# Patient Record
Sex: Female | Born: 1955 | Race: Black or African American | Hispanic: No | Smoking: Former smoker
Health system: Southern US, Community
[De-identification: ages and names within clinical notes are randomized; demographics above are authoritative.]

## PROBLEM LIST (undated history)

## (undated) DIAGNOSIS — Z89611 Acquired absence of right leg above knee: Secondary | ICD-10-CM

## (undated) DIAGNOSIS — E079 Disorder of thyroid, unspecified: Secondary | ICD-10-CM

## (undated) DIAGNOSIS — L97514 Non-pressure chronic ulcer of other part of right foot with necrosis of bone: Secondary | ICD-10-CM

## (undated) DIAGNOSIS — I509 Heart failure, unspecified: Secondary | ICD-10-CM

## (undated) DIAGNOSIS — N186 End stage renal disease: Secondary | ICD-10-CM

## (undated) DIAGNOSIS — K219 Gastro-esophageal reflux disease without esophagitis: Secondary | ICD-10-CM

## (undated) DIAGNOSIS — I639 Cerebral infarction, unspecified: Secondary | ICD-10-CM

## (undated) DIAGNOSIS — N390 Urinary tract infection, site not specified: Secondary | ICD-10-CM

## (undated) DIAGNOSIS — G4700 Insomnia, unspecified: Secondary | ICD-10-CM

## (undated) DIAGNOSIS — I2089 Other forms of angina pectoris: Secondary | ICD-10-CM

## (undated) DIAGNOSIS — D696 Thrombocytopenia, unspecified: Secondary | ICD-10-CM

## (undated) DIAGNOSIS — H109 Unspecified conjunctivitis: Secondary | ICD-10-CM

## (undated) DIAGNOSIS — I2510 Atherosclerotic heart disease of native coronary artery without angina pectoris: Secondary | ICD-10-CM

## (undated) DIAGNOSIS — N3289 Other specified disorders of bladder: Secondary | ICD-10-CM

## (undated) DIAGNOSIS — E119 Type 2 diabetes mellitus without complications: Secondary | ICD-10-CM

## (undated) DIAGNOSIS — T8859XA Other complications of anesthesia, initial encounter: Secondary | ICD-10-CM

## (undated) DIAGNOSIS — M720 Palmar fascial fibromatosis [Dupuytren]: Secondary | ICD-10-CM

## (undated) DIAGNOSIS — Z95828 Presence of other vascular implants and grafts: Secondary | ICD-10-CM

## (undated) DIAGNOSIS — I208 Other forms of angina pectoris: Secondary | ICD-10-CM

## (undated) DIAGNOSIS — E039 Hypothyroidism, unspecified: Secondary | ICD-10-CM

## (undated) DIAGNOSIS — T4145XA Adverse effect of unspecified anesthetic, initial encounter: Secondary | ICD-10-CM

## (undated) DIAGNOSIS — Z9889 Other specified postprocedural states: Secondary | ICD-10-CM

## (undated) DIAGNOSIS — I82409 Acute embolism and thrombosis of unspecified deep veins of unspecified lower extremity: Secondary | ICD-10-CM

## (undated) DIAGNOSIS — D649 Anemia, unspecified: Secondary | ICD-10-CM

## (undated) DIAGNOSIS — E1129 Type 2 diabetes mellitus with other diabetic kidney complication: Secondary | ICD-10-CM

## (undated) DIAGNOSIS — I731 Thromboangiitis obliterans [Buerger's disease]: Secondary | ICD-10-CM

## (undated) DIAGNOSIS — E1152 Type 2 diabetes mellitus with diabetic peripheral angiopathy with gangrene: Secondary | ICD-10-CM

## (undated) DIAGNOSIS — D494 Neoplasm of unspecified behavior of bladder: Secondary | ICD-10-CM

## (undated) DIAGNOSIS — I10 Essential (primary) hypertension: Secondary | ICD-10-CM

## (undated) DIAGNOSIS — E1165 Type 2 diabetes mellitus with hyperglycemia: Secondary | ICD-10-CM

## (undated) DIAGNOSIS — E785 Hyperlipidemia, unspecified: Secondary | ICD-10-CM

## (undated) DIAGNOSIS — Z992 Dependence on renal dialysis: Secondary | ICD-10-CM

## (undated) DIAGNOSIS — I96 Gangrene, not elsewhere classified: Secondary | ICD-10-CM

## (undated) DIAGNOSIS — R112 Nausea with vomiting, unspecified: Secondary | ICD-10-CM

## (undated) HISTORY — DX: Atherosclerotic heart disease of native coronary artery without angina pectoris: I25.10

## (undated) HISTORY — PX: OTHER SURGICAL HISTORY: SHX169

## (undated) HISTORY — PX: CAROTID ENDARTERECTOMY: SUR193

## (undated) HISTORY — DX: Heart failure, unspecified: I50.9

## (undated) HISTORY — PX: CHOLECYSTECTOMY: SHX55

## (undated) HISTORY — DX: Hyperlipidemia, unspecified: E78.5

## (undated) HISTORY — DX: Disorder of thyroid, unspecified: E07.9

## (undated) HISTORY — DX: Acute embolism and thrombosis of unspecified deep veins of unspecified lower extremity: I82.409

## (undated) HISTORY — DX: Thrombocytopenia, unspecified: D69.6

## (undated) HISTORY — PX: FOOT AMPUTATION: SHX951

## (undated) HISTORY — DX: Essential (primary) hypertension: I10

## (undated) HISTORY — DX: Type 2 diabetes mellitus without complications: E11.9

## (undated) HISTORY — DX: Anemia, unspecified: D64.9

## (undated) HISTORY — DX: Urinary tract infection, site not specified: N39.0

---

## 1998-08-24 ENCOUNTER — Emergency Department (HOSPITAL_COMMUNITY): Admission: EM | Admit: 1998-08-24 | Discharge: 1998-08-24 | Payer: Self-pay | Admitting: Emergency Medicine

## 1998-08-25 ENCOUNTER — Emergency Department (HOSPITAL_COMMUNITY): Admission: EM | Admit: 1998-08-25 | Discharge: 1998-08-25 | Payer: Self-pay | Admitting: Emergency Medicine

## 1999-07-22 ENCOUNTER — Inpatient Hospital Stay (HOSPITAL_COMMUNITY): Admission: EM | Admit: 1999-07-22 | Discharge: 1999-07-23 | Payer: Self-pay | Admitting: Emergency Medicine

## 1999-07-22 ENCOUNTER — Encounter: Payer: Self-pay | Admitting: Emergency Medicine

## 1999-07-22 ENCOUNTER — Inpatient Hospital Stay (HOSPITAL_COMMUNITY): Admission: EM | Admit: 1999-07-22 | Discharge: 1999-07-22 | Payer: Self-pay | Admitting: Obstetrics

## 2010-04-12 HISTORY — PX: CORONARY ARTERY BYPASS GRAFT: SHX141

## 2013-11-07 HISTORY — PX: AXILLARY ARTERY - BRACHIAL ARTERY BYPASS GRAFT: SHX893

## 2013-11-13 ENCOUNTER — Ambulatory Visit: Payer: Self-pay | Admitting: Internal Medicine

## 2013-11-30 ENCOUNTER — Other Ambulatory Visit: Payer: Self-pay | Admitting: *Deleted

## 2013-11-30 DIAGNOSIS — Z01810 Encounter for preprocedural cardiovascular examination: Secondary | ICD-10-CM

## 2013-11-30 DIAGNOSIS — N184 Chronic kidney disease, stage 4 (severe): Secondary | ICD-10-CM

## 2013-12-06 ENCOUNTER — Encounter: Payer: Self-pay | Admitting: Vascular Surgery

## 2013-12-12 ENCOUNTER — Encounter: Payer: Self-pay | Admitting: Vascular Surgery

## 2013-12-13 ENCOUNTER — Encounter (HOSPITAL_COMMUNITY): Payer: Self-pay

## 2013-12-13 ENCOUNTER — Ambulatory Visit: Payer: Self-pay | Admitting: Vascular Surgery

## 2013-12-13 ENCOUNTER — Other Ambulatory Visit (HOSPITAL_COMMUNITY): Payer: Self-pay

## 2013-12-14 ENCOUNTER — Ambulatory Visit (HOSPITAL_COMMUNITY)
Admission: RE | Admit: 2013-12-14 | Discharge: 2013-12-14 | Disposition: A | Discharge status: Home / Self Care | Payer: Medicaid Other | Source: Ambulatory Visit | Attending: Surgery | Admitting: Surgery

## 2013-12-14 ENCOUNTER — Encounter (HOSPITAL_COMMUNITY): Payer: Self-pay

## 2013-12-14 ENCOUNTER — Encounter: Payer: Self-pay | Admitting: Surgery

## 2013-12-14 ENCOUNTER — Ambulatory Visit (INDEPENDENT_AMBULATORY_CARE_PROVIDER_SITE_OTHER): Payer: Self-pay | Admitting: Surgery

## 2013-12-14 ENCOUNTER — Other Ambulatory Visit (HOSPITAL_COMMUNITY): Payer: Self-pay

## 2013-12-14 ENCOUNTER — Ambulatory Visit (INDEPENDENT_AMBULATORY_CARE_PROVIDER_SITE_OTHER)
Admission: RE | Admit: 2013-12-14 | Discharge: 2013-12-14 | Disposition: A | Discharge status: Home / Self Care | Payer: Medicaid Other | Source: Ambulatory Visit | Attending: Vascular Surgery | Admitting: Vascular Surgery

## 2013-12-14 ENCOUNTER — Ambulatory Visit: Payer: Self-pay | Admitting: Vascular Surgery

## 2013-12-14 VITALS — BP 182/82 | HR 74 | Ht 66.0 in | Wt 124.7 lb

## 2013-12-14 DIAGNOSIS — Z992 Dependence on renal dialysis: Secondary | ICD-10-CM

## 2013-12-14 DIAGNOSIS — I999 Unspecified disorder of circulatory system: Secondary | ICD-10-CM | POA: Insufficient documentation

## 2013-12-14 DIAGNOSIS — N184 Chronic kidney disease, stage 4 (severe): Secondary | ICD-10-CM | POA: Diagnosis not present

## 2013-12-14 DIAGNOSIS — N186 End stage renal disease: Secondary | ICD-10-CM

## 2013-12-14 DIAGNOSIS — Z01810 Encounter for preprocedural cardiovascular examination: Secondary | ICD-10-CM | POA: Diagnosis present

## 2013-12-14 DIAGNOSIS — I7789 Other specified disorders of arteries and arterioles: Secondary | ICD-10-CM | POA: Insufficient documentation

## 2013-12-14 HISTORY — DX: Dependence on renal dialysis: Z99.2

## 2013-12-14 HISTORY — DX: End stage renal disease: N18.6

## 2013-12-14 NOTE — Progress Notes (Signed)
Patient name: Brandi Arellano MRN: [id number] DOB: November 05, 1955 Sex: female   Referred by: Dr. Romie Levee  Reason for referral:  Chief Complaint  Patient presents with  . New Evaluation    eval Lt arm s/p axillary BP - new perm access     HISTORY OF PRESENT ILLNESS: 58 yo with ESRD, on HD about 2 months ago.  She is right handed.  She had a catheter placed in [place].  She now has dialysis on Tuesday Thursday Saturday.  While at [facility], she had a right axillary to brachial artery bypass graft with propatent external ring graft.  She is found to have very small arteries.  Her renal failure is secondary to diabetes.  She also suffers from hypertension.  Past Medical History  Diagnosis Date  . CAD (coronary artery disease)   . Hypertension   . Diabetes mellitus without complication     Type II  . CKD (chronic kidney disease) stage 4, GFR 15-29 ml/min   . Hyperlipidemia   . CHF (congestive heart failure)     Acute on chronic diastolic  . UTI (lower urinary tract infection)   . Thyroid disease     Abnormal Thyroid function Test  . Anemia   . DVT (deep venous thrombosis)   . Thrombocytopenia     Past Surgical History  Procedure Laterality Date  . Coronary artery bypass graft    . Cholecystectomy    . Carotid endarterectomy      Bilateral  . Foot amputation Bilateral     History   Social History  . Marital Status: Divorced    Spouse Name: N/A    Number of Children: N/A  . Years of Education: N/A   Occupational History  . Not on file.   Social History Main Topics  . Smoking status: Former Smoker -- 15 years    Types: Cigarettes  . Smokeless tobacco: Never Used  . Alcohol Use: No  . Drug Use: No  . Sexual Activity: Not on file   Other Topics Concern  . Not on file   Social History Narrative  . No narrative on file    Family History  Problem Relation Age of Onset  . Hypertension Father   . Heart disease Father     Coronary Artery Disease    Allergies  as of 12/14/2013 - Review Complete 12/14/2013  Allergen Reaction Noted  . Atenolol  12/06/2013  . Diphenhydramine Nausea And Vomiting 12/06/2013  . Iodinated diagnostic agents  12/06/2013  . Metoprolol  12/06/2013  . Morphine and related  12/06/2013  . Thiopental  12/06/2013  . Tramadol hcl Nausea And Vomiting 12/06/2013    Current Outpatient Prescriptions on File Prior to Visit  Medication Sig Dispense Refill  . acetaminophen (TYLENOL) 650 MG suppository Place 650 mg rectally as needed.      Marland Kitchen aspirin 81 MG tablet Take 81 mg by mouth daily.      Marland Kitchen b complex vitamins tablet Take 1 tablet by mouth daily.      . calcium carbonate 200 MG capsule Take 1,000 mg by mouth every 4 (four) hours.      . Electrolyte-A in Dextrose (DEXTROSE 50%/ELECTROLYTES IV) Inject into the vein.      Marland Kitchen glucagon 1 MG injection Inject 1 mg into the vein once as needed.      . heparin 5000 UNIT/ML injection Inject into the skin every 12 (twelve) hours.      . hydrALAZINE (APRESOLINE) 10 MG  tablet Take 10 mg by mouth every 6 (six) hours.      . INSULIN DETEMIR [place] Inject 3 Units into the skin daily.      . iron sucrose in sodium chloride 0.9 % 100 mL Inject into the vein. Dialysis      . ISOSORBIDE MONONITRATE PO Take 30 mg by mouth daily.      Marland Kitchen levothyroxine (SYNTHROID, LEVOTHROID) 25 MCG tablet Take 25 mcg by mouth daily before breakfast.      . MINERAL OIL-HYDROPHIL PETROLAT EX Apply topically 2 (two) times daily.      . ondansetron (ZOFRAN) 4 MG/5ML solution Take by mouth every 8 (eight) hours as needed for nausea or vomiting.      Marland Kitchen PROMETHAZINE HCL IJ Inject 12.5 mg as directed every 8 (eight) hours.      Marland Kitchen rOPINIRole (REQUIP) 1 MG tablet Take 1 mg by mouth at bedtime.       No current facility-administered medications on file prior to visit.     REVIEW OF SYSTEMS: Cardiovascular: No chest pain, chest pressure, palpitations, orthopnea, or dyspnea on exertion. No claudication or rest pain,  No history of  DVT or phlebitis. Pulmonary: No productive cough, asthma or wheezing. Neurologic: No weakness, paresthesias, aphasia, or amaurosis. No dizziness. Hematologic: No bleeding problems or clotting disorders. Musculoskeletal: Left arm pain. Gastrointestinal: No blood in stool or hematemesis Genitourinary: No dysuria or hematuria. Psychiatric:: No history of major depression. Integumentary: No rashes or ulcers. Constitutional: No fever or chills.  PHYSICAL EXAMINATION: General: The patient appears their stated age.  Vital signs are BP 182/82  Pulse 74  Ht 5\' 6"  (1.676 m)  Wt 124 lb 11.2 oz (56.564 kg)  BMI 20.14 kg/m2  SpO2 100% HEENT:  No gross abnormalities Pulmonary: Respirations are non-labored Abdomen: Soft and non-tender  Musculoskeletal: Cannot flex left middle finger Neurologic: No focal weakness or paresthesias are detected, Skin: There are no ulcer or rashes noted. Psychiatric: The patient has normal affect. Cardiovascular: There is a regular rate and rhythm without significant murmur appreciated.  Palpable radial pulses bilaterally  Diagnostic Studies: She does not have visible also vein bilaterally or right basilic vein.  There is a basilic vein on the left with diameter measurements from 0.3-0.4.  She has triphasic waveforms within the left radial and ulnar artery at the wrist.  With a positive Allen test.  Waveforms are monophasic on the right.   Assessment:  End-stage renal disease Plan: Before proceeding with any form of dialysis access, the patient needs better imaging.  I'm going to start with bilateral upper extremity venogram to evaluate her central venous system.  In addition she is going to get bilateral upper extremity angiography.  She has some form of a bypass graft with Gore-Tex in her left arm.  I elected to place any upper extremity graft.  I will more than likely perform lower extremity angiography if I feel that the patient needs a thigh graft.  This is been  scheduled for Wednesday, September 16     V. Leia Alf, M.D. [facility] Office: [phone] Pager:  [phone]

## 2013-12-19 ENCOUNTER — Other Ambulatory Visit: Payer: Self-pay

## 2013-12-26 ENCOUNTER — Observation Stay (HOSPITAL_COMMUNITY)
Admission: RE | Admit: 2013-12-26 | Discharge: 2013-12-27 | Disposition: A | Discharge status: Home / Self Care | Payer: Medicaid Other | Source: Ambulatory Visit | Attending: Surgery | Admitting: Surgery

## 2013-12-26 ENCOUNTER — Encounter (HOSPITAL_COMMUNITY)
Admission: RE | Disposition: A | Discharge status: Home / Self Care | Payer: Self-pay | Source: Ambulatory Visit | Attending: Surgery

## 2013-12-26 ENCOUNTER — Encounter (HOSPITAL_COMMUNITY): Payer: Self-pay | Admitting: *Deleted

## 2013-12-26 DIAGNOSIS — I10 Essential (primary) hypertension: Secondary | ICD-10-CM | POA: Diagnosis present

## 2013-12-26 DIAGNOSIS — I70209 Unspecified atherosclerosis of native arteries of extremities, unspecified extremity: Secondary | ICD-10-CM | POA: Insufficient documentation

## 2013-12-26 DIAGNOSIS — E039 Hypothyroidism, unspecified: Secondary | ICD-10-CM | POA: Diagnosis not present

## 2013-12-26 DIAGNOSIS — N186 End stage renal disease: Secondary | ICD-10-CM | POA: Diagnosis present

## 2013-12-26 DIAGNOSIS — I2510 Atherosclerotic heart disease of native coronary artery without angina pectoris: Secondary | ICD-10-CM | POA: Diagnosis present

## 2013-12-26 DIAGNOSIS — E1129 Type 2 diabetes mellitus with other diabetic kidney complication: Secondary | ICD-10-CM | POA: Diagnosis not present

## 2013-12-26 DIAGNOSIS — Z794 Long term (current) use of insulin: Secondary | ICD-10-CM | POA: Diagnosis not present

## 2013-12-26 DIAGNOSIS — E785 Hyperlipidemia, unspecified: Secondary | ICD-10-CM | POA: Insufficient documentation

## 2013-12-26 DIAGNOSIS — Z87891 Personal history of nicotine dependence: Secondary | ICD-10-CM | POA: Insufficient documentation

## 2013-12-26 DIAGNOSIS — Z86718 Personal history of other venous thrombosis and embolism: Secondary | ICD-10-CM | POA: Insufficient documentation

## 2013-12-26 DIAGNOSIS — I120 Hypertensive chronic kidney disease with stage 5 chronic kidney disease or end stage renal disease: Secondary | ICD-10-CM | POA: Insufficient documentation

## 2013-12-26 DIAGNOSIS — Z7982 Long term (current) use of aspirin: Secondary | ICD-10-CM | POA: Diagnosis not present

## 2013-12-26 DIAGNOSIS — I871 Compression of vein: Secondary | ICD-10-CM | POA: Diagnosis not present

## 2013-12-26 DIAGNOSIS — E1165 Type 2 diabetes mellitus with hyperglycemia: Secondary | ICD-10-CM | POA: Diagnosis not present

## 2013-12-26 DIAGNOSIS — I5032 Chronic diastolic (congestive) heart failure: Secondary | ICD-10-CM | POA: Insufficient documentation

## 2013-12-26 DIAGNOSIS — N189 Chronic kidney disease, unspecified: Secondary | ICD-10-CM | POA: Diagnosis present

## 2013-12-26 DIAGNOSIS — IMO0002 Reserved for concepts with insufficient information to code with codable children: Secondary | ICD-10-CM | POA: Diagnosis present

## 2013-12-26 DIAGNOSIS — I509 Heart failure, unspecified: Secondary | ICD-10-CM | POA: Diagnosis not present

## 2013-12-26 DIAGNOSIS — Z992 Dependence on renal dialysis: Secondary | ICD-10-CM

## 2013-12-26 DIAGNOSIS — N185 Chronic kidney disease, stage 5: Secondary | ICD-10-CM

## 2013-12-26 HISTORY — PX: ABDOMINAL ANGIOGRAM: SHX5499

## 2013-12-26 HISTORY — PX: LOWER EXTREMITY ANGIOGRAM: SHX5508

## 2013-12-26 HISTORY — DX: Type 2 diabetes mellitus with other diabetic kidney complication: E11.29

## 2013-12-26 HISTORY — PX: ARCH AORTOGRAM: SHX5501

## 2013-12-26 HISTORY — PX: UPPER EXTREMITY ANGIOGRAM: SHX6310

## 2013-12-26 HISTORY — DX: Reserved for concepts with insufficient information to code with codable children: IMO0002

## 2013-12-26 LAB — COMPREHENSIVE METABOLIC PANEL
ALBUMIN: 3.3 g/dL — AB (ref 3.5–5.2)
ALK PHOS: 180 U/L — AB (ref 39–117)
ALT: 207 U/L — AB (ref 0–35)
ANION GAP: 19 — AB (ref 5–15)
AST: 318 U/L — ABNORMAL HIGH (ref 0–37)
BILIRUBIN TOTAL: 0.3 mg/dL (ref 0.3–1.2)
BUN: 56 mg/dL — ABNORMAL HIGH (ref 6–23)
CHLORIDE: 89 meq/L — AB (ref 96–112)
CO2: 22 mEq/L (ref 19–32)
Calcium: 8.3 mg/dL — ABNORMAL LOW (ref 8.4–10.5)
Creatinine, Ser: 5.96 mg/dL — ABNORMAL HIGH (ref 0.50–1.10)
GFR calc non Af Amer: 7 mL/min — ABNORMAL LOW (ref >90)
GFR, EST AFRICAN AMERICAN: 8 mL/min — AB (ref >90)
GLUCOSE: 224 mg/dL — AB (ref 70–99)
POTASSIUM: 4.3 meq/L (ref 3.7–5.3)
Sodium: 130 mEq/L — ABNORMAL LOW (ref 137–147)
Total Protein: 7.3 g/dL (ref 6.0–8.3)

## 2013-12-26 LAB — GLUCOSE, RANDOM: GLUCOSE: 538 mg/dL — AB (ref 70–99)

## 2013-12-26 LAB — POCT I-STAT, CHEM 8
BUN: 46 mg/dL — AB (ref 6–23)
CALCIUM ION: 1.02 mmol/L — AB (ref 1.12–1.23)
CHLORIDE: 98 meq/L (ref 96–112)
Creatinine, Ser: 5.7 mg/dL — ABNORMAL HIGH (ref 0.50–1.10)
Glucose, Bld: 272 mg/dL — ABNORMAL HIGH (ref 70–99)
HCT: 47 % — ABNORMAL HIGH (ref 36.0–46.0)
Hemoglobin: 16 g/dL — ABNORMAL HIGH (ref 12.0–15.0)
Potassium: 4.1 mEq/L (ref 3.7–5.3)
Sodium: 136 mEq/L — ABNORMAL LOW (ref 137–147)
TCO2: 25 mmol/L (ref 0–100)

## 2013-12-26 LAB — PHOSPHORUS: Phosphorus: 7.5 mg/dL — ABNORMAL HIGH (ref 2.3–4.6)

## 2013-12-26 LAB — GLUCOSE, CAPILLARY
GLUCOSE-CAPILLARY: 523 mg/dL — AB (ref 70–99)
GLUCOSE-CAPILLARY: 327 mg/dL — AB (ref 70–99)
Glucose-Capillary: 265 mg/dL — ABNORMAL HIGH (ref 70–99)
Glucose-Capillary: 250 mg/dL — ABNORMAL HIGH (ref 70–99)
Glucose-Capillary: 497 mg/dL — ABNORMAL HIGH (ref 70–99)
Glucose-Capillary: 370 mg/dL — ABNORMAL HIGH (ref 70–99)
Glucose-Capillary: 200 mg/dL — ABNORMAL HIGH (ref 70–99)

## 2013-12-26 LAB — TSH: TSH: 2.07 u[IU]/mL (ref 0.350–4.500)

## 2013-12-26 LAB — MAGNESIUM: Magnesium: 2.1 mg/dL (ref 1.5–2.5)

## 2013-12-26 SURGERY — ANGIOGRAM, LOWER EXTREMITY
Anesthesia: LOCAL | Laterality: Right

## 2013-12-26 MED ORDER — LEVOTHYROXINE SODIUM 25 MCG PO TABS
25.0000 ug | ORAL_TABLET | Freq: Every day | ORAL | Status: DC
  Filled 2013-12-26: qty 1

## 2013-12-26 MED ORDER — ROPINIROLE HCL 1 MG PO TABS
1.0000 mg | ORAL_TABLET | Freq: Every day | ORAL | Status: DC
  Filled 2013-12-26: qty 1

## 2013-12-26 MED ORDER — SODIUM CHLORIDE 0.9 % IJ SOLN: 3.0000 mL | INTRAMUSCULAR | Status: DC | PRN

## 2013-12-26 MED ORDER — INSULIN ASPART 100 UNIT/ML ~~LOC~~ SOLN
SUBCUTANEOUS | Status: AC
  Filled 2013-12-26: qty 1

## 2013-12-26 MED ORDER — PANTOPRAZOLE SODIUM 40 MG PO TBEC: 40.0000 mg | DELAYED_RELEASE_TABLET | Freq: Every day | ORAL | Status: DC

## 2013-12-26 MED ORDER — METHYLPREDNISOLONE SODIUM SUCC 125 MG IJ SOLR
125.0000 mg | INTRAMUSCULAR | Status: AC
Start: 2013-12-26 — End: 2013-12-26
  Administered 2013-12-26: 125 mg via INTRAVENOUS

## 2013-12-26 MED ORDER — SODIUM CHLORIDE 0.9 % IJ SOLN
3.0000 mL | Freq: Two times a day (BID) | INTRAMUSCULAR | Status: DC
  Administered 2013-12-26 – 2013-12-27 (×2): 3 mL via INTRAVENOUS

## 2013-12-26 MED ORDER — ASPIRIN EC 81 MG PO TBEC
81.0000 mg | DELAYED_RELEASE_TABLET | Freq: Every day | ORAL | Status: DC
  Filled 2013-12-26 (×2): qty 1

## 2013-12-26 MED ORDER — HYDRALAZINE HCL 10 MG PO TABS
10.0000 mg | ORAL_TABLET | Freq: Once | ORAL | Status: AC
  Administered 2013-12-26: 10 mg via ORAL
  Filled 2013-12-26: qty 1

## 2013-12-26 MED ORDER — FENTANYL CITRATE 0.05 MG/ML IJ SOLN
INTRAMUSCULAR | Status: AC
  Filled 2013-12-26: qty 2

## 2013-12-26 MED ORDER — METHYLPREDNISOLONE SODIUM SUCC 125 MG IJ SOLR
INTRAMUSCULAR | Status: AC
  Filled 2013-12-26: qty 2

## 2013-12-26 MED ORDER — LEVOTHYROXINE SODIUM 88 MCG PO TABS
88.0000 ug | ORAL_TABLET | Freq: Every day | ORAL | Status: DC
  Administered 2013-12-27: 88 ug via ORAL
  Filled 2013-12-26 (×2): qty 1

## 2013-12-26 MED ORDER — ONDANSETRON HCL 4 MG/2ML IJ SOLN: 4.0000 mg | Freq: Four times a day (QID) | INTRAMUSCULAR | Status: DC | PRN

## 2013-12-26 MED ORDER — HYDROMORPHONE HCL 1 MG/ML IJ SOLN: 0.5000 mg | INTRAMUSCULAR | Status: DC | PRN

## 2013-12-26 MED ORDER — MIDAZOLAM HCL 2 MG/2ML IJ SOLN
INTRAMUSCULAR | Status: AC
  Filled 2013-12-26: qty 2

## 2013-12-26 MED ORDER — PHENOL 1.4 % MT LIQD: 1.0000 | OROMUCOSAL | Status: DC | PRN

## 2013-12-26 MED ORDER — SODIUM CHLORIDE 0.9 % IV SOLN: 250.0000 mL | INTRAVENOUS | Status: DC | PRN

## 2013-12-26 MED ORDER — HYDRALAZINE HCL 10 MG PO TABS
10.0000 mg | ORAL_TABLET | Freq: Four times a day (QID) | ORAL | Status: DC
  Filled 2013-12-26 (×3): qty 1

## 2013-12-26 MED ORDER — ACETAMINOPHEN 325 MG RE SUPP
325.0000 mg | RECTAL | Status: DC | PRN
  Filled 2013-12-26: qty 2

## 2013-12-26 MED ORDER — INSULIN ASPART 100 UNIT/ML ~~LOC~~ SOLN
0.0000 [IU] | SUBCUTANEOUS | Status: DC
  Administered 2013-12-26: 3 [IU] via SUBCUTANEOUS
  Administered 2013-12-27: 8 [IU] via SUBCUTANEOUS

## 2013-12-26 MED ORDER — FAMOTIDINE IN NACL 20-0.9 MG/50ML-% IV SOLN
INTRAVENOUS | Status: AC
  Administered 2013-12-26: 20 mg via INTRAVENOUS
  Filled 2013-12-26: qty 50

## 2013-12-26 MED ORDER — HYDRALAZINE HCL 20 MG/ML IJ SOLN: 10.0000 mg | INTRAMUSCULAR | Status: DC | PRN

## 2013-12-26 MED ORDER — INSULIN ASPART 100 UNIT/ML ~~LOC~~ SOLN
15.0000 [IU] | Freq: Once | SUBCUTANEOUS | Status: AC
  Administered 2013-12-26: 15 [IU] via SUBCUTANEOUS

## 2013-12-26 MED ORDER — HYDRALAZINE HCL 10 MG PO TABS
10.0000 mg | ORAL_TABLET | Freq: Three times a day (TID) | ORAL | Status: DC
  Administered 2013-12-27 (×2): 10 mg via ORAL
  Filled 2013-12-26 (×4): qty 1

## 2013-12-26 MED ORDER — HEPARIN (PORCINE) IN NACL 2-0.9 UNIT/ML-% IJ SOLN
INTRAMUSCULAR | Status: AC
  Filled 2013-12-26: qty 500

## 2013-12-26 MED ORDER — ACETAMINOPHEN 325 MG PO TABS: 325.0000 mg | ORAL_TABLET | ORAL | Status: DC | PRN

## 2013-12-26 MED ORDER — POTASSIUM CHLORIDE CRYS ER 20 MEQ PO TBCR: 20.0000 meq | EXTENDED_RELEASE_TABLET | Freq: Once | ORAL | Status: DC

## 2013-12-26 MED ORDER — ENOXAPARIN SODIUM 40 MG/0.4ML ~~LOC~~ SOLN: 40.0000 mg | SUBCUTANEOUS | Status: DC

## 2013-12-26 MED ORDER — FAMOTIDINE IN NACL 20-0.9 MG/50ML-% IV SOLN
20.0000 mg | INTRAVENOUS | Status: AC
  Administered 2013-12-26: 20 mg via INTRAVENOUS

## 2013-12-26 MED ORDER — LIDOCAINE HCL (PF) 1 % IJ SOLN
INTRAMUSCULAR | Status: AC
  Filled 2013-12-26: qty 30

## 2013-12-26 MED ORDER — ISOSORBIDE DINITRATE 30 MG PO TABS
30.0000 mg | ORAL_TABLET | Freq: Once | ORAL | Status: AC
  Administered 2013-12-26: 30 mg via ORAL
  Filled 2013-12-26: qty 1

## 2013-12-26 MED ORDER — ALUM & MAG HYDROXIDE-SIMETH 200-200-20 MG/5ML PO SUSP: 15.0000 mL | ORAL | Status: DC | PRN

## 2013-12-26 MED ORDER — INSULIN DETEMIR 100 UNIT/ML ~~LOC~~ SOLN
3.0000 [IU] | Freq: Every day | SUBCUTANEOUS | Status: DC
  Administered 2013-12-26: 3 [IU] via SUBCUTANEOUS
  Filled 2013-12-26 (×2): qty 0.03

## 2013-12-26 NOTE — Interval H&P Note (Signed)
History and Physical Interval Note:  12/26/2013 10:46 AM  Brandi Arellano  has presented today for surgery, with the diagnosis of END STAGE RENAL  The various methods of treatment have been discussed with the patient and family. After consideration of risks, benefits and other options for treatment, the patient has consented to  Procedure(s): BILATERAL UPPER EXTREMITY ANGIOGRAM (N/A) LOWER EXTREMITY ANGIOGRAM (N/A) as a surgical intervention .  The patient's history has been reviewed, patient examined, no change in status, stable for surgery.  I have reviewed the patient's chart and labs.  Questions were answered to the patient's satisfaction.     Garo Heidelberg IV, V. [facility]

## 2013-12-26 NOTE — H&P (View-Only) (Signed)
Patient name: Brandi Arellano MRN: [id number] DOB: 1955/09/01 Sex: female   Referred by: Dr. Romie Levee  Reason for referral:  Chief Complaint  Patient presents with  . New Evaluation    eval Lt arm s/p axillary BP - new perm access     HISTORY OF PRESENT ILLNESS: 58 yo with ESRD, on HD about 2 months ago.  She is right handed.  She had a catheter placed in [place].  She now has dialysis on Tuesday Thursday Saturday.  While at [facility], she had a right axillary to brachial artery bypass graft with propatent external ring graft.  She is found to have very small arteries.  Her renal failure is secondary to diabetes.  She also suffers from hypertension.  Past Medical History  Diagnosis Date  . CAD (coronary artery disease)   . Hypertension   . Diabetes mellitus without complication     Type II  . CKD (chronic kidney disease) stage 4, GFR 15-29 ml/min   . Hyperlipidemia   . CHF (congestive heart failure)     Acute on chronic diastolic  . UTI (lower urinary tract infection)   . Thyroid disease     Abnormal Thyroid function Test  . Anemia   . DVT (deep venous thrombosis)   . Thrombocytopenia     Past Surgical History  Procedure Laterality Date  . Coronary artery bypass graft    . Cholecystectomy    . Carotid endarterectomy      Bilateral  . Foot amputation Bilateral     History   Social History  . Marital Status: Divorced    Spouse Name: N/A    Number of Children: N/A  . Years of Education: N/A   Occupational History  . Not on file.   Social History Main Topics  . Smoking status: Former Smoker -- 15 years    Types: Cigarettes  . Smokeless tobacco: Never Used  . Alcohol Use: No  . Drug Use: No  . Sexual Activity: Not on file   Other Topics Concern  . Not on file   Social History Narrative  . No narrative on file    Family History  Problem Relation Age of Onset  . Hypertension Father   . Heart disease Father     Coronary Artery Disease    Allergies  as of 12/14/2013 - Review Complete 12/14/2013  Allergen Reaction Noted  . Atenolol  12/06/2013  . Diphenhydramine Nausea And Vomiting 12/06/2013  . Iodinated diagnostic agents  12/06/2013  . Metoprolol  12/06/2013  . Morphine and related  12/06/2013  . Thiopental  12/06/2013  . Tramadol hcl Nausea And Vomiting 12/06/2013    Current Outpatient Prescriptions on File Prior to Visit  Medication Sig Dispense Refill  . acetaminophen (TYLENOL) 650 MG suppository Place 650 mg rectally as needed.      Marland Kitchen aspirin 81 MG tablet Take 81 mg by mouth daily.      Marland Kitchen b complex vitamins tablet Take 1 tablet by mouth daily.      . calcium carbonate 200 MG capsule Take 1,000 mg by mouth every 4 (four) hours.      . Electrolyte-A in Dextrose (DEXTROSE 50%/ELECTROLYTES IV) Inject into the vein.      Marland Kitchen glucagon 1 MG injection Inject 1 mg into the vein once as needed.      . heparin 5000 UNIT/ML injection Inject into the skin every 12 (twelve) hours.      . hydrALAZINE (APRESOLINE) 10 MG  tablet Take 10 mg by mouth every 6 (six) hours.      . INSULIN DETEMIR [place] Inject 3 Units into the skin daily.      . iron sucrose in sodium chloride 0.9 % 100 mL Inject into the vein. Dialysis      . ISOSORBIDE MONONITRATE PO Take 30 mg by mouth daily.      Marland Kitchen levothyroxine (SYNTHROID, LEVOTHROID) 25 MCG tablet Take 25 mcg by mouth daily before breakfast.      . MINERAL OIL-HYDROPHIL PETROLAT EX Apply topically 2 (two) times daily.      . ondansetron (ZOFRAN) 4 MG/5ML solution Take by mouth every 8 (eight) hours as needed for nausea or vomiting.      Marland Kitchen PROMETHAZINE HCL IJ Inject 12.5 mg as directed every 8 (eight) hours.      Marland Kitchen rOPINIRole (REQUIP) 1 MG tablet Take 1 mg by mouth at bedtime.       No current facility-administered medications on file prior to visit.     REVIEW OF SYSTEMS: Cardiovascular: No chest pain, chest pressure, palpitations, orthopnea, or dyspnea on exertion. No claudication or rest pain,  No history of  DVT or phlebitis. Pulmonary: No productive cough, asthma or wheezing. Neurologic: No weakness, paresthesias, aphasia, or amaurosis. No dizziness. Hematologic: No bleeding problems or clotting disorders. Musculoskeletal: Left arm pain. Gastrointestinal: No blood in stool or hematemesis Genitourinary: No dysuria or hematuria. Psychiatric:: No history of major depression. Integumentary: No rashes or ulcers. Constitutional: No fever or chills.  PHYSICAL EXAMINATION: General: The patient appears their stated age.  Vital signs are BP 182/82  Pulse 74  Ht 5\' 6"  (1.676 m)  Wt 124 lb 11.2 oz (56.564 kg)  BMI 20.14 kg/m2  SpO2 100% HEENT:  No gross abnormalities Pulmonary: Respirations are non-labored Abdomen: Soft and non-tender  Musculoskeletal: Cannot flex left middle finger Neurologic: No focal weakness or paresthesias are detected, Skin: There are no ulcer or rashes noted. Psychiatric: The patient has normal affect. Cardiovascular: There is a regular rate and rhythm without significant murmur appreciated.  Palpable radial pulses bilaterally  Diagnostic Studies: She does not have visible also vein bilaterally or right basilic vein.  There is a basilic vein on the left with diameter measurements from 0.3-0.4.  She has triphasic waveforms within the left radial and ulnar artery at the wrist.  With a positive Allen test.  Waveforms are monophasic on the right.   Assessment:  End-stage renal disease Plan: Before proceeding with any form of dialysis access, the patient needs better imaging.  I'm going to start with bilateral upper extremity venogram to evaluate her central venous system.  In addition she is going to get bilateral upper extremity angiography.  She has some form of a bypass graft with Gore-Tex in her left arm.  I elected to place any upper extremity graft.  I will more than likely perform lower extremity angiography if I feel that the patient needs a thigh graft.  This is been  scheduled for Wednesday, September 16     V. Leia Alf, M.D. [facility] [place] Office: [phone] Pager:  [phone]

## 2013-12-26 NOTE — Op Note (Signed)
Patient name: Brandi Arellano MRN: [id number] DOB: [birth year] Sex: female  12/26/2013 Pre-operative Diagnosis: End-stage renal disease Post-operative diagnosis:  Same Surgeon:  Eldridge Abrahams Procedure Performed:  1.  bilateral upper extremity venogram  2.  ultrasound-guided access, right femoral artery  3.  aortic arch angiogram  4.  third order catheterization  5.  right upper extremity runoff  6.  abdominal aortogram  7.  bilateral lower extremity runoff   Indications:  The patient has severe vascular disease.  She comes in today for access planning  Procedure:  The patient was identified in the holding area and taken to room 8.  The patient was then placed supine on the table and prepped and draped in the usual sterile fashion.  A time out was called.  Contrast injections through bilateral peripheral placed IVs were performed for the venogram.  Ultrasound was used to evaluate the right common femoral artery.  It was patent .  A digital ultrasound image was acquired.  A micropuncture needle was used to access the right common femoral artery under ultrasound guidance.  An 018 wire was advanced without resistance and a micropuncture sheath was placed.  The 018 wire was removed and a benson wire was placed.  The micropuncture sheath was exchanged for a 5 french sheath.  A pigtail catheter was advanced into the a sitting aorta and an aortic arch and gram was performed.  Next using a Berenstein 2 catheter, the right axillary artery was selected and a right arm angiogram was performed.  Next, the omniflush catheter was taken to the level of L-1.  An abdominal angiogram was obtained.  The catheter was then pulled down to the aortic bifurcation and bilateral runoff was obtained  Findings:   Abdominal Aortogram:  The infrarenal abdominal aorta is heavily calcified but patent without significant stenosis.  There is no renal artery stenosis.  Bilateral iliac arteries are widely patent but  small in caliber.  Right Lower Extremity:  The common femoral artery is patent.  There is occlusion of the superficial femoral and profunda femoral artery at the origin.  Diffuse collateralization was present throughout the rest of the leg without opacification of a name vessel.  This gives the appearance of [name].  Left Lower Extremity:  Left common femoral artery is patent throughout it's course.  The profunda femoral artery is small in caliber and diffusely diseased but patent.  The superficial  femoral artery is patent down to the adductor canal.  Diffuse collateralization was present throughout the lower extremity.  There is no opacification of the native tibial vessel   Aortic arch: The aortic arch is patent throughout it's course.  There is a bovine arch.  The innominate artery is patent.  The proximal right subclavian artery is patent without disease.  The right carotid artery status post endarterectomy.  There is occlusion of the external carotid artery.  The internal carotid artery and patch repair.  He widely patent.  The left common carotid artery is patent.  There are signs of surgical intervention to the left carotid artery.  There is a stent at the origin of the left subclavian artery.  There appears to be occlusion of the mid left axillary artery and a bypass graft originating just proximal to the occlusion which is widely patent.  Right upper extremity: The right axillary artery is patent.  The distal axillary and proximal brachial artery showed diffuse stenoses.  The brachial artery and the arm appears to be  patent without significant stenosis however it is narrowed.  The ulnar artery does not opacify.  He radial artery is small in caliber.   Central venogram: Venous structures are patent in the right arm however because of contrast extravasation there is no visualization of contrast beyond the existing catheter in the right neck.  There does appear to be central venous doses  within the left subclavian vein.  Intervention:   none  Impression:  #1  1 bovine aortic arch.  Left subclavian artery stent appears to be patent as is the origin of the bypass graft from the left axillary artery.  There is early stenosis in the right axillary/proximal brachial artery.  The ulnar artery is occluded and the right radial artery is very small in caliber.  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  #4  Significant diffuse stenosis throughout the left profunda and superficial femoral artery.  No named tibial vessels opacified.  #5  The right central venous system was not well evaluated due to contrast extravasation from a peripheral IV on the right.  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 Annamarie Major, M.D. [facility] [place] Office: [phone] Pager:  [phone]

## 2013-12-26 NOTE — Progress Notes (Signed)
Neuro assessment WNL. Speech clear and appropriate. A & O. PERRL. Facial smile symmetrical. Fingers on lt hand disfigured d/t arthritis; grips w/both hand. Has had toes amputated from both feet, moves with equal strength.

## 2013-12-26 NOTE — Progress Notes (Signed)
MAUREEN,PA NOTIFIED OF B/P AND ORDERS NOTED

## 2013-12-26 NOTE — Consult Note (Signed)
[facility] Medical Consultation  Ayari Liwanag YIR:[id number] DOB: 1955-09-19 DOA: 12/26/2013 PCP: No primary provider on file.   Requesting physician: Trula Slade Date of consultation: 12/26/13 Reason for consultation: uncontrolled diabetes, uncontrolled hypertension  Impression/Recommendations Principal Problem:   DM type 2, uncontrolled, with renal complications: uncontrolled due to having run out of medications.  BMET pending. Resume levemir. Will likely need uptitration. SSI novolog.  Needs CM consult to find PCP, likely [facility], medications. Check hgb A1C. Active Problems:   End stage renal disease   Essential hypertension, malignant: uncontrolled due to running out of medications. better resuming Imdur and hydralazine. Monitor and adjust.   Unspecified hypothyroidism: no need to check TSH, as will undoubtedly be high off syntroid. Resume.   CAD (coronary artery disease) stable.  [facility] to follow. Thank you for this consultation.  Chief Complaint: high blood sugar, high blood pressure  HPI:  58 yo female with ESRD, DM, HTN had aortic arch angiogram, abdominal aortogram with BLE runoff.  After procedure, had severely elevated blood pressures (180s/100s), as well as CBG increasing to >500. BMET pending. Patient has been admitted to [facility] for observation/stabilization of malignant hypertension and uncontrolled diabetes. Hospitalists asked to consult for assistance with these problems. Patient has been out of all of her medications for several weeks now.  It is unclear to me what she is supposed to be on. She recently moved from [place] to [place] to live with her brother, and is trying to establish a new primary care provider.  After 10 mg IV hydralazine, blood pressure 104/59. After 15 units novolog, CBG 327.   Review of Systems:  Systems reviewed. As above, otherwise negative.  Past Medical History  Diagnosis Date  . CAD (coronary artery disease)   . Hypertension   .  Diabetes mellitus without complication     Type II  . CKD (chronic kidney disease) stage 4, GFR 15-29 ml/min   . Hyperlipidemia   . CHF (congestive heart failure)     Acute on chronic diastolic  . UTI (lower urinary tract infection)   . Thyroid disease     Abnormal Thyroid function Test  . Anemia   . DVT (deep venous thrombosis)   . Thrombocytopenia    Past Surgical History  Procedure Laterality Date  . Coronary artery bypass graft    . Cholecystectomy    . Carotid endarterectomy      Bilateral  . Foot amputation Bilateral    Social History:  reports that she has quit smoking. Her smoking use included Cigarettes. She smoked 0.00 packs per day for 15 years. She has never used smokeless tobacco. She reports that she does not drink alcohol or use illicit drugs.  Allergies  Allergen Reactions  . Atenolol   . Diphenhydramine Nausea And Vomiting    Does not stay in stomach  . Iodinated Diagnostic Agents   . Metoprolol   . Morphine And Related   . Thiopental   . Tramadol Hcl Nausea And Vomiting    Does not stay in stomach   Family History  Problem Relation Age of Onset  . Hypertension Father   . Heart disease Father     Coronary Artery Disease    Prior to Admission medications   Medication Sig Start Date End Date Taking? Authorizing Provider  aspirin EC 81 MG tablet Take 81 mg by mouth daily.   Yes Historical Provider, MD  glucagon 1 MG injection Inject 1 mg into the vein once as needed (for emergency).  Yes Historical Provider, MD  ondansetron (ZOFRAN) 4 MG/5ML solution Take by mouth every 8 (eight) hours as needed for nausea or vomiting.   Yes Historical Provider, MD  rOPINIRole (REQUIP) 1 MG tablet Take 1 mg by mouth at bedtime.   Yes Historical Provider, MD  acetaminophen (TYLENOL) 650 MG suppository Place 650 mg rectally as needed.    Historical Provider, MD  b complex vitamins tablet Take 1 tablet by mouth daily.    Historical Provider, MD  calcium carbonate 200 MG  capsule Take 1,000 mg by mouth every 4 (four) hours.    Historical Provider, MD  Electrolyte-A in Dextrose (DEXTROSE 50%/ELECTROLYTES IV) Inject into the vein.    Historical Provider, MD  heparin 5000 UNIT/ML injection Inject into the skin every 12 (twelve) hours.    Historical Provider, MD  hydrALAZINE (APRESOLINE) 10 MG tablet Take 10 mg by mouth every 6 (six) hours.    Historical Provider, MD  insulin detemir (LEVEMIR) 100 UNIT/ML injection Inject 3 Units into the skin at bedtime.    Historical Provider, MD  iron sucrose in sodium chloride 0.9 % 100 mL Inject into the vein. Dialysis    Historical Provider, MD  ISOSORBIDE MONONITRATE PO Take 30 mg by mouth daily.    Historical Provider, MD  levothyroxine (SYNTHROID, LEVOTHROID) 25 MCG tablet Take 25 mcg by mouth daily before breakfast.    Historical Provider, MD  MINERAL OIL-HYDROPHIL PETROLAT EX Apply topically 2 (two) times daily.    Historical Provider, MD  PROMETHAZINE HCL IJ Inject 12.5 mg as directed every 8 (eight) hours.    Historical Provider, MD   Physical Exam: Blood pressure 100/64, pulse 70, temperature 98.2 F (36.8 C), temperature source Oral, resp. rate 19, height 5\' 6"  (1.676 m), weight 56.7 kg (125 lb), SpO2 96.00%. Filed Vitals:   12/26/13 1633  BP: 100/64  Pulse: 70  Temp:   Resp:    BP 104/59  Pulse 66  Temp(Src) 98.2 F (36.8 C) (Oral)  Resp 19  Ht 5\' 6"  (1.676 m)  Wt 56.7 kg (125 lb)  BMI 20.19 kg/m2  SpO2 99%  General Appearance:    Alert, guarded, occasionally confrontational  Head:    Normocephalic, without obvious abnormality, atraumatic  Eyes:    PERRL, conjunctiva/corneas clear, EOM's intact, brow with excoriations     Nose:   Nares normal, septum midline, mucosa normal, no drainage    or sinus tenderness  Throat:   Poor dentition, MMM. OP without erythema or exudate  Neck:   Supple, symmetrical, trachea midline, no adenopathy;    thyroid:  no enlargement/tenderness/nodules; no carotid   bruit or  JVD  Back:     Symmetric, no curvature, ROM normal, no CVA tenderness  Lungs:     Clear to auscultation bilaterally, respirations unlabored  Chest Wall:    No tenderness or deformity   Heart:    Regular rate and rhythm, S1 and S2 normal, no murmur, rub   or gallop  Abdomen:     Soft, non-tender, bowel sounds active all four quadrants,    no masses, no organomegaly  Genitalia:    deferred  Rectal:    deferred  Extremities:   Bilateral amputations feet, no cyanosis or edema  Pulses:   2+ and symmetric all extremities  Skin:   Skin color, texture, turgor normal, no rashes or lesions  Lymph nodes:   Cervical, supraclavicular, and axillary nodes normal  Neurologic:   CNII-XII intact, normal strength, sensation and reflexes  throughout    Psychiatric: occasionally agitated, confrontational, easily calmed, redirected  Labs on Admission:  Basic Metabolic Panel:  Recent Labs Lab 12/26/13 0928 12/26/13 1600  NA 136*  --   K 4.1  --   CL 98  --   GLUCOSE 272* 538*  BUN 46*  --   CREATININE 5.70*  --    Liver Function Tests: No results found for this basename: AST, ALT, ALKPHOS, BILITOT, PROT, ALBUMIN,  in the last 168 hours No results found for this basename: LIPASE, AMYLASE,  in the last 168 hours No results found for this basename: AMMONIA,  in the last 168 hours CBC:  Recent Labs Lab 12/26/13 0928  HGB 16.0*  HCT 47.0*   Cardiac Enzymes: No results found for this basename: CKTOTAL, CKMB, CKMBINDEX, TROPONINI,  in the last 168 hours BNP: No components found with this basename: POCBNP,  CBG:  Recent Labs Lab 12/26/13 0902 12/26/13 1237 12/26/13 1559 12/26/13 1633  GLUCAP 265* 250* 497* 523*    Radiological Exams on Admission: No results found.  EKG: Normal sinus rhythm Right atrial enlargement Left ventricular hypertrophy with repolarization abnormality  Delfina Redwood, MD [facility] Pager [phone]  If 7PM-7AM, please contact  night-coverage [email or web address] Password [email or web address] 12/26/2013, 5:34 PM

## 2013-12-26 NOTE — Progress Notes (Signed)
Site area: rt groin Site Prior to Removal:  Level 0 Pressure Applied For: 20 minutes Manual:   yes Patient Status During Pull:  stable Post Pull Site:  Level 0 Post Pull Instructions Given:  yes Post Pull Pulses Present: yes Dressing Applied:  yes Bedrest begins @ 1300 Comments: no complications

## 2013-12-26 NOTE — Progress Notes (Signed)
MAUREEN,PA NOTIFIED OF CBG 370 AND NO NEW ORDERS NOTED AT PRESENT; REPORT CALLED TO NURSE FOR 2-W-33 AND TRANSFERRED VIA BED

## 2013-12-26 NOTE — Progress Notes (Signed)
Pt received into room 2w33, pt in bed with MD at bedside, tele placed on pt, brother at bedside, vitals taken, will continue to monitor closely Rickard Rhymes

## 2013-12-27 DIAGNOSIS — E1129 Type 2 diabetes mellitus with other diabetic kidney complication: Secondary | ICD-10-CM | POA: Diagnosis not present

## 2013-12-27 DIAGNOSIS — E1165 Type 2 diabetes mellitus with hyperglycemia: Secondary | ICD-10-CM | POA: Diagnosis not present

## 2013-12-27 LAB — GLUCOSE, CAPILLARY
GLUCOSE-CAPILLARY: 257 mg/dL — AB (ref 70–99)
Glucose-Capillary: 107 mg/dL — ABNORMAL HIGH (ref 70–99)
Glucose-Capillary: 97 mg/dL (ref 70–99)

## 2013-12-27 LAB — HEMOGLOBIN A1C
Hgb A1c MFr Bld: 9.1 % — ABNORMAL HIGH (ref <5.7)
Mean Plasma Glucose: 214 mg/dL — ABNORMAL HIGH (ref <117)

## 2013-12-27 MED ORDER — GLUCOSE BLOOD VI STRP: ORAL_STRIP | Status: DC

## 2013-12-27 MED ORDER — INSULIN ASPART 100 UNIT/ML ~~LOC~~ SOLN: 0.0000 [IU] | Freq: Three times a day (TID) | SUBCUTANEOUS | Status: DC

## 2013-12-27 MED ORDER — ALTERNATE SITE LANCING DEVICE MISC: Status: DC

## 2013-12-27 MED ORDER — HYDRALAZINE HCL 50 MG PO TABS
50.0000 mg | ORAL_TABLET | Freq: Three times a day (TID) | ORAL | Status: DC
  Administered 2013-12-27: 50 mg via ORAL
  Filled 2013-12-27 (×3): qty 1

## 2013-12-27 MED ORDER — INSULIN DETEMIR 100 UNIT/ML ~~LOC~~ SOLN: 3.0000 [IU] | Freq: Every day | SUBCUTANEOUS | Status: DC

## 2013-12-27 MED ORDER — HYDRALAZINE HCL 50 MG PO TABS: 50.0000 mg | ORAL_TABLET | Freq: Three times a day (TID) | ORAL | Status: DC

## 2013-12-27 MED ORDER — SYRINGE (DISPOSABLE) 1 ML MISC: 1.0000 mL | Freq: Three times a day (TID) | Status: DC

## 2013-12-27 NOTE — Progress Notes (Signed)
    Subjective  - Doing well this am.   Objective 179/69 74 97.7 F (36.5 C) (Oral) 18 96%  Intake/Output Summary (Last 24 hours) at 12/27/13 1232 Last data filed at 12/27/13 0119  Gross per 24 hour  Intake    320 ml  Output      1 ml  Net    319 ml    Right groin soft without hematoma Feet well perfused  Assessment/Planning: POD #1 Surgeon: Eldridge Abrahams  Procedure Performed:  1. bilateral upper extremity venogram  2. ultrasound-guided access, right femoral artery  3. aortic arch angiogram  4. third order catheterization  5. right upper extremity runoff  6. abdominal aortogram  7. bilateral lower extremity runoff  She reports needing home medication refills and she does not have a primary care doctor currently in [place]. I called Dr. Romie Levee Who referred her to [place].  His office and the dialysis center social worker will aide her in finding a primary care physician.  I wrote for her Insulin, insulin supplies and Hydralazine BP med's at discharge for 1 month.  Dr. Trula Slade set up her f/u appointment.        Laurence Slate [facility] 12/27/2013 12:32 PM --  Laboratory Lab Results:  Recent Labs  12/26/13 0928  HGB 16.0*  HCT 47.0*   BMET  Recent Labs  12/26/13 0928 12/26/13 1600 12/26/13 1920  NA 136*  --  130*  K 4.1  --  4.3  CL 98  --  89*  CO2  --   --  22  GLUCOSE 272* 538* 224*  BUN 46*  --  56*  CREATININE 5.70*  --  5.96*  CALCIUM  --   --  8.3*    COAG No results found for this basename: INR, PROTIME   No results found for this basename: PTT

## 2013-12-27 NOTE — Discharge Summary (Signed)
I agree with the above  [name] 

## 2013-12-27 NOTE — Discharge Summary (Signed)
I agree with the above  Brandi Arellano 

## 2013-12-27 NOTE — Discharge Summary (Signed)
[facility] Discharge Summary   Patient ID:  Brandi Arellano MRN: [id number] DOB/AGE: 08/16/1955 58 y.o.  Admit date: 12/26/2013 Discharge date: 12/27/2013 Date of Surgery: 12/26/2013 Surgeon: Surgeon(s): Serafina Swoveland, MD  Admission Diagnosis: END STAGE RENAL  Discharge Diagnoses:  END STAGE RENAL  Secondary Diagnoses: Past Medical History  Diagnosis Date  . CAD (coronary artery disease)   . Hypertension   . Diabetes mellitus without complication     Type II  . CKD (chronic kidney disease) stage 4, GFR 15-29 ml/Arellano   . Hyperlipidemia   . CHF (congestive heart failure)     Acute on chronic diastolic  . UTI (lower urinary tract infection)   . Thyroid disease     Abnormal Thyroid function Test  . Anemia   . DVT (deep venous thrombosis)   . Thrombocytopenia     Procedure(s): LOWER EXTREMITY ANGIOGRAM ABDOMINAL ANGIOGRAM UPPER EXTREMITY ANGIOGRAM ARCH AORTOGRAM VENOGRAM EXTREMITY BILATERAL  Discharged Condition: good  HPI: 58 yo with ESRD, on HD about 2 months ago.  We were consulted to place permanent acces for dialysis.  She dialysis T-TH-Sat.  She does not have visible also vein bilaterally or right basilic vein. There is a basilic vein on the left with diameter measurements from 0.3-0.4. She has triphasic waveforms within the left radial and ulnar artery at the wrist. With a positive Allen test. Waveforms are monophasic on the right.  Before proceeding with any form of dialysis access, the patient needs better imaging. I'm going to start with bilateral upper extremity venogram to evaluate her central venous system. In addition she is going to get bilateral upper extremity angiography.     Hospital Course:  Brandi Arellano is a 58 y.o. female is S/P  Procedure(s): LOWER EXTREMITY ANGIOGRAM ABDOMINAL ANGIOGRAM UPPER EXTREMITY ANGIOGRAM ARCH AORTOGRAM VENOGRAM EXTREMITY BILATERAL  Consults:  Treatment Team:  Delfina Redwood,  MD  Significant Diagnostic Studies: CBC Lab Results  Component Value Date   HGB 16.0* 12/26/2013   HCT 47.0* 12/26/2013    BMET    Component Value Date/Time   NA 130* 12/26/2013 1920   K 4.3 12/26/2013 1920   CL 89* 12/26/2013 1920   CO2 22 12/26/2013 1920   GLUCOSE 224* 12/26/2013 1920   BUN 56* 12/26/2013 1920   CREATININE 5.96* 12/26/2013 1920   CALCIUM 8.3* 12/26/2013 1920   GFRNONAA 7* 12/26/2013 1920   GFRAA 8* 12/26/2013 1920   COAG No results found for this basename: INR, PROTIME     Disposition:  Discharge to :Home Discharge Instructions   Call MD for:  redness, tenderness, or signs of infection (pain, swelling, bleeding, redness, odor or green/yellow discharge around incision site)    Complete by:  As directed      Call MD for:  severe or increased pain, loss or decreased feeling  in affected limb(s)    Complete by:  As directed      Call MD for:  temperature >100.5    Complete by:  As directed      Increase activity slowly    Complete by:  As directed   Walk with assistance use walker or cane as needed     Resume previous diet    Complete by:  As directed             Medication List         acetaminophen 650 MG suppository  Commonly known as:  TYLENOL  Place 650 mg rectally as needed.  Alternate Site Lancing Device Misc  BS checks.     aspirin EC 81 MG tablet  Take 81 mg by mouth daily.     b complex vitamins tablet  Take 1 tablet by mouth daily.     calcium carbonate 200 MG capsule  Take 1,000 mg by mouth every 4 (four) hours.     DEXTROSE 50%/ELECTROLYTES IV  Inject into the vein.     glucagon 1 MG injection  Inject 1 mg into the vein once as needed (for emergency).     glucose blood test strip  Use as instructed     heparin 5000 UNIT/ML injection  Inject into the skin every 12 (twelve) hours.     hydrALAZINE 50 MG tablet  Commonly known as:  APRESOLINE  Take 1 tablet (50 mg total) by mouth 3 (three) times daily.     insulin aspart  100 UNIT/ML injection  Commonly known as:  novoLOG  Inject 0-15 Units into the skin 3 (three) times daily with meals.     insulin detemir 100 UNIT/ML injection  Commonly known as:  LEVEMIR  Inject 0.03 mLs (3 Units total) into the skin at bedtime.     iron sucrose in sodium chloride 0.9 % 100 mL  Inject into the vein. Dialysis     ISOSORBIDE MONONITRATE PO  Take 30 mg by mouth daily.     levothyroxine 25 MCG tablet  Commonly known as:  SYNTHROID, LEVOTHROID  Take 25 mcg by mouth daily before breakfast.     MINERAL OIL-HYDROPHIL PETROLAT EX  Apply topically 2 (two) times daily.     ondansetron 4 MG/5ML solution  Commonly known as:  ZOFRAN  Take by mouth every 8 (eight) hours as needed for nausea or vomiting.     PROMETHAZINE HCL IJ  Inject 12.5 mg as directed every 8 (eight) hours.     rOPINIRole 1 MG tablet  Commonly known as:  REQUIP  Take 1 mg by mouth at bedtime.     Syringe (Disposable) 1 ML Misc  1 mL by Does not apply route 4 (four) times daily - after meals and at bedtime.       Verbal and written Discharge instructions given to the patient. Wound care per Discharge AVS   Signed: Laurence Slate [facility] 12/27/2013, 12:38 PM

## 2013-12-27 NOTE — Progress Notes (Signed)
IV and tele d/c, family made aware of discharge Rickard Rhymes, RN

## 2013-12-27 NOTE — Discharge Summary (Signed)
[facility] Discharge Summary   Patient ID:  Brandi Arellano MRN: [id number] DOB/AGE: 1955-12-09 58 y.o.  Admit date: 12/26/2013 Discharge date: 12/27/2013 Date of Surgery: 12/26/2013 Surgeon: Surgeon(s): Serafina Quillin, MD  Admission Diagnosis: END STAGE RENAL  Discharge Diagnoses:  END STAGE RENAL  Secondary Diagnoses: Past Medical History  Diagnosis Date  . CAD (coronary artery disease)   . Hypertension   . Diabetes mellitus without complication     Type II  . CKD (chronic kidney disease) stage 4, GFR 15-29 ml/min   . Hyperlipidemia   . CHF (congestive heart failure)     Acute on chronic diastolic  . UTI (lower urinary tract infection)   . Thyroid disease     Abnormal Thyroid function Test  . Anemia   . DVT (deep venous thrombosis)   . Thrombocytopenia     Procedure(s): LOWER EXTREMITY ANGIOGRAM ABDOMINAL ANGIOGRAM UPPER EXTREMITY ANGIOGRAM ARCH AORTOGRAM VENOGRAM EXTREMITY BILATERAL  Discharged Condition: good  HPI: 58 yo with ESRD, on HD about 2 months ago. She is right handed. She had a catheter placed in [place]. She now has dialysis on Tuesday Thursday Saturday. While at [facility], she had a right axillary to brachial artery bypass graft with propatent external ring graft. She is found to have very small arteries. Diagnostic Studies:  She does not have visible also vein bilaterally or right basilic vein. There is a basilic vein on the left with diameter measurements from 0.3-0.4. She has triphasic waveforms within the left radial and ulnar artery at the wrist. With a positive Allen test. Waveforms are monophasic on the right.  Dr. Trula Slade performed: 1. bilateral upper extremity venogram  2. ultrasound-guided access, right femoral artery  3. aortic arch angiogram  4. third order catheterization  5. right upper extremity runoff  6. abdominal aortogram  7. bilateral lower extremity runoff     Hospital Course:  She was admitted from the  short stay holding area secondary to Hypertension and hyperglycemia.  Her BP was 194/102 and glucose was 538.    She was given Hydralazine and isosorbide with 15 units of novolog.   She report not taking any of her home medications and that she is out of her medications for blood pressure and diabetic control.  She was referred by Dr. Romie Levee for access and she states he was helping her get established with a primary care physician here in town since she is not from here.  I called him and he states if we could fill her medications that His office and the dialysis center social worker will aide her in finding a primary care physician. I wrote for her Insulin, insulin supplies and Hydralazine BP med's at discharge for 1 month. Dr. Trula Slade set up her f/u appointment.   Treatment Team:  Delfina Redwood, MD  Significant Diagnostic Studies: CBC Lab Results  Component Value Date   HGB 16.0* 12/26/2013   HCT 47.0* 12/26/2013    BMET    Component Value Date/Time   NA 130* 12/26/2013 1920   K 4.3 12/26/2013 1920   CL 89* 12/26/2013 1920   CO2 22 12/26/2013 1920   GLUCOSE 224* 12/26/2013 1920   BUN 56* 12/26/2013 1920   CREATININE 5.96* 12/26/2013 1920   CALCIUM 8.3* 12/26/2013 1920   GFRNONAA 7* 12/26/2013 1920   GFRAA 8* 12/26/2013 1920   COAG No results found for this basename: INR, PROTIME     Disposition:  Discharge to :Home Discharge Instructions   Call  MD for:  redness, tenderness, or signs of infection (pain, swelling, bleeding, redness, odor or green/yellow discharge around incision site)    Complete by:  As directed      Call MD for:  severe or increased pain, loss or decreased feeling  in affected limb(s)    Complete by:  As directed      Call MD for:  temperature >100.5    Complete by:  As directed      Increase activity slowly    Complete by:  As directed   Walk with assistance use walker or cane as needed     Resume previous diet    Complete by:  As directed              Medication List         acetaminophen 650 MG suppository  Commonly known as:  TYLENOL  Place 650 mg rectally as needed.     Alternate Site Lancing Device Misc  BS checks.     aspirin EC 81 MG tablet  Take 81 mg by mouth daily.     b complex vitamins tablet  Take 1 tablet by mouth daily.     calcium carbonate 200 MG capsule  Take 1,000 mg by mouth every 4 (four) hours.     DEXTROSE 50%/ELECTROLYTES IV  Inject into the vein.     glucagon 1 MG injection  Inject 1 mg into the vein once as needed (for emergency).     glucose blood test strip  Use as instructed     heparin 5000 UNIT/ML injection  Inject into the skin every 12 (twelve) hours.     hydrALAZINE 50 MG tablet  Commonly known as:  APRESOLINE  Take 1 tablet (50 mg total) by mouth 3 (three) times daily.     insulin aspart 100 UNIT/ML injection  Commonly known as:  novoLOG  Inject 0-15 Units into the skin 3 (three) times daily with meals.     insulin detemir 100 UNIT/ML injection  Commonly known as:  LEVEMIR  Inject 0.03 mLs (3 Units total) into the skin at bedtime.     iron sucrose in sodium chloride 0.9 % 100 mL  Inject into the vein. Dialysis     ISOSORBIDE MONONITRATE PO  Take 30 mg by mouth daily.     levothyroxine 25 MCG tablet  Commonly known as:  SYNTHROID, LEVOTHROID  Take 25 mcg by mouth daily before breakfast.     MINERAL OIL-HYDROPHIL PETROLAT EX  Apply topically 2 (two) times daily.     ondansetron 4 MG/5ML solution  Commonly known as:  ZOFRAN  Take by mouth every 8 (eight) hours as needed for nausea or vomiting.     PROMETHAZINE HCL IJ  Inject 12.5 mg as directed every 8 (eight) hours.     rOPINIRole 1 MG tablet  Commonly known as:  REQUIP  Take 1 mg by mouth at bedtime.     Syringe (Disposable) 1 ML Misc  1 mL by Does not apply route 4 (four) times daily - after meals and at bedtime.       Verbal and written Discharge instructions given to the patient. Wound care per Discharge  AVS   Signed: Laurence Slate [facility] 12/27/2013, 3:11 PM

## 2013-12-27 NOTE — Progress Notes (Signed)
Utilization Review Completed.Brandi Arellano T9/17/2015  

## 2013-12-27 NOTE — Progress Notes (Signed)
Discharge education, medication, prescriptions, and instructions given to pts care giver, her brother, he stated understanding and that he had no questions, pt dressed, pt leaving and going to dialysis, per Johnson PA pt is being set up with PCP Rickard Rhymes, RN

## 2014-01-23 ENCOUNTER — Encounter (HOSPITAL_BASED_OUTPATIENT_CLINIC_OR_DEPARTMENT_OTHER): Payer: Medicaid Other | Attending: General Surgery

## 2014-01-27 ENCOUNTER — Encounter (HOSPITAL_COMMUNITY): Payer: Self-pay | Admitting: Emergency Medicine

## 2014-01-27 ENCOUNTER — Inpatient Hospital Stay (HOSPITAL_COMMUNITY)
Admission: EM | Admit: 2014-01-27 | Discharge: 2014-02-14 | DRG: 239 | Disposition: A | Discharge status: Skilled Nursing Facility | Payer: Medicaid Other | Attending: Internal Medicine | Admitting: Internal Medicine

## 2014-01-27 ENCOUNTER — Emergency Department (HOSPITAL_COMMUNITY): Payer: Medicaid Other

## 2014-01-27 DIAGNOSIS — E1169 Type 2 diabetes mellitus with other specified complication: Secondary | ICD-10-CM | POA: Diagnosis present

## 2014-01-27 DIAGNOSIS — N2581 Secondary hyperparathyroidism of renal origin: Secondary | ICD-10-CM

## 2014-01-27 DIAGNOSIS — N186 End stage renal disease: Secondary | ICD-10-CM | POA: Diagnosis present

## 2014-01-27 DIAGNOSIS — L97514 Non-pressure chronic ulcer of other part of right foot with necrosis of bone: Secondary | ICD-10-CM | POA: Diagnosis present

## 2014-01-27 DIAGNOSIS — Z794 Long term (current) use of insulin: Secondary | ICD-10-CM

## 2014-01-27 DIAGNOSIS — K3184 Gastroparesis: Secondary | ICD-10-CM | POA: Diagnosis present

## 2014-01-27 DIAGNOSIS — I731 Thromboangiitis obliterans [Buerger's disease]: Secondary | ICD-10-CM | POA: Diagnosis present

## 2014-01-27 DIAGNOSIS — M659 Synovitis and tenosynovitis, unspecified: Secondary | ICD-10-CM | POA: Diagnosis present

## 2014-01-27 DIAGNOSIS — Z7982 Long term (current) use of aspirin: Secondary | ICD-10-CM

## 2014-01-27 DIAGNOSIS — E785 Hyperlipidemia, unspecified: Secondary | ICD-10-CM | POA: Diagnosis present

## 2014-01-27 DIAGNOSIS — Z8249 Family history of ischemic heart disease and other diseases of the circulatory system: Secondary | ICD-10-CM

## 2014-01-27 DIAGNOSIS — N189 Chronic kidney disease, unspecified: Secondary | ICD-10-CM

## 2014-01-27 DIAGNOSIS — Z87891 Personal history of nicotine dependence: Secondary | ICD-10-CM

## 2014-01-27 DIAGNOSIS — I5032 Chronic diastolic (congestive) heart failure: Secondary | ICD-10-CM | POA: Diagnosis present

## 2014-01-27 DIAGNOSIS — Z951 Presence of aortocoronary bypass graft: Secondary | ICD-10-CM

## 2014-01-27 DIAGNOSIS — L089 Local infection of the skin and subcutaneous tissue, unspecified: Secondary | ICD-10-CM | POA: Diagnosis present

## 2014-01-27 DIAGNOSIS — E1143 Type 2 diabetes mellitus with diabetic autonomic (poly)neuropathy: Secondary | ICD-10-CM | POA: Diagnosis present

## 2014-01-27 DIAGNOSIS — L03115 Cellulitis of right lower limb: Secondary | ICD-10-CM | POA: Diagnosis present

## 2014-01-27 DIAGNOSIS — D649 Anemia, unspecified: Secondary | ICD-10-CM

## 2014-01-27 DIAGNOSIS — D631 Anemia in chronic kidney disease: Secondary | ICD-10-CM | POA: Diagnosis present

## 2014-01-27 DIAGNOSIS — E871 Hypo-osmolality and hyponatremia: Secondary | ICD-10-CM | POA: Diagnosis present

## 2014-01-27 DIAGNOSIS — E11649 Type 2 diabetes mellitus with hypoglycemia without coma: Secondary | ICD-10-CM | POA: Diagnosis not present

## 2014-01-27 DIAGNOSIS — I120 Hypertensive chronic kidney disease with stage 5 chronic kidney disease or end stage renal disease: Secondary | ICD-10-CM | POA: Diagnosis present

## 2014-01-27 DIAGNOSIS — I10 Essential (primary) hypertension: Secondary | ICD-10-CM

## 2014-01-27 DIAGNOSIS — I2510 Atherosclerotic heart disease of native coronary artery without angina pectoris: Secondary | ICD-10-CM | POA: Diagnosis present

## 2014-01-27 DIAGNOSIS — E1122 Type 2 diabetes mellitus with diabetic chronic kidney disease: Secondary | ICD-10-CM | POA: Diagnosis present

## 2014-01-27 DIAGNOSIS — L97811 Non-pressure chronic ulcer of other part of right lower leg limited to breakdown of skin: Secondary | ICD-10-CM | POA: Diagnosis present

## 2014-01-27 DIAGNOSIS — M868X7 Other osteomyelitis, ankle and foot: Secondary | ICD-10-CM | POA: Diagnosis present

## 2014-01-27 DIAGNOSIS — I70261 Atherosclerosis of native arteries of extremities with gangrene, right leg: Secondary | ICD-10-CM | POA: Diagnosis present

## 2014-01-27 DIAGNOSIS — E1129 Type 2 diabetes mellitus with other diabetic kidney complication: Secondary | ICD-10-CM | POA: Diagnosis present

## 2014-01-27 DIAGNOSIS — D696 Thrombocytopenia, unspecified: Secondary | ICD-10-CM | POA: Diagnosis present

## 2014-01-27 DIAGNOSIS — I953 Hypotension of hemodialysis: Secondary | ICD-10-CM | POA: Diagnosis not present

## 2014-01-27 DIAGNOSIS — IMO0002 Reserved for concepts with insufficient information to code with codable children: Secondary | ICD-10-CM

## 2014-01-27 DIAGNOSIS — R110 Nausea: Secondary | ICD-10-CM

## 2014-01-27 DIAGNOSIS — K219 Gastro-esophageal reflux disease without esophagitis: Secondary | ICD-10-CM | POA: Diagnosis present

## 2014-01-27 DIAGNOSIS — Z89511 Acquired absence of right leg below knee: Secondary | ICD-10-CM

## 2014-01-27 DIAGNOSIS — E039 Hypothyroidism, unspecified: Secondary | ICD-10-CM

## 2014-01-27 DIAGNOSIS — E874 Mixed disorder of acid-base balance: Secondary | ICD-10-CM | POA: Diagnosis present

## 2014-01-27 DIAGNOSIS — Z992 Dependence on renal dialysis: Secondary | ICD-10-CM | POA: Diagnosis not present

## 2014-01-27 DIAGNOSIS — E031 Congenital hypothyroidism without goiter: Secondary | ICD-10-CM

## 2014-01-27 DIAGNOSIS — Z89611 Acquired absence of right leg above knee: Secondary | ICD-10-CM

## 2014-01-27 DIAGNOSIS — E11628 Type 2 diabetes mellitus with other skin complications: Secondary | ICD-10-CM

## 2014-01-27 DIAGNOSIS — S91331A Puncture wound without foreign body, right foot, initial encounter: Secondary | ICD-10-CM

## 2014-01-27 DIAGNOSIS — E1165 Type 2 diabetes mellitus with hyperglycemia: Secondary | ICD-10-CM | POA: Diagnosis present

## 2014-01-27 DIAGNOSIS — I132 Hypertensive heart and chronic kidney disease with heart failure and with stage 5 chronic kidney disease, or end stage renal disease: Secondary | ICD-10-CM

## 2014-01-27 DIAGNOSIS — R0989 Other specified symptoms and signs involving the circulatory and respiratory systems: Secondary | ICD-10-CM

## 2014-01-27 DIAGNOSIS — L97511 Non-pressure chronic ulcer of other part of right foot limited to breakdown of skin: Secondary | ICD-10-CM

## 2014-01-27 HISTORY — DX: Non-pressure chronic ulcer of other part of right foot with necrosis of bone: L97.514

## 2014-01-27 LAB — KETONES, QUALITATIVE: ACETONE BLD: NEGATIVE

## 2014-01-27 LAB — SALICYLATE LEVEL

## 2014-01-27 LAB — BASIC METABOLIC PANEL
ANION GAP: 22 — AB (ref 5–15)
ANION GAP: 20 — AB (ref 5–15)
BUN: 44 mg/dL — ABNORMAL HIGH (ref 6–23)
BUN: 47 mg/dL — AB (ref 6–23)
CHLORIDE: 83 meq/L — AB (ref 96–112)
CHLORIDE: 86 meq/L — AB (ref 96–112)
CO2: 21 meq/L (ref 19–32)
CO2: 23 mEq/L (ref 19–32)
Calcium: 8.8 mg/dL (ref 8.4–10.5)
Calcium: 8.1 mg/dL — ABNORMAL LOW (ref 8.4–10.5)
Creatinine, Ser: 4.88 mg/dL — ABNORMAL HIGH (ref 0.50–1.10)
Creatinine, Ser: 5.46 mg/dL — ABNORMAL HIGH (ref 0.50–1.10)
GFR calc Af Amer: 11 mL/min — ABNORMAL LOW (ref >90)
GFR calc non Af Amer: 9 mL/min — ABNORMAL LOW (ref >90)
GFR, EST AFRICAN AMERICAN: 9 mL/min — AB (ref >90)
GFR, EST NON AFRICAN AMERICAN: 8 mL/min — AB (ref >90)
GLUCOSE: 338 mg/dL — AB (ref 70–99)
Glucose, Bld: 306 mg/dL — ABNORMAL HIGH (ref 70–99)
POTASSIUM: 4.3 meq/L (ref 3.7–5.3)
POTASSIUM: 4.2 meq/L (ref 3.7–5.3)
SODIUM: 126 meq/L — AB (ref 137–147)
SODIUM: 129 meq/L — AB (ref 137–147)

## 2014-01-27 LAB — CBC
HCT: 31.9 % — ABNORMAL LOW (ref 36.0–46.0)
HEMOGLOBIN: 10.7 g/dL — AB (ref 12.0–15.0)
MCH: 30.7 pg (ref 26.0–34.0)
MCHC: 33.5 g/dL (ref 30.0–36.0)
MCV: 91.7 fL (ref 78.0–100.0)
Platelets: 139 10*3/uL — ABNORMAL LOW (ref 150–400)
RBC: 3.48 MIL/uL — ABNORMAL LOW (ref 3.87–5.11)
RDW: 13.3 % (ref 11.5–15.5)
WBC: 7.8 10*3/uL (ref 4.0–10.5)

## 2014-01-27 LAB — GLUCOSE, CAPILLARY: Glucose-Capillary: 233 mg/dL — ABNORMAL HIGH (ref 70–99)

## 2014-01-27 LAB — ACETAMINOPHEN LEVEL

## 2014-01-27 LAB — I-STAT CG4 LACTIC ACID, ED: LACTIC ACID, VENOUS: 1.38 mmol/L (ref 0.5–2.2)

## 2014-01-27 LAB — PHOSPHORUS: Phosphorus: 6.8 mg/dL — ABNORMAL HIGH (ref 2.3–4.6)

## 2014-01-27 LAB — PROTIME-INR
INR: 1.13 (ref 0.00–1.49)
PROTHROMBIN TIME: 14.6 s (ref 11.6–15.2)

## 2014-01-27 LAB — CBG MONITORING, ED: GLUCOSE-CAPILLARY: 277 mg/dL — AB (ref 70–99)

## 2014-01-27 LAB — POC OCCULT BLOOD, ED: Fecal Occult Bld: NEGATIVE

## 2014-01-27 MED ORDER — LEVOTHYROXINE SODIUM 25 MCG PO TABS
25.0000 ug | ORAL_TABLET | Freq: Every day | ORAL | Status: DC
  Administered 2014-01-28 – 2014-02-14 (×14): 25 ug via ORAL
  Filled 2014-01-27 (×23): qty 1

## 2014-01-27 MED ORDER — VANCOMYCIN HCL 10 G IV SOLR
1250.0000 mg | Freq: Once | INTRAVENOUS | Status: AC
  Administered 2014-01-27: 1250 mg via INTRAVENOUS
  Filled 2014-01-27: qty 1250

## 2014-01-27 MED ORDER — ONDANSETRON HCL 4 MG PO TABS: 4.0000 mg | ORAL_TABLET | Freq: Four times a day (QID) | ORAL | Status: DC | PRN

## 2014-01-27 MED ORDER — ONDANSETRON HCL 4 MG/2ML IJ SOLN
4.0000 mg | Freq: Four times a day (QID) | INTRAMUSCULAR | Status: DC | PRN
  Administered 2014-01-30 – 2014-02-03 (×8): 4 mg via INTRAVENOUS
  Filled 2014-01-27 (×7): qty 2

## 2014-01-27 MED ORDER — PIPERACILLIN-TAZOBACTAM IN DEX 2-0.25 GM/50ML IV SOLN
2.2500 g | Freq: Three times a day (TID) | INTRAVENOUS | Status: DC
  Administered 2014-01-27 – 2014-02-03 (×18): 2.25 g via INTRAVENOUS
  Filled 2014-01-27 (×24): qty 50

## 2014-01-27 MED ORDER — ACETAMINOPHEN 650 MG RE SUPP
650.0000 mg | Freq: Four times a day (QID) | RECTAL | Status: DC | PRN
Start: 2014-01-27 — End: 2014-01-27

## 2014-01-27 MED ORDER — HYDROMORPHONE HCL 1 MG/ML IJ SOLN: 0.5000 mg | Freq: Once | INTRAMUSCULAR | Status: DC

## 2014-01-27 MED ORDER — VANCOMYCIN HCL 500 MG IV SOLR
500.0000 mg | INTRAVENOUS | Status: DC
  Administered 2014-01-29 – 2014-02-02 (×3): 500 mg via INTRAVENOUS
  Filled 2014-01-27 (×6): qty 500

## 2014-01-27 MED ORDER — HEPARIN SODIUM (PORCINE) 5000 UNIT/ML IJ SOLN
5000.0000 [IU] | Freq: Three times a day (TID) | INTRAMUSCULAR | Status: AC
  Administered 2014-01-27: 5000 [IU] via SUBCUTANEOUS

## 2014-01-27 MED ORDER — SODIUM CHLORIDE 0.9 % IJ SOLN
3.0000 mL | Freq: Two times a day (BID) | INTRAMUSCULAR | Status: DC
  Administered 2014-01-27 – 2014-02-09 (×24): 3 mL via INTRAVENOUS

## 2014-01-27 MED ORDER — HYDROMORPHONE HCL 1 MG/ML IJ SOLN
0.5000 mg | INTRAMUSCULAR | Status: DC | PRN
  Administered 2014-01-27 – 2014-01-28 (×4): 0.5 mg via INTRAVENOUS
  Filled 2014-01-27 (×3): qty 1

## 2014-01-27 MED ORDER — INSULIN ASPART 100 UNIT/ML ~~LOC~~ SOLN
0.0000 [IU] | SUBCUTANEOUS | Status: DC
  Administered 2014-01-27: 3 [IU] via SUBCUTANEOUS
  Administered 2014-01-28: 1 [IU] via SUBCUTANEOUS
  Administered 2014-01-28: 5 [IU] via SUBCUTANEOUS
  Administered 2014-01-28: 3 [IU] via SUBCUTANEOUS
  Administered 2014-01-28: 2 [IU] via SUBCUTANEOUS

## 2014-01-27 MED ORDER — ACETAMINOPHEN 325 MG PO TABS: 650.0000 mg | ORAL_TABLET | Freq: Four times a day (QID) | ORAL | Status: DC | PRN

## 2014-01-27 MED ORDER — HYDROMORPHONE HCL 1 MG/ML IJ SOLN
1.0000 mg | Freq: Once | INTRAMUSCULAR | Status: AC
  Administered 2014-01-27: 1 mg via INTRAVENOUS
  Filled 2014-01-27: qty 1

## 2014-01-27 NOTE — Progress Notes (Addendum)
ANTIBIOTIC CONSULT NOTE - INITIAL  Pharmacy Consult for Vancomycin and Zosyn Indication: Cellulitis   Allergies  Allergen Reactions  . Atenolol   . Diphenhydramine Nausea And Vomiting    Does not stay in stomach  . Iodinated Diagnostic Agents   . Metoprolol   . Morphine And Related   . Thiopental   . Tramadol Hcl Nausea And Vomiting    Does not stay in stomach    Patient Measurements:    Vital Signs: Temp: 98 F (36.7 C) (10/18 1333) Temp Source: Oral (10/18 1333) BP: 143/74 mmHg (10/18 1333) Pulse Rate: 78 (10/18 1333) Intake/Output from previous day:   Intake/Output from this shift:    Labs:  Recent Labs  01/27/14 1427  WBC 7.8  HGB 10.7*  PLT 139*  CREATININE 4.88*   The CrCl is unknown because both a height and weight (above a minimum accepted value) are required for this calculation. No results found for this basename: VANCOTROUGH, VANCOPEAK, VANCORANDOM, GENTTROUGH, GENTPEAK, GENTRANDOM, TOBRATROUGH, TOBRAPEAK, TOBRARND, AMIKACINPEAK, AMIKACINTROU, AMIKACIN,  in the last 72 hours   Microbiology: No results found for this or any previous visit (from the past 720 hour(s)).  Medical History: Past Medical History  Diagnosis Date  . CAD (coronary artery disease)   . Hypertension   . Diabetes mellitus without complication     Type II  . CKD (chronic kidney disease) stage 4, GFR 15-29 ml/min   . Hyperlipidemia   . CHF (congestive heart failure)     Acute on chronic diastolic  . UTI (lower urinary tract infection)   . Thyroid disease     Abnormal Thyroid function Test  . Anemia   . DVT (deep venous thrombosis)   . Thrombocytopenia     Assessment: Brandi Arellano with hx DM and ESRD who presented to the [facility] on 10/18 with R-foot pain after stepping on a wood chip a couple of days ago. Pharmacy consulted to start Vancomycin for empiric cellulitis coverage. HD-T/Th/Sat  Goal of Therapy:  Vancomycin trough level 10-15 mcg/ml  Plan:  1. Start Vancomycin 1250  mg IV x 1 dose now 2. Vancomycin 500 mg post HD-T/Th/Sat 3. Will continue to follow HD schedule/duration, culture results, LOT, and antibiotic de-escalation plans   Alycia Rossetti, PharmD, BCPS Clinical Pharmacist Pager: [phone] 01/27/2014 4:22 PM    Addendum: Now adding zosyn. Will dose 2.25g IV q8 for HD.  Nena Jordan, PharmD, BCPS 01/27/2014, 6:32PM

## 2014-01-27 NOTE — ED Provider Notes (Signed)
CSN: [id number]     Arrival date & time 01/27/14  1314 History   First MD Initiated Contact with Patient 01/27/14 1516     Chief Complaint  Patient presents with  . 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 She states that a couple days ago she stepped on a piece of mulch, which punctured her shoe and her foot. She states that she did not notice until yesterday. She reports moderate to severe pain. It is worsened with movement and palpation. She has not tried taking anything to alleviate her symptoms. She denies fevers, chills, chest pain, shortness of breath, or abdominal pain. She is new to [place], and does not have a primary care doctor.  The history is provided by the patient. No language interpreter was used.    Past Medical History  Diagnosis Date  . CAD (coronary artery disease)   . Hypertension   . Diabetes mellitus without complication     Type II  . CKD (chronic kidney disease) stage 4, GFR 15-29 ml/min   . Hyperlipidemia   . CHF (congestive heart failure)     Acute on chronic diastolic  . UTI (lower urinary tract infection)   . Thyroid disease     Abnormal Thyroid function Test  . Anemia   . DVT (deep venous thrombosis)   . Thrombocytopenia    Past Surgical History  Procedure Laterality Date  . Coronary artery bypass graft    . Cholecystectomy    . Carotid endarterectomy      Bilateral  . Foot amputation Bilateral    Family History  Problem Relation Age of Onset  . Hypertension Father   . Heart disease Father     Coronary Artery Disease   History  Substance Use Topics  . Smoking status: Former Smoker -- 15 years    Types: Cigarettes  . Smokeless tobacco: Never Used  . Alcohol Use: No   OB  History   Grav Para Term Preterm Abortions TAB SAB Ect Mult Living                 Review of Systems  Constitutional: Negative for fever and chills.  Respiratory: Negative for shortness of breath.   Cardiovascular: Negative for chest pain.  Gastrointestinal: Negative for nausea, vomiting, diarrhea and constipation.  Genitourinary: Negative for dysuria.  Skin: Positive for wound.  All other systems reviewed and are negative.     Allergies  Atenolol; Diphenhydramine; Iodinated diagnostic agents; Metoprolol; Morphine and related; Thiopental; and Tramadol hcl  Home Medications   Prior to Admission medications   Medication Sig Start Date End Date Taking? Authorizing Provider  acetaminophen (TYLENOL) 650 MG suppository Place 650 mg rectally as needed.    Historical Provider, MD  aspirin EC 81 MG tablet Take 81 mg by mouth daily.    Historical Provider, MD  b complex vitamins tablet Take 1 tablet by mouth daily.    Historical Provider, MD  calcium carbonate 200 MG capsule Take 1,000 mg by mouth every 4 (four) hours.    Historical Provider, MD  Electrolyte-A in Dextrose (DEXTROSE 50%/ELECTROLYTES IV) Inject into the vein.    Historical Provider, MD  glucagon 1 MG injection Inject 1 mg into the vein once as needed (for emergency).     Historical Provider, MD  glucose blood test strip Use as instructed 12/27/13   Ulyses Amor, PA-C  heparin 5000 UNIT/ML injection Inject into the skin every 12 (twelve) hours.    Historical Provider, MD  hydrALAZINE (APRESOLINE) 50 MG tablet Take 1 tablet (50 mg total) by mouth 3 (three) times daily. 12/27/13   Ulyses Amor, PA-C  insulin aspart (NOVOLOG) 100 UNIT/ML injection Inject 0-15 Units into the skin 3 (three) times daily with meals. 12/27/13   Ulyses Amor, PA-C  insulin detemir (LEVEMIR) 100 UNIT/ML injection Inject 0.03 mLs (3 Units total) into the skin at bedtime. 12/27/13   Ulyses Amor, PA-C  iron sucrose in sodium chloride 0.9 % 100 mL  Inject into the vein. Dialysis    Historical Provider, MD  ISOSORBIDE MONONITRATE PO Take 30 mg by mouth daily.    Historical Provider, MD  Lancet Devices (ALTERNATE SITE LANCING DEVICE) MISC BS checks. 12/27/13   Ulyses Amor, PA-C  levothyroxine (SYNTHROID, LEVOTHROID) 25 MCG tablet Take 25 mcg by mouth daily before breakfast.    Historical Provider, MD  MINERAL OIL-HYDROPHIL PETROLAT EX Apply topically 2 (two) times daily.    Historical Provider, MD  ondansetron [facility]) 4 MG/5ML solution Take by mouth every 8 (eight) hours as needed for nausea or vomiting.    Historical Provider, MD  PROMETHAZINE HCL IJ Inject 12.5 mg as directed every 8 (eight) hours.    Historical Provider, MD  rOPINIRole (REQUIP) 1 MG tablet Take 1 mg by mouth at bedtime.    Historical Provider, MD  Syringe, Disposable, 1 ML MISC 1 mL by Does not apply route 4 (four) times daily - after meals and at bedtime. 12/27/13   Ulyses Amor, PA-C   BP 143/74  Pulse 78  Temp(Src) 98 F (36.7 C) (Oral)  Resp 18  SpO2 96% Physical Exam  Nursing note and vitals reviewed. Constitutional: She is oriented to person, place, and time. She appears well-developed and well-nourished.  HENT:  Head: Normocephalic and atraumatic.  Eyes: Conjunctivae and EOM are normal. Pupils are equal, round, and reactive to light.  Neck: Normal range of motion. Neck supple.  Cardiovascular: Normal rate and regular rhythm.  Exam reveals no gallop and no friction rub.   No murmur heard. Pulses obtained by doppler, not palpable  Pulmonary/Chest: Effort normal and breath sounds normal. No respiratory distress. She has no wheezes. She has no rales. She exhibits no tenderness.  Abdominal: Soft. Bowel sounds are normal. She exhibits no distension and no mass. There is no tenderness. There is no rebound and no guarding.  Musculoskeletal: Normal range of motion. She exhibits no edema and no tenderness.  Right foot remarkable for a great toe amputation, tender  to palpation over the lateral aspect  Neurological: She is alert and oriented to person, place, and time.  Skin: Skin is warm and dry.     Cellulitis of the right foot over the lateral aspect, there is darkening of the skin near the fifth MTP  Psychiatric: She has a normal mood and affect. Her behavior is normal. Judgment and thought content normal.    ED Course  Procedures (including critical care time) Results for orders placed during the hospital encounter of 01/27/14  CBC      Result Value Ref Range   WBC 7.8  4.0 - 10.5 K/uL   RBC 3.48 (*) 3.87 - 5.11 MIL/uL   Hemoglobin 10.7 (*) 12.0 - 15.0 g/dL   HCT 31.9 (*) 36.0 - 46.0 %  MCV 91.7  78.0 - 100.0 fL   MCH 30.7  26.0 - 34.0 pg   MCHC 33.5  30.0 - 36.0 g/dL   RDW 13.3  11.5 - 15.5 %   Platelets 139 (*) 150 - 400 K/uL  BASIC METABOLIC PANEL      Result Value Ref Range   Sodium 126 (*) 137 - 147 mEq/L   Potassium 4.3  3.7 - 5.3 mEq/L   Chloride 83 (*) 96 - 112 mEq/L   CO2 21  19 - 32 mEq/L   Glucose, Bld 338 (*) 70 - 99 mg/dL   BUN 44 (*) 6 - 23 mg/dL   Creatinine, Ser 4.88 (*) 0.50 - 1.10 mg/dL   Calcium 8.8  8.4 - 10.5 mg/dL   GFR calc non Af Amer 9 (*) >90 mL/min   GFR calc Af Amer 11 (*) >90 mL/min   Anion gap 22 (*) 5 - 15  PROTIME-INR      Result Value Ref Range   Prothrombin Time 14.6  11.6 - 15.2 seconds   INR 1.13  0.00 - 1.49  CBG MONITORING, ED      Result Value Ref Range   Glucose-Capillary 277 (*) 70 - 99 mg/dL  I-STAT CG4 LACTIC ACID, ED      Result Value Ref Range   Lactic Acid, Venous 1.38  0.5 - 2.2 mmol/L  POC OCCULT BLOOD, ED      Result Value Ref Range   Fecal Occult Bld NEGATIVE  NEGATIVE   Dg Foot Complete Right  01/27/2014   CLINICAL DATA:  58 year old female with previous amputation of the right great toe. Swelling redness an ulcer on the lateral side of the right foot.  EXAM: RIGHT FOOT COMPLETE - 3+ VIEW  COMPARISON:  None.  FINDINGS: No acute bony abnormality identified. Healed  postsurgical changes of prior right great toe and partial first ray amputation. 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  Subcutaneous gas and soft tissue swelling present overlying the fifth metatarsal. Small amount of gas overlying the fifth metatarsophalangeal joint.  Diffuse osteopenia.  Extensive calcifications of the posterior tibial artery, common plantar artery, and lateral plantar arch.  IMPRESSION: No acute bony abnormality.  Soft tissue swelling and subcutaneous gas adjacent to the fifth metatarsophalangeal joint and overlying the fifth metatarsal diaphysis, consistent with the given history of ulcer and inflammation/infection.  Atherosclerosis.  Surgical changes of prior left great toe amputation and partial first ray amputation, healed.  Signed,  Dulcy Fanny. Earleen Newport, DO  [facility]  [facility]   Electronically Signed   By: Corrie Mckusick D.O.   On: 01/27/2014 18:19     Imaging Review No results found.   EKG Interpretation None      MDM   Final diagnoses:  Foot infection    Diabetic patient with cellulitis to the right foot, possible deep infection, will obtain imaging and labs. Plan for admission to the hospital.  FOBT negative, patient has had significant decrease in HGB in the past month.    6:46 PM Patient discussed with Dr. Tamera Punt from orthopedics, who states that he or one of his colleagues will see the patient.  Most likely in the morning.  He states that because the piece of wood punctured the skin, he is not too concerned about the subcutaneous gas at this time.  The patient is afebrile, does not have a white count.  Will admit the patient to medicine.  Medications  vancomycin (VANCOCIN)  1,250 mg in sodium chloride 0.9 % 250 mL IVPB (1,250 mg Intravenous New Bag/Given 01/27/14 1805)  vancomycin (VANCOCIN) 500 mg in sodium chloride 0.9 % 100 mL IVPB (not administered)  piperacillin-tazobactam  (ZOSYN) IVPB 2.25 g (not administered)  HYDROmorphone (DILAUDID) injection 1 mg (1 mg Intravenous Given 01/27/14 1620)        Montine Circle, PA-C 01/27/14 1920

## 2014-01-27 NOTE — Progress Notes (Signed)
New Admission Note:   Arrival Method: per stretcher from ED with NT Shanon Brow and pt's brother Mental Orientation: alert, oriented X4, cooperative Telemetry: placed on telebox 23 after CCMD has been notified Assessment: Completed Skin: warm, dry, intact with swelling noted on the right lower extremity, with redness and swelling on the right foot, with gangrenous ulcer on the right foot, dry, kept open to air. IV: G22 on the right AC with transparent dressing, clean, dry and intact Pain: complains of pain 8 out 10 on the right foot, MD notified, awaiting for PRN pain med order. Tubes: none Safety Measures: Safety Fall Prevention Plan has been given, discussed and signed Admission: Completed 6 [place] Orientation: Patient has been orientated to the room, unit and staff.  Family: older brother at the bedside  Orders have been reviewed and implemented. Will continue to monitor the patient. Call light has been placed within reach and bed alarm has been activated.   Georgeanna Harrison BSN, RN [facility] [place]

## 2014-01-27 NOTE — H&P (Signed)
Date: 01/27/2014               Patient Name:  Brandi Arellano MRN: [id number]  DOB: 01-Jan-1956 Age / Sex: 58 y.o., female   PCP: No primary provider on file.         Medical Service: Internal Medicine Teaching Service         Attending Physician: Dr. Sid Falcon, MD    First Contact: Dr. Posey Pronto Pager: [phone]  Second Contact: Dr. Heber [place] Pager: [phone]       After Hours (After 5p/  First Contact Pager: [phone]  weekends / holidays): Second Contact Pager: [phone]   Chief Complaint: R foot wound   History of Present Illness: Brandi Arellano is a 58 year old woman with history of CAD, Diastolic CHF, HTN, DM2, ESRD on HD TThS, hypothyroidism, chronic anemia, thromocytopenia, DVT presenting with R foot wound. She stepped on wood chips on Monday. Since then she has had increased pain. She has tried soaking her foot in peroxide and applying neosporin with minimal improvement. She denies bleeding or drainage. Denies falling. She denies fevers, chills, chest pain, shortness of breath, cough, nausea, vomiting, abdominal pain, diarrhea, hematochezia, melena, dysuria, hematuria, rash, edema, arthralgias, paresthesias, LH, HA.  Meds: Current Facility-Administered Medications  Medication Dose Route Frequency Provider Last Rate Last Dose  . piperacillin-tazobactam (ZOSYN) IVPB 2.25 g  2.25 g Intravenous 3 times per day Deboraha Sprang, [facility]      . vancomycin (VANCOCIN) 1,250 mg in sodium chloride 0.9 % 250 mL IVPB  1,250 mg Intravenous Once Rolla Flatten, RPH 166.7 mL/hr at 01/27/14 1805 1,250 mg at 01/27/14 1805  . [START ON 01/29/2014] vancomycin (VANCOCIN) 500 mg in sodium chloride 0.9 % 100 mL IVPB  500 mg Intravenous Q T,Th,Sa-HD Rolla Flatten, [facility]       Current Outpatient Prescriptions  Medication Sig Dispense Refill  . aspirin EC 81 MG tablet Take 81 mg by mouth 2 (two) times daily.       . insulin aspart (NOVOLOG) 100 UNIT/ML injection Inject 0-15 Units into the skin 3  (three) times daily with meals.  10 mL  0  . insulin detemir (LEVEMIR) 100 UNIT/ML injection Inject 0.03 mLs (3 Units total) into the skin at bedtime.  10 mL  0  . levothyroxine (SYNTHROID, LEVOTHROID) 25 MCG tablet Take 25 mcg by mouth daily before breakfast.      . acetaminophen (TYLENOL) 650 MG suppository Place 650 mg rectally as needed.      Marland Kitchen b complex vitamins tablet Take 1 tablet by mouth daily.      . calcium carbonate 200 MG capsule Take 1,000 mg by mouth every 4 (four) hours.      . Electrolyte-A in Dextrose (DEXTROSE 50%/ELECTROLYTES IV) Inject into the vein.      Marland Kitchen glucagon 1 MG injection Inject 1 mg into the vein once as needed (for emergency).       Marland Kitchen glucose blood test strip Use as instructed  100 each  0  . heparin 5000 UNIT/ML injection Inject into the skin every 12 (twelve) hours.      . hydrALAZINE (APRESOLINE) 50 MG tablet Take 1 tablet (50 mg total) by mouth 3 (three) times daily.  90 tablet  0  . iron sucrose in sodium chloride 0.9 % 100 mL Inject into the vein. Dialysis      . ISOSORBIDE MONONITRATE PO Take 30 mg by mouth daily.      Brandi Arellano Devices (ALTERNATE  SITE LANCING DEVICE) MISC BS checks.  1 each  0  . MINERAL OIL-HYDROPHIL PETROLAT EX Apply topically 2 (two) times daily.      . ondansetron (ZOFRAN) 4 MG/5ML solution Take by mouth every 8 (eight) hours as needed for nausea or vomiting.      Marland Kitchen PROMETHAZINE HCL IJ Inject 12.5 mg as directed every 8 (eight) hours.      Marland Kitchen rOPINIRole (REQUIP) 1 MG tablet Take 1 mg by mouth at bedtime.      . Syringe, Disposable, 1 ML MISC 1 mL by Does not apply route 4 (four) times daily - after meals and at bedtime.  60 each  0    Allergies: Allergies as of 01/27/2014 - Review Complete 01/27/2014  Allergen Reaction Noted  . Atenolol  12/06/2013  . Diphenhydramine Nausea And Vomiting 12/06/2013  . Iodinated diagnostic agents  12/06/2013  . Metoprolol  12/06/2013  . Morphine and related  12/06/2013  . Thiopental  12/06/2013  .  Tramadol hcl Nausea And Vomiting 12/06/2013   Past Medical History  Diagnosis Date  . CAD (coronary artery disease)   . Hypertension   . Diabetes mellitus without complication     Type II  . CKD (chronic kidney disease) stage 4, GFR 15-29 ml/min   . Hyperlipidemia   . CHF (congestive heart failure)     Acute on chronic diastolic  . UTI (lower urinary tract infection)   . Thyroid disease     Abnormal Thyroid function Test  . Anemia   . DVT (deep venous thrombosis)   . Thrombocytopenia    Past Surgical History  Procedure Laterality Date  . Coronary artery bypass graft    . Cholecystectomy    . Carotid endarterectomy      Bilateral  . Foot amputation Bilateral    Family History  Problem Relation Age of Onset  . Hypertension Father   . Heart disease Father     Coronary Artery Disease   History   Social History  . Marital Status: Divorced    Spouse Name: N/A    Number of Children: N/A  . Years of Education: N/A   Occupational History  . Not on file.   Social History Main Topics  . Smoking status: Former Smoker -- 15 years    Types: Cigarettes  . Smokeless tobacco: Never Used  . Alcohol Use: No  . Drug Use: No  . Sexual Activity: Not on file   Other Topics Concern  . Not on file   Social History Narrative  . No narrative on file  On disability, lives with brother  Review of Systems: Constitutional: no fevers/chills Eyes: no vision changes Ears, nose, mouth, throat, and face: no cough Respiratory: no shortness of breath Cardiovascular: no chest pain Gastrointestinal: no nausea/vomiting, no abdominal pain, no constipation, no diarrhea Genitourinary: no dysuria, no hematuria Integument: no rash Hematologic/lymphatic: no bleeding/bruising, no edema Musculoskeletal: no arthralgias, no myalgias, +right foot pain Neurological: no paresthesias, no weakness  Physical Exam: Blood pressure 92/58, pulse 72, temperature 98 F (36.7 C), temperature source Oral,  resp. rate 14, SpO2 93.00%. General Apperance: NAD Head: Normocephalic, atraumatic Eyes: PERRL, EOMI, anicteric sclera Ears: Normal external ear canal Nose: Nares normal, septum midline, mucosa normal Throat: Lips, mucosa and tongue normal  Neck: Supple, trachea midline Back: No tenderness or bony abnormality  Lungs: Clear to auscultation bilaterally. No wheezes, rhonchi or rales. Breathing comfortably on RA Chest Wall: Nontender, well healed sternal scar, R sided HD catheter  in place Heart: Regular rate and rhythm, +systolic murmur, no rub/gallop Abdomen: Soft, nontender, nondistended, no rebound/guarding Extremities: Warm and well perfused, no edema, R great toe amputation, tender to palpation lateral aspect, 3cm wound with central dark area surrounded by erythema, no drainage, 2cm area of white tissue on lateral plantar surface, L foot with all toes amputated, 1cm area of white tissue on lateral plantar surface. Skin: No rashes Neurologic: Oriented x 3. Normal strength and sensation Psych: normal mood/affect  Lab results: Basic Metabolic Panel:  Recent Labs  01/27/14 1427  NA 126*  K 4.3  CL 83*  CO2 21  GLUCOSE 338*  BUN 44*  CREATININE 4.88*  CALCIUM 8.8   CBC:  Recent Labs  01/27/14 1427  WBC 7.8  HGB 10.7*  HCT 31.9*  MCV 91.7  PLT 139*   CBG:  Recent Labs  01/27/14 1631  GLUCAP 277*   Hemoglobin A1C: 12/26/2013 9.1   Thyroid Function Tests: TSH 12/26/2013 2.070  Coagulation:  Recent Labs  01/27/14 1723  LABPROT 14.6  INR 1.13   Misc. Labs: Lactic acid 01/27/2014 1.38 FOBT 01/27/2014 Negative  Imaging results:  Dg Foot Complete Right  01/27/2014   CLINICAL DATA:  58 year old female with previous amputation of the right great toe. Swelling redness an ulcer on the lateral side of the right foot.  EXAM: RIGHT FOOT COMPLETE - 3+ VIEW  COMPARISON:  None.  FINDINGS: No acute bony abnormality identified. Healed postsurgical changes of prior right  great toe and partial first ray amputation. 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  Subcutaneous gas and soft tissue swelling present overlying the fifth metatarsal. Small amount of gas overlying the fifth metatarsophalangeal joint.  Diffuse osteopenia.  Extensive calcifications of the posterior tibial artery, common plantar artery, and lateral plantar arch.  IMPRESSION: No acute bony abnormality.  Soft tissue swelling and subcutaneous gas adjacent to the fifth metatarsophalangeal joint and overlying the fifth metatarsal diaphysis, consistent with the given history of ulcer and inflammation/infection.  Atherosclerosis.  Surgical changes of prior left great toe amputation and partial first ray amputation, healed.  Signed,  Dulcy Fanny. Earleen Newport, DO  [facility]  [facility]   Electronically Signed   By: Corrie Mckusick D.O.   On: 01/27/2014 18:19    Other results: EKG: Normal sinus rhythm, T wave inversion in I, II, aVR, aVF, V4-6 seen previously, no significant changes otherwise compared to previous EKG  Assessment & Plan by Problem: Principal Problem:   Diabetic foot infection Active Problems:   End stage renal disease   Essential hypertension   DM type 2, uncontrolled, with renal complications   Hypothyroidism  Diabetic foot infection: WBC wnl, no tachycardia, no tachypnea, and afebrile - no SIRS. XR R foot with no acute bony abnormality, soft tissue swelling and subcutaneous gas adjacent to the fifth MTP joint. Ortho consulted by ED. Unlikely to have necrotizing infection - per ortho gas seen in XR likely from penetration of wood chip. Lactic acid wnl. In the ED she received 1250mg  vancomycin and 2.25g zosyn. -ortho consult pending -Wound care consult -R foot MRI -f/u blood cultures -tylenol 650mg  Q6hr prn mild pain, dilaudid 0.5mg  Q4hr prn severe pain -continue vancomycin 500mg  TThSa with HD -continue Zosyn 2.25g  Q8hr  Hyponatremia: Appears to be chronic - previously 130 12/26/2013. Likely 2/2 advanced renal failure.  -continue to monitor  Anion gap: Appears to be chronic - previously 19 on 12/26/2013. Bicarb wnl. Lactic acid wnl.  -Phosphorus  level -Acetaminophen level -Serum ketones  -Salicylate level  ESRD on HD TThS: appears euvolemic on exam -Next HD Tuesday  Chronic anemia: Admission 10.7. Normocytic. Baseline around 9-10. FOBT negative. Iron panel 10/18/2013 with 43 iron, 289 TIBC. Likely anemia of chronic disease -continue to monitor  Thromocytopenia: 139 on admission. Appears chronic - previously 106 11/08/2013. -continue to monitor  CAD, Diastolic CHF: chronic, stable -home ASA 81 daily held -home isosorbide mononitrate 30mg  daily held  HTN: normotensive -home hydralazine 50mg  TID held  DM2: last hgb A1c 12/26/2013 9.1. On Novolog sliding scale TID with meals and levemir 3u QHS -hold home insulin -SSI -Accuchecks Q4hr  Hypothyroidism: last TSH 12/26/2013 2.070 -continue home synthroid 16mcg daily  FEN: Heart healthy/carb modified, NPO after midnight -home B complex vitamins, calcium carbonate held  DVT ppx: subq heparin 5000u TID  Dispo: Disposition is deferred at this time, awaiting improvement of current medical problems. Anticipated discharge in approximately 2 day(s).   The patient does have a current PCP (No primary provider on file.) and does not need an [facility] hospital follow-up appointment after discharge.  The patient does not have transportation limitations that hinder transportation to clinic appointments.  Signed: Jacques Earthly, MD 01/27/2014, 7:12 PM

## 2014-01-27 NOTE — ED Notes (Signed)
She stepped on wood chips in yard 3 days ago. Now with area to R outer foot of necrotic and red skin. She c/o severe pain at site. She is a diabetic reports her blood sugars have been wnl at home

## 2014-01-28 ENCOUNTER — Inpatient Hospital Stay (HOSPITAL_COMMUNITY): Payer: Medicaid Other

## 2014-01-28 LAB — CBC
HCT: 29 % — ABNORMAL LOW (ref 36.0–46.0)
HEMOGLOBIN: 9.6 g/dL — AB (ref 12.0–15.0)
MCH: 30.4 pg (ref 26.0–34.0)
MCHC: 33.1 g/dL (ref 30.0–36.0)
MCV: 91.8 fL (ref 78.0–100.0)
Platelets: 126 10*3/uL — ABNORMAL LOW (ref 150–400)
RBC: 3.16 MIL/uL — AB (ref 3.87–5.11)
RDW: 13.3 % (ref 11.5–15.5)
WBC: 6 10*3/uL (ref 4.0–10.5)

## 2014-01-28 LAB — GLUCOSE, CAPILLARY
GLUCOSE-CAPILLARY: 205 mg/dL — AB (ref 70–99)
GLUCOSE-CAPILLARY: 257 mg/dL — AB (ref 70–99)
Glucose-Capillary: 118 mg/dL — ABNORMAL HIGH (ref 70–99)
Glucose-Capillary: 154 mg/dL — ABNORMAL HIGH (ref 70–99)
Glucose-Capillary: 136 mg/dL — ABNORMAL HIGH (ref 70–99)
Glucose-Capillary: 173 mg/dL — ABNORMAL HIGH (ref 70–99)

## 2014-01-28 LAB — MRSA PCR SCREENING: MRSA by PCR: NEGATIVE

## 2014-01-28 LAB — BASIC METABOLIC PANEL
Anion gap: 22 — ABNORMAL HIGH (ref 5–15)
BUN: 53 mg/dL — AB (ref 6–23)
CO2: 24 mEq/L (ref 19–32)
Calcium: 8.3 mg/dL — ABNORMAL LOW (ref 8.4–10.5)
Chloride: 89 mEq/L — ABNORMAL LOW (ref 96–112)
Creatinine, Ser: 6.23 mg/dL — ABNORMAL HIGH (ref 0.50–1.10)
GFR, EST AFRICAN AMERICAN: 8 mL/min — AB (ref >90)
GFR, EST NON AFRICAN AMERICAN: 7 mL/min — AB (ref >90)
Glucose, Bld: 133 mg/dL — ABNORMAL HIGH (ref 70–99)
POTASSIUM: 4.6 meq/L (ref 3.7–5.3)
Sodium: 135 mEq/L — ABNORMAL LOW (ref 137–147)

## 2014-01-28 MED ORDER — INSULIN ASPART 100 UNIT/ML ~~LOC~~ SOLN
0.0000 [IU] | Freq: Three times a day (TID) | SUBCUTANEOUS | Status: DC
  Administered 2014-01-30 – 2014-01-31 (×2): 3 [IU] via SUBCUTANEOUS
  Administered 2014-01-31: 7 [IU] via SUBCUTANEOUS
  Administered 2014-02-01: 3 [IU] via SUBCUTANEOUS
  Administered 2014-02-01: 5 [IU] via SUBCUTANEOUS
  Administered 2014-02-02 (×2): 2 [IU] via SUBCUTANEOUS
  Administered 2014-02-03: 1 [IU] via SUBCUTANEOUS
  Administered 2014-02-03 – 2014-02-04 (×2): 2 [IU] via SUBCUTANEOUS
  Administered 2014-02-05: 1 [IU] via SUBCUTANEOUS
  Administered 2014-02-05 – 2014-02-08 (×3): 2 [IU] via SUBCUTANEOUS
  Administered 2014-02-09 – 2014-02-10 (×2): 3 [IU] via SUBCUTANEOUS
  Administered 2014-02-10: 1 [IU] via SUBCUTANEOUS
  Administered 2014-02-11: 3 [IU] via SUBCUTANEOUS
  Administered 2014-02-12: 2 [IU] via SUBCUTANEOUS
  Administered 2014-02-14: 1 [IU] via SUBCUTANEOUS

## 2014-01-28 MED ORDER — HYDROMORPHONE HCL 1 MG/ML IJ SOLN
0.5000 mg | Freq: Once | INTRAMUSCULAR | Status: AC
  Administered 2014-01-28: 0.5 mg via INTRAVENOUS
  Filled 2014-01-28: qty 1

## 2014-01-28 MED ORDER — DOXERCALCIFEROL 4 MCG/2ML IV SOLN
1.0000 ug | INTRAVENOUS | Status: DC
  Administered 2014-01-29 – 2014-02-12 (×7): 1 ug via INTRAVENOUS
  Filled 2014-01-28 (×8): qty 2

## 2014-01-28 MED ORDER — SEVELAMER CARBONATE 800 MG PO TABS
1600.0000 mg | ORAL_TABLET | Freq: Three times a day (TID) | ORAL | Status: DC
  Administered 2014-01-30 – 2014-02-03 (×3): 1600 mg via ORAL
  Filled 2014-01-28 (×23): qty 2

## 2014-01-28 MED ORDER — HYDROMORPHONE HCL 1 MG/ML IJ SOLN
INTRAMUSCULAR | Status: AC
  Administered 2014-01-28: 0.5 mg via INTRAVENOUS
  Filled 2014-01-28: qty 1

## 2014-01-28 MED ORDER — DARBEPOETIN ALFA-POLYSORBATE 60 MCG/0.3ML IJ SOLN: 60.0000 ug | INTRAMUSCULAR | Status: DC

## 2014-01-28 MED ORDER — HYDROMORPHONE HCL 1 MG/ML IJ SOLN
0.5000 mg | INTRAMUSCULAR | Status: DC | PRN
  Administered 2014-01-28 – 2014-01-31 (×17): 1 mg via INTRAVENOUS
  Filled 2014-01-28 (×15): qty 1

## 2014-01-28 MED ORDER — HYDROMORPHONE HCL 1 MG/ML IJ SOLN
INTRAMUSCULAR | Status: AC
  Filled 2014-01-28: qty 1

## 2014-01-28 MED ORDER — RENA-VITE PO TABS
1.0000 | ORAL_TABLET | Freq: Every day | ORAL | Status: DC
  Administered 2014-01-28 – 2014-02-13 (×15): 1 via ORAL
  Filled 2014-01-28 (×19): qty 1

## 2014-01-28 MED ORDER — HYDROMORPHONE HCL 1 MG/ML IJ SOLN
1.0000 mg | Freq: Once | INTRAMUSCULAR | Status: AC
  Administered 2014-01-28: 1 mg via INTRAVENOUS

## 2014-01-28 NOTE — Consult Note (Signed)
Reason for Consult: Ulceration right foot fifth metatarsal head Referring Physician: Dr. [name] is an 59 y.o. female.  HPI: Patient is a 58 year old woman who reports a history of a mechanical foreign body penetrating the right foot. Patient believes this is the cause of her ulcer and infection. Patient is status post a left transmetatarsal amputation. She is cellulitis and pain in the right foot.  Past Medical History  Diagnosis Date  . CAD (coronary artery disease)   . Hypertension   . Diabetes mellitus without complication     Type II  . CKD (chronic kidney disease) stage 4, GFR 15-29 ml/min   . Hyperlipidemia   . CHF (congestive heart failure)     Acute on chronic diastolic  . UTI (lower urinary tract infection)   . Thyroid disease     Abnormal Thyroid function Test  . Anemia   . DVT (deep venous thrombosis)   . Thrombocytopenia     Past Surgical History  Procedure Laterality Date  . Coronary artery bypass graft    . Cholecystectomy    . Carotid endarterectomy      Bilateral  . Foot amputation Bilateral     Family History  Problem Relation Age of Onset  . Hypertension Father   . Heart disease Father     Coronary Artery Disease    Social History:  reports that she has quit smoking. Her smoking use included Cigarettes. She smoked 0.00 packs per day for 15 years. She has never used smokeless tobacco. She reports that she does not drink alcohol or use illicit drugs.  Allergies:  Allergies  Allergen Reactions  . Diphenhydramine Nausea And Vomiting  . Tramadol Hcl Nausea And Vomiting  . Atenolol Other (See Comments)    unknown  . Iodinated Diagnostic Agents Other (See Comments)    unknown  . Metoprolol Other (See Comments)    unknown  . Morphine And Related Other (See Comments)    unknown  . Thiopental Other (See Comments)    Other     Medications: I have reviewed the patient's current medications.  Results for orders placed during the  hospital encounter of 01/27/14 (from the past 48 hour(s))  CBC     Status: Abnormal   Collection Time    01/27/14  2:27 PM      Result Value Ref Range   WBC 7.8  4.0 - 10.5 K/uL   RBC 3.48 (*) 3.87 - 5.11 MIL/uL   Hemoglobin 10.7 (*) 12.0 - 15.0 g/dL   HCT 31.9 (*) 36.0 - 46.0 %   MCV 91.7  78.0 - 100.0 fL   MCH 30.7  26.0 - 34.0 pg   MCHC 33.5  30.0 - 36.0 g/dL   RDW 13.3  11.5 - 15.5 %   Platelets 139 (*) 150 - 400 K/uL  BASIC METABOLIC PANEL     Status: Abnormal   Collection Time    01/27/14  2:27 PM      Result Value Ref Range   Sodium 126 (*) 137 - 147 mEq/L   Potassium 4.3  3.7 - 5.3 mEq/L   Chloride 83 (*) 96 - 112 mEq/L   CO2 21  19 - 32 mEq/L   Glucose, Bld 338 (*) 70 - 99 mg/dL   BUN 44 (*) 6 - 23 mg/dL   Creatinine, Ser 4.88 (*) 0.50 - 1.10 mg/dL   Calcium 8.8  8.4 - 10.5 mg/dL   GFR calc non Af Amer 9 (*) >  90 mL/min   GFR calc Af Amer 11 (*) >90 mL/min   Comment: (NOTE)     The eGFR has been calculated using the CKD EPI equation.     This calculation has not been validated in all clinical situations.     eGFR's persistently <90 mL/min signify possible Chronic Kidney     Disease.   Anion gap 22 (*) 5 - 15  POC OCCULT BLOOD, ED     Status: None   Collection Time    01/27/14  3:58 PM      Result Value Ref Range   Fecal Occult Bld NEGATIVE  NEGATIVE  I-STAT CG4 LACTIC ACID, ED     Status: None   Collection Time    01/27/14  4:18 PM      Result Value Ref Range   Lactic Acid, Venous 1.38  0.5 - 2.2 mmol/L  CBG MONITORING, ED     Status: Abnormal   Collection Time    01/27/14  4:31 PM      Result Value Ref Range   Glucose-Capillary 277 (*) 70 - 99 mg/dL  PROTIME-INR     Status: None   Collection Time    01/27/14  5:23 PM      Result Value Ref Range   Prothrombin Time 14.6  11.6 - 15.2 seconds   INR 1.13  0.00 - 1.49  GLUCOSE, CAPILLARY     Status: Abnormal   Collection Time    01/27/14  9:31 PM      Result Value Ref Range   Glucose-Capillary 233 (*) 70  - 99 mg/dL  KETONES, QUALITATIVE     Status: None   Collection Time    01/27/14 10:53 PM      Result Value Ref Range   Acetone, Bld NEGATIVE  NEGATIVE  SALICYLATE LEVEL     Status: Abnormal   Collection Time    01/27/14 10:53 PM      Result Value Ref Range   Salicylate Lvl <8.6 (*) 2.8 - 20.0 mg/dL  ACETAMINOPHEN LEVEL     Status: None   Collection Time    01/27/14 10:53 PM      Result Value Ref Range   Acetaminophen (Tylenol), Serum <15.0  10 - 30 ug/mL   Comment:            THERAPEUTIC CONCENTRATIONS VARY     SIGNIFICANTLY. A RANGE OF 10-30     ug/mL MAY BE AN EFFECTIVE     CONCENTRATION FOR MANY PATIENTS.     HOWEVER, SOME ARE BEST TREATED     AT CONCENTRATIONS OUTSIDE THIS     RANGE.     ACETAMINOPHEN CONCENTRATIONS     >150 ug/mL AT 4 HOURS AFTER     INGESTION AND >50 ug/mL AT 12     HOURS AFTER INGESTION ARE     OFTEN ASSOCIATED WITH TOXIC     REACTIONS.  BASIC METABOLIC PANEL     Status: Abnormal   Collection Time    01/27/14 10:53 PM      Result Value Ref Range   Sodium 129 (*) 137 - 147 mEq/L   Potassium 4.2  3.7 - 5.3 mEq/L   Chloride 86 (*) 96 - 112 mEq/L   CO2 23  19 - 32 mEq/L   Glucose, Bld 306 (*) 70 - 99 mg/dL   BUN 47 (*) 6 - 23 mg/dL   Creatinine, Ser 5.46 (*) 0.50 - 1.10 mg/dL   Calcium 8.1 (*) 8.4 -  10.5 mg/dL   GFR calc non Af Amer 8 (*) >90 mL/min   GFR calc Af Amer 9 (*) >90 mL/min   Comment: (NOTE)     The eGFR has been calculated using the CKD EPI equation.     This calculation has not been validated in all clinical situations.     eGFR's persistently <90 mL/min signify possible Chronic Kidney     Disease.   Anion gap 20 (*) 5 - 15  PHOSPHORUS     Status: Abnormal   Collection Time    01/27/14 10:53 PM      Result Value Ref Range   Phosphorus 6.8 (*) 2.3 - 4.6 mg/dL  GLUCOSE, CAPILLARY     Status: Abnormal   Collection Time    01/27/14 11:59 PM      Result Value Ref Range   Glucose-Capillary 257 (*) 70 - 99 mg/dL  GLUCOSE, CAPILLARY      Status: Abnormal   Collection Time    01/28/14  4:05 AM      Result Value Ref Range   Glucose-Capillary 118 (*) 70 - 99 mg/dL  MRSA PCR SCREENING     Status: None   Collection Time    01/28/14  4:24 AM      Result Value Ref Range   MRSA by PCR NEGATIVE  NEGATIVE   Comment:            The GeneXpert MRSA Assay (FDA     approved for NASAL specimens     only), is one component of a     comprehensive MRSA colonization     surveillance program. It is not     intended to diagnose MRSA     infection nor to guide or     monitor treatment for     MRSA infections.  CBC     Status: Abnormal   Collection Time    01/28/14  5:33 AM      Result Value Ref Range   WBC 6.0  4.0 - 10.5 K/uL   RBC 3.16 (*) 3.87 - 5.11 MIL/uL   Hemoglobin 9.6 (*) 12.0 - 15.0 g/dL   HCT 29.0 (*) 36.0 - 46.0 %   MCV 91.8  78.0 - 100.0 fL   MCH 30.4  26.0 - 34.0 pg   MCHC 33.1  30.0 - 36.0 g/dL   RDW 13.3  11.5 - 15.5 %   Platelets 126 (*) 150 - 400 K/uL    Dg Foot Complete Right  01/27/2014   CLINICAL DATA:  58 year old female with previous amputation of the right great toe. Swelling redness an ulcer on the lateral side of the right foot.  EXAM: RIGHT FOOT COMPLETE - 3+ VIEW  COMPARISON:  None.  FINDINGS: No acute bony abnormality identified. Healed postsurgical changes of prior right great toe and partial first ray amputation. 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  Subcutaneous gas and soft tissue swelling present overlying the fifth metatarsal. Small amount of gas overlying the fifth metatarsophalangeal joint.  Diffuse osteopenia.  Extensive calcifications of the posterior tibial artery, common plantar artery, and lateral plantar arch.  IMPRESSION: No acute bony abnormality.  Soft tissue swelling and subcutaneous gas adjacent to the fifth metatarsophalangeal joint and overlying the fifth metatarsal diaphysis, consistent with the given history of ulcer and inflammation/infection.   Atherosclerosis.  Surgical changes of prior left great toe amputation and partial first ray amputation, healed.  Signed,  Dulcy Fanny. Earleen Newport, DO  [facility]  [facility]   Electronically Signed   By: Corrie Mckusick D.O.   On: 01/27/2014 18:19    Review of Systems  All other systems reviewed and are negative.  Blood pressure 97/49, pulse 65, temperature 98.2 F (36.8 C), temperature source Oral, resp. rate 18, weight 58.06 kg (128 lb), SpO2 97.00%. Physical Exam On examination patient's left transmetatarsal amputation is stable with no ulcerations. Examination of right foot she has pain to light touch consistent with ischemic changes. I cannot palpate a dorsalis pedis or posterior tibial pulse. She has a black gangrenous ulcer over the lateral border of the fifth metatarsal head which measures approximately 10 x 20 mm. Radiograph shows no destructive bony changes does show the previous partial first ray amputation with destructive changes of the second toe MTP joint. Assessment/Plan: Assessment: Ischemic gangrenous ulcer fifth metatarsal head right foot with a transmetatarsal amputation the left foot with diabetic insensate neuropathy and end-stage renal disease.  Plan: Will have ankle-brachial indices obtained of the right lower extremity. Anticipate patient will need a vascular consult for possible revascularization to the right lower extremity. No surgery planned until her vascular workup is complete.  Brandi Arellano V 01/28/2014, 6:55 AM

## 2014-01-28 NOTE — ED Provider Notes (Signed)
Medical screening examination/treatment/procedure(s) were conducted as a shared visit with non-physician practitioner(s) and myself.  I personally evaluated the patient during the encounter.  Diabetic foot ulcer after puncture wound.  Apparent necrotizing infection to R lateral foot with ulceration, drainage, and surrounding cellulitis.  Previous great toe amputation. Pulses with doppler. D/w ortho for debridement, needs admission for antibiotics.  Worsening anemia, hyponatremia, metabolic acidosis likely due to ESRD.   EKG Interpretation   Date/Time:  Sunday January 27 2014 20:30:32 EDT Ventricular Rate:  75 PR Interval:  188 QRS Duration: 102 QT Interval:  424 QTC Calculation: 474 R Axis:   32 Text Interpretation:  Sinus rhythm Left atrial enlargement LVH with  secondary repolarization abnormality No significant change was found  Confirmed by Wyvonnia Dusky  MD, Sapphira Harjo [phone]) on 01/27/2014 8:45:56 PM        Ezequiel Essex, MD 01/28/14 5916

## 2014-01-28 NOTE — Progress Notes (Signed)
Subjective: Her husband was present at bedside, and both were sleeping when we entered the room. She confirms that her R foot wound happened after stepping on a wood chip. She also noted how her L middle finger is locked in place and cannot move it all.  Objective: Vital signs in last 24 hours: Filed Vitals:   01/27/14 2128 01/28/14 0430 01/28/14 1016 01/28/14 1020  BP: 151/64 97/49 77/52  101/61  Pulse: 70 65 66   Temp: 97.8 F (36.6 C) 98.2 F (36.8 C) 98.4 F (36.9 C)   TempSrc:   Oral   Resp: 17 18 16    Weight: 128 lb (58.06 kg)     SpO2: 92% 97%     Weight change:   Intake/Output Summary (Last 24 hours) at 01/28/14 1113 Last data filed at 01/28/14 0623  Gross per 24 hour  Intake    400 ml  Output      0 ml  Net    400 ml   General: resting in bed, NAD HEENT: PERRL, EOMI, no scleral icterus Cardiac: RRR, systolic murmur best appreciation in mitral position Pulm: clear to auscultation bilaterally, no wheezes, rales, or rhonchi Abd: soft, nontender, nondistended, BS present Ext: L foot without toes covered in sock with several calluses present on lateral aspect, R foot with erythema surrounding wound of fifth metatarsal consistent with focal necrosis without purulent drainage and another area of granulation on lateral plantar surface; unable to palpate R PT/DP pulses Neuro:responds to questions appropriately; moving all extremities freely   Lab Results: Basic Metabolic Panel:  Recent Labs Lab 01/27/14 2253 01/28/14 0533  NA 129* 135*  K 4.2 4.6  CL 86* 89*  CO2 23 24  GLUCOSE 306* 133*  BUN 47* 53*  CREATININE 5.46* 6.23*  CALCIUM 8.1* 8.3*  PHOS 6.8*  --    CBC:  Recent Labs Lab 01/27/14 1427 01/28/14 0533  WBC 7.8 6.0  HGB 10.7* 9.6*  HCT 31.9* 29.0*  MCV 91.7 91.8  PLT 139* 126*   CBG:  Recent Labs Lab 01/27/14 1631 01/27/14 2131 01/27/14 2359 01/28/14 0405 01/28/14 0742  GLUCAP 277* 233* 257* 118* 154*    Micro Results: Recent  Results (from the past 240 hour(s))  CULTURE, BLOOD (ROUTINE X 2)     Status: None   Collection Time    01/27/14  5:05 PM      Result Value Ref Range Status   Specimen Description BLOOD LEFT FOREARM   Final   Special Requests BOTTLES DRAWN AEROBIC ONLY 4CC   Final   Culture  Setup Time     Final   Value: 01/27/2014 22:34     Performed at [facility]   Culture     Final   Value:        BLOOD CULTURE RECEIVED NO GROWTH TO DATE CULTURE WILL BE HELD FOR 5 DAYS BEFORE ISSUING A FINAL NEGATIVE REPORT     Performed at [facility]   Report Status PENDING   Incomplete  CULTURE, BLOOD (ROUTINE X 2)     Status: None   Collection Time    01/27/14  5:22 PM      Result Value Ref Range Status   Specimen Description BLOOD RIGHT HAND   Final   Special Requests BOTTLES DRAWN AEROBIC AND ANAEROBIC 5CC EA   Final   Culture  Setup Time     Final   Value: 01/27/2014 22:33     Performed at [facility]  Culture     Final   Value:        BLOOD CULTURE RECEIVED NO GROWTH TO DATE CULTURE WILL BE HELD FOR 5 DAYS BEFORE ISSUING A FINAL NEGATIVE REPORT     Performed at [facility]   Report Status PENDING   Incomplete  MRSA PCR SCREENING     Status: None   Collection Time    01/28/14  4:24 AM      Result Value Ref Range Status   MRSA by PCR NEGATIVE  NEGATIVE Final   Comment:            The GeneXpert MRSA Assay (FDA     approved for NASAL specimens     only), is one component of a     comprehensive MRSA colonization     surveillance program. It is not     intended to diagnose MRSA     infection nor to guide or     monitor treatment for     MRSA infections.   Studies/Results: Dg Foot Complete Right  01/27/2014   CLINICAL DATA:  58 year old female with previous amputation of the right great toe. Swelling redness an ulcer on the lateral side of the right foot.  EXAM: RIGHT FOOT COMPLETE - 3+ VIEW  COMPARISON:  None.  FINDINGS: No acute bony abnormality identified.  Healed postsurgical changes of prior right great toe and partial first ray amputation. 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  Subcutaneous gas and soft tissue swelling present overlying the fifth metatarsal. Small amount of gas overlying the fifth metatarsophalangeal joint.  Diffuse osteopenia.  Extensive calcifications of the posterior tibial artery, common plantar artery, and lateral plantar arch.  IMPRESSION: No acute bony abnormality.  Soft tissue swelling and subcutaneous gas adjacent to the fifth metatarsophalangeal joint and overlying the fifth metatarsal diaphysis, consistent with the given history of ulcer and inflammation/infection.  Atherosclerosis.  Surgical changes of prior left great toe amputation and partial first ray amputation, healed.  Signed,  Dulcy Fanny. Earleen Newport, DO  [facility]  [facility]   Electronically Signed   By: Corrie Mckusick D.O.   On: 01/27/2014 18:19   Medications: I have reviewed the patient's current medications. Scheduled Meds: .  HYDROmorphone (DILAUDID) injection  0.5 mg Intravenous Once  . insulin aspart  0-9 Units Subcutaneous 6 times per day  . levothyroxine  25 mcg Oral QAC breakfast  . piperacillin-tazobactam (ZOSYN)  IV  2.25 g Intravenous 3 times per day  . sodium chloride  3 mL Intravenous Q12H  . [START ON 01/29/2014] vancomycin  500 mg Intravenous Q T,Th,Sa-HD   Continuous Infusions:  PRN Meds:.HYDROmorphone (DILAUDID) injection, ondansetron (ZOFRAN) IV, ondansetron Assessment/Plan: Principal Problem:   Ischemic ulcer of right foot, limited to breakdown of skin Active Problems:   End stage renal disease   Essential hypertension   DM type 2, uncontrolled, with renal complications   Hypothyroidism  Ms. Winslett is a 58 year old female with DM2, ESRD, HTN, hypothyroidism who is hospitalized for ischemic ulcer of R foot with cellulitis found to have anion gap metabolic acidosis and  hyponatremia.   Ischemic ulcer of R foot with cellulitis: Likely given physical exam findings. WOCN & Ortho recommend vascular workup. -Follow-up blood cultures -Follow-up ABI -Follow-up MR R foot -Continue Dilaudid 0.5mg  q4hr prn for severe pain -Continue Tylenol 650mg  q6hr prn for mild pain -Continue vancomycin & Zosyn for polymicrobial culture  Hyponatremia: Na 135, up from 129 on admission. Likely  2/2 renal disease. Continue following  Anion gap metabolic acidosis: AG 22, up from 20 on admission. Likely 2/2 renal disease. Continue following.  ESRD on HD: She get HD T/H/Sa and will likely be here for the next day. -Consult nephrology  Anemia of chronic disease: Hb 9, down from 10 on admission.  -Continue following  Thrombocytopenia: Platelets 126, down from 139 on admission. -Continue following  CAD: Holding home meds.  Diastolic CHF: Holding home meds.  HTN: BP trending <100/40-50. Holding home meds.   DM2: CBGs 100s. Continue SSI-S.  Hypothyroidism: Continue home meds.  FEN:  -Diet: Renal/Carb Modified  DVT prophylaxis: heparin 5000 units subcutaneous  CODE STATUS: FULL CODE  Dispo: Disposition is deferred at this time, awaiting improvement of current medical problems.  Anticipated discharge in approximately 1-2 day(s).   The patient does not have a current PCP (No primary provider on file.) and does not need an [facility] hospital follow-up appointment after discharge.  The patient does have transportation limitations that hinder transportation to clinic appointments.  .Services Needed at time of discharge: Y = Yes, Blank = No PT:   OT:   RN:   Equipment:   Other:     LOS: 1 day   Charlott Rakes, MD 01/28/2014, 11:13 AM

## 2014-01-28 NOTE — Consult Note (Signed)
Review of record noted that orthopedics has completed an evaluation of the right foot wound.  No palpable pulses per ortho and the need for vascular consult for this reason is pending.  She is also pending ABI's.  Review of record indicates current wound care would be futile until patient is either revascularized.  Will not consult for this reason.  Discussed POC with patient and bedside nurse.  Re consult if needed, will not follow at this time. Thanks  Jaser Fullen [place], [place] [phone])

## 2014-01-28 NOTE — Progress Notes (Signed)
Attempted ankle brachial index, patient stated that she didn't want the test done because her foot hurts. Please contact us if the patient changes her mind.  01/28/2014  Maudry Mayhew, [place], [place], [place]

## 2014-01-28 NOTE — Consult Note (Signed)
[name] Renal Consultation Note    Indication for Consultation:  Management of ESRD/hemodialysis; anemia, hypertension/volume and secondary hyperparathyroidism PCP:  HPI: Brandi Arellano is a 58 y.o. female with ESRD secondary to DM and HTN on HD since July 2015 with chronic diastolic CHF, hypothyroidism, hx of left axillary and brachial bypass with left subclavian artery stent 11/07/13 at [facility] without permanent dialysis access yet, ASCVD with dx of bilateral CEAs and CABG, left transmet  with right foot ulcer of at least one month duration (noted 9/22 in [facility] notes and again 10/13). Pt. Had upper and LE venograms done in September with findings as per procedure note. 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 The common femoral arteryis patent with diffuse collateralization.  She had been referred for wound care from dialysis but had received an appointment for 02/15/14.. She presented to the ED 10/18 with complaint of right foot pain stating she stepped on a piece of mulch which punctured her shoe (per ED note).  Per her discussion with me she said she had the wound when Dr. Marval Regal (9/22) saw her and it got worse because he pulled off her dressing.  She has no N,V, D, fever or chills, No SOB or CP.  Denies problems with BP drops during HD but says " the machine beeps a lot".  She is in significant pain and tearful asking for more pain meds.  She is trying to eat. She states just got her "card" which is why she hasn't gotten a PCP yet.  Past Medical History  Diagnosis Date  . CAD (coronary artery disease)   . Hypertension   . Diabetes mellitus without complication     Type II  . CKD (chronic kidney disease) stage 4, GFR 15-29 ml/min   . Hyperlipidemia   . CHF (congestive heart failure)     Acute on chronic diastolic  . UTI (lower urinary  tract infection)   . Thyroid disease     Abnormal Thyroid function Test  . Anemia   . DVT (deep venous thrombosis)   . Thrombocytopenia    Past Surgical History  Procedure Laterality Date  . Coronary artery bypass graft    . Cholecystectomy    . Carotid endarterectomy      Bilateral  . Foot amputation Bilateral    Family History  Problem Relation Age of Onset  . Hypertension Father   . Heart disease Father     Coronary Artery Disease   Social History:  reports that she has quit smoking. Her smoking use included Cigarettes. She smoked 0.00 packs per day for 15 years. She has never used smokeless tobacco. She reports that she does not drink alcohol or use illicit drugs. Allergies  Allergen Reactions  . Diphenhydramine Nausea And Vomiting  . Tramadol Hcl Nausea And Vomiting  . Atenolol Other (See Comments)    unknown  . Iodinated Diagnostic Agents Other (See Comments)    unknown  . Metoprolol Other (See Comments)    unknown  . Morphine And Related Other (See Comments)    unknown  . Thiopental Other (See Comments)    Other    Prior to Admission medications   Medication Sig Start Date End Date Taking? Authorizing Provider  aspirin EC 81 MG tablet Take 81 mg by mouth 2 (two) times daily.    Yes Historical Provider, MD  insulin aspart (NOVOLOG) 100 UNIT/ML injection Inject 0-15 Units into the skin 3 (three) times daily with meals. 12/27/13  Yes Ulyses Amor, PA-C  insulin detemir (LEVEMIR) 100 UNIT/ML injection Inject 0.03 mLs (3 Units total) into the skin at bedtime. 12/27/13  Yes Ulyses Amor, PA-C  levothyroxine (SYNTHROID, LEVOTHROID) 25 MCG tablet Take 25 mcg by mouth daily before breakfast.   Yes Historical Provider, MD   Current Facility-Administered Medications  Medication Dose Route Frequency Provider Last Rate Last Dose  . HYDROmorphone (DILAUDID) injection 0.5 mg  0.5 mg Intravenous Q4H PRN Jacques Earthly, MD   0.5 mg at 01/28/14 0840  . insulin aspart (novoLOG)  injection 0-9 Units  0-9 Units Subcutaneous 6 times per day Otho Bellows, MD   2 Units at 01/28/14 0840  . levothyroxine (SYNTHROID, LEVOTHROID) tablet 25 mcg  25 mcg Oral QAC breakfast Otho Bellows, MD   25 mcg at 01/28/14 0631  . ondansetron (ZOFRAN) tablet 4 mg  4 mg Oral Q6H PRN Otho Bellows, MD       Or  . ondansetron [facility]) injection 4 mg  4 mg Intravenous Q6H PRN Otho Bellows, MD      . piperacillin-tazobactam (ZOSYN) IVPB 2.25 g  2.25 g Intravenous 3 times per day Deboraha Sprang, RPH 100 mL/hr at 01/28/14 1122 2.25 g at 01/28/14 1122  . sodium chloride 0.9 % injection 3 mL  3 mL Intravenous Q12H Otho Bellows, MD   3 mL at 01/28/14 1122  . [START ON 01/29/2014] vancomycin (VANCOCIN) 500 mg in sodium chloride 0.9 % 100 mL IVPB  500 mg Intravenous Q T,Th,Sa-HD Rolla Flatten, [facility]       Labs: Basic Metabolic Panel:  Recent Labs Lab 01/27/14 1427 01/27/14 2253 01/28/14 0533  NA 126* 129* 135*  K 4.3 4.2 4.6  CL 83* 86* 89*  CO2 21 23 24   GLUCOSE 338* 306* 133*  BUN 44* 47* 53*  CREATININE 4.88* 5.46* 6.23*  CALCIUM 8.8 8.1* 8.3*  PHOS  --  6.8*  --   CBC:  Recent Labs Lab 01/27/14 1427 01/28/14 0533  WBC 7.8 6.0  HGB 10.7* 9.6*  HCT 31.9* 29.0*  MCV 91.7 91.8  PLT 139* 126*   CBG:  Recent Labs Lab 01/27/14 2131 01/27/14 2359 01/28/14 0405 01/28/14 0742 01/28/14 1127  GLUCAP 233* 257* 118* 154* 136*   Studies/Results: Dg Foot Complete Right  01/27/2014   CLINICAL DATA:  58 year old female with previous amputation of the right great toe. Swelling redness an ulcer on the lateral side of the right foot.  EXAM: RIGHT FOOT COMPLETE - 3+ VIEW  COMPARISON:  None.  FINDINGS: No acute bony abnormality identified. Healed postsurgical changes of prior right great toe and partial first ray amputation. 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  Subcutaneous gas and soft tissue swelling present overlying the fifth metatarsal.  Small amount of gas overlying the fifth metatarsophalangeal joint.  Diffuse osteopenia.  Extensive calcifications of the posterior tibial artery, common plantar artery, and lateral plantar arch.  IMPRESSION: No acute bony abnormality.  Soft tissue swelling and subcutaneous gas adjacent to the fifth metatarsophalangeal joint and overlying the fifth metatarsal diaphysis, consistent with the given history of ulcer and inflammation/infection.  Atherosclerosis.  Surgical changes of prior left great toe amputation and partial first ray amputation, healed.  Signed,  Dulcy Fanny. Earleen Newport, DO  [facility]  [facility]   Electronically Signed   By:  Corrie Mckusick D.O.   On: 01/27/2014 18:19    ROS: As per HPI otherwise negative- she is a poor historian regarding the time frame and details of the evoluation of her right lateral foot ulcer.  Physical Exam: Filed Vitals:   01/27/14 2128 01/28/14 0430 01/28/14 1016 01/28/14 1020  BP: 151/64 97/49 77/52  101/61  Pulse: 70 65 66   Temp: 97.8 F (36.6 C) 98.2 F (36.8 C) 98.4 F (36.9 C)   TempSrc:   Oral   Resp: 17 18 16    Weight: 58.06 kg (128 lb)     SpO2: 92% 97%       General: Chronically ill appearing older than chronological age tearful d/t pain Head: Normocephalic, atraumatic, sclera non-icteric, mucus membranes are moist, dentition poor Neck: Supple. JVD not elevated. Lungs: Clear bilaterally to auscultation without wheezes, rales, or rhonchi, breathing easily Heart: RRR 2/6 murmur Abdomen: Soft, non-tender, non-distended with normoactive bowel sounds. Lower extremities: left transmet well healed; s/p right great toe amputation withy right lateral food ulcer- dark 3x 4 cm blood blister with surrounding erythema and edema, painful to light touch  Neuro: Alert and oriented X 3. Moves all extremities spontaneously. Psych:  Responds to questions appropriately, tearful due to pain Dialysis Access: right I-J  clean exit site  Dialysis Orders: [facility] T[facility] 4 hr 160 Optiflux 400/600 EDW 55 2K 2.25 Ca right I-J profile 4 Aranesp 40 last given 10/15 - first dose after being on hold x 1 month - Hgb 9.2 10/15 ferritin 158 sat 23% 9/26 - not treated with IV Fe because at that time Hgb was 12; iPTH 268 8/8 on Hectorol 1 - last P 5.5  Assessment/Plan: 1. Right lateral food ulcer with cellulitis -  - unable to tolerate dopplers - hx severe PVD/DM- on Vanc and Zosyn/ extremely painful - IV dilaudid 2. ESRD -  TTS - not able to get to edw - post HD  BP drop - based on post HD wts - evaluate EDW while here; has seen [facility] in the past for permanent vascular access, but no plan yet - poor options 3. Hypertension/volume  - Able to UF 3-4 kg max last post weight 57.4 with EDW 55 - no overt edema 4. Anemia  - Hgb outpt trending down - Aranesp dosed 40 last week - had been on 60 when placed on hold - based on Sept labs, likely needs Fe - recheck with pre HD labs 5. Metabolic bone disease -  Continue Hectorol and renvela -^ dose to 2 ac- has been on 1 ac 6. Nutrition - renal/carb mod diet - add vitamins  7. Hypothyroidism - on synthroid - needs updated dialysis med list at d/c - 8. DM - on SSI at home - need to update outpt med list at d/c  Myriam Jacobson, PA-C [facility] [phone] 01/28/2014, 1:18 PM   Pt seen, examined, agree w assess/plan as above with additions as indicated. R foot severely painful infection w fluctuance and MRI showing fluid collection w bone involvement c/w osteo/abcess. Have increased pain meds d/t severe pain.  On empiric abx, will likely need surgical intervention. Plan HD most likely tomorrow.  [name] MD pager [phone]    cell [phone] 01/28/2014, 5:55 PM

## 2014-01-28 NOTE — H&P (Addendum)
  Date: 01/28/2014  Patient name: Brandi Arellano  Medical record number: [id number]  Date of birth: [birth year]   I have seen and evaluated Brandi Arellano and discussed their care with the Residency Team.  Briefly, Brandi Arellano is a 58yo woman with pMH of DM2, CAD, dCHF, HTN, ESRD, hypothyroidism who presented with a right sided foot wound after an injury of stepping on wood chips.  She has a history of multiple toe amputations due to diabetes.  However, this wound is very painful and on a non dependent part of her foot, making concern for vascular causes high.  She notes that her pain has been increasing.  She denies drainage or bleeding, fever, chills, n/v, paresthesia or numbness to her feet.  On exam, she has a nickel sized wound to the lateral side of her foot on the right with an 2X2 cm of necrotic tissue also surrounded by a large area of erythema.  She also has a developing ulcer on her great toe foot pad on the plantar surface of her left foot.  Her right foot is without toes s/p surgery.  Her last surgery on her feet was in 2009.    Assessment and Plan: I have seen and evaluated the patient as outlined above. I agree with the formulated Assessment and Plan as detailed in the residents' admission note, with the following changes:   1. Foot wound infection, diabetic vs. Vascular vs. Combined - XR of foot as noted in resident note - Orthopedics and wound care are following, requesting a vascular work up - Agree with right foot MRI for better characterization - Vanc/Zosyn for now given erythema, severe pain.   - Pain control with hydromorphone.   2. Hyponatremia, chronic - Monitor - ? Related to renal function  Agree with other work up noted, as detailed in Dr. Marjory Sneddon note.   Sid Falcon, MD 10/19/20152:13 PM

## 2014-01-29 DIAGNOSIS — E119 Type 2 diabetes mellitus without complications: Secondary | ICD-10-CM

## 2014-01-29 DIAGNOSIS — E871 Hypo-osmolality and hyponatremia: Secondary | ICD-10-CM

## 2014-01-29 DIAGNOSIS — M659 Synovitis and tenosynovitis, unspecified: Secondary | ICD-10-CM

## 2014-01-29 DIAGNOSIS — I5030 Unspecified diastolic (congestive) heart failure: Secondary | ICD-10-CM

## 2014-01-29 DIAGNOSIS — N179 Acute kidney failure, unspecified: Secondary | ICD-10-CM

## 2014-01-29 DIAGNOSIS — I10 Essential (primary) hypertension: Secondary | ICD-10-CM

## 2014-01-29 DIAGNOSIS — M869 Osteomyelitis, unspecified: Secondary | ICD-10-CM

## 2014-01-29 DIAGNOSIS — D696 Thrombocytopenia, unspecified: Secondary | ICD-10-CM

## 2014-01-29 DIAGNOSIS — E039 Hypothyroidism, unspecified: Secondary | ICD-10-CM

## 2014-01-29 DIAGNOSIS — L97519 Non-pressure chronic ulcer of other part of right foot with unspecified severity: Secondary | ICD-10-CM

## 2014-01-29 DIAGNOSIS — Z992 Dependence on renal dialysis: Secondary | ICD-10-CM

## 2014-01-29 DIAGNOSIS — I2510 Atherosclerotic heart disease of native coronary artery without angina pectoris: Secondary | ICD-10-CM

## 2014-01-29 DIAGNOSIS — L03115 Cellulitis of right lower limb: Secondary | ICD-10-CM

## 2014-01-29 DIAGNOSIS — D631 Anemia in chronic kidney disease: Secondary | ICD-10-CM

## 2014-01-29 DIAGNOSIS — E872 Acidosis: Secondary | ICD-10-CM

## 2014-01-29 LAB — CBC
HCT: 29.5 % — ABNORMAL LOW (ref 36.0–46.0)
Hemoglobin: 9.5 g/dL — ABNORMAL LOW (ref 12.0–15.0)
MCH: 30.1 pg (ref 26.0–34.0)
MCHC: 32.2 g/dL (ref 30.0–36.0)
MCV: 93.4 fL (ref 78.0–100.0)
Platelets: 150 10*3/uL (ref 150–400)
RBC: 3.16 MIL/uL — ABNORMAL LOW (ref 3.87–5.11)
RDW: 13.5 % (ref 11.5–15.5)
WBC: 8.9 10*3/uL (ref 4.0–10.5)

## 2014-01-29 LAB — RENAL FUNCTION PANEL
Albumin: 2.9 g/dL — ABNORMAL LOW (ref 3.5–5.2)
Anion gap: 27 — ABNORMAL HIGH (ref 5–15)
BUN: 66 mg/dL — ABNORMAL HIGH (ref 6–23)
CO2: 20 mEq/L (ref 19–32)
Calcium: 8 mg/dL — ABNORMAL LOW (ref 8.4–10.5)
Chloride: 87 mEq/L — ABNORMAL LOW (ref 96–112)
Creatinine, Ser: 8.07 mg/dL — ABNORMAL HIGH (ref 0.50–1.10)
GFR calc Af Amer: 6 mL/min — ABNORMAL LOW (ref >90)
GFR calc non Af Amer: 5 mL/min — ABNORMAL LOW (ref >90)
Glucose, Bld: 227 mg/dL — ABNORMAL HIGH (ref 70–99)
Phosphorus: 8.7 mg/dL — ABNORMAL HIGH (ref 2.3–4.6)
Potassium: 5 mEq/L (ref 3.7–5.3)
Sodium: 134 mEq/L — ABNORMAL LOW (ref 137–147)

## 2014-01-29 LAB — IRON AND TIBC
Iron: 54 ug/dL (ref 42–135)
Saturation Ratios: 26 % (ref 20–55)
TIBC: 210 ug/dL — ABNORMAL LOW (ref 250–470)
UIBC: 156 ug/dL (ref 125–400)

## 2014-01-29 LAB — GLUCOSE, CAPILLARY
Glucose-Capillary: 108 mg/dL — ABNORMAL HIGH (ref 70–99)
Glucose-Capillary: 192 mg/dL — ABNORMAL HIGH (ref 70–99)
Glucose-Capillary: 229 mg/dL — ABNORMAL HIGH (ref 70–99)

## 2014-01-29 LAB — FERRITIN: Ferritin: 429 ng/mL — ABNORMAL HIGH (ref 10–291)

## 2014-01-29 LAB — HEPATITIS B SURFACE ANTIGEN: Hepatitis B Surface Ag: NEGATIVE

## 2014-01-29 MED ORDER — ALTEPLASE 2 MG IJ SOLR
2.0000 mg | Freq: Once | INTRAMUSCULAR | Status: DC | PRN
  Filled 2014-01-29: qty 2

## 2014-01-29 MED ORDER — SODIUM CHLORIDE 0.9 % IV SOLN: 100.0000 mL | INTRAVENOUS | Status: DC | PRN

## 2014-01-29 MED ORDER — LIDOCAINE-PRILOCAINE 2.5-2.5 % EX CREA
1.0000 "application " | TOPICAL_CREAM | CUTANEOUS | Status: DC | PRN
  Filled 2014-01-29: qty 5

## 2014-01-29 MED ORDER — PENTAFLUOROPROP-TETRAFLUOROETH EX AERO: 1.0000 "application " | INHALATION_SPRAY | CUTANEOUS | Status: DC | PRN

## 2014-01-29 MED ORDER — NEPRO/CARBSTEADY PO LIQD: 237.0000 mL | ORAL | Status: DC | PRN

## 2014-01-29 MED ORDER — HEPARIN SODIUM (PORCINE) 1000 UNIT/ML DIALYSIS: 1000.0000 [IU] | INTRAMUSCULAR | Status: DC | PRN

## 2014-01-29 MED ORDER — HYDROMORPHONE HCL 1 MG/ML IJ SOLN
INTRAMUSCULAR | Status: AC
  Filled 2014-01-29: qty 1

## 2014-01-29 MED ORDER — HEPARIN SODIUM (PORCINE) 1000 UNIT/ML DIALYSIS: 20.0000 [IU]/kg | INTRAMUSCULAR | Status: DC | PRN

## 2014-01-29 MED ORDER — INSULIN DETEMIR 100 UNIT/ML ~~LOC~~ SOLN
3.0000 [IU] | Freq: Every day | SUBCUTANEOUS | Status: DC
  Administered 2014-01-29: 3 [IU] via SUBCUTANEOUS
  Filled 2014-01-29 (×2): qty 0.03

## 2014-01-29 MED ORDER — DOXERCALCIFEROL 4 MCG/2ML IV SOLN
INTRAVENOUS | Status: AC
  Filled 2014-01-29: qty 2

## 2014-01-29 MED ORDER — LIDOCAINE HCL (PF) 1 % IJ SOLN: 5.0000 mL | INTRAMUSCULAR | Status: DC | PRN

## 2014-01-29 NOTE — Procedures (Signed)
I was present at this dialysis session, have reviewed the session itself and made  appropriate changes  [name] MD (pgr) [phone]    ([phone] 01/29/2014, 10:16 AM

## 2014-01-29 NOTE — Progress Notes (Signed)
[facility] Progress Note  Assessment/Plan: 1. Right lateral foot ulcer with osteomyelitis/cellulitis - - unable to tolerate dopplers - hx severe PVD/DM- on Vanc and Zosyn/ extremely painful - IV dilaudid; given severe PVD, suspect she would do best with amputation 2. ESRD - TTS - not able to get to edw - post HD BP drop - based on post HD wts - evaluate EDW while here; has seen [facility] in the past for permanent vascular access, but no plan yet - poor options 3. Hypertension/volume -BP variable Able to UF 3-4 kg max last post weight 57.4 with EDW 55 - no overt edema 4. Anemia - Hgb outpt trending down now 9.5  - Aranesp dosed 40 last week - had been on 60 when placed on hold - based on Sept labs, likely needs Fe - recheck with pre HD labs; Aranesp 60 10/20 5. Metabolic bone disease - Continue Hectorol and renvela -^ dose to 2 ac yesterday- has been on 1 ac P ^ 8.7 6. Nutrition - renal/carb mod diet - add vitamins Alb 2.9 7. Hypothyroidism - on synthroid - needs updated dialysis med list at d/c - 8. DM - on SSI at home - need to update outpt med list at d/c  Myriam Jacobson, PA-C [facility] [phone] 01/29/2014,9:00 AM  LOS: 2 days   Pt seen, examined and agree w A/P as above.  [name] MD pager [phone]    cell [phone] 01/29/2014, 10:17 AM    Subjective:   Getting some pain control  Objective Filed Vitals:   01/29/14 0715 01/29/14 0747 01/29/14 0815 01/29/14 0830  BP: 148/81 85/54 96/49  118/57  Pulse: 84 75 81 79  Temp:      TempSrc:      Resp:   14 13  Height:      Weight:      SpO2:       Physical Exam on HD goal 3 L General: NAD face somewhat puffy Heart: RRR 2/6 murmur Lungs:  Grossly clear Abdomen: soft NT Extremities: no LE Dialysis Access: right I-J  Dialysis Orders: [place] TTS 4 hr 160 Optiflux 400/600 EDW 55 2K 2.25 Ca right I-J profile 4 Aranesp 40 last given 10/15 - first dose after being on hold x 1 month - Hgb 9.2  10/15 ferritin 158 sat 23% 9/26 - not treated with IV Fe because at that time Hgb was 12; iPTH 268 8/8 on Hectorol 1 - last P 5.5  Additional Objective Labs: Basic Metabolic Panel:  Recent Labs Lab 01/27/14 2253 01/28/14 0533 01/29/14 0720  NA 129* 135* 134*  K 4.2 4.6 5.0  CL 86* 89* 87*  CO2 23 24 20   GLUCOSE 306* 133* 227*  BUN 47* 53* 66*  CREATININE 5.46* 6.23* 8.07*  CALCIUM 8.1* 8.3* 8.0*  PHOS 6.8*  --  8.7*   Liver Function Tests:  Recent Labs Lab 01/29/14 0720  ALBUMIN 2.9*   CBC:  Recent Labs Lab 01/27/14 1427 01/28/14 0533 01/29/14 0720  WBC 7.8 6.0 8.9  HGB 10.7* 9.6* 9.5*  HCT 31.9* 29.0* 29.5*  MCV 91.7 91.8 93.4  PLT 139* 126* 150  CBG:  Recent Labs Lab 01/28/14 0405 01/28/14 0742 01/28/14 1127 01/28/14 1733 01/28/14 2038  GLUCAP 118* 154* 136* 205* 173*  Studies/Results: Mr Foot Right Wo Contrast  01/28/2014   CLINICAL DATA:  Open wound on the lateral aspect of the right foot since last Monday. Stepped on wood chips.  EXAM: MRI OF  THE RIGHT FOREFOOT WITHOUT CONTRAST  TECHNIQUE: Multiplanar, multisequence MR imaging was performed. No intravenous contrast was administered.  COMPARISON:  Radiographs 01/27/2014  FINDINGS: Examination is extremely limited due to patient motion. The patient would not or could not hold still for the exam.  There is a large fluid collection surrounding the fifth metatarsal worrisome for an abscess. There is also signal abnormality in the fifth metatarsal shaft suspicious for osteomyelitis. No obvious changes of septic arthritis.  IMPRESSION: Very limited examination but findings consistent with an abscess surrounding the fifth metatarsal and fifth metatarsal osteomyelitis.   Electronically Signed   By: Kalman Jewels M.D.   On: 01/28/2014 17:12   Dg Foot Complete Right  01/27/2014   CLINICAL DATA:  58 year old female with previous amputation of the right great toe. Swelling redness an ulcer on the lateral side of the  right foot.  EXAM: RIGHT FOOT COMPLETE - 3+ VIEW  COMPARISON:  None.  FINDINGS: No acute bony abnormality identified. Healed postsurgical changes of prior right great toe and partial first ray amputation. 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  Subcutaneous gas and soft tissue swelling present overlying the fifth metatarsal. Small amount of gas overlying the fifth metatarsophalangeal joint.  Diffuse osteopenia.  Extensive calcifications of the posterior tibial artery, common plantar artery, and lateral plantar arch.  IMPRESSION: No acute bony abnormality.  Soft tissue swelling and subcutaneous gas adjacent to the fifth metatarsophalangeal joint and overlying the fifth metatarsal diaphysis, consistent with the given history of ulcer and inflammation/infection.  Atherosclerosis.  Surgical changes of prior left great toe amputation and partial first ray amputation, healed.  Signed,  Dulcy Fanny. Earleen Newport, DO  [facility]  [facility]   Electronically Signed   By: Corrie Mckusick D.O.   On: 01/27/2014 18:19   Medications:   . [START ON 01/31/2014] darbepoetin (ARANESP) injection - DIALYSIS  60 mcg Intravenous Q Thu-HD  . doxercalciferol  1 mcg Intravenous Q T,Th,Sa-HD  . HYDROmorphone      . insulin aspart  0-9 Units Subcutaneous TID WC  . levothyroxine  25 mcg Oral QAC breakfast  . multivitamin  1 tablet Oral QHS  . piperacillin-tazobactam (ZOSYN)  IV  2.25 g Intravenous 3 times per day  . sevelamer carbonate  1,600 mg Oral TID WC  . sodium chloride  3 mL Intravenous Q12H  . vancomycin  500 mg Intravenous Q T,Th,Sa-HD

## 2014-01-29 NOTE — Progress Notes (Signed)
  Date: 01/29/2014  Patient name: Brandi Arellano  Medical record number: [id number]  Date of birth: 20-May-1955   This patient has been seen and the plan of care was discussed with the house staff. Please see their note for complete details. I concur with their findings with the following additions/corrections:  Foot ulcer does appear to be ischemic from clinical findings and exam.  Vascular Surgery following, Nephrology following for ESRD.  She is on broad range antibiotics and blood cultures are pending.  Unclear course going forward, Nephrology note from today suggested possible amputation given her poor vascular status and inability to have a site for AVG when HD was initiated.  Will follow up with consultants, hopefully will be able to get ABIs or an arteriogram.    Sid Falcon, MD 01/29/2014, 12:39 PM

## 2014-01-29 NOTE — Progress Notes (Signed)
UR completed 

## 2014-01-29 NOTE — Progress Notes (Signed)
Subjective: She was in dialysis this AM and was sleeping. Her pain is better controlled. She voices understanding that she might need to repeat the ABI today.   Objective: Vital signs in last 24 hours: Filed Vitals:   01/28/14 2031 01/28/14 2040 01/29/14 0441 01/29/14 0707  BP: 130/65  122/66 141/75  Pulse: 80  84 81  Temp: 98.3 F (36.8 C)  97.8 F (36.6 C) 98.3 F (36.8 C)  TempSrc: Oral  Oral Oral  Resp: 21  20 16   Height:  5\' 3"  (1.6 m)    Weight: 128 lb (58.06 kg)   130 lb 4.7 oz (59.1 kg)  SpO2: 93%  93% 87%   Weight change: 0 lb (0 kg)  Intake/Output Summary (Last 24 hours) at 01/29/14 0717 Last data filed at 01/29/14 0442  Gross per 24 hour  Intake      0 ml  Output      0 ml  Net      0 ml   General: resting in bed, NAD HEENT: PERRL, EOMI, no scleral icterus Cardiac: RRR, systolic murmur best appreciation in mitral position Pulm: clear to auscultation bilaterally, no wheezes, rales, or rhonchi Abd: soft, nontender, nondistended, BS present Ext: L foot without toes covered in sock with several calluses present on lateral aspect, R foot with erythema surrounding wound of fifth metatarsal consistent with focal necrosis without purulent drainage and another area of granulation on lateral plantar surface; erythema has not advanced past the line demarcated from yesterday; unable to palpate R PT/DP pulses; L middle finger flexed and resistant to extension Neuro:responds to questions appropriately; moving all extremities freely   Lab Results: Basic Metabolic Panel:  Recent Labs Lab 01/27/14 2253 01/28/14 0533 01/29/14 0720  NA 129* 135* 134*  K 4.2 4.6 5.0  CL 86* 89* 87*  CO2 23 24 20   GLUCOSE 306* 133* 227*  BUN 47* 53* 66*  CREATININE 5.46* 6.23* 8.07*  CALCIUM 8.1* 8.3* 8.0*  PHOS 6.8*  --  8.7*   CBC:  Recent Labs Lab 01/28/14 0533 01/29/14 0720  WBC 6.0 8.9  HGB 9.6* 9.5*  HCT 29.0* 29.5*  MCV 91.8 93.4  PLT 126* 150   CBG:  Recent  Labs Lab 01/27/14 2359 01/28/14 0405 01/28/14 0742 01/28/14 1127 01/28/14 1733 01/28/14 2038  GLUCAP 257* 118* 154* 136* 205* 173*    Micro Results: Recent Results (from the past 240 hour(s))  CULTURE, BLOOD (ROUTINE X 2)     Status: None   Collection Time    01/27/14  5:05 PM      Result Value Ref Range Status   Specimen Description BLOOD LEFT FOREARM   Final   Special Requests BOTTLES DRAWN AEROBIC ONLY 4CC   Final   Culture  Setup Time     Final   Value: 01/27/2014 22:34     Performed at [facility]   Culture     Final   Value:        BLOOD CULTURE RECEIVED NO GROWTH TO DATE CULTURE WILL BE HELD FOR 5 DAYS BEFORE ISSUING A FINAL NEGATIVE REPORT     Performed at [facility]   Report Status PENDING   Incomplete  CULTURE, BLOOD (ROUTINE X 2)     Status: None   Collection Time    01/27/14  5:22 PM      Result Value Ref Range Status   Specimen Description BLOOD RIGHT HAND   Final   Special Requests BOTTLES  DRAWN AEROBIC AND ANAEROBIC 5CC EA   Final   Culture  Setup Time     Final   Value: 01/27/2014 22:33     Performed at [facility]   Culture     Final   Value:        BLOOD CULTURE RECEIVED NO GROWTH TO DATE CULTURE WILL BE HELD FOR 5 DAYS BEFORE ISSUING A FINAL NEGATIVE REPORT     Performed at [facility]   Report Status PENDING   Incomplete  MRSA PCR SCREENING     Status: None   Collection Time    01/28/14  4:24 AM      Result Value Ref Range Status   MRSA by PCR NEGATIVE  NEGATIVE Final   Comment:            The GeneXpert MRSA Assay (FDA     approved for NASAL specimens     only), is one component of a     comprehensive MRSA colonization     surveillance program. It is not     intended to diagnose MRSA     infection nor to guide or     monitor treatment for     MRSA infections.   Studies/Results: Mr Foot Right Wo Contrast  01/28/2014   CLINICAL DATA:  Open wound on the lateral aspect of the right foot since last Monday.  Stepped on wood chips.  EXAM: MRI OF THE RIGHT FOREFOOT WITHOUT CONTRAST  TECHNIQUE: Multiplanar, multisequence MR imaging was performed. No intravenous contrast was administered.  COMPARISON:  Radiographs 01/27/2014  FINDINGS: Examination is extremely limited due to patient motion. The patient would not or could not hold still for the exam.  There is a large fluid collection surrounding the fifth metatarsal worrisome for an abscess. There is also signal abnormality in the fifth metatarsal shaft suspicious for osteomyelitis. No obvious changes of septic arthritis.  IMPRESSION: Very limited examination but findings consistent with an abscess surrounding the fifth metatarsal and fifth metatarsal osteomyelitis.   Electronically Signed   By: Kalman Jewels M.D.   On: 01/28/2014 17:12   Dg Foot Complete Right  01/27/2014   CLINICAL DATA:  58 year old female with previous amputation of the right great toe. Swelling redness an ulcer on the lateral side of the right foot.  EXAM: RIGHT FOOT COMPLETE - 3+ VIEW  COMPARISON:  None.  FINDINGS: No acute bony abnormality identified. Healed postsurgical changes of prior right great toe and partial first ray amputation. 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  Subcutaneous gas and soft tissue swelling present overlying the fifth metatarsal. Small amount of gas overlying the fifth metatarsophalangeal joint.  Diffuse osteopenia.  Extensive calcifications of the posterior tibial artery, common plantar artery, and lateral plantar arch.  IMPRESSION: No acute bony abnormality.  Soft tissue swelling and subcutaneous gas adjacent to the fifth metatarsophalangeal joint and overlying the fifth metatarsal diaphysis, consistent with the given history of ulcer and inflammation/infection.  Atherosclerosis.  Surgical changes of prior left great toe amputation and partial first ray amputation, healed.  Signed,  Dulcy Fanny. Earleen Newport, DO  [facility]  [facility]   Electronically Signed   By: Corrie Mckusick D.O.   On: 01/27/2014 18:19   Medications: I have reviewed the patient's current medications. Scheduled Meds: . [START ON 01/31/2014] darbepoetin (ARANESP) injection - DIALYSIS  60 mcg Intravenous Q Thu-HD  . doxercalciferol  1 mcg Intravenous Q T,Th,Sa-HD  . insulin aspart  0-9 Units Subcutaneous TID WC  . levothyroxine  25 mcg Oral QAC breakfast  . multivitamin  1 tablet Oral QHS  . piperacillin-tazobactam (ZOSYN)  IV  2.25 g Intravenous 3 times per day  . sevelamer carbonate  1,600 mg Oral TID WC  . sodium chloride  3 mL Intravenous Q12H  . vancomycin  500 mg Intravenous Q T,Th,Sa-HD   Continuous Infusions:  PRN Meds:.HYDROmorphone (DILAUDID) injection, ondansetron (ZOFRAN) IV, ondansetron Assessment/Plan: Principal Problem:   Ischemic ulcer of right foot, limited to breakdown of skin Active Problems:   End stage renal disease   Essential hypertension   DM type 2, uncontrolled, with renal complications   Hypothyroidism  Ms. Lechtenberg is a 58 year old female with DM2, ESRD, HTN, hypothyroidism who is hospitalized for ischemic ulcer of R foot with cellulitis and osteomyelitis found to have stenosing flexor synovitis of L 3rd digit.   Ischemic ulcer of R foot with cellulitis and osteomyelitis: Confirmed by findings of R foot MRI yesterday. Ortho recommend consult to vascular surgery for arteriogram. Her pain medicine was increased yesterday: Dilaudid 0.5mg  q4h->0.5-1mg  q2h prn for severe pain. -Follow-up blood cultures -Continue vancomycin & Zosyn for polymicrobial coverage (Abx Day 2)  Hyponatremia: Na 134, stable from yesterday. Serum osm this morning 304. Likely 2/2 hyperglycemia with glucose 227. Continue following.  Anion gap metabolic acidosis: AG 27, up from 22. Likely 2/2 renal disease and infection. Continue following.  ESRD on HD: Receiving dialysis. Renal following.  Anemia of chronic disease:  Hb 9, stable from yesterday. Ferritin 429 today. Will receive Aranesp 60 per renal today.   Thrombocytopenia: Platelets 150, up from 126.  -Continue following  Stenosing flexor synovitis of L 3rd finger: Consult OT for more options.  CAD: Holding home meds.  Diastolic CHF: Holding home meds.  HTN: BP trending normotensive. Holding home meds.   DM2: CBGs 100s, but BMET this AM showed glucose 227. -Continue SSI-S. -Restarted Levemir 3u QHS  Hypothyroidism: Continue home meds.  FEN:  -Diet: Renal/Carb Modified  DVT prophylaxis: heparin 5000 units subcutaneous  CODE STATUS: FULL CODE  Dispo: Disposition is deferred at this time, awaiting improvement of current medical problems.  Anticipated discharge in approximately 1-2 day(s).   The patient does not have a current PCP (No primary provider on file.) and does not need an [facility] hospital follow-up appointment after discharge.  The patient does have transportation limitations that hinder transportation to clinic appointments.  .Services Needed at time of discharge: Y = Yes, Blank = No PT:   OT:   RN:   Equipment:   Other:     LOS: 2 days   Charlott Rakes, MD 01/29/2014, 7:17 AM

## 2014-01-29 NOTE — Consult Note (Signed)
Consult Note  Patient name: Brandi Arellano MRN: [id number] DOB: 01-Nov-1955 Sex: female  Consulting Physician:  Dr. Sharol Given.  Reason for Consult:  Chief Complaint  Patient presents with  . Foot Injury    HISTORY OF PRESENT ILLNESS: 58 yo female who I recently evaluated for renal access.  She underwent bilateral upperextremity angiogram followed by aortogram and bilateral lower extremity angiogram.  She now has a non-healing wound to her right foot which is extremely painful and infected.  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  On the right, her profunda and SFA were occluded.  She has collaterals all the way down her right leg until a dorsalis pedis reconstitutes on the foot.  There was a similar picture on the left.  She started HD about 2 months ago. She is right handed. She had a catheter placed in [place]. She now has dialysis on Tuesday Thursday Saturday. While at [facility], she had a right axillary to brachial artery bypass graft with propatent external ring graft.   She has undergone bilateral carotid endarterectomy and CABG.  She has previously had a left TMA and right toe amputation.  She is a diabetic with a A1c of 9.1.  She takes insulin and multiple meds for hypertension.  She is a former smoker   Past Medical History  Diagnosis Date  . CAD (coronary artery disease)   . Hypertension   . Diabetes mellitus without complication     Type II  . CKD (chronic kidney disease) stage 4, GFR 15-29 ml/min   . Hyperlipidemia   . CHF (congestive heart failure)     Acute on chronic diastolic  . UTI (lower urinary tract infection)   . Thyroid disease     Abnormal Thyroid function Test  . Anemia   . DVT (deep venous thrombosis)   . Thrombocytopenia     Past Surgical History  Procedure Laterality Date  . Coronary artery bypass graft    . Cholecystectomy    . Carotid endarterectomy      Bilateral  . Foot amputation Bilateral     History   Social History  .  Marital Status: Divorced    Spouse Name: N/A    Number of Children: N/A  . Years of Education: N/A   Occupational History  . Not on file.   Social History Main Topics  . Smoking status: Former Smoker -- 15 years    Types: Cigarettes  . Smokeless tobacco: Never Used  . Alcohol Use: No  . Drug Use: No  . Sexual Activity: Not on file   Other Topics Concern  . Not on file   Social History Narrative  . No narrative on file    Family History  Problem Relation Age of Onset  . Hypertension Father   . Heart disease Father     Coronary Artery Disease    Allergies as of 01/27/2014 - Review Complete 01/27/2014  Allergen Reaction Noted  . Diphenhydramine Nausea And Vomiting 12/06/2013  . Tramadol hcl Nausea And Vomiting 12/06/2013  . Atenolol Other (See Comments) 12/06/2013  . Iodinated diagnostic agents Other (See Comments) 12/06/2013  . Metoprolol Other (See Comments) 12/06/2013  . Morphine and related Other (See Comments) 12/06/2013  . Thiopental Other (See Comments) 12/06/2013    No current facility-administered medications on file prior to encounter.   Current Outpatient Prescriptions on File Prior to Encounter  Medication Sig Dispense Refill  . aspirin EC 81 MG tablet  Take 81 mg by mouth 2 (two) times daily.       . insulin aspart (NOVOLOG) 100 UNIT/ML injection Inject 0-15 Units into the skin 3 (three) times daily with meals.  10 mL  0  . insulin detemir (LEVEMIR) 100 UNIT/ML injection Inject 0.03 mLs (3 Units total) into the skin at bedtime.  10 mL  0  . levothyroxine (SYNTHROID, LEVOTHROID) 25 MCG tablet Take 25 mcg by mouth daily before breakfast.         Review of Systems:  Constitutional: no fevers/chills  Eyes: no vision changes  Ears, nose, mouth, throat, and face: no cough  Respiratory: no shortness of breath  Cardiovascular: no chest pain  Gastrointestinal: no nausea/vomiting, no abdominal pain, no constipation, no diarrhea  Genitourinary: no dysuria, no  hematuria  Integument: no rash  Hematologic/lymphatic: no bleeding/bruising, no edema  Musculoskeletal: no arthralgias, no myalgias, +right foot pain  Neurological: no paresthesias, no weakness   PHYSICAL EXAMINATION: General: The patient appears their stated age.  Vital signs are BP 107/63  Pulse 91  Temp(Src) 99.1 F (37.3 C) (Oral)  Resp 18  Ht 5\' 3"  (1.6 m)  Wt 124 lb 9 oz (56.5 kg)  BMI 22.07 kg/m2  SpO2 96% Pulmonary: Respirations are non-labored HEENT:  No gross abnormalities Abdomen: Soft and non-tender  Musculoskeletal: left TMA, right grate toe amps.   Neurologic: No focal weakness or paresthesias are detected, Skin: necrotic ulcer on lateral side of left foot with erythema extending up on the dorsum of the foot Psychiatric: The patient has normal affect. Cardiovascular: There is a regular rate and rhythm without significant murmur appreciated.  No palpable pedal pulses    Assessment:  Atherosclerosis with non healing infected ulcer on the right Plan: Based on angiography, the patient has the clinical picture of Buergers disease.  Most likely she will require a proximal amputation.  As a heroic attempt for limb salvage, she may be a candidate for femoral DP bpg, if she has adequate vein for a conduit.  I will get her vein mapped, but a doubt she will have a good vein, as it looks as if he bilateral GSV was harvested for CABG.  I discussed with the patient and family, she will likely require  A BKA vs AKA.  I will follow up after vein mapping.     Eldridge Abrahams, M.D. [facility] [place] Office: [phone] Pager:  [phone]

## 2014-01-29 NOTE — Progress Notes (Signed)
Patient ID: Brandi Arellano, female   DOB: 08-03-1955, 58 y.o.   MRN: [id number] Patient with ischemic pain right foot. She refused ABI studies yesterday. Dry gangrenous ulcer of the right fifth metatarsal head. At this time I do not know the patient has sufficient circulation to heal a fifth ray amputation.  Recommend consultation with vascular vein surgery to see if she is a candidate for arteriogram  studies.

## 2014-01-30 DIAGNOSIS — L97511 Non-pressure chronic ulcer of other part of right foot limited to breakdown of skin: Secondary | ICD-10-CM

## 2014-01-30 LAB — CBC
HEMATOCRIT: 30.1 % — AB (ref 36.0–46.0)
HEMOGLOBIN: 9.9 g/dL — AB (ref 12.0–15.0)
MCH: 31.4 pg (ref 26.0–34.0)
MCHC: 32.9 g/dL (ref 30.0–36.0)
MCV: 95.6 fL (ref 78.0–100.0)
Platelets: 167 10*3/uL (ref 150–400)
RBC: 3.15 MIL/uL — ABNORMAL LOW (ref 3.87–5.11)
RDW: 13.7 % (ref 11.5–15.5)
WBC: 7.9 10*3/uL (ref 4.0–10.5)

## 2014-01-30 LAB — RENAL FUNCTION PANEL
ANION GAP: 24 — AB (ref 5–15)
Albumin: 2.9 g/dL — ABNORMAL LOW (ref 3.5–5.2)
BUN: 40 mg/dL — ABNORMAL HIGH (ref 6–23)
CHLORIDE: 94 meq/L — AB (ref 96–112)
CO2: 22 mEq/L (ref 19–32)
Calcium: 9.1 mg/dL (ref 8.4–10.5)
Creatinine, Ser: 5.67 mg/dL — ABNORMAL HIGH (ref 0.50–1.10)
GFR, EST AFRICAN AMERICAN: 9 mL/min — AB (ref >90)
GFR, EST NON AFRICAN AMERICAN: 8 mL/min — AB (ref >90)
GLUCOSE: 244 mg/dL — AB (ref 70–99)
POTASSIUM: 4.9 meq/L (ref 3.7–5.3)
Phosphorus: 9.8 mg/dL — ABNORMAL HIGH (ref 2.3–4.6)
Sodium: 140 mEq/L (ref 137–147)

## 2014-01-30 LAB — GLUCOSE, CAPILLARY
GLUCOSE-CAPILLARY: 252 mg/dL — AB (ref 70–99)
Glucose-Capillary: 230 mg/dL — ABNORMAL HIGH (ref 70–99)
Glucose-Capillary: 249 mg/dL — ABNORMAL HIGH (ref 70–99)
Glucose-Capillary: 216 mg/dL — ABNORMAL HIGH (ref 70–99)

## 2014-01-30 MED ORDER — INSULIN DETEMIR 100 UNIT/ML ~~LOC~~ SOLN
9.0000 [IU] | Freq: Every day | SUBCUTANEOUS | Status: DC
  Administered 2014-01-30 – 2014-02-13 (×15): 9 [IU] via SUBCUTANEOUS
  Filled 2014-01-30 (×16): qty 0.09

## 2014-01-30 MED ORDER — SODIUM CHLORIDE 0.9 % IV SOLN
125.0000 mg | INTRAVENOUS | Status: DC
  Administered 2014-01-31 – 2014-02-07 (×2): 125 mg via INTRAVENOUS
  Filled 2014-01-30 (×5): qty 10

## 2014-01-30 MED ORDER — SENNOSIDES-DOCUSATE SODIUM 8.6-50 MG PO TABS
1.0000 | ORAL_TABLET | Freq: Two times a day (BID) | ORAL | Status: DC
  Administered 2014-01-30 – 2014-02-14 (×24): 1 via ORAL
  Filled 2014-01-30 (×33): qty 1

## 2014-01-30 MED ORDER — DARBEPOETIN ALFA-POLYSORBATE 40 MCG/0.4ML IJ SOLN
40.0000 ug | INTRAMUSCULAR | Status: DC
Start: 2014-01-31 — End: 2014-02-04
  Administered 2014-01-31: 40 ug via INTRAVENOUS
  Filled 2014-01-30: qty 0.4

## 2014-01-30 NOTE — Progress Notes (Signed)
[facility] Progress Note  Assessment/Plan: 1. Nonhealing right lateral foot ulcer with osteomyelitis/cellulitis - - unable to tolerate dopplers - hx severe PVD/DM- on Vanc and Zosyn/ extremely painful - IV dilaudid; seen by Dr. Trula Slade, who feels presentation c/w Buerger's dz; this was discussed with pt/family for prob amputation/bypass- if possible 2. ESRD - TTS - not able to get to edw - post HD BP drop - based on post HD wts - evaluate EDW while here; has seen [facility] in the past for permanent vascular access, but no plan yet - poor options; K today 4.9 not much change considering HD yesterday - Cr 8 yesterday to 5.67 today 3. Hypertension/volume -BP variable Able to UF 3-4 kg max as outpt; last outpt post weight 57.4 with EDW 55 - UF 2188 Tuesday with post weight of 56.5 via bed scales 4. Anemia - Hgb 9.9 10/21 - Aranesp dosed 40 last week - had been on 60 when placed on hold - based on Sept labs, likely needs Fe - recheck with pre HD labs; Aranesp 40 10/22; Fe 54 tsat 26% ferritin 429 - surprisingly not higher; plan weekly IV Fe start 10/22 5. Metabolic bone disease - Continue Hectorol and renvela -^ dose to 2 ac yesterday- has been on 1 ac P ^ up to 9.8 - unclear why so high  6. Nutrition - renal/carb mod diet - add vitamins Alb 2.9 7. Hypothyroidism - on synthroid - needs updated dialysis med list at d/c - 8. DM - on SSI at home - need to update outpt med list at d/c  Myriam Jacobson, PA-C [place] [phone] 01/30/2014,9:52 AM  LOS: 3 days   Pt seen, examined and agree w A/P as above.  [name] MD pager [phone]    cell [phone] 01/30/2014, 12:39 PM    Subjective:   Tearful, initially told me that she didn't remember MD talking to her or family, but on further query she admitted that he did talk about possible surgery.  Objective Filed Vitals:   01/29/14 1158 01/29/14 1730 01/29/14 2106 01/30/14 0451  BP: 109/64 107/63 127/64 135/57   Pulse: 83 91 82 76  Temp: 98.6 F (37 C) 99.1 F (37.3 C) 98.3 F (36.8 C) 99 F (37.2 C)  TempSrc: Oral Oral Oral Oral  Resp: 16 18 18 18   Height:      Weight:   56.501 kg (124 lb 9 oz)   SpO2: 96% 97% 95% 93%   Physical Exam General: crying in pain Heart: RRR 2/6 murmur Lungs: grossly clear Abdomen: soft NT Extremities: no LE edema; left transmet; right lateral ulcer dark, mild edema with sig. erythema Dialysis Access: right I-J  Dialysis Orders: [facility] TTS 4 hr 160 Optiflux 400/600 Heparin 5400 EDW 55 2K 2.25 Ca right I-J profile 4 Aranesp 40 last given 10/15 - first dose after being on hold x 1 month - Hgb 9.2 10/15 ferritin 158 sat 23% 9/26 - not treated with IV Fe because at that time Hgb was 12; iPTH 268 8/8 on Hectorol 1 - last P 5.5   Additional Objective Labs: Basic Metabolic Panel:  Recent Labs Lab 01/27/14 2253 01/28/14 0533 01/29/14 0720 01/30/14 0530  NA 129* 135* 134* 140  K 4.2 4.6 5.0 4.9  CL 86* 89* 87* 94*  CO2 23 24 20 22   GLUCOSE 306* 133* 227* 244*  BUN 47* 53* 66* 40*  CREATININE 5.46* 6.23* 8.07* 5.67*  CALCIUM 8.1* 8.3* 8.0* 9.1  PHOS 6.8*  --  8.7* 9.8*   Liver Function Tests:  Recent Labs Lab 01/29/14 0720 01/30/14 0530  ALBUMIN 2.9* 2.9*  CBC:  Recent Labs Lab 01/27/14 1427 01/28/14 0533 01/29/14 0720 01/30/14 0530  WBC 7.8 6.0 8.9 7.9  HGB 10.7* 9.6* 9.5* 9.9*  HCT 31.9* 29.0* 29.5* 30.1*  MCV 91.7 91.8 93.4 95.6  PLT 139* 126* 150 167   Blood Culture    Component Value Date/Time   SDES BLOOD RIGHT HAND 01/27/2014 1722   SPECREQUEST BOTTLES DRAWN AEROBIC AND ANAEROBIC 5CC EA 01/27/2014 1722   CULT  Value:        BLOOD CULTURE RECEIVED NO GROWTH TO DATE CULTURE WILL BE HELD FOR 5 DAYS BEFORE ISSUING A FINAL NEGATIVE REPORT Performed at [facility] 01/27/2014 1722   REPTSTATUS PENDING 01/27/2014 1722   CBG:  Recent Labs Lab 01/28/14 2038 01/29/14 1157 01/29/14 1645 01/29/14 2104 01/30/14 0730  GLUCAP  173* 108* 192* 229* 230*   Iron Studies:  Recent Labs  01/29/14 0720  IRON 54  TIBC 210*  FERRITIN 429*  Medications:   . [START ON 01/31/2014] darbepoetin (ARANESP) injection - DIALYSIS  60 mcg Intravenous Q Thu-HD  . doxercalciferol  1 mcg Intravenous Q T,Th,Sa-HD  . insulin aspart  0-9 Units Subcutaneous TID WC  . insulin detemir  3 Units Subcutaneous QHS  . levothyroxine  25 mcg Oral QAC breakfast  . multivitamin  1 tablet Oral QHS  . piperacillin-tazobactam (ZOSYN)  IV  2.25 g Intravenous 3 times per day  . sevelamer carbonate  1,600 mg Oral TID WC  . sodium chloride  3 mL Intravenous Q12H  . vancomycin  500 mg Intravenous Q T,Th,Sa-HD

## 2014-01-30 NOTE — Progress Notes (Addendum)
VASCULAR LAB PRELIMINARY  PRELIMINARY  PRELIMINARY  PRELIMINARY  Right  Upper Extremity Vein Map    Cephalic  Segment Diameter Depth Comment  1. Axilla  mm mm Too small to measure  2. Mid upper arm mm mm Too small to measure  3. Above AC mm mm Too small to measure  4. In AC 1.0 mm mm Too small to measure  5. Below AC mm mm Too small to measure  6. Mid forearm mm mm Too small to measure  7. Wrist mm mm Too small to measure   mm mm    mm mm    mm mm    Basilic  Segment Diameter Depth Comment  1. Above AC 2.1 mm 11 mm   2. In AC mm mm Too small to measure  3. Below AC mm mm Too small to measure   mm mm    mm mm    mm mm    mm mm    mm mm    mm mm    mm mm    VASCULAR LAB PRELIMINARY  PRELIMINARY  PRELIMINARY  PRELIMINARY  Left Upper Extremity Vein Map    Cephalic  Segment Diameter Depth Comment  1. Axilla mm mm Too small to measure  2. Mid upper arm mm mm Too small to measure  3. Above AC mm mm Too small to measure  4. In AC mm mm Too small to measure  5. Below AC 1.5 mm mm   6. Mid forearm 1.0 mm mm   7. Wrist mm mm Too small to measure   mm mm    mm mm    mm mm    Basilic  Segment Diameter Depth Comment  1. Axilla 2.1 mm 13 mm   2. Mid upper arm 2.3 mm 13 mm Branch   3. Above AC mm mm Small  4. In AC 3.2 mm 10 mm   5. Below AC mm mm Too small to measure   mm mm    mm mm    mm mm    mm mm    mm mm    Right Lower Extremity Vein Map    Right Great Saphenous Vein   Segment Diameter Comment  1. Origin 2.7 mm   2. High Thigh mm Too small to measure  3. Mid Thigh mm Too small to measure  4. Low Thigh mm Too small to measure  5. At Knee mm Too small to measure  6. High Calf 1.2 mm   7. Low Calf 2.1 mm   8. Ankle 1.6 mm    mm    mm    mm     Right Small Saphenous Vein  Segment Diameter Comment  1. Origin mm Too small to measure  2. High Calf mm Too small to measure  3. Low Calf mm Too small to measure  4. Ankle mm Too small to  measure   mm    mm    mm      Left Lower Extremity Vein Map    Left Great Saphenous Vein   Segment Diameter Comment  1. Origin 2.1 mm   2. High Thigh mm Too small to measure  3. Mid Thigh mm Too small to measure  4. Low Thigh mm Too small to measure  5. At Knee mm Too small to measure  6. High Calf mm Too small to measure  7. Low Calf 1.0 mm   8. Ankle  1.0 mm    mm    mm    mm     Left Small Saphenous Vein  Segment Diameter Comment  1. Origin mm Too small to measure  2. High Calf mm Too small to measure  3. Low Calf mm Too small to measure  4. Ankle mm Too small to measure   mm    mm    mm     Lilienne Weins, RVT 01/30/2014, 10:45 AM

## 2014-01-30 NOTE — Progress Notes (Signed)
Subjective: This AM, she was tearful and crying in pain. We made RN aware to give her pain medication. I attempted to see her again in the afternoon at which point she was a bit more sleepy but still complaining of pain. She did not express understanding of what is to happen next with her leg, and I attempted to explain her options.   Objective: Vital signs in last 24 hours: Filed Vitals:   01/29/14 1730 01/29/14 2106 01/30/14 0451 01/30/14 0957  BP: 107/63 127/64 135/57 144/57  Pulse: 91 82 76 89  Temp: 99.1 F (37.3 C) 98.3 F (36.8 C) 99 F (37.2 C) 99.2 F (37.3 C)  TempSrc: Oral Oral Oral Oral  Resp: 18 18 18 22   Height:      Weight:  124 lb 9 oz (56.501 kg)    SpO2: 97% 95% 93% 94%   Weight change: 2 lb 4.7 oz (1.04 kg)  Intake/Output Summary (Last 24 hours) at 01/30/14 1317 Last data filed at 01/29/14 2150  Gross per 24 hour  Intake      3 ml  Output      0 ml  Net      3 ml   General: resting in bed, NAD HEENT: PERRL, EOMI, no scleral icterus Cardiac:  unable to examine her Pulm: unable to examine her Ext: L foot without toes covered in sock with several calluses present on lateral aspect, R foot with erythema surrounding wound of fifth metatarsal consistent with focal necrosis without purulent drainage and another area of granulation on lateral plantar surface; erythema has not advanced past the line demarcated from yesterday though foot is now appearing more dusky; unable to palpate R PT/DP pulses; L middle finger flexed and resistant to extension Neuro:responds to questions appropriately; moving all extremities freely   Lab Results: Basic Metabolic Panel:  Recent Labs Lab 01/29/14 0720 01/30/14 0530  NA 134* 140  K 5.0 4.9  CL 87* 94*  CO2 20 22  GLUCOSE 227* 244*  BUN 66* 40*  CREATININE 8.07* 5.67*  CALCIUM 8.0* 9.1  PHOS 8.7* 9.8*   CBC:  Recent Labs Lab 01/29/14 0720 01/30/14 0530  WBC 8.9 7.9  HGB 9.5* 9.9*  HCT 29.5* 30.1*  MCV 93.4  95.6  PLT 150 167   CBG:  Recent Labs Lab 01/28/14 2038 01/29/14 1157 01/29/14 1645 01/29/14 2104 01/30/14 0730 01/30/14 1307  GLUCAP 173* 108* 192* 229* 230* 249*    Micro Results: Recent Results (from the past 240 hour(s))  CULTURE, BLOOD (ROUTINE X 2)     Status: None   Collection Time    01/27/14  5:05 PM      Result Value Ref Range Status   Specimen Description BLOOD LEFT FOREARM   Final   Special Requests BOTTLES DRAWN AEROBIC ONLY 4CC   Final   Culture  Setup Time     Final   Value: 01/27/2014 22:34     Performed at [facility]   Culture     Final   Value:        BLOOD CULTURE RECEIVED NO GROWTH TO DATE CULTURE WILL BE HELD FOR 5 DAYS BEFORE ISSUING A FINAL NEGATIVE REPORT     Performed at [facility]   Report Status PENDING   Incomplete  CULTURE, BLOOD (ROUTINE X 2)     Status: None   Collection Time    01/27/14  5:22 PM      Result Value Ref Range Status  Specimen Description BLOOD RIGHT HAND   Final   Special Requests BOTTLES DRAWN AEROBIC AND ANAEROBIC 5CC EA   Final   Culture  Setup Time     Final   Value: 01/27/2014 22:33     Performed at [facility]   Culture     Final   Value:        BLOOD CULTURE RECEIVED NO GROWTH TO DATE CULTURE WILL BE HELD FOR 5 DAYS BEFORE ISSUING A FINAL NEGATIVE REPORT     Performed at [facility]   Report Status PENDING   Incomplete  MRSA PCR SCREENING     Status: None   Collection Time    01/28/14  4:24 AM      Result Value Ref Range Status   MRSA by PCR NEGATIVE  NEGATIVE Final   Comment:            The GeneXpert MRSA Assay (FDA     approved for NASAL specimens     only), is one component of a     comprehensive MRSA colonization     surveillance program. It is not     intended to diagnose MRSA     infection nor to guide or     monitor treatment for     MRSA infections.   Studies/Results: Mr Foot Right Wo Contrast  01/28/2014   CLINICAL DATA:  Open wound on the lateral aspect  of the right foot since last Monday. Stepped on wood chips.  EXAM: MRI OF THE RIGHT FOREFOOT WITHOUT CONTRAST  TECHNIQUE: Multiplanar, multisequence MR imaging was performed. No intravenous contrast was administered.  COMPARISON:  Radiographs 01/27/2014  FINDINGS: Examination is extremely limited due to patient motion. The patient would not or could not hold still for the exam.  There is a large fluid collection surrounding the fifth metatarsal worrisome for an abscess. There is also signal abnormality in the fifth metatarsal shaft suspicious for osteomyelitis. No obvious changes of septic arthritis.  IMPRESSION: Very limited examination but findings consistent with an abscess surrounding the fifth metatarsal and fifth metatarsal osteomyelitis.   Electronically Signed   By: Kalman Jewels M.D.   On: 01/28/2014 17:12   Medications: I have reviewed the patient's current medications. Scheduled Meds: . [START ON 01/31/2014] darbepoetin (ARANESP) injection - DIALYSIS  40 mcg Intravenous Q Thu-HD  . doxercalciferol  1 mcg Intravenous Q T,Th,Sa-HD  . [START ON 01/31/2014] ferric gluconate (FERRLECIT/NULECIT) IV  125 mg Intravenous Q Thu-HD  . insulin aspart  0-9 Units Subcutaneous TID WC  . insulin detemir  3 Units Subcutaneous QHS  . levothyroxine  25 mcg Oral QAC breakfast  . multivitamin  1 tablet Oral QHS  . piperacillin-tazobactam (ZOSYN)  IV  2.25 g Intravenous 3 times per day  . sevelamer carbonate  1,600 mg Oral TID WC  . sodium chloride  3 mL Intravenous Q12H  . vancomycin  500 mg Intravenous Q T,Th,Sa-HD   Continuous Infusions:  PRN Meds:.HYDROmorphone (DILAUDID) injection, ondansetron (ZOFRAN) IV, ondansetron Assessment/Plan: Principal Problem:   Ischemic ulcer of right foot, limited to breakdown of skin Active Problems:   End stage renal disease   Essential hypertension   DM type 2, uncontrolled, with renal complications   Hypothyroidism  Ms. Ugarte is a 58 year old female with  DM2, ESRD, HTN, hypothyroidism who is hospitalized for ischemic ulcer of R foot with cellulitis and osteomyelitis found to have stenosing flexor synovitis of L 3rd digit.   Ischemic ulcer of R  foot with cellulitis and osteomyelitis: Per vascular, she will need below-knee vs above-knee amputation given poor vascular flow seen on vein mapping. -Continue Dilaudid 0.5-1mg  q2h prn for severe pain -Follow-up blood cultures -Continue vancomycin & Zosyn for polymicrobial coverage (Abx Day 3) -Vascular following, appreciate recs  Hyponatremia: Na 140, up from 134. Resolved but unlikely due to hyperglycemia as glucose is persistently in 200s. Continue following.  Anion gap metabolic acidosis: AG 24, improved from 27. Likely 2/2 renal disease and infection. Continue following.  ESRD on HD: Receiving dialysis. Renal following.  Anemia of chronic disease: Hb 9, stable from yesterday.    Stenosing flexor synovitis of L 3rd finger: OT consult pending. May need to wait until management of ischemic wound.  CAD: Holding home meds.  Diastolic CHF: Holding home meds.  HTN: BP trending normotensive. Holding home meds.   DM2: CBGs trending 200s, -Increase Levermir 3u->9u QHS -Continue SSI-S.  Hypothyroidism: Continue home meds.  FEN:  -Diet: Renal/Carb Modified  DVT prophylaxis: heparin 5000 units subcutaneous  CODE STATUS: FULL CODE  Dispo: Disposition is deferred at this time, awaiting improvement of current medical problems.  Anticipated discharge in approximately 1-2 day(s).   The patient does not have a current PCP (No primary provider on file.) and does not need an [facility] hospital follow-up appointment after discharge.  The patient does have transportation limitations that hinder transportation to clinic appointments.  .Services Needed at time of discharge: Y = Yes, Blank = No PT:   OT:   RN:   Equipment:   Other:     LOS: 3 days   Charlott Rakes, MD 01/30/2014, 1:17 PM

## 2014-01-30 NOTE — Progress Notes (Signed)
The patient does not have adequate vein for bypass, and therefore I would not offer her revascularization, as it will not provide any durable result.  She will be best treated with AKA vs BKA  WElls Almus Woodham,

## 2014-01-30 NOTE — Progress Notes (Addendum)
ANTIBIOTIC CONSULT NOTE - FOLLOW UP  Pharmacy Consult for vancomycin and zosyn Indication: right foot ulcer abscess, osteomyelitis  Allergies  Allergen Reactions  . Diphenhydramine Nausea And Vomiting  . Tramadol Hcl Nausea And Vomiting  . Atenolol Other (See Comments)    unknown  . Iodinated Diagnostic Agents Other (See Comments)    unknown  . Metoprolol Other (See Comments)    unknown  . Morphine And Related Other (See Comments)    unknown  . Thiopental Other (See Comments)    Other     Patient Measurements: Height: 5\' 3"  (160 cm) Weight: 124 lb 9 oz (56.501 kg) IBW/kg (Calculated) : 52.4   Vital Signs: Temp: 99.2 F (37.3 C) (10/21 0957) Temp Source: Oral (10/21 0957) BP: 144/57 mmHg (10/21 0957) Pulse Rate: 89 (10/21 0957) Intake/Output from previous day: 10/20 0701 - 10/21 0700 In: 3 [I.V.:3] Out: 2188  Intake/Output from this shift:    Labs:  Recent Labs  01/28/14 0533 01/29/14 0720 01/30/14 0530  WBC 6.0 8.9 7.9  HGB 9.6* 9.5* 9.9*  PLT 126* 150 167  CREATININE 6.23* 8.07* 5.67*   Estimated Creatinine Clearance: 9.2 ml/min (by C-G formula based on Cr of 5.67). No results found for this basename: VANCOTROUGH, VANCOPEAK, VANCORANDOM, GENTTROUGH, GENTPEAK, GENTRANDOM, TOBRATROUGH, TOBRAPEAK, TOBRARND, AMIKACINPEAK, AMIKACINTROU, AMIKACIN,  in the last 72 hours   Microbiology: Recent Results (from the past 720 hour(s))  CULTURE, BLOOD (ROUTINE X 2)     Status: None   Collection Time    01/27/14  5:05 PM      Result Value Ref Range Status   Specimen Description BLOOD LEFT FOREARM   Final   Special Requests BOTTLES DRAWN AEROBIC ONLY 4CC   Final   Culture  Setup Time     Final   Value: 01/27/2014 22:34     Performed at [facility]   Culture     Final   Value:        BLOOD CULTURE RECEIVED NO GROWTH TO DATE CULTURE WILL BE HELD FOR 5 DAYS BEFORE ISSUING A FINAL NEGATIVE REPORT     Performed at [facility]   Report Status PENDING    Incomplete  CULTURE, BLOOD (ROUTINE X 2)     Status: None   Collection Time    01/27/14  5:22 PM      Result Value Ref Range Status   Specimen Description BLOOD RIGHT HAND   Final   Special Requests BOTTLES DRAWN AEROBIC AND ANAEROBIC 5CC EA   Final   Culture  Setup Time     Final   Value: 01/27/2014 22:33     Performed at [facility]   Culture     Final   Value:        BLOOD CULTURE RECEIVED NO GROWTH TO DATE CULTURE WILL BE HELD FOR 5 DAYS BEFORE ISSUING A FINAL NEGATIVE REPORT     Performed at [facility]   Report Status PENDING   Incomplete  MRSA PCR SCREENING     Status: None   Collection Time    01/28/14  4:24 AM      Result Value Ref Range Status   MRSA by PCR NEGATIVE  NEGATIVE Final   Comment:            The GeneXpert MRSA Assay (FDA     approved for NASAL specimens     only), is one component of a     comprehensive MRSA colonization  surveillance program. It is not     intended to diagnose MRSA     infection nor to guide or     monitor treatment for     MRSA infections.    Anti-infectives   Start     Dose/Rate Route Frequency Ordered Stop   01/29/14 1200  vancomycin (VANCOCIN) 500 mg in sodium chloride 0.9 % 100 mL IVPB     500 mg 100 mL/hr over 60 Minutes Intravenous Every T-Th-Sa (Hemodialysis) 01/27/14 1623     01/27/14 1900  piperacillin-tazobactam (ZOSYN) IVPB 2.25 g     2.25 g 100 mL/hr over 30 Minutes Intravenous 3 times per day 01/27/14 1832     01/27/14 1630  vancomycin (VANCOCIN) 1,250 mg in sodium chloride 0.9 % 250 mL IVPB     1,250 mg 166.7 mL/hr over 90 Minutes Intravenous  Once 01/27/14 1623 01/27/14 2013      Assessment: Patient is a 58 y.o F with ESRD on HD currently on vancomycin and zosyn for abscess and osteomyelitis of fifth metatarsal.  VVS to proceed with vein mapping to determine course of treatment.  Patient received HD yesterday for ~4 hrs, BFR 250 with vancomycin 500mg  given post session.  Vanc 10/19> Zosyn  10/19>  10/18 bcx x2>> ngtd   Goal of Therapy:  Pre-HD vacomycin level 15-25  Plan:  - cont Vancomycin 500 mg post HD-T/Th/Sat  - cont Zosyn 2.25 q8h - f/u culture  - f/u VVS recom  Shashana Fullington P 01/30/2014,10:49 AM

## 2014-01-30 NOTE — Progress Notes (Signed)
  Date: 01/30/2014  Patient name: Brandi Arellano  Medical record number: [id number]  Date of birth: 1955/09/28   This patient has been seen and the plan of care was discussed with the house staff. Please see their note for complete details. I concur with their findings with the following additions/corrections:  Will need to consult orthopedics for possible amputation; right foot wound appears more dusky today and is exquisitely tender.  Will continue to attempt to discuss options with patient however, both times she was seen today she was either tearful in pain or somnolent from medications.   Sid Falcon, MD 01/30/2014, 4:01 PM

## 2014-01-30 NOTE — Evaluation (Signed)
Occupational Therapy Evaluation Patient Details Name: Brandi Arellano MRN: [id number] DOB: 1955/12/12 Today's Date: 01/30/2014    History of Present Illness Pt referred specifically for evaluation of trigger finger of L long finger. Pt admitted for ischemic ulcer of R foot. PMH:  CAD, HTn, DM, ESRD, UTI, anemia, DVT, s/p CABG.   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  Pt reports this is long-standing and appears as is typical for her.  Pt reports that the finger gets in the way of her hand use, but has neither improved nor worsened in a long time.  No OT intervention is warranted.  Pt may benefit from referral to an orthopedist specializing in hand surgery.    Follow Up Recommendations  No OT follow up    Equipment Recommendations       Recommendations for Other Services       Precautions / Restrictions Restrictions Weight Bearing Restrictions: No      Mobility Bed Mobility                  Transfers                      Balance                                            ADL                                               Vision                     Perception     Praxis      Pertinent Vitals/Pain Pain Assessment: 0-10 Pain Score: 10-Worst pain ever Pain Location: R foot Pain Descriptors / Indicators: Aching Pain Intervention(s): Repositioned;Monitored during session;Relaxation;Patient requesting pain meds-RN notified     Hand Dominance Right   Extremity/Trunk Assessment Upper Extremity Assessment Upper Extremity Assessment: RUE deficits/detail;LUE deficits/detail RUE Deficits / Details: generalized weakness LUE Deficits / Details: generalized weakness, swelling of PIP L long finger with joint contracture at 90 degrees. LUE Coordination: decreased fine motor           Communication Communication Communication: No difficulties   Cognition  Arousal/Alertness: Lethargic Behavior During Therapy: Flat affect Overall Cognitive Status: Difficult to assess                     General Comments       Exercises       Shoulder Instructions      Home Living Family/patient expects to be discharged to:: Private residence Living Arrangements: Other relatives (brother)                                      Prior Functioning/Environment               OT Diagnosis:     OT Problem List:     OT Treatment/Interventions:      OT Goals(Current goals can be found in the care plan section) Acute Rehab OT Goals Patient Stated Goal: pain relief  OT Frequency:  Barriers to D/C:            Co-evaluation              End of Session Nurse Communication: Patient requests pain meds  Activity Tolerance: Patient limited by pain Patient left: in bed;with call bell/phone within reach   Time: 1350-1407 OT Time Calculation (min): 17 min Charges:  OT General Charges $OT Visit: 1 Procedure OT Evaluation $Initial OT Evaluation Tier I: 1 Procedure G-Codes:    Malka So 01/30/2014, 2:09 PM [phone]

## 2014-01-31 ENCOUNTER — Other Ambulatory Visit (HOSPITAL_COMMUNITY): Payer: Self-pay | Admitting: Orthopedic Surgery

## 2014-01-31 LAB — RENAL FUNCTION PANEL
ANION GAP: 26 — AB (ref 5–15)
Albumin: 2.7 g/dL — ABNORMAL LOW (ref 3.5–5.2)
BUN: 58 mg/dL — AB (ref 6–23)
CO2: 20 mEq/L (ref 19–32)
CREATININE: 7.93 mg/dL — AB (ref 0.50–1.10)
Calcium: 9.2 mg/dL (ref 8.4–10.5)
Chloride: 89 mEq/L — ABNORMAL LOW (ref 96–112)
GFR calc Af Amer: 6 mL/min — ABNORMAL LOW (ref >90)
GFR calc non Af Amer: 5 mL/min — ABNORMAL LOW (ref >90)
GLUCOSE: 208 mg/dL — AB (ref 70–99)
PHOSPHORUS: 10.1 mg/dL — AB (ref 2.3–4.6)
POTASSIUM: 5 meq/L (ref 3.7–5.3)
Sodium: 135 mEq/L — ABNORMAL LOW (ref 137–147)

## 2014-01-31 LAB — CBC
HCT: 29.8 % — ABNORMAL LOW (ref 36.0–46.0)
Hemoglobin: 9.8 g/dL — ABNORMAL LOW (ref 12.0–15.0)
MCH: 31 pg (ref 26.0–34.0)
MCHC: 32.9 g/dL (ref 30.0–36.0)
MCV: 94.3 fL (ref 78.0–100.0)
Platelets: 204 10*3/uL (ref 150–400)
RBC: 3.16 MIL/uL — AB (ref 3.87–5.11)
RDW: 13.8 % (ref 11.5–15.5)
WBC: 9.5 10*3/uL (ref 4.0–10.5)

## 2014-01-31 LAB — GLUCOSE, CAPILLARY
GLUCOSE-CAPILLARY: 226 mg/dL — AB (ref 70–99)
Glucose-Capillary: 308 mg/dL — ABNORMAL HIGH (ref 70–99)
Glucose-Capillary: 77 mg/dL (ref 70–99)
Glucose-Capillary: 138 mg/dL — ABNORMAL HIGH (ref 70–99)

## 2014-01-31 MED ORDER — PENTAFLUOROPROP-TETRAFLUOROETH EX AERO: 1.0000 "application " | INHALATION_SPRAY | CUTANEOUS | Status: DC | PRN

## 2014-01-31 MED ORDER — HEPARIN SODIUM (PORCINE) 1000 UNIT/ML DIALYSIS
20.0000 [IU]/kg | INTRAMUSCULAR | Status: DC | PRN
  Filled 2014-01-31: qty 2

## 2014-01-31 MED ORDER — LIDOCAINE-PRILOCAINE 2.5-2.5 % EX CREA: 1.0000 "application " | TOPICAL_CREAM | CUTANEOUS | Status: DC | PRN

## 2014-01-31 MED ORDER — OXYCODONE HCL 5 MG PO TABS
ORAL_TABLET | ORAL | Status: AC
  Administered 2014-01-31: 5 mg via ORAL
  Filled 2014-01-31: qty 1

## 2014-01-31 MED ORDER — DOXERCALCIFEROL 4 MCG/2ML IV SOLN
INTRAVENOUS | Status: AC
  Administered 2014-01-31: 1 ug via INTRAVENOUS
  Filled 2014-01-31: qty 2

## 2014-01-31 MED ORDER — HEPARIN SODIUM (PORCINE) 1000 UNIT/ML DIALYSIS
1000.0000 [IU] | INTRAMUSCULAR | Status: DC | PRN
  Filled 2014-01-31: qty 1

## 2014-01-31 MED ORDER — OXYCODONE HCL ER 10 MG PO T12A
20.0000 mg | EXTENDED_RELEASE_TABLET | Freq: Two times a day (BID) | ORAL | Status: DC
Start: 2014-01-31 — End: 2014-02-02
  Administered 2014-01-31 – 2014-02-02 (×5): 20 mg via ORAL
  Filled 2014-01-31 (×5): qty 2

## 2014-01-31 MED ORDER — ALTEPLASE 2 MG IJ SOLR
2.0000 mg | Freq: Once | INTRAMUSCULAR | Status: DC | PRN
  Filled 2014-01-31: qty 2

## 2014-01-31 MED ORDER — OXYCODONE HCL 5 MG PO TABS
5.0000 mg | ORAL_TABLET | Freq: Four times a day (QID) | ORAL | Status: DC | PRN
  Administered 2014-01-31 – 2014-02-14 (×21): 5 mg via ORAL
  Filled 2014-01-31 (×19): qty 1

## 2014-01-31 MED ORDER — CHLORHEXIDINE GLUCONATE 4 % EX LIQD
60.0000 mL | Freq: Once | CUTANEOUS | Status: DC
  Filled 2014-01-31 (×2): qty 60

## 2014-01-31 MED ORDER — CHLORHEXIDINE GLUCONATE 4 % EX LIQD
60.0000 mL | Freq: Once | CUTANEOUS | Status: DC
  Filled 2014-01-31: qty 60

## 2014-01-31 MED ORDER — SODIUM CHLORIDE 0.9 % IV SOLN: 100.0000 mL | INTRAVENOUS | Status: DC | PRN

## 2014-01-31 MED ORDER — NEPRO/CARBSTEADY PO LIQD: 237.0000 mL | ORAL | Status: DC | PRN

## 2014-01-31 MED ORDER — CEFAZOLIN SODIUM-DEXTROSE 2-3 GM-% IV SOLR
2.0000 g | INTRAVENOUS | Status: AC
  Administered 2014-02-01: 2 g via INTRAVENOUS
  Filled 2014-01-31: qty 50

## 2014-01-31 MED ORDER — LIDOCAINE HCL (PF) 1 % IJ SOLN: 5.0000 mL | INTRAMUSCULAR | Status: DC | PRN

## 2014-01-31 MED ORDER — DARBEPOETIN ALFA-POLYSORBATE 40 MCG/0.4ML IJ SOLN
INTRAMUSCULAR | Status: AC
  Administered 2014-01-31: 40 ug via INTRAVENOUS
  Filled 2014-01-31: qty 0.4

## 2014-01-31 NOTE — Progress Notes (Signed)
  Date: 01/31/2014  Patient name: Brandi Arellano  Medical record number: [id number]  Date of birth: 21-Apr-1955   This patient has been seen and the plan of care was discussed with the house staff. Please see their note for complete details. I concur with their findings with the following additions/corrections:  Patient will be switched to a long acting narcotic with breakthrough as she is requiring large doses of dilaudid for her ischemic foot pain.  Ortho plans for BKA vs. AKA tomorrow in the OR.   Sid Falcon, MD 01/31/2014, 4:15 PM

## 2014-01-31 NOTE — Progress Notes (Signed)
Patient ID: Brandi Arellano, female   DOB: November 19, 1955, 58 y.o.   MRN: [id number] Right foot with increasing cellulitis and ischemic ulceration fifth metatarsal. Patient is not a revascularization candidate per  vascular vein surgery. Will plan for a transtibial amputation tomorrow afternoon Friday. Patient may require an above-the-knee amputation but limb salvage will be attempted to maximize her ability to ambulate.

## 2014-01-31 NOTE — Progress Notes (Signed)
Paged IV team,  IV infiltrated.

## 2014-01-31 NOTE — Progress Notes (Signed)
[facility] Progress Note  Assessment/Plan: 1. Nonhealing right lateral foot ulcer with osteomyelitis/cellulitis with prob Buerger's dz- - unable to tolerate dopplers - hx severe PVD/DM- on Vanc and Zosyn/ extremely painful - IV dilaudid; seen by Drs. Sharol Given and [place] - non surg candidate -for BKA Friday afternoon - worse on exam today 2. ESRD - TTS - Has benn seen in the past for permanent vascular access, but no plan yet - poor options; K 5 - reassess edw post surgery had not been getting to outpt edw - for HD today 3. Hypertension/volume -BP variable Able to UF 3-4 kg max as outpt; last outpt post weight 57.4 with EDW 55 - UF 2188 Tuesday with post weight of 56.5 via bed scales 4. Anemia - Hgb 9.8 10/22 - stable - Aranesp dosed 40 last week - had been on 60 when placed on hold - based on Sept labs, likely needs Fe - recheck with pre HD labs; Aranesp 40 10/22; Fe 54 tsat 26% ferritin 429 - surprisingly not higher; plan weekly IV Fe start 10/22 5. Metabolic bone disease - Continue Hectorol and renvela -^ dose to 2 ac - has been on 1 ac P ^ up to 10. -refusing secondary to nausea - hopefully this will improve after surgery - ok to hold now 6. Nutrition - renal/carb mod diet - add vitamins Alb 2.9 7. Hypothyroidism - on synthroid - needs updated dialysis med list at d/c - 8. DM - on SSI at home - need to update outpt med list at d/c 9. Pain/ nausea - per Rosamaria Lints, PA-C [place] [phone] 01/31/2014,10:16 AM  LOS: 4 days   Pt seen, examined and agree w A/P as above. For BKA vs AKA tomorrow. Plan HD today.  [name] MD pager [phone]    cell [phone] 01/31/2014, 11:32 AM    Subjective:   Renvela normally doesn't cause nausea; thinks nausea is due to pain meds  Objective Filed Vitals:   01/30/14 2049 01/30/14 2102 01/31/14 0502 01/31/14 0936  BP: 136/73  144/74 174/71  Pulse:   76 81  Temp:   98.6 F (37 C) 98.7 F (37.1  C)  TempSrc:   Oral Oral  Resp:   17 18  Height:      Weight:      SpO2: 88% 98% 93% 96%   Physical Exam General:  Crying with pain and nausea Heart: RRR 2/6 murmur Lungs: no wheezes/rales Abdomen: soft Extremities: left transmet well healed - no edema; right lateral ulcer/cellulitis - increasing erythema extremely painful Dialysis Access: right IJ  Dialysis Orders:  Additional Objective Labs: Basic Metabolic Panel:  Recent Labs Lab 01/29/14 0720 01/30/14 0530 01/31/14 0647  NA 134* 140 135*  K 5.0 4.9 5.0  CL 87* 94* 89*  CO2 20 22 20   GLUCOSE 227* 244* 208*  BUN 66* 40* 58*  CREATININE 8.07* 5.67* 7.93*  CALCIUM 8.0* 9.1 9.2  PHOS 8.7* 9.8* 10.1*   Liver Function Tests:  Recent Labs Lab 01/29/14 0720 01/30/14 0530 01/31/14 0647  ALBUMIN 2.9* 2.9* 2.7*   CBC:  Recent Labs Lab 01/27/14 1427 01/28/14 0533 01/29/14 0720 01/30/14 0530 01/31/14 0647  WBC 7.8 6.0 8.9 7.9 9.5  HGB 10.7* 9.6* 9.5* 9.9* 9.8*  HCT 31.9* 29.0* 29.5* 30.1* 29.8*  MCV 91.7 91.8 93.4 95.6 94.3  PLT 139* 126* 150 167 204   Blood Culture    Component Value Date/Time   SDES BLOOD RIGHT  HAND 01/27/2014 1722   SPECREQUEST BOTTLES DRAWN AEROBIC AND ANAEROBIC 5CC EA 01/27/2014 1722   CULT  Value:        BLOOD CULTURE RECEIVED NO GROWTH TO DATE CULTURE WILL BE HELD FOR 5 DAYS BEFORE ISSUING A FINAL NEGATIVE REPORT Performed at [place] 01/27/2014 1722   REPTSTATUS PENDING 01/27/2014 1722   Cardiac Enzymes: CBG:  Recent Labs Lab 01/30/14 0730 01/30/14 1307 01/30/14 1711 01/30/14 2047 01/31/14 0752  GLUCAP 230* 249* 216* 252* 226*   Iron Studies:  Recent Labs  01/29/14 0720  IRON 54  TIBC 210*  FERRITIN 429*  Medications:   . darbepoetin (ARANESP) injection - DIALYSIS  40 mcg Intravenous Q Thu-HD  . doxercalciferol  1 mcg Intravenous Q T,Th,Sa-HD  . ferric gluconate (FERRLECIT/NULECIT) IV  125 mg Intravenous Q Thu-HD  . insulin aspart  0-9 Units  Subcutaneous TID WC  . insulin detemir  9 Units Subcutaneous QHS  . levothyroxine  25 mcg Oral QAC breakfast  . multivitamin  1 tablet Oral QHS  . piperacillin-tazobactam (ZOSYN)  IV  2.25 g Intravenous 3 times per day  . senna-docusate  1 tablet Oral BID  . sevelamer carbonate  1,600 mg Oral TID WC  . sodium chloride  3 mL Intravenous Q12H  . vancomycin  500 mg Intravenous Q T,Th,Sa-HD

## 2014-01-31 NOTE — Progress Notes (Signed)
Subjective: This AM, she appears better though is complaining of some pain. She acknowledged understanding of surgical management though unsure if she truly understands given her groggy appearance.   Objective: Vital signs in last 24 hours: Filed Vitals:   01/30/14 2012 01/30/14 2049 01/30/14 2102 01/31/14 0502  BP: 183/72 136/73  144/74  Pulse: 91   76  Temp: 98.2 F (36.8 C)   98.6 F (37 C)  TempSrc: Oral   Oral  Resp: 18   17  Height:      Weight:      SpO2: 93% 88% 98% 93%   Weight change:   Intake/Output Summary (Last 24 hours) at 01/31/14 0736 Last data filed at 01/30/14 1200  Gross per 24 hour  Intake    100 ml  Output      0 ml  Net    100 ml   General: resting in bed, NAD HEENT: PERRL, EOMI, no scleral icterus Cardiac:  RRR Pulm: clear to auscultation bilaterally Ext: L foot without toes covered in sock with several calluses present on lateral aspect, R foot with erythema surrounding wound of fifth metatarsal consistent with focal necrosis without purulent drainage and another area of granulation on lateral plantar surface; erythema has advanced past the line demarcated from yesterday though foot with more dusky appearance; unable to palpate R PT/DP pulses Neuro:responds to questions appropriately; moving all extremities freely   Lab Results: Basic Metabolic Panel:  Recent Labs Lab 01/29/14 0720 01/30/14 0530  NA 134* 140  K 5.0 4.9  CL 87* 94*  CO2 20 22  GLUCOSE 227* 244*  BUN 66* 40*  CREATININE 8.07* 5.67*  CALCIUM 8.0* 9.1  PHOS 8.7* 9.8*   CBC:  Recent Labs Lab 01/29/14 0720 01/30/14 0530  WBC 8.9 7.9  HGB 9.5* 9.9*  HCT 29.5* 30.1*  MCV 93.4 95.6  PLT 150 167   CBG:  Recent Labs Lab 01/29/14 1645 01/29/14 2104 01/30/14 0730 01/30/14 1307 01/30/14 1711 01/30/14 2047  GLUCAP 192* 229* 230* 249* 216* 252*    Micro Results: Recent Results (from the past 240 hour(s))  CULTURE, BLOOD (ROUTINE X 2)     Status: None   Collection Time    01/27/14  5:05 PM      Result Value Ref Range Status   Specimen Description BLOOD LEFT FOREARM   Final   Special Requests BOTTLES DRAWN AEROBIC ONLY 4CC   Final   Culture  Setup Time     Final   Value: 01/27/2014 22:34     Performed at [facility]   Culture     Final   Value:        BLOOD CULTURE RECEIVED NO GROWTH TO DATE CULTURE WILL BE HELD FOR 5 DAYS BEFORE ISSUING A FINAL NEGATIVE REPORT     Performed at [facility]   Report Status PENDING   Incomplete  CULTURE, BLOOD (ROUTINE X 2)     Status: None   Collection Time    01/27/14  5:22 PM      Result Value Ref Range Status   Specimen Description BLOOD RIGHT HAND   Final   Special Requests BOTTLES DRAWN AEROBIC AND ANAEROBIC 5CC EA   Final   Culture  Setup Time     Final   Value: 01/27/2014 22:33     Performed at [place]     Final   Value:        BLOOD CULTURE  RECEIVED NO GROWTH TO DATE CULTURE WILL BE HELD FOR 5 DAYS BEFORE ISSUING A FINAL NEGATIVE REPORT     Performed at [facility]   Report Status PENDING   Incomplete  MRSA PCR SCREENING     Status: None   Collection Time    01/28/14  4:24 AM      Result Value Ref Range Status   MRSA by PCR NEGATIVE  NEGATIVE Final   Comment:            The GeneXpert MRSA Assay (FDA     approved for NASAL specimens     only), is one component of a     comprehensive MRSA colonization     surveillance program. It is not     intended to diagnose MRSA     infection nor to guide or     monitor treatment for     MRSA infections.   Studies/Results: No results found. Medications: I have reviewed the patient's current medications. Scheduled Meds: . darbepoetin (ARANESP) injection - DIALYSIS  40 mcg Intravenous Q Thu-HD  . doxercalciferol  1 mcg Intravenous Q T,Th,Sa-HD  . ferric gluconate (FERRLECIT/NULECIT) IV  125 mg Intravenous Q Thu-HD  . insulin aspart  0-9 Units Subcutaneous TID WC  . insulin detemir  9 Units  Subcutaneous QHS  . levothyroxine  25 mcg Oral QAC breakfast  . multivitamin  1 tablet Oral QHS  . piperacillin-tazobactam (ZOSYN)  IV  2.25 g Intravenous 3 times per day  . senna-docusate  1 tablet Oral BID  . sevelamer carbonate  1,600 mg Oral TID WC  . sodium chloride  3 mL Intravenous Q12H  . vancomycin  500 mg Intravenous Q T,Th,Sa-HD   Continuous Infusions:  PRN Meds:.HYDROmorphone (DILAUDID) injection, ondansetron (ZOFRAN) IV, ondansetron Assessment/Plan: Principal Problem:   Ischemic ulcer of right foot, limited to breakdown of skin Active Problems:   End stage renal disease   Essential hypertension   DM type 2, uncontrolled, with renal complications   Hypothyroidism  Brandi Arellano is a 59 year old female with DM2, ESRD, HTN, hypothyroidism who is hospitalized for ischemic ulcer of R foot with cellulitis and osteomyelitis found to have stenosing flexor synovitis of L 3rd digit.   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 She required 12mg  IV Dilaudid over 48 hours and should get basal/breakthrough dosing of oral pain medication as she can tolerate PO intake. -Switch Dilaudid to oxy SR 20mg  BID & oxy IR 5 mg q6h prn for breakthrough -Schedule Dilaudid 2mg  BID, 1mg  q12h prn for pain -Follow-up blood cultures -Continue vancomycin & Zosyn (Abx Day 4) -Ortho following, appreciate recs  Hyponatremia: Na 135, down from 140. Serum osm 302. Likely 2/2 hyperglycemia as glucose is persistently in 200s. Continue following.  Anion gap metabolic acidosis: AG 26, up from2 4. Likely 2/2 renal disease and infection. Continue following.  ESRD on HD: Receiving dialysis today. Renal following.  Anemia of chronic disease: Hb 9, stable from yesterday.    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   CAD: Holding home meds.  Diastolic CHF: Holding home meds.  HTN: BP trending normotensive. Holding home meds.   DM2: CBGs trending 200s but will be NPO tomorrow. Will defer changes until later. -Continue Levermir 9u QHS -Continue SSI-S.  Hypothyroidism: Continue home  meds.  FEN:  -Diet: Renal/Carb Modified  DVT prophylaxis: heparin 5000 units subcutaneous  CODE STATUS: FULL CODE  Dispo: Disposition is deferred at this time, awaiting improvement of current medical problems.  Anticipated discharge in approximately 1-2 day(s).   The patient does not have a current PCP (No primary provider on file.) and does not need an [facility] hospital follow-up appointment after discharge.  The patient does have transportation limitations that hinder transportation to clinic appointments.  .Services Needed at time of discharge: Y = Yes, Blank = No PT:   OT:   RN:   Equipment:   Other:     LOS: 4 days   Charlott Rakes, MD 01/31/2014, 7:36 AM

## 2014-01-31 NOTE — Progress Notes (Signed)
Subjective: The patient is resting in bed making groans in apparent pain but unable to verbalize significant pain or elucidate the cause of her apparent discomfort. Objective: Vital signs in last 24 hours: Filed Vitals:   01/31/14 1354 01/31/14 1407 01/31/14 1435 01/31/14 1455  BP: 97/70 102/70 109/66 102/73  Pulse: 72 71 69 68  Temp:      TempSrc:      Resp: _0 Height:      Weight:      SpO2:       Weight change:   Intake/Output Summary (Last 24 hours) at 01/31/14 1513 Last data filed at 01/31/14 0900  Gross per 24 hour  Intake      0 ml  Output      0 ml  Net      0 ml   BP 102/73  Pulse 68  Temp(Src) 97.8 F (36.6 C) (Oral)  Resp 14  Ht 5' 3" (1.6 m)  Wt 56 kg (123 lb 7.3 oz)  BMI 21.88 kg/m2  SpO2 100%  General Appearance:    Alert, no acute distress, groaning  Head:    Normocephalic, without obvious abnormality, atraumatic  Eyes:    No scleral icterus  Ears:    Deferred  Nose:   Deferred  Throat:   Mucous membranes pink and well perfused  Neck:   Supple, symmetrical, trachea midline; no carotid   bruit or JVD  Back:     Deferred  Lungs:     Clear to auscultation bilaterally, normal work of breathing, dry cough  Chest Wall:    Well healed midline incision   Heart:    Regular rate and rhythm, systolic murmur   Breast Exam:    Deferred  Abdomen:     Soft, non-tender, bowel sounds active all four quadrants  Genitalia:    Deferred  Rectal:    Deferred  Extremities:   Right hallux amputated, right lateral foot necrotic lesion approx 3cm x 5cm with adjacent area of duskiness that has extended beyond initial demarcation, no pulse palpable. Left foot all toes amputated, warm and well perfused.               Lab Results: Recent Results (from the past 2160 hour(s))  GLUCOSE, CAPILLARY     Status: Abnormal   Collection Time    12/26/13  9:02 AM      Result Value Ref Range   Glucose-Capillary 265 (*) 70 - 99 mg/dL  POCT I-STAT, CHEM 8     Status:  Abnormal   Collection Time    12/26/13  9:28 AM      Result Value Ref Range   Sodium 136 (*) 137 - 147 mEq/L   Potassium 4.1  3.7 - 5.3 mEq/L   Chloride 98  96 - 112 mEq/L   BUN 46 (*) 6 - 23 mg/dL   Creatinine, Ser 5.70 (*) 0.50 - 1.10 mg/dL   Glucose, Bld 272 (*) 70 - 99 mg/dL   Calcium, Ion 1.02 (*) 1.12 - 1.23 mmol/L   TCO2 25  0 - 100 mmol/L   Hemoglobin 16.0 (*) 12.0 - 15.0 g/dL   HCT 47.0 (*) 36.0 - 46.0 %  GLUCOSE, CAPILLARY     Status: Abnormal   Collection Time    12/26/13 12:37 PM      Result Value Ref Range   Glucose-Capillary 250 (*) 70 - 99 mg/dL  GLUCOSE, CAPILLARY     Status: Abnormal  Collection Time    12/26/13  3:59 PM      Result Value Ref Range   Glucose-Capillary 497 (*) 70 - 99 mg/dL  GLUCOSE, RANDOM     Status: Abnormal   Collection Time    12/26/13  4:00 PM      Result Value Ref Range   Glucose, Bld 538 (*) 70 - 99 mg/dL  GLUCOSE, CAPILLARY     Status: Abnormal   Collection Time    12/26/13  4:33 PM      Result Value Ref Range   Glucose-Capillary 523 (*) 70 - 99 mg/dL   Comment 1 Notify RN    GLUCOSE, CAPILLARY     Status: Abnormal   Collection Time    12/26/13  5:33 PM      Result Value Ref Range   Glucose-Capillary 370 (*) 70 - 99 mg/dL  GLUCOSE, CAPILLARY     Status: Abnormal   Collection Time    12/26/13  6:21 PM      Result Value Ref Range   Glucose-Capillary 327 (*) 70 - 99 mg/dL  MAGNESIUM     Status: None   Collection Time    12/26/13  7:20 PM      Result Value Ref Range   Magnesium 2.1  1.5 - 2.5 mg/dL  PHOSPHORUS     Status: Abnormal   Collection Time    12/26/13  7:20 PM      Result Value Ref Range   Phosphorus 7.5 (*) 2.3 - 4.6 mg/dL  TSH     Status: None   Collection Time    12/26/13  7:20 PM      Result Value Ref Range   TSH 2.070  0.350 - 4.500 uIU/mL  HEMOGLOBIN A1C     Status: Abnormal   Collection Time    12/26/13  7:20 PM      Result Value Ref Range   Hemoglobin A1C 9.1 (*) <5.7 %   Comment: (NOTE)                                                                                According to the ADA Clinical Practice Recommendations for 2011, when     HbA1c is used as a screening test:      >=6.5%   Diagnostic of Diabetes Mellitus               (if abnormal result is confirmed)     5.7-6.4%   Increased risk of developing Diabetes Mellitus     References:Diagnosis and Classification of Diabetes Mellitus,Diabetes     EZMO,2947,65(YYTKP 1):S62-S69 and Standards of Medical Care in             Diabetes - 2011,Diabetes Care,2011,34 (Suppl 1):S11-S61.   Mean Plasma Glucose 214 (*) <117 mg/dL   Comment: Performed at [facility]     Status: Abnormal   Collection Time    12/26/13  7:20 PM      Result Value Ref Range   Sodium 130 (*) 137 - 147 mEq/L   Potassium 4.3  3.7 - 5.3 mEq/L   Chloride 89 (*) 96 - 112 mEq/L  Comment: DELTA CHECK NOTED   CO2 22  19 - 32 mEq/L   Glucose, Bld 224 (*) 70 - 99 mg/dL   BUN 56 (*) 6 - 23 mg/dL   Creatinine, Ser 5.96 (*) 0.50 - 1.10 mg/dL   Calcium 8.3 (*) 8.4 - 10.5 mg/dL   Total Protein 7.3  6.0 - 8.3 g/dL   Albumin 3.3 (*) 3.5 - 5.2 g/dL   AST 318 (*) 0 - 37 U/L   ALT 207 (*) 0 - 35 U/L   Alkaline Phosphatase 180 (*) 39 - 117 U/L   Total Bilirubin 0.3  0.3 - 1.2 mg/dL   GFR calc non Af Amer 7 (*) >90 mL/min   GFR calc Af Amer 8 (*) >90 mL/min   Comment: (NOTE)     The eGFR has been calculated using the CKD EPI equation.     This calculation has not been validated in all clinical situations.     eGFR's persistently <90 mL/min signify possible Chronic Kidney     Disease.   Anion gap 19 (*) 5 - 15  GLUCOSE, CAPILLARY     Status: Abnormal   Collection Time    12/26/13  7:46 PM      Result Value Ref Range   Glucose-Capillary 200 (*) 70 - 99 mg/dL  GLUCOSE, CAPILLARY     Status: None   Collection Time    12/27/13 12:10 AM      Result Value Ref Range   Glucose-Capillary 97  70 - 99 mg/dL  GLUCOSE, CAPILLARY      Status: Abnormal   Collection Time    12/27/13  4:09 AM      Result Value Ref Range   Glucose-Capillary 257 (*) 70 - 99 mg/dL  GLUCOSE, CAPILLARY     Status: Abnormal   Collection Time    12/27/13  7:53 AM      Result Value Ref Range   Glucose-Capillary 107 (*) 70 - 99 mg/dL  CBC     Status: Abnormal   Collection Time    01/27/14  2:27 PM      Result Value Ref Range   WBC 7.8  4.0 - 10.5 K/uL   RBC 3.48 (*) 3.87 - 5.11 MIL/uL   Hemoglobin 10.7 (*) 12.0 - 15.0 g/dL   HCT 31.9 (*) 36.0 - 46.0 %   MCV 91.7  78.0 - 100.0 fL   MCH 30.7  26.0 - 34.0 pg   MCHC 33.5  30.0 - 36.0 g/dL   RDW 13.3  11.5 - 15.5 %   Platelets 139 (*) 150 - 400 K/uL  BASIC METABOLIC PANEL     Status: Abnormal   Collection Time    01/27/14  2:27 PM      Result Value Ref Range   Sodium 126 (*) 137 - 147 mEq/L   Potassium 4.3  3.7 - 5.3 mEq/L   Chloride 83 (*) 96 - 112 mEq/L   CO2 21  19 - 32 mEq/L   Glucose, Bld 338 (*) 70 - 99 mg/dL   BUN 44 (*) 6 - 23 mg/dL   Creatinine, Ser 4.88 (*) 0.50 - 1.10 mg/dL   Calcium 8.8  8.4 - 10.5 mg/dL   GFR calc non Af Amer 9 (*) >90 mL/min   GFR calc Af Amer 11 (*) >90 mL/min   Comment: (NOTE)     The eGFR has been calculated using the CKD EPI equation.     This calculation has not been  validated in all clinical situations.     eGFR's persistently <90 mL/min signify possible Chronic Kidney     Disease.   Anion gap 22 (*) 5 - 15  POC OCCULT BLOOD, ED     Status: None   Collection Time    01/27/14  3:58 PM      Result Value Ref Range   Fecal Occult Bld NEGATIVE  NEGATIVE  I-STAT CG4 LACTIC ACID, ED     Status: None   Collection Time    01/27/14  4:18 PM      Result Value Ref Range   Lactic Acid, Venous 1.38  0.5 - 2.2 mmol/L  CBG MONITORING, ED     Status: Abnormal   Collection Time    01/27/14  4:31 PM      Result Value Ref Range   Glucose-Capillary 277 (*) 70 - 99 mg/dL  CULTURE, BLOOD (ROUTINE X 2)     Status: None   Collection Time    01/27/14  5:05 PM        Result Value Ref Range   Specimen Description BLOOD LEFT FOREARM     Special Requests BOTTLES DRAWN AEROBIC ONLY 4CC     Culture  Setup Time       Value: 01/27/2014 22:34     Performed at [facility]   Culture       Value:        BLOOD CULTURE RECEIVED NO GROWTH TO DATE CULTURE WILL BE HELD FOR 5 DAYS BEFORE ISSUING A FINAL NEGATIVE REPORT     Performed at [facility]   Report Status PENDING    CULTURE, BLOOD (ROUTINE X 2)     Status: None   Collection Time    01/27/14  5:22 PM      Result Value Ref Range   Specimen Description BLOOD RIGHT HAND     Special Requests BOTTLES DRAWN AEROBIC AND ANAEROBIC 5CC EA     Culture  Setup Time       Value: 01/27/2014 22:33     Performed at [facility]   Culture       Value:        BLOOD CULTURE RECEIVED NO GROWTH TO DATE CULTURE WILL BE HELD FOR 5 DAYS BEFORE ISSUING A FINAL NEGATIVE REPORT     Performed at [facility]   Report Status PENDING    PROTIME-INR     Status: None   Collection Time    01/27/14  5:23 PM      Result Value Ref Range   Prothrombin Time 14.6  11.6 - 15.2 seconds   INR 1.13  0.00 - 1.49  GLUCOSE, CAPILLARY     Status: Abnormal   Collection Time    01/27/14  9:31 PM      Result Value Ref Range   Glucose-Capillary 233 (*) 70 - 99 mg/dL  KETONES, QUALITATIVE     Status: None   Collection Time    01/27/14 10:53 PM      Result Value Ref Range   Acetone, Bld NEGATIVE  NEGATIVE  SALICYLATE LEVEL     Status: Abnormal   Collection Time    01/27/14 10:53 PM      Result Value Ref Range   Salicylate Lvl <1.4 (*) 2.8 - 20.0 mg/dL  ACETAMINOPHEN LEVEL     Status: None   Collection Time    01/27/14 10:53 PM      Result Value Ref Range  Acetaminophen (Tylenol), Serum <15.0  10 - 30 ug/mL   Comment:            THERAPEUTIC CONCENTRATIONS VARY     SIGNIFICANTLY. A RANGE OF 10-30     ug/mL MAY BE AN EFFECTIVE     CONCENTRATION FOR MANY PATIENTS.     HOWEVER, SOME ARE BEST TREATED      AT CONCENTRATIONS OUTSIDE THIS     RANGE.     ACETAMINOPHEN CONCENTRATIONS     >150 ug/mL AT 4 HOURS AFTER     INGESTION AND >50 ug/mL AT 12     HOURS AFTER INGESTION ARE     OFTEN ASSOCIATED WITH TOXIC     REACTIONS.  BASIC METABOLIC PANEL     Status: Abnormal   Collection Time    01/27/14 10:53 PM      Result Value Ref Range   Sodium 129 (*) 137 - 147 mEq/L   Potassium 4.2  3.7 - 5.3 mEq/L   Chloride 86 (*) 96 - 112 mEq/L   CO2 23  19 - 32 mEq/L   Glucose, Bld 306 (*) 70 - 99 mg/dL   BUN 47 (*) 6 - 23 mg/dL   Creatinine, Ser 5.46 (*) 0.50 - 1.10 mg/dL   Calcium 8.1 (*) 8.4 - 10.5 mg/dL   GFR calc non Af Amer 8 (*) >90 mL/min   GFR calc Af Amer 9 (*) >90 mL/min   Comment: (NOTE)     The eGFR has been calculated using the CKD EPI equation.     This calculation has not been validated in all clinical situations.     eGFR's persistently <90 mL/min signify possible Chronic Kidney     Disease.   Anion gap 20 (*) 5 - 15  PHOSPHORUS     Status: Abnormal   Collection Time    01/27/14 10:53 PM      Result Value Ref Range   Phosphorus 6.8 (*) 2.3 - 4.6 mg/dL  GLUCOSE, CAPILLARY     Status: Abnormal   Collection Time    01/27/14 11:59 PM      Result Value Ref Range   Glucose-Capillary 257 (*) 70 - 99 mg/dL  GLUCOSE, CAPILLARY     Status: Abnormal   Collection Time    01/28/14  4:05 AM      Result Value Ref Range   Glucose-Capillary 118 (*) 70 - 99 mg/dL  MRSA PCR SCREENING     Status: None   Collection Time    01/28/14  4:24 AM      Result Value Ref Range   MRSA by PCR NEGATIVE  NEGATIVE   Comment:            The GeneXpert MRSA Assay (FDA     approved for NASAL specimens     only), is one component of a     comprehensive MRSA colonization     surveillance program. It is not     intended to diagnose MRSA     infection nor to guide or     monitor treatment for     MRSA infections.  CBC     Status: Abnormal   Collection Time    01/28/14  5:33 AM      Result Value Ref  Range   WBC 6.0  4.0 - 10.5 K/uL   RBC 3.16 (*) 3.87 - 5.11 MIL/uL   Hemoglobin 9.6 (*) 12.0 - 15.0 g/dL   HCT 29.0 (*) 36.0 - 46.0 %  MCV 91.8  78.0 - 100.0 fL   MCH 30.4  26.0 - 34.0 pg   MCHC 33.1  30.0 - 36.0 g/dL   RDW 13.3  11.5 - 15.5 %   Platelets 126 (*) 150 - 400 K/uL  BASIC METABOLIC PANEL     Status: Abnormal   Collection Time    01/28/14  5:33 AM      Result Value Ref Range   Sodium 135 (*) 137 - 147 mEq/L   Potassium 4.6  3.7 - 5.3 mEq/L   Chloride 89 (*) 96 - 112 mEq/L   CO2 24  19 - 32 mEq/L   Glucose, Bld 133 (*) 70 - 99 mg/dL   BUN 53 (*) 6 - 23 mg/dL   Creatinine, Ser 6.23 (*) 0.50 - 1.10 mg/dL   Calcium 8.3 (*) 8.4 - 10.5 mg/dL   GFR calc non Af Amer 7 (*) >90 mL/min   GFR calc Af Amer 8 (*) >90 mL/min   Comment: (NOTE)     The eGFR has been calculated using the CKD EPI equation.     This calculation has not been validated in all clinical situations.     eGFR's persistently <90 mL/min signify possible Chronic Kidney     Disease.   Anion gap 22 (*) 5 - 15  GLUCOSE, CAPILLARY     Status: Abnormal   Collection Time    01/28/14  7:42 AM      Result Value Ref Range   Glucose-Capillary 154 (*) 70 - 99 mg/dL  GLUCOSE, CAPILLARY     Status: Abnormal   Collection Time    01/28/14 11:27 AM      Result Value Ref Range   Glucose-Capillary 136 (*) 70 - 99 mg/dL  GLUCOSE, CAPILLARY     Status: Abnormal   Collection Time    01/28/14  5:33 PM      Result Value Ref Range   Glucose-Capillary 205 (*) 70 - 99 mg/dL  GLUCOSE, CAPILLARY     Status: Abnormal   Collection Time    01/28/14  8:38 PM      Result Value Ref Range   Glucose-Capillary 173 (*) 70 - 99 mg/dL  CBC     Status: Abnormal   Collection Time    01/29/14  7:20 AM      Result Value Ref Range   WBC 8.9  4.0 - 10.5 K/uL   RBC 3.16 (*) 3.87 - 5.11 MIL/uL   Hemoglobin 9.5 (*) 12.0 - 15.0 g/dL   HCT 29.5 (*) 36.0 - 46.0 %   MCV 93.4  78.0 - 100.0 fL   MCH 30.1  26.0 - 34.0 pg   MCHC 32.2  30.0 -  36.0 g/dL   RDW 13.5  11.5 - 15.5 %   Platelets 150  150 - 400 K/uL  RENAL FUNCTION PANEL     Status: Abnormal   Collection Time    01/29/14  7:20 AM      Result Value Ref Range   Sodium 134 (*) 137 - 147 mEq/L   Potassium 5.0  3.7 - 5.3 mEq/L   Chloride 87 (*) 96 - 112 mEq/L   CO2 20  19 - 32 mEq/L   Glucose, Bld 227 (*) 70 - 99 mg/dL   BUN 66 (*) 6 - 23 mg/dL   Creatinine, Ser 8.07 (*) 0.50 - 1.10 mg/dL   Calcium 8.0 (*) 8.4 - 10.5 mg/dL   Phosphorus 8.7 (*) 2.3 - 4.6 mg/dL  Albumin 2.9 (*) 3.5 - 5.2 g/dL   GFR calc non Af Amer 5 (*) >90 mL/min   GFR calc Af Amer 6 (*) >90 mL/min   Comment: (NOTE)     The eGFR has been calculated using the CKD EPI equation.     This calculation has not been validated in all clinical situations.     eGFR's persistently <90 mL/min signify possible Chronic Kidney     Disease.   Anion gap 27 (*) 5 - 15  HEPATITIS B SURFACE ANTIGEN     Status: None   Collection Time    01/29/14  7:20 AM      Result Value Ref Range   Hepatitis B Surface Ag NEGATIVE  NEGATIVE   Comment: Performed at [facility]     Status: Abnormal   Collection Time    01/29/14  7:20 AM      Result Value Ref Range   Ferritin 429 (*) 10 - 291 ng/mL   Comment: Performed at [facility]     Status: Abnormal   Collection Time    01/29/14  7:20 AM      Result Value Ref Range   Iron 54  42 - 135 ug/dL   TIBC 210 (*) 250 - 470 ug/dL   Saturation Ratios 26  20 - 55 %   UIBC 156  125 - 400 ug/dL   Comment: Performed at [place], CAPILLARY     Status: Abnormal   Collection Time    01/29/14 11:57 AM      Result Value Ref Range   Glucose-Capillary 108 (*) 70 - 99 mg/dL  GLUCOSE, CAPILLARY     Status: Abnormal   Collection Time    01/29/14  4:45 PM      Result Value Ref Range   Glucose-Capillary 192 (*) 70 - 99 mg/dL  GLUCOSE, CAPILLARY     Status: Abnormal   Collection Time    01/29/14  9:04 PM      Result Value  Ref Range   Glucose-Capillary 229 (*) 70 - 99 mg/dL  CBC     Status: Abnormal   Collection Time    01/30/14  5:30 AM      Result Value Ref Range   WBC 7.9  4.0 - 10.5 K/uL   RBC 3.15 (*) 3.87 - 5.11 MIL/uL   Hemoglobin 9.9 (*) 12.0 - 15.0 g/dL   HCT 30.1 (*) 36.0 - 46.0 %   MCV 95.6  78.0 - 100.0 fL   MCH 31.4  26.0 - 34.0 pg   MCHC 32.9  30.0 - 36.0 g/dL   RDW 13.7  11.5 - 15.5 %   Platelets 167  150 - 400 K/uL  RENAL FUNCTION PANEL     Status: Abnormal   Collection Time    01/30/14  5:30 AM      Result Value Ref Range   Sodium 140  137 - 147 mEq/L   Potassium 4.9  3.7 - 5.3 mEq/L   Chloride 94 (*) 96 - 112 mEq/L   Comment: DELTA CHECK NOTED     NO VISIBLE HEMOLYSIS   CO2 22  19 - 32 mEq/L   Glucose, Bld 244 (*) 70 - 99 mg/dL   BUN 40 (*) 6 - 23 mg/dL   Comment: DELTA CHECK NOTED   Creatinine, Ser 5.67 (*) 0.50 - 1.10 mg/dL   Calcium 9.1  8.4 - 10.5 mg/dL  Phosphorus 9.8 (*) 2.3 - 4.6 mg/dL   Albumin 2.9 (*) 3.5 - 5.2 g/dL   GFR calc non Af Amer 8 (*) >90 mL/min   GFR calc Af Amer 9 (*) >90 mL/min   Comment: (NOTE)     The eGFR has been calculated using the CKD EPI equation.     This calculation has not been validated in all clinical situations.     eGFR's persistently <90 mL/min signify possible Chronic Kidney     Disease.   Anion gap 24 (*) 5 - 15  GLUCOSE, CAPILLARY     Status: Abnormal   Collection Time    01/30/14  7:30 AM      Result Value Ref Range   Glucose-Capillary 230 (*) 70 - 99 mg/dL  GLUCOSE, CAPILLARY     Status: Abnormal   Collection Time    01/30/14  1:07 PM      Result Value Ref Range   Glucose-Capillary 249 (*) 70 - 99 mg/dL  GLUCOSE, CAPILLARY     Status: Abnormal   Collection Time    01/30/14  5:11 PM      Result Value Ref Range   Glucose-Capillary 216 (*) 70 - 99 mg/dL  GLUCOSE, CAPILLARY     Status: Abnormal   Collection Time    01/30/14  8:47 PM      Result Value Ref Range   Glucose-Capillary 252 (*) 70 - 99 mg/dL  RENAL FUNCTION  PANEL     Status: Abnormal   Collection Time    01/31/14  6:47 AM      Result Value Ref Range   Sodium 135 (*) 137 - 147 mEq/L   Potassium 5.0  3.7 - 5.3 mEq/L   Chloride 89 (*) 96 - 112 mEq/L   CO2 20  19 - 32 mEq/L   Glucose, Bld 208 (*) 70 - 99 mg/dL   BUN 58 (*) 6 - 23 mg/dL   Creatinine, Ser 7.93 (*) 0.50 - 1.10 mg/dL   Calcium 9.2  8.4 - 10.5 mg/dL   Phosphorus 10.1 (*) 2.3 - 4.6 mg/dL   Albumin 2.7 (*) 3.5 - 5.2 g/dL   GFR calc non Af Amer 5 (*) >90 mL/min   GFR calc Af Amer 6 (*) >90 mL/min   Comment: (NOTE)     The eGFR has been calculated using the CKD EPI equation.     This calculation has not been validated in all clinical situations.     eGFR's persistently <90 mL/min signify possible Chronic Kidney     Disease.   Anion gap 26 (*) 5 - 15  CBC     Status: Abnormal   Collection Time    01/31/14  6:47 AM      Result Value Ref Range   WBC 9.5  4.0 - 10.5 K/uL   RBC 3.16 (*) 3.87 - 5.11 MIL/uL   Hemoglobin 9.8 (*) 12.0 - 15.0 g/dL   HCT 29.8 (*) 36.0 - 46.0 %   MCV 94.3  78.0 - 100.0 fL   MCH 31.0  26.0 - 34.0 pg   MCHC 32.9  30.0 - 36.0 g/dL   RDW 13.8  11.5 - 15.5 %   Platelets 204  150 - 400 K/uL  GLUCOSE, CAPILLARY     Status: Abnormal   Collection Time    01/31/14  7:52 AM      Result Value Ref Range   Glucose-Capillary 226 (*) 70 - 99 mg/dL  GLUCOSE, CAPILLARY  Status: Abnormal   Collection Time    01/31/14 11:41 AM      Result Value Ref Range   Glucose-Capillary 308 (*) 70 - 99 mg/dL   Micro Results: Recent Results (from the past 240 hour(s))  CULTURE, BLOOD (ROUTINE X 2)     Status: None   Collection Time    01/27/14  5:05 PM      Result Value Ref Range Status   Specimen Description BLOOD LEFT FOREARM   Final   Special Requests BOTTLES DRAWN AEROBIC ONLY 4CC   Final   Culture  Setup Time     Final   Value: 01/27/2014 22:34     Performed at [facility]   Culture     Final   Value:        BLOOD CULTURE RECEIVED NO GROWTH TO DATE  CULTURE WILL BE HELD FOR 5 DAYS BEFORE ISSUING A FINAL NEGATIVE REPORT     Performed at [facility]   Report Status PENDING   Incomplete  CULTURE, BLOOD (ROUTINE X 2)     Status: None   Collection Time    01/27/14  5:22 PM      Result Value Ref Range Status   Specimen Description BLOOD RIGHT HAND   Final   Special Requests BOTTLES DRAWN AEROBIC AND ANAEROBIC 5CC EA   Final   Culture  Setup Time     Final   Value: 01/27/2014 22:33     Performed at [facility]   Culture     Final   Value:        BLOOD CULTURE RECEIVED NO GROWTH TO DATE CULTURE WILL BE HELD FOR 5 DAYS BEFORE ISSUING A FINAL NEGATIVE REPORT     Performed at [facility]   Report Status PENDING   Incomplete  MRSA PCR SCREENING     Status: None   Collection Time    01/28/14  4:24 AM      Result Value Ref Range Status   MRSA by PCR NEGATIVE  NEGATIVE Final   Comment:            The GeneXpert MRSA Assay (FDA     approved for NASAL specimens     only), is one component of a     comprehensive MRSA colonization     surveillance program. It is not     intended to diagnose MRSA     infection nor to guide or     monitor treatment for     MRSA infections.   Studies/Results: No results found. Medications: I have reviewed the patient's current medications. 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 * Vascular consult: unable to revascularize, surgery for AKA v BKA tomorrow. * Cont  vanc and zosyn * Ankle brachial index: patient refused 2/2 pain * F/U blood cultures - no growth to date, results final tomorrow * Convert IV dilaudid to PO oxycontin 18m q12h and oxycodone [id number] prn  Diabetes mellitus type II: * Blood glucose consistently above 200, increased levemir yesterday to 9 units from 3 units * Continue sensitive  sliding scale insulin aspart * Will re eval after surgery * NPO at midnight  ESRD:  * HD today, nephro following  Anemia: * Stable, continue to follow CBCs  CAD: * Hold home meds  HTN: * Normotensive, occasional elevated systolics * Continue to hold home meds  Hypothyroidism: * Continue home meds  This is a Careers information officer Note.  The care of the patient was discussed with Dr. Daryll Drown and the assessment and plan formulated with their assistance.  Please see their attached note for official documentation of the daily encounter.   LOS: 4 days   Jerene Dilling, Med Student 01/31/2014, 3:13 PM

## 2014-02-01 ENCOUNTER — Encounter (HOSPITAL_COMMUNITY): Payer: Medicaid Other | Admitting: Anesthesiology

## 2014-02-01 ENCOUNTER — Encounter (HOSPITAL_COMMUNITY): Payer: Self-pay | Admitting: Anesthesiology

## 2014-02-01 ENCOUNTER — Inpatient Hospital Stay (HOSPITAL_COMMUNITY): Payer: Medicaid Other | Admitting: Anesthesiology

## 2014-02-01 ENCOUNTER — Encounter (HOSPITAL_COMMUNITY)
Admission: EM | Disposition: A | Discharge status: Skilled Nursing Facility | Payer: Self-pay | Source: Home / Self Care | Attending: Internal Medicine

## 2014-02-01 HISTORY — PX: AMPUTATION: SHX166

## 2014-02-01 LAB — GLUCOSE, CAPILLARY
Glucose-Capillary: 182 mg/dL — ABNORMAL HIGH (ref 70–99)
Glucose-Capillary: 261 mg/dL — ABNORMAL HIGH (ref 70–99)
Glucose-Capillary: 204 mg/dL — ABNORMAL HIGH (ref 70–99)
Glucose-Capillary: 234 mg/dL — ABNORMAL HIGH (ref 70–99)
Glucose-Capillary: 246 mg/dL — ABNORMAL HIGH (ref 70–99)

## 2014-02-01 LAB — BASIC METABOLIC PANEL
Anion gap: 22 — ABNORMAL HIGH (ref 5–15)
BUN: 28 mg/dL — ABNORMAL HIGH (ref 6–23)
CO2: 22 mEq/L (ref 19–32)
Calcium: 9.4 mg/dL (ref 8.4–10.5)
Chloride: 88 mEq/L — ABNORMAL LOW (ref 96–112)
Creatinine, Ser: 5.03 mg/dL — ABNORMAL HIGH (ref 0.50–1.10)
GFR calc Af Amer: 10 mL/min — ABNORMAL LOW (ref >90)
GFR calc non Af Amer: 9 mL/min — ABNORMAL LOW (ref >90)
Glucose, Bld: 149 mg/dL — ABNORMAL HIGH (ref 70–99)
Potassium: 4.1 mEq/L (ref 3.7–5.3)
Sodium: 132 mEq/L — ABNORMAL LOW (ref 137–147)

## 2014-02-01 LAB — SURGICAL PCR SCREEN
MRSA, PCR: NEGATIVE
STAPHYLOCOCCUS AUREUS: NEGATIVE

## 2014-02-01 SURGERY — AMPUTATION BELOW KNEE
Anesthesia: Regional | Site: Knee | Laterality: Right

## 2014-02-01 MED ORDER — 0.9 % SODIUM CHLORIDE (POUR BTL) OPTIME
TOPICAL | Status: DC | PRN
  Administered 2014-02-01: 1000 mL

## 2014-02-01 MED ORDER — ONDANSETRON HCL 4 MG/2ML IJ SOLN
INTRAMUSCULAR | Status: DC | PRN
  Administered 2014-02-01: 4 mg via INTRAVENOUS

## 2014-02-01 MED ORDER — PROMETHAZINE HCL 25 MG PO TABS
12.5000 mg | ORAL_TABLET | Freq: Four times a day (QID) | ORAL | Status: DC | PRN
  Administered 2014-02-01: 12.5 mg via ORAL
  Filled 2014-02-01: qty 1

## 2014-02-01 MED ORDER — ROPIVACAINE HCL 5 MG/ML IJ SOLN
INTRAMUSCULAR | Status: DC | PRN
  Administered 2014-02-01: 30 mL via PERINEURAL
  Administered 2014-02-01: 20 mL via PERINEURAL

## 2014-02-01 MED ORDER — PENTAFLUOROPROP-TETRAFLUOROETH EX AERO: 1.0000 "application " | INHALATION_SPRAY | CUTANEOUS | Status: DC | PRN

## 2014-02-01 MED ORDER — HEPARIN SODIUM (PORCINE) 1000 UNIT/ML DIALYSIS
1000.0000 [IU] | INTRAMUSCULAR | Status: DC | PRN
Start: 2014-02-01 — End: 2014-02-06
  Filled 2014-02-01: qty 1

## 2014-02-01 MED ORDER — ESMOLOL HCL 10 MG/ML IV SOLN
INTRAVENOUS | Status: DC | PRN
  Administered 2014-02-01: 20 mg via INTRAVENOUS

## 2014-02-01 MED ORDER — MIDAZOLAM HCL 2 MG/2ML IJ SOLN
INTRAMUSCULAR | Status: AC
  Filled 2014-02-01: qty 2

## 2014-02-01 MED ORDER — ESMOLOL HCL 10 MG/ML IV SOLN
INTRAVENOUS | Status: AC
  Filled 2014-02-01: qty 10

## 2014-02-01 MED ORDER — PROMETHAZINE HCL 25 MG/ML IJ SOLN
12.5000 mg | Freq: Four times a day (QID) | INTRAMUSCULAR | Status: DC | PRN
  Administered 2014-02-01 – 2014-02-04 (×4): 12.5 mg via INTRAVENOUS
  Filled 2014-02-01 (×3): qty 1

## 2014-02-01 MED ORDER — LIDOCAINE HCL (PF) 1 % IJ SOLN
5.0000 mL | INTRAMUSCULAR | Status: DC | PRN
Start: 2014-02-01 — End: 2014-02-06

## 2014-02-01 MED ORDER — PROMETHAZINE HCL 25 MG/ML IJ SOLN: 12.5000 mg | Freq: Once | INTRAMUSCULAR | Status: DC | PRN

## 2014-02-01 MED ORDER — ALTEPLASE 2 MG IJ SOLR
2.0000 mg | Freq: Once | INTRAMUSCULAR | Status: AC | PRN
Start: 2014-02-01 — End: 2014-02-01
  Filled 2014-02-01: qty 2

## 2014-02-01 MED ORDER — SODIUM CHLORIDE 0.9 % IV SOLN
100.0000 mL | INTRAVENOUS | Status: DC | PRN
Start: 2014-02-01 — End: 2014-02-06

## 2014-02-01 MED ORDER — HYDRALAZINE HCL 20 MG/ML IJ SOLN: 10.0000 mg | Freq: Four times a day (QID) | INTRAMUSCULAR | Status: DC | PRN

## 2014-02-01 MED ORDER — FENTANYL CITRATE 0.05 MG/ML IJ SOLN
INTRAMUSCULAR | Status: AC
  Filled 2014-02-01: qty 5

## 2014-02-01 MED ORDER — LIDOCAINE-PRILOCAINE 2.5-2.5 % EX CREA: 1.0000 "application " | TOPICAL_CREAM | CUTANEOUS | Status: DC | PRN

## 2014-02-01 MED ORDER — SODIUM CHLORIDE 0.9 % IV SOLN
INTRAVENOUS | Status: DC | PRN
  Administered 2014-02-01: 13:00:00 via INTRAVENOUS

## 2014-02-01 MED ORDER — CEFAZOLIN SODIUM-DEXTROSE 2-3 GM-% IV SOLR
INTRAVENOUS | Status: AC
  Filled 2014-02-01: qty 50

## 2014-02-01 MED ORDER — LABETALOL HCL 5 MG/ML IV SOLN
INTRAVENOUS | Status: DC | PRN
  Administered 2014-02-01 (×2): 2.5 mg via INTRAVENOUS

## 2014-02-01 MED ORDER — SODIUM CHLORIDE 0.9 % IV SOLN: 12.5000 mg | Freq: Once | INTRAVENOUS | Status: DC

## 2014-02-01 MED ORDER — NEPRO/CARBSTEADY PO LIQD: 237.0000 mL | ORAL | Status: DC | PRN

## 2014-02-01 SURGICAL SUPPLY — 46 items
BANDAGE ELASTIC 4 VELCRO ST LF (GAUZE/BANDAGES/DRESSINGS) ×2 IMPLANT
BANDAGE ELASTIC 6 VELCRO ST LF (GAUZE/BANDAGES/DRESSINGS) ×2 IMPLANT
BANDAGE ESMARK 6X9 LF (GAUZE/BANDAGES/DRESSINGS) ×1 IMPLANT
BLADE SAW RECIP 87.9 MT (BLADE) ×3 IMPLANT
BLADE SURG 21 STRL SS (BLADE) ×3 IMPLANT
BNDG CMPR 9X6 STRL LF SNTH (GAUZE/BANDAGES/DRESSINGS) ×1
BNDG COHESIVE 6X5 TAN STRL LF (GAUZE/BANDAGES/DRESSINGS) ×6 IMPLANT
BNDG ESMARK 6X9 LF (GAUZE/BANDAGES/DRESSINGS) ×3
BNDG GAUZE ELAST 4 BULKY (GAUZE/BANDAGES/DRESSINGS) ×4 IMPLANT
COVER SURGICAL LIGHT HANDLE (MISCELLANEOUS) ×3 IMPLANT
CUFF TOURNIQUET SINGLE 34IN LL (TOURNIQUET CUFF) ×2 IMPLANT
CUFF TOURNIQUET SINGLE 44IN (TOURNIQUET CUFF) IMPLANT
DRAIN PENROSE 1/2X12 LTX STRL (WOUND CARE) IMPLANT
DRAPE EXTREMITY T 121X128X90 (DRAPE) ×3 IMPLANT
DRAPE PROXIMA HALF (DRAPES) ×6 IMPLANT
DRAPE U-SHAPE 47X51 STRL (DRAPES) ×6 IMPLANT
DRSG ADAPTIC 3X8 NADH LF (GAUZE/BANDAGES/DRESSINGS) ×3 IMPLANT
DRSG PAD ABDOMINAL 8X10 ST (GAUZE/BANDAGES/DRESSINGS) ×3 IMPLANT
DURAPREP 26ML APPLICATOR (WOUND CARE) ×3 IMPLANT
ELECT REM PT RETURN 9FT ADLT (ELECTROSURGICAL) ×3
ELECTRODE REM PT RTRN 9FT ADLT (ELECTROSURGICAL) ×1 IMPLANT
GAUZE SPONGE 4X4 12PLY STRL (GAUZE/BANDAGES/DRESSINGS) ×3 IMPLANT
GAUZE VASELINE 3X9 (GAUZE/BANDAGES/DRESSINGS) ×4 IMPLANT
GLOVE BIOGEL PI IND STRL 9 (GLOVE) ×1 IMPLANT
GLOVE BIOGEL PI INDICATOR 9 (GLOVE) ×2
GLOVE SURG ORTHO 9.0 STRL STRW (GLOVE) ×3 IMPLANT
GOWN STRL REUS W/ TWL XL LVL3 (GOWN DISPOSABLE) ×2 IMPLANT
GOWN STRL REUS W/TWL XL LVL3 (GOWN DISPOSABLE) ×6
KIT BASIN OR (CUSTOM PROCEDURE TRAY) ×3 IMPLANT
KIT ROOM TURNOVER OR (KITS) ×3 IMPLANT
MANIFOLD NEPTUNE II (INSTRUMENTS) ×3 IMPLANT
NS IRRIG 1000ML POUR BTL (IV SOLUTION) ×3 IMPLANT
PACK GENERAL/GYN (CUSTOM PROCEDURE TRAY) ×3 IMPLANT
PAD ARMBOARD 7.5X6 YLW CONV (MISCELLANEOUS) ×6 IMPLANT
SPONGE GAUZE 4X4 12PLY STER LF (GAUZE/BANDAGES/DRESSINGS) ×2 IMPLANT
SPONGE LAP 18X18 X RAY DECT (DISPOSABLE) IMPLANT
STAPLER VISISTAT 35W (STAPLE) ×2 IMPLANT
STOCKINETTE IMPERVIOUS LG (DRAPES) ×3 IMPLANT
SUT PDS AB 1 CT  36 (SUTURE)
SUT PDS AB 1 CT 36 (SUTURE) IMPLANT
SUT SILK 2 0 (SUTURE) ×3
SUT SILK 2-0 18XBRD TIE 12 (SUTURE) ×1 IMPLANT
TOWEL OR 17X24 6PK STRL BLUE (TOWEL DISPOSABLE) ×3 IMPLANT
TOWEL OR 17X26 10 PK STRL BLUE (TOWEL DISPOSABLE) ×3 IMPLANT
TUBE ANAEROBIC SPECIMEN COL (MISCELLANEOUS) IMPLANT
WATER STERILE IRR 1000ML POUR (IV SOLUTION) ×3 IMPLANT

## 2014-02-01 NOTE — Progress Notes (Signed)
  I have seen and examined the patient myself, and I have reviewed the note by Jerene Dilling, MS 3 and was present during the interview and physical exam.  Please see my separate H&P for additional findings, assessment, and plan.   Signed: Charlott Rakes, MD 02/01/2014, 5:36 PM

## 2014-02-01 NOTE — Progress Notes (Signed)
Pt co pain nausea bp 184/81 paged Dr. Daryll Drown,  Pt going to surgery.  Go ahead give oxy 5 mg and phenergan 12.5, will round on pt this am.

## 2014-02-01 NOTE — Progress Notes (Signed)
Subjective: This AM, she was having dry heaves and noted not being able to hold her food down. She will be going to the OR today.   Objective: Vital signs in last 24 hours: Filed Vitals:   01/31/14 2053 01/31/14 2122 02/01/14 0500 02/01/14 0946  BP: 185/95 148/82 139/111 184/81  Pulse: 86  87 80  Temp: 98.4 F (36.9 C)  98.4 F (36.9 C) 97.8 F (36.6 C)  TempSrc: Axillary  Oral Oral  Resp: 20  18 22   Height:      Weight:  120 lb 13 oz (54.8 kg)    SpO2: 100%  97% 96%   Weight change:   Intake/Output Summary (Last 24 hours) at 02/01/14 1314 Last data filed at 02/01/14 0900  Gross per 24 hour  Intake    120 ml  Output   1215 ml  Net  -1095 ml   General: resting in bed, NAD HEENT: PERRL, EOMI, no scleral icterus Cardiac: RRR Pulm: clear to auscultation bilaterally though difficult due to her dry heaves Ext: L foot without toes covered in sock with several calluses present on lateral aspect, R foot with erythema surrounding wound of fifth metatarsal consistent with focal necrosis and surrounding dusky tissue; erythema has advanced past the line demarcated from yesterday Neuro: responds to questions appropriately; moving all extremities freely   Lab Results: Basic Metabolic Panel:  Recent Labs Lab 01/30/14 0530 01/31/14 0647 02/01/14 0438  NA 140 135* 132*  K 4.9 5.0 4.1  CL 94* 89* 88*  CO2 22 20 22   GLUCOSE 244* 208* 149*  BUN 40* 58* 28*  CREATININE 5.67* 7.93* 5.03*  CALCIUM 9.1 9.2 9.4  PHOS 9.8* 10.1*  --    CBG:  Recent Labs Lab 01/31/14 0752 01/31/14 1141 01/31/14 1715 01/31/14 2052 02/01/14 0746 02/01/14 1141  GLUCAP 226* 308* 77 138* 182* 261*    Micro Results: Recent Results (from the past 240 hour(s))  CULTURE, BLOOD (ROUTINE X 2)     Status: None   Collection Time    01/27/14  5:05 PM      Result Value Ref Range Status   Specimen Description BLOOD LEFT FOREARM   Final   Special Requests BOTTLES DRAWN AEROBIC ONLY 4CC   Final   Culture  Setup Time     Final   Value: 01/27/2014 22:34     Performed at [facility]   Culture     Final   Value:        BLOOD CULTURE RECEIVED NO GROWTH TO DATE CULTURE WILL BE HELD FOR 5 DAYS BEFORE ISSUING A FINAL NEGATIVE REPORT     Performed at [facility]   Report Status PENDING   Incomplete  CULTURE, BLOOD (ROUTINE X 2)     Status: None   Collection Time    01/27/14  5:22 PM      Result Value Ref Range Status   Specimen Description BLOOD RIGHT HAND   Final   Special Requests BOTTLES DRAWN AEROBIC AND ANAEROBIC 5CC EA   Final   Culture  Setup Time     Final   Value: 01/27/2014 22:33     Performed at [facility]   Culture     Final   Value:        BLOOD CULTURE RECEIVED NO GROWTH TO DATE CULTURE WILL BE HELD FOR 5 DAYS BEFORE ISSUING A FINAL NEGATIVE REPORT     Performed at [facility]   Report  Status PENDING   Incomplete  MRSA PCR SCREENING     Status: None   Collection Time    01/28/14  4:24 AM      Result Value Ref Range Status   MRSA by PCR NEGATIVE  NEGATIVE Final   Comment:            The GeneXpert MRSA Assay (FDA     approved for NASAL specimens     only), is one component of a     comprehensive MRSA colonization     surveillance program. It is not     intended to diagnose MRSA     infection nor to guide or     monitor treatment for     MRSA infections.  SURGICAL PCR SCREEN     Status: None   Collection Time    01/31/14  9:52 PM      Result Value Ref Range Status   MRSA, PCR NEGATIVE  NEGATIVE Final   Staphylococcus aureus NEGATIVE  NEGATIVE Final   Comment:            The Xpert SA Assay (FDA     approved for NASAL specimens     in patients over 26 years of age),     is one component of     a comprehensive surveillance     program.  Test performance has     been validated by [employer] for patients greater     than or equal to 33 year old.     It is not intended     to diagnose infection nor to     guide or  monitor treatment.   Studies/Results: No results found. Medications: I have reviewed the patient's current medications. Scheduled Meds: .  ceFAZolin (ANCEF) IV  2 g Intravenous On Call to OR  . chlorhexidine  60 mL Topical Once  . darbepoetin (ARANESP) injection - DIALYSIS  40 mcg Intravenous Q Thu-HD  . doxercalciferol  1 mcg Intravenous Q T,Th,Sa-HD  . ferric gluconate (FERRLECIT/NULECIT) IV  125 mg Intravenous Q Thu-HD  . insulin aspart  0-9 Units Subcutaneous TID WC  . insulin detemir  9 Units Subcutaneous QHS  . levothyroxine  25 mcg Oral QAC breakfast  . multivitamin  1 tablet Oral QHS  . OxyCODONE  20 mg Oral Q12H  . piperacillin-tazobactam (ZOSYN)  IV  2.25 g Intravenous 3 times per day  . senna-docusate  1 tablet Oral BID  . sevelamer carbonate  1,600 mg Oral TID WC  . sodium chloride  3 mL Intravenous Q12H  . vancomycin  500 mg Intravenous Q T,Th,Sa-HD   Continuous Infusions:  PRN Meds:.ondansetron (ZOFRAN) IV, ondansetron, oxyCODONE, promethazine, promethazine Assessment/Plan: Principal Problem:   Ischemic ulcer of right foot, limited to breakdown of skin Active Problems:   End stage renal disease   Essential hypertension   DM type 2, uncontrolled, with renal complications   Hypothyroidism  Brandi Arellano is a 58 year old female with DM2, ESRD, HTN, hypothyroidism who is hospitalized for ischemic ulcer of R foot with cellulitis and osteomyelitis likely 2/2 possible Buerger's disease found to have stenosing flexor synovitis of L 3rd digit..   Ischemic ulcer of R foot with cellulitis and osteomyelitis 2/2 possible Buerger's disease: Scheduled for transtibial amputation today. Nausea is refractory to Zofran. -Continue oxy SR 20mg  BID & oxy IR 5 mg q6h prn for breakthrough; suspect pain will resolve with amputation -Blood cultures NGTD x 5 days -Continue  vancomycin & Zosyn (Abx Day 5) -Treat nausea with Phenergan 12.5mg  q6h IV -Ortho following, appreciate  recs  Hyponatremia: Na 132, down from 135. Serum osm 282. Likely 2/2 hyperglycemia as glucose is persistently in 200s. Continue following.  Anion gap metabolic acidosis: AG 26, up from 24. Likely 2/2 renal disease and infection. Continue following.  ESRD on HD: Receiving dialysis today. Renal following.  Anemia of chronic disease: Did not draw CBC today.    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   CAD: Holding home meds.  Diastolic CHF: Holding home meds.  HTN: BP trending normotensive. Holding home meds.   DM2: CBGs trending upper 100s-200s but will be NPO tomorrow. Will defer changes until later. -Continue Levermir 9u QHS -Continue SSI-S.  Hypothyroidism: Continue home meds.  FEN:  -Diet: Renal/Carb Modified  DVT prophylaxis: heparin 5000 units subcutaneous  CODE STATUS: FULL CODE  Dispo: Disposition is deferred at this time, awaiting improvement of current medical problems.  Anticipated discharge in approximately 1-2 day(s).   The patient does not have a current PCP (No primary provider on file.) and does not need an [facility] hospital follow-up appointment after discharge.  The patient does have transportation limitations that hinder transportation to clinic appointments.  .Services Needed at time of discharge: Y = Yes, Blank = No PT:   OT:   RN:   Equipment:   Other:     LOS: 5 days   Charlott Rakes, MD 02/01/2014, 1:14 PM

## 2014-02-01 NOTE — Progress Notes (Signed)
  Date: 02/01/2014  Patient name: Brandi Arellano  Medical record number: [id number]  Date of birth: Jun 10, 1955   This patient has been seen and the plan of care was discussed with the house staff. Please see their note for complete details. I concur with their findings with the following additions/corrections:  Patient underwent BKA today.   Sid Falcon, MD 02/01/2014, 9:59 PM

## 2014-02-01 NOTE — Progress Notes (Signed)
Paged Dr.

## 2014-02-01 NOTE — Progress Notes (Signed)
Dr. Posey Pronto returned call, OK for renal carb mod diet, will consult about blood pressure and get back.

## 2014-02-01 NOTE — Transfer of Care (Signed)
Immediate Anesthesia Transfer of Care Note  Patient: Brandi Arellano  Procedure(s) Performed: Procedure(s): Right Below Knee Amputation (Right)  Patient Location: PACU  Anesthesia Type:Regional  Level of Consciousness: oriented and patient cooperative  Airway & Oxygen Therapy: Patient Spontanous Breathing and Patient connected to nasal cannula oxygen  Post-op Assessment: Report given to PACU RN and Post -op Vital signs reviewed and stable  Post vital signs: Reviewed  Complications: No apparent anesthesia complications

## 2014-02-01 NOTE — Anesthesia Preprocedure Evaluation (Addendum)
Anesthesia Evaluation  Patient identified by MRN, date of birth, ID band Patient awake    Reviewed: Allergy & Precautions, H&P , NPO status , Patient's Chart, lab work & pertinent test results  Airway       Dental   Pulmonary former smoker,          Cardiovascular hypertension, Pt. on medications + CAD, + CABG and +CHF     Neuro/Psych    GI/Hepatic Neg liver ROS, Patient actively nauseated and vomiting. Dr. Ermalene Postin ordered 12.5mg  phenergan IV - given.   Endo/Other  diabetes, Poorly Controlled, Type 2, Insulin DependentHypothyroidism CBG 261  Renal/GU ESRF and DialysisRenal diseaseT, TH, Sat HD     Musculoskeletal   Abdominal   Peds  Hematology  (+) anemia ,   Anesthesia Other Findings   Reproductive/Obstetrics negative OB ROS                         Anesthesia Physical Anesthesia Plan  ASA: IV  Anesthesia Plan: Regional   Post-op Pain Management:    Induction:   Airway Management Planned: Mask  Additional Equipment:   Intra-op Plan:   Post-operative Plan:   Informed Consent: I have reviewed the patients History and Physical, chart, labs and discussed the procedure including the risks, benefits and alternatives for the proposed anesthesia with the patient or authorized representative who has indicated his/her understanding and acceptance.   Dental advisory given  Plan Discussed with: Surgeon, Anesthesiologist and CRNA  Anesthesia Plan Comments:         Anesthesia Quick Evaluation

## 2014-02-01 NOTE — H&P (View-Only) (Signed)
Patient ID: Brandi Arellano, female   DOB: 1955-10-19, 58 y.o.   MRN: [id number] Right foot with increasing cellulitis and ischemic ulceration fifth metatarsal. Patient is not a revascularization candidate per  vascular vein surgery. Will plan for a transtibial amputation tomorrow afternoon Friday. Patient may require an above-the-knee amputation but limb salvage will be attempted to maximize her ability to ambulate.

## 2014-02-01 NOTE — Anesthesia Procedure Notes (Addendum)
Date/Time: 02/01/2014 2:25 PM Performed by: Jenne Campus Pre-anesthesia Checklist: Patient identified, Emergency Drugs available, Suction available, Patient being monitored and Timeout performed Patient Re-evaluated:Patient Re-evaluated prior to inductionOxygen Delivery Method: Simple face mask    Anesthesia Regional Block:  Popliteal block  Pre-Anesthetic Checklist: ,, timeout performed, Correct Patient, Correct Site, Correct Laterality, Correct Procedure, Correct Position, site marked, Risks and benefits discussed,  Surgical consent,  Pre-op evaluation,  At surgeon's request and post-op pain management  Laterality: Lower and Right  Prep: chloraprep       Needles:  Injection technique: Single-shot  Needle Type: Echogenic Stimulator Needle          Additional Needles:  Procedures: ultrasound guided (picture in chart) Popliteal block  Nerve Stimulator or Paresthesia:  Response: plantarflexion, 0.5 mA,   Additional Responses:   Narrative:  Start time: 02/01/2014 1:51 PM End time: 02/01/2014 1:59 PM Injection made incrementally with aspirations every 5 mL.  Performed by: Personally  Anesthesiologist: Aleja Yearwood  Additional Notes: H+P and labs reviewed, risks and benefits discussed with patient, procedure tolerated well without complications   Anesthesia Regional Block:  Femoral nerve block  Pre-Anesthetic Checklist: ,, timeout performed, Correct Patient, Correct Site, Correct Laterality, Correct Procedure, Correct Position, site marked, Risks and benefits discussed,  Surgical consent,  Pre-op evaluation,  At surgeon's request and post-op pain management  Laterality: Lower and Right  Prep: chloraprep       Needles:  Injection technique: Single-shot  Needle Type: Echogenic Stimulator Needle          Additional Needles:  Procedures: ultrasound guided (picture in chart) and nerve stimulator Femoral nerve block  Nerve Stimulator or Paresthesia:  Response:  quad, 0.5 mA,   Additional Responses:   Narrative:  Start time: 02/01/2014 1:42 PM End time: 02/01/2014 1:50 PM Injection made incrementally with aspirations every 5 mL.  Performed by: Personally  Anesthesiologist: Hakim Minniefield  Additional Notes: H+P and labs reviewed, risks and benefits discussed with patient, procedure tolerated well without complications

## 2014-02-01 NOTE — Progress Notes (Signed)
Subjective:   Nauseous and R foot is hurting. For R BKA today.   Objective Filed Vitals:   01/31/14 1800 01/31/14 2053 01/31/14 2122 02/01/14 0500  BP: 158/78 185/95 148/82 139/111  Pulse: 78 86  87  Temp: 98.2 F (36.8 C) 98.4 F (36.9 C)  98.4 F (36.9 C)  TempSrc: Oral Axillary  Oral  Resp: 18 20  18   Height:      Weight:   54.8 kg (120 lb 13 oz)   SpO2: 98% 100%  97%   Physical Exam General: Alert and oriented. Moaning and uncomfortable Heart: RRR. 2/6 murmur Lungs: CTA, unlabored.  Abdomen: soft, nontender Extremities: R lateral foot ulcer and erythma. L healed transmet. No edema  Dialysis Access:  R IJ cath  Dialysis Orders: [facility] TTS 4 hr 160 Optiflux 400/600 EDW 55 2K 2.25 Ca right I-J profile 4  Aranesp 40 last given 10/15  Hectorol 1   Assessment/Plan:  1. Nonhealing right lateral foot ulcer with osteomyelitis/cellulitis with prob Buerger's dz-  - hx severe PVD/DM- on Vanc and Zosyn/ extremely painful - IV dilaudid; seen by Drs. Sharol Given and Brabham -for BKA today 2. ESRD - TTS - Has been seen in the past for permanent vascular access, but no plan yet - poor options; K 4.1- reassess edw post surgery had not been getting to outpt edw - for HD tomorrow 3. Hypertension/volume - BP variable. No volume excess- not reaching edw. 4. Anemia - Hgb 9.8 10/22 - stable - Aranesp dosed 40 last week . 10/22; Fe 54 tsat 26% ferritin 429 -weekly IV Fe start 10/22 5. Metabolic bone disease - Continue Hectorol and renvela -^ dose to 2 ac - has been on 1 ac P ^ up to 10. -resume binders when diet advanced 6. Nutrition - NPO for OR. multivit Alb 2.7 7. Hypothyroidism - on synthroid  8. DM - per primary. on SSI at home - need to update outpt med list at d/c 9. Pain/ nausea - per Rennis Golden, NP [place] [phone] 02/01/2014,8:45 AM  LOS: 5 days   Pt seen, examined, agree w assess/plan as above with additions as indicated. For R leg amp today. HD  tomorrow.  [name] MD pager [phone]    cell [phone] 02/01/2014, 1:24 PM     Additional Objective Labs: Basic Metabolic Panel:  Recent Labs Lab 01/29/14 0720 01/30/14 0530 01/31/14 0647 02/01/14 0438  NA 134* 140 135* 132*  K 5.0 4.9 5.0 4.1  CL 87* 94* 89* 88*  CO2 20 22 20 22   GLUCOSE 227* 244* 208* 149*  BUN 66* 40* 58* 28*  CREATININE 8.07* 5.67* 7.93* 5.03*  CALCIUM 8.0* 9.1 9.2 9.4  PHOS 8.7* 9.8* 10.1*  --    Liver Function Tests:  Recent Labs Lab 01/29/14 0720 01/30/14 0530 01/31/14 0647  ALBUMIN 2.9* 2.9* 2.7*   No results found for this basename: LIPASE, AMYLASE,  in the last 168 hours CBC:  Recent Labs Lab 01/27/14 1427 01/28/14 0533 01/29/14 0720 01/30/14 0530 01/31/14 0647  WBC 7.8 6.0 8.9 7.9 9.5  HGB 10.7* 9.6* 9.5* 9.9* 9.8*  HCT 31.9* 29.0* 29.5* 30.1* 29.8*  MCV 91.7 91.8 93.4 95.6 94.3  PLT 139* 126* 150 167 204   Blood Culture    Component Value Date/Time   SDES BLOOD RIGHT HAND 01/27/2014 1722   SPECREQUEST BOTTLES DRAWN AEROBIC AND ANAEROBIC 5CC EA 01/27/2014 1722   CULT  Value:  BLOOD CULTURE RECEIVED NO GROWTH TO DATE CULTURE WILL BE HELD FOR 5 DAYS BEFORE ISSUING A FINAL NEGATIVE REPORT Performed at [facility] 01/27/2014 1722   REPTSTATUS PENDING 01/27/2014 1722    Cardiac Enzymes: No results found for this basename: CKTOTAL, CKMB, CKMBINDEX, TROPONINI,  in the last 168 hours CBG:  Recent Labs Lab 01/31/14 0752 01/31/14 1141 01/31/14 1715 01/31/14 2052 02/01/14 0746  GLUCAP 226* 308* 77 138* 182*   Iron Studies: No results found for this basename: IRON, TIBC, TRANSFERRIN, FERRITIN,  in the last 72 hours @lablastinr3 @ Studies/Results: No results found. Medications:   .  ceFAZolin (ANCEF) IV  2 g Intravenous On Call to OR  . chlorhexidine  60 mL Topical Once  . darbepoetin (ARANESP) injection - DIALYSIS  40 mcg Intravenous Q Thu-HD  . doxercalciferol  1 mcg Intravenous Q T,Th,Sa-HD  .  ferric gluconate (FERRLECIT/NULECIT) IV  125 mg Intravenous Q Thu-HD  . insulin aspart  0-9 Units Subcutaneous TID WC  . insulin detemir  9 Units Subcutaneous QHS  . levothyroxine  25 mcg Oral QAC breakfast  . multivitamin  1 tablet Oral QHS  . OxyCODONE  20 mg Oral Q12H  . piperacillin-tazobactam (ZOSYN)  IV  2.25 g Intravenous 3 times per day  . senna-docusate  1 tablet Oral BID  . sevelamer carbonate  1,600 mg Oral TID WC  . sodium chloride  3 mL Intravenous Q12H  . vancomycin  500 mg Intravenous Q T,Th,Sa-HD

## 2014-02-01 NOTE — Progress Notes (Signed)
Subjective: The patient is sitting up in bed with a emesis basin complaining of nausea without vomiting. Objective: Vital signs in last 24 hours: Filed Vitals:   01/31/14 2053 01/31/14 2122 02/01/14 0500 02/01/14 0946  BP: 185/95 148/82 139/111 184/81  Pulse: 86  87 80  Temp: 98.4 F (36.9 C)  98.4 F (36.9 C) 97.8 F (36.6 C)  TempSrc: Axillary  Oral Oral  Resp: $Remo'20  18 22  'NmzPf$ Height:      Weight:  54.8 kg (120 lb 13 oz)    SpO2: 100%  97% 96%   Weight change:   Intake/Output Summary (Last 24 hours) at 02/01/14 1146 Last data filed at 02/01/14 0900  Gross per 24 hour  Intake    120 ml  Output   1215 ml  Net  -1095 ml   BP 184/81  Pulse 80  Temp(Src) 97.8 F (36.6 C) (Oral)  Resp 22  Ht $R'5\' 3"'bl$  (1.6 m)  Wt 54.8 kg (120 lb 13 oz)  BMI 21.41 kg/m2  SpO2 96%  General Appearance:    Alert, nauseated.  Head:    Normocephalic, without obvious abnormality, atraumatic  Eyes:    No scleral icterus  Ears:    Deferred  Nose:   Deferred  Throat:   Deferred  Neck:   Supple, symmetrical, trachea midline.  Back:     Deferred  Lungs:     Clear to auscultation bilaterally, normal work of breathing  Chest Wall:    Well healed midline incision   Heart:    Regular rate and rhythm, systolic murmur   Breast Exam:    Deferred  Abdomen:     Soft, non-tender, bowel sounds active all four quadrants  Genitalia:    Deferred  Rectal:    Deferred  Extremities:   Right hallux amputated, right lateral foot necrotic lesion approx 3cm x 5cm with adjacent area of duskiness that has extended beyond initial demarcation, no pulse palpable. Left foot all toes amputated, warm and well perfused.               Lab Results: Recent Results (from the past 2160 hour(s))  GLUCOSE, CAPILLARY     Status: Abnormal   Collection Time    12/26/13  9:02 AM      Result Value Ref Range   Glucose-Capillary 265 (*) 70 - 99 mg/dL  POCT I-STAT, CHEM 8     Status: Abnormal   Collection Time    12/26/13  9:28 AM        Result Value Ref Range   Sodium 136 (*) 137 - 147 mEq/L   Potassium 4.1  3.7 - 5.3 mEq/L   Chloride 98  96 - 112 mEq/L   BUN 46 (*) 6 - 23 mg/dL   Creatinine, Ser 5.70 (*) 0.50 - 1.10 mg/dL   Glucose, Bld 272 (*) 70 - 99 mg/dL   Calcium, Ion 1.02 (*) 1.12 - 1.23 mmol/L   TCO2 25  0 - 100 mmol/L   Hemoglobin 16.0 (*) 12.0 - 15.0 g/dL   HCT 47.0 (*) 36.0 - 46.0 %  GLUCOSE, CAPILLARY     Status: Abnormal   Collection Time    12/26/13 12:37 PM      Result Value Ref Range   Glucose-Capillary 250 (*) 70 - 99 mg/dL  GLUCOSE, CAPILLARY     Status: Abnormal   Collection Time    12/26/13  3:59 PM      Result Value Ref Range  Glucose-Capillary 497 (*) 70 - 99 mg/dL  GLUCOSE, RANDOM     Status: Abnormal   Collection Time    12/26/13  4:00 PM      Result Value Ref Range   Glucose, Bld 538 (*) 70 - 99 mg/dL  GLUCOSE, CAPILLARY     Status: Abnormal   Collection Time    12/26/13  4:33 PM      Result Value Ref Range   Glucose-Capillary 523 (*) 70 - 99 mg/dL   Comment 1 Notify RN    GLUCOSE, CAPILLARY     Status: Abnormal   Collection Time    12/26/13  5:33 PM      Result Value Ref Range   Glucose-Capillary 370 (*) 70 - 99 mg/dL  GLUCOSE, CAPILLARY     Status: Abnormal   Collection Time    12/26/13  6:21 PM      Result Value Ref Range   Glucose-Capillary 327 (*) 70 - 99 mg/dL  MAGNESIUM     Status: None   Collection Time    12/26/13  7:20 PM      Result Value Ref Range   Magnesium 2.1  1.5 - 2.5 mg/dL  PHOSPHORUS     Status: Abnormal   Collection Time    12/26/13  7:20 PM      Result Value Ref Range   Phosphorus 7.5 (*) 2.3 - 4.6 mg/dL  TSH     Status: None   Collection Time    12/26/13  7:20 PM      Result Value Ref Range   TSH 2.070  0.350 - 4.500 uIU/mL  HEMOGLOBIN A1C     Status: Abnormal   Collection Time    12/26/13  7:20 PM      Result Value Ref Range   Hemoglobin A1C 9.1 (*) <5.7 %   Comment: (NOTE)                                                                                According to the ADA Clinical Practice Recommendations for 2011, when     HbA1c is used as a screening test:      >=6.5%   Diagnostic of Diabetes Mellitus               (if abnormal result is confirmed)     5.7-6.4%   Increased risk of developing Diabetes Mellitus     References:Diagnosis and Classification of Diabetes Mellitus,Diabetes     GNFA,2130,86(VHQIO 1):S62-S69 and Standards of Medical Care in             Diabetes - 2011,Diabetes Care,2011,34 (Suppl 1):S11-S61.   Mean Plasma Glucose 214 (*) <117 mg/dL   Comment: Performed at [place]     Status: Abnormal   Collection Time    12/26/13  7:20 PM      Result Value Ref Range   Sodium 130 (*) 137 - 147 mEq/L   Potassium 4.3  3.7 - 5.3 mEq/L   Chloride 89 (*) 96 - 112 mEq/L   Comment: DELTA CHECK NOTED   CO2 22  19 - 32 mEq/L   Glucose, Bld 224 (*) 70 -  99 mg/dL   BUN 56 (*) 6 - 23 mg/dL   Creatinine, Ser 5.96 (*) 0.50 - 1.10 mg/dL   Calcium 8.3 (*) 8.4 - 10.5 mg/dL   Total Protein 7.3  6.0 - 8.3 g/dL   Albumin 3.3 (*) 3.5 - 5.2 g/dL   AST 318 (*) 0 - 37 U/L   ALT 207 (*) 0 - 35 U/L   Alkaline Phosphatase 180 (*) 39 - 117 U/L   Total Bilirubin 0.3  0.3 - 1.2 mg/dL   GFR calc non Af Amer 7 (*) >90 mL/min   GFR calc Af Amer 8 (*) >90 mL/min   Comment: (NOTE)     The eGFR has been calculated using the CKD EPI equation.     This calculation has not been validated in all clinical situations.     eGFR's persistently <90 mL/min signify possible Chronic Kidney     Disease.   Anion gap 19 (*) 5 - 15  GLUCOSE, CAPILLARY     Status: Abnormal   Collection Time    12/26/13  7:46 PM      Result Value Ref Range   Glucose-Capillary 200 (*) 70 - 99 mg/dL  GLUCOSE, CAPILLARY     Status: None   Collection Time    12/27/13 12:10 AM      Result Value Ref Range   Glucose-Capillary 97  70 - 99 mg/dL  GLUCOSE, CAPILLARY     Status: Abnormal   Collection Time    12/27/13  4:09  AM      Result Value Ref Range   Glucose-Capillary 257 (*) 70 - 99 mg/dL  GLUCOSE, CAPILLARY     Status: Abnormal   Collection Time    12/27/13  7:53 AM      Result Value Ref Range   Glucose-Capillary 107 (*) 70 - 99 mg/dL  CBC     Status: Abnormal   Collection Time    01/27/14  2:27 PM      Result Value Ref Range   WBC 7.8  4.0 - 10.5 K/uL   RBC 3.48 (*) 3.87 - 5.11 MIL/uL   Hemoglobin 10.7 (*) 12.0 - 15.0 g/dL   HCT 31.9 (*) 36.0 - 46.0 %   MCV 91.7  78.0 - 100.0 fL   MCH 30.7  26.0 - 34.0 pg   MCHC 33.5  30.0 - 36.0 g/dL   RDW 13.3  11.5 - 15.5 %   Platelets 139 (*) 150 - 400 K/uL  BASIC METABOLIC PANEL     Status: Abnormal   Collection Time    01/27/14  2:27 PM      Result Value Ref Range   Sodium 126 (*) 137 - 147 mEq/L   Potassium 4.3  3.7 - 5.3 mEq/L   Chloride 83 (*) 96 - 112 mEq/L   CO2 21  19 - 32 mEq/L   Glucose, Bld 338 (*) 70 - 99 mg/dL   BUN 44 (*) 6 - 23 mg/dL   Creatinine, Ser 4.88 (*) 0.50 - 1.10 mg/dL   Calcium 8.8  8.4 - 10.5 mg/dL   GFR calc non Af Amer 9 (*) >90 mL/min   GFR calc Af Amer 11 (*) >90 mL/min   Comment: (NOTE)     The eGFR has been calculated using the CKD EPI equation.     This calculation has not been validated in all clinical situations.     eGFR's persistently <90 mL/min signify possible Chronic Kidney  Disease.   Anion gap 22 (*) 5 - 15  POC OCCULT BLOOD, ED     Status: None   Collection Time    01/27/14  3:58 PM      Result Value Ref Range   Fecal Occult Bld NEGATIVE  NEGATIVE  I-STAT CG4 LACTIC ACID, ED     Status: None   Collection Time    01/27/14  4:18 PM      Result Value Ref Range   Lactic Acid, Venous 1.38  0.5 - 2.2 mmol/L  CBG MONITORING, ED     Status: Abnormal   Collection Time    01/27/14  4:31 PM      Result Value Ref Range   Glucose-Capillary 277 (*) 70 - 99 mg/dL  CULTURE, BLOOD (ROUTINE X 2)     Status: None   Collection Time    01/27/14  5:05 PM      Result Value Ref Range   Specimen Description  BLOOD LEFT FOREARM     Special Requests BOTTLES DRAWN AEROBIC ONLY 4CC     Culture  Setup Time       Value: 01/27/2014 22:34     Performed at [facility]   Culture       Value:        BLOOD CULTURE RECEIVED NO GROWTH TO DATE CULTURE WILL BE HELD FOR 5 DAYS BEFORE ISSUING A FINAL NEGATIVE REPORT     Performed at [facility]   Report Status PENDING    CULTURE, BLOOD (ROUTINE X 2)     Status: None   Collection Time    01/27/14  5:22 PM      Result Value Ref Range   Specimen Description BLOOD RIGHT HAND     Special Requests BOTTLES DRAWN AEROBIC AND ANAEROBIC 5CC EA     Culture  Setup Time       Value: 01/27/2014 22:33     Performed at [facility]   Culture       Value:        BLOOD CULTURE RECEIVED NO GROWTH TO DATE CULTURE WILL BE HELD FOR 5 DAYS BEFORE ISSUING A FINAL NEGATIVE REPORT     Performed at [facility]   Report Status PENDING    PROTIME-INR     Status: None   Collection Time    01/27/14  5:23 PM      Result Value Ref Range   Prothrombin Time 14.6  11.6 - 15.2 seconds   INR 1.13  0.00 - 1.49  GLUCOSE, CAPILLARY     Status: Abnormal   Collection Time    01/27/14  9:31 PM      Result Value Ref Range   Glucose-Capillary 233 (*) 70 - 99 mg/dL  KETONES, QUALITATIVE     Status: None   Collection Time    01/27/14 10:53 PM      Result Value Ref Range   Acetone, Bld NEGATIVE  NEGATIVE  SALICYLATE LEVEL     Status: Abnormal   Collection Time    01/27/14 10:53 PM      Result Value Ref Range   Salicylate Lvl <7.7 (*) 2.8 - 20.0 mg/dL  ACETAMINOPHEN LEVEL     Status: None   Collection Time    01/27/14 10:53 PM      Result Value Ref Range   Acetaminophen (Tylenol), Serum <15.0  10 - 30 ug/mL   Comment:  THERAPEUTIC CONCENTRATIONS VARY     SIGNIFICANTLY. A RANGE OF 10-30     ug/mL MAY BE AN EFFECTIVE     CONCENTRATION FOR MANY PATIENTS.     HOWEVER, SOME ARE BEST TREATED     AT CONCENTRATIONS OUTSIDE THIS     RANGE.      ACETAMINOPHEN CONCENTRATIONS     >150 ug/mL AT 4 HOURS AFTER     INGESTION AND >50 ug/mL AT 12     HOURS AFTER INGESTION ARE     OFTEN ASSOCIATED WITH TOXIC     REACTIONS.  BASIC METABOLIC PANEL     Status: Abnormal   Collection Time    01/27/14 10:53 PM      Result Value Ref Range   Sodium 129 (*) 137 - 147 mEq/L   Potassium 4.2  3.7 - 5.3 mEq/L   Chloride 86 (*) 96 - 112 mEq/L   CO2 23  19 - 32 mEq/L   Glucose, Bld 306 (*) 70 - 99 mg/dL   BUN 47 (*) 6 - 23 mg/dL   Creatinine, Ser 5.46 (*) 0.50 - 1.10 mg/dL   Calcium 8.1 (*) 8.4 - 10.5 mg/dL   GFR calc non Af Amer 8 (*) >90 mL/min   GFR calc Af Amer 9 (*) >90 mL/min   Comment: (NOTE)     The eGFR has been calculated using the CKD EPI equation.     This calculation has not been validated in all clinical situations.     eGFR's persistently <90 mL/min signify possible Chronic Kidney     Disease.   Anion gap 20 (*) 5 - 15  PHOSPHORUS     Status: Abnormal   Collection Time    01/27/14 10:53 PM      Result Value Ref Range   Phosphorus 6.8 (*) 2.3 - 4.6 mg/dL  GLUCOSE, CAPILLARY     Status: Abnormal   Collection Time    01/27/14 11:59 PM      Result Value Ref Range   Glucose-Capillary 257 (*) 70 - 99 mg/dL  GLUCOSE, CAPILLARY     Status: Abnormal   Collection Time    01/28/14  4:05 AM      Result Value Ref Range   Glucose-Capillary 118 (*) 70 - 99 mg/dL  MRSA PCR SCREENING     Status: None   Collection Time    01/28/14  4:24 AM      Result Value Ref Range   MRSA by PCR NEGATIVE  NEGATIVE   Comment:            The GeneXpert MRSA Assay (FDA     approved for NASAL specimens     only), is one component of a     comprehensive MRSA colonization     surveillance program. It is not     intended to diagnose MRSA     infection nor to guide or     monitor treatment for     MRSA infections.  CBC     Status: Abnormal   Collection Time    01/28/14  5:33 AM      Result Value Ref Range   WBC 6.0  4.0 - 10.5 K/uL   RBC 3.16 (*)  3.87 - 5.11 MIL/uL   Hemoglobin 9.6 (*) 12.0 - 15.0 g/dL   HCT 29.0 (*) 36.0 - 46.0 %   MCV 91.8  78.0 - 100.0 fL   MCH 30.4  26.0 - 34.0 pg   MCHC 33.1  30.0 - 36.0 g/dL   RDW 13.3  11.5 - 15.5 %   Platelets 126 (*) 150 - 400 K/uL  BASIC METABOLIC PANEL     Status: Abnormal   Collection Time    01/28/14  5:33 AM      Result Value Ref Range   Sodium 135 (*) 137 - 147 mEq/L   Potassium 4.6  3.7 - 5.3 mEq/L   Chloride 89 (*) 96 - 112 mEq/L   CO2 24  19 - 32 mEq/L   Glucose, Bld 133 (*) 70 - 99 mg/dL   BUN 53 (*) 6 - 23 mg/dL   Creatinine, Ser 6.23 (*) 0.50 - 1.10 mg/dL   Calcium 8.3 (*) 8.4 - 10.5 mg/dL   GFR calc non Af Amer 7 (*) >90 mL/min   GFR calc Af Amer 8 (*) >90 mL/min   Comment: (NOTE)     The eGFR has been calculated using the CKD EPI equation.     This calculation has not been validated in all clinical situations.     eGFR's persistently <90 mL/min signify possible Chronic Kidney     Disease.   Anion gap 22 (*) 5 - 15  GLUCOSE, CAPILLARY     Status: Abnormal   Collection Time    01/28/14  7:42 AM      Result Value Ref Range   Glucose-Capillary 154 (*) 70 - 99 mg/dL  GLUCOSE, CAPILLARY     Status: Abnormal   Collection Time    01/28/14 11:27 AM      Result Value Ref Range   Glucose-Capillary 136 (*) 70 - 99 mg/dL  GLUCOSE, CAPILLARY     Status: Abnormal   Collection Time    01/28/14  5:33 PM      Result Value Ref Range   Glucose-Capillary 205 (*) 70 - 99 mg/dL  GLUCOSE, CAPILLARY     Status: Abnormal   Collection Time    01/28/14  8:38 PM      Result Value Ref Range   Glucose-Capillary 173 (*) 70 - 99 mg/dL  CBC     Status: Abnormal   Collection Time    01/29/14  7:20 AM      Result Value Ref Range   WBC 8.9  4.0 - 10.5 K/uL   RBC 3.16 (*) 3.87 - 5.11 MIL/uL   Hemoglobin 9.5 (*) 12.0 - 15.0 g/dL   HCT 29.5 (*) 36.0 - 46.0 %   MCV 93.4  78.0 - 100.0 fL   MCH 30.1  26.0 - 34.0 pg   MCHC 32.2  30.0 - 36.0 g/dL   RDW 13.5  11.5 - 15.5 %   Platelets 150   150 - 400 K/uL  RENAL FUNCTION PANEL     Status: Abnormal   Collection Time    01/29/14  7:20 AM      Result Value Ref Range   Sodium 134 (*) 137 - 147 mEq/L   Potassium 5.0  3.7 - 5.3 mEq/L   Chloride 87 (*) 96 - 112 mEq/L   CO2 20  19 - 32 mEq/L   Glucose, Bld 227 (*) 70 - 99 mg/dL   BUN 66 (*) 6 - 23 mg/dL   Creatinine, Ser 8.07 (*) 0.50 - 1.10 mg/dL   Calcium 8.0 (*) 8.4 - 10.5 mg/dL   Phosphorus 8.7 (*) 2.3 - 4.6 mg/dL   Albumin 2.9 (*) 3.5 - 5.2 g/dL   GFR calc non Af Amer 5 (*) >90 mL/min  GFR calc Af Amer 6 (*) >90 mL/min   Comment: (NOTE)     The eGFR has been calculated using the CKD EPI equation.     This calculation has not been validated in all clinical situations.     eGFR's persistently <90 mL/min signify possible Chronic Kidney     Disease.   Anion gap 27 (*) 5 - 15  HEPATITIS B SURFACE ANTIGEN     Status: None   Collection Time    01/29/14  7:20 AM      Result Value Ref Range   Hepatitis B Surface Ag NEGATIVE  NEGATIVE   Comment: Performed at [place]     Status: Abnormal   Collection Time    01/29/14  7:20 AM      Result Value Ref Range   Ferritin 429 (*) 10 - 291 ng/mL   Comment: Performed at [place] TIBC     Status: Abnormal   Collection Time    01/29/14  7:20 AM      Result Value Ref Range   Iron 54  42 - 135 ug/dL   TIBC 210 (*) 250 - 470 ug/dL   Saturation Ratios 26  20 - 55 %   UIBC 156  125 - 400 ug/dL   Comment: Performed at [place], CAPILLARY     Status: Abnormal   Collection Time    01/29/14 11:57 AM      Result Value Ref Range   Glucose-Capillary 108 (*) 70 - 99 mg/dL  GLUCOSE, CAPILLARY     Status: Abnormal   Collection Time    01/29/14  4:45 PM      Result Value Ref Range   Glucose-Capillary 192 (*) 70 - 99 mg/dL  GLUCOSE, CAPILLARY     Status: Abnormal   Collection Time    01/29/14  9:04 PM      Result Value Ref Range   Glucose-Capillary 229 (*) 70 - 99 mg/dL    CBC     Status: Abnormal   Collection Time    01/30/14  5:30 AM      Result Value Ref Range   WBC 7.9  4.0 - 10.5 K/uL   RBC 3.15 (*) 3.87 - 5.11 MIL/uL   Hemoglobin 9.9 (*) 12.0 - 15.0 g/dL   HCT 30.1 (*) 36.0 - 46.0 %   MCV 95.6  78.0 - 100.0 fL   MCH 31.4  26.0 - 34.0 pg   MCHC 32.9  30.0 - 36.0 g/dL   RDW 13.7  11.5 - 15.5 %   Platelets 167  150 - 400 K/uL  RENAL FUNCTION PANEL     Status: Abnormal   Collection Time    01/30/14  5:30 AM      Result Value Ref Range   Sodium 140  137 - 147 mEq/L   Potassium 4.9  3.7 - 5.3 mEq/L   Chloride 94 (*) 96 - 112 mEq/L   Comment: DELTA CHECK NOTED     NO VISIBLE HEMOLYSIS   CO2 22  19 - 32 mEq/L   Glucose, Bld 244 (*) 70 - 99 mg/dL   BUN 40 (*) 6 - 23 mg/dL   Comment: DELTA CHECK NOTED   Creatinine, Ser 5.67 (*) 0.50 - 1.10 mg/dL   Calcium 9.1  8.4 - 10.5 mg/dL   Phosphorus 9.8 (*) 2.3 - 4.6 mg/dL   Albumin 2.9 (*) 3.5 - 5.2 g/dL  GFR calc non Af Amer 8 (*) >90 mL/min   GFR calc Af Amer 9 (*) >90 mL/min   Comment: (NOTE)     The eGFR has been calculated using the CKD EPI equation.     This calculation has not been validated in all clinical situations.     eGFR's persistently <90 mL/min signify possible Chronic Kidney     Disease.   Anion gap 24 (*) 5 - 15  GLUCOSE, CAPILLARY     Status: Abnormal   Collection Time    01/30/14  7:30 AM      Result Value Ref Range   Glucose-Capillary 230 (*) 70 - 99 mg/dL  GLUCOSE, CAPILLARY     Status: Abnormal   Collection Time    01/30/14  1:07 PM      Result Value Ref Range   Glucose-Capillary 249 (*) 70 - 99 mg/dL  GLUCOSE, CAPILLARY     Status: Abnormal   Collection Time    01/30/14  5:11 PM      Result Value Ref Range   Glucose-Capillary 216 (*) 70 - 99 mg/dL  GLUCOSE, CAPILLARY     Status: Abnormal   Collection Time    01/30/14  8:47 PM      Result Value Ref Range   Glucose-Capillary 252 (*) 70 - 99 mg/dL  RENAL FUNCTION PANEL     Status: Abnormal   Collection Time     01/31/14  6:47 AM      Result Value Ref Range   Sodium 135 (*) 137 - 147 mEq/L   Potassium 5.0  3.7 - 5.3 mEq/L   Chloride 89 (*) 96 - 112 mEq/L   CO2 20  19 - 32 mEq/L   Glucose, Bld 208 (*) 70 - 99 mg/dL   BUN 58 (*) 6 - 23 mg/dL   Creatinine, Ser 7.93 (*) 0.50 - 1.10 mg/dL   Calcium 9.2  8.4 - 10.5 mg/dL   Phosphorus 10.1 (*) 2.3 - 4.6 mg/dL   Albumin 2.7 (*) 3.5 - 5.2 g/dL   GFR calc non Af Amer 5 (*) >90 mL/min   GFR calc Af Amer 6 (*) >90 mL/min   Comment: (NOTE)     The eGFR has been calculated using the CKD EPI equation.     This calculation has not been validated in all clinical situations.     eGFR's persistently <90 mL/min signify possible Chronic Kidney     Disease.   Anion gap 26 (*) 5 - 15  CBC     Status: Abnormal   Collection Time    01/31/14  6:47 AM      Result Value Ref Range   WBC 9.5  4.0 - 10.5 K/uL   RBC 3.16 (*) 3.87 - 5.11 MIL/uL   Hemoglobin 9.8 (*) 12.0 - 15.0 g/dL   HCT 29.8 (*) 36.0 - 46.0 %   MCV 94.3  78.0 - 100.0 fL   MCH 31.0  26.0 - 34.0 pg   MCHC 32.9  30.0 - 36.0 g/dL   RDW 13.8  11.5 - 15.5 %   Platelets 204  150 - 400 K/uL  GLUCOSE, CAPILLARY     Status: Abnormal   Collection Time    01/31/14  7:52 AM      Result Value Ref Range   Glucose-Capillary 226 (*) 70 - 99 mg/dL  GLUCOSE, CAPILLARY     Status: Abnormal   Collection Time    01/31/14 11:41 AM  Result Value Ref Range   Glucose-Capillary 308 (*) 70 - 99 mg/dL  GLUCOSE, CAPILLARY     Status: None   Collection Time    01/31/14  5:15 PM      Result Value Ref Range   Glucose-Capillary 77  70 - 99 mg/dL  GLUCOSE, CAPILLARY     Status: Abnormal   Collection Time    01/31/14  8:52 PM      Result Value Ref Range   Glucose-Capillary 138 (*) 70 - 99 mg/dL  SURGICAL PCR SCREEN     Status: None   Collection Time    01/31/14  9:52 PM      Result Value Ref Range   MRSA, PCR NEGATIVE  NEGATIVE   Staphylococcus aureus NEGATIVE  NEGATIVE   Comment:            The Xpert SA Assay  (FDA     approved for NASAL specimens     in patients over 53 years of age),     is one component of     a comprehensive surveillance     program.  Test performance has     been validated by [employer] for patients greater     than or equal to 68 year old.     It is not intended     to diagnose infection nor to     guide or monitor treatment.  BASIC METABOLIC PANEL     Status: Abnormal   Collection Time    02/01/14  4:38 AM      Result Value Ref Range   Sodium 132 (*) 137 - 147 mEq/L   Potassium 4.1  3.7 - 5.3 mEq/L   Comment: DELTA CHECK NOTED   Chloride 88 (*) 96 - 112 mEq/L   CO2 22  19 - 32 mEq/L   Glucose, Bld 149 (*) 70 - 99 mg/dL   BUN 28 (*) 6 - 23 mg/dL   Comment: DELTA CHECK NOTED   Creatinine, Ser 5.03 (*) 0.50 - 1.10 mg/dL   Comment: DELTA CHECK NOTED   Calcium 9.4  8.4 - 10.5 mg/dL   GFR calc non Af Amer 9 (*) >90 mL/min   GFR calc Af Amer 10 (*) >90 mL/min   Comment: (NOTE)     The eGFR has been calculated using the CKD EPI equation.     This calculation has not been validated in all clinical situations.     eGFR's persistently <90 mL/min signify possible Chronic Kidney     Disease.   Anion gap 22 (*) 5 - 15  GLUCOSE, CAPILLARY     Status: Abnormal   Collection Time    02/01/14  7:46 AM      Result Value Ref Range   Glucose-Capillary 182 (*) 70 - 99 mg/dL  GLUCOSE, CAPILLARY     Status: Abnormal   Collection Time    02/01/14 11:41 AM      Result Value Ref Range   Glucose-Capillary 261 (*) 70 - 99 mg/dL   Micro Results: Recent Results (from the past 240 hour(s))  CULTURE, BLOOD (ROUTINE X 2)     Status: None   Collection Time    01/27/14  5:05 PM      Result Value Ref Range Status   Specimen Description BLOOD LEFT FOREARM   Final   Special Requests BOTTLES DRAWN AEROBIC ONLY 4CC   Final   Culture  Setup Time  Final   Value: 01/27/2014 22:34     Performed at [facility]   Culture     Final   Value:        BLOOD CULTURE RECEIVED NO  GROWTH TO DATE CULTURE WILL BE HELD FOR 5 DAYS BEFORE ISSUING A FINAL NEGATIVE REPORT     Performed at [facility]   Report Status PENDING   Incomplete  CULTURE, BLOOD (ROUTINE X 2)     Status: None   Collection Time    01/27/14  5:22 PM      Result Value Ref Range Status   Specimen Description BLOOD RIGHT HAND   Final   Special Requests BOTTLES DRAWN AEROBIC AND ANAEROBIC 5CC EA   Final   Culture  Setup Time     Final   Value: 01/27/2014 22:33     Performed at [facility]   Culture     Final   Value:        BLOOD CULTURE RECEIVED NO GROWTH TO DATE CULTURE WILL BE HELD FOR 5 DAYS BEFORE ISSUING A FINAL NEGATIVE REPORT     Performed at [facility]   Report Status PENDING   Incomplete  MRSA PCR SCREENING     Status: None   Collection Time    01/28/14  4:24 AM      Result Value Ref Range Status   MRSA by PCR NEGATIVE  NEGATIVE Final   Comment:            The GeneXpert MRSA Assay (FDA     approved for NASAL specimens     only), is one component of a     comprehensive MRSA colonization     surveillance program. It is not     intended to diagnose MRSA     infection nor to guide or     monitor treatment for     MRSA infections.  SURGICAL PCR SCREEN     Status: None   Collection Time    01/31/14  9:52 PM      Result Value Ref Range Status   MRSA, PCR NEGATIVE  NEGATIVE Final   Staphylococcus aureus NEGATIVE  NEGATIVE Final   Comment:            The Xpert SA Assay (FDA     approved for NASAL specimens     in patients over 89 years of age),     is one component of     a comprehensive surveillance     program.  Test performance has     been validated by [employer] for patients greater     than or equal to 59 year old.     It is not intended     to diagnose infection nor to     guide or monitor treatment.   Medications: I have reviewed the patient's current medications. 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 * Vascular consult: unable to revascularize, ortho consult: BKA scheduled today * Cont vanc and zosyn * Blood cultures 10/18 - no growth to date * Pain well controlled overnight with PO oxycontin $RemoveBefo'20mg'[id number]$  q12h and oxycodone $RemoveBefor'5mg'jjHPPihFVNaO$  q6h prn (  only 2 breakthrough doses required in past 24 hours). Will consider IV pain control if continued nausea and unable to tolerate PO meds.  Diabetes mellitus type II: * Blood glucose consistently above 200, increased levemir yesterday to 9 units from 3 units * Continue sensitive sliding scale insulin aspart * Will re eval after surgery * NPO at midnight  ESRD:  * HD tomorrow, nephro following  Anemia: * Stable, continue to follow CBCs  CAD: * Hold home meds  HTN: * Occasional elevated systolics likely 2/2 pain management, will hold home meds pending surgery.  Hypothyroidism: * Continue home meds  FENGI: * NPO for surgery. * Phenergan 12.$RemoveBefore'5mg'[id number]$  prn  PPX: * Heparin 5000U subq  This is a Careers information officer Note.  The care of the patient was discussed with Dr. Daryll Drown and the assessment and plan formulated with their assistance.  Please see their attached note for official documentation of the daily encounter.   LOS: 5 days   Jerene Dilling, Med Student 02/01/2014, 11:46 AM

## 2014-02-01 NOTE — Interval H&P Note (Signed)
History and Physical Interval Note:  02/01/2014 6:38 AM  Brandi Arellano  has presented today for surgery, with the diagnosis of Gangrene Right Foot  The various methods of treatment have been discussed with the patient and family. After consideration of risks, benefits and other options for treatment, the patient has consented to  Procedure(s): Right Below Knee Amputation (Right) as a surgical intervention .  The patient's history has been reviewed, patient examined, no change in status, stable for surgery.  I have reviewed the patient's chart and labs.  Questions were answered to the patient's satisfaction.     DUDA,MARCUS V

## 2014-02-01 NOTE — Progress Notes (Signed)
Pt gone down via bed for right BKA.

## 2014-02-01 NOTE — Anesthesia Postprocedure Evaluation (Signed)
  Anesthesia Post-op Note  Patient: Brandi Arellano  Procedure(s) Performed: Procedure(s): Right Below Knee Amputation (Right)  Patient Location: PACU  Anesthesia Type:MAC and Regional  Level of Consciousness: awake  Airway and Oxygen Therapy: Patient Spontanous Breathing and Patient connected to nasal cannula oxygen  Post-op Pain: none  Post-op Assessment: Post-op Vital signs reviewed, Patient's Cardiovascular Status Stable, Respiratory Function Stable, Patent Airway, No signs of Nausea or vomiting and Pain level controlled  Post-op Vital Signs: Reviewed and stable  Last Vitals:  Filed Vitals:   02/01/14 1525  BP: 163/86  Pulse: 88  Temp:   Resp: 20    Complications: No apparent anesthesia complications

## 2014-02-01 NOTE — Op Note (Signed)
01/27/2014 - 02/01/2014  4:18 PM  PATIENT:  Brandi Arellano    PRE-OPERATIVE DIAGNOSIS:  Gangrene Right Foot  POST-OPERATIVE DIAGNOSIS:  Same  PROCEDURE:  Right Below Knee Amputation  SURGEON:  Newt Minion, MD  PHYSICIAN ASSISTANT:None ANESTHESIA:   General  PREOPERATIVE INDICATIONS:  [name] is a  58 y.o. female with a diagnosis of Gangrene Right Foot who failed conservative measures and elected for surgical management.    The risks benefits and alternatives were discussed with the patient preoperatively including but not limited to the risks of infection, bleeding, nerve injury, cardiopulmonary complications, the need for revision surgery, among others, and the patient was willing to proceed.  OPERATIVE IMPLANTS: None  OPERATIVE FINDINGS: Good petechial bleeding  OPERATIVE PROCEDURE: Patient was evaluated by vascular surgery for revascularization. Patient was not revascularization candidate. Patient had complaints of nausea  and it was elected to proceed with surgery with a regional anesthetic. Patient underwent a femoral and popliteal block. After adequate levels of anesthesia were obtained patient's right lower extremity was prepped using DuraPrep draped into a sterile field. A transverse incision was made 11 cm distal to the tibial tubercle this curved proximally and a large posterior flap was created. The tibia was transected 1 cm proximal to the skin incision and the fibula was transected just proximal to the tibial incision. A large posterior flap was created. The sciatic nerve was pulled cut and allowed to retract. The vascular bundles were suture ligated with 2-0 silk. The tourniquet was deflated hemostasis was obtained. The deep and superficial fascial layers were closed using #1 Vicryl. The skin was closed using staples. The wound was covered with a sterile compressive dressing. Patient was extubated taken to the PACU in stable condition.

## 2014-02-01 NOTE — Progress Notes (Signed)
Paged teaching service. Advised zofran not effective for nausea.  Teaching service advised would put in order for phenergan IV or PO.

## 2014-02-01 NOTE — Progress Notes (Signed)
Pt back from right bka, BP 175/102 hr 93 temp 98.7 O2 sat 100 on 2 lpm via [place]. Pt still nauseated paged Dr.Mullen FYI.

## 2014-02-02 LAB — RENAL FUNCTION PANEL
Albumin: 2.9 g/dL — ABNORMAL LOW (ref 3.5–5.2)
Anion gap: 23 — ABNORMAL HIGH (ref 5–15)
BUN: 53 mg/dL — AB (ref 6–23)
CO2: 22 meq/L (ref 19–32)
CREATININE: 8.18 mg/dL — AB (ref 0.50–1.10)
Calcium: 9.1 mg/dL (ref 8.4–10.5)
Chloride: 89 mEq/L — ABNORMAL LOW (ref 96–112)
GFR calc Af Amer: 6 mL/min — ABNORMAL LOW (ref >90)
GFR, EST NON AFRICAN AMERICAN: 5 mL/min — AB (ref >90)
Glucose, Bld: 204 mg/dL — ABNORMAL HIGH (ref 70–99)
Phosphorus: 10.6 mg/dL — ABNORMAL HIGH (ref 2.3–4.6)
Potassium: 4 mEq/L (ref 3.7–5.3)
Sodium: 134 mEq/L — ABNORMAL LOW (ref 137–147)

## 2014-02-02 LAB — CULTURE, BLOOD (ROUTINE X 2)
Culture: NO GROWTH
Culture: NO GROWTH

## 2014-02-02 LAB — CBC
HCT: 29.9 % — ABNORMAL LOW (ref 36.0–46.0)
Hemoglobin: 10.1 g/dL — ABNORMAL LOW (ref 12.0–15.0)
MCH: 30.8 pg (ref 26.0–34.0)
MCHC: 33.8 g/dL (ref 30.0–36.0)
MCV: 91.2 fL (ref 78.0–100.0)
PLATELETS: 244 10*3/uL (ref 150–400)
RBC: 3.28 MIL/uL — ABNORMAL LOW (ref 3.87–5.11)
RDW: 13.9 % (ref 11.5–15.5)
WBC: 11.7 10*3/uL — AB (ref 4.0–10.5)

## 2014-02-02 LAB — GLUCOSE, CAPILLARY
GLUCOSE-CAPILLARY: 170 mg/dL — AB (ref 70–99)
GLUCOSE-CAPILLARY: 175 mg/dL — AB (ref 70–99)
Glucose-Capillary: 156 mg/dL — ABNORMAL HIGH (ref 70–99)
Glucose-Capillary: 147 mg/dL — ABNORMAL HIGH (ref 70–99)

## 2014-02-02 LAB — VANCOMYCIN, TROUGH: Vancomycin Tr: 16.1 ug/mL (ref 10.0–20.0)

## 2014-02-02 LAB — VANCOMYCIN, RANDOM: VANCOMYCIN RM: 14 ug/mL

## 2014-02-02 MED ORDER — ONDANSETRON HCL 4 MG/2ML IJ SOLN
INTRAMUSCULAR | Status: AC
  Filled 2014-02-02: qty 2

## 2014-02-02 MED ORDER — SODIUM CHLORIDE 0.9 % IV BOLUS (SEPSIS)
250.0000 mL | Freq: Once | INTRAVENOUS | Status: AC
  Administered 2014-02-02: 250 mL via INTRAVENOUS

## 2014-02-02 MED ORDER — DOXERCALCIFEROL 4 MCG/2ML IV SOLN
INTRAVENOUS | Status: AC
  Filled 2014-02-02: qty 2

## 2014-02-02 MED ORDER — OXYCODONE HCL ER 15 MG PO T12A
15.0000 mg | EXTENDED_RELEASE_TABLET | Freq: Two times a day (BID) | ORAL | Status: DC
  Administered 2014-02-03 – 2014-02-05 (×5): 15 mg via ORAL
  Filled 2014-02-02 (×6): qty 1

## 2014-02-02 NOTE — Progress Notes (Signed)
  Date: 02/02/2014  Patient name: Brandi Arellano  Medical record number: [id number]  Date of birth: 01-23-1956   This patient has been seen and the plan of care was discussed with the house staff. Please see their note for complete details. I concur with their findings with the following additions/corrections:  Seen in HD, complaining of nausea. Some tenderness at surgical site.  Otherwise, she is more alert than I have seen her and did not have any questions.  She currently has promethazine and zofran ordered.  Could consider increasing dose of promethazine as she continues to have nausea.   Sid Falcon, MD 02/02/2014, 10:20 AM

## 2014-02-02 NOTE — Progress Notes (Signed)
Patient returned to unit from hemodialysis complaining of pain in right leg and shortness of breath.  BP 62/48 manually.  02 sats 98% on ra.  MD notified.  Will continue to monitor.

## 2014-02-02 NOTE — Progress Notes (Signed)
Subjective: Seen at dialysis> patient complaining of pain at right BKA, no other complaints.  Called back to patient's room by RN after dialysis due to hypotension, manual BP 62/48.  Ordered 250cc bolus, patient more drowsy than earlier. Objective: Vital signs in last 24 hours: Filed Vitals:   02/02/14 0655 02/02/14 0705 02/02/14 0710 02/02/14 0730  BP: 90/57 139/90 107/72 121/75  Pulse: 98 93 91 91  Temp: 98.2 F (36.8 C)     TempSrc: Oral     Resp: 21 17 19 19   Height:      Weight: 114 lb 13.8 oz (52.1 kg)     SpO2: 100%      Weight change: -1 lb 5.2 oz (-0.6 kg)  Intake/Output Summary (Last 24 hours) at 02/02/14 0813 Last data filed at 02/01/14 1458  Gross per 24 hour  Intake    100 ml  Output      0 ml  Net    100 ml   General: resting in bed HEENT: EOMI, no scleral icterus Cardiac: RRR, no rubs, murmurs or gallops Pulm: CTAB Abd: soft, nontender, nondistended, BS present Ext: right BKA, bandage C/d/i   Lab Results: Basic Metabolic Panel:  Recent Labs Lab 01/31/14 0647 02/01/14 0438 02/02/14 0723  NA 135* 132* 134*  K 5.0 4.1 4.0  CL 89* 88* 89*  CO2 20 22 22   GLUCOSE 208* 149* 204*  BUN 58* 28* 53*  CREATININE 7.93* 5.03* 8.18*  CALCIUM 9.2 9.4 9.1  PHOS 10.1*  --  10.6*   CBG:  Recent Labs Lab 01/31/14 2052 02/01/14 0746 02/01/14 1141 02/01/14 1509 02/01/14 1712 02/01/14 2053  GLUCAP 138* 182* 261* 204* 234* 246*    Micro Results: Recent Results (from the past 240 hour(s))  CULTURE, BLOOD (ROUTINE X 2)     Status: None   Collection Time    01/27/14  5:05 PM      Result Value Ref Range Status   Specimen Description BLOOD LEFT FOREARM   Final   Special Requests BOTTLES DRAWN AEROBIC ONLY 4CC   Final   Culture  Setup Time     Final   Value: 01/27/2014 22:34     Performed at [facility]   Culture     Final   Value:        BLOOD CULTURE RECEIVED NO GROWTH TO DATE CULTURE WILL BE HELD FOR 5 DAYS BEFORE ISSUING A FINAL NEGATIVE  REPORT     Performed at [facility]   Report Status PENDING   Incomplete  CULTURE, BLOOD (ROUTINE X 2)     Status: None   Collection Time    01/27/14  5:22 PM      Result Value Ref Range Status   Specimen Description BLOOD RIGHT HAND   Final   Special Requests BOTTLES DRAWN AEROBIC AND ANAEROBIC 5CC EA   Final   Culture  Setup Time     Final   Value: 01/27/2014 22:33     Performed at [facility]   Culture     Final   Value:        BLOOD CULTURE RECEIVED NO GROWTH TO DATE CULTURE WILL BE HELD FOR 5 DAYS BEFORE ISSUING A FINAL NEGATIVE REPORT     Performed at [facility]   Report Status PENDING   Incomplete  MRSA PCR SCREENING     Status: None   Collection Time    01/28/14  4:24 AM  Result Value Ref Range Status   MRSA by PCR NEGATIVE  NEGATIVE Final   Comment:            The GeneXpert MRSA Assay (FDA     approved for NASAL specimens     only), is one component of a     comprehensive MRSA colonization     surveillance program. It is not     intended to diagnose MRSA     infection nor to guide or     monitor treatment for     MRSA infections.  SURGICAL PCR SCREEN     Status: None   Collection Time    01/31/14  9:52 PM      Result Value Ref Range Status   MRSA, PCR NEGATIVE  NEGATIVE Final   Staphylococcus aureus NEGATIVE  NEGATIVE Final   Comment:            The Xpert SA Assay (FDA     approved for NASAL specimens     in patients over 33 years of age),     is one component of     a comprehensive surveillance     program.  Test performance has     been validated by [employer] for patients greater     than or equal to 34 year old.     It is not intended     to diagnose infection nor to     guide or monitor treatment.   Studies/Results: No results found. Medications: I have reviewed the patient's current medications. Scheduled Meds: . darbepoetin (ARANESP) injection - DIALYSIS  40 mcg Intravenous Q Thu-HD  . doxercalciferol  1 mcg  Intravenous Q T,Th,Sa-HD  . ferric gluconate (FERRLECIT/NULECIT) IV  125 mg Intravenous Q Thu-HD  . insulin aspart  0-9 Units Subcutaneous TID WC  . insulin detemir  9 Units Subcutaneous QHS  . levothyroxine  25 mcg Oral QAC breakfast  . multivitamin  1 tablet Oral QHS  . ondansetron      . OxyCODONE  20 mg Oral Q12H  . piperacillin-tazobactam (ZOSYN)  IV  2.25 g Intravenous 3 times per day  . senna-docusate  1 tablet Oral BID  . sevelamer carbonate  1,600 mg Oral TID WC  . sodium chloride  3 mL Intravenous Q12H  . vancomycin  500 mg Intravenous Q T,Th,Sa-HD   Continuous Infusions:  PRN Meds:.sodium chloride, sodium chloride, feeding supplement (NEPRO CARB STEADY), heparin, hydrALAZINE, lidocaine (PF), lidocaine-prilocaine, ondansetron (ZOFRAN) IV, ondansetron, oxyCODONE, pentafluoroprop-tetrafluoroeth, promethazine, promethazine Assessment/Plan: Principal Problem:   Ischemic ulcer of right foot, limited to breakdown of skin Active Problems:   End stage renal disease   Essential hypertension   DM type 2, uncontrolled, with renal complications   Hypothyroidism  Ms. Herrada is a 58 year old female with DM2, ESRD, HTN, hypothyroidism who is hospitalized for ischemic ulcer of R foot with cellulitis and osteomyelitis likely 2/2 possible Buerger's disease found to have stenosing flexor synovitis of L 3rd digit..   Ischemic ulcer of R foot with cellulitis and osteomyelitis 2/2 possible Buerger's disease:  - POD #1 after right BKA with Dr. Sharol Given -Blood cultures NGTD x 5 days -Continue vancomycin & Zosyn (Abx Day 5) can likely discontinue soon if OK with Ortho -Treat nausea with Phenergan 12.5mg  q6h IV -Will likely need SNF placement at discharge -Pain control: reduce Oxycondone 12hr tablet to 15mg  BID, continue Oxycodone 5mg  IR for breakthrough pain with holding parameters.  Hyponatremia:mild  Increased  Anion Gap: AG 23, bicarb 22.  Likely 2/2 renal disease. Continue following.  ESRD  on HD: Receiving dialysis today. Renal following.  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 -Hydralazine PRN  DM2: CBGs mildly elevated -Continue Levermir 9u QHS -Continue SSI-S.  Hypothyroidism: Continue home meds.  FEN:  -Diet: Renal/Carb Modified  DVT prophylaxis: SCDs  CODE STATUS: FULL CODE  Dispo: Disposition is deferred at this time, awaiting improvement of current medical problems.  Anticipated discharge in approximately 1-2 day(s).   The patient does not have a current PCP (No primary provider on file.) and does not need an [facility] hospital follow-up appointment after discharge.  The patient does have transportation limitations that hinder transportation to clinic appointments.  .Services Needed at time of discharge: Y = Yes, Blank = No PT:   OT:   RN:   Equipment:   Other:     LOS: 6 days   Lucious Groves, DO 02/02/2014, 8:13 AM

## 2014-02-02 NOTE — Progress Notes (Signed)
ANTIBIOTIC CONSULT NOTE - FOLLOW UP  Pharmacy Consult for vancomycin and zosyn Indication: right foot ulcer abscess, osteomyelitis  Allergies  Allergen Reactions  . Diphenhydramine Nausea And Vomiting  . Tramadol Hcl Nausea And Vomiting  . Atenolol Other (See Comments)    unknown  . Iodinated Diagnostic Agents Other (See Comments)    unknown  . Metoprolol Other (See Comments)    unknown  . Morphine And Related Other (See Comments)    unknown  . Thiopental Other (See Comments)    Other     Patient Measurements: Height: 5\' 3"  (160 cm) Weight: 114 lb 13.8 oz (52.1 kg) (Bed) IBW/kg (Calculated) : 52.4   Vital Signs: Temp: 97.5 F (36.4 C) (10/24 1129) Temp Source: Oral (10/24 1129) BP: 74/40 mmHg (10/24 1323) Pulse Rate: 91 (10/24 1129) Intake/Output from previous day: 10/23 0701 - 10/24 0700 In: 100 [I.V.:100] Out: -  Intake/Output from this shift: Total I/O In: -  Out: 1416 [Other:1416]  Labs:  Recent Labs  01/31/14 0647 02/01/14 0438 02/02/14 0722 02/02/14 0723  WBC 9.5  --  11.7*  --   HGB 9.8*  --  10.1*  --   PLT 204  --  244  --   CREATININE 7.93* 5.03*  --  8.18*   Estimated Creatinine Clearance: 6.3 ml/min (by C-G formula based on Cr of 8.18).  Recent Labs  02/02/14 0722  VANCOTROUGH 16.1     Microbiology: Recent Results (from the past 720 hour(s))  CULTURE, BLOOD (ROUTINE X 2)     Status: None   Collection Time    01/27/14  5:05 PM      Result Value Ref Range Status   Specimen Description BLOOD LEFT FOREARM   Final   Special Requests BOTTLES DRAWN AEROBIC ONLY 4CC   Final   Culture  Setup Time     Final   Value: 01/27/2014 22:34     Performed at [facility]   Culture     Final   Value:        BLOOD CULTURE RECEIVED NO GROWTH TO DATE CULTURE WILL BE HELD FOR 5 DAYS BEFORE ISSUING A FINAL NEGATIVE REPORT     Performed at [facility]   Report Status PENDING   Incomplete  CULTURE, BLOOD (ROUTINE X 2)     Status: None    Collection Time    01/27/14  5:22 PM      Result Value Ref Range Status   Specimen Description BLOOD RIGHT HAND   Final   Special Requests BOTTLES DRAWN AEROBIC AND ANAEROBIC 5CC EA   Final   Culture  Setup Time     Final   Value: 01/27/2014 22:33     Performed at [facility]   Culture     Final   Value:        BLOOD CULTURE RECEIVED NO GROWTH TO DATE CULTURE WILL BE HELD FOR 5 DAYS BEFORE ISSUING A FINAL NEGATIVE REPORT     Performed at [facility]   Report Status PENDING   Incomplete  MRSA PCR SCREENING     Status: None   Collection Time    01/28/14  4:24 AM      Result Value Ref Range Status   MRSA by PCR NEGATIVE  NEGATIVE Final   Comment:            The GeneXpert MRSA Assay (FDA     approved for NASAL specimens     only),  is one component of a     comprehensive MRSA colonization     surveillance program. It is not     intended to diagnose MRSA     infection nor to guide or     monitor treatment for     MRSA infections.  SURGICAL PCR SCREEN     Status: None   Collection Time    01/31/14  9:52 PM      Result Value Ref Range Status   MRSA, PCR NEGATIVE  NEGATIVE Final   Staphylococcus aureus NEGATIVE  NEGATIVE Final   Comment:            The Xpert SA Assay (FDA     approved for NASAL specimens     in patients over 41 years of age),     is one component of     a comprehensive surveillance     program.  Test performance has     been validated by [employer] for patients greater     than or equal to 83 year old.     It is not intended     to diagnose infection nor to     guide or monitor treatment.    Anti-infectives   Start     Dose/Rate Route Frequency Ordered Stop   02/01/14 0600  ceFAZolin (ANCEF) IVPB 2 g/50 mL premix     2 g 100 mL/hr over 30 Minutes Intravenous On call to O.R. 01/31/14 1414 02/01/14 1430   01/29/14 1200  vancomycin (VANCOCIN) 500 mg in sodium chloride 0.9 % 100 mL IVPB     500 mg 100 mL/hr over 60 Minutes  Intravenous Every T-Th-Sa (Hemodialysis) 01/27/14 1623     01/27/14 1900  piperacillin-tazobactam (ZOSYN) IVPB 2.25 g     2.25 g 100 mL/hr over 30 Minutes Intravenous 3 times per day 01/27/14 1832     01/27/14 1630  vancomycin (VANCOCIN) 1,250 mg in sodium chloride 0.9 % 250 mL IVPB     1,250 mg 166.7 mL/hr over 90 Minutes Intravenous  Once 01/27/14 1623 01/27/14 2013      Assessment: Patient is a 58 y.o F with ESRD on HD currently on vancomycin and zosyn for abscess and osteomyelitis of fifth metatarsal.  Pt is now s/p amputation. All cultures have been neg so far. Hopefully abx can be stopped soon. Vanc preHD level came back ok this AM.   Vanc 10/19> Zosyn 10/19>  10/18 bcx x2>> ngtd   Goal of Therapy:  Pre-HD vacomycin level 15-25  Plan:   cont Vancomycin 500 mg post HD-T/Th/Sat  cont Zosyn 2.25 q8h Consider stopping abx if appropriate  Onnie Boer, PharmD Pager: [phone] 02/02/2014 1:35 PM

## 2014-02-02 NOTE — Progress Notes (Signed)
Subjective:   Still having some nausea but better than yesterday. Ache in leg. Tolerating HD  Objective Filed Vitals:   02/02/14 0800 02/02/14 0830 02/02/14 0859 02/02/14 0928  BP: 109/68 96/64 108/65 99/61  Pulse: 97 97 94 95  Temp:      TempSrc:      Resp: 16 21 17 25   Height:      Weight:      SpO2:       Physical Exam General: Alert and oriented. No acute distress.  Heart: RRR. 2/6 murmur Lungs: CTA, unlabored  Abdomen: soft, nontender +BS Extremities: R BKA with post op dressing. L healed transmet Dialysis Access:  R IJ cath  Dialysis Orders: [place] [facility] 4 hr 160 Optiflux 400/600 EDW 55 2K 2.25 Ca right I-J profile 4  Aranesp 40 last given 10/15  Hectorol 1   Assessment/Plan:  1. Nonhealing right lateral foot ulcer with osteomyelitis/cellulitis with prob Buerger's dz- - hx severe PVD/DM- on Vanc and Zosyn/ BKA 10/23 2. ESRD - TTS - Has been seen in the past for permanent vascular access, but no plan yet - poor options; K 4- reassess edw post surgery-  3 kgs under edw pre HD today 3. Hypertension/volume - 99/61 BP variable. No volume excess- 4. Anemia - Hgb 10.1 - stable - Aranesp dosed 40 last week . 10/22; Fe 54 tsat 26% ferritin 429 -weekly IV Fe start 10/22 5. Metabolic bone disease - Continue Hectorol and renvela -^ dose to 2 ac - has been on 1 ac P ^ up to 10. -resume binders when diet advanced 6. Nutrition - Renal/carb motified diet. multivit Alb 2.7 7. Hypothyroidism - on synthroid  8. DM - per primary. on SSI at home - need to update outpt med list at d/c 9. Pain/ nausea - per Rennis Golden, NP [place] [phone] 02/02/2014,9:30 AM  LOS: 6 days   Pt seen, examined and agree w A/P as above.  [name] MD pager [phone]    cell [phone] 02/02/2014, 11:04 AM    Additional Objective Labs: Basic Metabolic Panel:  Recent Labs Lab 01/30/14 0530 01/31/14 0647 02/01/14 0438 02/02/14 0723  NA 140 135* 132* 134*  K  4.9 5.0 4.1 4.0  CL 94* 89* 88* 89*  CO2 22 20 22 22   GLUCOSE 244* 208* 149* 204*  BUN 40* 58* 28* 53*  CREATININE 5.67* 7.93* 5.03* 8.18*  CALCIUM 9.1 9.2 9.4 9.1  PHOS 9.8* 10.1*  --  10.6*   Liver Function Tests:  Recent Labs Lab 01/30/14 0530 01/31/14 0647 02/02/14 0723  ALBUMIN 2.9* 2.7* 2.9*   No results found for this basename: LIPASE, AMYLASE,  in the last 168 hours CBC:  Recent Labs Lab 01/28/14 0533 01/29/14 0720 01/30/14 0530 01/31/14 0647 02/02/14 0722  WBC 6.0 8.9 7.9 9.5 11.7*  HGB 9.6* 9.5* 9.9* 9.8* 10.1*  HCT 29.0* 29.5* 30.1* 29.8* 29.9*  MCV 91.8 93.4 95.6 94.3 91.2  PLT 126* 150 167 204 244   Blood Culture    Component Value Date/Time   SDES BLOOD RIGHT HAND 01/27/2014 1722   SPECREQUEST BOTTLES DRAWN AEROBIC AND ANAEROBIC 5CC EA 01/27/2014 1722   CULT  Value:        BLOOD CULTURE RECEIVED NO GROWTH TO DATE CULTURE WILL BE HELD FOR 5 DAYS BEFORE ISSUING A FINAL NEGATIVE REPORT Performed at [facility] 01/27/2014 1722   REPTSTATUS PENDING 01/27/2014 1722    Cardiac Enzymes: No results found  for this basename: CKTOTAL, CKMB, CKMBINDEX, TROPONINI,  in the last 168 hours CBG:  Recent Labs Lab 02/01/14 0746 02/01/14 1141 02/01/14 1509 02/01/14 1712 02/01/14 2053  GLUCAP 182* 261* 204* 234* 246*   Iron Studies: No results found for this basename: IRON, TIBC, TRANSFERRIN, FERRITIN,  in the last 72 hours @lablastinr3 @ Studies/Results: No results found. Medications:   . darbepoetin (ARANESP) injection - DIALYSIS  40 mcg Intravenous Q Thu-HD  . doxercalciferol      . doxercalciferol  1 mcg Intravenous Q T,Th,Sa-HD  . ferric gluconate (FERRLECIT/NULECIT) IV  125 mg Intravenous Q Thu-HD  . insulin aspart  0-9 Units Subcutaneous TID WC  . insulin detemir  9 Units Subcutaneous QHS  . levothyroxine  25 mcg Oral QAC breakfast  . multivitamin  1 tablet Oral QHS  . OxyCODONE  20 mg Oral Q12H  . piperacillin-tazobactam (ZOSYN)  IV  2.25  g Intravenous 3 times per day  . senna-docusate  1 tablet Oral BID  . sevelamer carbonate  1,600 mg Oral TID WC  . sodium chloride  3 mL Intravenous Q12H  . vancomycin  500 mg Intravenous Q T,Th,Sa-HD

## 2014-02-02 NOTE — Progress Notes (Signed)
Patient ID: Brandi Arellano, female   DOB: 09-09-1955, 58 y.o.   MRN: [id number] Postoperative day 1 status post transtibial amputation. Patient states he still feels nauseated. The same nausea she had preoperatively. Plan for physical therapy progressive ambulation. Anticipate patient will need skilled nursing care at discharge.

## 2014-02-03 DIAGNOSIS — R110 Nausea: Secondary | ICD-10-CM

## 2014-02-03 LAB — RENAL FUNCTION PANEL
ALBUMIN: 2.6 g/dL — AB (ref 3.5–5.2)
Anion gap: 25 — ABNORMAL HIGH (ref 5–15)
BUN: 38 mg/dL — AB (ref 6–23)
CALCIUM: 8.6 mg/dL (ref 8.4–10.5)
CO2: 18 mEq/L — ABNORMAL LOW (ref 19–32)
CREATININE: 5.79 mg/dL — AB (ref 0.50–1.10)
Chloride: 96 mEq/L (ref 96–112)
GFR calc Af Amer: 9 mL/min — ABNORMAL LOW (ref >90)
GFR, EST NON AFRICAN AMERICAN: 7 mL/min — AB (ref >90)
Glucose, Bld: 110 mg/dL — ABNORMAL HIGH (ref 70–99)
PHOSPHORUS: 9 mg/dL — AB (ref 2.3–4.6)
Potassium: 4.1 mEq/L (ref 3.7–5.3)
Sodium: 139 mEq/L (ref 137–147)

## 2014-02-03 LAB — GLUCOSE, CAPILLARY
GLUCOSE-CAPILLARY: 141 mg/dL — AB (ref 70–99)
GLUCOSE-CAPILLARY: 122 mg/dL — AB (ref 70–99)
Glucose-Capillary: 116 mg/dL — ABNORMAL HIGH (ref 70–99)
Glucose-Capillary: 167 mg/dL — ABNORMAL HIGH (ref 70–99)

## 2014-02-03 MED ORDER — METOCLOPRAMIDE HCL 5 MG PO TABS
5.0000 mg | ORAL_TABLET | Freq: Three times a day (TID) | ORAL | Status: DC
  Administered 2014-02-03 – 2014-02-14 (×29): 5 mg via ORAL
  Filled 2014-02-03 (×36): qty 1

## 2014-02-03 NOTE — Progress Notes (Signed)
  Date: 02/03/2014  Patient name: Brandi Arellano  Medical record number: [id number]  Date of birth: 05-05-1955   This patient has been seen and the plan of care was discussed with the house staff. Please see their note for complete details. I concur with their findings with the following additions/corrections:  Update from Dr. Jodene Nam note, no HD today, will have HD on Tuesday.  Agree with trial of reglan given persistent symptoms and uncontrolled with normal medications.  If improves, can continue for 2 weeks and re-evaluate. PT/OT for evaluation post op.   Sid Falcon, MD 02/03/2014, 11:11 AM

## 2014-02-03 NOTE — Progress Notes (Signed)
PT Cancellation Note  Patient Details Name: Brandi Arellano MRN: [id number] DOB: 1955/09/19   Cancelled Treatment:    Reason Eval/Treat Not Completed: Pain limiting ability to participate;Fatigue/lethargy limiting ability to participate.  Patient declined PT today reporting pain, nausea and fatigue.  Will return in am for PT evaluation.   Despina Pole 02/03/2014, 11:29 AM Carita Pian. Sanjuana Kava, [place] Pager [phone]

## 2014-02-03 NOTE — Progress Notes (Signed)
Subjective: BP improved overnight after multiple small boluses. Patient more alert today but still complaining of nausea.  Does not feel that she can eat solid foods for a possible gastric emptying study. Objective: Vital signs in last 24 hours: Filed Vitals:   02/02/14 2323 02/03/14 0510 02/03/14 0900 02/03/14 1000  BP: 103/57 111/65 151/94 107/62  Pulse: 90 102 92 96  Temp:  98.9 F (37.2 C) 97.5 F (36.4 C) 99.3 F (37.4 C)  TempSrc:  Oral Oral Oral  Resp:  16 21 18   Height:      Weight:      SpO2:  100% 100% 98%   Weight change: -7 lb 4.4 oz (-3.299 kg)  Intake/Output Summary (Last 24 hours) at 02/03/14 1034 Last data filed at 02/03/14 0600  Gross per 24 hour  Intake    370 ml  Output      0 ml  Net    370 ml   General: resting in bed HEENT: EOMI, no scleral icterus Cardiac: RRR, no rubs, murmurs or gallops Pulm: CTAB Abd: soft, nontender, nondistended, BS present Ext: right BKA, bandage C/d/i   Lab Results: Basic Metabolic Panel:  Recent Labs Lab 02/02/14 0723 02/03/14 0411  NA 134* 139  K 4.0 4.1  CL 89* 96  CO2 22 18*  GLUCOSE 204* 110*  BUN 53* 38*  CREATININE 8.18* 5.79*  CALCIUM 9.1 8.6  PHOS 10.6* 9.0*   CBG:  Recent Labs Lab 02/01/14 2053 02/02/14 1139 02/02/14 1540 02/02/14 1652 02/02/14 2043 02/03/14 0744  GLUCAP 246* 170* 175* 156* 147* 116*    Micro Results: Recent Results (from the past 240 hour(s))  CULTURE, BLOOD (ROUTINE X 2)     Status: None   Collection Time    01/27/14  5:05 PM      Result Value Ref Range Status   Specimen Description BLOOD LEFT FOREARM   Final   Special Requests BOTTLES DRAWN AEROBIC ONLY 4CC   Final   Culture  Setup Time     Final   Value: 01/27/2014 22:34     Performed at [facility]   Culture     Final   Value: NO GROWTH 5 DAYS     Performed at [facility]   Report Status 02/02/2014 FINAL   Final  CULTURE, BLOOD (ROUTINE X 2)     Status: None   Collection Time   01/27/14  5:22 PM      Result Value Ref Range Status   Specimen Description BLOOD RIGHT HAND   Final   Special Requests BOTTLES DRAWN AEROBIC AND ANAEROBIC 5CC EA   Final   Culture  Setup Time     Final   Value: 01/27/2014 22:33     Performed at [facility]   Culture     Final   Value: NO GROWTH 5 DAYS     Performed at [facility]   Report Status 02/02/2014 FINAL   Final  MRSA PCR SCREENING     Status: None   Collection Time    01/28/14  4:24 AM      Result Value Ref Range Status   MRSA by PCR NEGATIVE  NEGATIVE Final   Comment:            The GeneXpert MRSA Assay (FDA     approved for NASAL specimens     only), is one component of a     comprehensive MRSA colonization     surveillance  program. It is not     intended to diagnose MRSA     infection nor to guide or     monitor treatment for     MRSA infections.  SURGICAL PCR SCREEN     Status: None   Collection Time    01/31/14  9:52 PM      Result Value Ref Range Status   MRSA, PCR NEGATIVE  NEGATIVE Final   Staphylococcus aureus NEGATIVE  NEGATIVE Final   Comment:            The Xpert SA Assay (FDA     approved for NASAL specimens     in patients over 45 years of age),     is one component of     a comprehensive surveillance     program.  Test performance has     been validated by [employer] for patients greater     than or equal to 69 year old.     It is not intended     to diagnose infection nor to     guide or monitor treatment.   Studies/Results: No results found. Medications: I have reviewed the patient's current medications. Scheduled Meds: . darbepoetin (ARANESP) injection - DIALYSIS  40 mcg Intravenous Q Thu-HD  . doxercalciferol  1 mcg Intravenous Q T,Th,Sa-HD  . ferric gluconate (FERRLECIT/NULECIT) IV  125 mg Intravenous Q Thu-HD  . insulin aspart  0-9 Units Subcutaneous TID WC  . insulin detemir  9 Units Subcutaneous QHS  . levothyroxine  25 mcg Oral QAC breakfast  .  metoCLOPramide  5 mg Oral TID AC  . multivitamin  1 tablet Oral QHS  . OxyCODONE  15 mg Oral Q12H  . piperacillin-tazobactam (ZOSYN)  IV  2.25 g Intravenous 3 times per day  . senna-docusate  1 tablet Oral BID  . sevelamer carbonate  1,600 mg Oral TID WC  . sodium chloride  3 mL Intravenous Q12H  . vancomycin  500 mg Intravenous Q T,Th,Sa-HD   Continuous Infusions:  PRN Meds:.sodium chloride, sodium chloride, feeding supplement (NEPRO CARB STEADY), heparin, hydrALAZINE, lidocaine (PF), lidocaine-prilocaine, oxyCODONE, pentafluoroprop-tetrafluoroeth, promethazine, promethazine Assessment/Plan: Principal Problem:   Ischemic ulcer of right foot, limited to breakdown of skin Active Problems:   End stage renal disease   Essential hypertension   DM type 2, uncontrolled, with renal complications   Hypothyroidism  Brandi Arellano is a 58 year old female with DM2, ESRD, HTN, hypothyroidism who is hospitalized for ischemic ulcer of R foot with cellulitis and osteomyelitis likely 2/2 possible Buerger's disease found to have stenosing flexor synovitis of L 3rd digit..   Nausea - Patient continues to have uncontrolled nausea despite phenergan and zofran.  Given her history of diabetes I am concerned for possible gastroparesis as the cause but she cannot complete a gastric emptying study at this time. -Will D/C Zofran - Start empiric trial of  reglan 5mg  TID with meals.  Ischemic ulcer of R foot with cellulitis and osteomyelitis 2/2 possible Buerger's disease:  - POD #2 after right BKA with Dr. Sharol Given -Blood cultures NGTD x 5 days -Continue vancomycin & Zosyn (Abx Day 8) will d/c with OK with Ortho. -Will likely need SNF placement at discharge PT and OT eval today -Pain control: Oxycondone 12hr tablet to 15mg  BID, continue Oxycodone 5mg  IR for breakthrough pain with holding parameters.  Hyponatremia:mild  Increased Anion Gap: AG 25, bicarb 18.  Likely 2/2 renal disease. Continue following.  ESRD  on  HD: Receiving dialysis today. Renal following.  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 -Hydralazine PRN  DM2: CBGs well controlled -Continue Levermir 9u QHS -Continue SSI-S.  Hypothyroidism: Continue home meds.  FEN:  -Diet: Renal/Carb Modified  DVT prophylaxis: SCDs  CODE STATUS: FULL CODE  Dispo: Disposition is deferred at this time, awaiting improvement of current medical problems.  Need improvement of nausea and eval by PT and OT.  The patient does not have a current PCP (No primary provider on file.) and does not need an [facility] hospital follow-up appointment after discharge.  The patient does have transportation limitations that hinder transportation to clinic appointments.  .Services Needed at time of discharge: Y = Yes, Blank = No PT:   OT:   RN:   Equipment:   Other:     LOS: 7 days   Lucious Groves, DO 02/03/2014, 10:34 AM

## 2014-02-03 NOTE — Progress Notes (Signed)
Subjective:   States pain well controlled but still having nausea, not able to eat much  Objective Filed Vitals:   02/02/14 2323 02/03/14 0510 02/03/14 0900 02/03/14 1000  BP: 103/57 111/65 151/94 107/62  Pulse: 90 102 92 96  Temp:  98.9 F (37.2 C) 97.5 F (36.4 C) 99.3 F (37.4 C)  TempSrc:  Oral Oral Oral  Resp:  16 21 18   Height:      Weight:      SpO2:  100% 100% 98%   Physical Exam  General: Alert and oriented. No acute distress.  Heart: RRR. 2/6 murmur  Lungs: CTA, unlabored  Abdomen: soft, nontender +BS  Extremities: R BKA with post op dressing. L healed transmet  Dialysis Access: R IJ cath  Dialysis Orders: [facility] TTS 4 hr 160 Optiflux 400/600 EDW 55 2K 2.25 Ca right I-J profile 4  Aranesp 40 last given 10/15  Hectorol 1   Assessment/Plan:  1. 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 ESRD - TTS - Has been seen in the past for permanent vascular access, but no plan yet - poor options; K 4.1- reassess edw post surgery-close to 52kgs now. Next HD Tuesday. 3. Hypertension/volume - 107/62 BP variable. No volume excess 4. Anemia - Hgb 10.1 - stable - Aranesp dosed 40 last week . 10/22; Fe 54 tsat 26% ferritin 429 -weekly IV Fe start 10/22 5. Metabolic bone disease - Continue Hectorol and renvela -^ dose to 2 ac - has been on 1 ac P ^ up to 10. -resume binders when diet advanced 6. Nutrition - Renal/carb motified diet. multivit Alb 2.6 7. Hypothyroidism - on synthroid  8. DM - per primary. on SSI at home - need to update outpt med list at d/c 9. Pain/ nausea - per primary, concern for gastroparesis. Starting reglan 10. Dispo- PT/OT.  will likely need SNF   Shelle Iron, NP [place] [phone] 02/03/2014,10:56 AM  LOS: 7 days   Pt seen, examined, agree w assess/plan as above with additions as indicated. Stable POD #2, more alert. No new issues. [facility] has seen the patient in Septmeber for perm access and did bilat LE  arteriogram and UE venograms.  Not sure what possibilities there are for perm access per their notes. Would ask them tomorrow to reassess for perm access while she is here.  [name] MD pager [phone]    cell [phone] 02/03/2014, 12:53 PM     Additional Objective Labs: Basic Metabolic Panel:  Recent Labs Lab 01/31/14 0647 02/01/14 0438 02/02/14 0723 02/03/14 0411  NA 135* 132* 134* 139  K 5.0 4.1 4.0 4.1  CL 89* 88* 89* 96  CO2 20 22 22  18*  GLUCOSE 208* 149* 204* 110*  BUN 58* 28* 53* 38*  CREATININE 7.93* 5.03* 8.18* 5.79*  CALCIUM 9.2 9.4 9.1 8.6  PHOS 10.1*  --  10.6* 9.0*   Liver Function Tests:  Recent Labs Lab 01/31/14 0647 02/02/14 0723 02/03/14 0411  ALBUMIN 2.7* 2.9* 2.6*   No results found for this basename: LIPASE, AMYLASE,  in the last 168 hours CBC:  Recent Labs Lab 01/28/14 0533 01/29/14 0720 01/30/14 0530 01/31/14 0647 02/02/14 0722  WBC 6.0 8.9 7.9 9.5 11.7*  HGB 9.6* 9.5* 9.9* 9.8* 10.1*  HCT 29.0* 29.5* 30.1* 29.8* 29.9*  MCV 91.8 93.4 95.6 94.3 91.2  PLT 126* 150 167 204 244   Blood Culture    Component Value Date/Time   SDES  BLOOD RIGHT HAND 01/27/2014 1722   SPECREQUEST BOTTLES DRAWN AEROBIC AND ANAEROBIC 5CC EA 01/27/2014 1722   CULT  Value: NO GROWTH 5 DAYS Performed at [facility] 01/27/2014 1722   REPTSTATUS 02/02/2014 FINAL 01/27/2014 1722    Cardiac Enzymes: No results found for this basename: CKTOTAL, CKMB, CKMBINDEX, TROPONINI,  in the last 168 hours CBG:  Recent Labs Lab 02/02/14 1139 02/02/14 1540 02/02/14 1652 02/02/14 2043 02/03/14 0744  GLUCAP 170* 175* 156* 147* 116*   Iron Studies: No results found for this basename: IRON, TIBC, TRANSFERRIN, FERRITIN,  in the last 72 hours @lablastinr3 @ Studies/Results: No results found. Medications:   . darbepoetin (ARANESP) injection - DIALYSIS  40 mcg Intravenous Q Thu-HD  . doxercalciferol  1 mcg Intravenous Q T,Th,Sa-HD  . ferric gluconate  (FERRLECIT/NULECIT) IV  125 mg Intravenous Q Thu-HD  . insulin aspart  0-9 Units Subcutaneous TID WC  . insulin detemir  9 Units Subcutaneous QHS  . levothyroxine  25 mcg Oral QAC breakfast  . metoCLOPramide  5 mg Oral TID AC  . multivitamin  1 tablet Oral QHS  . OxyCODONE  15 mg Oral Q12H  . piperacillin-tazobactam (ZOSYN)  IV  2.25 g Intravenous 3 times per day  . senna-docusate  1 tablet Oral BID  . sevelamer carbonate  1,600 mg Oral TID WC  . sodium chloride  3 mL Intravenous Q12H  . vancomycin  500 mg Intravenous Q T,Th,Sa-HD

## 2014-02-04 LAB — BLOOD GAS, VENOUS
Acid-base deficit: 7.5 mmol/L — ABNORMAL HIGH (ref 0.0–2.0)
Bicarbonate: 18.6 mEq/L — ABNORMAL LOW (ref 20.0–24.0)
O2 Saturation: 98.7 %
PCO2 VEN: 44.5 mmHg — AB (ref 45.0–50.0)
Patient temperature: 98.6
TCO2: 19.9 mmol/L (ref 0–100)
pH, Ven: 7.244 — ABNORMAL LOW (ref 7.250–7.300)
pO2, Ven: 182 mmHg — ABNORMAL HIGH (ref 30.0–45.0)

## 2014-02-04 LAB — RENAL FUNCTION PANEL
ALBUMIN: 2.6 g/dL — AB (ref 3.5–5.2)
ANION GAP: 27 — AB (ref 5–15)
BUN: 59 mg/dL — AB (ref 6–23)
CHLORIDE: 95 meq/L — AB (ref 96–112)
CO2: 16 mEq/L — ABNORMAL LOW (ref 19–32)
Calcium: 8.5 mg/dL (ref 8.4–10.5)
Creatinine, Ser: 8.37 mg/dL — ABNORMAL HIGH (ref 0.50–1.10)
GFR calc non Af Amer: 5 mL/min — ABNORMAL LOW (ref >90)
GFR, EST AFRICAN AMERICAN: 5 mL/min — AB (ref >90)
Glucose, Bld: 116 mg/dL — ABNORMAL HIGH (ref 70–99)
PHOSPHORUS: 10.4 mg/dL — AB (ref 2.3–4.6)
POTASSIUM: 4.1 meq/L (ref 3.7–5.3)
SODIUM: 138 meq/L (ref 137–147)

## 2014-02-04 LAB — GLUCOSE, CAPILLARY
GLUCOSE-CAPILLARY: 110 mg/dL — AB (ref 70–99)
GLUCOSE-CAPILLARY: 104 mg/dL — AB (ref 70–99)
Glucose-Capillary: 164 mg/dL — ABNORMAL HIGH (ref 70–99)
Glucose-Capillary: 99 mg/dL (ref 70–99)

## 2014-02-04 LAB — CBC
HCT: 27.4 % — ABNORMAL LOW (ref 36.0–46.0)
HEMOGLOBIN: 8.9 g/dL — AB (ref 12.0–15.0)
MCH: 31.1 pg (ref 26.0–34.0)
MCHC: 32.5 g/dL (ref 30.0–36.0)
MCV: 95.8 fL (ref 78.0–100.0)
Platelets: 251 10*3/uL (ref 150–400)
RBC: 2.86 MIL/uL — ABNORMAL LOW (ref 3.87–5.11)
RDW: 14.3 % (ref 11.5–15.5)
WBC: 11.6 10*3/uL — ABNORMAL HIGH (ref 4.0–10.5)

## 2014-02-04 LAB — LACTIC ACID, PLASMA: Lactic Acid, Venous: 0.7 mmol/L (ref 0.5–2.2)

## 2014-02-04 MED ORDER — CINACALCET HCL 30 MG PO TABS
30.0000 mg | ORAL_TABLET | Freq: Every day | ORAL | Status: DC
  Administered 2014-02-04 – 2014-02-14 (×9): 30 mg via ORAL
  Filled 2014-02-04 (×13): qty 1

## 2014-02-04 MED ORDER — PANTOPRAZOLE SODIUM 40 MG IV SOLR
40.0000 mg | Freq: Two times a day (BID) | INTRAVENOUS | Status: DC
  Administered 2014-02-04 – 2014-02-05 (×4): 40 mg via INTRAVENOUS
  Filled 2014-02-04 (×6): qty 40

## 2014-02-04 MED ORDER — DARBEPOETIN ALFA-POLYSORBATE 60 MCG/0.3ML IJ SOLN
60.0000 ug | INTRAMUSCULAR | Status: DC
  Administered 2014-02-07: 60 ug via INTRAVENOUS
  Filled 2014-02-04: qty 0.3

## 2014-02-04 MED ORDER — PANTOPRAZOLE SODIUM 40 MG PO TBEC: 40.0000 mg | DELAYED_RELEASE_TABLET | Freq: Every day | ORAL | Status: DC

## 2014-02-04 MED ORDER — PANTOPRAZOLE SODIUM 40 MG IV SOLR: 40.0000 mg | INTRAVENOUS | Status: DC

## 2014-02-04 MED ORDER — SEVELAMER CARBONATE 0.8 G PO PACK
0.8000 g | PACK | Freq: Three times a day (TID) | ORAL | Status: DC
  Filled 2014-02-04 (×3): qty 1

## 2014-02-04 MED ORDER — PANTOPRAZOLE SODIUM 40 MG PO TBEC: 40.0000 mg | DELAYED_RELEASE_TABLET | Freq: Two times a day (BID) | ORAL | Status: DC

## 2014-02-04 MED ORDER — BOOST / RESOURCE BREEZE PO LIQD
1.0000 | Freq: Two times a day (BID) | ORAL | Status: DC
  Administered 2014-02-08 (×2): 1 via ORAL

## 2014-02-04 NOTE — Progress Notes (Signed)
Subjective: Patient laying in bed complaining of nausea and vomiting, unable to eat breakfast. Pain is well controlled.  Objective: Vital signs in last 24 hours: Filed Vitals:   02/03/14 1700 02/03/14 2024 02/04/14 0556 02/04/14 0956  BP: 102/51 126/81 125/68 135/66  Pulse: 87 92 90 91  Temp: 99 F (37.2 C) 98.6 F (37 C) 98.2 F (36.8 C) 98.8 F (37.1 C)  TempSrc: Oral Oral Oral Oral  Resp: 21 20 20 16   Height:      Weight:  52.101 kg (114 lb 13.8 oz)    SpO2: 96% 99% 98% 99%   Weight change: 0 kg (0 lb)  Intake/Output Summary (Last 24 hours) at 02/04/14 1129 Last data filed at 02/04/14 1018  Gross per 24 hour  Intake    420 ml  Output      0 ml  Net    420 ml   Physical Exam: Neuro: Pain well controlled, AAOx3 CV: RRR, systolic murmur Pulm: CTAB, normal work of breathing Abd: Tender to palpation, no rebound or guarding Ext: Right lower extremity s/p BKA and dressed, left lower extremity warm and well perfused without edema, bilateral upper extremities warm and well perfused with 3+ pulses  Lab Results:  Ref. Range 02/04/2014 05:50  Sodium Latest Range: 137-147 mEq/L 138  Potassium Latest Range: 3.7-5.3 mEq/L 4.1  Chloride Latest Range: 96-112 mEq/L 95 (L)  CO2 Latest Range: 19-32 mEq/L 16 (L)  BUN Latest Range: 6-23 mg/dL 59 (H)  Creatinine Latest Range: 0.50-1.10 mg/dL 8.37 (H)  Calcium Latest Range: 8.4-10.5 mg/dL 8.5  GFR calc non Af Amer Latest Range: >90 mL/min 5 (L)  GFR calc Af Amer Latest Range: >90 mL/min 5 (L)  Glucose Latest Range: 70-99 mg/dL 116 (H)  Anion gap Latest Range: 5-15  27 (H)  Phosphorus Latest Range: 2.3-4.6 mg/dL 10.4 (H)  Albumin Latest Range: 3.5-5.2 g/dL 2.6 (L)  WBC Latest Range: 4.0-10.5 K/uL 11.6 (H)  RBC Latest Range: 3.87-5.11 MIL/uL 2.86 (L)  Hemoglobin Latest Range: 12.0-15.0 g/dL 8.9 (L)  HCT Latest Range: 36.0-46.0 % 27.4 (L)  MCV Latest Range: 78.0-100.0 fL 95.8  MCH Latest Range: 26.0-34.0 pg 31.1  MCHC Latest Range:  30.0-36.0 g/dL 32.5  RDW Latest Range: 11.5-15.5 % 14.3  Platelets Latest Range: 150-400 K/uL 251   Micro Results: Recent Results (from the past 240 hour(s))  CULTURE, BLOOD (ROUTINE X 2)     Status: None   Collection Time    01/27/14  5:05 PM      Result Value Ref Range Status   Specimen Description BLOOD LEFT FOREARM   Final   Special Requests BOTTLES DRAWN AEROBIC ONLY 4CC   Final   Culture  Setup Time     Final   Value: 01/27/2014 22:34     Performed at [place]     Final   Value: NO GROWTH 5 DAYS     Performed at [facility]   Report Status 02/02/2014 FINAL   Final  CULTURE, BLOOD (ROUTINE X 2)     Status: None   Collection Time    01/27/14  5:22 PM      Result Value Ref Range Status   Specimen Description BLOOD RIGHT HAND   Final   Special Requests BOTTLES DRAWN AEROBIC AND ANAEROBIC 5CC EA   Final   Culture  Setup Time     Final   Value: 01/27/2014 22:33     Performed at [facility]  Culture     Final   Value: NO GROWTH 5 DAYS     Performed at [facility]   Report Status 02/02/2014 FINAL   Final  MRSA PCR SCREENING     Status: None   Collection Time    01/28/14  4:24 AM      Result Value Ref Range Status   MRSA by PCR NEGATIVE  NEGATIVE Final   Comment:            The GeneXpert MRSA Assay (FDA     approved for NASAL specimens     only), is one component of a     comprehensive MRSA colonization     surveillance program. It is not     intended to diagnose MRSA     infection nor to guide or     monitor treatment for     MRSA infections.  SURGICAL PCR SCREEN     Status: None   Collection Time    01/31/14  9:52 PM      Result Value Ref Range Status   MRSA, PCR NEGATIVE  NEGATIVE Final   Staphylococcus aureus NEGATIVE  NEGATIVE Final   Comment:            The Xpert SA Assay (FDA     approved for NASAL specimens     in patients over 7 years of age),     is one component of     a comprehensive surveillance      program.  Test performance has     been validated by [employer] for patients greater     than or equal to 81 year old.     It is not intended     to diagnose infection nor to     guide or monitor treatment.   Studies/Results: No results found.  Medications: Scheduled Meds: . [START ON 02/07/2014] darbepoetin (ARANESP) injection - DIALYSIS  60 mcg Intravenous Q Thu-HD  . doxercalciferol  1 mcg Intravenous Q T,Th,Sa-HD  . feeding supplement (RESOURCE BREEZE)  1 Container Oral BID BM  . ferric gluconate (FERRLECIT/NULECIT) IV  125 mg Intravenous Q Thu-HD  . insulin aspart  0-9 Units Subcutaneous TID WC  . insulin detemir  9 Units Subcutaneous QHS  . levothyroxine  25 mcg Oral QAC breakfast  . metoCLOPramide  5 mg Oral TID AC  . multivitamin  1 tablet Oral QHS  . OxyCODONE  15 mg Oral Q12H  . pantoprazole (PROTONIX) IV  40 mg Intravenous Q12H  . senna-docusate  1 tablet Oral BID  . sevelamer carbonate  0.8 g Oral TID WC  . sodium chloride  3 mL Intravenous Q12H   PRN Meds:.sodium chloride, sodium chloride, heparin, hydrALAZINE, lidocaine (PF), lidocaine-prilocaine, oxyCODONE, pentafluoroprop-tetrafluoroeth, promethazine, promethazine  Assessment/Plan: Brandi Arellano is a 58 yo female with a history of DMII, ESRD on HD T/R/S, HTN, hypothyroidism who presents with right foot ischemic ulcer, possible osteomyelitis suspicious for Buerger's disease and incidentally found to have stenosing flexor synovitis of left 3rd digit.   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 * S/P BKA now POD3 * D/C'd vancomycin and zosyn * Pain control with oxycontin 15mg  q12h and oxycodone 5mg  q6h prn * PT unable to see patient yesterday due to nausea, will see patient today  Nausea: Nausea poorly controlled, decreased  PO intake due to  symptoms. Concern for gastroparesis given DMII history. * Metoclopramide 5mg  TID with meals - no symptomatic relief with zofran or phenergan  ESRD: Nephro following, HD T/R/S. * Elevated phosphorus * D/C sevelamer - patient unable to tolerate * Sensipar 30mg  qday  Increased anion gap: Present on admission, trending up concurrent with down trending bicarbonate. * VBG and lactate ordered for evaluation of acidosis * Likely 2/2 ESRD, nephro is following  Hyponatremia: Present on admission, resolved. Will continue to monitor with daily metabolic panels.  HTN: Normotensive on the floor. * Hydralazine prn * Hold home meds  DMII: Blood glucose well controlled in low 100s recently, elevated in low 200s early in hospital stay and levemir increased from 3U to 9U qPM. * Continue levemir * Continue SSI-S  CAD:  * Will restart ASA 81mg   Anemia of chronic disease: Hgb 8.9 today.  Hypothyroidism: * Continue home meds  Stenosing flexor synovitis of left 3rd finger: Asymptomatic. * Will f/u with ortho as outpatient  FENGI: * Diet - Renal/Carb modified * Protonix * Senekot  DVT: * SCDs  This is a Careers information officer Note.  The care of the patient was discussed with Dr. Posey Pronto and the assessment and plan formulated with their assistance.  Please see their attached note for official documentation of the daily encounter.   LOS: 8 days   Jerene Dilling, Med Student 02/04/2014, 11:29 AM

## 2014-02-04 NOTE — Progress Notes (Signed)
  I have seen and examined the patient, and reviewed the daily progress note by Jerene Dilling, MS3 and discussed the care of the patient with them. Please see my progress note from 02/04/2014 for further details regarding assessment and plan.    Signed:  Charlott Rakes, MD 02/04/2014, 1:47 PM

## 2014-02-04 NOTE — Progress Notes (Signed)
I had previously discussed this with Dr. Marval Regal.  The patient does not have options for hemodialysis other than a tunneled catheter.  Her arteries are too small on the right arm and she would be incredibly high risk for steal.  She has already had a axillary brachial bypass on the left with Gore-Tex after a failed stent.  I would be surprised if this stays open very long and would not place a dialysis graft that is dependent on this bypass.  Based on angiography of both lower extremities, I think she has Buerger's disease.  I would not put a graft in her right leg at this time as I think it would compromise her below-knee amputation.  I also think she would be at high risk for steal if replaced a left thigh graft.  She should be considered for peritoneal dialysis  [name]

## 2014-02-04 NOTE — Progress Notes (Signed)
[facility] Progress Note  Assessment/Plan: 1. 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 ESRD - TTS - Has been seen in the past for permanent vascular access, but no plan yet - poor options; K 4.1- reassess edw post surgery-close to 52kgs now. Next HD Tuesday. 3. Hypertension/volume - 1002 - 120s 4. Anemia - Hgb 8.9- stable - ^Aranesp to 60 .q Thurs; Fe 54 tsat 26% ferritin 429 -weekly IV Fe started 10/22  5. Metabolic bone disease - Continue Hectorol -refusing binders in pill form - change to powdered renvela while here - she can continue prior renvela after d/c 6. Nutrition - Renal/carb motified diet. multivit Alb 2.6 - ad resource bid 7. Hypothyroidism - on synthroid  8. DM - per primary. on SSI at home - need to update outpt med list at d/c 9. Pain/ nausea - per primary, concern for gastroparesis. Starting reglan 10. Dispo- PT/OT- declined PT . will likely need SNF 11. Reflux sx - add PPI bid x 3 days then 1 day  Myriam Jacobson, PA-C [facility] Beeper [phone] 02/04/2014,9:10 AM  LOS: 8 days   Subjective:   C/o of upper esophogeal area of burning - refux like sx. Brother feeding her "b/c she is week. Can't take large renvela pills - thinks these are different from the ones at home she breaks in two.  Objective Filed Vitals:   02/03/14 1000 02/03/14 1700 02/03/14 2024 02/04/14 0556  BP: 107/62 102/51 126/81 125/68  Pulse: 96 87 92 90  Temp: 99.3 F (37.4 C) 99 F (37.2 C) 98.6 F (37 C) 98.2 F (36.8 C)  TempSrc: Oral Oral Oral Oral  Resp: 18 21 20 20   Height:      Weight:   52.101 kg (114 lb 13.8 oz)   SpO2: 98% 96% 99% 98%   Physical Exam General: NAD Heart: RRR 2/6 murmur Lungs: no rales Abdomen: soft Extremities: right BKA wrapped - no LLE edema/SCDs Dialysis Access: right IJ cath  Dialysis Orders: [facility] TTS 4 hr 160 Optiflux 400/600 EDW 55 2K 2.25 Ca right I-J profile 4  Aranesp 40 last given  10/15  Hectorol 1    Additional Objective Labs: Basic Metabolic Panel:  Recent Labs Lab 02/02/14 0723 02/03/14 0411 02/04/14 0550  NA 134* 139 138  K 4.0 4.1 4.1  CL 89* 96 95*  CO2 22 18* 16*  GLUCOSE 204* 110* 116*  BUN 53* 38* 59*  CREATININE 8.18* 5.79* 8.37*  CALCIUM 9.1 8.6 8.5  PHOS 10.6* 9.0* 10.4*   Liver Function Tests:  Recent Labs Lab 02/02/14 0723 02/03/14 0411 02/04/14 0550  ALBUMIN 2.9* 2.6* 2.6*  CBC:  Recent Labs Lab 01/29/14 0720 01/30/14 0530 01/31/14 0647 02/02/14 0722 02/04/14 0550  WBC 8.9 7.9 9.5 11.7* 11.6*  HGB 9.5* 9.9* 9.8* 10.1* 8.9*  HCT 29.5* 30.1* 29.8* 29.9* 27.4*  MCV 93.4 95.6 94.3 91.2 95.8  PLT 150 167 204 244 251  CBG:  Recent Labs Lab 02/03/14 0744 02/03/14 1145 02/03/14 1651 02/03/14 2028 02/04/14 0725  GLUCAP 116* 167* 141* 122* 110*   Medications:   . darbepoetin (ARANESP) injection - DIALYSIS  40 mcg Intravenous Q Thu-HD  . doxercalciferol  1 mcg Intravenous Q T,Th,Sa-HD  . ferric gluconate (FERRLECIT/NULECIT) IV  125 mg Intravenous Q Thu-HD  . insulin aspart  0-9 Units Subcutaneous TID WC  . insulin detemir  9 Units Subcutaneous QHS  . levothyroxine  25 mcg Oral QAC breakfast  .  metoCLOPramide  5 mg Oral TID AC  . multivitamin  1 tablet Oral QHS  . OxyCODONE  15 mg Oral Q12H  . senna-docusate  1 tablet Oral BID  . sevelamer carbonate  1,600 mg Oral TID WC  . sodium chloride  3 mL Intravenous Q12H

## 2014-02-04 NOTE — Progress Notes (Signed)
  Date: 02/04/2014  Patient name: Brandi Arellano  Medical record number: [id number]  Date of birth: September 11, 1955   This patient has been seen and the plan of care was discussed with the house staff. Please see their note for complete details. I concur with their findings with the following additions/corrections:  For her nausea, Zosyn could also be a cause and this was discontinued yesterday.  Will have to re-evaluate over next 24-48 hours if being off this medication will help as well as what is noted in resident note.  She is s/p BKA, and she is refusing PT due to nausea.  Will need better nausea control so that PT/OT can work with her.  Can do a trial of compazine, as phenergan and zofran do not seem to be helping.   Sid Falcon, MD 02/04/2014, 1:47 PM

## 2014-02-04 NOTE — Progress Notes (Signed)
PT Cancellation Note  Patient Details Name: Brandi Arellano MRN: [id number] DOB: 1955/12/22   Cancelled Treatment:    Reason Eval/Treat Not Completed: Pain limiting ability to participate;Fatigue/lethargy limiting ability to participate. Pt reports she is in too much pain to participate. RN present and offered meds, and pt declined due to nausea. Benefits of working with therapy were explained, however pt continued to refuse. Will check back as schedule allows.    Rolinda Roan 02/04/2014, 1:11 PM  Rolinda Roan, PT, DPT Acute Rehabilitation Services Pager: [phone]

## 2014-02-04 NOTE — Progress Notes (Addendum)
Subjective: She continues to complain of nausea and not wanting to eat. She does not note any feeling of hunger and also reports having heartburn after eating.  Objective: Vital signs in last 24 hours: Filed Vitals:   02/03/14 1700 02/03/14 2024 02/04/14 0556 02/04/14 0956  BP: 102/51 126/81 125/68 135/66  Pulse: 87 92 90 91  Temp: 99 F (37.2 C) 98.6 F (37 C) 98.2 F (36.8 C) 98.8 F (37.1 C)  TempSrc: Oral Oral Oral Oral  Resp: 21 20 20 16   Height:      Weight:  114 lb 13.8 oz (52.101 kg)    SpO2: 96% 99% 98% 99%   Weight change: 0 lb (0 kg)  Intake/Output Summary (Last 24 hours) at 02/04/14 1327 Last data filed at 02/04/14 1018  Gross per 24 hour  Intake    360 ml  Output      0 ml  Net    360 ml   General: resting in bed HEENT: EOMI, no scleral icterus Cardiac: RRR, no rubs, murmurs or gallops Pulm: clear to auscultation bilaterally Abd: soft, nontender, nondistended, BS present Ext: right BKA, bandage C/d/i   Lab Results: Basic Metabolic Panel:  Recent Labs Lab 02/03/14 0411 02/04/14 0550  NA 139 138  K 4.1 4.1  CL 96 95*  CO2 18* 16*  GLUCOSE 110* 116*  BUN 38* 59*  CREATININE 5.79* 8.37*  CALCIUM 8.6 8.5  PHOS 9.0* 10.4*   CBG:  Recent Labs Lab 02/03/14 0744 02/03/14 1145 02/03/14 1651 02/03/14 2028 02/04/14 0725 02/04/14 1225  GLUCAP 116* 167* 141* 122* 110* 164*    Micro Results: Recent Results (from the past 240 hour(s))  CULTURE, BLOOD (ROUTINE X 2)     Status: None   Collection Time    01/27/14  5:05 PM      Result Value Ref Range Status   Specimen Description BLOOD LEFT FOREARM   Final   Special Requests BOTTLES DRAWN AEROBIC ONLY 4CC   Final   Culture  Setup Time     Final   Value: 01/27/2014 22:34     Performed at [facility]   Culture     Final   Value: NO GROWTH 5 DAYS     Performed at [facility]   Report Status 02/02/2014 FINAL   Final  CULTURE, BLOOD (ROUTINE X 2)     Status: None   Collection Time    01/27/14  5:22 PM      Result Value Ref Range Status   Specimen Description BLOOD RIGHT HAND   Final   Special Requests BOTTLES DRAWN AEROBIC AND ANAEROBIC 5CC EA   Final   Culture  Setup Time     Final   Value: 01/27/2014 22:33     Performed at [facility]   Culture     Final   Value: NO GROWTH 5 DAYS     Performed at [facility]   Report Status 02/02/2014 FINAL   Final  MRSA PCR SCREENING     Status: None   Collection Time    01/28/14  4:24 AM      Result Value Ref Range Status   MRSA by PCR NEGATIVE  NEGATIVE Final   Comment:            The GeneXpert MRSA Assay (FDA     approved for NASAL specimens     only), is one component of a     comprehensive MRSA colonization  surveillance program. It is not     intended to diagnose MRSA     infection nor to guide or     monitor treatment for     MRSA infections.  SURGICAL PCR SCREEN     Status: None   Collection Time    01/31/14  9:52 PM      Result Value Ref Range Status   MRSA, PCR NEGATIVE  NEGATIVE Final   Staphylococcus aureus NEGATIVE  NEGATIVE Final   Comment:            The Xpert SA Assay (FDA     approved for NASAL specimens     in patients over 57 years of age),     is one component of     a comprehensive surveillance     program.  Test performance has     been validated by [employer] for patients greater     than or equal to 98 year old.     It is not intended     to diagnose infection nor to     guide or monitor treatment.   Studies/Results: No results found. Medications: I have reviewed the patient's current medications. Scheduled Meds: . cinacalcet  30 mg Oral Q breakfast  . [START ON 02/07/2014] darbepoetin (ARANESP) injection - DIALYSIS  60 mcg Intravenous Q Thu-HD  . doxercalciferol  1 mcg Intravenous Q T,Th,Sa-HD  . feeding supplement (RESOURCE BREEZE)  1 Container Oral BID BM  . ferric gluconate (FERRLECIT/NULECIT) IV  125 mg Intravenous Q Thu-HD  .  insulin aspart  0-9 Units Subcutaneous TID WC  . insulin detemir  9 Units Subcutaneous QHS  . levothyroxine  25 mcg Oral QAC breakfast  . metoCLOPramide  5 mg Oral TID AC  . multivitamin  1 tablet Oral QHS  . OxyCODONE  15 mg Oral Q12H  . pantoprazole (PROTONIX) IV  40 mg Intravenous Q12H  . senna-docusate  1 tablet Oral BID  . sodium chloride  3 mL Intravenous Q12H   Continuous Infusions:  PRN Meds:.sodium chloride, sodium chloride, heparin, hydrALAZINE, lidocaine (PF), lidocaine-prilocaine, oxyCODONE, pentafluoroprop-tetrafluoroeth, promethazine, promethazine Assessment/Plan: Principal Problem:   Ischemic ulcer of right foot, limited to breakdown of skin Active Problems:   End stage renal disease   Essential hypertension   DM type 2, uncontrolled, with renal complications   Hypothyroidism  Ms. Deblanc is a 58 year old female with DM2, ESRD, HTN, hypothyroidism who was hospitalized for ischemic ulcer of R foot with cellulitis and osteomyelitis likely 2/2 possible Buerger's disease s/p R BKA POD 3 found to have stenosing flexor synovitis of L 3rd digit now with persistent nausea and anion gap metabolic acidosis.   Nausea: Possible causes include diabetic gastroparesis, GERD, adverse effect of pain medication, immobilization. She was started on an empiric trial of Reglan 5mg  TID with meals yesterday. GERD uncommonly presents with nausea but has been documented in the literature. -Reassess nausea in response to Reglan -Encourage her to work with PT today -Give Protonix IV -Consider changing pain medication regimen if nausea is refractory  Ischemic ulcer of R foot with cellulitis and osteomyelitis 2/2 possible Buerger's disease s/p R BKA POD 3:  -Encourage her to work with PT/OT  -Continue pain control: OxyContin 15mg  BID & OxyIR 5mg  S8NI   AG metabolic acidosis: AG 27, bicarb 16. Likely 2/2 renal disease but unclear if there is another contributory factor.  -Check lactate &  VBG  ESRD on HD: Receiving dialysis today.  Renal following. -Consult Vascular Surgery for vascular access recommendations  Anemia of chronic disease: Hgb 8, stable at 10.   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  CAD: Restart ASA per Ortho recs.  Diastolic CHF: Stable for now.  HTN: BP trending 100s/60-80. Stable.  DM2: CBGs well controlled -Continue Levermir 9u QHS & SSI-S.  Hypothyroidism: Continue home meds.  FEN:  -Diet: Renal/Carb Modified  DVT prophylaxis: SCDs  CODE STATUS: FULL CODE  Dispo: Disposition is deferred at this time, awaiting improvement of current medical problems.  Need improvement of nausea and eval by PT and OT.  The patient does not have a current PCP (No primary provider on file.) and does not need an [facility] hospital follow-up appointment after discharge.  The patient does have transportation limitations that hinder transportation to clinic appointments.  .Services Needed at time of discharge: Y = Yes, Blank = No PT:   OT:   RN:   Equipment:   Other:     LOS: 8 days   Charlott Rakes, MD 02/04/2014, 1:27 PM

## 2014-02-04 NOTE — Progress Notes (Signed)
OT Cancellation Note  Patient Details Name: Brandi Arellano MRN: [id number] DOB: [birth year]   Cancelled Treatment:    Reason Eval/Treat Not Completed: Patient declined, I just don't feel like it. I explained the benefits of being up and moving (prevent blood clots, prevent PNA, prevent further weakness) she still declined. Will check back later as schedule allows.  Almon Register [phone] 02/04/2014, 11:24 AM

## 2014-02-04 NOTE — Progress Notes (Signed)
I saw the patient and agree with the above assessment and plan.

## 2014-02-05 ENCOUNTER — Encounter (HOSPITAL_COMMUNITY): Payer: Self-pay | Admitting: Orthopedic Surgery

## 2014-02-05 LAB — RENAL FUNCTION PANEL
Albumin: 2.6 g/dL — ABNORMAL LOW (ref 3.5–5.2)
Anion gap: 23 — ABNORMAL HIGH (ref 5–15)
BUN: 72 mg/dL — ABNORMAL HIGH (ref 6–23)
CO2: 19 mEq/L (ref 19–32)
Calcium: 7.7 mg/dL — ABNORMAL LOW (ref 8.4–10.5)
Chloride: 91 mEq/L — ABNORMAL LOW (ref 96–112)
Creatinine, Ser: 11.04 mg/dL — ABNORMAL HIGH (ref 0.50–1.10)
GFR calc Af Amer: 4 mL/min — ABNORMAL LOW (ref >90)
GFR calc non Af Amer: 3 mL/min — ABNORMAL LOW (ref >90)
Glucose, Bld: 176 mg/dL — ABNORMAL HIGH (ref 70–99)
Phosphorus: 10.9 mg/dL — ABNORMAL HIGH (ref 2.3–4.6)
Potassium: 4 mEq/L (ref 3.7–5.3)
Sodium: 133 mEq/L — ABNORMAL LOW (ref 137–147)

## 2014-02-05 LAB — CBC
HCT: 24.1 % — ABNORMAL LOW (ref 36.0–46.0)
Hemoglobin: 8 g/dL — ABNORMAL LOW (ref 12.0–15.0)
MCH: 31.6 pg (ref 26.0–34.0)
MCHC: 33.2 g/dL (ref 30.0–36.0)
MCV: 95.3 fL (ref 78.0–100.0)
Platelets: 222 10*3/uL (ref 150–400)
RBC: 2.53 MIL/uL — ABNORMAL LOW (ref 3.87–5.11)
RDW: 14.3 % (ref 11.5–15.5)
WBC: 11.3 10*3/uL — ABNORMAL HIGH (ref 4.0–10.5)

## 2014-02-05 LAB — GLUCOSE, CAPILLARY
GLUCOSE-CAPILLARY: 206 mg/dL — AB (ref 70–99)
Glucose-Capillary: 88 mg/dL (ref 70–99)
Glucose-Capillary: 185 mg/dL — ABNORMAL HIGH (ref 70–99)
Glucose-Capillary: 129 mg/dL — ABNORMAL HIGH (ref 70–99)

## 2014-02-05 MED ORDER — OXYCODONE HCL 5 MG PO TABS
ORAL_TABLET | ORAL | Status: AC
  Administered 2014-02-05: 5 mg via ORAL
  Filled 2014-02-05: qty 1

## 2014-02-05 MED ORDER — ALTEPLASE 100 MG IV SOLR
4.0000 mg | Freq: Once | INTRAVENOUS | Status: AC
  Administered 2014-02-05: 4 mg
  Filled 2014-02-05: qty 4

## 2014-02-05 MED ORDER — DOXERCALCIFEROL 4 MCG/2ML IV SOLN
INTRAVENOUS | Status: AC
  Administered 2014-02-05: 1 ug via INTRAVENOUS
  Filled 2014-02-05: qty 2

## 2014-02-05 NOTE — Progress Notes (Signed)
PT Cancellation Note  Patient Details Name: Brandi Arellano MRN: [id number] DOB: June 17, 1955   Cancelled Treatment:    Reason Eval/Treat Not Completed: Patient at procedure or test/unavailable. Checked on pt x2 today and was out of room both times. Will continue to follow and eval when able.    Rolinda Roan 02/05/2014, 5:50 PM  Rolinda Roan, PT, DPT Acute Rehabilitation Services Pager: [phone]

## 2014-02-05 NOTE — Clinical Social Work Psychosocial (Signed)
Clinical Social Work Department BRIEF PSYCHOSOCIAL ASSESSMENT 02/05/2014  Patient:  Brandi Arellano, Brandi Arellano     Account Number:  [id number]     [place] date:  01/27/2014  Clinical Social Worker:  Frederico Hamman  Date/Time:  02/05/2014 01:35 AM  Referred by:  Physician  Date Referred:  02/02/2014 Referred for  SNF Placement   Other Referral:   Interview type:  Patient Other interview type:   CSW also spoke with patient's brother, Brandi Arellano - [phone].    PSYCHOSOCIAL DATA Living Status:  ALONE Admitted from facility:   Level of care:   Primary support name:  Brandi Arellano Primary support relationship to patient:  SIBLING Degree of support available:   Patient reports that her brother handles her paperwork (or business) for her.    CURRENT CONCERNS Current Concerns  Post-Acute Placement   Other Concerns:    SOCIAL WORK ASSESSMENT / PLAN CSW talked with patient about discharge planning and MD's recommendation of ST rehab at discharge, and patient nodded understanding. Brandi Arellano is in agreement with rehab and when asked, responded that she has not been to facility for rehab before.  CSW provided patient with the skilled facility list for [facility] and explained the facility search process. Brandi Arellano requested that [place] contact her brother Brandi Arellano as he handles her paperwork.    CSW contacted brother regarding d/c plans for patient and he is in agreement. Brandi Arellano commented that his sister had been through a lot recently.  He requested assistance/information on getting a primary care physician for patient.   Assessment/plan status:  Psychosocial Support/Ongoing Assessment of Needs Other assessment/ plan:   Information/referral to community resources:   Skilled facility list for [facility]. Name and phone number for [facility] provided to patient/brother.    PATIENT'S/FAMILY'S RESPONSE TO PLAN OF CARE: Patient and brother  pleasant and receptive to short-term rehab rehab. Brandi Arellano expressed concern about patient and her wellbeing.

## 2014-02-05 NOTE — Consult Note (Signed)
Physical Medicine and Rehabilitation Consult  Reason for Consult: Right 5 th MT abscess with osteomyelitis and gangrenous changes right foot requiring R-BKA.  Referring Physician: Dr. Daryll Drown   HPI: Brandi Arellano is a 58 y.o. female with history of CAD, DM type 2 with peripheral neuropathy, ESRD, left transmet amputation; who stepped on foreign body with penetrating injury to right foot progressing to ulceration and infection. She was admitted on 01/27/14 with pain and ischemic gangrenous ulcer 5th MT head. MRI with evidence of abscess and 5th MT osteomyelitis and she was started on IV antibiotics with attempts at limb salvage. She continued to have progressive cellulitis with ongoing pain.  Recent angiogram with clinical picture of Buerger's disease and patient with significant PVD not amenable to revascularization and AKA v/s BKA recommended by Dr. Trula Slade. On 02/01/14, she underwent R-BKA by Dr. Sharol Given. Post op with nausea, pain and fatigue. Antibiotics discontinued and Reglan added with improvement in N/V and patient agreeable for OT evaluation yesterday. CIR recommended by MD and rehab team.    Review of Systems  HENT: Negative for hearing loss.   Eyes: Negative for blurred vision.  Respiratory: Negative for shortness of breath.   Cardiovascular: Negative for chest pain and palpitations.  Gastrointestinal: Negative for heartburn and abdominal pain.  Musculoskeletal: Negative for back pain, joint pain and myalgias.  Neurological: Positive for weakness. Negative for dizziness and headaches.   Past Medical History  Diagnosis Date  . CAD (coronary artery disease)   . Hypertension   . Diabetes mellitus without complication     Type II  . CKD (chronic kidney disease) stage 4, GFR 15-29 ml/min   . Hyperlipidemia   . CHF (congestive heart failure)     Acute on chronic diastolic  . UTI (lower urinary tract infection)   . Thyroid disease     Abnormal Thyroid function Test  . Anemia    . DVT (deep venous thrombosis)   . Thrombocytopenia    Past Surgical History  Procedure Laterality Date  . Coronary artery bypass graft    . Cholecystectomy    . Carotid endarterectomy      Bilateral  . Foot amputation Bilateral   . Amputation Right 02/01/2014    Procedure: Right Below Knee Amputation;  Surgeon: Newt Minion, MD;  Location: [facility];  Service: Orthopedics;  Laterality: Right;   Family History  Problem Relation Age of Onset  . Hypertension Father   . Heart disease Father     Coronary Artery Disease    Social History:  Moved to [place] a couple of months ago to live with brothers who provide 24 hours supervision. She was independent PTA. She reports that she has quit smoking "years ago". Her smoking use included Cigarettes. She smoked for 15 years. She has never used smokeless tobacco. She reports that she does not drink alcohol or use illicit drugs.   Allergies  Allergen Reactions  . Diphenhydramine Nausea And Vomiting  . Tramadol Hcl Nausea And Vomiting  . Atenolol Other (See Comments)    unknown  . Iodinated Diagnostic Agents Other (See Comments)    unknown  . Metoprolol Other (See Comments)    unknown  . Morphine And Related Other (See Comments)    unknown  . Thiopental Other (See Comments)    Other    Medications Prior to Admission  Medication Sig Dispense Refill  . aspirin EC 81 MG tablet Take 81 mg by mouth 2 (two) times daily.       Marland Kitchen  insulin aspart (NOVOLOG) 100 UNIT/ML injection Inject 0-15 Units into the skin 3 (three) times daily with meals.  10 mL  0  . insulin detemir (LEVEMIR) 100 UNIT/ML injection Inject 0.03 mLs (3 Units total) into the skin at bedtime.  10 mL  0  . levothyroxine (SYNTHROID, LEVOTHROID) 25 MCG tablet Take 25 mcg by mouth daily before breakfast.        Home: Home Living Family/patient expects to be discharged to:: Private residence Living Arrangements: Other relatives (brother) Available Help at Discharge: Family;Available  24 hours/day Type of Home: House Home Access: Stairs to enter [employer] of Steps: 2 Entrance Stairs-Rails: Right;Left;Can reach both Home Layout: One level Home Equipment: Walker - 2 wheels Additional Comments: Tub shower with curtain  Functional History: Prior Function Level of Independence: Independent Comments: did not require AD to walk Functional Status:  Mobility: Bed Mobility Overal bed mobility: Needs Assistance Bed Mobility: Supine to Sit;Sit to Supine Supine to sit: Supervision Sit to supine: Supervision General bed mobility comments: HOB up, use of rail Transfers Overall transfer level: Needs assistance Transfers: Stand Pivot Transfers Stand pivot transfers: Min assist      ADL: ADL Overall ADL's : Needs assistance/impaired Eating/Feeding: Independent;Bed level Grooming: Wash/dry hands;Wash/dry face;Oral care;Sitting;Supervision/safety (EOB) Upper Body Bathing: Supervision/ safety;Sitting Lower Body Bathing: Minimal assistance;Sitting/lateral leans Upper Body Dressing : Supervision/safety;Sitting (EOB) Lower Body Dressing: Minimal assistance;Sitting/lateral leans Toilet Transfer: Minimal assistance;Squat-pivot;BSC Toileting- Clothing Manipulation and Hygiene: Supervision/safety;Sitting/lateral lean  Cognition: Cognition Overall Cognitive Status: Within Functional Limits for tasks assessed Orientation Level: Oriented X4 Cognition Arousal/Alertness: Awake/alert Behavior During Therapy: WFL for tasks assessed/performed Overall Cognitive Status: Within Functional Limits for tasks assessed Difficult to assess due to:  (high pain level)  Blood pressure 118/70, pulse 96, temperature 97.2 F (36.2 C), temperature source Oral, resp. rate 18, height 5\' 3"  (1.6 m), weight 54.4 kg (119 lb 14.9 oz), SpO2 100.00%. Physical Exam  Nursing note and vitals reviewed. Constitutional: She is oriented to person, place, and time. She appears well-developed and  well-nourished.  HENT:  Head: Normocephalic and atraumatic.  Eyes: Conjunctivae are normal. Pupils are equal, round, and reactive to light.  Neck: Neck supple.  Cardiovascular: Normal rate and regular rhythm.   Respiratory: Effort normal and breath sounds normal. No respiratory distress. She has no wheezes.  GI: Soft. Bowel sounds are normal. She exhibits no distension. There is no tenderness.  Musculoskeletal: She exhibits no tenderness.  R-BKA with coban dressing. 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 Moves BUE and LLE without difficulty.   Neurological: She is alert and oriented to person, place, and time.  Able to follow basic commands without difficulty. Memory deficits noted.    Skin: Skin is warm and dry.    Results for orders placed during the hospital encounter of 01/27/14 (from the past 24 hour(s))  GLUCOSE, CAPILLARY     Status: None   Collection Time    02/04/14  5:23 PM      Result Value Ref Range   Glucose-Capillary 99  70 - 99 mg/dL  BLOOD GAS, VENOUS     Status: Abnormal   Collection Time    02/04/14  7:58 PM      Result Value Ref Range   pH, Ven 7.244 (*) 7.250 - 7.300   pCO2, Ven 44.5 (*) 45.0 - 50.0 mmHg   pO2, Ven 182.0 (*) 30.0 - 45.0 mmHg   Bicarbonate 18.6 (*) 20.0 - 24.0 mEq/L   TCO2 19.9  0 - 100 mmol/L  Acid-base deficit 7.5 (*) 0.0 - 2.0 mmol/L   O2 Saturation 98.7     Patient temperature 98.6     Drawn by COLLECTED BY LABORATORY     Sample type VENOUS    GLUCOSE, CAPILLARY     Status: Abnormal   Collection Time    02/04/14  8:36 PM      Result Value Ref Range   Glucose-Capillary 104 (*) 70 - 99 mg/dL  GLUCOSE, CAPILLARY     Status: None   Collection Time    02/05/14  7:49 AM      Result Value Ref Range   Glucose-Capillary 88  70 - 99 mg/dL  GLUCOSE, CAPILLARY     Status: Abnormal   Collection Time    02/05/14 11:51 AM      Result Value Ref Range   Glucose-Capillary 185 (*) 70 - 99 mg/dL  CBC     Status: Abnormal   Collection Time     02/05/14  1:43 PM      Result Value Ref Range   WBC 11.3 (*) 4.0 - 10.5 K/uL   RBC 2.53 (*) 3.87 - 5.11 MIL/uL   Hemoglobin 8.0 (*) 12.0 - 15.0 g/dL   HCT 24.1 (*) 36.0 - 46.0 %   MCV 95.3  78.0 - 100.0 fL   MCH 31.6  26.0 - 34.0 pg   MCHC 33.2  30.0 - 36.0 g/dL   RDW 14.3  11.5 - 15.5 %   Platelets 222  150 - 400 K/uL  RENAL FUNCTION PANEL     Status: Abnormal   Collection Time    02/05/14  1:43 PM      Result Value Ref Range   Sodium 133 (*) 137 - 147 mEq/L   Potassium 4.0  3.7 - 5.3 mEq/L   Chloride 91 (*) 96 - 112 mEq/L   CO2 19  19 - 32 mEq/L   Glucose, Bld 176 (*) 70 - 99 mg/dL   BUN 72 (*) 6 - 23 mg/dL   Creatinine, Ser 11.04 (*) 0.50 - 1.10 mg/dL   Calcium 7.7 (*) 8.4 - 10.5 mg/dL   Phosphorus 10.9 (*) 2.3 - 4.6 mg/dL   Albumin 2.6 (*) 3.5 - 5.2 g/dL   GFR calc non Af Amer 3 (*) >90 mL/min   GFR calc Af Amer 4 (*) >90 mL/min   Anion gap 23 (*) 5 - 15   No results found.  Assessment/Plan: Diagnosis: right BKA 1. Does the need for close, 24 hr/day medical supervision in concert with the patient's rehab needs make it unreasonable for this patient to be served in a less intensive setting? Potentially 2. Co-Morbidities requiring supervision/potential complications: ESRD, HTN, dm2 3. Due to bladder management, bowel management, safety, skin/wound care, disease management, medication administration, pain management and patient education, does the patient require 24 hr/day rehab nursing? Yes 4. Does the patient require coordinated care of a physician, rehab nurse, PT (1-2 hrs/day, 5 days/week) and OT (1-2 hrs/day, 5 days/week) to address physical and functional deficits in the context of the above medical diagnosis(es)? Yes Addressing deficits in the following areas: balance, endurance, locomotion, strength, transferring, bowel/bladder control, bathing, dressing, feeding, grooming and toileting 5. Can the patient actively participate in an intensive therapy program of at least 3  hrs of therapy per day at least 5 days per week? Potentially 6. The potential for patient to make measurable gains while on inpatient rehab is good and fair 7. Anticipated functional outcomes upon discharge from  inpatient rehab are n/a  with PT, n/a with OT, n/a with SLP. 8. Estimated rehab length of stay to reach the above functional goals is: n/a 9. Does the patient have adequate social supports to accommodate these discharge functional goals? N/A 10. Anticipated D/C setting: Home 11. Anticipated post D/C treatments: [facility] therapy 12. Overall Rehab/Functional Prognosis: excellent  RECOMMENDATIONS: This patient's condition is appropriate for continued rehabilitative care in the following setting: [facility] Therapy Patient has agreed to participate in recommended program. Yes Note that insurance prior authorization may be required for reimbursement for recommended care.  Comment: Pt has ample social supports. She has done well thus far with therapy. Home is accessible. Recommend [facility], MD, [facility] 02/06/2014     02/05/2014

## 2014-02-05 NOTE — Evaluation (Signed)
Occupational Therapy Evaluation Patient Details Name: Brandi Arellano MRN: [id number] DOB: 12-03-1955 Today's Date: 02/05/2014    History of Present Illness S/P R BKA, PMH:  DM2, ESRD, CAD, CHF, HTN, amputation of L forefoot.   Clinical Impression   Pt was performing ADL and IADL independently PTA.  She now presents with generalized  weakness and impaired balance interfering with with ability to perform self care and IADL .  Pt will be an excellent inpatient rehab candidate with potential to function at a modified independent level and return home with her brother.    Follow Up Recommendations  CIR    Equipment Recommendations  3 in 1 bedside comode    Recommendations for Other Services       Precautions / Restrictions Precautions Precautions: Fall Restrictions Weight Bearing Restrictions: No      Mobility Bed Mobility Overal bed mobility: Needs Assistance Bed Mobility: Supine to Sit;Sit to Supine     Supine to sit: Supervision Sit to supine: Supervision   General bed mobility comments: HOB up, use of rail  Transfers Overall transfer level: Needs assistance   Transfers: Stand Pivot Transfers   Stand pivot transfers: Min assist            Balance Overall balance assessment: Needs assistance Sitting-balance support: Feet supported Sitting balance-Leahy Scale: Fair                                      ADL Overall ADL's : Needs assistance/impaired Eating/Feeding: Independent;Bed level   Grooming: Wash/dry hands;Wash/dry face;Oral care;Sitting;Supervision/safety (EOB)   Upper Body Bathing: Supervision/ safety;Sitting   Lower Body Bathing: Minimal assistance;Sitting/lateral leans   Upper Body Dressing : Supervision/safety;Sitting (EOB)   Lower Body Dressing: Minimal assistance;Sitting/lateral leans   Toilet Transfer: Minimal assistance;Squat-pivot;BSC   Toileting- Clothing Manipulation and Hygiene: Supervision/safety;Sitting/lateral  lean               Vision                     Perception     Praxis      Pertinent Vitals/Pain Pain Assessment: 0-10 Pain Score: 5  Pain Location: throat Pain Descriptors / Indicators: Burning Pain Intervention(s): Monitored during session;Other (comment) (ice chips)     Hand Dominance Right   Extremity/Trunk Assessment Upper Extremity Assessment Upper Extremity Assessment: LUE deficits/detail RUE Deficits / Details: generalized weakness LUE Deficits / Details: tenosynovitis long finger, longstanding and generalized weakness   Lower Extremity Assessment Lower Extremity Assessment: Defer to PT evaluation   Cervical / Trunk Assessment Cervical / Trunk Assessment: Normal   Communication Communication Communication: No difficulties   Cognition Arousal/Alertness: Awake/alert Behavior During Therapy: WFL for tasks assessed/performed Overall Cognitive Status: Within Functional Limits for tasks assessed                     General Comments       Exercises       Shoulder Instructions      Home Living Family/patient expects to be discharged to:: Private residence Living Arrangements: Other relatives (brother) Available Help at Discharge: Family;Available 24 hours/day Type of Home: House Home Access: Stairs to enter [employer] of Steps: 2 Entrance Stairs-Rails: Right;Left;Can reach both Home Layout: One level     Bathroom Shower/Tub:  (doesn't shower)   Bathroom Toilet: Standard     Home Equipment: Environmental consultant - 2 wheels  Prior Functioning/Environment Level of Independence: Independent        Comments: did not require AD to walk    OT Diagnosis: Generalized weakness;Acute pain   OT Problem List: Decreased strength;Decreased activity tolerance;Impaired balance (sitting and/or standing);Decreased knowledge of use of DME or AE;Decreased safety awareness;Pain   OT Treatment/Interventions: Self-care/ADL  training;Therapeutic exercise;DME and/or AE instruction;Therapeutic activities;Patient/family education;Balance training    OT Goals(Current goals can be found in the care plan section) Acute Rehab OT Goals Patient Stated Goal: Regain independence. OT Goal Formulation: With patient Time For Goal Achievement: 02/19/14 Potential to Achieve Goals: Good ADL Goals Pt Will Perform Grooming: with set-up;sitting Pt Will Perform Upper Body Bathing: with set-up;sitting Pt Will Perform Lower Body Bathing: with set-up;sit to/from stand Pt Will Perform Upper Body Dressing: with set-up;sitting Pt Will Perform Lower Body Dressing: with set-up;sit to/from stand Pt Will Transfer to Toilet: with supervision;ambulating;bedside commode Pt Will Perform Toileting - Clothing Manipulation and hygiene: with supervision;sit to/from stand  OT Frequency: Min 2X/week   Barriers to D/C:            Co-evaluation              End of Session    Activity Tolerance: Patient tolerated treatment well Patient left: in bed;with call bell/phone within reach   Time: 1220-1245 OT Time Calculation (min): 25 min Charges:  OT General Charges $OT Visit: 1 Procedure OT Evaluation $Initial OT Evaluation Tier I: 1 Procedure OT Treatments $Self Care/Home Management : 8-22 mins G-Codes:    Malka So 02/05/2014, 3:09 PM [phone]

## 2014-02-05 NOTE — Progress Notes (Signed)
  Date: 02/05/2014  Patient name: Brandi Arellano  Medical record number: [id number]  Date of birth: [birth year]   This patient has been seen and the plan of care was discussed with the house staff. Please see their note for complete details. I concur with their findings.  Sid Falcon, MD 02/05/2014, 11:30 AM

## 2014-02-05 NOTE — Progress Notes (Signed)
Subjective: Patient laying in bed eating breakfast, in good spirits, much more verbal today, no complaints of nausea or vomiting. Objective: Vital signs in last 24 hours: Filed Vitals:   02/04/14 2038 02/05/14 0531 02/05/14 0545 02/05/14 1001  BP: 159/68  127/72 146/72  Pulse: 96  87 98  Temp: 97.5 F (36.4 C)  98 F (36.7 C) 98.4 F (36.9 C)  TempSrc: Oral Oral Oral Oral  Resp: 16  16 18   Height:      Weight: 52.101 kg (114 lb 13.8 oz)     SpO2: 99%  96% 100%   Weight change: 0 kg (0 lb)  Intake/Output Summary (Last 24 hours) at 02/05/14 1115 Last data filed at 02/05/14 1000  Gross per 24 hour  Intake    508 ml  Output      0 ml  Net    508 ml   Physical Exam:  Neuro: Pain well controlled, AAOx3  CV: RRR, systolic murmur  Pulm: CTAB, normal work of breathing  Abd: Tender to palpation, no rebound or guarding  Ext: Right lower extremity s/p BKA and dressed, left lower extremity warm and well perfused without edema, bilateral upper extremities warm and well perfused with 3+ pulses  Lab Results: Results for Brandi, Arellano (MRN [id number]) as of 02/05/2014 11:17  Ref. Range 02/04/2014 19:58  Sample type No range found VENOUS  pH, Ven Latest Range: 7.250-7.300  7.244 (L)  pCO2, Ven Latest Range: 45.0-50.0 mmHg 44.5 (L)  pO2, Ven Latest Range: 30.0-45.0 mmHg 182.0 (H)  Bicarbonate Latest Range: 20.0-24.0 mEq/L 18.6 (L)  TCO2 Latest Range: 0-100 mmol/L 19.9  Acid-base deficit Latest Range: 0.0-2.0 mmol/L 7.5 (H)  O2 Saturation No range found 98.7  Patient temperature No range found 98.6   Micro Results: Recent Results (from the past 240 hour(s))  CULTURE, BLOOD (ROUTINE X 2)     Status: None   Collection Time    01/27/14  5:05 PM      Result Value Ref Range Status   Specimen Description BLOOD LEFT FOREARM   Final   Special Requests BOTTLES DRAWN AEROBIC ONLY 4CC   Final   Culture  Setup Time     Final   Value: 01/27/2014 22:34     Performed at [facility]     Culture     Final   Value: NO GROWTH 5 DAYS     Performed at [facility]   Report Status 02/02/2014 FINAL   Final  CULTURE, BLOOD (ROUTINE X 2)     Status: None   Collection Time    01/27/14  5:22 PM      Result Value Ref Range Status   Specimen Description BLOOD RIGHT HAND   Final   Special Requests BOTTLES DRAWN AEROBIC AND ANAEROBIC 5CC EA   Final   Culture  Setup Time     Final   Value: 01/27/2014 22:33     Performed at [facility]   Culture     Final   Value: NO GROWTH 5 DAYS     Performed at [facility]   Report Status 02/02/2014 FINAL   Final  MRSA PCR SCREENING     Status: None   Collection Time    01/28/14  4:24 AM      Result Value Ref Range Status   MRSA by PCR NEGATIVE  NEGATIVE Final   Comment:            The GeneXpert MRSA Assay (  FDA     approved for NASAL specimens     only), is one component of a     comprehensive MRSA colonization     surveillance program. It is not     intended to diagnose MRSA     infection nor to guide or     monitor treatment for     MRSA infections.  SURGICAL PCR SCREEN     Status: None   Collection Time    01/31/14  9:52 PM      Result Value Ref Range Status   MRSA, PCR NEGATIVE  NEGATIVE Final   Staphylococcus aureus NEGATIVE  NEGATIVE Final   Comment:            The Xpert SA Assay (FDA     approved for NASAL specimens     in patients over 25 years of age),     is one component of     a comprehensive surveillance     program.  Test performance has     been validated by [employer] for patients greater     than or equal to 35 year old.     It is not intended     to diagnose infection nor to     guide or monitor treatment.   Medications: Scheduled Meds: . cinacalcet  30 mg Oral Q breakfast  . [START ON 02/07/2014] darbepoetin (ARANESP) injection - DIALYSIS  60 mcg Intravenous Q Thu-HD  . doxercalciferol  1 mcg Intravenous Q T,Th,Sa-HD  . feeding supplement (RESOURCE BREEZE)  1 Container  Oral BID BM  . ferric gluconate (FERRLECIT/NULECIT) IV  125 mg Intravenous Q Thu-HD  . insulin aspart  0-9 Units Subcutaneous TID WC  . insulin detemir  9 Units Subcutaneous QHS  . levothyroxine  25 mcg Oral QAC breakfast  . metoCLOPramide  5 mg Oral TID AC  . multivitamin  1 tablet Oral QHS  . OxyCODONE  15 mg Oral Q12H  . pantoprazole (PROTONIX) IV  40 mg Intravenous Q12H  . senna-docusate  1 tablet Oral BID  . sodium chloride  3 mL Intravenous Q12H   Continuous Infusions:  PRN Meds:.sodium chloride, sodium chloride, heparin, hydrALAZINE, lidocaine (PF), lidocaine-prilocaine, oxyCODONE, pentafluoroprop-tetrafluoroeth, promethazine, promethazine Assessment/Plan:  Brandi Arellano is a 58 yo female with a history of DMII, ESRD on HD T/R/S, HTN, hypothyroidism who presents with right foot ischemic ulcer, possible osteomyelitis suspicious for Buerger's disease and incidentally found to have stenosing flexor synovitis of left 3rd digit.  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  * S/P BKA now POD4  * Pain control with oxycontin 15mg  q12h and oxycodone 5mg  q6h prn  * PT unable to see patient yesterday due to nausea, will see patient today   Nausea: Well controlled today, able to eat a full breakfast without symptoms.  * Metoclopramide 5mg  TID with meals   ESRD: Nephro following, HD T/R/S.  * Elevated phosphorus * Sensipar 30mg  qday   Increased anion gap: Present on admission, trending up concurrent with down trending bicarbonate.  * VBG demonstrates borderline acidosis, lactate normal  * Likely 2/2 ESRD, nephro is following   Hyponatremia: Present on admission, resolved. Will continue to monitor with daily metabolic panels.   HTN: Normotensive on the floor.  * Hydralazine prn  * Hold home meds   DMII:  Blood glucose  well controlled in low 100s recently, elevated in low 200s early in hospital stay and levemir increased from 3U to 9U qPM.  * Continue levemir  * Continue SSI-S   CAD:  * Will restart ASA 81mg  when ok'd by ortho   Anemia of chronic disease: Hgb 8.9 today.   Hypothyroidism:  * Continue home meds   Stenosing flexor synovitis of left 3rd finger: Asymptomatic.  * Will f/u with ortho as outpatient   FENGI:  * Diet - Renal/Carb modified  * Protonix  * Senekot   DVT:  * SCD  This is a Careers information officer Note.  The care of the patient was discussed with Dr. Posey Pronto and the assessment and plan formulated with their assistance.  Please see their attached note for official documentation of the daily encounter.   LOS: 9 days   Jerene Dilling, Med Student 02/05/2014, 11:15 AM

## 2014-02-05 NOTE — Progress Notes (Signed)
Rehab Admissions Coordinator Note:  Patient was screened by Cleatrice Burke for appropriateness for an Inpatient Acute Rehab Consult per OT eval.At this time, we are recommending Inpatient Rehab consult.  Cleatrice Burke 02/05/2014, 3:22 PM  I can be reached at [phone].

## 2014-02-05 NOTE — Progress Notes (Signed)
Subjective: She appears markedly important this AM at bedside and is sitting up. She is willing to try eating again today. Her brother is with her at bedside and is interested in establish follow-up with a regular doctor.  Objective: Vital signs in last 24 hours: Filed Vitals:   02/04/14 1724 02/04/14 2038 02/05/14 0531 02/05/14 0545  BP: 144/71 159/68  127/72  Pulse: 88 96  87  Temp: 98 F (36.7 C) 97.5 F (36.4 C)  98 F (36.7 C)  TempSrc: Oral Oral Oral Oral  Resp: 16 16  16   Height:      Weight:  114 lb 13.8 oz (52.101 kg)    SpO2: 99% 99%  96%   Weight change: 0 lb (0 kg)  Intake/Output Summary (Last 24 hours) at 02/05/14 0711 Last data filed at 02/05/14 0546  Gross per 24 hour  Intake    123 ml  Output      0 ml  Net    123 ml   General: resting in bed HEENT: EOMI, no scleral icterus Cardiac: RRR, no rubs, murmurs or gallops Pulm: clear to auscultation bilaterally Abd: soft, nontender, nondistended, BS present Ext: right BKA, bandage covered/dry/intact  Lab Results: Basic Metabolic Panel:  Recent Labs Lab 02/03/14 0411 02/04/14 0550  NA 139 138  K 4.1 4.1  CL 96 95*  CO2 18* 16*  GLUCOSE 110* 116*  BUN 38* 59*  CREATININE 5.79* 8.37*  CALCIUM 8.6 8.5  PHOS 9.0* 10.4*   CBG:  Recent Labs Lab 02/03/14 1651 02/03/14 2028 02/04/14 0725 02/04/14 1225 02/04/14 1723 02/04/14 2036  GLUCAP 141* 122* 110* 164* 99 104*    Studies/Results: No results found. Medications: I have reviewed the patient's current medications. Scheduled Meds: . cinacalcet  30 mg Oral Q breakfast  . [START ON 02/07/2014] darbepoetin (ARANESP) injection - DIALYSIS  60 mcg Intravenous Q Thu-HD  . doxercalciferol  1 mcg Intravenous Q T,Th,Sa-HD  . feeding supplement (RESOURCE BREEZE)  1 Container Oral BID BM  . ferric gluconate (FERRLECIT/NULECIT) IV  125 mg Intravenous Q Thu-HD  . insulin aspart  0-9 Units Subcutaneous TID WC  . insulin detemir  9 Units Subcutaneous QHS    . levothyroxine  25 mcg Oral QAC breakfast  . metoCLOPramide  5 mg Oral TID AC  . multivitamin  1 tablet Oral QHS  . OxyCODONE  15 mg Oral Q12H  . pantoprazole (PROTONIX) IV  40 mg Intravenous Q12H  . senna-docusate  1 tablet Oral BID  . sodium chloride  3 mL Intravenous Q12H   Continuous Infusions:  PRN Meds:.sodium chloride, sodium chloride, heparin, hydrALAZINE, lidocaine (PF), lidocaine-prilocaine, oxyCODONE, pentafluoroprop-tetrafluoroeth, promethazine, promethazine Assessment/Plan: Principal Problem:   Ischemic ulcer of right foot, limited to breakdown of skin Active Problems:   End stage renal disease   Essential hypertension   DM type 2, uncontrolled, with renal complications   Hypothyroidism  Brandi Arellano is a 58 year old female with DM2, ESRD, HTN, hypothyroidism who was hospitalized for ischemic ulcer of R foot with cellulitis and osteomyelitis likely 2/2 possible Buerger's disease s/p R BKA POD 4 found to have stenosing flexor synovitis of L 3rd digit now with improved nausea likely 2/2 diabetic gastroparesis and anion gap metabolic acidosis.   Improved nausea likely 2/2 diabetic gastroparesis: Improved on Reglan 5mg  TID making diabetic gastroparesis likely.  -Defer gastric emptying study until outpatient when she can tolerate being off Reglan  Ischemic ulcer of R foot with cellulitis and osteomyelitis 2/2 possible Buerger's  disease s/p R BKA POD 4:  -Encourage her to work with PT/OT  -Continue pain control: OxyContin 15mg  BID & OxyIR 5mg  O7MB   AG metabolic acidosis: AG 23, bicarb 19. Lactate unremarkable yesterday. Likely 2/2 renal disease but unclear if there is another contributory factor. Using VBG to estimate ABG yesterday's values, it appears she has a mixed acid-base disorder (uncompensated anion gap & non-anion gap metabolic acidosis).   -Confirm with ABG tomorrow  ESRD on HD: Receiving dialysis today.  -Renal following, appreciate recs  Anemia of chronic  disease: Hgb stable at 8.   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  CAD: Restart ASA per Ortho recs.  Diastolic CHF: Stable for now.  HTN: BP trending 100s/60-80. Stable.  DM2: CBGs well controlled -Continue Levermir 9u QHS & SSI-S.  Hypothyroidism: Continue home meds.  FEN:  -Diet: Renal/Carb Modified  DVT prophylaxis: SCDs  CODE STATUS: FULL CODE  Dispo: Disposition is deferred at this time, awaiting improvement of current medical problems.  Need improvement of nausea and eval by PT and OT.  The patient does not have a current PCP (No primary provider on file.) and does not need an [facility] hospital follow-up appointment after discharge.  The patient does have transportation limitations that hinder transportation to clinic appointments.  .Services Needed at time of discharge: Y = Yes, Blank = No PT:   OT: CIR  RN:   Equipment: 3-in-1 bedside commode  Other:     LOS: 9 days   Charlott Rakes, MD 02/05/2014, 7:11 AM

## 2014-02-05 NOTE — Progress Notes (Signed)
  I have seen and examined the patient myself, and I have reviewed the note by Wyatt Mage, MS 3 and was present during the interview and physical exam.  Please see my separate H&P for additional findings, assessment, and plan.   Signed: Charlott Rakes, MD 02/05/2014, 5:37 PM

## 2014-02-05 NOTE — Progress Notes (Signed)
UR completed 

## 2014-02-05 NOTE — Procedures (Signed)
I was present at this dialysis session. I have reviewed the session itself and made appropriate changes.   N/V is improved after trial of reglan.  Eating.  Better spirits.  TDC.   Pearson Grippe  MD 02/05/2014, 3:24 PM

## 2014-02-06 ENCOUNTER — Inpatient Hospital Stay (HOSPITAL_COMMUNITY): Payer: Medicaid Other

## 2014-02-06 DIAGNOSIS — Z992 Dependence on renal dialysis: Secondary | ICD-10-CM

## 2014-02-06 DIAGNOSIS — Z89511 Acquired absence of right leg below knee: Secondary | ICD-10-CM

## 2014-02-06 DIAGNOSIS — N186 End stage renal disease: Secondary | ICD-10-CM

## 2014-02-06 DIAGNOSIS — E1129 Type 2 diabetes mellitus with other diabetic kidney complication: Secondary | ICD-10-CM

## 2014-02-06 DIAGNOSIS — L089 Local infection of the skin and subcutaneous tissue, unspecified: Secondary | ICD-10-CM

## 2014-02-06 DIAGNOSIS — D631 Anemia in chronic kidney disease: Secondary | ICD-10-CM | POA: Diagnosis present

## 2014-02-06 DIAGNOSIS — E1165 Type 2 diabetes mellitus with hyperglycemia: Secondary | ICD-10-CM

## 2014-02-06 DIAGNOSIS — N2581 Secondary hyperparathyroidism of renal origin: Secondary | ICD-10-CM | POA: Diagnosis present

## 2014-02-06 LAB — BLOOD GAS, ARTERIAL
Acid-base deficit: 2.6 mmol/L — ABNORMAL HIGH (ref 0.0–2.0)
BICARBONATE: 22 meq/L (ref 20.0–24.0)
DRAWN BY: 234041
FIO2: 0.21 %
O2 Saturation: 95.5 %
PCO2 ART: 40.6 mmHg (ref 35.0–45.0)
PH ART: 7.354 (ref 7.350–7.450)
PO2 ART: 82.7 mmHg (ref 80.0–100.0)
Patient temperature: 98.6
TCO2: 23.3 mmol/L (ref 0–100)

## 2014-02-06 LAB — GLUCOSE, CAPILLARY
GLUCOSE-CAPILLARY: 217 mg/dL — AB (ref 70–99)
Glucose-Capillary: 92 mg/dL (ref 70–99)
Glucose-Capillary: 142 mg/dL — ABNORMAL HIGH (ref 70–99)

## 2014-02-06 LAB — BASIC METABOLIC PANEL
ANION GAP: 20 — AB (ref 5–15)
BUN: 45 mg/dL — ABNORMAL HIGH (ref 6–23)
CHLORIDE: 94 meq/L — AB (ref 96–112)
CO2: 21 meq/L (ref 19–32)
Calcium: 8.4 mg/dL (ref 8.4–10.5)
Creatinine, Ser: 8.26 mg/dL — ABNORMAL HIGH (ref 0.50–1.10)
GFR calc Af Amer: 6 mL/min — ABNORMAL LOW (ref >90)
GFR calc non Af Amer: 5 mL/min — ABNORMAL LOW (ref >90)
GLUCOSE: 127 mg/dL — AB (ref 70–99)
POTASSIUM: 3.9 meq/L (ref 3.7–5.3)
SODIUM: 135 meq/L — AB (ref 137–147)

## 2014-02-06 LAB — CBC
HCT: 24 % — ABNORMAL LOW (ref 36.0–46.0)
HEMOGLOBIN: 7.9 g/dL — AB (ref 12.0–15.0)
MCH: 31.6 pg (ref 26.0–34.0)
MCHC: 32.9 g/dL (ref 30.0–36.0)
MCV: 96 fL (ref 78.0–100.0)
PLATELETS: 208 10*3/uL (ref 150–400)
RBC: 2.5 MIL/uL — AB (ref 3.87–5.11)
RDW: 14.3 % (ref 11.5–15.5)
WBC: 9.4 10*3/uL (ref 4.0–10.5)

## 2014-02-06 MED ORDER — LIDOCAINE HCL 1 % IJ SOLN
INTRAMUSCULAR | Status: AC
  Filled 2014-02-06: qty 20

## 2014-02-06 MED ORDER — HEPARIN SODIUM (PORCINE) 1000 UNIT/ML IJ SOLN
INTRAMUSCULAR | Status: AC
  Filled 2014-02-06: qty 1

## 2014-02-06 MED ORDER — OXYCODONE HCL 5 MG PO TABS
ORAL_TABLET | ORAL | Status: AC
  Filled 2014-02-06: qty 1

## 2014-02-06 MED ORDER — CHLORHEXIDINE GLUCONATE 4 % EX LIQD
CUTANEOUS | Status: AC
  Filled 2014-02-06: qty 15

## 2014-02-06 MED ORDER — CEFAZOLIN SODIUM-DEXTROSE 2-3 GM-% IV SOLR
2.0000 g | INTRAVENOUS | Status: AC
  Administered 2014-02-06: 2 g via INTRAVENOUS
  Filled 2014-02-06: qty 50

## 2014-02-06 MED ORDER — PANTOPRAZOLE SODIUM 40 MG PO TBEC
40.0000 mg | DELAYED_RELEASE_TABLET | Freq: Two times a day (BID) | ORAL | Status: DC
  Administered 2014-02-06 – 2014-02-13 (×13): 40 mg via ORAL
  Filled 2014-02-06 (×14): qty 1

## 2014-02-06 MED ORDER — CEFAZOLIN SODIUM-DEXTROSE 2-3 GM-% IV SOLR
INTRAVENOUS | Status: AC
  Administered 2014-02-06: 2 g via INTRAVENOUS
  Filled 2014-02-06: qty 50

## 2014-02-06 MED ORDER — SEVELAMER CARBONATE 800 MG PO TABS
1600.0000 mg | ORAL_TABLET | Freq: Three times a day (TID) | ORAL | Status: DC
  Administered 2014-02-07 – 2014-02-14 (×18): 1600 mg via ORAL
  Filled 2014-02-06 (×26): qty 2

## 2014-02-06 NOTE — Evaluation (Signed)
Physical Therapy Evaluation Patient Details Name: Brandi Arellano MRN: [id number] DOB: 11-19-1955 Today's Date: 02/06/2014   History of Present Illness  S/P R BKA, PMH:  DM2, ESRD, CAD, CHF, HTN, amputation of L forefoot.  Clinical Impression  Pt admitted with the above. Pt currently with functional limitations due to the deficits listed below (see PT Problem List). At the time of PT eval pt was able to perform transfers and ambulation bed to chair with min assist. Pt and brother expressing interest in CIR however pt appeared confused regarding d/c plan at beginning of session. Feel that pt will be a great candidate for CIR. Pt will benefit from skilled PT to increase their independence and safety with mobility to allow discharge to the venue listed below.      Follow Up Recommendations CIR;Supervision/Assistance - 24 hour    Equipment Recommendations  Wheelchair (measurements PT);Wheelchair cushion (measurements PT)    Recommendations for Other Services Rehab consult     Precautions / Restrictions Precautions Precautions: Fall Restrictions Weight Bearing Restrictions: No      Mobility  Bed Mobility Overal bed mobility: Needs Assistance Bed Mobility: Supine to Sit     Supine to sit: Supervision     General bed mobility comments: Pt able to transition into long sitting, and don her bedroom shoe, and then scooted hips around to EOB with supervision.   Transfers Overall transfer level: Needs assistance Equipment used: Rolling walker (2 wheeled) Transfers: Sit to/from Stand Sit to Stand: Min guard;Min assist         General transfer comment: Increased time required for pt to power-up to full standing. Close guard for safety and steadying assist initially when achieved full standing.   Ambulation/Gait Ambulation/Gait assistance: Min assist Ambulation Distance (Feet): 6 Feet Assistive device: Rolling walker (2 wheeled) Gait Pattern/deviations: Step-to pattern;Decreased  stride length;Trunk flexed Gait velocity: Decreased Gait velocity interpretation: Below normal speed for age/gender General Gait Details: Pt was able to take steps to recliner chair with assist for balance and walker positioning. Pt reports she is very fatigued after reaching chair.   Stairs            Wheelchair Mobility    Modified Rankin (Stroke Patients Only)       Balance Overall balance assessment: Needs assistance Sitting-balance support: Feet supported;No upper extremity supported Sitting balance-Leahy Scale: Fair     Standing balance support: Bilateral upper extremity supported;During functional activity Standing balance-Leahy Scale: Poor Standing balance comment: Requires UE support to maintain balance                             Pertinent Vitals/Pain Pain Assessment: No/denies pain Pain Intervention(s): Premedicated before session    Home Living Family/patient expects to be discharged to:: Private residence Living Arrangements: Other relatives (Brother) Available Help at Discharge: Family;Available 24 hours/day Type of Home: House Home Access: Stairs to enter Entrance Stairs-Rails: Right;Left;Can reach both Entrance Stairs-Number of Steps: 2 Home Layout: One level Home Equipment: Walker - 2 wheels Additional Comments: Tub shower with curtain    Prior Function Level of Independence: Independent         Comments: did not require AD to walk     Hand Dominance   Dominant Hand: Right    Extremity/Trunk Assessment   Upper Extremity Assessment: Defer to OT evaluation           Lower Extremity Assessment: RLE deficits/detail;LLE deficits/detail RLE Deficits / Details: Decreased strength and  AROM consistent with BKA LLE Deficits / Details: Decreased overall standing balance due to forefoot amputation.   Cervical / Trunk Assessment: Normal  Communication   Communication: No difficulties  Cognition Arousal/Alertness:  Awake/alert Behavior During Therapy: WFL for tasks assessed/performed Overall Cognitive Status: History of cognitive impairments - at baseline (Brother states she gets confused at times at baseline)       Memory: Decreased recall of precautions              General Comments General comments (skin integrity, edema, etc.): Pt education for positioning    Exercises Amputee Exercises Quad Sets: 10 reps;Both      Assessment/Plan    PT Assessment Patient needs continued PT services  PT Diagnosis Difficulty walking;Abnormality of gait   PT Problem List Decreased strength;Decreased range of motion;Decreased activity tolerance;Decreased balance;Decreased mobility;Decreased knowledge of use of DME;Decreased safety awareness;Decreased knowledge of precautions;Pain  PT Treatment Interventions DME instruction;Gait training;Stair training;Functional mobility training;Therapeutic activities;Therapeutic exercise;Neuromuscular re-education;Patient/family education   PT Goals (Current goals can be found in the Care Plan section) Acute Rehab PT Goals Patient Stated Goal: Regain independence. PT Goal Formulation: With patient/family Time For Goal Achievement: 02/20/14 Potential to Achieve Goals: Good    Frequency Min 3X/week   Barriers to discharge        Co-evaluation               End of Session Equipment Utilized During Treatment: Gait belt Activity Tolerance: Patient limited by fatigue Patient left: in chair;with call bell/phone within reach Nurse Communication: Mobility status         Time: 9622-2979 PT Time Calculation (min): 28 min   Charges:   PT Evaluation $Initial PT Evaluation Tier I: 1 Procedure PT Treatments $Gait Training: 8-22 mins $Therapeutic Activity: 8-22 mins   PT G Codes:          Rolinda Roan 02/06/2014, 10:01 AM  Rolinda Roan, PT, DPT Acute Rehabilitation Services Pager: [phone]

## 2014-02-06 NOTE — Progress Notes (Signed)
  I have seen and examined the patient, and reviewed the daily progress note by Wyatt Mage, MS 3 and discussed the care of the patient with them. Please see my progress note from 02/06/2014 for further details regarding assessment and plan.    Signed:  Charlott Rakes, MD 02/06/2014, 5:25 PM

## 2014-02-06 NOTE — Procedures (Signed)
RIJV HD catheter exchange 23 cm Tip RA No comp

## 2014-02-06 NOTE — Progress Notes (Signed)
Pt catheter will not tolerate HD tx. MD aware. 90 mins left when tx ended. Pt NPO and to have catheter evaluation. Kizzie Ide, 6E RN aware. Called report. Alert, vss.

## 2014-02-06 NOTE — Progress Notes (Addendum)
Subjective: No complaints this AM. She feels her nausea has greatly improved. PT recommended CIR though CIR consult recommended HH PT. SW is working on SNF placement; she has had no bed offers here in [facility].  Objective: Vital signs in last 24 hours: Filed Vitals:   02/06/14 1010 02/06/14 1030 02/06/14 1100 02/06/14 1145  BP: 126/62 106/62 138/59 124/56  Pulse: 85 87 102 107  Temp:    98 F (36.7 C)  TempSrc:    Oral  Resp: 14 15 15 17   Height:      Weight:    116 lb 6.5 oz (52.8 kg)  SpO2:    100%   Weight change: 5 lb 1.1 oz (2.299 kg)  Intake/Output Summary (Last 24 hours) at 02/06/14 1330 Last data filed at 02/06/14 1145  Gross per 24 hour  Intake    360 ml  Output   1059 ml  Net   -699 ml   General: sitting up in bed, smiling HEENT: EOMI, no scleral icterus Cardiac: RRR, no rubs, murmurs or gallops Pulm: clear to auscultation bilaterally Abd: soft, nontender, nondistended, BS present Ext: right BKA, bandage covered/dry/intact  Lab Results: Basic Metabolic Panel:  Recent Labs Lab 02/04/14 0550 02/05/14 1343 02/06/14 1015  NA 138 133* 135*  K 4.1 4.0 3.9  CL 95* 91* 94*  CO2 16* 19 21  GLUCOSE 116* 176* 127*  BUN 59* 72* 45*  CREATININE 8.37* 11.04* 8.26*  CALCIUM 8.5 7.7* 8.4  PHOS 10.4* 10.9*  --    CBG:  Recent Labs Lab 02/04/14 2036 02/05/14 0749 02/05/14 1151 02/05/14 1715 02/05/14 2057 02/06/14 0759  GLUCAP 104* 88 185* 129* 206* 92    Studies/Results: No results found. Medications: I have reviewed the patient's current medications. Scheduled Meds: .  ceFAZolin (ANCEF) IV  2 g Intravenous On Call  . cinacalcet  30 mg Oral Q breakfast  . [START ON 02/07/2014] darbepoetin (ARANESP) injection - DIALYSIS  60 mcg Intravenous Q Thu-HD  . doxercalciferol  1 mcg Intravenous Q T,Th,Sa-HD  . feeding supplement (RESOURCE BREEZE)  1 Container Oral BID BM  . ferric gluconate (FERRLECIT/NULECIT) IV  125 mg Intravenous Q Thu-HD  .  insulin aspart  0-9 Units Subcutaneous TID WC  . insulin detemir  9 Units Subcutaneous QHS  . levothyroxine  25 mcg Oral QAC breakfast  . metoCLOPramide  5 mg Oral TID AC  . multivitamin  1 tablet Oral QHS  . OxyCODONE  15 mg Oral Q12H  . pantoprazole  40 mg Oral BID  . senna-docusate  1 tablet Oral BID  . sodium chloride  3 mL Intravenous Q12H   Continuous Infusions:  PRN Meds:.hydrALAZINE, oxyCODONE, promethazine, promethazine Assessment/Plan: Principal Problem:   Ischemic ulcer of right foot, limited to breakdown of skin Active Problems:   End stage renal disease   Essential hypertension   DM type 2, uncontrolled, with renal complications   Hypothyroidism   Hyperparathyroidism, secondary renal   Hyperphosphatemia   Anemia in chronic kidney disease  Ms. 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   Improved nausea likely 2/2 diabetic gastroparesis: Improved on Reglan 5mg  TID making diabetic gastroparesis likely.  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 ABG shows mild metabolic acidosis without expected respiratory compensation. Stable otherwise.  ESRD on HD: She did not receive dialysis and will go today. -Restart Sevelamer 1600mg  TID as she is tolerating PO intake -Per renal, she  will need to change her catheter tomorrow -Renal following, appreciate recs  Anemia of chronic disease: Hb 7.9, down from 8. Hb 10 on admission. Defer tomorrow's CBC.  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  CAD: Restart ASA per Ortho recs.  Diastolic CHF: Stable for now.  HTN: BP trending 100s/60s off meds. Stable.  DM2: CBGs well controlled -Continue Levermir 9u QHS & SSI-S.  Hypothyroidism: Continue home meds.  FEN:  -Diet: Renal/Carb Modified  DVT prophylaxis: SCDs  CODE STATUS: FULL CODE  Dispo: Disposition is deferred at this time, awaiting improvement of current medical problems.  Need improvement of nausea and eval by PT and OT.  The patient does not have a current PCP (No primary provider on file.) and does not need an [facility] hospital follow-up appointment after discharge.  The patient does have transportation limitations that hinder transportation to clinic appointments.  .Services Needed at time of discharge: Y = Yes, Blank = No PT: CIR  OT: CIR  RN:   Equipment: 3-in-1 bedside commode, wheelchair  Other:     LOS: 10 days   Charlott Rakes, MD 02/06/2014, 1:30 PM

## 2014-02-06 NOTE — Progress Notes (Signed)
  Date: 02/06/2014  Patient name: Brandi Arellano  Medical record number: [id number]  Date of birth: [birth year]   This patient has been seen and the plan of care was discussed with the house staff. Please see their note for complete details. I concur with their findings with the following additions/corrections:  Per CIR review, patient appropriate for home health PT, however, PT team is recommending inpatient rehab.  SNF evaluation and bed search is ongoing.  Discussion with family about desire and goals will be needed.  If she can go home with home support by family and [facility] PT, she could be discharged to home.   Sid Falcon, MD 02/06/2014, 3:12 PM

## 2014-02-06 NOTE — Procedures (Signed)
I was present at this dialysis session. I have reviewed the session itself and made appropriate changes.   Pt had o/n tPA dwell and despite this still with very poor blood flow.  Lines switched as well w/o improvement.    Pt eating more, feeling better.  Will ask IR to assist with [facility] exchange.  Pearson Grippe  MD 02/06/2014, 11:14 AM

## 2014-02-06 NOTE — Progress Notes (Signed)
Subjective: Patient laying in bed, much more verbal today, no complaints of nausea or vomiting. Objective: Vital signs in last 24 hours: Filed Vitals:   02/06/14 1010 02/06/14 1030 02/06/14 1100 02/06/14 1145  BP: 126/62 106/62 138/59 124/56  Pulse: 85 87 102 107  Temp:    98 F (36.7 C)  TempSrc:    Oral  Resp: 14 15 15 17   Height:      Weight:    52.8 kg (116 lb 6.5 oz)  SpO2:    100%   Weight change: 2.299 kg (5 lb 1.1 oz)  Intake/Output Summary (Last 24 hours) at 02/06/14 1352 Last data filed at 02/06/14 1145  Gross per 24 hour  Intake    360 ml  Output   1059 ml  Net   -699 ml   Physical Exam:  Neuro: Pain well controlled, AAOx3  CV: RRR, systolic murmur  Pulm: CTAB, normal work of breathing  Abd: Tender to palpation, no rebound or guarding  Ext: Right lower extremity s/p BKA and dressed, left lower extremity warm and well perfused without edema, bilateral upper extremities warm and well perfused with 3+ pulses  Lab Results: Results for Brandi, Arellano (MRN [id number]) as of 02/05/2014 11:17  Ref. Range 02/04/2014 19:58  Sample type No range found VENOUS  pH, Ven Latest Range: 7.250-7.300  7.244 (L)  pCO2, Ven Latest Range: 45.0-50.0 mmHg 44.5 (L)  pO2, Ven Latest Range: 30.0-45.0 mmHg 182.0 (H)  Bicarbonate Latest Range: 20.0-24.0 mEq/L 18.6 (L)  TCO2 Latest Range: 0-100 mmol/L 19.9  Acid-base deficit Latest Range: 0.0-2.0 mmol/L 7.5 (H)  O2 Saturation No range found 98.7  Patient temperature No range found 98.6   Micro Results: Recent Results (from the past 240 hour(s))  CULTURE, BLOOD (ROUTINE X 2)     Status: None   Collection Time    01/27/14  5:05 PM      Result Value Ref Range Status   Specimen Description BLOOD LEFT FOREARM   Final   Special Requests BOTTLES DRAWN AEROBIC ONLY 4CC   Final   Culture  Setup Time     Final   Value: 01/27/2014 22:34     Performed at [facility]   Culture     Final   Value: NO GROWTH 5 DAYS     Performed at  [facility]   Report Status 02/02/2014 FINAL   Final  CULTURE, BLOOD (ROUTINE X 2)     Status: None   Collection Time    01/27/14  5:22 PM      Result Value Ref Range Status   Specimen Description BLOOD RIGHT HAND   Final   Special Requests BOTTLES DRAWN AEROBIC AND ANAEROBIC 5CC EA   Final   Culture  Setup Time     Final   Value: 01/27/2014 22:33     Performed at [facility]   Culture     Final   Value: NO GROWTH 5 DAYS     Performed at [facility]   Report Status 02/02/2014 FINAL   Final  MRSA PCR SCREENING     Status: None   Collection Time    01/28/14  4:24 AM      Result Value Ref Range Status   MRSA by PCR NEGATIVE  NEGATIVE Final   Comment:            The GeneXpert MRSA Assay (FDA     approved for NASAL specimens     only),  is one component of a     comprehensive MRSA colonization     surveillance program. It is not     intended to diagnose MRSA     infection nor to guide or     monitor treatment for     MRSA infections.  SURGICAL PCR SCREEN     Status: None   Collection Time    01/31/14  9:52 PM      Result Value Ref Range Status   MRSA, PCR NEGATIVE  NEGATIVE Final   Staphylococcus aureus NEGATIVE  NEGATIVE Final   Comment:            The Xpert SA Assay (FDA     approved for NASAL specimens     in patients over 66 years of age),     is one component of     a comprehensive surveillance     program.  Test performance has     been validated by [employer] for patients greater     than or equal to 45 year old.     It is not intended     to diagnose infection nor to     guide or monitor treatment.   Medications: Scheduled Meds: .  ceFAZolin (ANCEF) IV  2 g Intravenous On Call  . cinacalcet  30 mg Oral Q breakfast  . [START ON 02/07/2014] darbepoetin (ARANESP) injection - DIALYSIS  60 mcg Intravenous Q Thu-HD  . doxercalciferol  1 mcg Intravenous Q T,Th,Sa-HD  . feeding supplement (RESOURCE BREEZE)  1 Container Oral BID BM  .  ferric gluconate (FERRLECIT/NULECIT) IV  125 mg Intravenous Q Thu-HD  . insulin aspart  0-9 Units Subcutaneous TID WC  . insulin detemir  9 Units Subcutaneous QHS  . levothyroxine  25 mcg Oral QAC breakfast  . metoCLOPramide  5 mg Oral TID AC  . multivitamin  1 tablet Oral QHS  . OxyCODONE  15 mg Oral Q12H  . pantoprazole  40 mg Oral BID  . senna-docusate  1 tablet Oral BID  . sevelamer carbonate  1,600 mg Oral TID WC  . sodium chloride  3 mL Intravenous Q12H   Continuous Infusions:  PRN Meds:.hydrALAZINE, oxyCODONE, promethazine, promethazine Assessment/Plan:  Brandi Arellano is a 58 yo female with a history of DMII, ESRD on HD T/R/S, HTN, hypothyroidism who presents with right foot ischemic ulcer, possible osteomyelitis suspicious for Buerger's disease and incidentally found to have stenosing flexor synovitis of left 3rd digit.  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  * S/P BKA now POD5, to follow up with Dr Sharol Given next week for dressing removal * Pain control with oxycontin 15mg  q12h and oxycodone 5mg  q6h prn  * PT consulted, notes on file * Patient not eligible for inpatient rehab, case worker consulted for SNF  Nausea: Well controlled today, able to eat a full breakfast without symptoms.  * Metoclopramide 5mg  TID with meals   ESRD: Nephro following, HD T/R/S.  * Patient's catheter with poor flow, unable to complete HD for second day. Nephro consulted VIR and patient will have catheter placed, likely tomorrow as patient not NPO. * Sensipar 30mg  qday   Increased anion gap: Present on admission, trending up concurrent with down trending bicarbonate.  * ABG/BMP demonstrate anion gap metabolic acidosis with metabolic alkalosis - difficult to assess  significance in setting of poor HD due to catheter  dysfunction  Hyponatremia: Present on admission. Will continue to monitor with daily metabolic panels.   HTN: Normotensive on the floor.  * Hydralazine prn  * Hold home meds   DMII: Blood glucose well controlled in low 100s recently, elevated in low 200s early in hospital stay and levemir increased from 3U to 9U qPM.  * Continue levemir  * Continue SSI-S   CAD:  * Will restart ASA 81mg  when ok by ortho   Anemia of chronic disease: Continue to follow CBCs.  Hypothyroidism:  * Continue home meds   Stenosing flexor synovitis of left 3rd finger: Asymptomatic.  * Will f/u with ortho as outpatient   FENGI:  * Diet - Renal/Carb modified  * Protonix  * Senekot   DVT:  * SCD  Dispo: Discharge to SNF pending catheter placement by VIR and HD.  This is a Careers information officer Note.  The care of the patient was discussed with Dr. Posey Pronto and the assessment and plan formulated with their assistance.  Please see their attached note for official documentation of the daily encounter.   LOS: 10 days   Jerene Dilling, Med Student 02/06/2014, 1:52 PM

## 2014-02-07 LAB — RENAL FUNCTION PANEL
Albumin: 2.6 g/dL — ABNORMAL LOW (ref 3.5–5.2)
Anion gap: 19 — ABNORMAL HIGH (ref 5–15)
BUN: 37 mg/dL — ABNORMAL HIGH (ref 6–23)
CO2: 22 mEq/L (ref 19–32)
Calcium: 7.9 mg/dL — ABNORMAL LOW (ref 8.4–10.5)
Chloride: 93 mEq/L — ABNORMAL LOW (ref 96–112)
Creatinine, Ser: 7.57 mg/dL — ABNORMAL HIGH (ref 0.50–1.10)
GFR calc Af Amer: 6 mL/min — ABNORMAL LOW (ref >90)
GFR calc non Af Amer: 5 mL/min — ABNORMAL LOW (ref >90)
Glucose, Bld: 106 mg/dL — ABNORMAL HIGH (ref 70–99)
Phosphorus: 7.8 mg/dL — ABNORMAL HIGH (ref 2.3–4.6)
Potassium: 3.8 mEq/L (ref 3.7–5.3)
Sodium: 134 mEq/L — ABNORMAL LOW (ref 137–147)

## 2014-02-07 LAB — CBC
HCT: 21.9 % — ABNORMAL LOW (ref 36.0–46.0)
Hemoglobin: 7.1 g/dL — ABNORMAL LOW (ref 12.0–15.0)
MCH: 30.3 pg (ref 26.0–34.0)
MCHC: 32.4 g/dL (ref 30.0–36.0)
MCV: 93.6 fL (ref 78.0–100.0)
Platelets: 193 10*3/uL (ref 150–400)
RBC: 2.34 MIL/uL — ABNORMAL LOW (ref 3.87–5.11)
RDW: 14.8 % (ref 11.5–15.5)
WBC: 9.6 10*3/uL (ref 4.0–10.5)

## 2014-02-07 LAB — GLUCOSE, CAPILLARY
GLUCOSE-CAPILLARY: 206 mg/dL — AB (ref 70–99)
Glucose-Capillary: 78 mg/dL (ref 70–99)
Glucose-Capillary: 155 mg/dL — ABNORMAL HIGH (ref 70–99)

## 2014-02-07 LAB — BASIC METABOLIC PANEL
ANION GAP: 22 — AB (ref 5–15)
BUN: 35 mg/dL — AB (ref 6–23)
CHLORIDE: 92 meq/L — AB (ref 96–112)
CO2: 19 mEq/L (ref 19–32)
Calcium: 8.1 mg/dL — ABNORMAL LOW (ref 8.4–10.5)
Creatinine, Ser: 7.22 mg/dL — ABNORMAL HIGH (ref 0.50–1.10)
GFR, EST AFRICAN AMERICAN: 7 mL/min — AB (ref >90)
GFR, EST NON AFRICAN AMERICAN: 6 mL/min — AB (ref >90)
Glucose, Bld: 91 mg/dL (ref 70–99)
Potassium: 3.9 mEq/L (ref 3.7–5.3)
Sodium: 133 mEq/L — ABNORMAL LOW (ref 137–147)

## 2014-02-07 MED ORDER — DARBEPOETIN ALFA-POLYSORBATE 60 MCG/0.3ML IJ SOLN
INTRAMUSCULAR | Status: AC
  Filled 2014-02-07: qty 0.3

## 2014-02-07 MED ORDER — NEPRO/CARBSTEADY PO LIQD: 237.0000 mL | ORAL | Status: DC | PRN

## 2014-02-07 MED ORDER — SODIUM CHLORIDE 0.9 % IV SOLN: 100.0000 mL | INTRAVENOUS | Status: DC | PRN

## 2014-02-07 MED ORDER — HEPARIN SODIUM (PORCINE) 1000 UNIT/ML DIALYSIS: 1000.0000 [IU] | INTRAMUSCULAR | Status: DC | PRN

## 2014-02-07 MED ORDER — LIDOCAINE-PRILOCAINE 2.5-2.5 % EX CREA: 1.0000 "application " | TOPICAL_CREAM | CUTANEOUS | Status: DC | PRN

## 2014-02-07 MED ORDER — DOXERCALCIFEROL 4 MCG/2ML IV SOLN
INTRAVENOUS | Status: AC
  Filled 2014-02-07: qty 2

## 2014-02-07 MED ORDER — OXYCODONE HCL 5 MG PO TABS
ORAL_TABLET | ORAL | Status: AC
  Filled 2014-02-07: qty 1

## 2014-02-07 MED ORDER — PENTAFLUOROPROP-TETRAFLUOROETH EX AERO: 1.0000 "application " | INHALATION_SPRAY | CUTANEOUS | Status: DC | PRN

## 2014-02-07 MED ORDER — LIDOCAINE HCL (PF) 1 % IJ SOLN: 5.0000 mL | INTRAMUSCULAR | Status: DC | PRN

## 2014-02-07 MED ORDER — ALTEPLASE 2 MG IJ SOLR: 2.0000 mg | Freq: Once | INTRAMUSCULAR | Status: DC | PRN

## 2014-02-07 NOTE — Procedures (Signed)
I was present at this dialysis session. I have reviewed the session itself and made appropriate changes.   Pearson Grippe  MD 02/07/2014, 11:01 AM

## 2014-02-07 NOTE — Progress Notes (Signed)
I saw the patient and agree with the above assessment and plan.    Doing well with new TDC.  Qb 400.  N/V remains improved.  Perhaps to CIR?

## 2014-02-07 NOTE — Progress Notes (Signed)
Subjective: Patient laying in bed eating breakfast, no acute distress. Objective: Vital signs in last 24 hours: Filed Vitals:   02/07/14 0757 02/07/14 1027 02/07/14 1037 02/07/14 1100  BP: 116/54 134/75 116/65 103/57  Pulse: 87 91 88 89  Temp: 98.6 F (37 C) 97.3 F (36.3 C)    TempSrc: Oral Oral    Resp: 17 11 11    Height:      Weight:  53.4 kg (117 lb 11.6 oz)    SpO2: 96% 98%     Weight change: -0.8 kg (-1 lb 12.2 oz)  Intake/Output Summary (Last 24 hours) at 02/07/14 1122 Last data filed at 02/06/14 2047  Gross per 24 hour  Intake    120 ml  Output    536 ml  Net   -416 ml   Physical Exam:  Neuro: Pain well controlled, AAOx3  CV: RRR, systolic murmur  Pulm: CTAB, normal work of breathing  Abd: Tender to palpation, no rebound or guarding  Ext: Right lower extremity s/p BKA and dressed, left lower extremity warm and well perfused without edema, bilateral upper extremities warm and well perfused with 3+ pulses  Lab Results: Results for Brandi, Arellano (MRN [id number]) as of 02/05/2014 11:17  Ref. Range 02/04/2014 19:58  Sample type No range found VENOUS  pH, Ven Latest Range: 7.250-7.300  7.244 (L)  pCO2, Ven Latest Range: 45.0-50.0 mmHg 44.5 (L)  pO2, Ven Latest Range: 30.0-45.0 mmHg 182.0 (H)  Bicarbonate Latest Range: 20.0-24.0 mEq/L 18.6 (L)  TCO2 Latest Range: 0-100 mmol/L 19.9  Acid-base deficit Latest Range: 0.0-2.0 mmol/L 7.5 (H)  O2 Saturation No range found 98.7  Patient temperature No range found 98.6   Micro Results: Recent Results (from the past 240 hour(s))  SURGICAL PCR SCREEN     Status: None   Collection Time    01/31/14  9:52 PM      Result Value Ref Range Status   MRSA, PCR NEGATIVE  NEGATIVE Final   Staphylococcus aureus NEGATIVE  NEGATIVE Final   Comment:            The Xpert SA Assay (FDA     approved for NASAL specimens     in patients over 42 years of age),     is one component of     a comprehensive surveillance     program.  Test  performance has     been validated by [employer] for patients greater     than or equal to 14 year old.     It is not intended     to diagnose infection nor to     guide or monitor treatment.   Medications: Scheduled Meds: . cinacalcet  30 mg Oral Q breakfast  . darbepoetin (ARANESP) injection - DIALYSIS  60 mcg Intravenous Q Thu-HD  . doxercalciferol  1 mcg Intravenous Q T,Th,Sa-HD  . feeding supplement (RESOURCE BREEZE)  1 Container Oral BID BM  . ferric gluconate (FERRLECIT/NULECIT) IV  125 mg Intravenous Q Thu-HD  . insulin aspart  0-9 Units Subcutaneous TID WC  . insulin detemir  9 Units Subcutaneous QHS  . levothyroxine  25 mcg Oral QAC breakfast  . metoCLOPramide  5 mg Oral TID AC  . multivitamin  1 tablet Oral QHS  . pantoprazole  40 mg Oral BID  . senna-docusate  1 tablet Oral BID  . sevelamer carbonate  1,600 mg Oral TID WC  . sodium chloride  3 mL Intravenous Q12H  Continuous Infusions:  PRN Meds:.sodium chloride, sodium chloride, alteplase, feeding supplement (NEPRO CARB STEADY), heparin, hydrALAZINE, lidocaine (PF), lidocaine-prilocaine, oxyCODONE, pentafluoroprop-tetrafluoroeth, promethazine, promethazine Assessment/Plan:  Brandi Arellano is a 58 yo female with a history of DMII, ESRD on HD T/R/S, HTN, hypothyroidism who presents with right foot ischemic ulcer, possible osteomyelitis suspicious for Buerger's disease and incidentally found to have stenosing flexor synovitis of left 3rd digit.  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  * S/P BKA now POD6, to follow up with Dr Sharol Given next week for dressing removal and evaluation of left hand * Pain control with oxycodone 5mg  q6h prn  * PT consulted, notes on file * Social work working to find SNF for discharge today  Nausea: Well controlled. *  Metoclopramide 5mg  TID with meals   ESRD: Nephro following, HD T/R/S.  * Radiology placed new catheter yesterday after unsuccessful hemodialysis due to poor catheter flow, will have hemodialysis prior to discharge * Sevelamer restarted now that patient can tolerate PO  Increased anion gap: Present on admission, likely secondary to CKD.  * ABG/BMP demonstrate anion gap metabolic acidosis with metabolic alkalosis - difficult to assess significance in setting of poor HD due to catheter dysfunction  Hyponatremia: Present on admission. Will continue to monitor with daily metabolic panels.  HTN: Normotensive on the floor. * Hydralazine prn  * Hold home meds   DMII: Blood glucose well controlled in low 100s recently, elevated in low 200s early in hospital stay and levemir increased from 3U to 9U qPM.  * Continue levemir * Continue SSI-S  CAD:  * Will restart ASA 81mg  when ok by ortho   Anemia of chronic disease: Continue to follow CBCs.  Hypothyroidism:  * Continue home meds   Stenosing flexor synovitis of left 3rd finger: Asymptomatic.  * Will f/u with ortho as outpatient   FENGI:  * Diet - Renal/Carb modified  * Protonix  * Senekot   DVT:  * SCD  Dispo: Discharge to SNF pending placement.  This is a Careers information officer Note.  The care of the patient was discussed with Dr. Posey Pronto and the assessment and plan formulated with their assistance.  Please see their attached note for official documentation of the daily encounter.   LOS: 11 days   Jerene Dilling, Med Student 02/07/2014, 11:22 AM

## 2014-02-07 NOTE — Discharge Summary (Signed)
Name: Brandi Arellano MRN: [id number] DOB: 07/08/1955 58 y.o. PCP: No primary care provider on file.  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     cinacalcet 30 MG tablet  Commonly known as:  SENSIPAR  Take 1 tablet (30 mg total) by mouth daily with breakfast.     feeding supplement (RESOURCE BREEZE) Liqd  Take 1 Container by mouth 2 (two) times daily between meals.     insulin aspart 100 UNIT/ML injection  Commonly known as:  novoLOG  Inject 0-15 Units into the skin 3 (three) times daily with meals.     insulin detemir 100 UNIT/ML injection  Commonly known as:  LEVEMIR  Inject 0.09 mLs (9 Units total) into the skin at bedtime.     levothyroxine 25 MCG tablet  Commonly known as:  SYNTHROID, LEVOTHROID  Take 25 mcg by mouth daily before breakfast.     metoCLOPramide 5 MG tablet  Commonly known as:  REGLAN  Take 1 tablet (5 mg total) by mouth 3 (three) times daily before meals. Don't take after 02/16/14.     multivitamin Tabs tablet  Take 1 tablet by mouth at bedtime.     oxyCODONE 5 MG immediate release tablet  Commonly known as:  Oxy IR/ROXICODONE  Take 1 tablet (5 mg total) by mouth every 6 (six) hours as needed for severe pain or breakthrough pain.     pantoprazole 40 MG tablet  Commonly known as:  PROTONIX  Take 1 tablet (40 mg  total) by mouth daily.     sevelamer carbonate 800 MG tablet  Commonly known as:  RENVELA  Take 2 tablets (1,600 mg total) by mouth 3 (three) times daily with meals.        Disposition and follow-up:   [name] was discharged from [facility] in Stable condition.  At the hospital follow up visit please address:  1.  DM2: glycemic control  2.  HTN: BP control  3.  Wound care  4.  Labs / imaging needed at time of follow-up: A1c, BMET, gastric emptying study  5.  Pending labs/ test needing follow-up: none  Follow-up Appointments: Follow-up Information    Follow up with Newt Minion, MD On 02/21/2014.   Specialty:  Orthopedic Surgery   Why:  1PM   Contact information:   [place] [address] [phone]       Follow up with Cresenciano Genre, MD On 02/19/2014.   Specialty:  Internal Medicine   Why:  245PM   Contact information:   [place] [address] [phone]       Discharge Instructions: Discharge Instructions    Call MD for:  persistant nausea and vomiting    Complete by:  As directed      Call  MD for:  severe uncontrolled pain    Complete by:  As directed      Call MD for:  temperature >100.4    Complete by:  As directed      Diet renal/carb modified with 1200 ml fluid restriction    Complete by:  As directed      Leave dressing on - Keep it clean, dry, and intact until clinic visit    Complete by:  As directed            Consultations: Treatment Team:  Sol Blazing, MD  Procedures Performed:  Mr Foot Right Wo Contrast  01/28/2014   CLINICAL DATA:  Open wound on the lateral aspect of the right foot since last Monday. Stepped on wood chips.  EXAM: MRI OF THE RIGHT FOREFOOT WITHOUT CONTRAST  TECHNIQUE: Multiplanar, multisequence MR imaging was performed. No intravenous contrast was administered.  COMPARISON:  Radiographs 01/27/2014  FINDINGS: Examination is extremely limited due to  patient motion. The patient would not or could not hold still for the exam.  There is a large fluid collection surrounding the fifth metatarsal worrisome for an abscess. There is also signal abnormality in the fifth metatarsal shaft suspicious for osteomyelitis. No obvious changes of septic arthritis.  IMPRESSION: Very limited examination but findings consistent with an abscess surrounding the fifth metatarsal and fifth metatarsal osteomyelitis.   Electronically Signed   By: Kalman Jewels M.D.   On: 01/28/2014 17:12   Ir Fluoro Guide Cv Line Right  02/06/2014   ADDENDUM REPORT: 02/06/2014 16:18  ADDENDUM: Please note the following correction:  The patient was prepped and draped with Hibiclens. Iodine products were not utilized.   Electronically Signed   By: Maryclare Bean M.D.   On: 02/06/2014 16:18   02/06/2014   CLINICAL DATA:  Dialysis catheter not functioning  EXAM: IR RIGHT FLOURO GUIDE CV LINE  FLUOROSCOPY TIME:  12 seconds.  MEDICATIONS AND MEDICAL HISTORY: None  ANESTHESIA/SEDATION: None  CONTRAST:  None  PROCEDURE: The procedure, risks, benefits, and alternatives were explained to the patient. Questions regarding the procedure were encouraged and answered. The patient understands and consents to the procedure.  The right chest was prepped with Betadine in a sterile fashion, and a sterile drape was applied covering the operative field. A sterile gown and sterile gloves were used for the procedure.  The existing catheter was localized. 1% lidocaine was utilized to infiltrate the entry site. Utilizing blunt dissection, the cuff was freed from the subcutaneous tissue. It was exchanged over a stiff glidewire for a new 23 cm tip to cuff tunneled dialysis catheter. It was flushed and sewn in place within 0 Prolene pursestring knot.  FINDINGS: The tip of the dialysis catheter is in the right atrium.  COMPLICATIONS: None  IMPRESSION: Successful tunneled dialysis catheter exchange.  Electronically Signed: By: Maryclare Bean M.D. On: 02/06/2014 16:14   Dg Foot Complete Right  01/27/2014   CLINICAL DATA:  58 year old female with previous amputation of the right great toe. Swelling redness an ulcer on the lateral side of the right foot.  EXAM: RIGHT FOOT COMPLETE - 3+ VIEW  COMPARISON:  None.  FINDINGS: No acute bony abnormality identified. Healed postsurgical changes of prior right great toe and partial first ray amputation. 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  Subcutaneous gas and soft tissue swelling present overlying the fifth metatarsal. Small amount of gas overlying the fifth metatarsophalangeal joint.  Diffuse osteopenia.  Extensive calcifications of the posterior  tibial artery, common plantar artery, and lateral plantar arch.  IMPRESSION: No acute bony abnormality.  Soft tissue swelling and subcutaneous gas adjacent to the fifth metatarsophalangeal joint and overlying the fifth metatarsal diaphysis, consistent with the given history of ulcer and inflammation/infection.  Atherosclerosis.  Surgical changes of prior left great toe amputation and partial first ray amputation, healed.  Signed,  Dulcy Fanny. Earleen Newport, DO  [facility]  [facility]   Electronically Signed   By: Corrie Mckusick D.O.   On: 01/27/2014 18:19    Admission HPI: Ms. Vink is a 58 year old woman with history of CAD, Diastolic CHF, HTN, DM2, ESRD on HD TThS, hypothyroidism, chronic anemia, thromocytopenia, DVT presenting with R foot wound. She stepped on wood chips on Monday. Since then she has had increased pain. She has tried soaking her foot in peroxide and applying neosporin with minimal improvement. She denies bleeding or drainage. Denies falling. She denies fevers, chills, chest pain, shortness of breath, cough, nausea, vomiting, abdominal pain, diarrhea, hematochezia, melena, dysuria, hematuria, rash, edema, arthralgias, paresthesias, LH, HA.   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 She was empirically started on Vancomycin and Zosyn. Blood cultures were unremarkable. Orthopedics and Vascular Surgery were consulted for possible amputation in the setting of poorly controlled vascular disease. 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 R foot MRI showed an abscess adjacent to her fifth metatarsal and findings concerning for osteomyelitis. Orthopedics performed BKA on 10/23 which was completed without complication. Antibiotics were continued for two days postoperatively for a total of 7 days. Her pain was well controlled with oxycodone IR and will need to follow-up with Orthopedics on 11/12.  ESRD: She received dialysis concordant with her outpatient schedule. Due to poor catheter flow and ineligible candidate for regrafting, she had a new catheter placed. Also of note, she was nauseated and had episodes of emesis in the post operative period and was unable to tolerate sevelamer. Her phosphorus trended up and she was converted to cinacalcet. When her symptoms subsided she was restarted on sevelamer.  Presumed diabetic gastroparesis: On admission, she had nausea and vomiting which worsened post-operatively and was refractory to Zofran and Phenargan. She was started on Reglan 5mg  TID  which improved her appetite. She will be discharged on this dose to complete a total 14-day course of therapy and will need a gastric emptying study at some point as an outpatient to confirm the diagnosis.  DM2: CBGs on admission trended >100. Her home levemir dose of 3 units was increased to 9 units after which CBGs trended 100s. She will need to establish care with a PCP for further glycemic control.   Anemia of chronic disease: Likely 2/2 ESRD. She received Aranesp on 10/29. Hb was stable at 9-10 at the time of discharge.  CAD: ASA was held post operatively and is to be resumed at the time of discharge.  HTN: She was normotensive during her admission, so home blood pressure medications were held.  Hypothyroidism: Home synthroid was continued.  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 OT recommended she follow-up with Orthopedics as an outpatient to be evaluated.  Discharge Vitals:   BP 134/69 mmHg  Pulse 78  Temp(Src) 98.2 F (36.8 C) (Oral)  Resp 18  Ht 5\' 3"  (1.6 m)  Wt 116 lb 10 oz (52.9 kg)  BMI 20.66 kg/m2  SpO2 96%  Discharge Labs:  Results for orders placed or performed during the hospital encounter of 01/27/14 (from the past 24 hour(s))  Glucose, capillary     Status: None   Collection Time: 02/13/14  4:03 PM  Result Value Ref Range   Glucose-Capillary 83 70 - 99 mg/dL  Glucose, capillary     Status: Abnormal   Collection Time: 02/13/14  9:03 PM  Result Value Ref Range   Glucose-Capillary 231 (H) 70 - 99 mg/dL  Glucose, capillary     Status: Abnormal   Collection Time: 02/14/14  7:47 AM  Result Value Ref Range   Glucose-Capillary 67 (L) 70 - 99 mg/dL  Glucose, capillary     Status: Abnormal   Collection Time: 02/14/14  8:26 AM  Result Value Ref Range   Glucose-Capillary 60 (L) 70 - 99 mg/dL  Glucose, capillary     Status: None   Collection Time: 02/14/14   9:39 AM  Result Value Ref Range   Glucose-Capillary 91 70 - 99 mg/dL  Glucose, capillary     Status: Abnormal   Collection Time: 02/14/14 11:48 AM  Result Value Ref Range   Glucose-Capillary 123 (H) 70 - 99 mg/dL    Signed: Charlott Rakes, MD 02/14/2014, 2:32 PM    Services Ordered on Discharge: SNF Equipment Ordered on Discharge: wheelchair, bedside commode

## 2014-02-07 NOTE — Progress Notes (Signed)
Patient off the floor at HD, unavailable for treatment. Will attempt later date.

## 2014-02-07 NOTE — Progress Notes (Signed)
Physical Therapy Treatment Patient Details Name: Brandi Arellano MRN: [id number] DOB: Aug 29, 1955 Today's Date: 02/07/2014    History of Present Illness S/P R BKA, PMH:  DM2, ESRD, CAD, CHF, HTN, amputation of L forefoot.    PT Comments    Pt progressing towards physical therapy goals. Pt was able to improve gait distance this session however pt continues to fatigue quickly and seated rest break was required during gait training. Continue to feel that CIR is the most appropriate d/c disposition - feel that pt is able to tolerate the amount of therapy necessary for CIR (with rest breaks), however if pt returns home, the follow-up necessary between HHPT sessions is questionable in regards to HEP and general safety.   Follow Up Recommendations  CIR;Supervision/Assistance - 24 hour     Equipment Recommendations  Wheelchair (measurements PT);Wheelchair cushion (measurements PT)    Recommendations for Other Services Rehab consult     Precautions / Restrictions Precautions Precautions: Fall Restrictions Weight Bearing Restrictions: No    Mobility  Bed Mobility Overal bed mobility: Needs Assistance Bed Mobility: Supine to Sit     Supine to sit: Supervision     General bed mobility comments: Pt able to transition into long sitting, and don her bedroom shoe, and then scooted hips around to EOB with supervision.   Transfers Overall transfer level: Needs assistance Equipment used: Rolling walker (2 wheeled) Transfers: Sit to/from Stand Sit to Stand: Min guard;Min assist         General transfer comment: Increased time required for pt to power-up to full standing. Close guard for safety and steadying assist initially when achieved full standing.   Ambulation/Gait Ambulation/Gait assistance: Min assist Ambulation Distance (Feet): 25 Feet Assistive device: Rolling walker (2 wheeled) Gait Pattern/deviations: Step-to pattern;Decreased stride length;Trunk flexed Gait velocity:  Decreased Gait velocity interpretation: Below normal speed for age/gender General Gait Details: Pt was able to ambulate in room with assist for balance and walker positioning. Therapist positioned chair with armrests across room and pt ambulated to the chair, had a seated rest break, and then ambualted back to the bed. Pt required chair to be brought around to her as she was having difficulty negotiating a turn with the RW.     Stairs            Wheelchair Mobility    Modified Rankin (Stroke Patients Only)       Balance Overall balance assessment: Needs assistance Sitting-balance support: Feet supported;No upper extremity supported Sitting balance-Leahy Scale: Fair     Standing balance support: Bilateral upper extremity supported Standing balance-Leahy Scale: Poor                      Cognition Arousal/Alertness: Awake/alert Behavior During Therapy: WFL for tasks assessed/performed Overall Cognitive Status: History of cognitive impairments - at baseline (Brother states she gets confused at times at baseline)       Memory: Decreased recall of precautions              Exercises Amputee Exercises Quad Sets: 10 reps;Both Hip ABduction/ADduction: 10 reps Straight Leg Raises: 10 reps    General Comments General comments (skin integrity, edema, etc.): Pt education for positioning      Pertinent Vitals/Pain Pain Assessment: No/denies pain Pain Intervention(s): Premedicated before session    Home Living                      Prior Function  PT Goals (current goals can now be found in the care plan section) Acute Rehab PT Goals Patient Stated Goal: Regain independence. PT Goal Formulation: With patient/family Time For Goal Achievement: 02/20/14 Potential to Achieve Goals: Good    Frequency  Min 3X/week    PT Plan Current plan remains appropriate    Co-evaluation             End of Session Equipment Utilized During  Treatment: Gait belt Activity Tolerance: Patient limited by fatigue Patient left: in chair;with call bell/phone within reach     Time: 0924-0950 PT Time Calculation (min): 26 min  Charges:  $Gait Training: 8-22 mins $Therapeutic Exercise: 8-22 mins                    G Codes:      Rolinda Roan March 03, 2014, 4:29 PM  Rolinda Roan, PT, DPT Acute Rehabilitation Services Pager: [phone]

## 2014-02-07 NOTE — Progress Notes (Signed)
[facility] Progress Note  Assessment/Plan: 1. R foot osteomyelitis/cellulitis, s/p R BKA 10/23 - s/p Vanc and Zosyn - plans to do inpt rehab 2. ESRD - TTS - Has been seen in the past for permanent vascular access, but no plan yet - poor options; K 4.1- reassess edw post surgery-close to 52kgs now.- catheter was exchanged yesterday due to flow issues; plan HD today 4 K bath K 3.9 3. Hypertension/volume - 105s - 120s - volume ok 4. Anemia - Hgb 8.9 down to 7.9  stable - ^Aranesp to 60 .q Thurs ( previously had received 40/week); Fe 54 tsat 26% ferritin 429 -weekly IV Fe started 10/22 -  5. Metabolic bone disease - Continue Hectorol -refusing binders in pill form - change to powdered renvela while here - she can continue prior renvela after d/c- repeat P today 6. Nutrition - Renal/carb motified diet. multivit Alb 2.6 - ad resource bid - need to recheck P now that on renvela powder 7. Hypothyroidism - on synthroid  8. DM - per primary. on SSI at home - need to update outpt med list at d/c 9. Pain/ nausea - per primary, concern for gastroparesis. Starting reglan - improved 10. Dispo- PT/OT- declined PT . will likely need SNF 11. Reflux sx - add PPI bid x 3 days then 1 q day 12. Hypothyroidism  Brandi Jacobson, PA-C [facility] Beeper [phone] 02/07/2014,8:40 AM  LOS: 11 days   Subjective:   Ate 2 hard boiled eggs, blueberry muffin and coffee, no GI c/o  Objective Filed Vitals:   02/06/14 1700 02/06/14 2046 02/07/14 0500 02/07/14 0757  BP: 121/59 105/53 121/61 116/54  Pulse: 93 89 90 87  Temp: 98.4 F (36.9 C) 99.1 F (37.3 C) 98.2 F (36.8 C) 98.6 F (37 C)  TempSrc: Oral Oral Oral Oral  Resp: 18 18 18 17   Height:      Weight:      SpO2: 99% 97% 98% 96%   Physical Exam General: looks good Heart: RRR 2/6 murmur Lungs: no rales Abdomen: soft Extremities: no sig edema right bka wrapped Dialysis Access: new right IJ 10/28 - echanged  Dialysis  Orders:[facility] TTS 4 hr 160 Optiflux 400/600 EDW 55 2K 2.25 Ca right I-J profile 4  Aranesp 40 last given 10/15 Hectorol 1    Additional Objective Labs: Basic Metabolic Panel:  Recent Labs Lab 02/03/14 0411 02/04/14 0550 02/05/14 1343 02/06/14 1015 02/07/14 0547  NA 139 138 133* 135* 133*  K 4.1 4.1 4.0 3.9 3.9  CL 96 95* 91* 94* 92*  CO2 18* 16* 19 21 19   GLUCOSE 110* 116* 176* 127* 91  BUN 38* 59* 72* 45* 35*  CREATININE 5.79* 8.37* 11.04* 8.26* 7.22*  CALCIUM 8.6 8.5 7.7* 8.4 8.1*  PHOS 9.0* 10.4* 10.9*  --   --    Liver Function Tests:  Recent Labs Lab 02/03/14 0411 02/04/14 0550 02/05/14 1343  ALBUMIN 2.6* 2.6* 2.6*   CBC:  Recent Labs Lab 02/02/14 0722 02/04/14 0550 02/05/14 1343 02/06/14 1015  WBC 11.7* 11.6* 11.3* 9.4  HGB 10.1* 8.9* 8.0* 7.9*  HCT 29.9* 27.4* 24.1* 24.0*  MCV 91.2 95.8 95.3 96.0  PLT 244 251 222 208  CBG:  Recent Labs Lab 02/05/14 2057 02/06/14 0759 02/06/14 1655 02/06/14 2042 02/07/14 0756  GLUCAP 206* 92 142* 217* 78   IMedications:   . cinacalcet  30 mg Oral Q breakfast  . darbepoetin (ARANESP) injection - DIALYSIS  60 mcg Intravenous Q  Thu-HD  . doxercalciferol  1 mcg Intravenous Q T,Th,Sa-HD  . feeding supplement (RESOURCE BREEZE)  1 Container Oral BID BM  . ferric gluconate (FERRLECIT/NULECIT) IV  125 mg Intravenous Q Thu-HD  . insulin aspart  0-9 Units Subcutaneous TID WC  . insulin detemir  9 Units Subcutaneous QHS  . levothyroxine  25 mcg Oral QAC breakfast  . metoCLOPramide  5 mg Oral TID AC  . multivitamin  1 tablet Oral QHS  . pantoprazole  40 mg Oral BID  . senna-docusate  1 tablet Oral BID  . sevelamer carbonate  1,600 mg Oral TID WC  . sodium chloride  3 mL Intravenous Q12H

## 2014-02-07 NOTE — Progress Notes (Signed)
  Date: 02/07/2014  Patient name: Brandi Arellano  Medical record number: [id number]  Date of birth: 08-24-1955   This patient has been seen and the plan of care was discussed with the house staff. Please see their note for complete details. I concur with their findings with the following additions/corrections:  Pending SNF placement, hopefully this week.   Sid Falcon, MD 02/07/2014, 4:22 PM

## 2014-02-07 NOTE — Progress Notes (Signed)
Subjective: No complaints this AM. She is amenable to SNF placement. She reports not requiring sedation yesterday for her catheter placement.   Objective: Vital signs in last 24 hours: Filed Vitals:   02/07/14 1330 02/07/14 1400 02/07/14 1428 02/07/14 1441  BP: 143/70 160/77 145/76 166/73  Pulse: 86  89 89  Temp:    98 F (36.7 C)  TempSrc:    Oral  Resp:    11  Height:      Weight:    113 lb 15.7 oz (51.7 kg)  SpO2:    97%   Weight change: -1 lb 12.2 oz (-0.8 kg)  Intake/Output Summary (Last 24 hours) at 02/07/14 1558 Last data filed at 02/07/14 1441  Gross per 24 hour  Intake    480 ml  Output   1280 ml  Net   -800 ml   General: sitting up in bed, smiling HEENT: EOMI, no scleral icterus Cardiac: RRR, no rubs, murmurs or gallops Pulm: clear to auscultation bilaterally Abd: soft, nontender, nondistended, BS present Ext: right BKA, bandage covered/dry/intact  Lab Results: Basic Metabolic Panel:  Recent Labs Lab 02/05/14 1343  02/07/14 0547 02/07/14 1100  NA 133*  < > 133* 134*  K 4.0  < > 3.9 3.8  CL 91*  < > 92* 93*  CO2 19  < > 19 22  GLUCOSE 176*  < > 91 106*  BUN 72*  < > 35* 37*  CREATININE 11.04*  < > 7.22* 7.57*  CALCIUM 7.7*  < > 8.1* 7.9*  PHOS 10.9*  --   --  7.8*  < > = values in this interval not displayed. CBG:  Recent Labs Lab 02/05/14 1715 02/05/14 2057 02/06/14 0759 02/06/14 1655 02/06/14 2042 02/07/14 0756  GLUCAP 129* 206* 92 142* 217* 78    Studies/Results: Ir Fluoro Guide Cv Line Right  02/06/2014   ADDENDUM REPORT: 02/06/2014 16:18  ADDENDUM: Please note the following correction:  The patient was prepped and draped with Hibiclens. Iodine products were not utilized.   Electronically Signed   By: Maryclare Bean M.D.   On: 02/06/2014 16:18   02/06/2014   CLINICAL DATA:  Dialysis catheter not functioning  EXAM: IR RIGHT FLOURO GUIDE CV LINE  FLUOROSCOPY TIME:  12 seconds.  MEDICATIONS AND MEDICAL HISTORY: None  ANESTHESIA/SEDATION:  None  CONTRAST:  None  PROCEDURE: The procedure, risks, benefits, and alternatives were explained to the patient. Questions regarding the procedure were encouraged and answered. The patient understands and consents to the procedure.  The right chest was prepped with Betadine in a sterile fashion, and a sterile drape was applied covering the operative field. A sterile gown and sterile gloves were used for the procedure.  The existing catheter was localized. 1% lidocaine was utilized to infiltrate the entry site. Utilizing blunt dissection, the cuff was freed from the subcutaneous tissue. It was exchanged over a stiff glidewire for a new 23 cm tip to cuff tunneled dialysis catheter. It was flushed and sewn in place within 0 Prolene pursestring knot.  FINDINGS: The tip of the dialysis catheter is in the right atrium.  COMPLICATIONS: None  IMPRESSION: Successful tunneled dialysis catheter exchange.  Electronically Signed: By: Maryclare Bean M.D. On: 02/06/2014 16:14   Medications: I have reviewed the patient's current medications. Scheduled Meds: . cinacalcet  30 mg Oral Q breakfast  . darbepoetin (ARANESP) injection - DIALYSIS  60 mcg Intravenous Q Thu-HD  . doxercalciferol  1 mcg Intravenous Q T,Th,Sa-HD  .  feeding supplement (RESOURCE BREEZE)  1 Container Oral BID BM  . ferric gluconate (FERRLECIT/NULECIT) IV  125 mg Intravenous Q Thu-HD  . insulin aspart  0-9 Units Subcutaneous TID WC  . insulin detemir  9 Units Subcutaneous QHS  . levothyroxine  25 mcg Oral QAC breakfast  . metoCLOPramide  5 mg Oral TID AC  . multivitamin  1 tablet Oral QHS  . pantoprazole  40 mg Oral BID  . senna-docusate  1 tablet Oral BID  . sevelamer carbonate  1,600 mg Oral TID WC  . sodium chloride  3 mL Intravenous Q12H   Continuous Infusions:  PRN Meds:.hydrALAZINE, oxyCODONE, promethazine, promethazine Assessment/Plan: Principal Problem:   Ischemic ulcer of right foot, limited to breakdown of skin Active Problems:   End  stage renal disease   Essential hypertension   DM type 2, uncontrolled, with renal complications   Hypothyroidism   Hyperparathyroidism, secondary renal   Hyperphosphatemia   Anemia in chronic kidney disease  Ms. 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   Improved nausea likely 2/2 diabetic gastroparesis: -Continue Reglan 5mg  TID (Day 5/14)  -Defer gastric emptying study until outpatient when she can tolerate being off Reglan  Ischemic ulcer of R foot with cellulitis and osteomyelitis 2/2 possible Buerger's disease s/p R BKA POD 4:  -Encourage her to work with PT/OT  -Continue pain control: OxyIR 5mg  q6hr PRN -Per ortho, she will need to follow-up in 7 days to change wound dressing  Hypochloremic AG metabolic acidosis: AG 19, bicarb 22. Stable otherwise.  ESRD on HD: Received HD today. -Continue Sevelamer 1600mg  TID -Renal following, appreciate recs  Anemia of chronic disease: Hb 7.1, down from 7.9 yesterday. Hb 10 on admission.   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  CAD: Restart ASA per Ortho recs.  Diastolic CHF: Stable for now.  HTN: BP trending 110s/60s off meds with some high values. Consider rechecking BP if she is having persistently elevated readings.   DM2: CBGs well controlled -Continue Levermir 9u QHS & SSI-S.  Hypothyroidism: Continue home meds.  FEN:  -Diet: Renal/Carb Modified  DVT prophylaxis: SCDs  CODE STATUS: FULL CODE  Dispo: Disposition is deferred at this time, awaiting improvement of current medical problems.  Need improvement of nausea and eval by PT and OT.  The patient does not  have a current PCP (No primary provider on file.) and does not need an [facility] hospital follow-up appointment after discharge.  The patient does have transportation limitations that hinder transportation to clinic appointments.  .Services Needed at time of discharge: Y = Yes, Blank = No PT: CIR  OT: CIR  RN:   Equipment: 3-in-1 bedside commode, wheelchair  Other:     LOS: 11 days   Charlott Rakes, MD 02/07/2014, 3:58 PM

## 2014-02-08 DIAGNOSIS — I731 Thromboangiitis obliterans [Buerger's disease]: Secondary | ICD-10-CM

## 2014-02-08 DIAGNOSIS — Z89611 Acquired absence of right leg above knee: Secondary | ICD-10-CM

## 2014-02-08 HISTORY — DX: Acquired absence of right leg above knee: Z89.611

## 2014-02-08 HISTORY — DX: Thromboangiitis obliterans (Buerger's disease): I73.1

## 2014-02-08 LAB — GLUCOSE, CAPILLARY
GLUCOSE-CAPILLARY: 72 mg/dL (ref 70–99)
GLUCOSE-CAPILLARY: 89 mg/dL (ref 70–99)
Glucose-Capillary: 152 mg/dL — ABNORMAL HIGH (ref 70–99)

## 2014-02-08 MED ORDER — OXYCODONE HCL 5 MG PO TABS: 5.0000 mg | ORAL_TABLET | Freq: Four times a day (QID) | ORAL | Status: DC | PRN

## 2014-02-08 MED ORDER — CINACALCET HCL 30 MG PO TABS: 30.0000 mg | ORAL_TABLET | Freq: Every day | ORAL | Status: DC

## 2014-02-08 MED ORDER — INSULIN DETEMIR 100 UNIT/ML ~~LOC~~ SOLN: 9.0000 [IU] | Freq: Every day | SUBCUTANEOUS | Status: DC

## 2014-02-08 MED ORDER — BOOST / RESOURCE BREEZE PO LIQD: 1.0000 | Freq: Two times a day (BID) | ORAL | Status: DC

## 2014-02-08 MED ORDER — SEVELAMER CARBONATE 800 MG PO TABS: 1600.0000 mg | ORAL_TABLET | Freq: Three times a day (TID) | ORAL | Status: DC

## 2014-02-08 MED ORDER — METOCLOPRAMIDE HCL 5 MG PO TABS: 5.0000 mg | ORAL_TABLET | Freq: Three times a day (TID) | ORAL | Status: DC

## 2014-02-08 MED ORDER — RENA-VITE PO TABS: 1.0000 | ORAL_TABLET | Freq: Every day | ORAL | Status: DC

## 2014-02-08 MED ORDER — PANTOPRAZOLE SODIUM 40 MG PO TBEC: 40.0000 mg | DELAYED_RELEASE_TABLET | Freq: Every day | ORAL | Status: DC

## 2014-02-08 NOTE — Progress Notes (Signed)
  Date: 02/08/2014  Patient name: Brandi Arellano  Medical record number: [id number]  Date of birth: 11-27-1955   This patient has been seen and the plan of care was discussed with the house staff. Please see their note for complete details. I concur with their findings with the following additions/corrections:  Update from resident note; patient will go to short term rehab on Monday likely.   Sid Falcon, MD 02/08/2014, 8:21 PM

## 2014-02-08 NOTE — Clinical Social Work Note (Addendum)
CSW visited with patient to give bed offers - [place]. Patient also informed that she would have to change dialysis centers while at rehab, and patient refused. CSW and patient talked about support at home and patient explained that she and 2 of her brothers live together and another brother lives very nearby. Brandi Arellano also reported that her granddaughter recently moved here and will be able to help out. Patient spoke very confidently about the help she will have at home. Medical team came to room while CSW and patient were talking and they were updated about discharge plan.  CSW talked with nurse case manager and patient will have [place] services and needed medical equipment. CSW signing off as patient will discharge home with family support and [facility] services.  CSW talked with patient's brother-Brandi Arellano-[phone] (with her permission), and he is in favor of patient going to a facility for short-term rehab. He talked with his sister and patient became agreeable to [place] re[facility]. CSW talked with facility regarding patient agreeable to coming and informed admissions staff person that HD will be changed to [place]. CSW talked with Bethena Roys in dialysis and informed her of facility selection of patient at [facility]. She will begin process of getting patient set-up at the dialysis center in [place]. MD contacted and updated and advised that patient not yet medically stable and will discharge on Monday.  Brandi Arellano, MSW, LCSW [phone]

## 2014-02-08 NOTE — Progress Notes (Signed)
Occupational Therapy Treatment Patient Details Name: Brandi Arellano MRN: [id number] DOB: 1955/08/02 Today's Date: 02/08/2014    History of present illness S/P R BKA, PMH:  DM2, ESRD, CAD, CHF, HTN, amputation of L forefoot.   OT comments  Pt very willing to work with OT on bathing and dressing at sink.  Requiring repetition of sequence for sit to stand.  Fatigues easily in standing.  Willing to remain up in chair at end of session.  Pt is unsafe for home with her brother, she needs constant close supervision to avoid falls.  Follow Up Recommendations  SNF;Supervision/Assistance - 24 hour    Equipment Recommendations  Wheelchair (measurements OT);Wheelchair cushion (measurements OT);3 in 1 bedside comode    Recommendations for Other Services      Precautions / Restrictions Precautions Precautions: Fall       Mobility Bed Mobility Overal bed mobility: Needs Assistance Bed Mobility: Supine to Sit     Supine to sit: Supervision        Transfers Overall transfer level: Needs assistance Equipment used: Rolling walker (2 wheeled) Transfers: Sit to/from Stand Sit to Stand: Min assist         General transfer comment: verbal cues for hand placement and assist to gain balance    Balance                                   ADL Overall ADL's : Needs assistance/impaired     Grooming: Wash/dry face;Supervision/safety;Sitting   Upper Body Bathing: Supervision/ safety;Sitting   Lower Body Bathing: Moderate assistance;Sit to/from stand   Upper Body Dressing : Supervision/safety;Sitting   Lower Body Dressing: Set up;Bed level (to donn slipper)   Toilet Transfer: Minimal assistance;Ambulation;BSC   Toileting- Clothing Manipulation and Hygiene: Moderate assistance;Sit to/from stand       Functional mobility during ADLs: Minimal assistance;Rolling walker General ADL Comments: sat at sink on 3 in 1, pt without awareness that she had BM in the bed       Vision                     Perception     Praxis      Cognition   Behavior During Therapy: [facility] for tasks assessed/performed Overall Cognitive Status: History of cognitive impairments - at baseline       Memory: Decreased recall of precautions;Decreased short-term memory               Extremity/Trunk Assessment               Exercises     Shoulder Instructions       General Comments      Pertinent Vitals/ Pain       Pain Assessment: No/denies pain  Home Living                                          Prior Functioning/Environment              Frequency Min 2X/week     Progress Toward Goals  OT Goals(current goals can now be found in the care plan section)  Progress towards OT goals: Progressing toward goals  Acute Rehab OT Goals Patient Stated Goal: Regain independence.  Plan Discharge plan needs to be updated    Co-evaluation  End of Session     Activity Tolerance Patient tolerated treatment well   Patient Left in chair;with call bell/phone within reach   Nurse Communication Other (comment) (aware pt is in chair, needs bed changed)        Time: 5396-7289 OT Time Calculation (min): 38 min  Charges: OT Treatments $Self Care/Home Management : 38-52 mins  Malka So 02/08/2014, 11:11 AM [phone]

## 2014-02-08 NOTE — Clinical Social Work Placement (Addendum)
Clinical Social Work Department CLINICAL SOCIAL WORK PLACEMENT NOTE 02/08/2014  Patient:  Brandi Arellano, Brandi Arellano  Account Number:  [id number] Admit date:  01/27/2014  Clinical Social Worker:  Daylen Lipsky Givens, LCSW  Date/time:  02/08/2014 07:51 AM  Clinical Social Work is seeking post-discharge placement for this patient at the following level of care:   [place]   (*CSW will update this form in Epic as items are completed)   02/05/2014  Patient/family provided with [place] Department of Clinical Social Work's list of facilities offering this level of care within the geographic area requested by the patient (or if unable, by the patient's family).  02/05/2014  Patient/family informed of their freedom to choose among providers that offer the needed level of care, that participate in Medicare, Medicaid or managed care program needed by the patient, have an available bed and are willing to accept the patient.    Patient/family informed of [facility]' ownership interest in [facility], as well as of the fact that they are under no obligation to receive care at this facility.  PASARR submitted to EDS on 02/08/2014 PASARR number received on 02/08/14  FL2 transmitted to all facilities in geographic area requested by pt/family on  02/05/2014 FL2 transmitted to all facilities within larger geographic area on 02/06/2014  Patient informed that his/her managed care company has contracts with or will negotiate with  certain facilities, including the following:     Patient/family informed of bed offers received:  02/08/2014 Patient chooses bed at [place] [facility] Physician recommends and patient chooses bed at    Patient to be transferred to [place] on  02/11/2014 Patient to be transferred to facility by  Patient and family notified of transfer on  Name of family member notified:    The following physician request were entered in Epic:   Additional  Comments:

## 2014-02-08 NOTE — Care Management Note (Signed)
CARE MANAGEMENT NOTE 02/08/2014  Patient:  Brandi Arellano, Brandi Arellano   Account Number:  [id number]  Date Initiated:  01/29/2014  Documentation initiated by:  Lizabeth Leyden  Subjective/Objective Assessment:   admitted with right foot infection,  medical hx: DM, ESRD/HD, CHF     Action/Plan:   progression of care and discharge planning  02/08/2014 Plan now to d/c to home, DME and [facility] arranged  02/08/2014 Pt and family now plan on SNF in [place]. MD notified.   Anticipated DC Date:  02/11/2014   Anticipated DC Plan:  SKILLED NURSING FACILITY  In-house referral  Clinical Social Worker         Choice offered to / List presented to:             Status of service:  Completed, signed off Medicare Important Message given?   (If response is "NO", the following Medicare IM given date fields will be blank) Date Medicare IM given:   Medicare IM given by:   Date Additional Medicare IM given:   Additional Medicare IM given by:    Discharge Disposition:  [place]  Per UR Regulation:    If discussed at Long Length of Stay Meetings, dates discussed:    Comments:  02/08/2014 After much discussion with pt and family the family has convinced the pt that she would benefit from SNF short term. This CM cancelled DME and HH. CSW, Crawford Givens managing this with pt and family and notiifed the pt MD.  Demetrios Isaacs RN MPH, case manager, [phone]   02/08/2014 Met with pt who has walker at home and order placed for 3:1, MD has ordered wheelchair, [facility] notified of plan to d/c today. HH arranged for [facility] and HHPT with [facility]. CRoyal RN MPH, case manager, [phone]  02/05/2014  [place], [place] [phone] Right BKA on 02/01/2014 plan for discharge to SNF when medically stable

## 2014-02-08 NOTE — Care Management Note (Signed)
Pt MD paged and notified that pt and family have decided that pt would benefit for short term rehab at [facility] . As her only offer is in [place] her HD center will need to be changed and CSW Crawford Givens has notified Bryson Dames , of the HD dept who will arrange a change in HD centers for this pt .  Jasmine Pang RN MPH, case manager, [phone]

## 2014-02-08 NOTE — Progress Notes (Signed)
[facility] Progress Note  Assessment/Plan: 1. R foot osteomyelitis/cellulitis, s/p R BKA 10/23 - s/p Vanc and Zosyn -  2. ESRD - TTS - Has been seen in the past for permanent vascular access, but no plan yet - poor options; K 4.1- reassess edw post surgery-close to 52kgs now.- catheter was exchanged 10/28 due to flow issues; plan HD tomorrow.   3. Hypertension/volume - 105s - 120s - volume ok.  EDW at 52kg 4. Anemia -  Hb further decreased to 7.1 this AM.  Prev Fe studies with relative Fe def.  Repeat CBC in AM and likely either Fe load or transfuse. ASx 5. Metabolic bone disease - Continue Hectorol -refusing binders in pill form - change to powdered renvela while here - she can continue prior renvela after d/c- repeat P today 6. Nutrition - Renal/carb motified diet. multivit Alb 2.6 - ad resource bid - need to recheck P now that on renvela powder 7. Hypothyroidism - on synthroid  8. DM - per primary. on SSI at home - need to update outpt med list at d/c 9. Pain/ nausea - per primary, concern for gastroparesis. Starting reglan - improved 10. Dispo- PT/OT- declined PT . will likely need SNF 11. Reflux sx - PPI 12. Hypothyroidism  Pearson Grippe, MD  02/08/2014,4:12 PM  LOS: 12 days   Subjective:   Has decided to go to a skilled nursing facility upon discharge. She will only be a candidate and [facility] and will switch to [employer]. She continues to have improvement in her nausea and vomiting.  Objective Filed Vitals:   02/07/14 1712 02/07/14 2052 02/08/14 0517 02/08/14 0921  BP: 118/49 94/54 122/65 117/56  Pulse: 93 92 82 92  Temp: 98.3 F (36.8 C) 99.1 F (37.3 C) 98.4 F (36.9 C) 98.4 F (36.9 C)  TempSrc: Oral   Oral  Resp: 18 17 18 18   Height:      Weight:  52 kg (114 lb 10.2 oz)    SpO2: 98% 95% 96% 98%   Physical Exam General: looks good Heart: RRR 2/6 murmur Lungs: no rales Abdomen: soft Extremities: no sig edema right bka  wrapped Dialysis Access: new right IJ 10/28 - echanged  Dialysis Orders:[facility] TTS 4 hr 160 Optiflux 400/600 EDW 55 2K 2.25 Ca right I-J profile 4  Aranesp 40 last given 10/15 Hectorol 1    Additional Objective Labs: Basic Metabolic Panel:  Recent Labs Lab 02/04/14 0550 02/05/14 1343 02/06/14 1015 02/07/14 0547 02/07/14 1100  NA 138 133* 135* 133* 134*  K 4.1 4.0 3.9 3.9 3.8  CL 95* 91* 94* 92* 93*  CO2 16* 19 21 19 22   GLUCOSE 116* 176* 127* 91 106*  BUN 59* 72* 45* 35* 37*  CREATININE 8.37* 11.04* 8.26* 7.22* 7.57*  CALCIUM 8.5 7.7* 8.4 8.1* 7.9*  PHOS 10.4* 10.9*  --   --  7.8*   Liver Function Tests:  Recent Labs Lab 02/04/14 0550 02/05/14 1343 02/07/14 1100  ALBUMIN 2.6* 2.6* 2.6*   CBC:  Recent Labs Lab 02/02/14 0722 02/04/14 0550 02/05/14 1343 02/06/14 1015 02/07/14 1100  WBC 11.7* 11.6* 11.3* 9.4 9.6  HGB 10.1* 8.9* 8.0* 7.9* 7.1*  HCT 29.9* 27.4* 24.1* 24.0* 21.9*  MCV 91.2 95.8 95.3 96.0 93.6  PLT 244 251 222 208 193  CBG:  Recent Labs Lab 02/07/14 0756 02/07/14 1707 02/07/14 2049 02/08/14 0724 02/08/14 1107  GLUCAP 78 155* 206* 72 89   IMedications:   . cinacalcet  30 mg Oral Q breakfast  . darbepoetin (ARANESP) injection - DIALYSIS  60 mcg Intravenous Q Thu-HD  . doxercalciferol  1 mcg Intravenous Q T,Th,Sa-HD  . feeding supplement (RESOURCE BREEZE)  1 Container Oral BID BM  . ferric gluconate (FERRLECIT/NULECIT) IV  125 mg Intravenous Q Thu-HD  . insulin aspart  0-9 Units Subcutaneous TID WC  . insulin detemir  9 Units Subcutaneous QHS  . levothyroxine  25 mcg Oral QAC breakfast  . metoCLOPramide  5 mg Oral TID AC  . multivitamin  1 tablet Oral QHS  . pantoprazole  40 mg Oral BID  . senna-docusate  1 tablet Oral BID  . sevelamer carbonate  1,600 mg Oral TID WC  . sodium chloride  3 mL Intravenous Q12H

## 2014-02-08 NOTE — Progress Notes (Signed)
Subjective: No complaints this AM. She is looking forward to going home and reports that she has adequate support. As such, she will no longer need SNF placement and is stable.  Objective: Vital signs in last 24 hours: Filed Vitals:   02/07/14 1712 02/07/14 2052 02/08/14 0517 02/08/14 0921  BP: 118/49 94/54 122/65 117/56  Pulse: 93 92 82 92  Temp: 98.3 F (36.8 C) 99.1 F (37.3 C) 98.4 F (36.9 C) 98.4 F (36.9 C)  TempSrc: Oral   Oral  Resp: 18 17 18 18   Height:      Weight:  114 lb 10.2 oz (52 kg)    SpO2: 98% 95% 96% 98%   Weight change: -7.1 oz (-0.2 kg)  Intake/Output Summary (Last 24 hours) at 02/08/14 1158 Last data filed at 02/08/14 0921  Gross per 24 hour  Intake    320 ml  Output   1280 ml  Net   -960 ml   General: sitting up in bed, smiling HEENT: EOMI, no scleral icterus Cardiac: RRR, no rubs, murmurs or gallops Pulm: clear to auscultation bilaterally Abd: soft, nontender, nondistended, BS present Ext: right BKA, bandage covered/dry/intact  Lab Results: Basic Metabolic Panel:  Recent Labs Lab 02/05/14 1343  02/07/14 0547 02/07/14 1100  NA 133*  < > 133* 134*  K 4.0  < > 3.9 3.8  CL 91*  < > 92* 93*  CO2 19  < > 19 22  GLUCOSE 176*  < > 91 106*  BUN 72*  < > 35* 37*  CREATININE 11.04*  < > 7.22* 7.57*  CALCIUM 7.7*  < > 8.1* 7.9*  PHOS 10.9*  --   --  7.8*  < > = values in this interval not displayed. CBG:  Recent Labs Lab 02/06/14 2042 02/07/14 0756 02/07/14 1707 02/07/14 2049 02/08/14 0724 02/08/14 1107  GLUCAP 217* 78 155* 206* 72 89    Studies/Results: Ir Fluoro Guide Cv Line Right  02/06/2014   ADDENDUM REPORT: 02/06/2014 16:18  ADDENDUM: Please note the following correction:  The patient was prepped and draped with Hibiclens. Iodine products were not utilized.   Electronically Signed   By: Maryclare Bean M.D.   On: 02/06/2014 16:18   02/06/2014   CLINICAL DATA:  Dialysis catheter not functioning  EXAM: IR RIGHT FLOURO GUIDE CV  LINE  FLUOROSCOPY TIME:  12 seconds.  MEDICATIONS AND MEDICAL HISTORY: None  ANESTHESIA/SEDATION: None  CONTRAST:  None  PROCEDURE: The procedure, risks, benefits, and alternatives were explained to the patient. Questions regarding the procedure were encouraged and answered. The patient understands and consents to the procedure.  The right chest was prepped with Betadine in a sterile fashion, and a sterile drape was applied covering the operative field. A sterile gown and sterile gloves were used for the procedure.  The existing catheter was localized. 1% lidocaine was utilized to infiltrate the entry site. Utilizing blunt dissection, the cuff was freed from the subcutaneous tissue. It was exchanged over a stiff glidewire for a new 23 cm tip to cuff tunneled dialysis catheter. It was flushed and sewn in place within 0 Prolene pursestring knot.  FINDINGS: The tip of the dialysis catheter is in the right atrium.  COMPLICATIONS: None  IMPRESSION: Successful tunneled dialysis catheter exchange.  Electronically Signed: By: Maryclare Bean M.D. On: 02/06/2014 16:14   Medications: I have reviewed the patient's current medications. Scheduled Meds: . cinacalcet  30 mg Oral Q breakfast  . darbepoetin (ARANESP) injection - DIALYSIS  60 mcg Intravenous Q Thu-HD  . doxercalciferol  1 mcg Intravenous Q T,Th,Sa-HD  . feeding supplement (RESOURCE BREEZE)  1 Container Oral BID BM  . ferric gluconate (FERRLECIT/NULECIT) IV  125 mg Intravenous Q Thu-HD  . insulin aspart  0-9 Units Subcutaneous TID WC  . insulin detemir  9 Units Subcutaneous QHS  . levothyroxine  25 mcg Oral QAC breakfast  . metoCLOPramide  5 mg Oral TID AC  . multivitamin  1 tablet Oral QHS  . pantoprazole  40 mg Oral BID  . senna-docusate  1 tablet Oral BID  . sevelamer carbonate  1,600 mg Oral TID WC  . 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 Active Problems:   End stage renal disease   Essential hypertension   DM type 2, uncontrolled, with renal complications   Hypothyroidism   Hyperparathyroidism, secondary renal   Hyperphosphatemia   Anemia in chronic kidney disease   S/P BKA (below knee amputation) unilateral   Buerger's disease  Brandi Arellano is a 58 year old female with DM2, ESRD, HTN, hypothyroidism who was hospitalized for ischemic ulcer of R foot with cellulitis and osteomyelitis likely 2/2 possible Buerger's disease s/p R BKA POD 4 found to have stenosing flexor synovitis of L 3rd digit now with improved nausea likely 2/2 diabetic gastroparesis and hypochloremic anion gap metabolic acidosis.   Improved nausea likely 2/2 diabetic gastroparesis: -Continue Reglan 5mg  TID (Day 5/14)  -Defer gastric emptying study until outpatient when she can tolerate being off Reglan  Ischemic ulcer of R foot with cellulitis and osteomyelitis 2/2 possible Buerger's disease s/p R BKA POD 4:  -Continue pain control: OxyIR 5mg  q6hr PRN -Per ortho, she will need to follow-up in 7 days to change wound dressing  Hypochloremic AG metabolic acidosis: Presumably stable since labs were not drawn today.  ESRD on HD: Follow-up as outpatient.  Anemia of chronic disease: Presumably stable since labs were not drawn today.  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  CAD: Restart ASA per Ortho recs.  Diastolic CHF: Stable for now.  HTN: BP trending 100s/60s off meds with some high values. Consider restarting with PCP follow-up.   DM2: CBGs well controlled. Discharge on Levermir 9u QHS.  Hypothyroidism: Continue home meds.  FEN:  -Diet: Renal/Carb Modified  DVT prophylaxis: SCDs  CODE STATUS: FULL CODE  Dispo: Disposition is deferred at this time, awaiting improvement of current medical problems.   Need improvement of nausea and eval by PT and OT.  The patient does not have a current PCP (No primary provider on file.) and does not need an [facility] hospital follow-up appointment after discharge.  The patient does have transportation limitations that hinder transportation to clinic appointments.  .Services Needed at time of discharge: Y = Yes, Blank = No PT: CIR  OT: CIR  RN:   Equipment: 3-in-1 bedside commode, wheelchair  Other:     LOS: 12 days   Charlott Rakes, MD 02/08/2014, 11:58 AM

## 2014-02-08 NOTE — Care Management Note (Signed)
CARE MANAGEMENT NOTE 02/08/2014  Patient:  KINSHASA, THROCKMORTON   Account Number:  [id number]  Date Initiated:  01/29/2014  Documentation initiated by:  Lizabeth Leyden  Subjective/Objective Assessment:   admitted with right foot infection,  medical hx: DM, ESRD/HD, CHF     Action/Plan:   progression of care and discharge planning  02/08/2014 Plan now to d/c to home, DME and [facility] arranged   Anticipated DC Date:  02/08/2014   Anticipated DC Plan:  [place]  In-house referral  Clinical Social Worker         Choice offered to / List presented to:     DME arranged  [place]  3-N-1      DME agency  [place] arranged  HH-1 RN  [place].   Status of service:  Completed, signed off Medicare Important Message given?   (If response is "NO", the following Medicare IM given date fields will be blank) Date Medicare IM given:   Medicare IM given by:   Date Additional Medicare IM given:   Additional Medicare IM given by:    Discharge Disposition:  [place]  Per UR Regulation:    If discussed at Long Length of Stay Meetings, dates discussed:    Comments:   0/30/2015 Met with pt who has walker at home and order placed for 3:1, MD has ordered wheelchair, [facility] notified of plan to d/c today. HH arranged for [facility] and HHPT with [facility]. CRoyal RN MPH, case manager, [phone]  02/05/2014  [place], [place] [phone] Right BKA on 02/01/2014 plan for discharge to SNF when medically stable

## 2014-02-09 DIAGNOSIS — M65842 Other synovitis and tenosynovitis, left hand: Secondary | ICD-10-CM

## 2014-02-09 DIAGNOSIS — L97514 Non-pressure chronic ulcer of other part of right foot with necrosis of bone: Secondary | ICD-10-CM

## 2014-02-09 LAB — RENAL FUNCTION PANEL
Albumin: 2.5 g/dL — ABNORMAL LOW (ref 3.5–5.2)
Anion gap: 16 — ABNORMAL HIGH (ref 5–15)
BUN: 20 mg/dL (ref 6–23)
CALCIUM: 8.1 mg/dL — AB (ref 8.4–10.5)
CO2: 24 mEq/L (ref 19–32)
CREATININE: 6.51 mg/dL — AB (ref 0.50–1.10)
Chloride: 95 mEq/L — ABNORMAL LOW (ref 96–112)
GFR calc Af Amer: 7 mL/min — ABNORMAL LOW (ref >90)
GFR, EST NON AFRICAN AMERICAN: 6 mL/min — AB (ref >90)
Glucose, Bld: 59 mg/dL — ABNORMAL LOW (ref 70–99)
PHOSPHORUS: 5.1 mg/dL — AB (ref 2.3–4.6)
Potassium: 4 mEq/L (ref 3.7–5.3)
SODIUM: 135 meq/L — AB (ref 137–147)

## 2014-02-09 LAB — GLUCOSE, CAPILLARY
Glucose-Capillary: 208 mg/dL — ABNORMAL HIGH (ref 70–99)
Glucose-Capillary: 73 mg/dL (ref 70–99)
Glucose-Capillary: 222 mg/dL — ABNORMAL HIGH (ref 70–99)
Glucose-Capillary: 119 mg/dL — ABNORMAL HIGH (ref 70–99)

## 2014-02-09 LAB — CBC
HCT: 22 % — ABNORMAL LOW (ref 36.0–46.0)
Hemoglobin: 7.1 g/dL — ABNORMAL LOW (ref 12.0–15.0)
MCH: 30.7 pg (ref 26.0–34.0)
MCHC: 32.3 g/dL (ref 30.0–36.0)
MCV: 95.2 fL (ref 78.0–100.0)
PLATELETS: 217 10*3/uL (ref 150–400)
RBC: 2.31 MIL/uL — ABNORMAL LOW (ref 3.87–5.11)
RDW: 14.9 % (ref 11.5–15.5)
WBC: 8.7 10*3/uL (ref 4.0–10.5)

## 2014-02-09 LAB — ABO/RH: ABO/RH(D): O POS

## 2014-02-09 LAB — PREPARE RBC (CROSSMATCH)

## 2014-02-09 LAB — FERRITIN: Ferritin: 726 ng/mL — ABNORMAL HIGH (ref 10–291)

## 2014-02-09 MED ORDER — SODIUM CHLORIDE 0.9 % IV SOLN: Freq: Once | INTRAVENOUS | Status: DC

## 2014-02-09 MED ORDER — DOXERCALCIFEROL 4 MCG/2ML IV SOLN
INTRAVENOUS | Status: AC
  Filled 2014-02-09: qty 2

## 2014-02-09 NOTE — Procedures (Signed)
I was present at this dialysis session. I have reviewed the session itself and made appropriate changes.   Pearson Grippe  MD 02/09/2014, 7:31 AM

## 2014-02-09 NOTE — Progress Notes (Signed)
Subjective: Yesterday, she was amenable to going home, but her brother did not feel comfortable with providing her 24-hour care. As such, she elected for SNF placement.  This AM, she was in HD but has no complaints at bedside.  Objective: Vital signs in last 24 hours: Filed Vitals:   02/09/14 0711 02/09/14 0800 02/09/14 0830 02/09/14 0900  BP: 111/66 142/77 133/77 157/84  Pulse: 81 83 91 86  Temp:      TempSrc:      Resp:  12 12 14   Height:      Weight:      SpO2:       Weight change:  No intake or output data in the 24 hours ending 02/09/14 0927  General: sitting up in bed, smiling HEENT: EOMI, no scleral icterus Cardiac: RRR, systolic murmur best auscultated at aortic site Pulm: clear to auscultation bilaterally Abd: soft, nontender, nondistended, BS present Ext: right BKA, bandage covered/dry/intact  Lab Results: Basic Metabolic Panel:  Recent Labs Lab 02/07/14 1100 02/09/14 0719  NA 134* 135*  K 3.8 4.0  CL 93* 95*  CO2 22 24  GLUCOSE 106* 59*  BUN 37* 20  CREATININE 7.57* 6.51*  CALCIUM 7.9* 8.1*  PHOS 7.8* 5.1*   CBG:  Recent Labs Lab 02/07/14 1707 02/07/14 2049 02/08/14 0724 02/08/14 1107 02/08/14 1658 02/08/14 2126  GLUCAP 155* 206* 72 89 152* 208*    Studies/Results: No results found.  Medications: I have reviewed the patient's current medications. Scheduled Meds: . sodium chloride   Intravenous Once  . cinacalcet  30 mg Oral Q breakfast  . darbepoetin (ARANESP) injection - DIALYSIS  60 mcg Intravenous Q Thu-HD  . doxercalciferol      . doxercalciferol  1 mcg Intravenous Q T,Th,Sa-HD  . feeding supplement (RESOURCE BREEZE)  1 Container Oral BID BM  . ferric gluconate (FERRLECIT/NULECIT) IV  125 mg Intravenous Q Thu-HD  . insulin aspart  0-9 Units Subcutaneous TID WC  . insulin detemir  9 Units Subcutaneous QHS  . levothyroxine  25 mcg Oral QAC breakfast  . metoCLOPramide  5 mg Oral TID AC  . multivitamin  1 tablet Oral QHS  .  pantoprazole  40 mg Oral BID  . senna-docusate  1 tablet Oral BID  . sevelamer carbonate  1,600 mg Oral TID WC  . 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 Rappleye is a 58 year old female with DM2, ESRD, HTN, hypothyroidism who was hospitalized for ischemic ulcer of R foot with cellulitis and osteomyelitis likely 2/2 possible Buerger's disease s/p R BKA POD 4 found to have stenosing flexor synovitis of L 3rd digit now with improved nausea likely 2/2 diabetic gastroparesis and hypochloremic anion gap metabolic acidosis.   Improved nausea likely 2/2 diabetic gastroparesis: -Continue Reglan 5mg  TID (Day 6/14)  -Defer gastric emptying study until outpatient when she can tolerate being off Reglan  Ischemic ulcer of R foot with cellulitis and osteomyelitis 2/2 possible Buerger's disease s/p R BKA POD 8:  -Continue pain control: OxyIR 5mg  q6hr PRN -Scheduled for  [place] follow-up on 11/6  Hypochloremic AG metabolic acidosis: Presumably stable since labs were not drawn today.  ESRD on HD: Pending follow-up with a new dialysis center closer to new SNF.  Anemia of chronic disease: Presumably stable since labs were not drawn today.  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  CAD: Restart ASA per Ortho recs.  Diastolic CHF: Stable for now.  HTN: Normotensive save several elevated readings off meds with some high values. Consider restarting with PCP follow-up.   DM2: CBGs well  controlled. Discharge on Levermir 9u QHS.  Hypothyroidism: Continue home meds.  FEN:  -Diet: Renal/Carb Modified  DVT prophylaxis: SCDs  CODE STATUS: FULL CODE  Dispo: Disposition is deferred at this time, awaiting improvement of current medical problems.  Need improvement of nausea and eval by PT and OT.  The patient does not have a current PCP (No primary provider on file.) and does not need an [facility] hospital follow-up appointment after discharge.  The patient does have transportation limitations that hinder transportation to clinic appointments.  .Services Needed at time of discharge: Y = Yes, Blank = No PT: CIR  OT: CIR  RN:   Equipment: 3-in-1 bedside commode, wheelchair  Other:     LOS: 13 days   Charlott Rakes, MD 02/09/2014, 9:27 AM

## 2014-02-09 NOTE — Progress Notes (Signed)
Case d/w Dr. Posey Pronto in detail and I agree with documentation as outlined in his note

## 2014-02-10 ENCOUNTER — Encounter (HOSPITAL_COMMUNITY): Payer: Medicaid Other | Admitting: *Deleted

## 2014-02-10 LAB — TYPE AND SCREEN
ABO/RH(D): O POS
Antibody Screen: NEGATIVE
UNIT DIVISION: 0
Unit division: 0

## 2014-02-10 LAB — GLUCOSE, CAPILLARY
GLUCOSE-CAPILLARY: 73 mg/dL (ref 70–99)
GLUCOSE-CAPILLARY: 220 mg/dL — AB (ref 70–99)
GLUCOSE-CAPILLARY: 180 mg/dL — AB (ref 70–99)
Glucose-Capillary: 52 mg/dL — ABNORMAL LOW (ref 70–99)
Glucose-Capillary: 146 mg/dL — ABNORMAL HIGH (ref 70–99)

## 2014-02-10 MED ORDER — SODIUM CHLORIDE 0.9 % IV SOLN
125.0000 mg | INTRAVENOUS | Status: DC
  Administered 2014-02-12: 125 mg via INTRAVENOUS
  Filled 2014-02-10 (×2): qty 10

## 2014-02-10 MED ORDER — DARBEPOETIN ALFA 60 MCG/0.3ML IJ SOSY
60.0000 ug | PREFILLED_SYRINGE | INTRAMUSCULAR | Status: DC
  Filled 2014-02-10: qty 0.3

## 2014-02-10 NOTE — Progress Notes (Signed)
[facility] Progress Note  Assessment/Plan: 1. R foot osteomyelitis/cellulitis, s/p R BKA 10/23 - s/p Vanc and Zosyn -  2. ESRD - TTS - Has been seen in the past for permanent vascular access, but no plan yet - poor options; K 4.0-  catheter was exchanged 10/28 due to flow issues- next HD Tuesday 3. Hypertension/volume - variable 124/79 this am- volume ok. new EDW at 52kg (got to 52.5 yesterday) 4. Anemia - Hb 7.1 10/30 and 10/31 - transfused 2 units PRBC on HD 10/31 Aranesp 60 - ferritin 726 10/31- on weekly IV Fe - will increase to 125 x 4 then weekly 5. Metabolic bone disease - Continue Hectorol -refused binders in pill form - change to powdered renvela while here - she can continue prior renvela after d/c- repeat P now down to 5.1 from 10s! 6. Nutrition - Renal/carb motified diet. multivit Alb 2.6 - ad resource bid - need to recheck P now that on renvela powder 7. Hypothyroidism - on synthroid  8. DM - per primary. on SSI at home - need to update outpt med list at d/c 9. Pain/ nausea - per primary, concern for gastroparesis. Starting reglan - improved 10. Dispo- waiting on  SNF placement 11. Reflux sx - PPI  Myriam Jacobson, PA-C [facility] Beeper [phone] 02/10/2014,9:19 AM  LOS: 14 days   Subjective:   Trying to get appetite back  Objective Filed Vitals:   02/09/14 1826 02/09/14 2117 02/09/14 2135 02/10/14 0535  BP: 160/77 92/54 117/63 124/79  Pulse: 89 86 73 88  Temp: 98.7 F (37.1 C) 99.5 F (37.5 C)  98.4 F (36.9 C)  TempSrc: Oral Oral  Oral  Resp: 16 17 16 16   Height:  5\' 3"  (1.6 m)    Weight:  52.9 kg (116 lb 10 oz)    SpO2: 97% 96%  96%   Physical Exam General: NAD reading paper Heart: RRR 2.6 murmur Lungs: no rales Abdomen:  Soft NT + BS Extremities: right BKA wrapped; left LE no edema; healed left transmet - chronically sensitive to tough Dialysis Access: right IJ [facility] TTS 4 hr 160 Optiflux 400/600 EDW 55 2K 2.25 Ca  right I-J profile 4 Aranesp 40 last given 10/15 - first dose after being on hold x 1 month - Hgb 9.2 10/15 ferritin 158 sat 23% 9/26 - not treated with IV Fe because at that time Hgb was 12; iPTH 268 8/8 on Hectorol 1 - last P 5.5  Dialysis Orders:   Additional Objective Labs: Basic Metabolic Panel:  Recent Labs Lab 02/05/14 1343  02/07/14 0547 02/07/14 1100 02/09/14 0719  NA 133*  < > 133* 134* 135*  K 4.0  < > 3.9 3.8 4.0  CL 91*  < > 92* 93* 95*  CO2 19  < > 19 22 24   GLUCOSE 176*  < > 91 106* 59*  BUN 72*  < > 35* 37* 20  CREATININE 11.04*  < > 7.22* 7.57* 6.51*  CALCIUM 7.7*  < > 8.1* 7.9* 8.1*  PHOS 10.9*  --   --  7.8* 5.1*  < > = values in this interval not displayed. Liver Function Tests:  Recent Labs Lab 02/05/14 1343 02/07/14 1100 02/09/14 0719  ALBUMIN 2.6* 2.6* 2.5*   CBC:  Recent Labs Lab 02/04/14 0550 02/05/14 1343 02/06/14 1015 02/07/14 1100 02/09/14 0719  WBC 11.6* 11.3* 9.4 9.6 8.7  HGB 8.9* 8.0* 7.9* 7.1* 7.1*  HCT 27.4* 24.1* 24.0* 21.9* 22.0*  MCV 95.8 95.3 96.0 93.6 95.2  PLT 251 222 208 193 217   Blood Culture    Component Value Date/Time   SDES BLOOD RIGHT HAND 01/27/2014 1722   SPECREQUEST BOTTLES DRAWN AEROBIC AND ANAEROBIC 5CC EA 01/27/2014 1722   CULT  01/27/2014 1722    NO GROWTH 5 DAYS Performed at [place] 02/02/2014 FINAL 01/27/2014 1722   CBG:  Recent Labs Lab 02/09/14 1247 02/09/14 1712 02/09/14 2115 02/10/14 0735 02/10/14 0759  GLUCAP 73 222* 119* 52* 73   Iron Studies:   Recent Labs  02/09/14 0719  FERRITIN 726*  Medications:   . sodium chloride   Intravenous Once  . cinacalcet  30 mg Oral Q breakfast  . darbepoetin (ARANESP) injection - DIALYSIS  60 mcg Intravenous Q Thu-HD  . doxercalciferol  1 mcg Intravenous Q T,Th,Sa-HD  . feeding supplement (RESOURCE BREEZE)  1 Container Oral BID BM  . [START ON 02/12/2014] ferric gluconate (FERRLECIT/NULECIT) IV  125 mg Intravenous Q  T,Th,Sa-HD  . insulin aspart  0-9 Units Subcutaneous TID WC  . insulin detemir  9 Units Subcutaneous QHS  . levothyroxine  25 mcg Oral QAC breakfast  . metoCLOPramide  5 mg Oral TID AC  . multivitamin  1 tablet Oral QHS  . pantoprazole  40 mg Oral BID  . senna-docusate  1 tablet Oral BID  . sevelamer carbonate  1,600 mg Oral TID WC  . sodium chloride  3 mL Intravenous Q12H

## 2014-02-10 NOTE — Plan of Care (Signed)
Problem: Phase II Progression Outcomes Goal: Transfers with moderate assist Outcome: Progressing Pt requires one person minimal assist with transfers.

## 2014-02-10 NOTE — Progress Notes (Signed)
Pt informed that IV is outdated. Pt refused for IV to be restarted. IV flushed, and will continue to be monitored.

## 2014-02-10 NOTE — Clinical Social Work Note (Signed)
CSW continues to follow for d/c planning. CSW spoke with MD regarding confirmation of dialysis set up prior to d/c. CSW contacted [facility] and spoke with Fransisco Beau who reports dialysis will not be set up until Monday or Tuesday. CSW made MD aware. CSW to follow tomorrow.  [place], [place] Weekend Clinical Social Worker [phone]

## 2014-02-10 NOTE — Progress Notes (Signed)
Subjective: Pt feels okay today. Irritated, not sure why. No complaints. No events overnight.   Objective: Vital signs in last 24 hours: Filed Vitals:   02/09/14 2117 02/09/14 2135 02/10/14 0535 02/10/14 0930  BP: 92/54 117/63 124/79 129/67  Pulse: 86 73 88 81  Temp: 99.5 F (37.5 C)  98.4 F (36.9 C) 98.8 F (37.1 C)  TempSrc: Oral  Oral Oral  Resp: 17 16 16 16   Height: 5\' 3"  (1.6 m)     Weight: 116 lb 10 oz (52.9 kg)     SpO2: 96%  96% 96%   Weight change:   Intake/Output Summary (Last 24 hours) at 02/10/14 1059 Last data filed at 02/10/14 0915  Gross per 24 hour  Intake    720 ml  Output   1900 ml  Net  -1180 ml   Physical Exam-  General: sitting up in bed, smiling HEENT: EOMI, moist oral mucosa Cardiac: RRR, 3/6 systolic murmur best auscultated at aortic site Pulm: clear to auscultation bilaterally, no added sounds Abd: soft, nontender, nondistended, BS present Ext: right BKA, clean bandage covered/dry/intact, non tender, Left Ray amputation.  Lab Results: Basic Metabolic Panel:  Recent Labs Lab 02/07/14 1100 02/09/14 0719  NA 134* 135*  K 3.8 4.0  CL 93* 95*  CO2 22 24  GLUCOSE 106* 59*  BUN 37* 20  CREATININE 7.57* 6.51*  CALCIUM 7.9* 8.1*  PHOS 7.8* 5.1*   CBG:  Recent Labs Lab 02/08/14 2126 02/09/14 1247 02/09/14 1712 02/09/14 2115 02/10/14 0735 02/10/14 0759  GLUCAP 208* 73 222* 119* 52* 73    Medications: I have reviewed the patient's current medications. Scheduled Meds: . sodium chloride   Intravenous Once  . cinacalcet  30 mg Oral Q breakfast  . darbepoetin (ARANESP) injection - DIALYSIS  60 mcg Intravenous Q Thu-HD  . doxercalciferol  1 mcg Intravenous Q T,Th,Sa-HD  . feeding supplement (RESOURCE BREEZE)  1 Container Oral BID BM  . [START ON 02/12/2014] ferric gluconate (FERRLECIT/NULECIT) IV  125 mg Intravenous Q T,Th,Sa-HD  . insulin aspart  0-9 Units Subcutaneous TID WC  . insulin detemir  9 Units Subcutaneous QHS  .  levothyroxine  25 mcg Oral QAC breakfast  . metoCLOPramide  5 mg Oral TID AC  . multivitamin  1 tablet Oral QHS  . pantoprazole  40 mg Oral BID  . senna-docusate  1 tablet Oral BID  . sevelamer carbonate  1,600 mg Oral TID WC  . 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 Active Problems:   End stage renal disease   Essential hypertension   DM type 2, uncontrolled, with renal complications   Hypothyroidism   Hyperparathyroidism, secondary renal   Hyperphosphatemia   Anemia in chronic kidney disease   S/P BKA (below knee amputation) unilateral   Buerger's disease  Brandi Arellano is a 58 year old female with DM2, ESRD, HTN, hypothyroidism who was hospitalized for ischemic ulcer of R foot with cellulitis and osteomyelitis likely 2/2 possible Buerger's disease s/p R BKA POD 4 found to have stenosing flexor synovitis of L 3rd digit now with improved nausea likely 2/2 diabetic gastroparesis and hypochloremic anion gap metabolic acidosis.   Ischemic ulcer of R foot with cellulitis and osteomyelitis 2/2 possible Buerger's disease s/p R BKA POD 8:  -Continue pain control: OxyIR 5mg  q6hr PRN -Scheduled for otho follow-up on  11/6 - Discharge once SNF arranged with dialysis, called Social work today, to get back to me with update. - Schedule follow up appointment once discharge date ascertained.  Improved nausea likely 2/2 diabetic gastroparesis: -Continue Reglan 5mg  TID (Day 6/14)  -Defer gastric emptying study until outpatient when she can tolerate being off Reglan  Hypochloremic AG metabolic acidosis: ESRD On dialysis.  ESRD on HD: Pending follow-up with a new dialysis center closer to new SNF.  Anemia of chronic disease: Presumably stable since labs were not drawn today.  Stenosing flexor synovitis of L 3rd  finger: Follow-up as outpatient with Ortho.  CAD: Restart ASA per Ortho recs.  Diastolic CHF: Stable for now.  HTN: Stable. Follow up as out-pt.   DM2: CBGs well controlled. Discharge on Levermir 9u QHS.  Hypothyroidism: Continue home meds.  FEN:  -Diet: Renal/Carb Modified  DVT prophylaxis: SCDs  CODE STATUS: FULL CODE  Dispo: Awaiting placement in SNF with access to dialysis.  The patient does not have a current PCP (No primary care provider on file.) and does not need an [facility] hospital follow-up appointment after discharge.  The patient does have transportation limitations that hinder transportation to clinic appointments.  .Services Needed at time of discharge: Y = Yes, Blank = No PT: CIR  OT: CIR  RN:   Equipment: 3-in-1 bedside commode, wheelchair  Other:     LOS: 14 days   [facility]ano Arlyce Dice, MD 02/10/2014, 10:59 AM

## 2014-02-11 LAB — GLUCOSE, CAPILLARY
GLUCOSE-CAPILLARY: 82 mg/dL (ref 70–99)
GLUCOSE-CAPILLARY: 224 mg/dL — AB (ref 70–99)
Glucose-Capillary: 56 mg/dL — ABNORMAL LOW (ref 70–99)
Glucose-Capillary: 107 mg/dL — ABNORMAL HIGH (ref 70–99)
Glucose-Capillary: 127 mg/dL — ABNORMAL HIGH (ref 70–99)

## 2014-02-11 NOTE — Progress Notes (Signed)
Patient refusing bed alarm this evening shift.  Re-educated patient on importance of utilizing bed alarm; patient still refused.  Non-skid socks refused.  Bed in lowest position.  Emphasized use of call bell if needed to use the bathroom; verbalized understanding.  Will continue to monitor patient. 

## 2014-02-11 NOTE — Progress Notes (Signed)
Pt CBG re-check 82.

## 2014-02-11 NOTE — Progress Notes (Signed)
Subjective: No complaints this AM other than not sleeping all that well last night. She is awaiting dialysis center placement in close proximity to her SNF.  Objective: Vital signs in last 24 hours: Filed Vitals:   02/10/14 0930 02/10/14 1810 02/10/14 2131 02/11/14 0522  BP: 129/67 150/70 149/73 109/67  Pulse: 81 78 84 82  Temp: 98.8 F (37.1 C) 98.5 F (36.9 C) 98.4 F (36.9 C) 98.3 F (36.8 C)  TempSrc: Oral Oral Oral Oral  Resp: 16 18 18 16   Height:   5\' 3"  (1.6 m)   Weight:   116 lb 4.7 oz (52.75 kg)   SpO2: 96% 98% 94% 96%   Weight change: -2 lb 12.1 oz (-1.25 kg)  Intake/Output Summary (Last 24 hours) at 02/11/14 1324 Last data filed at 02/11/14 0800  Gross per 24 hour  Intake    660 ml  Output      0 ml  Net    660 ml   Physical Exam  General: sitting up in bed, smiling HEENT: EOMI, moist oral mucosa Cardiac: RRR, 3/6 systolic murmur best auscultated at aortic site Pulm: clear to auscultation bilaterally Abd: soft, nontender, nondistended, BS present Ext: right BKA, clean bandage covered/dry/intact, non tender, L foot s/p toe amputations covered in sock  Lab Results: Basic Metabolic Panel:  Recent Labs Lab 02/07/14 1100 02/09/14 0719  NA 134* 135*  K 3.8 4.0  CL 93* 95*  CO2 22 24  GLUCOSE 106* 59*  BUN 37* 20  CREATININE 7.57* 6.51*  CALCIUM 7.9* 8.1*  PHOS 7.8* 5.1*   CBG:  Recent Labs Lab 02/10/14 1146 02/10/14 1610 02/10/14 2130 02/11/14 0757 02/11/14 0837 02/11/14 1201  GLUCAP 220* 146* 180* 56* 82 107*    Medications: I have reviewed the patient's current medications. Scheduled Meds: . cinacalcet  30 mg Oral Q breakfast  . [START ON 02/14/2014] darbepoetin (ARANESP) injection - DIALYSIS  60 mcg Intravenous Q Thu-HD  . doxercalciferol  1 mcg Intravenous Q T,Th,Sa-HD  . feeding supplement (RESOURCE BREEZE)  1 Container Oral BID BM  . [START ON 02/12/2014] ferric gluconate (FERRLECIT/NULECIT) IV  125 mg Intravenous Q T,Th,Sa-HD  .  insulin aspart  0-9 Units Subcutaneous TID WC  . insulin detemir  9 Units Subcutaneous QHS  . levothyroxine  25 mcg Oral QAC breakfast  . metoCLOPramide  5 mg Oral TID AC  . multivitamin  1 tablet Oral QHS  . pantoprazole  40 mg Oral BID  . senna-docusate  1 tablet Oral BID  . sevelamer carbonate  1,600 mg Oral TID WC  . 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 Active Problems:   End stage renal disease   Essential hypertension   DM type 2, uncontrolled, with renal complications   Hypothyroidism   Hyperparathyroidism, secondary renal   Hyperphosphatemia   Anemia in chronic kidney disease   S/P BKA (below knee amputation) unilateral   Buerger's disease  Brandi Arellano is a 58 year old female with DM2, ESRD, HTN, hypothyroidism who was hospitalized for ischemic ulcer of R foot with cellulitis and osteomyelitis likely 2/2 possible Buerger's disease s/p R BKA POD 4 found to have stenosing flexor synovitis of L 3rd digit now with improved nausea likely 2/2 diabetic gastroparesis and hypochloremic anion gap metabolic acidosis.   Ischemic ulcer of R foot with cellulitis and osteomyelitis 2/2  possible Buerger's disease s/p R BKA POD 10:  -Continue pain control: OxyIR 5mg  q6hr PRN -Scheduled for otho follow-up on 11/6 -Per Social Work, awaiting dialysis center placement  Improved nausea likely 2/2 diabetic gastroparesis: -Continue Reglan 5mg  TID (Day 9/14)  -Defer gastric emptying study until outpatient when she can tolerate being off Reglan  Hypochloremic AG metabolic acidosis: ESRD On dialysis.  ESRD on HD: Pending follow-up with a new dialysis center closer to new SNF.  Anemia of chronic disease: Presumably stable since labs were not drawn today.  Stenosing flexor synovitis of L 3rd finger: Follow-up as  outpatient with Ortho.  CAD: Restart ASA per Ortho recs.  Diastolic CHF: Stable for now.  HTN: Normotensive off home meds. Stable  DM2: CBGs well controlled. Discharge on Levermir 9u QHS.  Hypothyroidism: Continue home meds.  FEN:  -Diet: Renal/Carb Modified  DVT prophylaxis: SCDs  CODE STATUS: FULL CODE  Dispo: Awaiting placement in SNF with access to dialysis.  The patient does not have a current PCP (No primary care provider on file.) and does not need an [facility] hospital follow-up appointment after discharge.  The patient does have transportation limitations that hinder transportation to clinic appointments.  .Services Needed at time of discharge: Y = Yes, Blank = No PT: CIR  OT: CIR  RN:   Equipment: 3-in-1 bedside commode, wheelchair  Other:     LOS: 15 days   Charlott Rakes, MD 02/11/2014, 1:24 PM

## 2014-02-11 NOTE — Progress Notes (Signed)
Subjective:   Doesn't like the food. No other complaints  Objective Filed Vitals:   02/10/14 0930 02/10/14 1810 02/10/14 2131 02/11/14 0522  BP: 129/67 150/70 149/73 109/67  Pulse: 81 78 84 82  Temp: 98.8 F (37.1 C) 98.5 F (36.9 C) 98.4 F (36.9 C) 98.3 F (36.8 C)  TempSrc: Oral Oral Oral Oral  Resp: 16 18 18 16   Height:   5\' 3"  (1.6 m)   Weight:   52.75 kg (116 lb 4.7 oz)   SpO2: 96% 98% 94% 96%   Physical Exam General: Alert and oriented. No acute distress. Seems upset about something Heart: RRR 2/6 murmur Lungs: CTA, unlabored  Abdomen: soft, nontender +BS  Extremities: R BKA in post op dressing, L healed transmet. No edema Dialysis Access: R IJ   Dialysis Orders: [place]  4 hr 160 Optiflux 400/600 EDW 55 2K 2.25 Ca right I-J profile 4 Aranesp 40 last given 10/15 - first dose after being on hold x 1 month - Hgb 9.2 10/15 ferritin 158 sat 23% 9/26 - not treated with IV Fe because at that time Hgb was 12; iPTH 268 8/8 on Hectorol 1 - last P 5.5  Assessment/Plan: Assessment/Plan: 1. 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 Ortho follow up scheduled 11/6 2. ESRD - TTS - Has been seen in the past for permanent vascular access, but no plan yet - poor options; K 4.0- catheter was exchanged 10/28 due to flow issues- next HD Tuesday 3. Hypertension/volume - variable 109/67 this am- volume ok. new EDW at 52kg (got to 52.5 yesterday) 4. Anemia - Hb 7.1 10/30 and 10/31 - transfused 2 units PRBC on HD 10/31 Aranesp 60 - ferritin 726 10/31- on weekly IV Fe - will increase to 125 x 4 then weekly 5. Metabolic bone disease - Continue Hectorol -refused binders in pill form - change to powdered renvela while here - she can continue prior renvela after d/c- repeat P now down to 5.1 from 10s! 6. Nutrition - Renal/carb motified diet. multivit Alb 2.6 - ad resource bid 7. Hypothyroidism - on synthroid  8. DM - per primary. on SSI at home - need to update outpt  med list at d/c- hypoglycemic this AM- doesn't like food 9. Pain/ nausea - per primary, concern for gastroparesis. Starting reglan - improved 10. Dispo- waiting onSNF placement 11. Reflux sx - PPI  Shelle Iron, NP [place] [phone] 02/11/2014,9:00 AM  LOS: 15 days   Pt seen, examined and agree w A/P as above.  [name] MD pager [phone]    cell [phone] 02/11/2014, 12:35 PM    Additional Objective Labs: Basic Metabolic Panel:  Recent Labs Lab 02/05/14 1343  02/07/14 0547 02/07/14 1100 02/09/14 0719  NA 133*  < > 133* 134* 135*  K 4.0  < > 3.9 3.8 4.0  CL 91*  < > 92* 93* 95*  CO2 19  < > 19 22 24   GLUCOSE 176*  < > 91 106* 59*  BUN 72*  < > 35* 37* 20  CREATININE 11.04*  < > 7.22* 7.57* 6.51*  CALCIUM 7.7*  < > 8.1* 7.9* 8.1*  PHOS 10.9*  --   --  7.8* 5.1*  < > = values in this interval not displayed. Liver Function Tests:  Recent Labs Lab 02/05/14 1343 02/07/14 1100 02/09/14 0719  ALBUMIN 2.6* 2.6* 2.5*   No results for input(s): LIPASE, AMYLASE in the last 168 hours.  CBC:  Recent Labs Lab 02/05/14 1343 02/06/14 1015 02/07/14 1100 02/09/14 0719  WBC 11.3* 9.4 9.6 8.7  HGB 8.0* 7.9* 7.1* 7.1*  HCT 24.1* 24.0* 21.9* 22.0*  MCV 95.3 96.0 93.6 95.2  PLT 222 208 193 217   Blood Culture    Component Value Date/Time   SDES BLOOD RIGHT HAND 01/27/2014 1722   SPECREQUEST BOTTLES DRAWN AEROBIC AND ANAEROBIC 5CC EA 01/27/2014 1722   CULT  01/27/2014 1722    NO GROWTH 5 DAYS Performed at [facility] 02/02/2014 FINAL 01/27/2014 1722    Cardiac Enzymes: No results for input(s): CKTOTAL, CKMB, CKMBINDEX, TROPONINI in the last 168 hours. CBG:  Recent Labs Lab 02/10/14 1146 02/10/14 1610 02/10/14 2130 02/11/14 0757 02/11/14 0837  GLUCAP 220* 146* 180* 56* 82   Iron Studies:  Recent Labs  02/09/14 0719  FERRITIN 726*   @lablastinr3 @ Studies/Results: No results found. Medications:    . cinacalcet  30 mg Oral Q breakfast  . [START ON 02/14/2014] darbepoetin (ARANESP) injection - DIALYSIS  60 mcg Intravenous Q Thu-HD  . doxercalciferol  1 mcg Intravenous Q T,Th,Sa-HD  . feeding supplement (RESOURCE BREEZE)  1 Container Oral BID BM  . [START ON 02/12/2014] ferric gluconate (FERRLECIT/NULECIT) IV  125 mg Intravenous Q T,Th,Sa-HD  . insulin aspart  0-9 Units Subcutaneous TID WC  . insulin detemir  9 Units Subcutaneous QHS  . levothyroxine  25 mcg Oral QAC breakfast  . metoCLOPramide  5 mg Oral TID AC  . multivitamin  1 tablet Oral QHS  . pantoprazole  40 mg Oral BID  . senna-docusate  1 tablet Oral BID  . sevelamer carbonate  1,600 mg Oral TID WC  . sodium chloride  3 mL Intravenous Q12H

## 2014-02-11 NOTE — Progress Notes (Signed)
PT Cancellation Note  Patient Details Name: Brandi Arellano MRN: [id number] DOB: 06-Nov-1955   Cancelled Treatment:    Reason Eval/Treat Not Completed: Patient declined, no reason specified. Pt states she will not work with PT today. States "there is too much going on" and she "doesn't care to talk about it". Offered pt options and max encouragement to participate however still declines. Will continue to follow.    Rolinda Roan 02/11/2014, 2:30 PM   Rolinda Roan, PT, DPT Acute Rehabilitation Services Pager: [phone]

## 2014-02-11 NOTE — Clinical Social Work Note (Signed)
Bethena Roys with Dialysis called CSW to inform that patient is unable to get dialysis in [place]- center is full and unable to accept.  CSW confirmed that patient is getting dialysis at [facility] currently.  CSW will continue to follow.  Domenica Reamer, [place] Social Worker [phone]

## 2014-02-11 NOTE — Progress Notes (Signed)
Inpatient Diabetes Program Recommendations  AACE/ADA: New Consensus Statement on Inpatient Glycemic Control (2013)  Target Ranges:  Prepandial:   less than 140 mg/dL      Peak postprandial:   less than 180 mg/dL (1-2 hours)      Critically ill patients:  140 - 180 mg/dL     Results for Brandi Arellano, Brandi Arellano (MRN [id number]) as of 02/11/2014 14:14  Ref. Range 02/10/2014 07:35 02/10/2014 07:59 02/10/2014 11:46 02/10/2014 16:10 02/10/2014 21:30  Glucose-Capillary Latest Range: 70-99 mg/dL 52 (L) 73 220 (H) 146 (H) 180 (H)    Results for Brandi Arellano, Brandi Arellano (MRN [id number]) as of 02/11/2014 14:14  Ref. Range 02/11/2014 07:57 02/11/2014 08:37 02/11/2014 12:01  Glucose-Capillary Latest Range: 70-99 mg/dL 56 (L) 82 107 (H)     Hypoglycemic for last 2 days after receiving 9 units Levemir the night before.   MD- Please consider decreasing Levemir to 7 units QHS     Will follow Wyn Quaker RN, MSN, CDE Diabetes Coordinator Inpatient Diabetes Program Team Pager: [phone] (8a-10p)

## 2014-02-11 NOTE — Progress Notes (Signed)
Pt CBG 56, pt drank some juice, maybe half, pt asymptomatic, no IV access and alert and oriented.  Pt refuses to eat states "she aint eating that mess" and reordered food per patient.

## 2014-02-11 NOTE — Progress Notes (Signed)
Patient ID: Brandi Arellano, female   DOB: 1955-05-12, 58 y.o.   MRN: [id number] Medicine attending: I personally interviewed and examined this patient today together with resident physician Dr. Charlott Rakes and I concur with his evaluation and management plan. This is an unfortunate 58 year old woman with hypertension, type 2 diabetes, end-stage renal disease on dialysis, and peripheral vascular disease,  who underwent a recent amputation of her right foot secondary to an underlying ischemic ulcer with associated bone necrosis. She had a previous partial amputation of her left foot. She is recovering well at this time and waiting for suitable arrangements for outpatient dialysis treatments.

## 2014-02-11 NOTE — Progress Notes (Signed)
Patient's BP 168/77. Patient not experiencing any complications. MD notified and no new orders received at the time. Will continue to monitor.

## 2014-02-11 NOTE — Progress Notes (Signed)
INITIAL NUTRITION ASSESSMENT  DOCUMENTATION CODES Per approved criteria  -Not Applicable   INTERVENTION: Continue Resource Breeze po BID, each supplement provides 250 kcal and 9 grams of protein.  Encourage PO intake.  NUTRITION DIAGNOSIS: Increased nutrient needs related to chronic illness, ESRD as evidenced by estimated nutrition needs.   Goal: Pt to meet >/= 90% of their estimated nutrition needs   Monitor:  PO intake, weight trends, labs, I/O's  Reason for Assessment: MST  58 y.o. 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 She has a history of multiple toe amputations due to diabetes.On exam, she has a nickel sized wound to the lateral side of her foot on the right with an 2X2 cm of necrotic tissue also surrounded by a large area of erythema.  10/23-PROCEDURE: Right Below Knee Amputation  Pt reports her appetite is just "ok". Pt reports PTA she had a great appetite and was eating well. Meal completion has varied from 25-100%. Pt's weight has been stable. Will continue to current orders as pt refused additional supplements and snacks. Pt was encouraged to eat her food at meals.   Pt with no observed significant fat or muscle mass loss.  Height: Ht Readings from Last 1 Encounters:  02/10/14 5\' 3"  (1.6 m)    Weight: Wt Readings from Last 1 Encounters:  02/10/14 116 lb 4.7 oz (52.75 kg)    Ideal Body Weight: 117 lbs-Adjusted by BKA  % Ideal Body Weight: 101%  Wt Readings from Last 10 Encounters:  02/10/14 116 lb 4.7 oz (52.75 kg)  12/26/13 125 lb (56.7 kg)  12/14/13 124 lb 11.2 oz (56.564 kg)    Usual Body Weight: 125 lbs  % Usual Body Weight: 93%  BMI:  Body mass index is 20.61 kg/(m^2).  Estimated Nutritional Needs: Kcal: 1700-1900 Protein: 70-85 grams Fluid: 1.2  L/day  Skin: incision right leg, nonpitting RLE edema  Diet Order: Diet renal/carb modified with 1200 ml fluid restriction Diet - low sodium heart healthy  EDUCATION NEEDS: -No education needs identified at this time   Intake/Output Summary (Last 24 hours) at 02/11/14 1211 Last data filed at 02/11/14 0800  Gross per 24 hour  Intake    660 ml  Output      0 ml  Net    660 ml    Last BM: 11/1  Labs:   Recent Labs Lab 02/05/14 1343  02/07/14 0547 02/07/14 1100 02/09/14 0719  NA 133*  < > 133* 134* 135*  K 4.0  < > 3.9 3.8 4.0  CL 91*  < > 92* 93* 95*  CO2 19  < > 19 22 24   BUN 72*  < > 35* 37* 20  CREATININE 11.04*  < > 7.22* 7.57* 6.51*  CALCIUM 7.7*  < > 8.1* 7.9* 8.1*  PHOS 10.9*  --   --  7.8* 5.1*  GLUCOSE 176*  < > 91 106* 59*  < > = values in this interval not displayed.  CBG (last 3)   Recent Labs  02/10/14 2130 02/11/14 0757 02/11/14 0837  GLUCAP 180* 56* 82    Scheduled Meds: . cinacalcet  30 mg Oral Q breakfast  . [START ON 02/14/2014] darbepoetin (ARANESP) injection - DIALYSIS  60 mcg Intravenous Q Thu-HD  . doxercalciferol  1 mcg Intravenous Q  T,Th,Sa-HD  . feeding supplement (RESOURCE BREEZE)  1 Container Oral BID BM  . [START ON 02/12/2014] ferric gluconate (FERRLECIT/NULECIT) IV  125 mg Intravenous Q T,Th,Sa-HD  . insulin aspart  0-9 Units Subcutaneous TID WC  . insulin detemir  9 Units Subcutaneous QHS  . levothyroxine  25 mcg Oral QAC breakfast  . metoCLOPramide  5 mg Oral TID AC  . multivitamin  1 tablet Oral QHS  . pantoprazole  40 mg Oral BID  . senna-docusate  1 tablet Oral BID  . sevelamer carbonate  1,600 mg Oral TID WC  . sodium chloride  3 mL Intravenous Q12H    Continuous Infusions:   Past Medical History  Diagnosis Date  . CAD (coronary artery disease)   . Hypertension   . Diabetes mellitus without complication     Type II  . CKD (chronic kidney disease) stage 4, GFR 15-29 ml/min   . Hyperlipidemia   . CHF  (congestive heart failure)     Acute on chronic diastolic  . UTI (lower urinary tract infection)   . Thyroid disease     Abnormal Thyroid function Test  . Anemia   . DVT (deep venous thrombosis)   . Thrombocytopenia     Past Surgical History  Procedure Laterality Date  . Coronary artery bypass graft    . Cholecystectomy    . Carotid endarterectomy      Bilateral  . Foot amputation Bilateral   . Amputation Right 02/01/2014    Procedure: Right Below Knee Amputation;  Surgeon: Newt Minion, MD;  Location: [facility];  Service: Orthopedics;  Laterality: Right;    Kallie Locks, MS, RD, LDN Pager # [phone] After hours/ weekend pager # [phone]

## 2014-02-12 ENCOUNTER — Encounter (HOSPITAL_COMMUNITY): Payer: Medicaid Other | Admitting: Occupational Therapy

## 2014-02-12 DIAGNOSIS — K3184 Gastroparesis: Secondary | ICD-10-CM

## 2014-02-12 DIAGNOSIS — E1143 Type 2 diabetes mellitus with diabetic autonomic (poly)neuropathy: Secondary | ICD-10-CM | POA: Diagnosis present

## 2014-02-12 LAB — RENAL FUNCTION PANEL
ALBUMIN: 2.4 g/dL — AB (ref 3.5–5.2)
Anion gap: 16 — ABNORMAL HIGH (ref 5–15)
BUN: 34 mg/dL — ABNORMAL HIGH (ref 6–23)
CALCIUM: 8.4 mg/dL (ref 8.4–10.5)
CHLORIDE: 88 meq/L — AB (ref 96–112)
CO2: 25 meq/L (ref 19–32)
CREATININE: 7.86 mg/dL — AB (ref 0.50–1.10)
GFR calc Af Amer: 6 mL/min — ABNORMAL LOW (ref >90)
GFR calc non Af Amer: 5 mL/min — ABNORMAL LOW (ref >90)
Glucose, Bld: 73 mg/dL (ref 70–99)
Phosphorus: 5.4 mg/dL — ABNORMAL HIGH (ref 2.3–4.6)
Potassium: 4.1 mEq/L (ref 3.7–5.3)
SODIUM: 129 meq/L — AB (ref 137–147)

## 2014-02-12 LAB — CBC
HCT: 30.3 % — ABNORMAL LOW (ref 36.0–46.0)
Hemoglobin: 10 g/dL — ABNORMAL LOW (ref 12.0–15.0)
MCH: 30.8 pg (ref 26.0–34.0)
MCHC: 33 g/dL (ref 30.0–36.0)
MCV: 93.2 fL (ref 78.0–100.0)
PLATELETS: 204 10*3/uL (ref 150–400)
RBC: 3.25 MIL/uL — AB (ref 3.87–5.11)
RDW: 14.7 % (ref 11.5–15.5)
WBC: 5.7 10*3/uL (ref 4.0–10.5)

## 2014-02-12 LAB — GLUCOSE, CAPILLARY
GLUCOSE-CAPILLARY: 210 mg/dL — AB (ref 70–99)
Glucose-Capillary: 89 mg/dL (ref 70–99)
Glucose-Capillary: 199 mg/dL — ABNORMAL HIGH (ref 70–99)

## 2014-02-12 MED ORDER — ALTEPLASE 100 MG IV SOLR
4.0000 mg | INTRAVENOUS | Status: AC
  Administered 2014-02-12: 4 mg
  Filled 2014-02-12: qty 4

## 2014-02-12 MED ORDER — DOXERCALCIFEROL 4 MCG/2ML IV SOLN
INTRAVENOUS | Status: AC
  Administered 2014-02-12: 1 ug via INTRAVENOUS
  Filled 2014-02-12: qty 2

## 2014-02-12 NOTE — Progress Notes (Signed)
OT Note:  Pt currently in HD.  Will continue to follow. 02/12/2014 Nestor Lewandowsky, OTR/L Pager: [phone]

## 2014-02-12 NOTE — Progress Notes (Signed)
02/12/2014 2:15 PM Hemodialysis Outpatient Note; this patient has been accepted at the [facility] on a Monday, Wednesday and Friday 2nd shift schedule. The center can begin treatment on Friday the 5th at 10:30 AM. Thank you. Gordy Savers

## 2014-02-12 NOTE — Progress Notes (Signed)
Subjective: Yesterday, she was not accepted in the dialysis center closest to her SNF. Social Work is continuing to work on the situation  This AM, she has no complaints. She was seen in the dialysis unit.   Objective: Vital signs in last 24 hours: Filed Vitals:   02/12/14 0532 02/12/14 0700 02/12/14 0702 02/12/14 0730  BP: 104/64 169/85 151/77 93/57  Pulse: 90 80 77 76  Temp: 98.5 F (36.9 C) 97.2 F (36.2 C)    TempSrc: Oral Oral    Resp: 18 18    Height:      Weight:  120 lb 13 oz (54.8 kg)    SpO2: 100% 100%     Weight change: 0 oz (0.001 kg)  Intake/Output Summary (Last 24 hours) at 02/12/14 0739 Last data filed at 02/12/14 0541  Gross per 24 hour  Intake    300 ml  Output      0 ml  Net    300 ml   Physical Exam  General: sitting up in bed, smiling HEENT: EOMI, moist oral mucosa Cardiac: RRR, 3/6 systolic murmur best auscultated at aortic site Pulm: clear to auscultation bilaterally Abd: soft, nontender, nondistended, BS present Ext: right BKA, clean bandage covered/dry/intact, non tender, L foot s/p toe amputations covered in sock  Lab Results: Basic Metabolic Panel:  Recent Labs Lab 02/07/14 1100 02/09/14 0719  NA 134* 135*  K 3.8 4.0  CL 93* 95*  CO2 22 24  GLUCOSE 106* 59*  BUN 37* 20  CREATININE 7.57* 6.51*  CALCIUM 7.9* 8.1*  PHOS 7.8* 5.1*   CBG:  Recent Labs Lab 02/10/14 2130 02/11/14 0757 02/11/14 0837 02/11/14 1201 02/11/14 1728 02/11/14 2102  GLUCAP 180* 56* 82 107* 224* 127*    Medications: I have reviewed the patient's current medications. Scheduled Meds: . cinacalcet  30 mg Oral Q breakfast  . [START ON 02/14/2014] darbepoetin (ARANESP) injection - DIALYSIS  60 mcg Intravenous Q Thu-HD  . doxercalciferol  1 mcg Intravenous Q T,Th,Sa-HD  . feeding supplement (RESOURCE BREEZE)  1 Container Oral BID BM  . ferric gluconate (FERRLECIT/NULECIT) IV  125 mg Intravenous Q T,Th,Sa-HD  . insulin aspart  0-9 Units Subcutaneous TID  WC  . insulin detemir  9 Units Subcutaneous QHS  . levothyroxine  25 mcg Oral QAC breakfast  . metoCLOPramide  5 mg Oral TID AC  . multivitamin  1 tablet Oral QHS  . pantoprazole  40 mg Oral BID  . senna-docusate  1 tablet Oral BID  . sevelamer carbonate  1,600 mg Oral TID WC  . 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 Ewton is a 58 year old female with DM2, ESRD, HTN, hypothyroidism who was hospitalized for ischemic ulcer of R foot with cellulitis and osteomyelitis likely 2/2 possible Buerger's disease s/p R BKA POD 4 found to have stenosing flexor synovitis of L 3rd digit awaiting disposition.   Ischemic ulcer of R foot with cellulitis and osteomyelitis 2/2 possible Buerger's disease s/p R BKA POD 11:  -Continue pain control: OxyIR 5mg   q6hr PRN -Wound dressing changed today per ortho -Scheduled for otho follow-up on 11/6 -Per Social Work, awaiting dialysis center placement  Improved nausea likely 2/2 diabetic gastroparesis: -Continue Reglan 5mg  TID (Day 10/14)  -Defer gastric emptying study until outpatient when she can tolerate being off Reglan  Hypochloremic AG metabolic acidosis: ESRD On dialysis.  ESRD on HD: Pending follow-up with a new dialysis center closer to new SNF.  Anemia of chronic disease: Presumably stable since labs were not drawn today.  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  CAD: Restart ASA per Ortho recs.  Diastolic CHF: Stable for  now.  HTN: Normotensive off home meds. Stable  DM2: CBGs well controlled. Discharge on Levermir 9u QHS.  Hypothyroidism: Continue home meds.  FEN:  -Diet: Renal/Carb Modified  DVT prophylaxis: SCDs  CODE STATUS: FULL CODE  Dispo: Awaiting placement in SNF with access to dialysis.  The patient does not have a current PCP (No primary care provider on file.) and does not need an [facility] hospital follow-up appointment after discharge.  The patient does have transportation limitations that hinder transportation to clinic appointments.  .Services Needed at time of discharge: Y = Yes, Blank = No PT: CIR  OT: CIR  RN:   Equipment: 3-in-1 bedside commode, wheelchair  Other:     LOS: 16 days   Charlott Rakes, MD 02/12/2014, 7:39 AM

## 2014-02-12 NOTE — Progress Notes (Signed)
Patient ID: Brandi Arellano, female   DOB: 13-Jun-1955, 58 y.o.   MRN: [id number] Mild ischemic changes to the dorsal aspect of the transtibial amputation on the right. We will start dressing changes today. The posterior aspect of the flap is healthy and viable.

## 2014-02-12 NOTE — Clinical Social Work Note (Signed)
CSW spoke with Bethena Roys in dialysis who reported that it is possible for the  dialysis center to accept patient as "transient" - intending to only provide dialysis short term while patient is at rehab and then returning to original dialysis center in [place] upon DC from [facility].  [place] spoke with Fransisco Beau at [facility] who would like to know more about the transient dialysis.  Bethena Roys confirmed that dialysis in [place] can take patient on transient MWF schedule.  CSW will confirm patient is able to leave today- unsure due to wounds.  CSW will  Continue to follow.  Domenica Reamer, [place] Social Worker [phone]

## 2014-02-12 NOTE — Progress Notes (Signed)
Subjective:   Hungry, no other complaints  Objective Filed Vitals:   02/12/14 0830 02/12/14 0900 02/12/14 0930 02/12/14 1000  BP: 119/64 153/85 132/73 109/71  Pulse: 85 83 84 85  Temp:      TempSrc:      Resp:      Height:      Weight:      SpO2:       Physical Exam General: alert and oriented. No acute distress.  Heart: RRR 2/6 mumur  Lungs: CTA, unlabored Abdomen: soft, nontender +BS Extremities: R BKA, dressing changed this am. L healed transmet. No edema  Dialysis Access: R IJ   Dialysis Orders: [place] TTS 4 hr 160 Optiflux 400/600 EDW 55 2K 2.25 Ca right I-J profile 4 Aranesp 40 last given 10/15 - first dose after being on hold x 1 month - Hgb 9.2 10/15 ferritin 158 sat 23% 9/26 - not treated with IV Fe because at that time Hgb was 12; iPTH 268 8/8 on Hectorol 1 - last P 5.5  Assessment/Plan: 1. 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 Ortho follow up scheduled 11/6 2. ESRD - TTS - Has been seen in the past for permanent vascular access, but no plan yet - poor options; K 4.0- catheter was exchanged 10/28 due to flow issues- next HD Tuesday 3. Hypertension/volume - variable 109/71 this am- volume ok. new EDW at 52kg (got to 52.5 yesterday) 4. Anemia - Hb 10 10/30 and 10/31 - transfused 2 units PRBC on HD 10/31 Aranesp 60 - ferritin 726 10/31- on weekly IV Fe - will increase to 125 x 4 then weekly 5. Metabolic bone .disease - corrected Ca+ 9.7 Continue Hectorol -refused binders in pill form - change to powdered renvela while here - she can continue prior renvela after d/c- repeat P now down to 5.4 from 10s! 6. Nutrition - Renal/carb motified diet. multivit Alb 2.4 - ad resource bid 7. Hypothyroidism - on synthroid  8. DM - per primary. on SSI at home - need to update outpt med list at d/c- hypoglycemic this AM- doesn't like food 9. Pain/ nausea - per primary, concern for gastroparesis. Starting reglan - improved 10. Dispo- waiting onSNF  placement- will go to [place] as transient.  11. Reflux sx - PPI  Shelle Iron, NP [place] [phone] 02/12/2014,10:37 AM  LOS: 16 days   Pt seen, examined and agree w A/P as above.  [name] MD pager [phone]    cell [phone] 02/12/2014, 12:00 PM    Additional Objective Labs: Basic Metabolic Panel:  Recent Labs Lab 02/07/14 1100 02/09/14 0719 02/12/14 0734  NA 134* 135* 129*  K 3.8 4.0 4.1  CL 93* 95* 88*  CO2 22 24 25   GLUCOSE 106* 59* 73  BUN 37* 20 34*  CREATININE 7.57* 6.51* 7.86*  CALCIUM 7.9* 8.1* 8.4  PHOS 7.8* 5.1* 5.4*   Liver Function Tests:  Recent Labs Lab 02/07/14 1100 02/09/14 0719 02/12/14 0734  ALBUMIN 2.6* 2.5* 2.4*   No results for input(s): LIPASE, AMYLASE in the last 168 hours. CBC:  Recent Labs Lab 02/05/14 1343 02/06/14 1015 02/07/14 1100 02/09/14 0719 02/12/14 0734  WBC 11.3* 9.4 9.6 8.7 5.7  HGB 8.0* 7.9* 7.1* 7.1* 10.0*  HCT 24.1* 24.0* 21.9* 22.0* 30.3*  MCV 95.3 96.0 93.6 95.2 93.2  PLT 222 208 193 217 204   Blood Culture    Component Value Date/Time   SDES BLOOD RIGHT HAND 01/27/2014  [place] EA 01/27/2014 1722   CULT  01/27/2014 1722    NO GROWTH 5 DAYS Performed at [facility] 02/02/2014 FINAL 01/27/2014 1722    Cardiac Enzymes: No results for input(s): CKTOTAL, CKMB, CKMBINDEX, TROPONINI in the last 168 hours. CBG:  Recent Labs Lab 02/11/14 0757 02/11/14 0837 02/11/14 1201 02/11/14 1728 02/11/14 2102  GLUCAP 56* 82 107* 224* 127*   Iron Studies: No results for input(s): IRON, TIBC, TRANSFERRIN, FERRITIN in the last 72 hours. @lablastinr3 @ Studies/Results: No results found. Medications:   . cinacalcet  30 mg Oral Q breakfast  . [START ON 02/14/2014] darbepoetin (ARANESP) injection - DIALYSIS  60 mcg Intravenous Q Thu-HD  . doxercalciferol  1 mcg Intravenous Q T,Th,Sa-HD  . feeding supplement  (RESOURCE BREEZE)  1 Container Oral BID BM  . ferric gluconate (FERRLECIT/NULECIT) IV  125 mg Intravenous Q T,Th,Sa-HD  . insulin aspart  0-9 Units Subcutaneous TID WC  . insulin detemir  9 Units Subcutaneous QHS  . levothyroxine  25 mcg Oral QAC breakfast  . metoCLOPramide  5 mg Oral TID AC  . multivitamin  1 tablet Oral QHS  . pantoprazole  40 mg Oral BID  . senna-docusate  1 tablet Oral BID  . sevelamer carbonate  1,600 mg Oral TID WC  . sodium chloride  3 mL Intravenous Q12H

## 2014-02-12 NOTE — Clinical Social Work Note (Signed)
Patient is able to DC on Thursday to [place]- unable to DC until then due to dialysis schedule.    CSW will continue to follow.  Domenica Reamer, [place] Social Worker [phone]

## 2014-02-12 NOTE — Procedures (Signed)
I was present at this dialysis session, have reviewed the session itself and made  appropriate changes  [name] MD (pgr) [phone]    [phone] 02/12/2014, 12:00 PM

## 2014-02-12 NOTE — Progress Notes (Signed)
Physical Therapy Treatment Patient Details Name: Brandi Arellano MRN: [id number] DOB: 12-01-1955 Today's Date: 02/12/2014    History of Present Illness S/P R BKA, PMH:  DM2, ESRD, CAD, CHF, HTN, amputation of L forefoot.    PT Comments    Pt only willing to participate in minimal therapeutic exercise and SPT bed>chair. Max education and encouragement provided for OOB mobility and proper positioning of residual limb, however pt refusing any teaching at this time. Will continue to follow and progress as able per POC.   Follow Up Recommendations  CIR;Supervision/Assistance - 24 hour     Equipment Recommendations  Wheelchair (measurements PT);Wheelchair cushion (measurements PT)    Recommendations for Other Services Rehab consult     Precautions / Restrictions Precautions Precautions: Fall Restrictions Weight Bearing Restrictions: No    Mobility  Bed Mobility Overal bed mobility: Needs Assistance Bed Mobility: Supine to Sit     Supine to sit: Supervision     General bed mobility comments: Pt able to transition to EOB with no physical assistance.   Transfers Overall transfer level: Needs assistance Equipment used: Rolling walker (2 wheeled) Transfers: Sit to/from [facility] Sit to Stand: Min guard Stand pivot transfers: Min guard       General transfer comment: Pt refused to don non-skid sock or shoe for SPT to recliner, even with max encouragement and education. Pt able to transition bed to chair with min guard assist for safety.   Ambulation/Gait             General Gait Details: Pt declined ambulation, agreeable to bed>chair only.    Stairs            Wheelchair Mobility    Modified Rankin (Stroke Patients Only)       Balance Overall balance assessment: Needs assistance Sitting-balance support: Feet supported;No upper extremity supported Sitting balance-Leahy Scale: Fair     Standing balance support: Bilateral upper  extremity supported Standing balance-Leahy Scale: Poor                      Cognition Arousal/Alertness: Awake/alert Behavior During Therapy: Flat affect Overall Cognitive Status: History of cognitive impairments - at baseline       Memory: Decreased recall of precautions;Decreased short-term memory              Exercises Amputee Exercises Quad Sets: 10 reps;Both Hip ABduction/ADduction: 10 reps Straight Leg Raises: 10 reps Chair Push Up: 5 reps    General Comments        Pertinent Vitals/Pain Pain Assessment: No/denies pain Pain Intervention(s): Premedicated before session    Home Living                      Prior Function            PT Goals (current goals can now be found in the care plan section) Acute Rehab PT Goals Patient Stated Goal: Regain independence. PT Goal Formulation: With patient/family Time For Goal Achievement: 02/20/14 Potential to Achieve Goals: Good Progress towards PT goals: Progressing toward goals    Frequency  Min 3X/week    PT Plan Current plan remains appropriate    Co-evaluation             End of Session Equipment Utilized During Treatment: Gait belt Activity Tolerance: Patient limited by fatigue Patient left: in chair;with call bell/phone within reach     Time: 1322-1345 PT Time Calculation (min): 23 min  Charges:  $Gait  Training: 8-22 mins $Therapeutic Exercise: 8-22 mins                    G Codes:      Rolinda Roan 2014/03/12, 1:52 PM   Rolinda Roan, PT, DPT Acute Rehabilitation Services Pager: [phone]

## 2014-02-13 LAB — CBC
HEMATOCRIT: 28.5 % — AB (ref 36.0–46.0)
HEMOGLOBIN: 9.3 g/dL — AB (ref 12.0–15.0)
MCH: 30.8 pg (ref 26.0–34.0)
MCHC: 32.6 g/dL (ref 30.0–36.0)
MCV: 94.4 fL (ref 78.0–100.0)
Platelets: 167 10*3/uL (ref 150–400)
RBC: 3.02 MIL/uL — AB (ref 3.87–5.11)
RDW: 14.7 % (ref 11.5–15.5)
WBC: 6.8 10*3/uL (ref 4.0–10.5)

## 2014-02-13 LAB — RENAL FUNCTION PANEL
Albumin: 2.6 g/dL — ABNORMAL LOW (ref 3.5–5.2)
Anion gap: 16 — ABNORMAL HIGH (ref 5–15)
BUN: 23 mg/dL (ref 6–23)
CO2: 23 meq/L (ref 19–32)
Calcium: 8.1 mg/dL — ABNORMAL LOW (ref 8.4–10.5)
Chloride: 89 meq/L — ABNORMAL LOW (ref 96–112)
Creatinine, Ser: 6.34 mg/dL — ABNORMAL HIGH (ref 0.50–1.10)
GFR calc Af Amer: 8 mL/min — ABNORMAL LOW
GFR calc non Af Amer: 7 mL/min — ABNORMAL LOW
Glucose, Bld: 138 mg/dL — ABNORMAL HIGH (ref 70–99)
Phosphorus: 4.6 mg/dL (ref 2.3–4.6)
Potassium: 4.1 meq/L (ref 3.7–5.3)
Sodium: 128 meq/L — ABNORMAL LOW (ref 137–147)

## 2014-02-13 LAB — GLUCOSE, CAPILLARY
GLUCOSE-CAPILLARY: 86 mg/dL (ref 70–99)
GLUCOSE-CAPILLARY: 149 mg/dL — AB (ref 70–99)
GLUCOSE-CAPILLARY: 231 mg/dL — AB (ref 70–99)
Glucose-Capillary: 83 mg/dL (ref 70–99)

## 2014-02-13 MED ORDER — PENTAFLUOROPROP-TETRAFLUOROETH EX AERO: 1.0000 "application " | INHALATION_SPRAY | CUTANEOUS | Status: DC | PRN

## 2014-02-13 MED ORDER — SODIUM CHLORIDE 0.9 % IV SOLN: 100.0000 mL | INTRAVENOUS | Status: DC | PRN

## 2014-02-13 MED ORDER — HEPARIN SODIUM (PORCINE) 1000 UNIT/ML DIALYSIS: 1000.0000 [IU] | INTRAMUSCULAR | Status: DC | PRN

## 2014-02-13 MED ORDER — NEPRO/CARBSTEADY PO LIQD
237.0000 mL | ORAL | Status: DC | PRN
  Filled 2014-02-13: qty 237

## 2014-02-13 MED ORDER — LIDOCAINE-PRILOCAINE 2.5-2.5 % EX CREA
1.0000 "application " | TOPICAL_CREAM | CUTANEOUS | Status: DC | PRN
  Filled 2014-02-13: qty 5

## 2014-02-13 MED ORDER — LIDOCAINE HCL (PF) 1 % IJ SOLN: 5.0000 mL | INTRAMUSCULAR | Status: DC | PRN

## 2014-02-13 MED ORDER — ALTEPLASE 2 MG IJ SOLR
2.0000 mg | Freq: Once | INTRAMUSCULAR | Status: DC | PRN
  Filled 2014-02-13: qty 2

## 2014-02-13 NOTE — Progress Notes (Signed)
Occupational Therapy Treatment Patient Details Name: Yetta Marceaux MRN: [id number] DOB: 02-08-1956 Today's Date: 02/13/2014    History of present illness S/P R BKA, PMH:  DM2, ESRD, CAD, CHF, HTN, amputation of L forefoot.   OT comments  Pt cooperative and in good spirits.  Looking forward to impending discharge to SNF for rehab.  Follow Up Recommendations  SNF;Supervision/Assistance - 24 hour    Equipment Recommendations       Recommendations for Other Services      Precautions / Restrictions Precautions Precautions: Fall Restrictions Weight Bearing Restrictions: No       Mobility Bed Mobility Overal bed mobility: Needs Assistance Bed Mobility: Supine to Sit     Supine to sit: Supervision     General bed mobility comments: no physical assist  Transfers   Equipment used: Rolling walker (2 wheeled) Transfers: Sit to/from [facility] Sit to Stand: Min guard Stand pivot transfers: Supervision       General transfer comment: non skid sock on L foot    Balance                                   ADL Overall ADL's : Needs assistance/impaired     Grooming: Wash/dry hands;Wash/dry face;Brushing hair;Sitting;Set up                   Toilet Transfer: Supervision/safety;Stand-pivot;BSC   Toileting- Clothing Manipulation and Hygiene: Supervision/safety;Sitting/lateral lean       Functional mobility during ADLs: Minimal assistance;Rolling walker General ADL Comments: performed grooming in sitting, ambulated from 3 in1 around bed to stay up in [place] During Therapy: [facility] for tasks assessed/performed Overall Cognitive Status: History of cognitive impairments - at baseline       Memory: Decreased short-term memory               Extremity/Trunk Assessment               Exercises     Shoulder Instructions        General Comments      Pertinent Vitals/ Pain       Pain Assessment: No/denies pain  Home Living                                          Prior Functioning/Environment              Frequency Min 2X/week     Progress Toward Goals  OT Goals(current goals can now be found in the care plan section)  Progress towards OT goals: Progressing toward goals  Acute Rehab OT Goals Patient Stated Goal: Regain independence.  Plan Discharge plan remains appropriate    Co-evaluation                 End of Session     Activity Tolerance Patient tolerated treatment well   Patient Left in chair;with call bell/phone within reach   Nurse Communication          Time: 4008-6761 OT Time Calculation (min): 35 min  Charges: OT General Charges $OT Visit: 1 Procedure  OT Treatments $Self Care/Home Management : 23-37 mins  Malka So 02/13/2014, 10:22 AM  [phone]

## 2014-02-13 NOTE — Plan of Care (Signed)
Problem: Phase II Progression Outcomes Goal: Progress activity as tolerated unless otherwise ordered Outcome: Progressing Pt OOB to chair for 4 hours. Requires moderate one person assist with transfers. Will continue to  monitor

## 2014-02-13 NOTE — Progress Notes (Signed)
Patient refusing bed alarm this evening shift.  Re-educated patient on importance of utilizing bed alarm; patient still refused.  Non-skid socks refused.  Emphasized use of call bell if needed to use the bathroom; verbalized understanding.  Will continue to monitor patient.

## 2014-02-13 NOTE — Progress Notes (Addendum)
Subjective: Yesterday, it was confirmed she would be going tomorrow and will be transitioned to MWF dialysis schedule. As such, she will receive transition dialysis today.   This AM, she is pleasant and happy to go tomorrow.  Objective: Vital signs in last 24 hours: Filed Vitals:   02/13/14 1400 02/13/14 1430 02/13/14 1500 02/13/14 1521  BP: 87/55 132/82 153/84 135/78  Pulse: 77 85 83 80  Temp:    98.2 F (36.8 C)  TempSrc:    Oral  Resp:    16  Height:      Weight:    116 lb 10 oz (52.9 kg)  SpO2:    97%   Weight change: -1 lb 7 oz (-0.651 kg)  Intake/Output Summary (Last 24 hours) at 02/13/14 1805 Last data filed at 02/13/14 1521  Gross per 24 hour  Intake    480 ml  Output   1600 ml  Net  -1120 ml   Physical Exam  General: sitting up in bed, smiling HEENT: EOMI, moist oral mucosa Cardiac: RRR, 3/6 systolic murmur best auscultated at aortic site Pulm: clear to auscultation bilaterally Abd: soft, nontender, nondistended, BS present Ext: right BKA, clean bandage covered/dry/intact, non tender, L foot s/p toe amputations covered in sock  Lab Results: Basic Metabolic Panel:  Recent Labs Lab 02/12/14 0734 02/13/14 1201  NA 129* 128*  K 4.1 4.1  CL 88* 89*  CO2 25 23  GLUCOSE 73 138*  BUN 34* 23  CREATININE 7.86* 6.34*  CALCIUM 8.4 8.1*  PHOS 5.4* 4.6   CBG:  Recent Labs Lab 02/12/14 1141 02/12/14 1613 02/12/14 2041 02/13/14 0752 02/13/14 1139 02/13/14 1603  GLUCAP 89 199* 210* 86 149* 83    Medications: I have reviewed the patient's current medications. Scheduled Meds: . cinacalcet  30 mg Oral Q breakfast  . [START ON 02/14/2014] darbepoetin (ARANESP) injection - DIALYSIS  60 mcg Intravenous Q Thu-HD  . doxercalciferol  1 mcg Intravenous Q T,Th,Sa-HD  . feeding supplement (RESOURCE BREEZE)  1 Container Oral BID BM  . ferric gluconate (FERRLECIT/NULECIT) IV  125 mg Intravenous Q T,Th,Sa-HD  . insulin aspart  0-9 Units Subcutaneous TID WC  .  insulin detemir  9 Units Subcutaneous QHS  . levothyroxine  25 mcg Oral QAC breakfast  . metoCLOPramide  5 mg Oral TID AC  . multivitamin  1 tablet Oral QHS  . pantoprazole  40 mg Oral BID  . senna-docusate  1 tablet Oral BID  . sevelamer carbonate  1,600 mg Oral TID WC  . 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 Active Problems:   End stage renal disease   Essential hypertension   DM type 2, uncontrolled, with renal complications   Hypothyroidism   Hyperparathyroidism, secondary renal   Hyperphosphatemia   Anemia in chronic kidney disease   S/P BKA (below knee amputation) unilateral   Buerger's disease   Gastroparesis due to DM  Brandi Arellano is a 58 year old female with DM2, ESRD, HTN, hypothyroidism who was hospitalized for ischemic ulcer of R foot with cellulitis and osteomyelitis likely 2/2 possible Buerger's disease s/p R BKA POD 12 found to have stenosing flexor synovitis of L 3rd digit awaiting disposition.   Ischemic ulcer of R foot with cellulitis and osteomyelitis 2/2 possible Buerger's disease s/p R BKA POD 12:  -Continue pain control: OxyIR 5mg   q6hr PRN -Wound dressing changed today per ortho -Rescheduled for otho follow-up on 11/13 -Per Social Work, awaiting dialysis center placement  Improved nausea likely 2/2 diabetic gastroparesis: -Continue Reglan 5mg  TID (Day 11/14)  -Defer gastric emptying study until outpatient when she can tolerate being off Reglan  Hypochloremic AG metabolic acidosis: Stable for now and thought to be 2/2 ESRD.  ESRD on HD: Pending follow-up with a new dialysis center closer to new SNF.  Anemia of chronic disease: Presumably stable since labs were not drawn today.  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  CAD: Restart  ASA per Ortho recs.  Diastolic CHF: Stable for now.  HTN: Normotensive off home meds. Stable  DM2: CBGs well controlled. Discharge on Levermir 9u QHS.  Hypothyroidism: Continue home meds.  FEN:  -Diet: Renal/Carb Modified  DVT prophylaxis: SCDs  CODE STATUS: FULL CODE  Dispo: Awaiting placement in SNF with access to dialysis.  The patient does not have a current PCP (No primary care provider on file.) and does not need an [facility] hospital follow-up appointment after discharge.  The patient does have transportation limitations that hinder transportation to clinic appointments.  .Services Needed at time of discharge: Y = Yes, Blank = No PT: CIR  OT: CIR  RN:   Equipment: 3-in-1 bedside commode, wheelchair  Other:     LOS: 17 days   Brandi Rakes, MD 02/13/2014, 6:05 PM

## 2014-02-13 NOTE — Progress Notes (Signed)
Subjective:   Plan to DC to rehab tomorrow, says she misses her friends at [place]. No complaints  Objective Filed Vitals:   02/12/14 1149 02/12/14 1722 02/12/14 2038 02/13/14 0519  BP:  94/59 113/55 109/57  Pulse:  84 86 78  Temp:  98.2 F (36.8 C) 98 F (36.7 C) 98.7 F (37.1 C)  TempSrc:  Oral Oral Oral  Resp:  18 18 18   Height:      Weight: 52.1 kg (114 lb 13.8 oz)  52.101 kg (114 lb 13.8 oz)   SpO2:  100% 98% 97%   Physical Exam General: alert and oriented. No acute distress. Sitting in chair Heart: RRR 2/6 mumur  Lungs: CTA, unlabored Abdomen: soft, nontender +BS Extremities: R BKA, dressing intact. L healed transmet. No edema  Dialysis Access: R IJ   Dialysis Orders: [place] TTS 4 hr 160 Optiflux 400/600 EDW 55 2K 2.25 Ca right I-J profile 4 Aranesp 40 last given 10/15 - first dose after being on hold x 1 month - Hgb 9.2 10/15 ferritin 158 sat 23% 9/26 - not treated with IV Fe because at that time Hgb was 12; iPTH 268 8/8 on Hectorol 1 - last P 5.5  Assessment/Plan: 1. 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 Ortho follow up scheduled 11/6 2. ESRD - TTS - Has been seen in the past for permanent vascular access, but no plan yet - poor options; K 4.1- catheter was exchanged 10/28 due to flow issues- HD today in anticipation of DC tomorrow, then on MWF schedule at [facility] 3. Hypertension/volume - variable 109/57 this am- volume ok. new EDW at 52kg (got to 52.5 yesterday) 4. Anemia - Hb 10- transfused 2 units PRBC on HD 10/31 Aranesp 60 - ferritin 726 10/31- on weekly IV Fe - will increase to 125 x 4 then weekly 5. Metabolic bone .disease - corrected Ca+8.4 Continue Hectorol -refused binders in pill form - change to powdered renvela while here - she can continue prior renvela after d/c- repeat P now down to 5.4 from 10s! 6. Nutrition - Renal/carb motified diet. multivit Alb 2.4 - ad resource bid 7. Hypothyroidism - on synthroid  8. DM - per  primary. on SSI at home - need to update outpt med list at d/c- hypoglycemic this AM- doesn't like food 9. Pain/ nausea - per primary, concern for gastroparesis. Starting reglan - improved 10. Dispo- Dc planned for Thursday- will go to [place] as transient.  11. Reflux sx - PPI  Shelle Iron, NP [place] [phone] 02/13/2014,11:00 AM  LOS: 17 days   Pt seen, examined, agree w assess/plan as above with additions as indicated.  [name] MD pager [phone]    cell [phone] 02/13/2014, 11:12 AM      Additional Objective Labs: Basic Metabolic Panel:  Recent Labs Lab 02/07/14 1100 02/09/14 0719 02/12/14 0734  NA 134* 135* 129*  K 3.8 4.0 4.1  CL 93* 95* 88*  CO2 22 24 25   GLUCOSE 106* 59* 73  BUN 37* 20 34*  CREATININE 7.57* 6.51* 7.86*  CALCIUM 7.9* 8.1* 8.4  PHOS 7.8* 5.1* 5.4*   Liver Function Tests:  Recent Labs Lab 02/07/14 1100 02/09/14 0719 02/12/14 0734  ALBUMIN 2.6* 2.5* 2.4*   No results for input(s): LIPASE, AMYLASE in the last 168 hours. CBC:  Recent Labs Lab 02/07/14 1100 02/09/14 0719 02/12/14 0734  WBC 9.6 8.7 5.7  HGB 7.1* 7.1* 10.0*  HCT 21.9* 22.0*  30.3*  MCV 93.6 95.2 93.2  PLT 193 217 204   Blood Culture    Component Value Date/Time   SDES BLOOD RIGHT HAND 01/27/2014 1722   SPECREQUEST BOTTLES DRAWN AEROBIC AND ANAEROBIC 5CC EA 01/27/2014 1722   CULT  01/27/2014 1722    NO GROWTH 5 DAYS Performed at [place] 02/02/2014 FINAL 01/27/2014 1722    Cardiac Enzymes: No results for input(s): CKTOTAL, CKMB, CKMBINDEX, TROPONINI in the last 168 hours. CBG:  Recent Labs Lab 02/11/14 2102 02/12/14 1141 02/12/14 1613 02/12/14 2041 02/13/14 0752  GLUCAP 127* 89 199* 210* 86   Iron Studies: No results for input(s): IRON, TIBC, TRANSFERRIN, FERRITIN in the last 72 hours. @lablastinr3 @ Studies/Results: No results found. Medications:   . cinacalcet  30 mg Oral Q breakfast   . [START ON 02/14/2014] darbepoetin (ARANESP) injection - DIALYSIS  60 mcg Intravenous Q Thu-HD  . doxercalciferol  1 mcg Intravenous Q T,Th,Sa-HD  . feeding supplement (RESOURCE BREEZE)  1 Container Oral BID BM  . ferric gluconate (FERRLECIT/NULECIT) IV  125 mg Intravenous Q T,Th,Sa-HD  . insulin aspart  0-9 Units Subcutaneous TID WC  . insulin detemir  9 Units Subcutaneous QHS  . levothyroxine  25 mcg Oral QAC breakfast  . metoCLOPramide  5 mg Oral TID AC  . multivitamin  1 tablet Oral QHS  . pantoprazole  40 mg Oral BID  . senna-docusate  1 tablet Oral BID  . sevelamer carbonate  1,600 mg Oral TID WC  . sodium chloride  3 mL Intravenous Q12H

## 2014-02-14 LAB — GLUCOSE, CAPILLARY
GLUCOSE-CAPILLARY: 123 mg/dL — AB (ref 70–99)
Glucose-Capillary: 60 mg/dL — ABNORMAL LOW (ref 70–99)
Glucose-Capillary: 67 mg/dL — ABNORMAL LOW (ref 70–99)
Glucose-Capillary: 91 mg/dL (ref 70–99)

## 2014-02-14 MED ORDER — METOCLOPRAMIDE HCL 5 MG PO TABS: 5.0000 mg | ORAL_TABLET | Freq: Three times a day (TID) | ORAL | Status: DC

## 2014-02-14 NOTE — Plan of Care (Signed)
Problem: Phase II Progression Outcomes Goal: Transfers with moderate assist Outcome: Completed/Met Date Met:  02/14/14

## 2014-02-14 NOTE — Clinical Social Work Note (Signed)
CSW spoke with Fransisco Beau at [facility]- patient is still ok to come to their facility on 11/5.    CSW will continue to follow.  Domenica Reamer, [place] Social Worker [phone]

## 2014-02-14 NOTE — Progress Notes (Signed)
Patient Discharge:  Disposition: Discharged to [facility]  Education: [facility] sent to SNF  IV: N/A  Telemetry: N/A  Transportation: [facility]  Belongings: All patient belongings at bedside taken with patient

## 2014-02-14 NOTE — Progress Notes (Signed)
Subjective: This AM, she has no complaints and thanks [place] all for taking part in her care. Her follow-up appointments are being schedule Tuesday to Thursday to allow for her to make it to dialysis.  Objective: Vital signs in last 24 hours: Filed Vitals:   02/13/14 1521 02/13/14 1805 02/13/14 2105 02/14/14 0444  BP: 135/78 116/101 98/52 106/50  Pulse: 80 92  78  Temp: 98.2 F (36.8 C) 98.7 F (37.1 C) 98.4 F (36.9 C) 98.6 F (37 C)  TempSrc: Oral Oral Oral Oral  Resp: 16 16 16 16   Height:      Weight: 116 lb 10 oz (52.9 kg)     SpO2: 97% 100% 98% 96%   Weight change: 5 lb 8.2 oz (2.5 kg)  Intake/Output Summary (Last 24 hours) at 02/14/14 0718 Last data filed at 02/14/14 0644  Gross per 24 hour  Intake    480 ml  Output   1500 ml  Net  -1020 ml   Physical Exam  General: sitting up in bed, wearing pajamas, smiling HEENT: EOMI, moist mucous membranes Cardiac: RRR, 3/6 systolic murmur best auscultated at aortic site Pulm: clear to auscultation bilaterally Abd: soft, nontender, nondistended, BS present Ext: right BKA, clean bandage covered/dry/intact, non tender, L foot s/p toe amputations covered in sock  Lab Results: Basic Metabolic Panel:  Recent Labs Lab 02/12/14 0734 02/13/14 1201  NA 129* 128*  K 4.1 4.1  CL 88* 89*  CO2 25 23  GLUCOSE 73 138*  BUN 34* 23  CREATININE 7.86* 6.34*  CALCIUM 8.4 8.1*  PHOS 5.4* 4.6   CBG:  Recent Labs Lab 02/12/14 1613 02/12/14 2041 02/13/14 0752 02/13/14 1139 02/13/14 1603 02/13/14 2103  GLUCAP 199* 210* 86 149* 83 231*    Medications: I have reviewed the patient's current medications. Scheduled Meds: . cinacalcet  30 mg Oral Q breakfast  . darbepoetin (ARANESP) injection - DIALYSIS  60 mcg Intravenous Q Thu-HD  . doxercalciferol  1 mcg Intravenous Q T,Th,Sa-HD  . feeding supplement (RESOURCE BREEZE)  1 Container Oral BID BM  . ferric gluconate (FERRLECIT/NULECIT) IV  125 mg Intravenous Q T,Th,Sa-HD  .  insulin aspart  0-9 Units Subcutaneous TID WC  . insulin detemir  9 Units Subcutaneous QHS  . levothyroxine  25 mcg Oral QAC breakfast  . metoCLOPramide  5 mg Oral TID AC  . multivitamin  1 tablet Oral QHS  . pantoprazole  40 mg Oral BID  . senna-docusate  1 tablet Oral BID  . sevelamer carbonate  1,600 mg Oral TID WC  . 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 Active Problems:   End stage renal disease   Essential hypertension   DM type 2, uncontrolled, with renal complications   Hypothyroidism   Hyperparathyroidism, secondary renal   Hyperphosphatemia   Anemia in chronic kidney disease   S/P BKA (below knee amputation) unilateral   Buerger's disease   Gastroparesis due to DM  Brandi Arellano is a 58 year old female with DM2, ESRD, HTN, hypothyroidism who was hospitalized for ischemic ulcer of R foot with cellulitis and osteomyelitis likely 2/2 possible Buerger's disease s/p R BKA POD 13 found to have stenosing flexor synovitis of L 3rd digit awaiting disposition.   Ischemic ulcer of R foot with cellulitis and osteomyelitis 2/2 possible Buerger's disease s/p R BKA POD 13:  -  Continue pain control: OxyIR 5mg  q6hr PRN -Rescheduled for otho follow-up on 11/13 -Per Social Work, awaiting dialysis center placement  Improved nausea likely 2/2 diabetic gastroparesis: -Continue Reglan 5mg  TID (Day 12/14)  -Defer gastric emptying study until outpatient when she can tolerate being off Reglan  Hypochloremic AG metabolic acidosis: Stable for now and thought to be 2/2 ESRD.  ESRD on HD: Pending follow-up with a new dialysis center closer to new SNF.  Anemia of chronic disease: Presumably stable since labs were not drawn today.  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  CAD:  Resume ASA at discharge.  Diastolic CHF: Stable for now.  HTN: Normotensive off home meds. Stable  DM2: CBGs well controlled. Discharge on Levermir 9u QHS.  Hypothyroidism: Continue home meds.  FEN:  -Diet: Renal/Carb Modified  DVT prophylaxis: SCDs  CODE STATUS: FULL CODE  Dispo: Awaiting placement in SNF with access to dialysis.  The patient does not have a current PCP (No primary care provider on file.) and does not need an [facility] hospital follow-up appointment after discharge.  The patient does have transportation limitations that hinder transportation to clinic appointments.  .Services Needed at time of discharge: Y = Yes, Blank = No PT: CIR  OT: CIR  RN:   Equipment: 3-in-1 bedside commode, wheelchair  Other:     LOS: 18 days   Brandi Rakes, MD 02/14/2014, 7:18 AM

## 2014-02-14 NOTE — Progress Notes (Signed)
Report called charge nurse at [facility] and [place].

## 2014-02-14 NOTE — Discharge Instructions (Signed)
Thank you for trusting [place] with your medical care!  You were hospitalized for ischemic foot wound of your right foot with skin and bone infection. You were treated with antibiotics and surgery.  To establish with a regular doctor, please come to [facility] on Tuesday, 02/19/14 at 2:45PM. They will help you with making your blood sugar and blood pressure are under control and establish you with our clinic.  Please follow-up with Dr. Sharol Given on Thursday, 02/21/14 at 1:00PM to have the dressing from your right leg removed. Leave the dressing on until your appointment and keep the dressing clean and dry. You may bath yourself like you did in the hospital on the day of your discharge, but it is important to keep your dressing DRY.  For pain, we are prescribing oxycodone. If you need more, please tell Dr. Sharol Given.   For nausea, please continue taking the Reglan until you run out of medication.  Please follow-up with your dialysis center tomorrow.

## 2014-02-14 NOTE — Progress Notes (Signed)
Subjective:   IN good spirits, looking forward to dc  Objective Filed Vitals:   02/13/14 1805 02/13/14 2105 02/14/14 0444 02/14/14 1000  BP: 116/101 98/52 106/50 134/69  Pulse: 92  78 78  Temp: 98.7 F (37.1 C) 98.4 F (36.9 C) 98.6 F (37 C) 98.2 F (36.8 C)  TempSrc: Oral Oral Oral Oral  Resp: 16 16 16 18   Height:      Weight:      SpO2: 100% 98% 96% 96%   Physical Exam General: alert and oriented. No acute distress. Sitting in chair Heart: RRR 2/6 mumur  Lungs: CTA, unlabored Abdomen: soft, nontender +BS Extremities: R BKA, dressing intact. L healed transmet. No edema  Dialysis Access: R IJ   Dialysis Orders: [facility] 4 hr 160 Optiflux 400/600 EDW 55 2K 2.25 Ca right I-J profile 4 Aranesp 40 last given 10/15 - first dose after being on hold x 1 month - Hgb 9.2 10/15 ferritin 158 sat 23% 9/26 - not treated with IV Fe because at that time Hgb was 12; iPTH 268 8/8 on Hectorol 1 - last P 5.5  Assessment/Plan: 1. 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 Ortho follow up scheduled 11/6 2. ESRD - TTS - Has been seen in the past for permanent vascular access, but no plan yet - poor options; K 4.1- catheter was exchanged 10/28 due to flow issues- HD today in anticipation of DC tomorrow, then on MWF schedule at [facility] 3. Hypertension/volume - new EDW at 52.5 kg 4. Anemia - Hb 10- transfused 2 units PRBC on HD 10/31 Aranesp 60 - ferritin 726 10/31- on weekly IV Fe - will increase to 125 x 4 then weekly 5. Metabolic bone .disease - corrected Ca+8.4 Continue Hectorol -refused binders in pill form - change to powdered renvela while here - she can continue prior renvela after d/c- repeat P now down to 5.4 from 10s! 6. Nutrition - Renal/carb motified diet. multivit Alb 2.4 - ad resource bid 7. Hypothyroidism - on synthroid  8. DM - per primary. on SSI at home - need to update outpt med list at d/c- hypoglycemic this AM- doesn't like food 9. Pain/ nausea -  per primary, concern for gastroparesis. Starting reglan - improved 10. Dispo- Dc planned for today- will go to [facility] on MWF schedule starting tomorrow for OP HD 11. Reflux sx - PPI  [name] MD (pgr) [phone]    ([phone] 02/14/2014, 11:56 AM          Additional Objective Labs: Basic Metabolic Panel:  Recent Labs Lab 02/09/14 0719 02/12/14 0734 02/13/14 1201  NA 135* 129* 128*  K 4.0 4.1 4.1  CL 95* 88* 89*  CO2 24 25 23   GLUCOSE 59* 73 138*  BUN 20 34* 23  CREATININE 6.51* 7.86* 6.34*  CALCIUM 8.1* 8.4 8.1*  PHOS 5.1* 5.4* 4.6   Liver Function Tests:  Recent Labs Lab 02/09/14 0719 02/12/14 0734 02/13/14 1201  ALBUMIN 2.5* 2.4* 2.6*   No results for input(s): LIPASE, AMYLASE in the last 168 hours. CBC:  Recent Labs Lab 02/09/14 0719 02/12/14 0734 02/13/14 1201  WBC 8.7 5.7 6.8  HGB 7.1* 10.0* 9.3*  HCT 22.0* 30.3* 28.5*  MCV 95.2 93.2 94.4  PLT 217 204 167   Blood Culture    Component Value Date/Time   SDES BLOOD RIGHT HAND 01/27/2014 1722   SPECREQUEST BOTTLES DRAWN AEROBIC AND ANAEROBIC 5CC EA 01/27/2014 1722   CULT  01/27/2014 1722    NO GROWTH 5 DAYS Performed at [facility] 02/02/2014 FINAL 01/27/2014 1722    Cardiac Enzymes: No results for input(s): CKTOTAL, CKMB, CKMBINDEX, TROPONINI in the last 168 hours. CBG:  Recent Labs Lab 02/13/14 1603 02/13/14 2103 02/14/14 0747 02/14/14 0826 02/14/14 0939  GLUCAP 83 231* 67* 60* 91   Iron Studies: No results for input(s): IRON, TIBC, TRANSFERRIN, FERRITIN in the last 72 hours. @lablastinr3 @ Studies/Results: No results found. Medications:   . cinacalcet  30 mg Oral Q breakfast  . darbepoetin (ARANESP) injection - DIALYSIS  60 mcg Intravenous Q Thu-HD  . doxercalciferol  1 mcg Intravenous Q T,Th,Sa-HD  . feeding supplement (RESOURCE BREEZE)  1 Container Oral BID BM  . ferric gluconate (FERRLECIT/NULECIT) IV  125 mg Intravenous Q T,Th,Sa-HD  .  insulin aspart  0-9 Units Subcutaneous TID WC  . insulin detemir  9 Units Subcutaneous QHS  . levothyroxine  25 mcg Oral QAC breakfast  . metoCLOPramide  5 mg Oral TID AC  . multivitamin  1 tablet Oral QHS  . pantoprazole  40 mg Oral BID  . senna-docusate  1 tablet Oral BID  . sevelamer carbonate  1,600 mg Oral TID WC  . sodium chloride  3 mL Intravenous Q12H

## 2014-02-14 NOTE — Clinical Social Work Note (Addendum)
CSW spoke with patient's brother about hospital follow up appointments and brother reports that they are able to take the patient to her follow up appointments.  Patient will discharge to [facility] Anticipated discharge date: 02/14/2014 Family notified: Mr. [name]- brother Transportation by [facility]- called at 1:30  [place] signing off.  Domenica Reamer, [place] Social Worker [phone]

## 2014-02-14 NOTE — Progress Notes (Signed)
Hypoglycemic Event  CBG: 67 at 0747 CBG 60 0826  Treatment: 15 GM carbohydrate snack & Pt ate breakfast  Symptoms: None  Follow-up CBG: [id number] CBG Result:91  Possible Reasons for Event: Inadequate meal intake  Comments/MD notified:Dr. Corwin Levins M  Remember to initiate Hypoglycemia Order Set & complete

## 2014-02-15 ENCOUNTER — Encounter (HOSPITAL_BASED_OUTPATIENT_CLINIC_OR_DEPARTMENT_OTHER): Payer: Medicaid Other | Attending: General Surgery

## 2014-02-19 ENCOUNTER — Ambulatory Visit: Payer: Medicaid Other | Admitting: Internal Medicine

## 2014-02-19 ENCOUNTER — Encounter: Payer: Self-pay | Admitting: Internal Medicine

## 2014-03-19 ENCOUNTER — Other Ambulatory Visit (HOSPITAL_COMMUNITY): Payer: Self-pay | Admitting: Orthopedic Surgery

## 2014-03-19 ENCOUNTER — Encounter (HOSPITAL_COMMUNITY): Payer: Self-pay | Admitting: *Deleted

## 2014-03-19 MED ORDER — CEFAZOLIN SODIUM-DEXTROSE 2-3 GM-% IV SOLR
2.0000 g | INTRAVENOUS | Status: DC
  Filled 2014-03-19: qty 50

## 2014-03-19 NOTE — Progress Notes (Signed)
SDW-pre-op call completed by pt nurse Megan. Please complete Apnea screening and anesthesia assessment on DOS.

## 2014-03-20 ENCOUNTER — Encounter (HOSPITAL_COMMUNITY): Payer: Self-pay | Admitting: *Deleted

## 2014-03-20 ENCOUNTER — Ambulatory Visit (HOSPITAL_COMMUNITY): Payer: Medicaid Other | Admitting: Anesthesiology

## 2014-03-20 ENCOUNTER — Encounter (HOSPITAL_COMMUNITY)
Admission: RE | Disposition: A | Discharge status: Skilled Nursing Facility | Payer: Self-pay | Source: Ambulatory Visit | Attending: Orthopedic Surgery

## 2014-03-20 ENCOUNTER — Inpatient Hospital Stay (HOSPITAL_COMMUNITY)
Admission: RE | Admit: 2014-03-20 | Discharge: 2014-03-27 | DRG: 907 | Disposition: A | Discharge status: Skilled Nursing Facility | Payer: Medicaid Other | Source: Ambulatory Visit | Attending: Orthopedic Surgery | Admitting: Orthopedic Surgery

## 2014-03-20 DIAGNOSIS — I120 Hypertensive chronic kidney disease with stage 5 chronic kidney disease or end stage renal disease: Secondary | ICD-10-CM | POA: Diagnosis present

## 2014-03-20 DIAGNOSIS — E1152 Type 2 diabetes mellitus with diabetic peripheral angiopathy with gangrene: Secondary | ICD-10-CM

## 2014-03-20 DIAGNOSIS — I5030 Unspecified diastolic (congestive) heart failure: Secondary | ICD-10-CM | POA: Diagnosis present

## 2014-03-20 DIAGNOSIS — E1122 Type 2 diabetes mellitus with diabetic chronic kidney disease: Secondary | ICD-10-CM | POA: Diagnosis present

## 2014-03-20 DIAGNOSIS — N2581 Secondary hyperparathyroidism of renal origin: Secondary | ICD-10-CM | POA: Diagnosis present

## 2014-03-20 DIAGNOSIS — N186 End stage renal disease: Secondary | ICD-10-CM | POA: Diagnosis present

## 2014-03-20 DIAGNOSIS — E785 Hyperlipidemia, unspecified: Secondary | ICD-10-CM | POA: Diagnosis present

## 2014-03-20 DIAGNOSIS — Z91041 Radiographic dye allergy status: Secondary | ICD-10-CM

## 2014-03-20 DIAGNOSIS — I96 Gangrene, not elsewhere classified: Secondary | ICD-10-CM | POA: Diagnosis present

## 2014-03-20 DIAGNOSIS — Y835 Amputation of limb(s) as the cause of abnormal reaction of the patient, or of later complication, without mention of misadventure at the time of the procedure: Secondary | ICD-10-CM | POA: Diagnosis present

## 2014-03-20 DIAGNOSIS — Z992 Dependence on renal dialysis: Secondary | ICD-10-CM | POA: Diagnosis not present

## 2014-03-20 DIAGNOSIS — Z888 Allergy status to other drugs, medicaments and biological substances status: Secondary | ICD-10-CM

## 2014-03-20 DIAGNOSIS — L03115 Cellulitis of right lower limb: Secondary | ICD-10-CM | POA: Diagnosis present

## 2014-03-20 DIAGNOSIS — L7682 Other postprocedural complications of skin and subcutaneous tissue: Principal | ICD-10-CM | POA: Diagnosis present

## 2014-03-20 DIAGNOSIS — Z87891 Personal history of nicotine dependence: Secondary | ICD-10-CM

## 2014-03-20 DIAGNOSIS — I2510 Atherosclerotic heart disease of native coronary artery without angina pectoris: Secondary | ICD-10-CM | POA: Diagnosis present

## 2014-03-20 DIAGNOSIS — Z885 Allergy status to narcotic agent status: Secondary | ICD-10-CM

## 2014-03-20 HISTORY — DX: Gangrene, not elsewhere classified: I96

## 2014-03-20 HISTORY — DX: Type 2 diabetes mellitus with diabetic peripheral angiopathy with gangrene: E11.52

## 2014-03-20 LAB — COMPREHENSIVE METABOLIC PANEL
ALBUMIN: 2.5 g/dL — AB (ref 3.5–5.2)
ALT: 14 U/L (ref 0–35)
ANION GAP: 19 — AB (ref 5–15)
AST: 10 U/L (ref 0–37)
Alkaline Phosphatase: 143 U/L — ABNORMAL HIGH (ref 39–117)
BUN: 63 mg/dL — ABNORMAL HIGH (ref 6–23)
CO2: 25 mEq/L (ref 19–32)
CREATININE: 6.88 mg/dL — AB (ref 0.50–1.10)
Calcium: 7.9 mg/dL — ABNORMAL LOW (ref 8.4–10.5)
Chloride: 87 mEq/L — ABNORMAL LOW (ref 96–112)
GFR calc Af Amer: 7 mL/min — ABNORMAL LOW (ref >90)
GFR, EST NON AFRICAN AMERICAN: 6 mL/min — AB (ref >90)
Glucose, Bld: 167 mg/dL — ABNORMAL HIGH (ref 70–99)
Potassium: 4.5 mEq/L (ref 3.7–5.3)
Sodium: 131 mEq/L — ABNORMAL LOW (ref 137–147)
Total Bilirubin: 0.2 mg/dL — ABNORMAL LOW (ref 0.3–1.2)
Total Protein: 7.2 g/dL (ref 6.0–8.3)

## 2014-03-20 LAB — CBC
HCT: 22.6 % — ABNORMAL LOW (ref 36.0–46.0)
HCT: 22.2 % — ABNORMAL LOW (ref 36.0–46.0)
HEMOGLOBIN: 7.1 g/dL — AB (ref 12.0–15.0)
Hemoglobin: 7.4 g/dL — ABNORMAL LOW (ref 12.0–15.0)
MCH: 31.6 pg (ref 26.0–34.0)
MCH: 31 pg (ref 26.0–34.0)
MCHC: 32.7 g/dL (ref 30.0–36.0)
MCHC: 32 g/dL (ref 30.0–36.0)
MCV: 96.6 fL (ref 78.0–100.0)
MCV: 96.9 fL (ref 78.0–100.0)
Platelets: 303 10*3/uL (ref 150–400)
Platelets: 311 10*3/uL (ref 150–400)
RBC: 2.34 MIL/uL — ABNORMAL LOW (ref 3.87–5.11)
RBC: 2.29 MIL/uL — AB (ref 3.87–5.11)
RDW: 17.2 % — AB (ref 11.5–15.5)
RDW: 17.3 % — ABNORMAL HIGH (ref 11.5–15.5)
WBC: 13.7 10*3/uL — AB (ref 4.0–10.5)
WBC: 11.9 10*3/uL — ABNORMAL HIGH (ref 4.0–10.5)

## 2014-03-20 LAB — RENAL FUNCTION PANEL
ANION GAP: 16 — AB (ref 5–15)
Albumin: 2.3 g/dL — ABNORMAL LOW (ref 3.5–5.2)
BUN: 66 mg/dL — ABNORMAL HIGH (ref 6–23)
CO2: 27 meq/L (ref 19–32)
Calcium: 7.9 mg/dL — ABNORMAL LOW (ref 8.4–10.5)
Chloride: 86 mEq/L — ABNORMAL LOW (ref 96–112)
Creatinine, Ser: 6.87 mg/dL — ABNORMAL HIGH (ref 0.50–1.10)
GFR calc non Af Amer: 6 mL/min — ABNORMAL LOW (ref >90)
GFR, EST AFRICAN AMERICAN: 7 mL/min — AB (ref >90)
Glucose, Bld: 129 mg/dL — ABNORMAL HIGH (ref 70–99)
PHOSPHORUS: 5.3 mg/dL — AB (ref 2.3–4.6)
POTASSIUM: 4.5 meq/L (ref 3.7–5.3)
Sodium: 129 mEq/L — ABNORMAL LOW (ref 137–147)

## 2014-03-20 LAB — PROTIME-INR
INR: 1.17 (ref 0.00–1.49)
PROTHROMBIN TIME: 15.1 s (ref 11.6–15.2)

## 2014-03-20 LAB — GLUCOSE, CAPILLARY
GLUCOSE-CAPILLARY: 162 mg/dL — AB (ref 70–99)
GLUCOSE-CAPILLARY: 129 mg/dL — AB (ref 70–99)
Glucose-Capillary: 143 mg/dL — ABNORMAL HIGH (ref 70–99)
Glucose-Capillary: 89 mg/dL (ref 70–99)

## 2014-03-20 LAB — PREPARE RBC (CROSSMATCH)

## 2014-03-20 LAB — APTT: aPTT: 30 seconds (ref 24–37)

## 2014-03-20 SURGERY — AMPUTATION, ABOVE KNEE
Anesthesia: General

## 2014-03-20 MED ORDER — SODIUM CHLORIDE 0.9 % IV SOLN: 100.0000 mL | INTRAVENOUS | Status: DC | PRN

## 2014-03-20 MED ORDER — HEPARIN SODIUM (PORCINE) 1000 UNIT/ML DIALYSIS
1000.0000 [IU] | INTRAMUSCULAR | Status: DC | PRN
  Filled 2014-03-20: qty 1

## 2014-03-20 MED ORDER — BOOST / RESOURCE BREEZE PO LIQD: 1.0000 | Freq: Two times a day (BID) | ORAL | Status: DC

## 2014-03-20 MED ORDER — DARBEPOETIN ALFA 200 MCG/0.4ML IJ SOSY
200.0000 ug | PREFILLED_SYRINGE | INTRAMUSCULAR | Status: DC
  Administered 2014-03-20 – 2014-03-27 (×2): 200 ug via INTRAVENOUS
  Filled 2014-03-20: qty 0.4

## 2014-03-20 MED ORDER — SODIUM CHLORIDE 0.9 % IV SOLN
INTRAVENOUS | Status: AC | PRN
  Administered 2014-03-20 – 2017-02-07 (×3): via INTRAVENOUS

## 2014-03-20 MED ORDER — LIDOCAINE HCL (CARDIAC) 20 MG/ML IV SOLN
INTRAVENOUS | Status: AC
  Filled 2014-03-20: qty 5

## 2014-03-20 MED ORDER — EPHEDRINE SULFATE 50 MG/ML IJ SOLN
INTRAMUSCULAR | Status: AC
  Filled 2014-03-20: qty 1

## 2014-03-20 MED ORDER — SODIUM CHLORIDE 0.9 % IV SOLN: Freq: Once | INTRAVENOUS | Status: DC

## 2014-03-20 MED ORDER — INSULIN DETEMIR 100 UNIT/ML ~~LOC~~ SOLN
9.0000 [IU] | Freq: Every day | SUBCUTANEOUS | Status: DC
  Administered 2014-03-21 – 2014-03-24 (×4): 9 [IU] via SUBCUTANEOUS
  Filled 2014-03-20 (×7): qty 0.09

## 2014-03-20 MED ORDER — FENTANYL CITRATE 0.05 MG/ML IJ SOLN
25.0000 ug | INTRAMUSCULAR | Status: DC | PRN
  Administered 2014-03-24 (×2): 25 ug via INTRAVENOUS

## 2014-03-20 MED ORDER — LIDOCAINE-PRILOCAINE 2.5-2.5 % EX CREA: 1.0000 "application " | TOPICAL_CREAM | CUTANEOUS | Status: DC | PRN

## 2014-03-20 MED ORDER — INSULIN ASPART 100 UNIT/ML ~~LOC~~ SOLN
0.0000 [IU] | Freq: Three times a day (TID) | SUBCUTANEOUS | Status: DC
  Administered 2014-03-22: 2 [IU] via SUBCUTANEOUS
  Administered 2014-03-23: 1 [IU] via SUBCUTANEOUS
  Administered 2014-03-24: 5 [IU] via SUBCUTANEOUS
  Administered 2014-03-26: 1 [IU] via SUBCUTANEOUS

## 2014-03-20 MED ORDER — SODIUM CHLORIDE 0.9 % IV SOLN: 250.0000 mL | INTRAVENOUS | Status: DC | PRN

## 2014-03-20 MED ORDER — ONDANSETRON HCL 4 MG/2ML IJ SOLN
INTRAMUSCULAR | Status: AC
Start: 2014-03-20 — End: 2014-03-20
  Filled 2014-03-20: qty 2

## 2014-03-20 MED ORDER — DARBEPOETIN ALFA 200 MCG/0.4ML IJ SOSY
PREFILLED_SYRINGE | INTRAMUSCULAR | Status: AC
  Administered 2014-03-20: 200 ug
  Filled 2014-03-20: qty 0.4

## 2014-03-20 MED ORDER — PANTOPRAZOLE SODIUM 40 MG PO TBEC
40.0000 mg | DELAYED_RELEASE_TABLET | Freq: Every day | ORAL | Status: DC
  Administered 2014-03-21 – 2014-03-27 (×6): 40 mg via ORAL
  Filled 2014-03-20 (×6): qty 1

## 2014-03-20 MED ORDER — OXYCODONE HCL 5 MG PO TABS
5.0000 mg | ORAL_TABLET | Freq: Four times a day (QID) | ORAL | Status: DC | PRN
  Administered 2014-03-21 – 2014-03-22 (×5): 5 mg via ORAL
  Filled 2014-03-20 (×6): qty 1

## 2014-03-20 MED ORDER — NEPRO/CARBSTEADY PO LIQD: 237.0000 mL | ORAL | Status: DC | PRN

## 2014-03-20 MED ORDER — LEVOTHYROXINE SODIUM 25 MCG PO TABS
25.0000 ug | ORAL_TABLET | Freq: Every day | ORAL | Status: DC
  Administered 2014-03-21 – 2014-03-27 (×7): 25 ug via ORAL
  Filled 2014-03-20 (×8): qty 1

## 2014-03-20 MED ORDER — MIDAZOLAM HCL 2 MG/2ML IJ SOLN
INTRAMUSCULAR | Status: AC
  Filled 2014-03-20: qty 2

## 2014-03-20 MED ORDER — 0.9 % SODIUM CHLORIDE (POUR BTL) OPTIME
TOPICAL | Status: DC | PRN
  Administered 2014-03-20: 1000 mL

## 2014-03-20 MED ORDER — CINACALCET HCL 30 MG PO TABS
30.0000 mg | ORAL_TABLET | Freq: Every day | ORAL | Status: DC
  Administered 2014-03-21 – 2014-03-26 (×6): 30 mg via ORAL
  Filled 2014-03-20 (×8): qty 1

## 2014-03-20 MED ORDER — NA FERRIC GLUC CPLX IN SUCROSE 12.5 MG/ML IV SOLN
125.0000 mg | INTRAVENOUS | Status: DC
  Administered 2014-03-20 – 2014-03-27 (×2): 125 mg via INTRAVENOUS
  Filled 2014-03-20 (×3): qty 10

## 2014-03-20 MED ORDER — FENTANYL CITRATE 0.05 MG/ML IJ SOLN
INTRAMUSCULAR | Status: AC
  Filled 2014-03-20: qty 5

## 2014-03-20 MED ORDER — SODIUM CHLORIDE 0.9 % IJ SOLN
3.0000 mL | Freq: Two times a day (BID) | INTRAMUSCULAR | Status: DC
  Administered 2014-03-23: 3 mL via INTRAVENOUS

## 2014-03-20 MED ORDER — LIDOCAINE HCL (PF) 1 % IJ SOLN: 5.0000 mL | INTRAMUSCULAR | Status: DC | PRN

## 2014-03-20 MED ORDER — ALTEPLASE 2 MG IJ SOLR
2.0000 mg | Freq: Once | INTRAMUSCULAR | Status: DC | PRN
  Filled 2014-03-20: qty 2

## 2014-03-20 MED ORDER — SODIUM CHLORIDE 0.9 % IJ SOLN: 3.0000 mL | INTRAMUSCULAR | Status: DC | PRN

## 2014-03-20 MED ORDER — SODIUM CHLORIDE 0.9 % IJ SOLN
INTRAMUSCULAR | Status: AC
  Filled 2014-03-20: qty 10

## 2014-03-20 MED ORDER — ONDANSETRON HCL 4 MG/2ML IJ SOLN
INTRAMUSCULAR | Status: AC
  Filled 2014-03-20: qty 2

## 2014-03-20 MED ORDER — SODIUM CHLORIDE 0.9 % IV SOLN
INTRAVENOUS | Status: DC
  Administered 2014-03-20: 11:00:00 via INTRAVENOUS

## 2014-03-20 MED ORDER — METOCLOPRAMIDE HCL 5 MG PO TABS
5.0000 mg | ORAL_TABLET | Freq: Three times a day (TID) | ORAL | Status: DC
  Administered 2014-03-21 – 2014-03-27 (×15): 5 mg via ORAL
  Filled 2014-03-20 (×22): qty 1

## 2014-03-20 MED ORDER — ASPIRIN EC 81 MG PO TBEC
81.0000 mg | DELAYED_RELEASE_TABLET | Freq: Two times a day (BID) | ORAL | Status: DC
  Administered 2014-03-21 – 2014-03-27 (×12): 81 mg via ORAL
  Filled 2014-03-20 (×15): qty 1

## 2014-03-20 MED ORDER — SEVELAMER CARBONATE 800 MG PO TABS
1600.0000 mg | ORAL_TABLET | Freq: Three times a day (TID) | ORAL | Status: DC
  Administered 2014-03-21 – 2014-03-27 (×5): 1600 mg via ORAL
  Filled 2014-03-20 (×22): qty 2

## 2014-03-20 MED ORDER — RENA-VITE PO TABS
1.0000 | ORAL_TABLET | Freq: Every day | ORAL | Status: DC
  Administered 2014-03-21 – 2014-03-26 (×6): 1 via ORAL
  Filled 2014-03-20 (×10): qty 1

## 2014-03-20 MED ORDER — PENTAFLUOROPROP-TETRAFLUOROETH EX AERO: 1.0000 "application " | INHALATION_SPRAY | CUTANEOUS | Status: DC | PRN

## 2014-03-20 MED ORDER — INSULIN ASPART 100 UNIT/ML ~~LOC~~ SOLN
3.0000 [IU] | Freq: Three times a day (TID) | SUBCUTANEOUS | Status: DC
Start: 2014-03-21 — End: 2014-03-27
  Administered 2014-03-22 – 2014-03-26 (×6): 3 [IU] via SUBCUTANEOUS

## 2014-03-20 MED ORDER — PANTOPRAZOLE SODIUM 40 MG PO TBEC: 40.0000 mg | DELAYED_RELEASE_TABLET | Freq: Every day | ORAL | Status: DC

## 2014-03-20 MED ORDER — PROPOFOL 10 MG/ML IV BOLUS
INTRAVENOUS | Status: AC
Start: 2014-03-20 — End: 2014-03-20
  Filled 2014-03-20: qty 20

## 2014-03-20 SURGICAL SUPPLY — 38 items
BLADE SAW RECIP 87.9 MT (BLADE) ×4 IMPLANT
BNDG COHESIVE 6X5 TAN STRL LF (GAUZE/BANDAGES/DRESSINGS) ×4 IMPLANT
COVER SURGICAL LIGHT HANDLE (MISCELLANEOUS) ×4 IMPLANT
CUFF TOURNIQUET SINGLE 34IN LL (TOURNIQUET CUFF) IMPLANT
CUFF TOURNIQUET SINGLE 44IN (TOURNIQUET CUFF) IMPLANT
DRAIN PENROSE 1/2X12 LTX STRL (WOUND CARE) IMPLANT
DRAPE EXTREMITY T 121X128X90 (DRAPE) ×4 IMPLANT
DRAPE PROXIMA HALF (DRAPES) ×4 IMPLANT
DRAPE U-SHAPE 47X51 STRL (DRAPES) ×8 IMPLANT
DRSG ADAPTIC 3X8 NADH LF (GAUZE/BANDAGES/DRESSINGS) ×4 IMPLANT
DURAPREP 26ML APPLICATOR (WOUND CARE) ×4 IMPLANT
ELECT CAUTERY BLADE 6.4 (BLADE) IMPLANT
ELECT REM PT RETURN 9FT ADLT (ELECTROSURGICAL) ×3
ELECTRODE REM PT RTRN 9FT ADLT (ELECTROSURGICAL) ×2 IMPLANT
EVACUATOR 1/8 PVC DRAIN (DRAIN) IMPLANT
GAUZE SPONGE 4X4 12PLY STRL (GAUZE/BANDAGES/DRESSINGS) ×4 IMPLANT
GLOVE BIOGEL PI IND STRL 9 (GLOVE) ×2 IMPLANT
GLOVE BIOGEL PI INDICATOR 9 (GLOVE) ×2
GLOVE SURG ORTHO 9.0 STRL STRW (GLOVE) ×4 IMPLANT
GOWN STRL REUS W/ TWL XL LVL3 (GOWN DISPOSABLE) ×4 IMPLANT
GOWN STRL REUS W/TWL XL LVL3 (GOWN DISPOSABLE) ×6
KIT BASIN OR (CUSTOM PROCEDURE TRAY) ×4 IMPLANT
KIT ROOM TURNOVER OR (KITS) ×4 IMPLANT
MANIFOLD NEPTUNE II (INSTRUMENTS) ×4 IMPLANT
NS IRRIG 1000ML POUR BTL (IV SOLUTION) ×4 IMPLANT
PACK GENERAL/GYN (CUSTOM PROCEDURE TRAY) ×4 IMPLANT
PAD ARMBOARD 7.5X6 YLW CONV (MISCELLANEOUS) ×4 IMPLANT
STAPLER VISISTAT 35W (STAPLE) IMPLANT
STOCKINETTE IMPERVIOUS LG (DRAPES) IMPLANT
SUT PDS AB 1 CT  36 (SUTURE)
SUT PDS AB 1 CT 36 (SUTURE) IMPLANT
SUT SILK 2 0 (SUTURE) ×3
SUT SILK 2-0 18XBRD TIE 12 (SUTURE) ×2 IMPLANT
SWAB COLLECTION DEVICE MRSA (MISCELLANEOUS) IMPLANT
TOWEL OR 17X24 6PK STRL BLUE (TOWEL DISPOSABLE) ×4 IMPLANT
TOWEL OR 17X26 10 PK STRL BLUE (TOWEL DISPOSABLE) ×4 IMPLANT
TUBE ANAEROBIC SPECIMEN COL (MISCELLANEOUS) IMPLANT
WATER STERILE IRR 1000ML POUR (IV SOLUTION) ×4 IMPLANT

## 2014-03-20 NOTE — H&P (Signed)
Bridget Honomu is an 58 y.o. female.   Chief Complaint: Gangrene right transtibial amputation HPI: Patient is a 58 year old woman with peripheral vascular disease diabetes status post limb salvage intervention with a transtibial amputation on the right. Patient has had progressive gangrenous necrotic changes to the residual limb.  Past Medical History  Diagnosis Date  . CAD (coronary artery disease)   . Hypertension   . Diabetes mellitus without complication     Type II  . CKD (chronic kidney disease) stage 4, GFR 15-29 ml/min   . Hyperlipidemia   . CHF (congestive heart failure)     Acute on chronic diastolic  . UTI (lower urinary tract infection)   . Thyroid disease     Abnormal Thyroid function Test  . Anemia   . DVT (deep venous thrombosis)   . Thrombocytopenia   . Gangrene of lower extremity     RLE    Past Surgical History  Procedure Laterality Date  . Coronary artery bypass graft    . Cholecystectomy    . Carotid endarterectomy      Bilateral  . Foot amputation Bilateral   . Amputation Right 02/01/2014    Procedure: Right Below Knee Amputation;  Surgeon: Newt Minion, MD;  Location: [facility];  Service: Orthopedics;  Laterality: Right;    Family History  Problem Relation Age of Onset  . Adopted: Yes  . Hypertension Father   . Heart disease Father     Coronary Artery Disease   Social History:  reports that she has quit smoking. Her smoking use included Cigarettes. She smoked 0.00 packs per day for 15 years. She has never used smokeless tobacco. She reports that she does not drink alcohol or use illicit drugs.  Allergies:  Allergies  Allergen Reactions  . Diphenhydramine Nausea And Vomiting  . Tramadol Hcl Nausea And Vomiting  . Atenolol Other (See Comments)    unknown  . Iodinated Diagnostic Agents Other (See Comments)    unknown  . Metoprolol Other (See Comments)    unknown  . Morphine And Related Other (See Comments)    unknown  . Thiopental Other (See  Comments)    Other     No prescriptions prior to admission    No results found for this or any previous visit (from the past 48 hour(s)). No results found.  Review of Systems  All other systems reviewed and are negative.   There were no vitals taken for this visit. Physical Exam  On examination patient's entire residual limb is cellulitic and gangrenous. Assessment/Plan Assessment: Gangrene right transtibial amputation.  Plan: We will plan for an above-the-knee amputation. Risks and benefits were discussed including need for higher level amputation. Patient states she understands wished to proceed at this time.  DUDA,MARCUS V 03/20/2014, 7:01 AM

## 2014-03-20 NOTE — Progress Notes (Signed)
Spoke with Dr Ninfa Linden about 3 IV RNs unable to start an IV.  Two  unit of PRBS are on hold for now.

## 2014-03-20 NOTE — Progress Notes (Signed)
Patient will undergo AKA on Sunday by Dr. Sharol Given.   Dialysis per usual schedule.  Azucena Cecil, MD [facility] [phone] 9:43 PM  '

## 2014-03-20 NOTE — Procedures (Signed)
Tolerating hemodialysis without any instability.  PRBCs are planned but delayed due to antibodies. Brandi Arellano

## 2014-03-20 NOTE — Anesthesia Preprocedure Evaluation (Signed)
Anesthesia Evaluation  Patient identified by MRN, date of birth, ID band Patient awake    Reviewed: Allergy & Precautions, H&P , NPO status   Airway Mallampati: II  TM Distance: >3 FB Neck ROM: Full    Dental   Pulmonary neg pulmonary ROS, former smoker,  breath sounds clear to auscultation        Cardiovascular hypertension, + CAD and +CHF Rhythm:Regular Rate:Normal     Neuro/Psych    GI/Hepatic negative GI ROS, Neg liver ROS,   Endo/Other  diabetes  Renal/GU Renal disease     Musculoskeletal   Abdominal   Peds  Hematology  (+) anemia ,   Anesthesia Other Findings   Reproductive/Obstetrics                             Anesthesia Physical Anesthesia Plan  ASA: III  Anesthesia Plan: General   Post-op Pain Management:    Induction: Intravenous  Airway Management Planned: LMA  Additional Equipment:   Intra-op Plan:   Post-operative Plan: Extubation in OR  Informed Consent: I have reviewed the patients History and Physical, chart, labs and discussed the procedure including the risks, benefits and alternatives for the proposed anesthesia with the patient or authorized representative who has indicated his/her understanding and acceptance.     Plan Discussed with: CRNA and Anesthesiologist  Anesthesia Plan Comments:         Anesthesia Quick Evaluation
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
## 2014-03-20 NOTE — Progress Notes (Signed)
Patient has no visible IV access point.  Spoke with Dr. Oletta Lamas, and may access the diatek.  Diatek is sutured into skin .   After putting on face mask, I removed cap from venous port, scrubbed the hub x 45 sec. And aspirated 10 cc blood and then removed another 6-7 cc for labs.  DA

## 2014-03-21 ENCOUNTER — Encounter (HOSPITAL_COMMUNITY): Payer: Self-pay | Admitting: Surgery

## 2014-03-21 LAB — CBC
HEMATOCRIT: 22.5 % — AB (ref 36.0–46.0)
HEMOGLOBIN: 7.1 g/dL — AB (ref 12.0–15.0)
MCH: 31.3 pg (ref 26.0–34.0)
MCHC: 31.6 g/dL (ref 30.0–36.0)
MCV: 99.1 fL (ref 78.0–100.0)
Platelets: 252 10*3/uL (ref 150–400)
RBC: 2.27 MIL/uL — AB (ref 3.87–5.11)
RDW: 17.4 % — ABNORMAL HIGH (ref 11.5–15.5)
WBC: 6 10*3/uL (ref 4.0–10.5)

## 2014-03-21 LAB — BASIC METABOLIC PANEL
ANION GAP: 13 (ref 5–15)
BUN: 21 mg/dL (ref 6–23)
CHLORIDE: 95 meq/L — AB (ref 96–112)
CO2: 24 meq/L (ref 19–32)
CREATININE: 3.44 mg/dL — AB (ref 0.50–1.10)
Calcium: 7.7 mg/dL — ABNORMAL LOW (ref 8.4–10.5)
GFR calc Af Amer: 16 mL/min — ABNORMAL LOW (ref >90)
GFR calc non Af Amer: 14 mL/min — ABNORMAL LOW (ref >90)
Glucose, Bld: 114 mg/dL — ABNORMAL HIGH (ref 70–99)
POTASSIUM: 3.8 meq/L (ref 3.7–5.3)
Sodium: 132 mEq/L — ABNORMAL LOW (ref 137–147)

## 2014-03-21 LAB — GLUCOSE, CAPILLARY
GLUCOSE-CAPILLARY: 208 mg/dL — AB (ref 70–99)
Glucose-Capillary: 112 mg/dL — ABNORMAL HIGH (ref 70–99)
Glucose-Capillary: 103 mg/dL — ABNORMAL HIGH (ref 70–99)
Glucose-Capillary: 93 mg/dL (ref 70–99)

## 2014-03-21 NOTE — Progress Notes (Signed)
Subjective:  Mild right BKA pain, afebrile, no nausea, but no appetite  Objective: Vital signs in last 24 hours: Temp:  [97.1 F (36.2 C)-98 F (36.7 C)] 98 F (36.7 C) (12/10 0500) Pulse Rate:  [57-79] 64 (12/10 0500) Resp:  [16-18] 16 (12/10 0500) BP: (92-172)/(51-77) 117/55 mmHg (12/10 0500) SpO2:  [98 %-100 %] 98 % (12/10 0500) Weight:  [55.3 kg (121 lb 14.6 oz)-56.1 kg (123 lb 10.9 oz)] 55.3 kg (121 lb 14.6 oz) (12/09 1900) Weight change:   Intake/Output from previous day: 12/09 0701 - 12/10 0700 In: 170 [P.O.:60; IV Piggyback:110] Out: 700  Intake/Output this shift:   Lab Results:  Recent Labs  03/20/14 1434 03/21/14 0507  WBC 11.9* 6.0  HGB 7.1* 7.1*  HCT 22.2* 22.5*  PLT 311 252   BMET:  Recent Labs  03/20/14 1032 03/20/14 1435 03/21/14 0507  NA 131* 129* 132*  K 4.5 4.5 3.8  CL 87* 86* 95*  CO2 25 27 24   GLUCOSE 167* 129* 114*  BUN 63* 66* 21  CREATININE 6.88* 6.87* 3.44*  CALCIUM 7.9* 7.9* 7.7*  ALBUMIN 2.5* 2.3*  --    No results for input(s): PTH in the last 72 hours. Iron Studies: No results for input(s): IRON, TIBC, TRANSFERRIN, FERRITIN in the last 72 hours.  Studies/Results: No results found. EXAM: General appearance:  Alert, in no apparent distress Resp:  CTA without rales, rhonchi, or wheezes Cardio:  RRR with Gr II/VI systolic murmur, no rub GI:  + BS, soft and nontender Extremities:  Well-healed L TMA, dressing over malodorous R BKA, no edema Access:  R IJ catheter  Dialysis Orders: MWF @ [facility] for rehab, previously @ [place] 4 hrs 53 kg 2K/2.25Ca 400/A1.5 Heparin 5400 U R IJ catheter Hectorol 1 mcg Aranesp 200 mcg & Venofer 100 mg on Wed  Assessment/Plan: 1. Gangrene of R BKA - AKA scheduled yesterday, postponed sec to low Hgb, now 12/13 per Dr. Sharol Given. 2. Anemia - Hgb down to 7.1 today, Aranesp 200 mcg on Wed, T-sat 13% on 11/18, s/p Venofer loading, now qwk; plans for transfusion yesterday with HD  delayed until next HD on 12/11. 3. ESRD - HD on MWF @ [place] (@ [place] before DC to [place]), K 4.5. Next HD tomorrow. 4. HTN/volume - BP 117/55, no meds; wt 55.3 kg, no excess fluid. 5. Metabolic bone disease - Ca 7.9 (9.3 corrected), P 5.3, iPTH 19; Hectorol 1 mcg, Sensipar 30 mg qd, Renvela 2 with meals. Hold Sensipar sec to low iPTH. 6. Nutrition - Alb 2.3, carb-mod renal diet, vitamin. 7. PVD - L transmet amp 05/2007, R BKA 02/01/14. 8. DM Type 2 - per primary.    LOS: 1 day   LYLES,CHARLES 03/21/2014,8:21 AM  Renal Attending: As above, for PRBCs when available and surgery delayed. HD in Am with transfusion then. Banita Lehn C

## 2014-03-21 NOTE — Progress Notes (Signed)
Patient ID: Brandi Arellano, female   DOB: 19-Aug-1955, 58 y.o.   MRN: [id number] Patient did not receive her 2 units of packed red blood cells with dialysis yesterday. Patient will need her transfusion of 2 units packed red blood cells on Friday with dialysis in order for her to proceed with surgery. Plan for surgery on Sunday morning. Hemoglobin unchanged at 7.1.

## 2014-03-21 NOTE — Progress Notes (Signed)
Pt. Has had no food intake today, I have offered her food numerous times but she is refusing, saying "I don't eat that stuff." I offered her boxed meals that we carry on the floor ([place] sandwiches) and she continues to refuse. Asked her what she eats at home so we can get her something to eat and she says much of nothing.    Penni Bombard, RN 03/21/2014 7:01 PM

## 2014-03-21 NOTE — Plan of Care (Signed)
Problem: Phase I Progression Outcomes Goal: Estimated dry weight within 2kg Outcome: Completed/Met Date Met:  03/21/14

## 2014-03-22 LAB — GLUCOSE, CAPILLARY
Glucose-Capillary: 73 mg/dL (ref 70–99)
Glucose-Capillary: 165 mg/dL — ABNORMAL HIGH (ref 70–99)

## 2014-03-22 LAB — CBC
HCT: 22.9 % — ABNORMAL LOW (ref 36.0–46.0)
HEMOGLOBIN: 7.1 g/dL — AB (ref 12.0–15.0)
MCH: 30.3 pg (ref 26.0–34.0)
MCHC: 31 g/dL (ref 30.0–36.0)
MCV: 97.9 fL (ref 78.0–100.0)
Platelets: 317 10*3/uL (ref 150–400)
RBC: 2.34 MIL/uL — ABNORMAL LOW (ref 3.87–5.11)
RDW: 17.3 % — ABNORMAL HIGH (ref 11.5–15.5)
WBC: 12.9 10*3/uL — AB (ref 4.0–10.5)

## 2014-03-22 LAB — RENAL FUNCTION PANEL
Albumin: 2.2 g/dL — ABNORMAL LOW (ref 3.5–5.2)
Anion gap: 14 (ref 5–15)
BUN: 35 mg/dL — ABNORMAL HIGH (ref 6–23)
CO2: 26 meq/L (ref 19–32)
CREATININE: 4.93 mg/dL — AB (ref 0.50–1.10)
Calcium: 7.6 mg/dL — ABNORMAL LOW (ref 8.4–10.5)
Chloride: 96 mEq/L (ref 96–112)
GFR calc Af Amer: 10 mL/min — ABNORMAL LOW (ref >90)
GFR, EST NON AFRICAN AMERICAN: 9 mL/min — AB (ref >90)
Glucose, Bld: 44 mg/dL — CL (ref 70–99)
PHOSPHORUS: 3.2 mg/dL (ref 2.3–4.6)
Potassium: 3.9 mEq/L (ref 3.7–5.3)
Sodium: 136 mEq/L — ABNORMAL LOW (ref 137–147)

## 2014-03-22 MED ORDER — PENTAFLUOROPROP-TETRAFLUOROETH EX AERO: 1.0000 "application " | INHALATION_SPRAY | CUTANEOUS | Status: DC | PRN

## 2014-03-22 MED ORDER — HEPARIN SODIUM (PORCINE) 1000 UNIT/ML DIALYSIS: 1000.0000 [IU] | INTRAMUSCULAR | Status: DC | PRN

## 2014-03-22 MED ORDER — ALTEPLASE 2 MG IJ SOLR
2.0000 mg | Freq: Once | INTRAMUSCULAR | Status: DC | PRN
Start: 2014-03-22 — End: 2014-03-22
  Filled 2014-03-22: qty 2

## 2014-03-22 MED ORDER — NEPRO/CARBSTEADY PO LIQD: 237.0000 mL | ORAL | Status: DC | PRN

## 2014-03-22 MED ORDER — SODIUM CHLORIDE 0.9 % IV SOLN: 100.0000 mL | INTRAVENOUS | Status: DC | PRN

## 2014-03-22 MED ORDER — LIDOCAINE HCL (PF) 1 % IJ SOLN: 5.0000 mL | INTRAMUSCULAR | Status: DC | PRN

## 2014-03-22 MED ORDER — OXYCODONE HCL 5 MG PO TABS
ORAL_TABLET | ORAL | Status: AC
  Administered 2014-03-22: 5 mg
  Filled 2014-03-22: qty 1

## 2014-03-22 MED ORDER — LIDOCAINE-PRILOCAINE 2.5-2.5 % EX CREA: 1.0000 "application " | TOPICAL_CREAM | CUTANEOUS | Status: DC | PRN

## 2014-03-22 NOTE — Progress Notes (Signed)
Rechecked glucose reading is now 88.Marland KitchenMarland KitchenMarland Kitchen

## 2014-03-22 NOTE — Progress Notes (Signed)
CRITICAL VALUE ALERT  Critical value received:  Glucose 44  Date of notification:  03/22/14  Time of notification:  0800  Critical value read back: yes   Nurse who received alert:  Wallace Cullens  MD notified (1st page):    Time of first page:    MD notified (2nd page):  Time of second page:  Responding MD:    Time MD responded:

## 2014-03-22 NOTE — Procedures (Signed)
Tolerating hemodialysis.  Received two units PRBCs during treatment in preparation for anesthesia and surgery on Sunday.  Hg stable at 7.1gm today.   Anwyn Kriegel C

## 2014-03-22 NOTE — Progress Notes (Signed)
CARE MANAGEMENT NOTE 03/22/2014  Patient:  Brandi Arellano, Brandi Arellano   Account Number:  [id number]  Date Initiated:  03/22/2014  Documentation initiated by:  [facility]  Subjective/Objective Assessment:   R AKA planned on 03/24/2014     Action/Plan:   brother has POA (if pt cannot make her own decisions), Janith Lima [phone]   Anticipated DC Date:     Anticipated DC Plan:  [place] referral  Clinical Social Worker      DC Planning Services  CM consult      Choice offered to / List presented to:             Status of service:  Completed, signed off Medicare Important Message given?   (If response is "NO", the following Medicare IM given date fields will be blank) Date Medicare IM given:   Medicare IM given by:   Date Additional Medicare IM given:   Additional Medicare IM given by:    Discharge Disposition:  [place]  Per UR Regulation:    If discussed at Long Length of Stay Meetings, dates discussed:    Comments:  03/22/2014 1220 Pt's brother called and states he is POA if pt is unable to make decisions. He prefers dc to SNF in [place] if available or back to [employer] if no available here in [place]. Waiting final recommendations. CSW aware of plan dc back to SNF. Jonnie Finner RN CCM Case Mgmt phone [phone]

## 2014-03-22 NOTE — Progress Notes (Deleted)
Rechecked glucose reading is now 88.Marland KitchenMarland KitchenMarland Kitchen

## 2014-03-22 NOTE — Progress Notes (Signed)
CRITICAL VALUE ALERT  Critical value received:  0800  Date of notification:  03/22/14  Time of notification:  0800  Critical value read back: yes   Nurse who received alert:  Wallace Cullens   MD notified (1st page):    Time of first page:    MD notified (2nd page):  Time of second page:  Responding MD:    Time MD responded:

## 2014-03-23 LAB — TYPE AND SCREEN
ABO/RH(D): O POS
Antibody Screen: POSITIVE
DAT, IgG: POSITIVE
UNIT DIVISION: 0
Unit division: 0

## 2014-03-23 LAB — BASIC METABOLIC PANEL
Anion gap: 13 (ref 5–15)
BUN: 15 mg/dL (ref 6–23)
CO2: 24 meq/L (ref 19–32)
Calcium: 7.6 mg/dL — ABNORMAL LOW (ref 8.4–10.5)
Chloride: 100 mEq/L (ref 96–112)
Creatinine, Ser: 3.15 mg/dL — ABNORMAL HIGH (ref 0.50–1.10)
GFR calc Af Amer: 18 mL/min — ABNORMAL LOW (ref >90)
GFR calc non Af Amer: 15 mL/min — ABNORMAL LOW (ref >90)
GLUCOSE: 46 mg/dL — AB (ref 70–99)
POTASSIUM: 4.5 meq/L (ref 3.7–5.3)
Sodium: 137 mEq/L (ref 137–147)

## 2014-03-23 LAB — GLUCOSE, CAPILLARY
GLUCOSE-CAPILLARY: 47 mg/dL — AB (ref 70–99)
GLUCOSE-CAPILLARY: 140 mg/dL — AB (ref 70–99)
Glucose-Capillary: 88 mg/dL (ref 70–99)
Glucose-Capillary: 122 mg/dL — ABNORMAL HIGH (ref 70–99)
Glucose-Capillary: 111 mg/dL — ABNORMAL HIGH (ref 70–99)
Glucose-Capillary: 118 mg/dL — ABNORMAL HIGH (ref 70–99)
Glucose-Capillary: 164 mg/dL — ABNORMAL HIGH (ref 70–99)

## 2014-03-23 LAB — CBC
HCT: 28.5 % — ABNORMAL LOW (ref 36.0–46.0)
Hemoglobin: 8.9 g/dL — ABNORMAL LOW (ref 12.0–15.0)
MCH: 29.8 pg (ref 26.0–34.0)
MCHC: 31.2 g/dL (ref 30.0–36.0)
MCV: 95.3 fL (ref 78.0–100.0)
Platelets: 213 10*3/uL (ref 150–400)
RBC: 2.99 MIL/uL — AB (ref 3.87–5.11)
RDW: 17.7 % — ABNORMAL HIGH (ref 11.5–15.5)
WBC: 8.3 10*3/uL (ref 4.0–10.5)

## 2014-03-23 MED ORDER — CHLORHEXIDINE GLUCONATE 4 % EX LIQD
60.0000 mL | Freq: Once | CUTANEOUS | Status: AC
  Administered 2014-03-24: 4 via TOPICAL
  Filled 2014-03-23: qty 60

## 2014-03-23 MED ORDER — OXYCODONE HCL 5 MG PO TABS
10.0000 mg | ORAL_TABLET | ORAL | Status: DC | PRN
  Administered 2014-03-23 – 2014-03-27 (×4): 10 mg via ORAL
  Filled 2014-03-23 (×4): qty 2

## 2014-03-23 MED ORDER — CEFAZOLIN SODIUM-DEXTROSE 2-3 GM-% IV SOLR
2.0000 g | INTRAVENOUS | Status: AC
  Administered 2014-03-24: 2 g via INTRAVENOUS
  Filled 2014-03-23: qty 50

## 2014-03-23 NOTE — Progress Notes (Signed)
Subjective:   Mild pain, eating well. No complaints  Objective Filed Vitals:   03/22/14 1708 03/22/14 2133 03/23/14 0429 03/23/14 0932  BP: 98/49 98/46 129/53 135/68  Pulse: 75 72 78 73  Temp: 97.9 F (36.6 C) 98.3 F (36.8 C) 99 F (37.2 C) 97.8 F (36.6 C)  TempSrc: Oral Oral Oral Oral  Resp: 16 15 16 18   Height:      Weight:      SpO2: 97% 97% 94% 96%   Physical Exam General: alert and oriented. No acute distress Heart: RRR. 2/6 systolic murmur  Lungs: CTA, unlabored  Abdomen: soft, nontender +BS  Extremities: no edema. R BKA with dressing intact. Healed L TMA Dialysis Access: R IK   Dialysis Orders: MWF @ [facility] for rehab, previously @ [place] 4 hrs 53 kg 2K/2.25Ca 400/A1.5 Heparin 5400 U R IJ catheter Hectorol 1 mcg Aranesp 200 mcg & Venofer 100 mg on Wed  Assessment/Plan: 1. Gangrene of R BKA - AKA, postponed sec to low Hgb, now 12/13 per Dr. Sharol Given. 2. Anemia - Hgb 8.9 , Aranesp 200 mcg on Wed, T-sat 13% on 11/18, s/p Venofer loading, now qwk; 2u RBC transfused 12/11 3. ESRD - HD on MWF @ [place] (@ [place] before DC to [place]), K 4.5. Next HD monday 4. HTN/volume - BP 135/68, no meds, no excess fluid. 5. Metabolic bone disease - Ca 7.6 (9 corrected), P 5.3, iPTH 19; Hectorol 1 mcg, Sensipar 30 mg qd, Renvela 2 with meals. Hold Sensipar sec to low iPTH. 6. Nutrition - Alb 2.3, carb-mod renal diet, vitamin. 7. PVD - L transmet amp 05/2007, R BKA 02/01/14. 8. DM Type 2 - per primary.hypoglycemia this AM- BG [place], NP [place] [phone] 03/23/2014,9:34 AM  LOS: 3 days    Renal Attending: Stable post transfusion.  Tolerated HD yesterday.  For OR Sun .Kyren Knick C    Additional Objective Labs: Basic Metabolic Panel:  Recent Labs Lab 03/20/14 1435 03/21/14 0507 03/22/14 0700 03/23/14 0416  NA 129* 132* 136* 137  K 4.5 3.8 3.9 4.5  CL 86* 95* 96 100  CO2 27 24 26 24    GLUCOSE 129* 114* 44* 46*  BUN 66* 21 35* 15  CREATININE 6.87* 3.44* 4.93* 3.15*  CALCIUM 7.9* 7.7* 7.6* 7.6*  PHOS 5.3*  --  3.2  --    Liver Function Tests:  Recent Labs Lab 03/20/14 1032 03/20/14 1435 03/22/14 0700  AST 10  --   --   ALT 14  --   --   ALKPHOS 143*  --   --   BILITOT 0.2*  --   --   PROT 7.2  --   --   ALBUMIN 2.5* 2.3* 2.2*   No results for input(s): LIPASE, AMYLASE in the last 168 hours. CBC:  Recent Labs Lab 03/20/14 1032 03/20/14 1434 03/21/14 0507 03/22/14 0700 03/23/14 0416  WBC 13.7* 11.9* 6.0 12.9* 8.3  HGB 7.4* 7.1* 7.1* 7.1* 8.9*  HCT 22.6* 22.2* 22.5* 22.9* 28.5*  MCV 96.6 96.9 99.1 97.9 95.3  PLT 303 311 252 317 213   Blood Culture    Component Value Date/Time   SDES BLOOD RIGHT HAND 01/27/2014 1722   SPECREQUEST BOTTLES DRAWN AEROBIC AND ANAEROBIC 5CC EA 01/27/2014 1722   CULT  01/27/2014 1722    NO GROWTH 5 DAYS Performed at [facility] 02/02/2014 FINAL 01/27/2014 1722    Cardiac Enzymes: No results for  input(s): CKTOTAL, CKMB, CKMBINDEX, TROPONINI in the last 168 hours. CBG:  Recent Labs Lab 03/22/14 1149 03/22/14 1707 03/22/14 2130 03/23/14 0758 03/23/14 0832  GLUCAP 73 165* 122* 47* 111*   Iron Studies: No results for input(s): IRON, TIBC, TRANSFERRIN, FERRITIN in the last 72 hours. @lablastinr3 @ Studies/Results: No results found. Medications:   . sodium chloride   Intravenous Once  . aspirin EC  81 mg Oral BID  . cinacalcet  30 mg Oral Q breakfast  . darbepoetin (ARANESP) injection - DIALYSIS  200 mcg Intravenous Q Wed-HD  . feeding supplement (RESOURCE BREEZE)  1 Container Oral BID BM  . ferric gluconate (FERRLECIT/NULECIT) IV  125 mg Intravenous Weekly  . insulin aspart  0-9 Units Subcutaneous TID WC  . insulin aspart  3 Units Subcutaneous TID WC  . insulin detemir  9 Units Subcutaneous QHS  . levothyroxine  25 mcg Oral QAC breakfast  . metoCLOPramide  5 mg Oral TID AC  .  multivitamin  1 tablet Oral QHS  . pantoprazole  40 mg Oral Daily  . sevelamer carbonate  1,600 mg Oral TID WC  . sodium chloride  3 mL Intravenous Q12H

## 2014-03-23 NOTE — Progress Notes (Signed)
Pt stable Pain meds adjusted For right aka in am

## 2014-03-24 ENCOUNTER — Inpatient Hospital Stay (HOSPITAL_COMMUNITY): Payer: Medicaid Other | Admitting: Certified Registered"

## 2014-03-24 ENCOUNTER — Encounter (HOSPITAL_COMMUNITY)
Admission: RE | Disposition: A | Discharge status: Skilled Nursing Facility | Payer: Self-pay | Source: Ambulatory Visit | Attending: Orthopedic Surgery

## 2014-03-24 HISTORY — PX: AMPUTATION: SHX166

## 2014-03-24 LAB — GLUCOSE, CAPILLARY
GLUCOSE-CAPILLARY: 61 mg/dL — AB (ref 70–99)
GLUCOSE-CAPILLARY: 117 mg/dL — AB (ref 70–99)
GLUCOSE-CAPILLARY: 105 mg/dL — AB (ref 70–99)
GLUCOSE-CAPILLARY: 262 mg/dL — AB (ref 70–99)
GLUCOSE-CAPILLARY: 230 mg/dL — AB (ref 70–99)
Glucose-Capillary: 53 mg/dL — ABNORMAL LOW (ref 70–99)
Glucose-Capillary: 62 mg/dL — ABNORMAL LOW (ref 70–99)

## 2014-03-24 LAB — CBC
HEMATOCRIT: 30.9 % — AB (ref 36.0–46.0)
Hemoglobin: 9.9 g/dL — ABNORMAL LOW (ref 12.0–15.0)
MCH: 31.2 pg (ref 26.0–34.0)
MCHC: 32 g/dL (ref 30.0–36.0)
MCV: 97.5 fL (ref 78.0–100.0)
Platelets: 225 10*3/uL (ref 150–400)
RBC: 3.17 MIL/uL — ABNORMAL LOW (ref 3.87–5.11)
RDW: 17.9 % — AB (ref 11.5–15.5)
WBC: 7.9 10*3/uL (ref 4.0–10.5)

## 2014-03-24 LAB — BASIC METABOLIC PANEL
Anion gap: 17 — ABNORMAL HIGH (ref 5–15)
BUN: 25 mg/dL — ABNORMAL HIGH (ref 6–23)
CALCIUM: 7.9 mg/dL — AB (ref 8.4–10.5)
CO2: 21 meq/L (ref 19–32)
CREATININE: 4.55 mg/dL — AB (ref 0.50–1.10)
Chloride: 95 mEq/L — ABNORMAL LOW (ref 96–112)
GFR calc Af Amer: 11 mL/min — ABNORMAL LOW (ref >90)
GFR calc non Af Amer: 10 mL/min — ABNORMAL LOW (ref >90)
GLUCOSE: 121 mg/dL — AB (ref 70–99)
Potassium: 5.1 mEq/L (ref 3.7–5.3)
Sodium: 133 mEq/L — ABNORMAL LOW (ref 137–147)

## 2014-03-24 LAB — SURGICAL PCR SCREEN
MRSA, PCR: NEGATIVE
STAPHYLOCOCCUS AUREUS: NEGATIVE

## 2014-03-24 SURGERY — AMPUTATION, ABOVE KNEE
Anesthesia: General | Site: Leg Lower | Laterality: Right

## 2014-03-24 MED ORDER — ONDANSETRON HCL 4 MG/2ML IJ SOLN
INTRAMUSCULAR | Status: DC | PRN
  Administered 2014-03-24: 4 mg via INTRAVENOUS

## 2014-03-24 MED ORDER — DEXTROSE 5 % IV SOLN: 500.0000 mg | Freq: Four times a day (QID) | INTRAVENOUS | Status: DC | PRN

## 2014-03-24 MED ORDER — FENTANYL CITRATE 0.05 MG/ML IJ SOLN
INTRAMUSCULAR | Status: AC
  Filled 2014-03-24: qty 5

## 2014-03-24 MED ORDER — OXYCODONE HCL 5 MG/5ML PO SOLN: 5.0000 mg | Freq: Once | ORAL | Status: DC | PRN

## 2014-03-24 MED ORDER — OXYCODONE-ACETAMINOPHEN 5-325 MG PO TABS
1.0000 | ORAL_TABLET | ORAL | Status: DC | PRN
  Filled 2014-03-24: qty 2

## 2014-03-24 MED ORDER — DOCUSATE SODIUM 100 MG PO CAPS
100.0000 mg | ORAL_CAPSULE | Freq: Two times a day (BID) | ORAL | Status: DC
  Administered 2014-03-24 – 2014-03-27 (×5): 100 mg via ORAL
  Filled 2014-03-24 (×8): qty 1

## 2014-03-24 MED ORDER — LIDOCAINE HCL (CARDIAC) 20 MG/ML IV SOLN
INTRAVENOUS | Status: AC
  Filled 2014-03-24: qty 5

## 2014-03-24 MED ORDER — DEXAMETHASONE SODIUM PHOSPHATE 4 MG/ML IJ SOLN
INTRAMUSCULAR | Status: DC | PRN
  Administered 2014-03-24: 4 mg via INTRAVENOUS

## 2014-03-24 MED ORDER — METOCLOPRAMIDE HCL 5 MG PO TABS
5.0000 mg | ORAL_TABLET | Freq: Three times a day (TID) | ORAL | Status: DC | PRN
  Administered 2014-03-24: 5 mg via ORAL
  Filled 2014-03-24: qty 2

## 2014-03-24 MED ORDER — DEXTROSE 50 % IV SOLN
INTRAVENOUS | Status: AC
  Filled 2014-03-24: qty 50

## 2014-03-24 MED ORDER — HYDROMORPHONE HCL 1 MG/ML IJ SOLN: 0.2500 mg | INTRAMUSCULAR | Status: DC | PRN

## 2014-03-24 MED ORDER — FENTANYL CITRATE 0.05 MG/ML IJ SOLN
INTRAMUSCULAR | Status: AC
  Filled 2014-03-24: qty 2

## 2014-03-24 MED ORDER — ROCURONIUM BROMIDE 50 MG/5ML IV SOLN
INTRAVENOUS | Status: AC
  Filled 2014-03-24: qty 1

## 2014-03-24 MED ORDER — HYDROMORPHONE HCL 1 MG/ML IJ SOLN
0.5000 mg | INTRAMUSCULAR | Status: DC | PRN
  Administered 2014-03-26 – 2014-03-27 (×4): 1 mg via INTRAVENOUS
  Filled 2014-03-24 (×3): qty 1

## 2014-03-24 MED ORDER — DEXTROSE 50 % IV SOLN
INTRAVENOUS | Status: AC
  Administered 2014-03-24: 25 mL
  Filled 2014-03-24: qty 50

## 2014-03-24 MED ORDER — METHOCARBAMOL 500 MG PO TABS
500.0000 mg | ORAL_TABLET | Freq: Four times a day (QID) | ORAL | Status: DC | PRN
  Administered 2014-03-27: 500 mg via ORAL
  Filled 2014-03-24: qty 1

## 2014-03-24 MED ORDER — ONDANSETRON HCL 4 MG PO TABS
4.0000 mg | ORAL_TABLET | Freq: Four times a day (QID) | ORAL | Status: DC | PRN
  Administered 2014-03-27: 4 mg via ORAL
  Filled 2014-03-24: qty 1

## 2014-03-24 MED ORDER — FENTANYL CITRATE 0.05 MG/ML IJ SOLN
INTRAMUSCULAR | Status: DC | PRN
  Administered 2014-03-24: 100 ug via INTRAVENOUS

## 2014-03-24 MED ORDER — CEFAZOLIN SODIUM 1-5 GM-% IV SOLN
1.0000 g | Freq: Four times a day (QID) | INTRAVENOUS | Status: AC
  Administered 2014-03-24 – 2014-03-25 (×2): 1 g via INTRAVENOUS
  Filled 2014-03-24 (×3): qty 50

## 2014-03-24 MED ORDER — METOCLOPRAMIDE HCL 5 MG/ML IJ SOLN
5.0000 mg | Freq: Three times a day (TID) | INTRAMUSCULAR | Status: DC | PRN
  Filled 2014-03-24: qty 2

## 2014-03-24 MED ORDER — LACTATED RINGERS IV SOLN: INTRAVENOUS | Status: DC | PRN

## 2014-03-24 MED ORDER — OXYCODONE HCL 5 MG PO TABS: 5.0000 mg | ORAL_TABLET | Freq: Once | ORAL | Status: DC | PRN

## 2014-03-24 MED ORDER — ONDANSETRON HCL 4 MG/2ML IJ SOLN
4.0000 mg | Freq: Four times a day (QID) | INTRAMUSCULAR | Status: DC | PRN
  Administered 2014-03-26: 4 mg via INTRAVENOUS
  Filled 2014-03-24: qty 2

## 2014-03-24 MED ORDER — PROPOFOL 10 MG/ML IV BOLUS
INTRAVENOUS | Status: AC
  Filled 2014-03-24: qty 20

## 2014-03-24 MED ORDER — PROPOFOL 10 MG/ML IV BOLUS
INTRAVENOUS | Status: DC | PRN
  Administered 2014-03-24: 100 mg via INTRAVENOUS

## 2014-03-24 MED ORDER — GLUCOSE 40 % PO GEL
ORAL | Status: AC
  Filled 2014-03-24: qty 1

## 2014-03-24 MED ORDER — PHENYLEPHRINE HCL 10 MG/ML IJ SOLN
INTRAMUSCULAR | Status: DC | PRN
  Administered 2014-03-24: 80 ug via INTRAVENOUS
  Administered 2014-03-24: 40 ug via INTRAVENOUS
  Administered 2014-03-24 (×2): 80 ug via INTRAVENOUS

## 2014-03-24 MED ORDER — SODIUM CHLORIDE 0.9 % IV SOLN
INTRAVENOUS | Status: DC
  Administered 2014-03-24 – 2014-03-26 (×2): via INTRAVENOUS

## 2014-03-24 SURGICAL SUPPLY — 42 items
BLADE SAW RECIP 87.9 MT (BLADE) ×3 IMPLANT
BLADE SURG 21 STRL SS (BLADE) ×2 IMPLANT
BNDG COHESIVE 6X5 TAN STRL LF (GAUZE/BANDAGES/DRESSINGS) ×5 IMPLANT
BNDG GAUZE ELAST 4 BULKY (GAUZE/BANDAGES/DRESSINGS) ×2 IMPLANT
COVER SURGICAL LIGHT HANDLE (MISCELLANEOUS) ×3 IMPLANT
CUFF TOURNIQUET SINGLE 34IN LL (TOURNIQUET CUFF) IMPLANT
CUFF TOURNIQUET SINGLE 44IN (TOURNIQUET CUFF) IMPLANT
DRAIN PENROSE 1/2X12 LTX STRL (WOUND CARE) IMPLANT
DRAPE EXTREMITY T 121X128X90 (DRAPE) ×3 IMPLANT
DRAPE PROXIMA HALF (DRAPES) ×3 IMPLANT
DRAPE U-SHAPE 47X51 STRL (DRAPES) ×6 IMPLANT
DRSG ADAPTIC 3X8 NADH LF (GAUZE/BANDAGES/DRESSINGS) ×5 IMPLANT
DRSG PAD ABDOMINAL 8X10 ST (GAUZE/BANDAGES/DRESSINGS) ×2 IMPLANT
DURAPREP 26ML APPLICATOR (WOUND CARE) ×3 IMPLANT
ELECT CAUTERY BLADE 6.4 (BLADE) IMPLANT
ELECT REM PT RETURN 9FT ADLT (ELECTROSURGICAL) ×3
ELECTRODE REM PT RTRN 9FT ADLT (ELECTROSURGICAL) ×1 IMPLANT
EVACUATOR 1/8 PVC DRAIN (DRAIN) IMPLANT
GAUZE SPONGE 4X4 12PLY STRL (GAUZE/BANDAGES/DRESSINGS) ×3 IMPLANT
GLOVE BIOGEL PI IND STRL 9 (GLOVE) ×1 IMPLANT
GLOVE BIOGEL PI INDICATOR 9 (GLOVE) ×2
GLOVE SURG ORTHO 9.0 STRL STRW (GLOVE) ×3 IMPLANT
GOWN STRL REUS W/ TWL XL LVL3 (GOWN DISPOSABLE) ×2 IMPLANT
GOWN STRL REUS W/TWL XL LVL3 (GOWN DISPOSABLE) ×6
KIT BASIN OR (CUSTOM PROCEDURE TRAY) ×3 IMPLANT
KIT ROOM TURNOVER OR (KITS) ×3 IMPLANT
MANIFOLD NEPTUNE II (INSTRUMENTS) ×3 IMPLANT
NS IRRIG 1000ML POUR BTL (IV SOLUTION) ×3 IMPLANT
PACK GENERAL/GYN (CUSTOM PROCEDURE TRAY) ×3 IMPLANT
PAD ARMBOARD 7.5X6 YLW CONV (MISCELLANEOUS) ×3 IMPLANT
STAPLER VISISTAT 35W (STAPLE) IMPLANT
STOCKINETTE IMPERVIOUS LG (DRAPES) IMPLANT
SUT ETHILON 2 0 PSLX (SUTURE) ×4 IMPLANT
SUT PDS AB 1 CT  36 (SUTURE) ×2
SUT PDS AB 1 CT 36 (SUTURE) IMPLANT
SUT SILK 2 0 (SUTURE) ×3
SUT SILK 2-0 18XBRD TIE 12 (SUTURE) ×1 IMPLANT
SWAB COLLECTION DEVICE MRSA (MISCELLANEOUS) IMPLANT
TOWEL OR 17X24 6PK STRL BLUE (TOWEL DISPOSABLE) ×3 IMPLANT
TOWEL OR 17X26 10 PK STRL BLUE (TOWEL DISPOSABLE) ×3 IMPLANT
TUBE ANAEROBIC SPECIMEN COL (MISCELLANEOUS) IMPLANT
WATER STERILE IRR 1000ML POUR (IV SOLUTION) ×3 IMPLANT

## 2014-03-24 NOTE — Transfer of Care (Signed)
Immediate Anesthesia Transfer of Care Note  Patient: Brandi Arellano  Procedure(s) Performed: Procedure(s): AMPUTATION ABOVE KNEE (Right)  Patient Location: PACU  Anesthesia Type:General  Level of Consciousness: awake, alert , oriented and patient cooperative  Airway & Oxygen Therapy: Patient Spontanous Breathing and Patient connected to nasal cannula oxygen  Post-op Assessment: Report given to PACU RN, Post -op Vital signs reviewed and stable and Patient moving all extremities  Post vital signs: Reviewed and stable  Complications: No apparent anesthesia complications

## 2014-03-24 NOTE — Anesthesia Postprocedure Evaluation (Signed)
  Anesthesia Post-op Note  Patient: Brandi Arellano  Procedure(s) Performed: Procedure(s): AMPUTATION ABOVE KNEE (Right)  Patient Location: PACU  Anesthesia Type:General  Level of Consciousness: awake and alert   Airway and Oxygen Therapy: Patient Spontanous Breathing  Post-op Pain: mild  Post-op Assessment: Post-op Vital signs reviewed, Patient's Cardiovascular Status Stable and Respiratory Function Stable  Post-op Vital Signs: Reviewed  Filed Vitals:   03/24/14 1245  BP: 152/67  Pulse: 87  Temp: 36.7 C  Resp: 20    Complications: No apparent anesthesia complications

## 2014-03-24 NOTE — Op Note (Signed)
03/20/2014 - 03/24/2014  11:43 AM  PATIENT:  Brandi Arellano    PRE-OPERATIVE DIAGNOSIS:  gangrene right bka  POST-OPERATIVE DIAGNOSIS:  Same  PROCEDURE:  AMPUTATION ABOVE KNEE  SURGEON:  Newt Minion, MD  PHYSICIAN ASSISTANT:None ANESTHESIA:   General  PREOPERATIVE INDICATIONS:  Brandi Arellano is a  58 y.o. female with a diagnosis of gangrene right bka who failed conservative measures and elected for surgical management.    The risks benefits and alternatives were discussed with the patient preoperatively including but not limited to the risks of infection, bleeding, nerve injury, cardiopulmonary complications, the need for revision surgery, among others, and the patient was willing to proceed.  OPERATIVE IMPLANTS: none  OPERATIVE FINDINGS: Minimal petechial bleeding  OPERATIVE PROCEDURE: Patient is a 58 year old woman diabetic end-stage renal disease on dialysis who has complete gangrenous changes of the entire right transtibial amputation. Patient presents at this time for above-the-knee amputation. Risks and benefits were discussed including potential need to convert this  to a hip disarticulation. Patient states she understands was to proceed at this time.  Patient was brought to the operating room and underwent a general anesthetic. After adequate levels of anesthesia were obtained patient's right lower extremity was prepped using DuraPrep draped into a sterile field the necrotic transtibial amputation was draped out of the sterile field with Ioban dressing. A timeout was called. A fishmouth incision was made just distal to the knee joint. The distal aspect of the knee was resected with an oscillating saw. This was resected back to her deep retained hardware which is been present for over 30 years. The locking screw requires a standard screwdriver. The vascular bundle was suture ligated with 2-0 silk. The sciatic nerve was pulled cut and allowed to retract. Patient had minimal  petechial bleeding. The incision was closed using 2-0 nylon. There was no tension on the skin. A sterile compressive dressing was applied. Patient was extubated taken to the PACU in stable condition.

## 2014-03-24 NOTE — H&P (View-Only) (Signed)
Patient ID: Brandi Arellano, female   DOB: [birth year], 58 y.o.   MRN: [id number] Patient did not receive her 2 units of packed red blood cells with dialysis yesterday. Patient will need her transfusion of 2 units packed red blood cells on Friday with dialysis in order for her to proceed with surgery. Plan for surgery on Sunday morning. Hemoglobin unchanged at 7.1.

## 2014-03-24 NOTE — Interval H&P Note (Signed)
History and Physical Interval Note:  03/24/2014 10:30 AM  Brandi Arellano  has presented today for surgery, with the diagnosis of gangrene right bka  The various methods of treatment have been discussed with the patient and family. After consideration of risks, benefits and other options for treatment, the patient has consented to  Procedure(s): AMPUTATION ABOVE KNEE (Right) as a surgical intervention .  The patient's history has been reviewed, patient examined, no change in status, stable for surgery.  I have reviewed the patient's chart and labs.  Questions were answered to the patient's satisfaction.     DUDA,MARCUS V

## 2014-03-24 NOTE — Anesthesia Preprocedure Evaluation (Addendum)
Anesthesia Evaluation  Patient identified by MRN, date of birth, ID band Patient awake    Reviewed: Allergy & Precautions, H&P , NPO status , Patient's Chart, lab work & pertinent test results  Airway Mallampati: II  TM Distance: >3 FB Neck ROM: Full    Dental no notable dental hx. (+) Edentulous Upper, Partial Lower, Dental Advisory Given   Pulmonary neg pulmonary ROS, former smoker,  breath sounds clear to auscultation  Pulmonary exam normal       Cardiovascular hypertension, Pt. on medications + CAD, + CABG and +CHF Rhythm:Regular Rate:Normal + Systolic murmurs    Neuro/Psych negative neurological ROS  negative psych ROS   GI/Hepatic negative GI ROS, Neg liver ROS,   Endo/Other  diabetes, Insulin DependentHypothyroidism   Renal/GU CRF and DialysisRenal disease  negative genitourinary   Musculoskeletal   Abdominal   Peds  Hematology negative hematology ROS (+)   Anesthesia Other Findings   Reproductive/Obstetrics negative OB ROS                            Anesthesia Physical Anesthesia Plan  ASA: III  Anesthesia Plan: General   Post-op Pain Management:    Induction: Intravenous  Airway Management Planned: LMA  Additional Equipment:   Intra-op Plan:   Post-operative Plan: Extubation in OR  Informed Consent: I have reviewed the patients History and Physical, chart, labs and discussed the procedure including the risks, benefits and alternatives for the proposed anesthesia with the patient or authorized representative who has indicated his/her understanding and acceptance.   Dental advisory given  Plan Discussed with: CRNA  Anesthesia Plan Comments:         Anesthesia Quick Evaluation

## 2014-03-24 NOTE — Progress Notes (Signed)
Subjective:   Tired, OR today for R AKA  Objective Filed Vitals:   03/23/14 2110 03/23/14 2234 03/24/14 0517 03/24/14 0952  BP: 202/84 128/60 105/57 131/62  Pulse: 81  69 69  Temp: 98 F (36.7 C)  98.9 F (37.2 C) 98.6 F (37 C)  TempSrc: Oral  Oral Oral  Resp: 18  17 18   Height:      Weight: 56.9 kg (125 lb 7.1 oz)     SpO2: 100%  96% 97%   Physical Exam General: sleeping, easily aroused. No acute distress Heart: RRR 2/6 systolic murmur Lungs: CTA, unlabored Abdomen: soft, nontender +BS Extremities: no edema. R BKA dressing with serosang drainage. Healed L transmet Dialysis Access: R IJ  Dialysis Orders: MWF @ [facility] for rehab, previously @ [place] 4 hrs 53 kg 2K/2.25Ca 400/A1.5 Heparin 5400 U R IJ catheter Hectorol 1 mcg Aranesp 200 mcg & Venofer 100 mg on Wed  Assessment/Plan: 1. Gangrene of R BKA - AKA, postponed sec to low Hgb, planned for 12/13 per Dr. Sharol Given. 2. Anemia - Hgb 9.9 , Aranesp 200 mcg on Wed, T-sat 13% on 11/18, s/p Venofer loading, now qwk; 2u RBC transfused 12/11 3. ESRD - HD on MWF @ [place] (@ [place] before DC to [place]), K 5.1. Next HD monday 4. HTN/volume - BP 131/62, no meds, no excess fluid. 5. Metabolic bone disease - Ca 7.9 (9 corrected), P 5.3, iPTH 19; Hectorol 1 mcg, Sensipar 30 mg qd, Renvela 2 with meals. Hold Sensipar sec to low iPTH. 6. Nutrition - NPO for OR today Alb 2.3, carb-mod renal diet- resume when diet advansed., vitamin. 7. PVD - L transmet amp 05/2007, R BKA 02/01/14. 8. DM Type 2 - per primary.hypoglycemia in AM- [place], NP [place] [phone] 03/24/2014,10:12 AM  LOS: 4 days   Renal Attending: Agree with note as articulated above.  Ami Mally C   Additional Objective Labs: Basic Metabolic Panel:  Recent Labs Lab 03/20/14 1435  03/22/14 0700 03/23/14 0416 03/24/14 0411  NA 129*  < > 136* 137 133*  K 4.5  < > 3.9 4.5 5.1  CL 86*   < > 96 100 95*  CO2 27  < > 26 24 21   GLUCOSE 129*  < > 44* 46* 121*  BUN 66*  < > 35* 15 25*  CREATININE 6.87*  < > 4.93* 3.15* 4.55*  CALCIUM 7.9*  < > 7.6* 7.6* 7.9*  PHOS 5.3*  --  3.2  --   --   < > = values in this interval not displayed. Liver Function Tests:  Recent Labs Lab 03/20/14 1032 03/20/14 1435 03/22/14 0700  AST 10  --   --   ALT 14  --   --   ALKPHOS 143*  --   --   BILITOT 0.2*  --   --   PROT 7.2  --   --   ALBUMIN 2.5* 2.3* 2.2*   No results for input(s): LIPASE, AMYLASE in the last 168 hours. CBC:  Recent Labs Lab 03/20/14 1434 03/21/14 0507 03/22/14 0700 03/23/14 0416 03/24/14 0411  WBC 11.9* 6.0 12.9* 8.3 7.9  HGB 7.1* 7.1* 7.1* 8.9* 9.9*  HCT 22.2* 22.5* 22.9* 28.5* 30.9*  MCV 96.9 99.1 97.9 95.3 97.5  PLT 311 252 317 213 225   Blood Culture    Component Value Date/Time   SDES BLOOD RIGHT HAND 01/27/2014 1722   SPECREQUEST BOTTLES DRAWN AEROBIC AND ANAEROBIC 5CC  EA 01/27/2014 1722   CULT  01/27/2014 1722    NO GROWTH 5 DAYS Performed at [facility] 02/02/2014 FINAL 01/27/2014 1722    Cardiac Enzymes: No results for input(s): CKTOTAL, CKMB, CKMBINDEX, TROPONINI in the last 168 hours. CBG:  Recent Labs Lab 03/23/14 1123 03/23/14 1615 03/23/14 2112 03/24/14 0746 03/24/14 0830  GLUCAP 140* 118* 164* 53* 62*   Iron Studies: No results for input(s): IRON, TIBC, TRANSFERRIN, FERRITIN in the last 72 hours. @lablastinr3 @ Studies/Results: No results found. Medications:   . sodium chloride   Intravenous Once  . [MAR Hold] aspirin EC  81 mg Oral BID  .  ceFAZolin (ANCEF) IV  2 g Intravenous On Call to OR  . [MAR Hold] cinacalcet  30 mg Oral Q breakfast  . [MAR Hold] darbepoetin (ARANESP) injection - DIALYSIS  200 mcg Intravenous Q Wed-HD  . dextrose      . [MAR Hold] feeding supplement (RESOURCE BREEZE)  1 Container Oral BID BM  . [MAR Hold] ferric gluconate (FERRLECIT/NULECIT) IV  125 mg Intravenous Weekly   . [MAR Hold] insulin aspart  0-9 Units Subcutaneous TID WC  . [MAR Hold] insulin aspart  3 Units Subcutaneous TID WC  . [MAR Hold] insulin detemir  9 Units Subcutaneous QHS  . [MAR Hold] levothyroxine  25 mcg Oral QAC breakfast  . [MAR Hold] metoCLOPramide  5 mg Oral TID AC  . [MAR Hold] multivitamin  1 tablet Oral QHS  . [MAR Hold] pantoprazole  40 mg Oral Daily  . [MAR Hold] sevelamer carbonate  1,600 mg Oral TID WC  . [MAR Hold] sodium chloride  3 mL Intravenous Q12H

## 2014-03-24 NOTE — Anesthesia Procedure Notes (Signed)
Procedure Name: LMA Insertion Date/Time: 03/24/2014 10:59 AM Performed by: Julian Reil Pre-anesthesia Checklist: Patient identified, Emergency Drugs available, Suction available and Patient being monitored Patient Re-evaluated:Patient Re-evaluated prior to inductionOxygen Delivery Method: Circle system utilized Preoxygenation: Pre-oxygenation with 100% oxygen Intubation Type: IV induction LMA: LMA inserted LMA Size: 4.0 Tube type: Oral Number of attempts: 1 Placement Confirmation: positive ETCO2 and breath sounds checked- equal and bilateral Tube secured with: Tape Dental Injury: Teeth and Oropharynx as per pre-operative assessment

## 2014-03-25 ENCOUNTER — Encounter (HOSPITAL_COMMUNITY): Payer: Self-pay | Admitting: Orthopedic Surgery

## 2014-03-25 ENCOUNTER — Inpatient Hospital Stay (HOSPITAL_COMMUNITY): Payer: Medicaid Other

## 2014-03-25 LAB — CBC
HCT: 22.2 % — ABNORMAL LOW (ref 36.0–46.0)
Hemoglobin: 7.1 g/dL — ABNORMAL LOW (ref 12.0–15.0)
MCH: 31.3 pg (ref 26.0–34.0)
MCHC: 32 g/dL (ref 30.0–36.0)
MCV: 97.8 fL (ref 78.0–100.0)
Platelets: 214 10*3/uL (ref 150–400)
RBC: 2.27 MIL/uL — ABNORMAL LOW (ref 3.87–5.11)
RDW: 18 % — AB (ref 11.5–15.5)
WBC: 5.5 10*3/uL (ref 4.0–10.5)

## 2014-03-25 LAB — PREPARE RBC (CROSSMATCH)

## 2014-03-25 LAB — BASIC METABOLIC PANEL
ANION GAP: 13 (ref 5–15)
BUN: 35 mg/dL — AB (ref 6–23)
CO2: 24 mEq/L (ref 19–32)
CREATININE: 5.63 mg/dL — AB (ref 0.50–1.10)
Calcium: 6.9 mg/dL — ABNORMAL LOW (ref 8.4–10.5)
Chloride: 99 mEq/L (ref 96–112)
GFR calc non Af Amer: 8 mL/min — ABNORMAL LOW (ref >90)
GFR, EST AFRICAN AMERICAN: 9 mL/min — AB (ref >90)
Glucose, Bld: 80 mg/dL (ref 70–99)
Potassium: 4.5 mEq/L (ref 3.7–5.3)
Sodium: 136 mEq/L — ABNORMAL LOW (ref 137–147)

## 2014-03-25 LAB — GLUCOSE, CAPILLARY
GLUCOSE-CAPILLARY: 188 mg/dL — AB (ref 70–99)
Glucose-Capillary: 64 mg/dL — ABNORMAL LOW (ref 70–99)
Glucose-Capillary: 67 mg/dL — ABNORMAL LOW (ref 70–99)
Glucose-Capillary: 158 mg/dL — ABNORMAL HIGH (ref 70–99)
Glucose-Capillary: 221 mg/dL — ABNORMAL HIGH (ref 70–99)

## 2014-03-25 MED ORDER — LIDOCAINE-PRILOCAINE 2.5-2.5 % EX CREA: 1.0000 "application " | TOPICAL_CREAM | CUTANEOUS | Status: DC | PRN

## 2014-03-25 MED ORDER — SODIUM CHLORIDE 0.9 % IV SOLN: 100.0000 mL | INTRAVENOUS | Status: DC | PRN

## 2014-03-25 MED ORDER — SODIUM CHLORIDE 0.9 % IJ SOLN
10.0000 mL | INTRAMUSCULAR | Status: DC | PRN
  Administered 2014-03-26: 10 mL
  Filled 2014-03-25: qty 40

## 2014-03-25 MED ORDER — PENTAFLUOROPROP-TETRAFLUOROETH EX AERO: 1.0000 "application " | INHALATION_SPRAY | CUTANEOUS | Status: DC | PRN

## 2014-03-25 MED ORDER — SODIUM CHLORIDE 0.9 % IV SOLN: Freq: Once | INTRAVENOUS | Status: DC

## 2014-03-25 MED ORDER — NEPRO/CARBSTEADY PO LIQD: 237.0000 mL | ORAL | Status: DC | PRN

## 2014-03-25 MED ORDER — ALTEPLASE 2 MG IJ SOLR
2.0000 mg | Freq: Once | INTRAMUSCULAR | Status: AC | PRN
  Administered 2014-03-25: 2 mg
  Filled 2014-03-25: qty 2

## 2014-03-25 MED ORDER — ALTEPLASE 100 MG IV SOLR
4.0000 mg | INTRAVENOUS | Status: AC
  Filled 2014-03-25: qty 4

## 2014-03-25 MED ORDER — LIDOCAINE HCL 1 % IJ SOLN
INTRAMUSCULAR | Status: AC
  Filled 2014-03-25: qty 20

## 2014-03-25 MED ORDER — HEPARIN SODIUM (PORCINE) 1000 UNIT/ML DIALYSIS
1000.0000 [IU] | INTRAMUSCULAR | Status: DC | PRN
  Filled 2014-03-25: qty 1

## 2014-03-25 MED ORDER — LIDOCAINE HCL (PF) 1 % IJ SOLN: 5.0000 mL | INTRAMUSCULAR | Status: DC | PRN

## 2014-03-25 MED ORDER — MORPHINE SULFATE 2 MG/ML IJ SOLN
INTRAMUSCULAR | Status: AC
  Administered 2014-03-25: 2 mg via INTRAVENOUS
  Filled 2014-03-25: qty 1

## 2014-03-25 MED ORDER — MORPHINE SULFATE 2 MG/ML IJ SOLN
2.0000 mg | INTRAMUSCULAR | Status: DC | PRN
  Administered 2014-03-25 – 2014-03-26 (×9): 2 mg via INTRAVENOUS
  Filled 2014-03-25 (×9): qty 1

## 2014-03-25 MED ORDER — ALTEPLASE 100 MG IV SOLR
4.0000 mg | INTRAVENOUS | Status: AC
  Administered 2014-03-25: 4 mg
  Filled 2014-03-25: qty 4

## 2014-03-25 MED ORDER — INSULIN DETEMIR 100 UNIT/ML ~~LOC~~ SOLN
5.0000 [IU] | Freq: Every day | SUBCUTANEOUS | Status: DC
  Administered 2014-03-25 – 2014-03-26 (×2): 5 [IU] via SUBCUTANEOUS
  Filled 2014-03-25 (×3): qty 0.05

## 2014-03-25 NOTE — Progress Notes (Signed)
Patient's dinner arrived.  The order did not come with the mayo that she requested.  She complained to the [facility] about this.  I explained to the patient that she was over her limits on sodium and she could not have any mayo.  The patient stated that the would not eat then.  I explained to the patient that this was her choice and we were not keeping her from eating.  Jillyn Ledger, MBA, BS, RN

## 2014-03-25 NOTE — Procedures (Signed)
Successful placement of a left IJ approach 21 cm dual lumen non-tunneled PICC line with tip at the superior caval-atrial junction.  The PICC line is ready for immediate use.

## 2014-03-25 NOTE — Progress Notes (Signed)
Hypoglycemic Event  CBG: 64  Treatment: 15 GM carbohydrate snack  Symptoms: None  Follow-up CBG: Time:13:24 CBG Result: 67  Possible Reasons for Event: Inadequate meal intake   Treatment: 15 GM carbohydrate snack  Symptoms: None  Follow-up CBG: Time: 15:00 CBG Result: 158  Comments/MD notified: Lantus dose changed    Lashaunta Sicard R  Remember to initiate Hypoglycemia Order Set & complete

## 2014-03-25 NOTE — Progress Notes (Signed)
Subjective:  Seen on dialysis, right leg pain s/p surgery yesterday, no other complaints  Objective: Vital signs in last 24 hours: Temp:  [98 F (36.7 C)-98.6 F (37 C)] 98.2 F (36.8 C) (12/14 0725) Pulse Rate:  [69-104] 70 (12/14 0758) Resp:  [10-20] 19 (12/14 0725) BP: (92-191)/(48-97) 125/63 mmHg (12/14 0758) SpO2:  [97 %-100 %] 99 % (12/14 0725) Weight:  [57.8 kg (127 lb 6.8 oz)-58.3 kg (128 lb 8.5 oz)] 58.3 kg (128 lb 8.5 oz) (12/14 0725) Weight change: 0.9 kg (1 lb 15.7 oz)  Intake/Output from previous day: 12/13 0701 - 12/14 0700 In: 740 [P.O.:240; I.V.:500] Out: -  Intake/Output this shift:   Lab Results:  Recent Labs  03/24/14 0411 03/25/14 0500  WBC 7.9 5.5  HGB 9.9* 7.1*  HCT 30.9* 22.2*  PLT 225 214   BMET:  Recent Labs  03/23/14 0416 03/24/14 0411  NA 137 133*  K 4.5 5.1  CL 100 95*  CO2 24 21  GLUCOSE 46* 121*  BUN 15 25*  CREATININE 3.15* 4.55*  CALCIUM 7.6* 7.9*   No results for input(s): PTH in the last 72 hours. Iron Studies: No results for input(s): IRON, TIBC, TRANSFERRIN, FERRITIN in the last 72 hours.  Studies/Results: No results found.   EXAM: General appearance: Alert, in no apparent distress Resp: CTA without rales, rhonchi, or wheezes Cardio: RRR with Gr II/VI systolic murmur, no rub GI: + BS, soft and nontender Extremities: Dressing over R AKA, well-healed L TMA, no edema Access: R IJ catheter  Dialysis Orders: MWF @ [facility] for rehab, previously @ [place] 4 hrs 53 kg 2K/2.25Ca 400/A1.5 Heparin 5400 U R IJ catheter Hectorol 1 mcg Aranesp 200 mcg & Venofer 100 mg on Wed  Assessment/Plan: 1. Gangrene of R BKA - s/p AKA per Dr. Sharol Given yesterday. 2. Anemia - Surgery initially postponed sec to low Hgb, s/p 2 U PRBCs 12/11, but down to 7.1 post-surgery, Aranesp 200 mcg on Wed, T-sat 13% on 11/18, s/p Venofer loading, now qwk.  Transfuse 2 U PRBCs today. 3. ESRD - HD on MWF @ [place] (@ [place] before DC  to [place]), K 5.1. HD today. 4. HTN/volume - BP 125/63, no meds; wt 56.3 kg, tolerating UF 3 L. 5. Metabolic bone disease - Ca 7.9 (9.3 corrected), P 5.3, iPTH 19; Hectorol 1 mcg, Sensipar 30 mg qd, Renvela 2 with meals. Hold Sensipar sec to low iPTH. 6. Nutrition - Alb 2.2, carb-mod renal diet, vitamin. 7. PVD - L transmet amp 05/2007, R BKA 02/01/14, R AKA yesterday. 8. DM Type 2 - per primary.    LOS: 5 days   Brandi Arellano,Brandi Arellano 03/25/2014,8:10 AM  Pt seen, examined and agree w A/P as above.  [name] MD pager [phone]    cell [phone] 03/25/2014, 9:34 AM

## 2014-03-25 NOTE — Progress Notes (Signed)
Patient ID: Brandi Arellano, female   DOB: [birth year], 58 y.o.   MRN: [id number] Postoperative day 1 right above-the-knee amputation. Patient did have decreased microcirculation with minimal petechial bleeding. Patient to be evaluated for discharge to skilled nursing. Plan for discharge when skilled nursing bed available.

## 2014-03-25 NOTE — Progress Notes (Addendum)
Inpatient Diabetes Program Recommendations  AACE/ADA: New Consensus Statement on Inpatient Glycemic Control (2013)  Target Ranges:  Prepandial:   less than 140 mg/dL      Peak postprandial:   less than 180 mg/dL (1-2 hours)      Critically ill patients:  140 - 180 mg/dL   Fastings are low in the am's  Please reduce levemir to 5 units at [facility]  Inpatient Diabetes Program Recommendations Insulin - Basal: Please decrease levemir to 5 units at HS. Pt having low fastings q am (typically caused by too much basal insulin)   Correction (SSI): continue as ordered Insulin - Meal Coverage: continue while on Decadron  Pt may still need the 3 units meal coverage even without decadron to prevent glucose elevations requiring higher doses of correction scale-to be given only if pt eats greater than or equal to 50% and/or feeding supplement.  Thank you, Rosita Kea, RN, CNS, Diabetes Coordinator [phone])  AD: Spoke with Autumn, Dr Jess Barters assistant/PA to get an order to decrease levemir to 5 units at this time (until/if fasting glucose elevates above 180 mg/dL)-order received and entered.  Thank you, Rosita Kea, RN, CNS, Diabetes Coordinator [phone])

## 2014-03-25 NOTE — Progress Notes (Addendum)
PT Cancellation Note  Patient Details Name: Brandi Arellano MRN: [id number] DOB: November 18, 1955   Cancelled Treatment:    Reason Eval/Treat Not Completed: Patient declined, no reason specified.  Pt appears frustrated when PT enters and refuses any mobility at this time. Pt was educated on benefits of OOB and working with therapy, and pt became somewhat agitated. Residual limb in flexed position with multiple pillows under thigh, and PT encouraged repositioning - pt also declined this. Will continue to follow and complete PT eval when able.    Rolinda Roan 03/25/2014, 3:10 PM   Rolinda Roan, PT, DPT Acute Rehabilitation Services Pager: [phone]

## 2014-03-25 NOTE — Progress Notes (Signed)
Hemodialysis- Cath not functioning well, pulling sluggishly. Pushed well. Ran for 45 minutes at up to 400/bfr then AP high, when flushing cath noticed strong resistance. TPA instilled. Order placed for 2 units prbcs on HD today if able. Per Dr. Jonnie Finner if able to give 2 units on HD do so, if blood not ready (pt has antibodies per hx) then give one unit on floor after HD. Will communicate to floor RN. Pt remains stable, some pain in R stump.

## 2014-03-25 NOTE — Procedures (Signed)
I was present at this dialysis session, have reviewed the session itself and made  appropriate changes  [name] MD (pgr) [phone]    [phone] 03/25/2014, 9:34 AM

## 2014-03-25 NOTE — Progress Notes (Signed)
Hemodialysis- Cath not functioning after TPA. Per [place] until after 2pm this afternoon and bring back to complete tx and receive blood.

## 2014-03-26 LAB — TYPE AND SCREEN
ABO/RH(D): O POS
ANTIBODY SCREEN: POSITIVE
DAT, IgG: POSITIVE
Unit division: 0
Unit division: 0

## 2014-03-26 LAB — GLUCOSE, CAPILLARY
GLUCOSE-CAPILLARY: 103 mg/dL — AB (ref 70–99)
Glucose-Capillary: 142 mg/dL — ABNORMAL HIGH (ref 70–99)
Glucose-Capillary: 113 mg/dL — ABNORMAL HIGH (ref 70–99)
Glucose-Capillary: 126 mg/dL — ABNORMAL HIGH (ref 70–99)

## 2014-03-26 NOTE — Progress Notes (Signed)
Patient ID: Brandi Arellano, female   DOB: June 02, 1955, 58 y.o.   MRN: [id number] Patient states that her pain is improving. Her dressing is clean and dry. Patient wishes to be discharged to skilled nursing but does not want to go back to her previous location. Patient does request outpatient skilled nursing.

## 2014-03-26 NOTE — Progress Notes (Signed)
Subjective:  Mild right leg pain; otherwise, no complaints, but does not wants to be discharged to previous SNF for rehab   Objective: Vital signs in last 24 hours: Temp:  [97.8 F (36.6 C)-98.4 F (36.9 C)] 98 F (36.7 C) (12/15 0517) Pulse Rate:  [63-83] 82 (12/15 0517) Resp:  [15-20] 19 (12/15 0517) BP: (89-158)/(44-69) 158/62 mmHg (12/15 0517) SpO2:  [96 %-100 %] 99 % (12/15 0517) Weight:  [56.2 kg (123 lb 14.4 oz)-58.3 kg (128 lb 8.5 oz)] 56.79 kg (125 lb 3.2 oz) (12/14 2102) Weight change: 0.5 kg (1 lb 1.6 oz)  Intake/Output from previous day: 12/14 0701 - 12/15 0700 In: 790 [I.V.:120; Blood:670] Out: 348  Intake/Output this shift:   Lab Results:  Recent Labs  03/24/14 0411 03/25/14 0500  WBC 7.9 5.5  HGB 9.9* 7.1*  HCT 30.9* 22.2*  PLT 225 214   BMET:  Recent Labs  03/24/14 0411 03/25/14 0500  NA 133* 136*  K 5.1 4.5  CL 95* 99  CO2 21 24  GLUCOSE 121* 80  BUN 25* 35*  CREATININE 4.55* 5.63*  CALCIUM 7.9* 6.9*   No results for input(s): PTH in the last 72 hours. Iron Studies: No results for input(s): IRON, TIBC, TRANSFERRIN, FERRITIN in the last 72 hours.  EXAM: General appearance: Alert, in no apparent distress Resp: CTA without rales, rhonchi, or wheezes Cardio: RRR with Gr II/VI systolic murmur, no rub GI: + BS, soft and nontender Extremities: R AKA wrapped, well-healed L TMA, no edema Access: R IJ catheter  Dialysis Orders: MWF @ [facility] for rehab, pre viously @ [place] 4 hrs 53 kg 2K/2.25Ca 400/A1.5 Heparin 5400 U R IJ catheter Hectorol 1 mcg Aranesp 200 mcg & Venofer 100 mg on Wed  Assessment/Plan: 1. Gangrene of R BKA - s/p AKA per Dr. Sharol Given 12/13, stable. 2. Anemia - Surgery initially postponed sec to low Hgb, s/p 2 U PRBCs 12/11, but down to 7.1 post-surgery, received 2 U yesterday, Aranesp 200 mcg on Wed, T-sat 13% on 11/18, s/p Venofer loading, now qwk. 3. ESRD - HD on MWF @ [facility] (@ [place] before DC to  [place]), K 4.5. HD tomorrow. 4. HTN/volume - BP 158/62, no meds; wt 56.8 kg, no excess fluid. 5. Metabolic bone disease - Ca 6.9 (8.4 corrected), P 5.3, iPTH 19; Hectorol 1 mcg, Sensipar 30 mg qd (on hold sec to low iPTH), Renvela 2 with meals. 6. Nutrition - Alb 2.2, carb-mod renal diet, vitamin. 7. PVD - L transmet amp 05/2007, R BKA 02/01/14, R AKA 12/13. 8. DM Type 2 - per primary.    LOS: 6 days   LYLES,CHARLES 03/26/2014,7:15 AM  Pt seen, examined and agree w A/P as above. Doing well after BK > AKA surgery.  Lots of pain, however. Plan HD in am.  [name] MD pager [phone]    cell [phone] 03/26/2014, 11:03 AM

## 2014-03-26 NOTE — Progress Notes (Signed)
PT Cancellation Note  Patient Details Name: Cardelia Sassano MRN: [id number] DOB: 11/26/1955   Cancelled Treatment:    Reason Eval/Treat Not Completed: Patient declined, no reason specified.  PT attempted to complete evaluation this morning, and pt refusing to participate. Pt was educated on benefits of early mobility with PT after surgery, as pt was stating "it's too soon". Residual limb continues to be resting in hip flexion, with many pillows and blankets propped underneath. Pt refusing any attempts at repositioning at this time. Will continue to follow to complete PT eval.   Rolinda Roan 03/26/2014, 1:32 PM   Rolinda Roan, PT, DPT Acute Rehabilitation Services Pager: [phone]

## 2014-03-27 LAB — CBC
HEMATOCRIT: 30.8 % — AB (ref 36.0–46.0)
Hemoglobin: 9.8 g/dL — ABNORMAL LOW (ref 12.0–15.0)
MCH: 30 pg (ref 26.0–34.0)
MCHC: 31.8 g/dL (ref 30.0–36.0)
MCV: 94.2 fL (ref 78.0–100.0)
Platelets: 177 10*3/uL (ref 150–400)
RBC: 3.27 MIL/uL — ABNORMAL LOW (ref 3.87–5.11)
RDW: 21.9 % — AB (ref 11.5–15.5)
WBC: 5 10*3/uL (ref 4.0–10.5)

## 2014-03-27 LAB — RENAL FUNCTION PANEL
ALBUMIN: 2.1 g/dL — AB (ref 3.5–5.2)
ANION GAP: 11 (ref 5–15)
BUN: 31 mg/dL — ABNORMAL HIGH (ref 6–23)
CO2: 27 mEq/L (ref 19–32)
Calcium: 7.4 mg/dL — ABNORMAL LOW (ref 8.4–10.5)
Chloride: 99 mEq/L (ref 96–112)
Creatinine, Ser: 5.26 mg/dL — ABNORMAL HIGH (ref 0.50–1.10)
GFR calc non Af Amer: 8 mL/min — ABNORMAL LOW (ref >90)
GFR, EST AFRICAN AMERICAN: 10 mL/min — AB (ref >90)
Glucose, Bld: 61 mg/dL — ABNORMAL LOW (ref 70–99)
PHOSPHORUS: 4.1 mg/dL (ref 2.3–4.6)
POTASSIUM: 4.3 meq/L (ref 3.7–5.3)
SODIUM: 137 meq/L (ref 137–147)

## 2014-03-27 MED ORDER — HEPARIN SODIUM (PORCINE) 1000 UNIT/ML DIALYSIS: 1000.0000 [IU] | INTRAMUSCULAR | Status: DC | PRN

## 2014-03-27 MED ORDER — NEPRO/CARBSTEADY PO LIQD: 237.0000 mL | ORAL | Status: DC | PRN

## 2014-03-27 MED ORDER — DARBEPOETIN ALFA 200 MCG/0.4ML IJ SOSY
PREFILLED_SYRINGE | INTRAMUSCULAR | Status: AC
  Administered 2014-03-27: 200 ug via INTRAVENOUS
  Filled 2014-03-27: qty 0.4

## 2014-03-27 MED ORDER — HYDROMORPHONE HCL 1 MG/ML IJ SOLN
INTRAMUSCULAR | Status: AC
  Administered 2014-03-27: 1 mg via INTRAVENOUS
  Filled 2014-03-27: qty 1

## 2014-03-27 MED ORDER — LIDOCAINE-PRILOCAINE 2.5-2.5 % EX CREA: 1.0000 "application " | TOPICAL_CREAM | CUTANEOUS | Status: DC | PRN

## 2014-03-27 MED ORDER — TRAMADOL HCL 50 MG PO TABS: 50.0000 mg | ORAL_TABLET | Freq: Four times a day (QID) | ORAL | Status: DC | PRN

## 2014-03-27 MED ORDER — LIDOCAINE HCL (PF) 1 % IJ SOLN: 5.0000 mL | INTRAMUSCULAR | Status: DC | PRN

## 2014-03-27 MED ORDER — SODIUM CHLORIDE 0.9 % IV SOLN: 100.0000 mL | INTRAVENOUS | Status: DC | PRN

## 2014-03-27 MED ORDER — ALTEPLASE 2 MG IJ SOLR: 2.0000 mg | Freq: Once | INTRAMUSCULAR | Status: DC | PRN

## 2014-03-27 MED ORDER — ALBUMIN HUMAN 25 % IV SOLN
INTRAVENOUS | Status: AC
  Administered 2014-03-27: 25 g via INTRAVENOUS
  Filled 2014-03-27: qty 100

## 2014-03-27 MED ORDER — PENTAFLUOROPROP-TETRAFLUOROETH EX AERO: 1.0000 "application " | INHALATION_SPRAY | CUTANEOUS | Status: DC | PRN

## 2014-03-27 MED ORDER — ALBUMIN HUMAN 25 % IV SOLN
25.0000 g | Freq: Once | INTRAVENOUS | Status: AC
  Administered 2014-03-27: 25 g via INTRAVENOUS

## 2014-03-27 NOTE — Clinical Social Work Note (Signed)
Patient is medically stable for discharge back to [facility] today. Facility contacted and informed of patient's readiness to return to facility and discharge summary transmitted to facility. Patient will be trasnported to skilled nursing facility by ambulance.  Levada Bowersox Givens, MSW, [place] [phone]

## 2014-03-27 NOTE — Discharge Summary (Signed)
Physician Discharge Summary  Patient ID: Brandi Arellano MRN: [id number] DOB/AGE: [birth year] 58 y.o.  Admit date: 03/20/2014 Discharge date: 03/27/2014  Admission Diagnoses: Gangrene right transtibial amputation  Discharge Diagnoses:  Active Problems:   Gangrene associated with diabetes mellitus   Discharged Condition: stable  Hospital Course: Patient's hospital course was essentially unremarkable. She presented with anemia and required admission prior to surgery with transfusion of red blood cells. Postoperatively patient progressed well and was discharged back to skilled nursing.  Consults: nephrology  Significant Diagnostic Studies: labs: Routine labs Treatments: dialysis: Hemodialysis and surgery: See operative note  Discharge Exam: Blood pressure 117/63, pulse 72, temperature 98 F (36.7 C), temperature source Oral, resp. rate 16, height 5\' 6"  (1.676 m), weight 56.79 kg (125 lb 3.2 oz), SpO2 95 %. Incision/Wound: dressing clean and dry  Disposition: 03-Skilled Nursing Facility  Discharge Instructions    Call MD / Call 911    Complete by:  As directed   If you experience chest pain or shortness of breath, CALL 911 and be transported to the hospital emergency room.  If you develope a fever above 101 F, pus (white drainage) or increased drainage or redness at the wound, or calf pain, call your surgeon's office.     Change dressing    Complete by:  As directed   Change dressing when necessary     Constipation Prevention    Complete by:  As directed   Drink plenty of fluids.  Prune juice may be helpful.  You may use a stool softener, such as Colace (over the counter) 100 mg twice a day.  Use MiraLax (over the counter) for constipation as needed.     Diet - low sodium heart healthy    Complete by:  As directed      Increase activity slowly as tolerated    Complete by:  As directed      Non weight bearing    Complete by:  As directed   Laterality:  right  Extremity:  Lower             Medication List    STOP taking these medications        oxyCODONE 5 MG immediate release tablet  Commonly known as:  Oxy IR/ROXICODONE     pantoprazole 40 MG tablet  Commonly known as:  PROTONIX      TAKE these medications        aspirin EC 81 MG tablet  Take 81 mg by mouth 2 (two) times daily.     cinacalcet 30 MG tablet  Commonly known as:  SENSIPAR  Take 1 tablet (30 mg total) by mouth daily with breakfast.     insulin aspart 100 UNIT/ML injection  Commonly known as:  novoLOG  Inject 0-15 Units into the skin 3 (three) times daily with meals.     insulin detemir 100 UNIT/ML injection  Commonly known as:  LEVEMIR  Inject 0.09 mLs (9 Units total) into the skin at bedtime.     levothyroxine 25 MCG tablet  Commonly known as:  SYNTHROID, LEVOTHROID  Take 25 mcg by mouth daily before breakfast.     multivitamin Tabs tablet  Take 1 tablet by mouth at bedtime.     omeprazole 20 MG capsule  Commonly known as:  PRILOSEC  Take 20 mg by mouth daily.     sevelamer carbonate 800 MG tablet  Commonly known as:  RENVELA  Take 2 tablets (1,600 mg total) by mouth 3 (three) times daily  with meals.     traMADol 50 MG tablet  Commonly known as:  ULTRAM  Take 1 tablet (50 mg total) by mouth every 6 (six) hours as needed for moderate pain or severe pain. Maximum dose= 8 tablets per day      ASK your doctor about these medications        metoCLOPramide 5 MG tablet  Commonly known as:  REGLAN  Take 1 tablet (5 mg total) by mouth 3 (three) times daily before meals. Don't take after 02/16/14.           Follow-up Information    Follow up with DUDA,MARCUS V, MD In 2 weeks.   Specialty:  Orthopedic Surgery   Contact information:   [place] [address] [phone]       Signed: Newt Minion 03/27/2014, 6:32 AM

## 2014-03-27 NOTE — Progress Notes (Signed)
Hypoglycemic Event  CBG: 59   Treatment: 15 GM carbohydrate snack  Symptoms: None  Follow-up CBG: Time:  CBG Result:177  Possible Reasons for Event: Inadequate meal intake and Other: Did not eat breakfast   Comments/MD notified: MD aware     Page Pucciarelli A  Remember to initiate Hypoglycemia Order Set & complete

## 2014-03-27 NOTE — Progress Notes (Signed)
[facility] Progress Note  Assessment/Plan: 1. 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 ESRD - MWF - need to establish new edw at d/c 3. Anemia - Hgb 9.8 s/p several transfusions max ESA and weekly venofer 4. Secondary hyperparathyroidism - stopped cinacalcet and off hectorol- low iPTH - s - will clarify with NH at d/c 5. HTN/volume - BP variable- significantly above pre AKA EDW by measures and exam ??  What true edw should be; will lower temp, add profile and give alb to try to gt BP up and volume off - challenge as BP allows 6. Nutrition - alb low - work on nutrition support after d/c - liberalize diet to promote intake 7. DM - per primary  Myriam Jacobson, PA-C [place] [phone] 03/27/2014,8:34 AM  LOS: 7 days   Pt seen, examined and agree w A/P as above.  [name] MD pager [phone]    cell [phone] 03/27/2014, 2:00 PM    Subjective:   Denies sob; thinks her hand is swollen because she laid on it last night; had BM yesterday  Objective Filed Vitals:   03/27/14 0718 03/27/14 0731 03/27/14 0749 03/27/14 0816  BP: 161/79 96/43 112/61 128/68  Pulse: 83 83 82 82  Temp:      TempSrc:      Resp:  $Remo'10 12 12  'xwIQd$ Height:      Weight:      SpO2:       Physical Exam BP in 90s sats 99% on room air General: NAD, generalized facial edema Heart: RRR 2/6 murmur Lungs: no overt crackles, dim BS Abdomen: soft NT Extremities: left upper extrem ++ +edema, + on right, LLE with transmet + edema; right AKA wrapped Dialysis Access: right IJ  Dialysis Orders: MWF @ [facility] for rehab, pre viously @ [place] [facility] 4 hrs 53 kg 2K/2.25Ca 400/A1.5 Heparin 5400 U R IJ catheter Hectorol 1 mcg Aranesp 200 mcg & Venofer 100 mg on Wed  Additional Objective Labs: Basic Metabolic Panel:  Recent Labs Lab 03/20/14 1435  03/22/14 0700  03/24/14 0411 03/25/14 0500 03/27/14 0500  NA 129*  < > 136*  < > 133* 136* 137   K 4.5  < > 3.9  < > 5.1 4.5 4.3  CL 86*  < > 96  < > 95* 99 99  CO2 27  < > 26  < > $R'21 24 27  'Mo$ GLUCOSE 129*  < > 44*  < > 121* 80 61*  BUN 66*  < > 35*  < > 25* 35* 31*  CREATININE 6.87*  < > 4.93*  < > 4.55* 5.63* 5.26*  CALCIUM 7.9*  < > 7.6*  < > 7.9* 6.9* 7.4*  PHOS 5.3*  --  3.2  --   --   --  4.1  < > = values in this interval not displayed. Liver Function Tests:  Recent Labs Lab 03/20/14 1032 03/20/14 1435 03/22/14 0700 03/27/14 0500  AST 10  --   --   --   ALT 14  --   --   --   ALKPHOS 143*  --   --   --   BILITOT 0.2*  --   --   --   PROT 7.2  --   --   --   ALBUMIN 2.5* 2.3* 2.2* 2.1*   CBC:  Recent Labs Lab 03/22/14 0700 03/23/14 0416 03/24/14 0411 03/25/14 0500 03/27/14 0702  WBC 12.9* 8.3  7.9 5.5 5.0  HGB 7.1* 8.9* 9.9* 7.1* 9.8*  HCT 22.9* 28.5* 30.9* 22.2* 30.8*  MCV 97.9 95.3 97.5 97.8 94.2  PLT 317 213 225 214 177  CBG:  Recent Labs Lab 03/25/14 2059 03/26/14 0826 03/26/14 1255 03/26/14 1616 03/26/14 2146  GLUCAP 221* 103* 142* 113* 126*  Studies/Results: Ir Fluoro Guide Cv Line Left  03/25/2014   INDICATION: End-stage renal disease. In need of intravenous access for medication administration and blood draws.  EXAM: ULTRASOUND AND FLUOROSCOPIC GUIDED INTERNAL JUGULAR APPROACH PICC LINE INSERTION  MEDICATIONS: None.  CONTRAST:  None  FLUOROSCOPY TIME:  12 seconds.  COMPLICATIONS: None immediate  TECHNIQUE: The procedure, risks, benefits, and alternatives were explained to the patient and informed written consent was obtained. A timeout was performed prior to the initiation of the procedure.  Given the presence of the patient's existing right internal jugular approach dialysis catheter, the decision was made to place a left internal jugular approach non tunneled PICC line.  The left neck and upper chest were prepped with chlorhexidine in a sterile fashion, and a sterile drape was applied covering the operative field. Maximum barrier sterile  technique with sterile gowns and gloves were used for the procedure. A timeout was performed prior to the initiation of the procedure. Local anesthesia was provided with 1% lidocaine.  Under direct ultrasound guidance, the left internal jugular vein was accessed with a micropuncture kit after the overlying soft tissues were anesthetized with 1% lidocaine. An ultrasound image was saved for documentation purposes. A guidewire was advanced to the level of the superior caval-atrial junction for measurement purposes and the PICC line was cut to length. A peel-away sheath was placed and a 21 cm, 5 [place], dual lumen was inserted to level of the superior caval-atrial junction. A post procedure spot fluoroscopic was obtained. The catheter easily aspirated and flushed and was sutured in place. A dressing was placed. The patient tolerated the procedure well without immediate post procedural complication.  FINDINGS: After catheter placement, the tip lies within the superior cavoatrial junction. The catheter aspirates and flushes normally and is ready for immediate use.  IMPRESSION: Successful ultrasound and fluoroscopic guided placement of a left internal jugular vein approach, 21 cm, 5 French, dual lumen PICC with tip at the superior caval-atrial junction. The PICC line is ready for immediate use.   Electronically Signed   By: Sandi Mariscal M.D.   On: 03/25/2014 13:30   Ir US Guide Vasc Access Left  03/25/2014   INDICATION: End-stage renal disease. In need of intravenous access for medication administration and blood draws.  EXAM: ULTRASOUND AND FLUOROSCOPIC GUIDED INTERNAL JUGULAR APPROACH PICC LINE INSERTION  MEDICATIONS: None.  CONTRAST:  None  FLUOROSCOPY TIME:  12 seconds.  COMPLICATIONS: None immediate  TECHNIQUE: The procedure, risks, benefits, and alternatives were explained to the patient and informed written consent was obtained. A timeout was performed prior to the initiation of the procedure.  Given the presence  of the patient's existing right internal jugular approach dialysis catheter, the decision was made to place a left internal jugular approach non tunneled PICC line.  The left neck and upper chest were prepped with chlorhexidine in a sterile fashion, and a sterile drape was applied covering the operative field. Maximum barrier sterile technique with sterile gowns and gloves were used for the procedure. A timeout was performed prior to the initiation of the procedure. Local anesthesia was provided with 1% lidocaine.  Under direct ultrasound guidance, the left internal jugular vein  was accessed with a micropuncture kit after the overlying soft tissues were anesthetized with 1% lidocaine. An ultrasound image was saved for documentation purposes. A guidewire was advanced to the level of the superior caval-atrial junction for measurement purposes and the PICC line was cut to length. A peel-away sheath was placed and a 21 cm, 5 [place], dual lumen was inserted to level of the superior caval-atrial junction. A post procedure spot fluoroscopic was obtained. The catheter easily aspirated and flushed and was sutured in place. A dressing was placed. The patient tolerated the procedure well without immediate post procedural complication.  FINDINGS: After catheter placement, the tip lies within the superior cavoatrial junction. The catheter aspirates and flushes normally and is ready for immediate use.  IMPRESSION: Successful ultrasound and fluoroscopic guided placement of a left internal jugular vein approach, 21 cm, 5 French, dual lumen PICC with tip at the superior caval-atrial junction. The PICC line is ready for immediate use.   Electronically Signed   By: Sandi Mariscal M.D.   On: 03/25/2014 13:30   Medications: . sodium chloride 10 mL/hr at 03/26/14 1502   . sodium chloride   Intravenous Once  . sodium chloride   Intravenous Once  . sodium chloride   Intravenous Once  . aspirin EC  81 mg Oral BID  . cinacalcet  30 mg  Oral Q breakfast  . darbepoetin (ARANESP) injection - DIALYSIS  200 mcg Intravenous Q Wed-HD  . docusate sodium  100 mg Oral BID  . feeding supplement (RESOURCE BREEZE)  1 Container Oral BID BM  . ferric gluconate (FERRLECIT/NULECIT) IV  125 mg Intravenous Weekly  . insulin aspart  0-9 Units Subcutaneous TID WC  . insulin aspart  3 Units Subcutaneous TID WC  . insulin detemir  5 Units Subcutaneous QHS  . levothyroxine  25 mcg Oral QAC breakfast  . metoCLOPramide  5 mg Oral TID AC  . multivitamin  1 tablet Oral QHS  . pantoprazole  40 mg Oral Daily  . sevelamer carbonate  1,600 mg Oral TID WC  . sodium chloride  3 mL Intravenous Q12H

## 2014-03-27 NOTE — Progress Notes (Signed)
Called report to Nurse at [facility]. Pt. Discharged via [facility] [address] [facility] [address]. Hemodynamically stable.   Penni Bombard, RN 03/27/2014

## 2014-03-27 NOTE — Clinical Social Work Psychosocial (Addendum)
Clinical Social Work Department BRIEF PSYCHOSOCIAL ASSESSMENT 03/27/2014  Patient:  Brandi Arellano, Brandi Arellano     Account Number:  [id number]     Admit date:  03/20/2014  Clinical Social Worker:  Frederico Hamman  Date/Time:  03/27/2014 12:38 PM  Referred by:  Physician  Date Referred:  03/24/2014 Referred for  SNF Placement   Other Referral:   Interview type:  Patient Other interview type:    PSYCHOSOCIAL DATA Living Status:  FACILITY Admitted from facility:  [facility] Level of care:  [place] Primary support name:  Janith Lima Primary support relationship to patient:  [place] Degree of support available:   Patient has 3 brothers: Thana Farr and Iran Planas. Patient also mentioned that her daughter lives in [place] and takes care of perople in their homes. Ideally patient would like for daughter to be able to care for her and be paid.    CURRENT CONCERNS Current Concerns  Post-Acute Placement   Other Concerns:    SOCIAL WORK ASSESSMENT / PLAN CSW talked with patient on 03/26/14 regarding discharge planning and returning to [facility] at discharge. Initially patient stated no and voiced some complaints about the facility. Although patient is Medicaid only, CSW offered to attempt to find another facility that would take her, but patient then indicated that she will return to facility and began to share with CSW what she does at the facility and how she enjoys playing her music.   Assessment/plan status:  No Further Intervention Required Other assessment/ plan:   Information/referral to community resources:   None needed or requested at this time.    PATIENT'S/FAMILY'S RESPONSE TO PLAN OF CARE: Ms. Lasseigne remembered CSW from previous hospital stay and was very pleasant and talkative. She is in agreement with returning to [facility] at discharge.      Muzamil Harker Givens, MSW, [place] [phone]

## 2014-03-27 NOTE — Progress Notes (Signed)
Discharged via stretcher with [facility].

## 2014-03-28 LAB — GLUCOSE, CAPILLARY
GLUCOSE-CAPILLARY: 171 mg/dL — AB (ref 70–99)
GLUCOSE-CAPILLARY: 145 mg/dL — AB (ref 70–99)
Glucose-Capillary: 59 mg/dL — ABNORMAL LOW (ref 70–99)

## 2014-04-25 ENCOUNTER — Encounter (HOSPITAL_COMMUNITY): Payer: Self-pay | Admitting: Orthopedic Surgery

## 2014-07-17 ENCOUNTER — Other Ambulatory Visit (HOSPITAL_COMMUNITY): Payer: Self-pay | Admitting: Orthopedic Surgery

## 2014-07-18 MED ORDER — CEFAZOLIN SODIUM-DEXTROSE 2-3 GM-% IV SOLR
2.0000 g | INTRAVENOUS | Status: AC
Start: 2014-07-19 — End: 2014-07-19
  Administered 2014-07-19: 2 g via INTRAVENOUS
  Filled 2014-07-18: qty 50

## 2014-07-18 NOTE — Progress Notes (Signed)
LVM for pt to arrive at 3:00 PM

## 2014-07-19 ENCOUNTER — Ambulatory Visit (HOSPITAL_COMMUNITY): Payer: Medicaid Other | Admitting: Anesthesiology

## 2014-07-19 ENCOUNTER — Encounter (HOSPITAL_COMMUNITY)
Admission: RE | Disposition: A | Discharge status: Home / Self Care | Payer: Self-pay | Source: Ambulatory Visit | Attending: Orthopedic Surgery

## 2014-07-19 ENCOUNTER — Encounter (HOSPITAL_COMMUNITY): Payer: Self-pay | Admitting: *Deleted

## 2014-07-19 ENCOUNTER — Ambulatory Visit (HOSPITAL_COMMUNITY)
Admission: RE | Admit: 2014-07-19 | Discharge: 2014-07-19 | Disposition: A | Discharge status: Home / Self Care | Payer: Medicaid Other | Source: Ambulatory Visit | Attending: Orthopedic Surgery | Admitting: Orthopedic Surgery

## 2014-07-19 DIAGNOSIS — I5033 Acute on chronic diastolic (congestive) heart failure: Secondary | ICD-10-CM | POA: Diagnosis not present

## 2014-07-19 DIAGNOSIS — D696 Thrombocytopenia, unspecified: Secondary | ICD-10-CM | POA: Diagnosis not present

## 2014-07-19 DIAGNOSIS — Z885 Allergy status to narcotic agent status: Secondary | ICD-10-CM | POA: Insufficient documentation

## 2014-07-19 DIAGNOSIS — I129 Hypertensive chronic kidney disease with stage 1 through stage 4 chronic kidney disease, or unspecified chronic kidney disease: Secondary | ICD-10-CM | POA: Diagnosis not present

## 2014-07-19 DIAGNOSIS — Z87891 Personal history of nicotine dependence: Secondary | ICD-10-CM | POA: Insufficient documentation

## 2014-07-19 DIAGNOSIS — E785 Hyperlipidemia, unspecified: Secondary | ICD-10-CM | POA: Insufficient documentation

## 2014-07-19 DIAGNOSIS — N184 Chronic kidney disease, stage 4 (severe): Secondary | ICD-10-CM | POA: Insufficient documentation

## 2014-07-19 DIAGNOSIS — Z888 Allergy status to other drugs, medicaments and biological substances status: Secondary | ICD-10-CM | POA: Diagnosis not present

## 2014-07-19 DIAGNOSIS — T8130XA Disruption of wound, unspecified, initial encounter: Secondary | ICD-10-CM | POA: Insufficient documentation

## 2014-07-19 DIAGNOSIS — T847XXA Infection and inflammatory reaction due to other internal orthopedic prosthetic devices, implants and grafts, initial encounter: Secondary | ICD-10-CM | POA: Diagnosis not present

## 2014-07-19 DIAGNOSIS — I2510 Atherosclerotic heart disease of native coronary artery without angina pectoris: Secondary | ICD-10-CM | POA: Diagnosis not present

## 2014-07-19 DIAGNOSIS — E119 Type 2 diabetes mellitus without complications: Secondary | ICD-10-CM | POA: Diagnosis not present

## 2014-07-19 DIAGNOSIS — Y838 Other surgical procedures as the cause of abnormal reaction of the patient, or of later complication, without mention of misadventure at the time of the procedure: Secondary | ICD-10-CM | POA: Diagnosis not present

## 2014-07-19 HISTORY — PX: HARDWARE REMOVAL: SHX979

## 2014-07-19 LAB — POCT I-STAT 4, (NA,K, GLUC, HGB,HCT)
GLUCOSE: 90 mg/dL (ref 70–99)
HEMATOCRIT: 41 % (ref 36.0–46.0)
HEMOGLOBIN: 13.9 g/dL (ref 12.0–15.0)
Potassium: 3.7 mmol/L (ref 3.5–5.1)
SODIUM: 137 mmol/L (ref 135–145)

## 2014-07-19 LAB — GLUCOSE, CAPILLARY
Glucose-Capillary: 79 mg/dL (ref 70–99)
Glucose-Capillary: 88 mg/dL (ref 70–99)
Glucose-Capillary: 101 mg/dL — ABNORMAL HIGH (ref 70–99)

## 2014-07-19 LAB — APTT

## 2014-07-19 LAB — PROTIME-INR
INR: 1.29 (ref 0.00–1.49)
PROTHROMBIN TIME: 16.2 s — AB (ref 11.6–15.2)

## 2014-07-19 SURGERY — REMOVAL, HARDWARE
Anesthesia: General | Site: Leg Upper | Laterality: Right

## 2014-07-19 MED ORDER — OXYCODONE HCL 5 MG PO TABS: 5.0000 mg | ORAL_TABLET | Freq: Once | ORAL | Status: DC | PRN

## 2014-07-19 MED ORDER — ONDANSETRON HCL 4 MG/2ML IJ SOLN
INTRAMUSCULAR | Status: AC
  Filled 2014-07-19: qty 6

## 2014-07-19 MED ORDER — EPHEDRINE SULFATE 50 MG/ML IJ SOLN
INTRAMUSCULAR | Status: DC | PRN
  Administered 2014-07-19: 5 mg via INTRAVENOUS

## 2014-07-19 MED ORDER — OXYCODONE-ACETAMINOPHEN 10-325 MG PO TABS: 1.0000 | ORAL_TABLET | ORAL | Status: DC | PRN

## 2014-07-19 MED ORDER — HYDROMORPHONE HCL 1 MG/ML IJ SOLN: 0.2500 mg | INTRAMUSCULAR | Status: DC | PRN

## 2014-07-19 MED ORDER — OXYCODONE HCL 5 MG/5ML PO SOLN: 5.0000 mg | Freq: Once | ORAL | Status: DC | PRN

## 2014-07-19 MED ORDER — PROPOFOL 10 MG/ML IV BOLUS
INTRAVENOUS | Status: AC
  Filled 2014-07-19: qty 20

## 2014-07-19 MED ORDER — FENTANYL CITRATE 0.05 MG/ML IJ SOLN
INTRAMUSCULAR | Status: DC | PRN
  Administered 2014-07-19: 25 ug via INTRAVENOUS
  Administered 2014-07-19: 50 ug via INTRAVENOUS

## 2014-07-19 MED ORDER — MIDAZOLAM HCL 5 MG/5ML IJ SOLN
INTRAMUSCULAR | Status: DC | PRN
  Administered 2014-07-19: 2 mg via INTRAVENOUS

## 2014-07-19 MED ORDER — 0.9 % SODIUM CHLORIDE (POUR BTL) OPTIME
TOPICAL | Status: DC | PRN
  Administered 2014-07-19: 1000 mL

## 2014-07-19 MED ORDER — SUCCINYLCHOLINE CHLORIDE 20 MG/ML IJ SOLN
INTRAMUSCULAR | Status: DC | PRN
  Administered 2014-07-19: 100 mg via INTRAVENOUS

## 2014-07-19 MED ORDER — FENTANYL CITRATE 0.05 MG/ML IJ SOLN
INTRAMUSCULAR | Status: AC
  Filled 2014-07-19: qty 5

## 2014-07-19 MED ORDER — PROPOFOL 10 MG/ML IV BOLUS
INTRAVENOUS | Status: DC | PRN
  Administered 2014-07-19: 120 mg via INTRAVENOUS

## 2014-07-19 MED ORDER — MIDAZOLAM HCL 2 MG/2ML IJ SOLN
INTRAMUSCULAR | Status: AC
  Filled 2014-07-19: qty 2

## 2014-07-19 MED ORDER — LIDOCAINE HCL (CARDIAC) 20 MG/ML IV SOLN
INTRAVENOUS | Status: DC | PRN
  Administered 2014-07-19: 60 mg via INTRAVENOUS

## 2014-07-19 MED ORDER — PROMETHAZINE HCL 25 MG/ML IJ SOLN: 6.2500 mg | INTRAMUSCULAR | Status: DC | PRN

## 2014-07-19 MED ORDER — ONDANSETRON HCL 4 MG/2ML IJ SOLN
INTRAMUSCULAR | Status: DC | PRN
  Administered 2014-07-19: 4 mg via INTRAVENOUS

## 2014-07-19 SURGICAL SUPPLY — 51 items
BANDAGE ESMARK 6X9 LF (GAUZE/BANDAGES/DRESSINGS) IMPLANT
BNDG CMPR 9X6 STRL LF SNTH (GAUZE/BANDAGES/DRESSINGS)
BNDG COHESIVE 4X5 TAN STRL (GAUZE/BANDAGES/DRESSINGS) IMPLANT
BNDG COHESIVE 6X5 TAN STRL LF (GAUZE/BANDAGES/DRESSINGS) ×2 IMPLANT
BNDG ESMARK 6X9 LF (GAUZE/BANDAGES/DRESSINGS)
BNDG GAUZE ELAST 4 BULKY (GAUZE/BANDAGES/DRESSINGS) ×3 IMPLANT
COVER SURGICAL LIGHT HANDLE (MISCELLANEOUS) ×3 IMPLANT
CUFF TOURNIQUET SINGLE 34IN LL (TOURNIQUET CUFF) IMPLANT
CUFF TOURNIQUET SINGLE 44IN (TOURNIQUET CUFF) IMPLANT
DRAPE C-ARM 42X72 X-RAY (DRAPES) IMPLANT
DRAPE INCISE IOBAN 66X45 STRL (DRAPES) IMPLANT
DRAPE ORTHO SPLIT 77X108 STRL (DRAPES)
DRAPE SURG ORHT 6 SPLT 77X108 (DRAPES) IMPLANT
DRAPE U-SHAPE 47X51 STRL (DRAPES) ×3 IMPLANT
DRSG ADAPTIC 3X8 NADH LF (GAUZE/BANDAGES/DRESSINGS) ×4 IMPLANT
DRSG EMULSION OIL 3X3 NADH (GAUZE/BANDAGES/DRESSINGS) ×1 IMPLANT
DRSG PAD ABDOMINAL 8X10 ST (GAUZE/BANDAGES/DRESSINGS) ×3 IMPLANT
DURAPREP 26ML APPLICATOR (WOUND CARE) ×3 IMPLANT
ELECT REM PT RETURN 9FT ADLT (ELECTROSURGICAL) ×3
ELECTRODE REM PT RTRN 9FT ADLT (ELECTROSURGICAL) ×1 IMPLANT
GAUZE SPONGE 4X4 12PLY STRL (GAUZE/BANDAGES/DRESSINGS) ×1 IMPLANT
GLOVE BIOGEL PI IND STRL 7.0 (GLOVE) IMPLANT
GLOVE BIOGEL PI IND STRL 9 (GLOVE) ×1 IMPLANT
GLOVE BIOGEL PI INDICATOR 7.0 (GLOVE) ×4
GLOVE BIOGEL PI INDICATOR 9 (GLOVE) ×2
GLOVE ECLIPSE 7.0 STRL STRAW (GLOVE) ×2 IMPLANT
GLOVE SURG ORTHO 9.0 STRL STRW (GLOVE) ×5 IMPLANT
GOWN STRL REUS W/ TWL LRG LVL3 (GOWN DISPOSABLE) IMPLANT
GOWN STRL REUS W/ TWL XL LVL3 (GOWN DISPOSABLE) ×3 IMPLANT
GOWN STRL REUS W/TWL LRG LVL3 (GOWN DISPOSABLE) ×6
GOWN STRL REUS W/TWL XL LVL3 (GOWN DISPOSABLE) ×3
KIT BASIN OR (CUSTOM PROCEDURE TRAY) ×3 IMPLANT
KIT ROOM TURNOVER OR (KITS) ×3 IMPLANT
MANIFOLD NEPTUNE II (INSTRUMENTS) ×1 IMPLANT
NS IRRIG 1000ML POUR BTL (IV SOLUTION) ×3 IMPLANT
PACK ORTHO EXTREMITY (CUSTOM PROCEDURE TRAY) ×3 IMPLANT
PAD ARMBOARD 7.5X6 YLW CONV (MISCELLANEOUS) ×6 IMPLANT
SPONGE GAUZE 4X4 12PLY STER LF (GAUZE/BANDAGES/DRESSINGS) ×4 IMPLANT
SPONGE LAP 18X18 X RAY DECT (DISPOSABLE) ×4 IMPLANT
STAPLER VISISTAT 35W (STAPLE) IMPLANT
STOCKINETTE IMPERVIOUS 9X36 MD (GAUZE/BANDAGES/DRESSINGS) IMPLANT
SUT ETHILON 2 0 FS 18 (SUTURE) ×2 IMPLANT
SUT ETHILON 2 0 PSLX (SUTURE) ×2 IMPLANT
SUT VIC AB 0 CT1 27 (SUTURE) ×3
SUT VIC AB 0 CT1 27XBRD ANBCTR (SUTURE) IMPLANT
SUT VIC AB 2-0 CT1 27 (SUTURE)
SUT VIC AB 2-0 CT1 TAPERPNT 27 (SUTURE) IMPLANT
TOWEL OR 17X24 6PK STRL BLUE (TOWEL DISPOSABLE) ×3 IMPLANT
TOWEL OR 17X26 10 PK STRL BLUE (TOWEL DISPOSABLE) ×3 IMPLANT
UNDERPAD 30X30 INCONTINENT (UNDERPADS AND DIAPERS) ×1 IMPLANT
WATER STERILE IRR 1000ML POUR (IV SOLUTION) ×1 IMPLANT

## 2014-07-19 NOTE — Transfer of Care (Signed)
Immediate Anesthesia Transfer of Care Note  Patient: Brandi Arellano  Procedure(s) Performed: Procedure(s) with comments: Removal Deep Hardware Right Femur (Right) - Six screws and one condylar plate removed   Patient Location: PACU  Anesthesia Type:General  Level of Consciousness: awake, alert , oriented and patient cooperative  Airway & Oxygen Therapy: Patient Spontanous Breathing and Patient connected to nasal cannula oxygen  Post-op Assessment: Report given to RN, Post -op Vital signs reviewed and stable and Patient moving all extremities  Post vital signs: Reviewed and stable  Last Vitals:  Filed Vitals:   07/19/14 1333  BP: 158/107  Pulse: 85  Temp: 36.5 C  Resp: 18    Complications: No apparent anesthesia complications

## 2014-07-19 NOTE — Op Note (Signed)
07/19/2014  7:29 PM  PATIENT:  Brandi Arellano    PRE-OPERATIVE DIAGNOSIS:  Infected Deep Retained Hardware Right Femur  POST-OPERATIVE DIAGNOSIS:  Same  PROCEDURE:  Removal Deep Hardware Right Femu Resection of the distal 2 cm of the femur  SURGEON:  Newt Minion, MD  PHYSICIAN ASSISTANT:None ANESTHESIA:   General  PREOPERATIVE INDICATIONS:  Brandi Arellano is a  59 y.o. female with a diagnosis of Infected Deep Retained Hardware Right Femur who failed conservative measures and elected for surgical management.    The risks benefits and alternatives were discussed with the patient preoperatively including but not limited to the risks of infection, bleeding, nerve injury, cardiopulmonary complications, the need for revision surgery, among others, and the patient was willing to proceed.  OPERATIVE IMPLANTS: Removed a 58 year old dynamic hip screw at the distal femur  OPERATIVE FINDINGS: No abscess  OPERATIVE PROCEDURE: Patient was brought to the operating room and underwent a general anesthetic. After adequate levels of anesthesia were obtained patient's right lower extremity was prepped using DuraPrep draped into a sterile field. A timeout was called. A lateral incision was made over the hardware. The screws were removed and the plate and barrel were removed. The distal 2 cm of the distal femur were then resected back to healthy viable tissue. The wound was irrigated with normal saline. The fascia was closed using #1 Vicryl skin was closed using staples. A sterile compressive dressing was applied. Patient was extubated taken to the PACU in stable condition.

## 2014-07-19 NOTE — Anesthesia Procedure Notes (Signed)
Procedure Name: Intubation Date/Time: 07/19/2014 5:45 PM Performed by: Merrilyn Puma B Pre-anesthesia Checklist: Patient identified, Timeout performed, Emergency Drugs available, Suction available and Patient being monitored Patient Re-evaluated:Patient Re-evaluated prior to inductionOxygen Delivery Method: Circle system utilized Preoxygenation: Pre-oxygenation with 100% oxygen Intubation Type: IV induction Ventilation: Mask ventilation without difficulty Laryngoscope Size: Mac and 3 Grade View: Grade I Tube type: Oral Tube size: 7.0 mm Number of attempts: 1 Airway Equipment and Method: Stylet Placement Confirmation: CO2 detector,  positive ETCO2,  ETT inserted through vocal cords under direct vision and breath sounds checked- equal and bilateral Secured at: 21 cm Tube secured with: Tape Dental Injury: Teeth and Oropharynx as per pre-operative assessment

## 2014-07-19 NOTE — H&P (Signed)
Brandi Arellano is an 60 y.o. female.   Chief Complaint: Wound dehiscence right above-the-knee amputation with exposed deep hardware HPI: Patient is a 59 year old woman who is status post open reduction internal fixation right distal femur proximally 30 years ago. Patient is most recently status post a above-the-knee amputation on the right. Patient has had progressive skin breakdown and now has exposed hardware. Patient presents at this time for removal of deep retained hardware.  Past Medical History  Diagnosis Date  . CAD (coronary artery disease)   . Hypertension   . Diabetes mellitus without complication     Type II  . CKD (chronic kidney disease) stage 4, GFR 15-29 ml/min   . Hyperlipidemia   . CHF (congestive heart failure)     Acute on chronic diastolic  . UTI (lower urinary tract infection)   . Thyroid disease     Abnormal Thyroid function Test  . Anemia   . DVT (deep venous thrombosis)   . Thrombocytopenia   . Gangrene of lower extremity     RLE    Past Surgical History  Procedure Laterality Date  . Coronary artery bypass graft    . Cholecystectomy    . Carotid endarterectomy      Bilateral  . Foot amputation Bilateral   . Amputation Right 02/01/2014    Procedure: Right Below Knee Amputation;  Surgeon: Newt Minion, MD;  Location: [facility];  Service: Orthopedics;  Laterality: Right;  . Lower extremity angiogram N/A 12/26/2013    Procedure: LOWER EXTREMITY ANGIOGRAM;  Surgeon: Serafina Bajaj, MD;  Location: [facility] CATH LAB;  Service: Cardiovascular;  Laterality: N/A;  . Abdominal angiogram  12/26/2013    Procedure: ABDOMINAL ANGIOGRAM;  Surgeon: Serafina Patchell, MD;  Location: [facility] CATH LAB;  Service: Cardiovascular;;  . Upper extremity angiogram Right 12/26/2013    Procedure: UPPER EXTREMITY ANGIOGRAM;  Surgeon: Serafina Wallen, MD;  Location: [facility] CATH LAB;  Service: Cardiovascular;  Laterality: Right;  . Arch aortogram  12/26/2013    Procedure: ARCH AORTOGRAM;  Surgeon: Serafina Mederos, MD;  Location: [facility] CATH LAB;  Service: Cardiovascular;;  . Amputation Right 03/24/2014    Procedure: AMPUTATION ABOVE KNEE;  Surgeon: Newt Minion, MD;  Location: [place];  Service: Orthopedics;  Laterality: Right;    Family History  Problem Relation Age of Onset  . Adopted: Yes  . Hypertension Father   . Heart disease Father     Coronary Artery Disease   Social History:  reports that she has quit smoking. Her smoking use included Cigarettes. She quit after 15 years of use. She has never used smokeless tobacco. She reports that she does not drink alcohol or use illicit drugs.  Allergies:  Allergies  Allergen Reactions  . Diphenhydramine Nausea And Vomiting  . Tramadol Hcl Nausea And Vomiting  . Atenolol Other (See Comments)    unknown  . Iodinated Diagnostic Agents Other (See Comments)    unknown  . Metoprolol Other (See Comments)    unknown  . Morphine And Related Other (See Comments)    unknown  . Thiopental Other (See Comments)    Other     No prescriptions prior to admission    No results found for this or any previous visit (from the past 48 hour(s)). No results found.  Review of Systems  All other systems reviewed and are negative.   There were no vitals taken for this visit. Physical Exam  On examination patient has exposed hardware with  a dynamic compression screw plate lateral femur. Assessment/Plan Assessment: Exposed dynamic compression plate lateral right femur.  Plan: We will plan for removal the deep retained hardware. Discussed the risks and benefits of surgery with the largest risk being difficulty of removing the plate due to the fact that it is about 58 years old.  Bronda Alfred V 07/19/2014, 6:11 AM

## 2014-07-19 NOTE — Anesthesia Preprocedure Evaluation (Addendum)
Anesthesia Evaluation  Patient identified by MRN, date of birth, ID band Patient awake    Reviewed: Allergy & Precautions, H&P , NPO status , Patient's Chart, lab work & pertinent test results  Airway Mallampati: II  TM Distance: >3 FB Neck ROM: Full    Dental no notable dental hx. (+) Edentulous Upper, Partial Lower, Dental Advisory Given   Pulmonary neg pulmonary ROS, former smoker,  breath sounds clear to auscultation  Pulmonary exam normal       Cardiovascular hypertension, Pt. on medications + CAD, + CABG and +CHF Rhythm:Regular Rate:Normal + Systolic murmurs    Neuro/Psych negative neurological ROS  negative psych ROS   GI/Hepatic negative GI ROS, Neg liver ROS,   Endo/Other  diabetes, Insulin DependentHypothyroidism   Renal/GU CRF and DialysisRenal disease  negative genitourinary   Musculoskeletal   Abdominal   Peds  Hematology negative hematology ROS (+)   Anesthesia Other Findings   Reproductive/Obstetrics negative OB ROS                            Anesthesia Physical  Anesthesia Plan  ASA: III  Anesthesia Plan: General   Post-op Pain Management:    Induction: Intravenous  Airway Management Planned: Oral ETT and LMA  Additional Equipment:   Intra-op Plan:   Post-operative Plan: Extubation in OR  Informed Consent: I have reviewed the patients History and Physical, chart, labs and discussed the procedure including the risks, benefits and alternatives for the proposed anesthesia with the patient or authorized representative who has indicated his/her understanding and acceptance.   Dental advisory given  Plan Discussed with: CRNA  Anesthesia Plan Comments:        Anesthesia Quick Evaluation

## 2014-07-20 NOTE — Anesthesia Postprocedure Evaluation (Signed)
  Anesthesia Post-op Note  Patient: Brandi Arellano  Procedure(s) Performed: Procedure(s) with comments: Removal Deep Hardware Right Femur (Right) - Six screws and one condylar plate removed   Patient Location: PACU  Anesthesia Type:General  Level of Consciousness: awake, alert  and oriented  Airway and Oxygen Therapy: Patient Spontanous Breathing and Patient connected to nasal cannula oxygen  Post-op Pain: mild  Post-op Assessment: Post-op Vital signs reviewed, Patient's Cardiovascular Status Stable, Respiratory Function Stable, Patent Airway and Pain level controlled  Post-op Vital Signs: stable  Last Vitals:  Filed Vitals:   07/19/14 1934  BP:   Pulse:   Temp: 36.7 C  Resp:     Complications: No apparent anesthesia complications

## 2014-07-23 ENCOUNTER — Encounter (HOSPITAL_COMMUNITY): Payer: Self-pay | Admitting: Orthopedic Surgery

## 2015-02-25 ENCOUNTER — Non-Acute Institutional Stay (SKILLED_NURSING_FACILITY): Payer: Medicaid Other | Admitting: Internal Medicine

## 2015-02-25 DIAGNOSIS — N185 Chronic kidney disease, stage 5: Secondary | ICD-10-CM | POA: Diagnosis not present

## 2015-02-25 DIAGNOSIS — Z89611 Acquired absence of right leg above knee: Secondary | ICD-10-CM | POA: Diagnosis not present

## 2015-02-25 DIAGNOSIS — E1121 Type 2 diabetes mellitus with diabetic nephropathy: Secondary | ICD-10-CM | POA: Diagnosis not present

## 2015-02-25 DIAGNOSIS — E785 Hyperlipidemia, unspecified: Secondary | ICD-10-CM

## 2015-02-25 DIAGNOSIS — Z794 Long term (current) use of insulin: Secondary | ICD-10-CM

## 2015-02-25 DIAGNOSIS — E1151 Type 2 diabetes mellitus with diabetic peripheral angiopathy without gangrene: Secondary | ICD-10-CM | POA: Diagnosis not present

## 2015-02-25 DIAGNOSIS — I10 Essential (primary) hypertension: Secondary | ICD-10-CM

## 2015-02-25 NOTE — Progress Notes (Signed)
Patient ID: Brandi Arellano, female   DOB: 1955-11-05, 59 y.o.   MRN: [id number]     Facility; [facility] Chief complaint; admission to SNF post transfer from another nursing home [facility] and rehabilitation on 02/21/15  History; this is a medically complex and to be 59 year old woman who transferred to this nursing home to be closer to family. The patient tells me that her brother is her primary caregiver and he is in the process of trying to obtain a house that they can both move into in January. She has type 2 diabetes with probable many complications and is on insulin. Currently she tells me her blood sugars are actually low. She has been on pills at one point in time in firming that this is actually type II. She has been on dialysis since 2014. She tells me that vascular access for dialysis is been problematic and she appears to have a right subclavian catheter.  A lot of her underlying medical care including surgeries was done at [facility]. 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 Lajoyce Corners. Subsequent only she didn't have healing of the stump due to migration of hardware and had hardware removal by Dr. Lajoyce Corners as well. She did obtain a prosthesis which she has in her room. Beyond that she has had coronary artery bypass graft, bilateral carotid endarterectomies, left trans-met amputation all of these done at [facility].  Past Medical History  Diagnosis Date  . CAD (coronary artery disease)   . Hypertension   . Diabetes mellitus without complication     Type II  . CKD (chronic kidney disease) stage 4, GFR 15-29 ml/min   . Hyperlipidemia   . CHF (congestive heart failure)     Acute on chronic diastolic  . UTI (lower urinary tract infection)   . Thyroid disease     Abnormal Thyroid function Test  . Anemia   . DVT (deep venous thrombosis)   . Thrombocytopenia   . Gangrene of lower extremity     RLE   Past Surgical History  Procedure Laterality Date  .  Coronary artery bypass graft    . Cholecystectomy    . Carotid endarterectomy      Bilateral  . Foot amputation Bilateral   . Amputation Right 02/01/2014    Procedure: Right Below Knee Amputation;  Surgeon: Nadara Mustard, MD;  Location: [facility] OR;  Service: Orthopedics;  Laterality: Right;  . Lower extremity angiogram N/A 12/26/2013    Procedure: LOWER EXTREMITY ANGIOGRAM;  Surgeon: Nada Libman, MD;  Location: [facility] CATH LAB;  Service: Cardiovascular;  Laterality: N/A;  . Abdominal angiogram  12/26/2013    Procedure: ABDOMINAL ANGIOGRAM;  Surgeon: Nada Libman, MD;  Location: [facility] CATH LAB;  Service: Cardiovascular;;  . Upper extremity angiogram Right 12/26/2013    Procedure: UPPER EXTREMITY ANGIOGRAM;  Surgeon: Nada Libman, MD;  Location: [facility] CATH LAB;  Service: Cardiovascular;  Laterality: Right;  . Arch aortogram  12/26/2013    Procedure: ARCH AORTOGRAM;  Surgeon: Nada Libman, MD;  Location: [facility] CATH LAB;  Service: Cardiovascular;;  . Amputation Right 03/24/2014    Procedure: AMPUTATION ABOVE KNEE;  Surgeon: Nadara Mustard, MD;  Location: [facility] OR;  Service: Orthopedics;  Laterality: Right;  . Hardware removal Right 07/19/2014    Procedure: Removal Deep Hardware Right Femur;  Surgeon: Nadara Mustard, MD;  Location: [facility] OR;  Service: Orthopedics;  Laterality: Right;  Six screws and one condylar plate removed  Current medications Atorvastatin 40 daily at bedtime Loratadine 10 mg by mouth daily when necessary Montelukast 10 mg by mouth daily Norvasc 5 daily Omeprazole 20 daily Synthroid 50 daily ASA 81 mg by mouth twice a day Cromolyn sodium ophthalmic 2 drops both eyes 4 times a day Calcium acetate 667 mg 2 by mouth before meals. Levemir 5 units subcutaneous daily at bedtime NovoLog 5 units for a CBG greater than 150 4 times daily Trazodone 25 by mouth daily at bedtime Tylenol when necessary Flexeril when necessary Zofran 4 mg every 6 hours when necessary Oxycodone 5 mg every 6 when  necessary severe pain  Social; as mentioned the patient came from another nursing home. She has no advanced directives. Her brother is her responsible party/RP although the patient appears to be able to speak for herself  reports that she has quit smoking. Her smoking use included Cigarettes. She quit after 15 years of use. She has never used smokeless tobacco. She reports that she does not drink alcohol or use illicit drugs.  Family History  Problem Relation Age of Onset  . Adopted: Yes  . Hypertension Father   . Heart disease Father     Coronary Artery Disease    Review of systems Gen. patient states she feels well HEENT states she had an optometry review well at the nursing home and [facility] and was told that she "has no problems" Respiratory no shortness of breath Cardiac no chest pain, no exertional chest pain GI no abdominal pain no change in bowel habits GU on dialysis but still voids once or twice a day Musculoskeletal no complaints of pain Vascular; states she has vascular problems in her upper extremities with possibly stents. I note that she has had aortograms up to including her thoracic aorta list and in her problem list Endocrine states she has been on pills in the past and is concerned about her low blood sugars currently Musculoskeletal; notes problems with the medial 3 fingers on her left hand Neurologic no lateralizing neurologic complaints.  Physical examination Gen. patient is up in her wheelchair pushing it in the hallway without difficulty HEENT; no lesions are seen few teeth remain Lymph nonpalpable in the cervical clavicular or axillary areas Respiratory air entry is clear bilaterally Cardiac harsh 3/6 systolic ejection murmur maximal at the aortic area she has harsh bilateral carotid bruits previously noted carotid endarterectomy scars bilaterally Chest wall subclavian catheter for dialysis previous CABG scar or what I'm presuming is a CABG scar Abdomen; no  liver no spleen no tenderness no masses GU no bladder distention Extremities; above-knee amputation on the right or stump is well-healed. She has had a trans-met amputation on the left.  Peripheral pulses are not palpable anywhere in her lower extremities there are no open areas. 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 Mental status I see no evidence of a problem here  Impression/plan #1 type 2 diabetes on insulin with likely significant peripheral vascular disease, macrovascular disease up to and including her upper extremities. 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 #4 hypothyroidism on replacement #5 question aortic stenosis. I don't think that the neck bruits are related to radiation of this however is there much louder than the underlying cardiac murmur. I suspect she may have added artery disease in addition. She does not appear to look been followed by a cardiologist will probably generate a referral. I suspect she'll need an echo at some point although she is completely asymptomatic based on a bedside assessment #6 hyperlipidemia on atorvastatin; she will need a lipid panel #7 hypertension this will need to be monitored. #8 allergic rhinitis and conjunctivitis #9 gastroesophageal reflux on omeprazole  Primary care point of view aggressive blood pressure control, diabetes control and lipid control. She is at high risk for amputation of the left leg among other macrovascular issues  cardiac and cerebrovascular.

## 2015-03-18 ENCOUNTER — Non-Acute Institutional Stay (SKILLED_NURSING_FACILITY): Payer: Medicaid Other | Admitting: Internal Medicine

## 2015-03-18 DIAGNOSIS — E1121 Type 2 diabetes mellitus with diabetic nephropathy: Secondary | ICD-10-CM | POA: Diagnosis not present

## 2015-03-18 DIAGNOSIS — N185 Chronic kidney disease, stage 5: Secondary | ICD-10-CM

## 2015-03-21 ENCOUNTER — Other Ambulatory Visit: Payer: Self-pay | Admitting: *Deleted

## 2015-03-21 MED ORDER — OXYCODONE HCL 5 MG PO TABS: ORAL_TABLET | ORAL | Status: DC

## 2015-03-21 NOTE — Telephone Encounter (Signed)
[facility] 

## 2015-03-23 NOTE — Progress Notes (Signed)
Patient ID: Brandi Arellano, female   DOB: 1955-09-20, 59 y.o.   MRN: [id number]                PROGRESS NOTE  DATE:  03/18/2015       FACILITY: [facility]                LEVEL OF CARE:   SNF   Acute Visit             CHIEF COMPLAINT:  Oral pain, review of diabetes.      HISTORY OF PRESENT ILLNESS:  This is a medically complex, but functional, 59 year-old woman who transferred from a nursing home in [place] to be closer to her family in [place].    She is a type 2 diabetic with multiple complications including chronic renal failure, on dialysis.  She has been on dialysis since 2014.    She had a right above-knee amputation by Dr. Sharol Given and a previous left transmet amputation done in [place], [place].    She has a history of coronary artery bypass, carotid endarterectomy.    She has done reasonably well here.  She is very independent in the wheelchair.  She is hoping to return home to live with a brother who is making modifications to the house, according to the patient.    She has started to complain of oral pain and loosening of her teeth in the right lower jaw.    I have also taken the opportunity to review her diabetes.  She is on a very small dose of Levemir 5 U at night as well as a loose NovoLog sliding scale of 5 U when her CBG is greater than 150 four times daily.  Her hemoglobin A1c that I did in mid November was 6.8.       PAST MEDICAL HISTORY/PROBLEM LIST:  Reviewed.     PAST SURGICAL HISTORY:   Reviewed.      CURRENT MEDICATIONS:  Medication list is reviewed.    REVIEW OF SYSTEMS:    HEENT:   She is having oral pain, as noted, with loosening of her teeth.  She tells me that she has an upper denture plate and a partial lower plate.  However, she did not bring them to the nursing home in fear that they would be lost.   CHEST/RESPIRATORY:  The patient is not short of breath.      CARDIAC:  No chest pain.   GI:  No abdominal pain.   No change in bowel habits.     PHYSICAL EXAMINATION:   VITAL SIGNS:     TEMPERATURE:  97.5.   RESPIRATIONS:  18.   BLOOD PRESSURE:  94/42.     WEIGHT:  137 pounds.   HEENT:   MOUTH/THROAT:  She has decayed teeth, very loose in the front lower jaw.  This is going to need to be removed.  I will arrange for a dental appointment.   CHEST/RESPIRATORY:  Clear air entry bilaterally.    CARDIOVASCULAR:   CARDIAC:  She has a 3/6 systolic ejection murmur, maximal at the aortic area.  Bilateral carotid bruits which are really quite loud.  There are no signs of heart failure.    ASSESSMENT/PLAN:                 Type 2 diabetes with nephropathy.   I am wondering whether this lady really needs insulin at this point.  I am going to stop the Levemir, maintain the loose  sliding scale NovoLog, start her on Amaryl.  Blood sugars are sometimes in the high 80s on the Levemir, controlled the rest of the day.     CPT CODE: 13086

## 2015-04-01 ENCOUNTER — Non-Acute Institutional Stay (SKILLED_NURSING_FACILITY): Payer: Medicaid Other | Admitting: Internal Medicine

## 2015-04-01 DIAGNOSIS — N185 Chronic kidney disease, stage 5: Secondary | ICD-10-CM

## 2015-04-01 DIAGNOSIS — E1121 Type 2 diabetes mellitus with diabetic nephropathy: Secondary | ICD-10-CM

## 2015-04-01 DIAGNOSIS — R300 Dysuria: Secondary | ICD-10-CM

## 2015-04-07 NOTE — Progress Notes (Addendum)
Patient ID: Brandi Arellano, female   DOB: 1956-02-23, 59 y.o.   MRN: [id number]                PROGRESS NOTE  DATE:  04/01/2015          FACILITY: [facility]                    LEVEL OF CARE:   SNF   Acute Visit              CHIEF COMPLAINT:  Dysuria, diabetes, etc.    HISTORY OF PRESENT ILLNESS:  This is a medically complex, but functional woman who came to [place] from a nursing home in [place] to be closer to family.    She is a type 2 diabetic with multiple complications including chronic renal failure, on dialysis since 2014.  When she came here, she was on a small dose of Levemir 5 U at h.s. as well as a loose NovoLog sliding scale of 5 U when her CBG was greater than 150.  Hemoglobin A1c that I did in mid November was 6.8.  I discontinued the Levemir a few weeks ago.  I put her on Amaryl.  Her blood sugars on 03/30/2015 were 120, 186, 252, 141.  On 03/31/2015:  120, 164, 122, 268.    The patient, strangely enough, states she still voids every two hours.  She thinks she makes a decent amount of urine.  She has talked to her nephrologist about this.  However, she has started to notice some dysuria.    PAST MEDICAL HISTORY/PROBLEM LIST:  Past Medical History  Diagnosis Date  . CAD (coronary artery disease)   . Hypertension   . Diabetes mellitus without complication     Type II  . CKD (chronic kidney disease) stage 4, GFR 15-29 ml/min   . Hyperlipidemia   . CHF (congestive heart failure)     Acute on chronic diastolic  . UTI (lower urinary tract infection)   . Thyroid disease     Abnormal Thyroid function Test  . Anemia   . DVT (deep venous thrombosis)   . Thrombocytopenia   . Gangrene of lower extremity     RLE       PAST SURGICAL HISTORY:   Past Surgical History  Procedure Laterality Date  . Coronary artery bypass graft    . Cholecystectomy    . Carotid endarterectomy      Bilateral  . Foot amputation Bilateral   . Amputation Right 02/01/2014    Procedure:  Right Below Knee Amputation;  Surgeon: Newt Minion, MD;  Location: [place];  Service: Orthopedics;  Laterality: Right;  . Lower extremity angiogram N/A 12/26/2013    Procedure: LOWER EXTREMITY ANGIOGRAM;  Surgeon: Serafina Monjaraz, MD;  Location: [facility] CATH LAB;  Service: Cardiovascular;  Laterality: N/A;  . Abdominal angiogram  12/26/2013    Procedure: ABDOMINAL ANGIOGRAM;  Surgeon: Serafina Bunn, MD;  Location: [facility] CATH LAB;  Service: Cardiovascular;;  . Upper extremity angiogram Right 12/26/2013    Procedure: UPPER EXTREMITY ANGIOGRAM;  Surgeon: Serafina Coriz, MD;  Location: [facility] CATH LAB;  Service: Cardiovascular;  Laterality: Right;  . Arch aortogram  12/26/2013    Procedure: ARCH AORTOGRAM;  Surgeon: Serafina Jamison, MD;  Location[facility] CATH LAB;  Service: Cardiovascular;;  . Amputation Right 03/24/2014    Procedure: AMPUTATION ABOVE KNEE;  Surgeon: Newt Minion, MD;  Location: [place];  Service: Orthopedics;  Laterality: Right;  .  Hardware removal Right 07/19/2014    Procedure: Removal Deep Hardware Right Femur;  Surgeon: Newt Minion, MD;  Location: [place];  Service: Orthopedics;  Laterality: Right;  Six screws and one condylar plate removed        CURRENT MEDICATIONS:  Medication list is reviewed.    Singulair 10 q.d.      Norvasc 5 q.d.     Prilosec 20 q.d.     Synthroid 50 q.d.     Enteric-coated aspirin 81 q.d.     Calcium acetate 2 capsules/334 mg by mouth three times a day before meals.    PHYSICAL EXAMINATION:   VITAL SIGNS:     TEMPERATURE:  98.2.    PULSE:  57.   RESPIRATIONS:  17.   BLOOD PRESSURE:  130/78.   GENERAL APPEARANCE:  The patient does not look unwell.    CHEST/RESPIRATORY:  Clear air entry bilaterally.    CARDIOVASCULAR:   CARDIAC:  She has a grade 2/6 systolic ejection murmur at the aortic area.  She has had bilateral carotid endarterectomies.  She appears to be euvolemic.      GASTROINTESTINAL:   ABDOMEN:  Soft.  There are no masses.    GENITOURINARY:     BLADDER:  Her bladder is not distended.  There is no suprapubic or costovertebral angle tenderness.    ASSESSMENT/PLAN:                   Dysuria.  I am going to order a urine culture on her.  It is a bit unusual that she voids every two hours in the face of dialysis.     Type 2 diabetes.  I am going to increase the Amaryl to 2 mg daily.  Confidently, I think she does not require insulin at this point.     CPT CODE: 03474

## 2015-05-07 LAB — HEPATIC FUNCTION PANEL: Alkaline Phosphatase: 148 U/L — AB (ref 25–125)

## 2015-05-07 LAB — HEMOGLOBIN A1C: HEMOGLOBIN A1C: 7.4

## 2015-05-19 LAB — TSH: TSH: 5.69 u[IU]/mL (ref <=5.90)

## 2015-05-21 LAB — BASIC METABOLIC PANEL: BUN: 52 mg/dL — AB (ref 4–21)

## 2015-06-03 ENCOUNTER — Other Ambulatory Visit: Payer: Self-pay | Admitting: *Deleted

## 2015-06-03 MED ORDER — OXYCODONE HCL 5 MG PO TABS: ORAL_TABLET | ORAL | Status: DC

## 2015-06-03 NOTE — Telephone Encounter (Signed)
[facility] 

## 2015-06-04 LAB — BASIC METABOLIC PANEL
Creatinine: 6.6 mg/dL — AB (ref <=1.1)
GLUCOSE: 174 mg/dL
Potassium: 4.6 mmol/L (ref 3.4–5.3)
Sodium: 136 mmol/L — AB (ref 137–147)

## 2015-06-04 LAB — CBC AND DIFFERENTIAL
HCT: 33 % — AB (ref 36–46)
Hemoglobin: 11.2 g/dL — AB (ref 12.0–16.0)
PLATELETS: 117 10*3/uL — AB (ref 150–399)
WBC: 4.6 10*3/mL

## 2015-06-16 ENCOUNTER — Encounter: Payer: Self-pay | Admitting: Internal Medicine

## 2015-06-16 NOTE — Progress Notes (Signed)
This encounter was created in error - please disregard.

## 2015-06-18 LAB — TSH: TSH: 5.69 u[IU]/mL (ref 0.41–5.90)

## 2015-07-09 ENCOUNTER — Non-Acute Institutional Stay (SKILLED_NURSING_FACILITY): Payer: Medicaid Other | Admitting: Nurse Practitioner

## 2015-07-09 ENCOUNTER — Encounter: Payer: Self-pay | Admitting: Nurse Practitioner

## 2015-07-09 DIAGNOSIS — K3184 Gastroparesis: Secondary | ICD-10-CM | POA: Diagnosis not present

## 2015-07-09 DIAGNOSIS — N186 End stage renal disease: Secondary | ICD-10-CM

## 2015-07-09 DIAGNOSIS — E1143 Type 2 diabetes mellitus with diabetic autonomic (poly)neuropathy: Secondary | ICD-10-CM | POA: Diagnosis not present

## 2015-07-09 DIAGNOSIS — E039 Hypothyroidism, unspecified: Secondary | ICD-10-CM

## 2015-07-09 DIAGNOSIS — Z89511 Acquired absence of right leg below knee: Secondary | ICD-10-CM

## 2015-07-09 DIAGNOSIS — G4700 Insomnia, unspecified: Secondary | ICD-10-CM

## 2015-07-09 DIAGNOSIS — I10 Essential (primary) hypertension: Secondary | ICD-10-CM | POA: Diagnosis not present

## 2015-07-09 DIAGNOSIS — E1165 Type 2 diabetes mellitus with hyperglycemia: Secondary | ICD-10-CM | POA: Diagnosis not present

## 2015-07-09 DIAGNOSIS — D631 Anemia in chronic kidney disease: Secondary | ICD-10-CM

## 2015-07-09 DIAGNOSIS — N189 Chronic kidney disease, unspecified: Secondary | ICD-10-CM | POA: Diagnosis not present

## 2015-07-09 DIAGNOSIS — IMO0002 Reserved for concepts with insufficient information to code with codable children: Secondary | ICD-10-CM

## 2015-07-09 DIAGNOSIS — E1121 Type 2 diabetes mellitus with diabetic nephropathy: Secondary | ICD-10-CM | POA: Diagnosis not present

## 2015-07-09 HISTORY — DX: Insomnia, unspecified: G47.00

## 2015-07-09 NOTE — Assessment & Plan Note (Signed)
Blood pressure is controlled, continue Amlodipine 5mg qd.  

## 2015-07-09 NOTE — Assessment & Plan Note (Signed)
Last TSH 5.69 06/18/15, will start Levothyroxine 89mcg, repeat TSH in 8 week.

## 2015-07-09 NOTE — Assessment & Plan Note (Signed)
Sleep/rest with aid of Trazodone 25mg  qhs

## 2015-07-09 NOTE — Assessment & Plan Note (Signed)
06/04/15 Hgb 11.2

## 2015-07-09 NOTE — Assessment & Plan Note (Addendum)
Bun 52, creat 6.6 06/04/15, hemodialysis

## 2015-07-09 NOTE — Assessment & Plan Note (Signed)
Neuropathic pain is managed with Flexeril 5mg  q8h prn and Oxycodone 5mg  q6h prn

## 2015-07-09 NOTE — Assessment & Plan Note (Signed)
05/07/15 Hgb a1c 7.4, continue Amaryl 2mg  daily, Novolog 5u bid for CBG>150. Creat 6.6 06/04/15

## 2015-07-09 NOTE — Assessment & Plan Note (Signed)
Managed with Reglan 50mg  tid

## 2015-07-09 NOTE — Progress Notes (Signed)
Patient ID: Brandi Arellano, female   DOB: [birth year], 60 y.o.   MRN: [id number]  Location:  [facility] Room Number: 215 B Place of Service:  SNF (31) Provider: Marlana Latus NP  No primary care provider on file.  Patient Care Team: Donato Heinz, MD as Consulting Physician (Nephrology)  Extended Emergency Contact Information Primary Emergency Contact: [facility] Address: [address] [place], [place] [address] Johnnette Litter of [place] Phone: [phone] Relation: Brother Secondary Emergency Contact: Milus Height Address: [address] [place], [place] [address] Johnnette Litter of [place] Phone: [phone] Relation: Brother  Code Status:  DNR Goals of care: Advanced Directive information Advanced Directives 07/09/2015  Does patient have an advance directive? No  Would patient like information on creating an advanced directive? No - patient declined information     Chief Complaint  Patient presents with  . Medical Management of Chronic Issues    Routine Visit    HPI:  Pt is a 60 y.o. female seen today for medical management of chronic diseases.  Hx of T2DM, last Hgb A1c 7.4 05/07/15, taking Amaryl 2mg  and Novolog 5u bid for CBG.150, HTN, blood pressure is controlled while Amlodipine 5mg  daily, she takes Trazodone 25mg  qhd for sleep and rest, nausea and vomiting related to her gastroparesis is managed with Reglan 5mg  tid, pain/aches is controlled with Flexeril and Oxycodone prn.    Past Medical History  Diagnosis Date  . CAD (coronary artery disease)   . Hypertension   . Diabetes mellitus without complication (HCC)     Type II  . CKD (chronic kidney disease) stage 4, GFR 15-29 ml/min (HCC)   . Hyperlipidemia   . CHF (congestive heart failure) (HCC)     Acute on chronic diastolic  . UTI (lower urinary tract infection)   . Thyroid disease     Abnormal Thyroid function Test  . Anemia   .  DVT (deep venous thrombosis) ([place])   . Thrombocytopenia ([place])   . Gangrene of lower extremity ([place])     RLE   Past Surgical History  Procedure Laterality Date  . Coronary artery bypass graft    . Cholecystectomy    . Carotid endarterectomy      Bilateral  . Foot amputation Bilateral   . Amputation Right 02/01/2014    Procedure: Right Below Knee Amputation;  Surgeon: Newt Minion, MD;  Location: [facility];  Service: Orthopedics;  Laterality: Right;  . Lower extremity angiogram N/A 12/26/2013    Procedure: LOWER EXTREMITY ANGIOGRAM;  Surgeon: Serafina Duncombe, MD;  Location: [facility] C[facility] LAB;  Service: Cardiovascular;  Laterality: N/A;  . Abdominal angiogram  12/26/2013    Procedure: ABDOMINAL ANGIOGRAM;  Surgeon: Serafina Rodocker, MD;  Location: [facility] CATH LAB;  Service: Cardiovascular;;  . Upper extremity angiogram Right 12/26/2013    Procedure: UPPER EXTREMITY ANGIOGRAM;  Surgeon: Serafina Timmins, MD;  Location: [facility] CATH LAB;  Service: Cardiovascular;  Laterality: Right;  . Arch aortogram  12/26/2013    Procedure: ARCH AORTOGRAM;  Surgeon: Serafina Mayberry, MD;  Location: [facility] CATH LAB;  Service: Cardiovascular;;  . Amputation Right 03/24/2014    Procedure: AMPUTATION ABOVE KNEE;  Surgeon: Newt Minion, MD;  Location: [place];  Service: Orthopedics;  Laterality: Right;  . Hardware removal Right 07/19/2014    Procedure: Removal Deep Hardware Right Femur;  Surgeon:  Newt Minion, MD;  Location: [place];  Service: Orthopedics;  Laterality: Right;  Six screws and one condylar plate removed     Allergies  Allergen Reactions  . Diphenhydramine Nausea And Vomiting  . Tramadol Hcl Nausea And Vomiting  . Atenolol Other (See Comments)    unknown  . Iodinated Diagnostic Agents Other (See Comments)    unknown  . Metoprolol Other (See Comments)    unknown  . Morphine And Related Other (See Comments)    unknown  . Thiopental Other (See Comments)    Other       Medication List       This list is accurate as  of: 07/09/15 11:59 PM.  Always use your most recent med list.               acetaminophen 325 MG tablet  Commonly known as:  TYLENOL  Take 650 mg by mouth every 6 (six) hours as needed for mild pain, moderate pain or fever.     amLODipine 5 MG tablet  Commonly known as:  NORVASC  Take 5 mg by mouth daily.     aspirin EC 81 MG tablet  Take 81 mg by mouth 2 (two) times daily.     atorvastatin 40 MG tablet  Commonly known as:  LIPITOR  Take 40 mg by mouth daily.     cromolyn 4 % ophthalmic solution  Commonly known as:  OPTICROM  Place 2 drops into both eyes 4 (four) times daily.     cyclobenzaprine 5 MG tablet  Commonly known as:  FLEXERIL  Take 5 mg by mouth every 8 (eight) hours as needed for muscle spasms.     glimepiride 2 MG tablet  Commonly known as:  AMARYL  Take 2 mg by mouth daily with breakfast.     insulin aspart 100 UNIT/ML injection  Commonly known as:  novoLOG  Inject 5 Units into the skin. Check BS twice daily : If >150 inject 5 units     levothyroxine 25 MCG tablet  Commonly known as:  SYNTHROID, LEVOTHROID  Take 50 mcg by mouth daily before breakfast.     loratadine 10 MG tablet  Commonly known as:  CLARITIN  Take 10 mg by mouth daily as needed for allergies.     metoCLOPramide 5 MG tablet  Commonly known as:  REGLAN  Take 1 tablet (5 mg total) by mouth 3 (three) times daily before meals. Don't take after 02/16/14.     montelukast 10 MG tablet  Commonly known as:  SINGULAIR  Take 10 mg by mouth daily.     ondansetron 4 MG tablet  Commonly known as:  ZOFRAN  Take 4 mg by mouth every 6 (six) hours as needed for nausea or vomiting.     oxyCODONE 5 MG immediate release tablet  Commonly known as:  ROXICODONE  Take one tablet by mouth every 6 hours as needed for severe pain     PHOSLO 667 MG capsule  Generic drug:  calcium acetate  Take 2,001 mg by mouth 3 (three) times daily before meals.     pseudoephedrine 30 MG tablet  Commonly known as:   SUDAFED  Take 30 mg by mouth every 6 (six) hours as needed for congestion.     sodium chloride 0.65 % Soln nasal spray  Commonly known as:  OCEAN  Place 1 spray into both nostrils every 6 (six) hours as needed for congestion.     traZODone 50 MG  tablet  Commonly known as:  DESYREL  Take 25 mg by mouth at bedtime.        Review of Systems  Immunization History  Administered Date(s) Administered  . PPD Test 04/29/2015   Pertinent  Health Maintenance Due  Topic Date Due  . FOOT EXAM  03/06/1967  . OPHTHALMOLOGY EXAM  03/06/1967  . URINE MICROALBUMIN  03/06/1967  . PAP SMEAR  03/05/1978  . MAMMOGRAM  03/06/2007  . COLONOSCOPY  03/06/2007  . HEMOGLOBIN A1C  11/04/2015  . INFLUENZA VACCINE  11/11/2015   No flowsheet data found. Functional Status Survey:    Filed Vitals:   07/09/15 1156  BP: 124/74  Pulse: 76  Temp: 98 F (36.7 C)  TempSrc: Oral  Resp: 16   There is no weight on file to calculate BMI. Physical Exam  Labs reviewed:  Recent Labs  05/21/15 06/04/15  NA  --  136*  K  --  4.6  BUN 52*  --   CREATININE  --  6.6*    Recent Labs  05/07/15  ALKPHOS 148*    Recent Labs  06/04/15  WBC 4.6  HGB 11.2*  HCT 33*  PLT 117*   Lab Results  Component Value Date   TSH 5.69 06/18/2015   Lab Results  Component Value Date   HGBA1C 7.4 05/07/2015   No results found for: CHOL, HDL, LDLCALC, LDLDIRECT, TRIG, CHOLHDL  Significant Diagnostic Results in last 30 days:  No results found.  Assessment/Plan  DM type 2, uncontrolled, with renal complications [id number] Hgb a1c 7.4, continue Amaryl 2mg  daily, Novolog 5u bid for CBG>150. Creat 6.6 06/04/15  Hypothyroidism Last TSH 5.69 06/18/15, will start Levothyroxine 60mcg, repeat TSH in 8 week.   End stage renal disease Bun 52, creat 6.6 06/04/15, hemodialysis  Essential hypertension Blood pressure is controlled, continue Amlodipine 5mg  qd  Gastroparesis due to DM Managed with Reglan 50mg   tid  Insomnia Sleep/rest with aid of Trazodone 25mg  qhs  Anemia in chronic kidney disease 06/04/15 Hgb 11.2   S/P BKA (below knee amputation) unilateral Neuropathic pain is managed with Flexeril 5mg  q8h prn and Oxycodone 5mg  q6h prn    Family/ staff Communication: continue SNF for care needs  Labs/tests ordered:  TSH

## 2015-08-04 ENCOUNTER — Non-Acute Institutional Stay (SKILLED_NURSING_FACILITY): Payer: Medicaid Other | Admitting: Internal Medicine

## 2015-08-04 ENCOUNTER — Encounter: Payer: Self-pay | Admitting: Internal Medicine

## 2015-08-04 DIAGNOSIS — H109 Unspecified conjunctivitis: Secondary | ICD-10-CM | POA: Insufficient documentation

## 2015-08-04 DIAGNOSIS — E785 Hyperlipidemia, unspecified: Secondary | ICD-10-CM | POA: Insufficient documentation

## 2015-08-04 HISTORY — DX: Unspecified conjunctivitis: H10.9

## 2015-08-04 MED ORDER — NEOMYCIN-POLYMYXIN-HC 3.5-10000-1 OP SUSP: OPHTHALMIC | Status: DC

## 2015-08-04 NOTE — Progress Notes (Signed)
Patient ID: Brandi Arellano, female   DOB: January 07, 1956, 60 y.o.   MRN: [id number]  Location:  [place] Room Number: 215B Place of Service:  SNF (31) Provider: Estill Dooms, MD  Patient Care Team: Estill Dooms, MD as PCP - General (Internal Medicine) Donato Heinz, MD as Consulting Physician (Nephrology)  Extended Emergency Contact Information Primary Emergency Contact: [facility] Address: [address] [place], [place] [address] Johnnette Litter of [place] Phone: [phone] Relation: Brother Secondary Emergency Contact: Milus Height Address: [address] [place], [place] [address] [place] of [place] Phone: [phone] Relation: Brother  Code Status:  full Goals of care: Advanced Directive information Advanced Directives 08/04/2015  Does patient have an advance directive? No  Would patient like information on creating an advanced directive? -     Chief Complaint  Patient presents with  . Acute Visit    red, itchy eyes    HPI:  Pt is a 60 y.o. female seen today for an acute visit for Evaluation of her left eye which is red and draining clear liquids. Problem has been present 3 days. She denies pain or itching. No fever. Denies cough or chest congestion. No pharyngitis. She says that she knows she has "pinkeye".   Past Medical History  Diagnosis Date  . CAD (coronary artery disease)   . Hypertension   . Diabetes mellitus without complication (HCC)     Type II  . CKD (chronic kidney disease) stage 4, GFR 15-29 ml/min (HCC)   . Hyperlipidemia   . CHF (congestive heart failure) (HCC)     Acute on chronic diastolic  . UTI (lower urinary tract infection)   . Thyroid disease     Abnormal Thyroid function Test  . Anemia   . DVT (deep venous thrombosis) ([place])   . Thrombocytopenia ([place])   . Gangrene of lower extremity ([place])     RLE   Past Surgical History  Procedure Laterality  Date  . Coronary artery bypass graft    . Cholecystectomy    . Carotid endarterectomy      Bilateral  . Foot amputation Bilateral   . Amputation Right 02/01/2014    Procedure: Right Below Knee Amputation;  Surgeon: Newt Minion, MD;  Location: [facility];  Service: Orthopedics;  Laterality: Right;  . Lower extremity angiogram N/A 12/26/2013    Procedure: LOWER EXTREMITY ANGIOGRAM;  Surgeon: Serafina Skolnick, MD;  Location: [facility] CATH LAB;  Service: Cardiovascular;  Laterality: N/A;  . Abdominal angiogram  12/26/2013    Procedure: ABDOMINAL ANGIOGRAM;  Surgeon: Serafina Weitzel, MD;  Location: [facility] CATH LAB;  Service: Cardiovascular;;  . Upper extremity angiogram Right 12/26/2013    Procedure: UPPER EXTREMITY ANGIOGRAM;  Surgeon: Serafina Stueve, MD;  Location: [facility] CATH LAB;  Service: Cardiovascular;  Laterality: Right;  . Arch aortogram  12/26/2013    Procedure: ARCH AORTOGRAM;  Surgeon: Serafina Asfour, MD;  Location: [facility] CATH LAB;  Service: Cardiovascular;;  . Amputation Right 03/24/2014    Procedure: AMPUTATION ABOVE KNEE;  Surgeon: Newt Minion, MD;  Location: [place];  Service: Orthopedics;  Laterality: Right;  . Hardware removal Right 07/19/2014    Procedure: Removal Deep Hardware Right Femur;  Surgeon: Newt Minion, MD;  Location: [place];  Service: Orthopedics;  Laterality: Right;  Six screws and one condylar plate removed  Allergies  Allergen Reactions  . Diphenhydramine Nausea And Vomiting  . Tramadol Hcl Nausea And Vomiting  . Atenolol Other (See Comments)    unknown  . Iodinated Diagnostic Agents Other (See Comments)    unknown  . Metoprolol Other (See Comments)    unknown  . Morphine And Related Other (See Comments)    unknown  . Thiopental Other (See Comments)    Other       Medication List       This list is accurate as of: 08/04/15  4:04 PM.  Always use your most recent med list.               acetaminophen 325 MG tablet  Commonly known as:  TYLENOL  Take 650 mg by  mouth every 6 (six) hours as needed for mild pain, moderate pain or fever.     amLODipine 5 MG tablet  Commonly known as:  NORVASC  Take 5 mg by mouth daily.     aspirin EC 81 MG tablet  Take 81 mg by mouth 2 (two) times daily.     atorvastatin 40 MG tablet  Commonly known as:  LIPITOR  Take 40 mg by mouth daily.     cromolyn 4 % ophthalmic solution  Commonly known as:  OPTICROM  Place 2 drops into both eyes 4 (four) times daily.     cyclobenzaprine 5 MG tablet  Commonly known as:  FLEXERIL  Take 5 mg by mouth every 8 (eight) hours as needed for muscle spasms.     glimepiride 2 MG tablet  Commonly known as:  AMARYL  Take 2 mg by mouth daily with breakfast.     insulin aspart 100 UNIT/ML injection  Commonly known as:  novoLOG  Inject 5 Units into the skin. Check BS twice daily : If >150 inject 5 units     levothyroxine 25 MCG tablet  Commonly known as:  SYNTHROID, LEVOTHROID  Take 50 mcg by mouth daily before breakfast.     loratadine 10 MG tablet  Commonly known as:  CLARITIN  Take 10 mg by mouth daily as needed for allergies.     metoCLOPramide 5 MG tablet  Commonly known as:  REGLAN  Take 1 tablet (5 mg total) by mouth 3 (three) times daily before meals. Don't take after 02/16/14.     montelukast 10 MG tablet  Commonly known as:  SINGULAIR  Take 10 mg by mouth daily.     ondansetron 4 MG tablet  Commonly known as:  ZOFRAN  Take 4 mg by mouth every 6 (six) hours as needed for nausea or vomiting.     oxyCODONE 5 MG immediate release tablet  Commonly known as:  ROXICODONE  Take one tablet by mouth every 6 hours as needed for severe pain     PHOSLO 667 MG capsule  Generic drug:  calcium acetate  Take 2,001 mg by mouth 3 (three) times daily before meals.     pseudoephedrine 30 MG tablet  Commonly known as:  SUDAFED  Take 30 mg by mouth every 6 (six) hours as needed for congestion.     sodium chloride 0.65 % Soln nasal spray  Commonly known as:  OCEAN  Place  1 spray into both nostrils every 6 (six) hours as needed for congestion.     traZODone 50 MG tablet  Commonly known as:  DESYREL  Take 25 mg by mouth at bedtime.        Review of Systems  Constitutional: Negative for fever, chills, diaphoresis, activity change, appetite change, fatigue and unexpected weight change.  HENT: Negative for congestion, ear discharge, ear pain, hearing loss, postnasal drip, rhinorrhea, sore throat, tinnitus, trouble swallowing and voice change.        Erythema and drainage of the left eye worse than the right.  Eyes: Negative for pain, redness, itching and visual disturbance.  Respiratory: Negative for cough, choking, shortness of breath and wheezing.   Cardiovascular: Negative for chest pain, palpitations and leg swelling.       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 Bilateral carotid endarterectomies.  Gastrointestinal: Negative for nausea, abdominal pain, diarrhea, constipation and abdominal distention.       History GERD  Endocrine: Negative for cold intolerance, heat intolerance, polydipsia, polyphagia and polyuria.       Diabetes mellitus with vascular disease Hypothyroid  Genitourinary: Negative for dysuria, urgency, frequency, hematuria, flank pain, vaginal discharge, difficulty urinating and pelvic pain.       ESRD on dialysis  Musculoskeletal: Negative for myalgias, back pain, arthralgias, gait problem, neck pain and neck stiffness.       Rides wheelchair for mobility. 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 Negative for color change and rash.  Allergic/Immunologic: Negative.   Neurological: Negative for dizziness, tremors, seizures, syncope, weakness, numbness and headaches.  Hematological: Negative for adenopathy. Does not bruise/bleed easily.       Chronic anemia  Psychiatric/Behavioral: Negative for suicidal ideas,  hallucinations, behavioral problems, confusion, sleep disturbance, dysphoric mood and agitation. The patient is not nervous/anxious and is not hyperactive.     Immunization History  Administered Date(s) Administered  . PPD Test 04/29/2015   Pertinent  Health Maintenance Due  Topic Date Due  . FOOT EXAM  03/06/1967  . OPHTHALMOLOGY EXAM  03/06/1967  . URINE MICROALBUMIN  03/06/1967  . PAP SMEAR  03/05/1978  . MAMMOGRAM  03/06/2007  . COLONOSCOPY  03/06/2007  . HEMOGLOBIN A1C  11/04/2015  . INFLUENZA VACCINE  11/11/2015   No flowsheet data found. Functional Status Survey:    Filed Vitals:   08/04/15 1556  BP: 116/60  Pulse: 64  Temp: 97.4 F (36.3 C)  Resp: 18  Height: 5\' 5"  (1.651 m)  Weight: 137 lb (62.143 kg)   Body mass index is 22.8 kg/(m^2). Physical Exam  Constitutional: She is oriented to person, place, and time. She appears well-developed and well-nourished. No distress.  HENT:  Right Ear: External ear normal.  Left Ear: External ear normal.  Nose: Nose normal.  Mouth/Throat: Oropharynx is clear and moist. No oropharyngeal exudate.  Eyes: Conjunctivae and EOM are normal. Pupils are equal, round, and reactive to light. No scleral icterus.  Erythema and watery discharge bilaterally, but worse on the left side.  Neck: No JVD present. No tracheal deviation present. No thyromegaly present.  Cardiovascular: Normal rate, regular rhythm and intact distal pulses.  Exam reveals no gallop and no friction rub.   Murmur (2/6 aortic systolic ejection murmur) heard. Pulmonary/Chest: Effort normal. No respiratory distress. She has no wheezes. She has no rales. She exhibits no tenderness.  CABG scar  Abdominal: She exhibits no distension and no mass. There is no tenderness.  Musculoskeletal: Normal range of motion. She exhibits no edema or tenderness.  Dupuytren contracture of the right second third and fourth fingers. Bilateral leg amputations.  Lymphadenopathy:    She has  no cervical adenopathy.  Neurological:  She is alert and oriented to person, place, and time. No cranial nerve deficit. Coordination normal.  Skin: No rash noted. She is not diaphoretic. No erythema. No pallor.  Psychiatric: She has a normal mood and affect. Her behavior is normal. Judgment and thought content normal.    Labs reviewed:  Recent Labs  05/21/15 06/04/15  NA  --  136*  K  --  4.6  BUN 52*  --   CREATININE  --  6.6*    Recent Labs  05/07/15  ALKPHOS 148*    Recent Labs  06/04/15  WBC 4.6  HGB 11.2*  HCT 33*  PLT 117*   Lab Results  Component Value Date   TSH 5.69 06/18/2015   Lab Results  Component Value Date   HGBA1C 7.4 05/07/2015    Assessment/Plan 1. Bilateral conjunctivitis - neomycin-polymyxin-hydrocortisone (CORTISPORIN) 3.5-10000-1 ophthalmic suspension; Apply 3 drops in each eye 4 times daily for 5 days  Dispense: 7.5 mL; Refill: 0

## 2015-08-21 ENCOUNTER — Other Ambulatory Visit: Payer: Self-pay

## 2015-08-21 MED ORDER — OXYCODONE HCL 5 MG PO TABS: ORAL_TABLET | ORAL | Status: DC

## 2015-08-21 NOTE — Telephone Encounter (Signed)
Prescription request was received from:  [facility] [address] [place] [address]  Phone: [phone]  Fax: [phone]  

## 2016-01-24 IMAGING — CR DG FOOT COMPLETE 3+V*R*
3 series · 3 of 3 positions shown · non-contrast
Comparison: None.

CLINICAL DATA: 57-year-old female with previous amputation of the
right great toe. Swelling redness an ulcer on the lateral side of
the right foot.

EXAM:
RIGHT FOOT COMPLETE - 3+ VIEW

[x foot ap right]
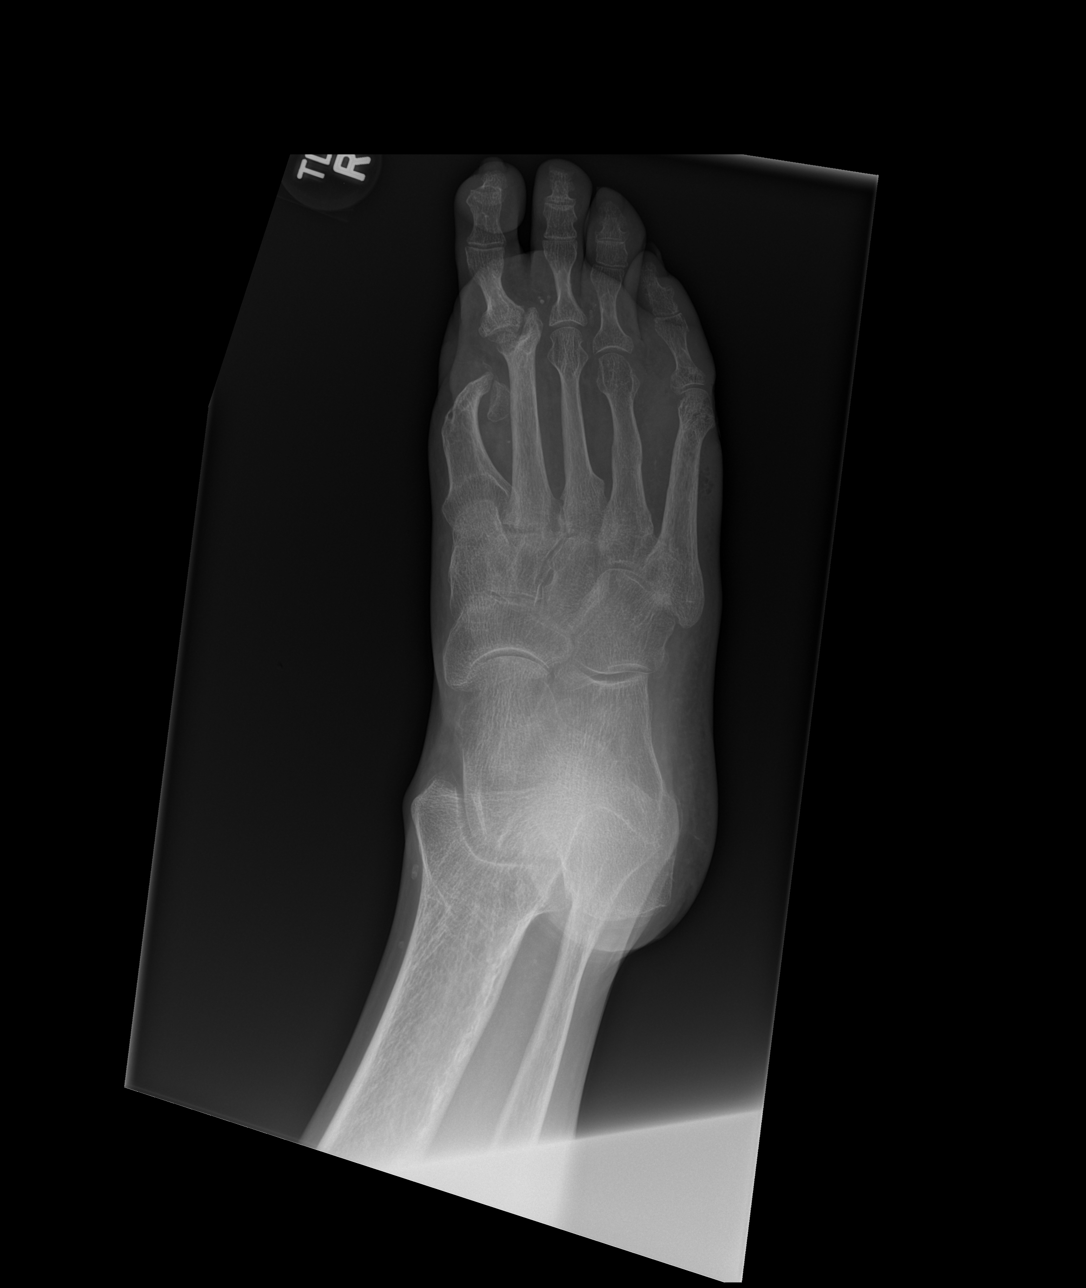

[x foot obl right]
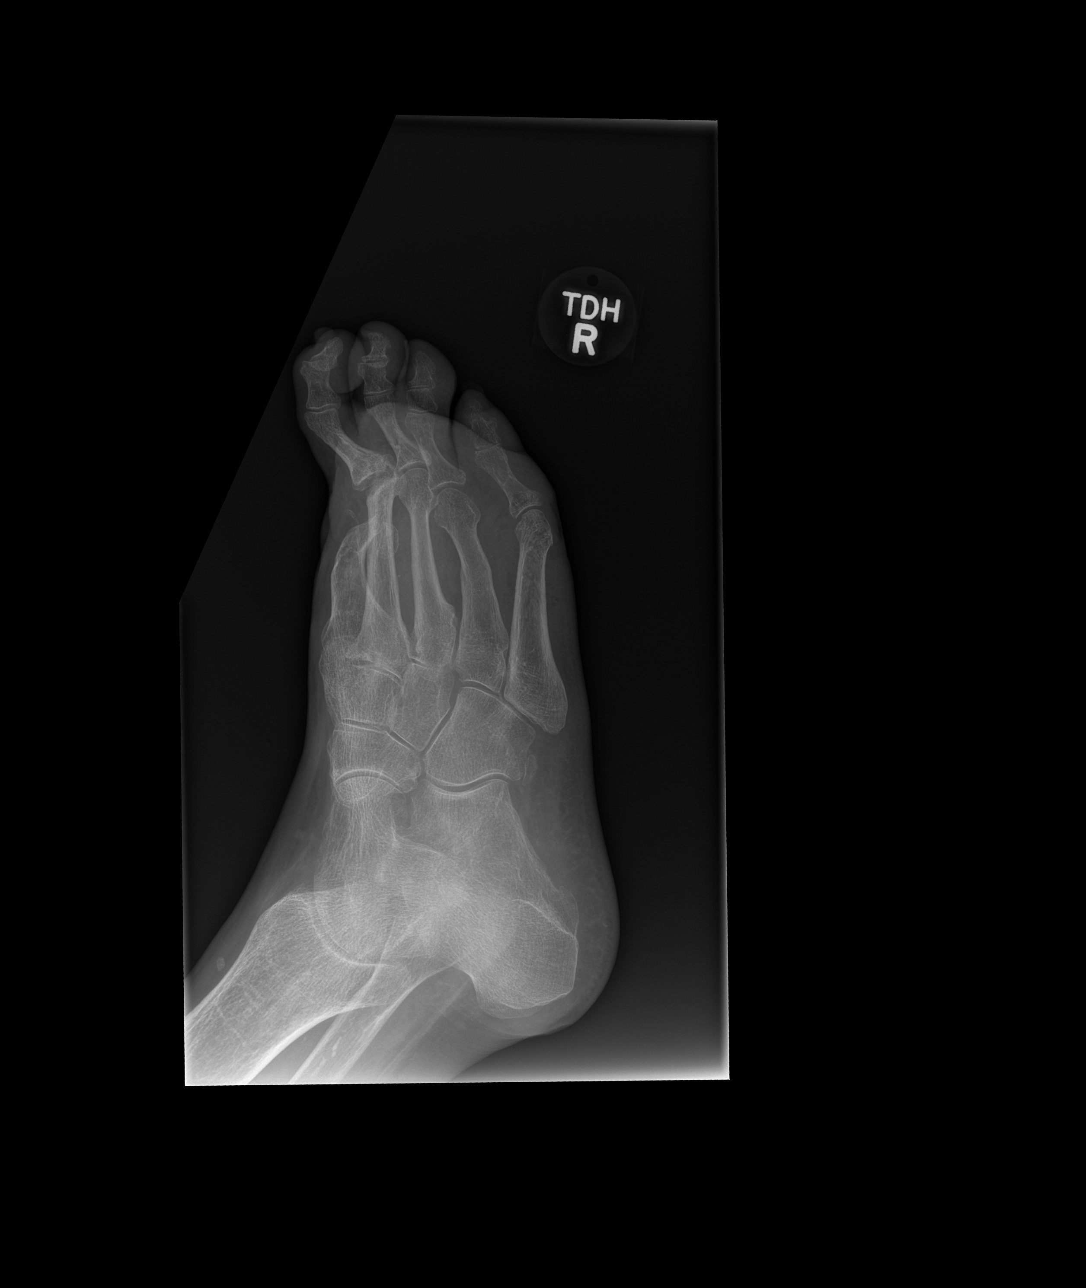

[x foot lat right]
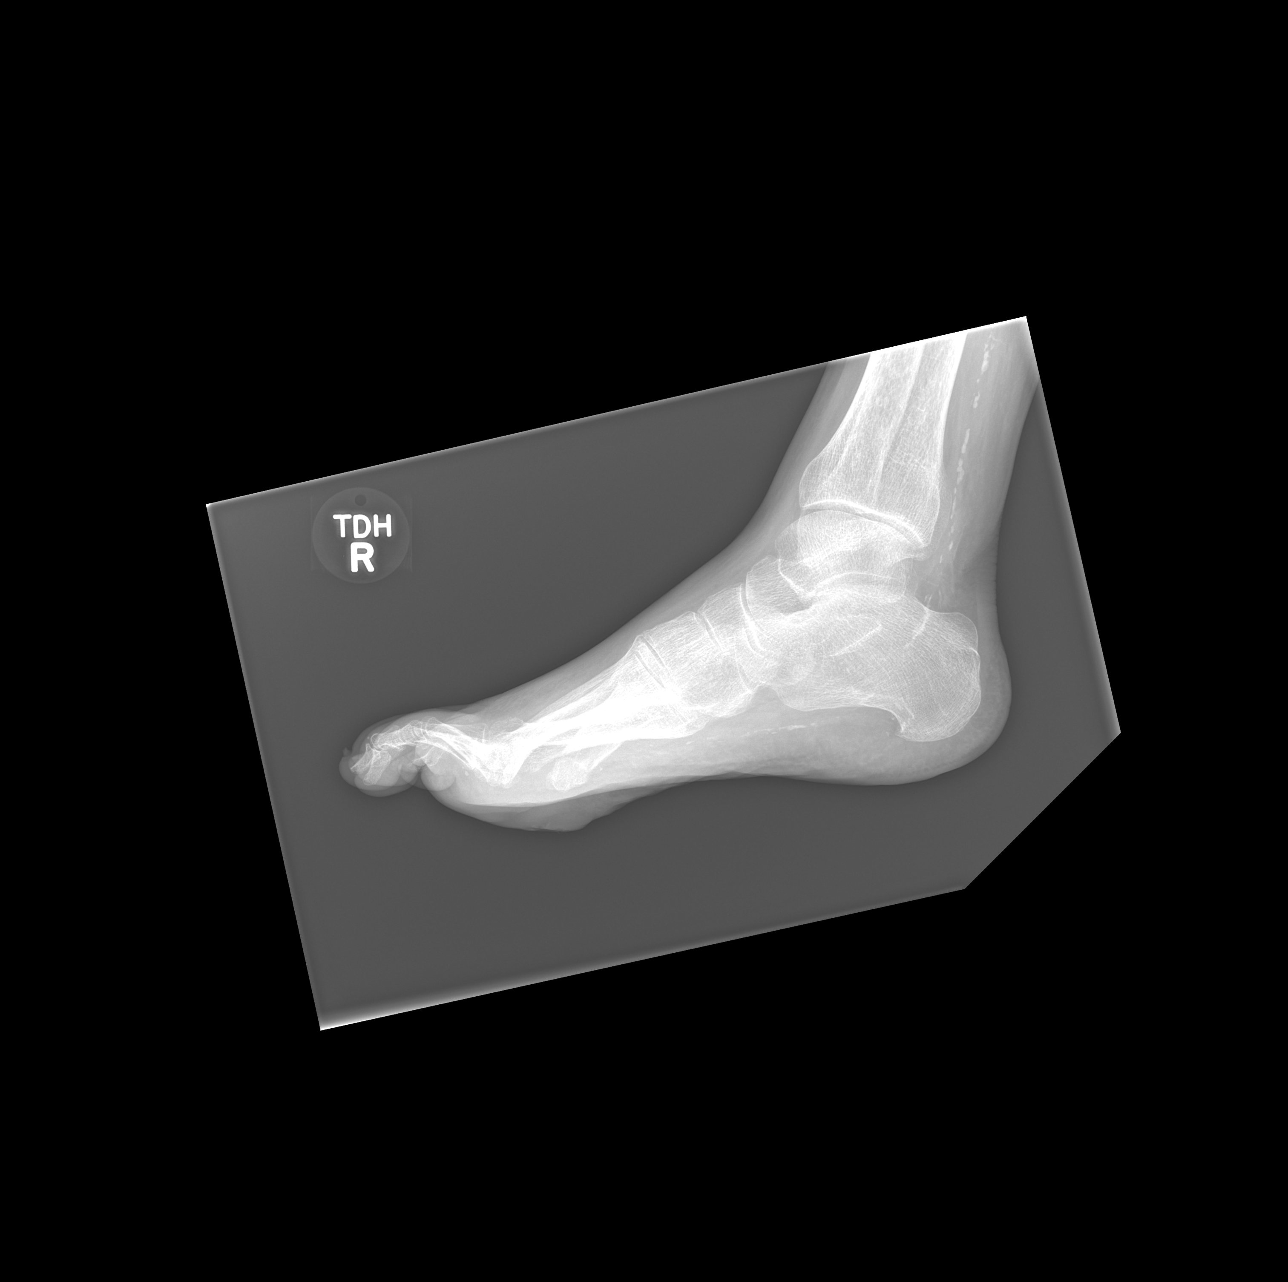

[3 of 3 positions shown; findings below may reference images not displayed]

FINDINGS: No acute bony abnormality identified. Healed postsurgical changes of
prior right great toe and partial first ray amputation. Advanced
osteoarthritis at the second metatarsophalangeal joint.

Subcutaneous gas and soft tissue swelling present overlying the
fifth metatarsal. Small amount of gas overlying the fifth
metatarsophalangeal joint.

Diffuse osteopenia.

Extensive calcifications of the posterior tibial artery, common
plantar artery, and lateral plantar arch.
IMPRESSION: No acute bony abnormality.

Soft tissue swelling and subcutaneous gas adjacent to the fifth
metatarsophalangeal joint and overlying the fifth metatarsal
diaphysis, consistent with the given history of ulcer and
inflammation/infection.

Atherosclerosis.

Surgical changes of prior left great toe amputation and partial
first ray amputation, healed.

## 2016-01-25 IMAGING — MR MR FOOT*R* W/O CM
8 series · 37 of 40 positions shown · non-contrast
Comparison: Radiographs 01/27/2014

CLINICAL DATA: Open wound on the lateral aspect of the right foot
since last [REDACTED]. Stepped on [place].

EXAM:
MRI OF THE RIGHT FOREFOOT WITHOUT CONTRAST
TECHNIQUE: Multiplanar, multisequence MR imaging was performed. No intravenous
contrast was administered.

[Series 4: T1 · coronal · 3.0mm · 0.27mm/px · 7 of 40 slices shown]
[im 1/40]
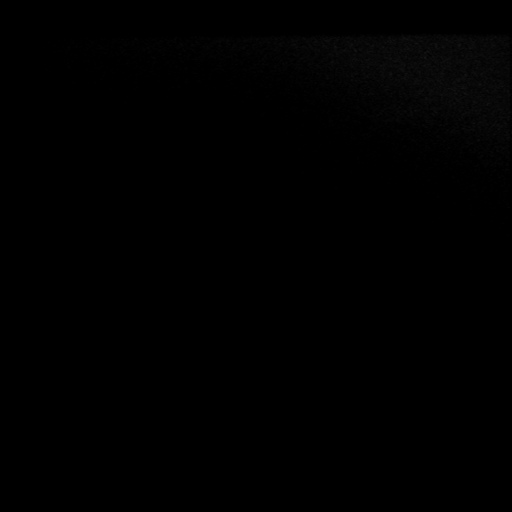
[im 7/40]
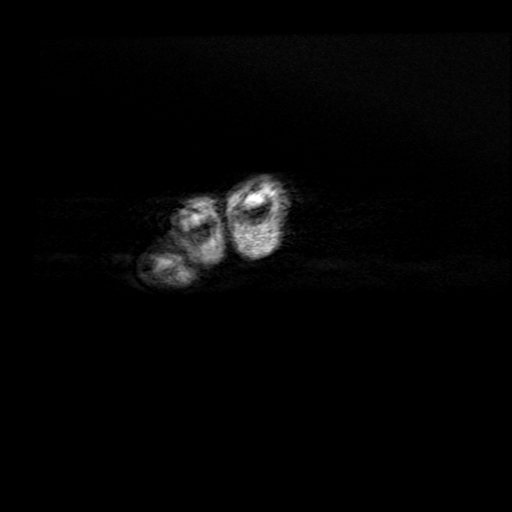
[im 14/40]
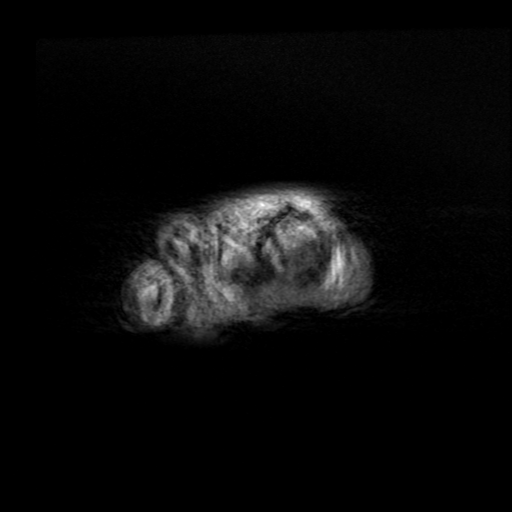
[im 20/40]
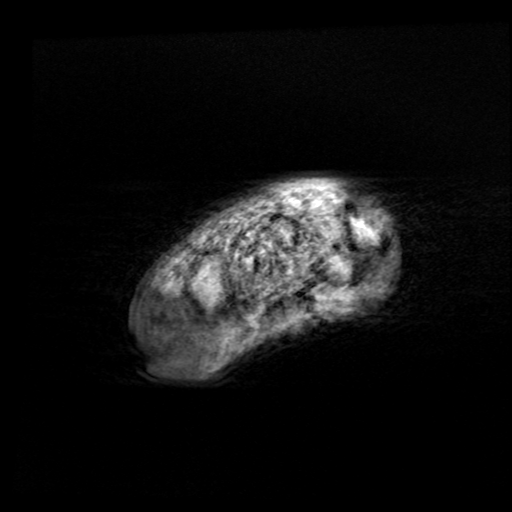
[im 27/40]
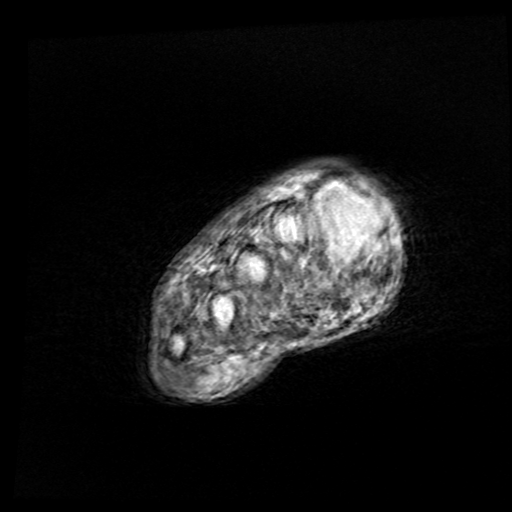
[im 33/40]
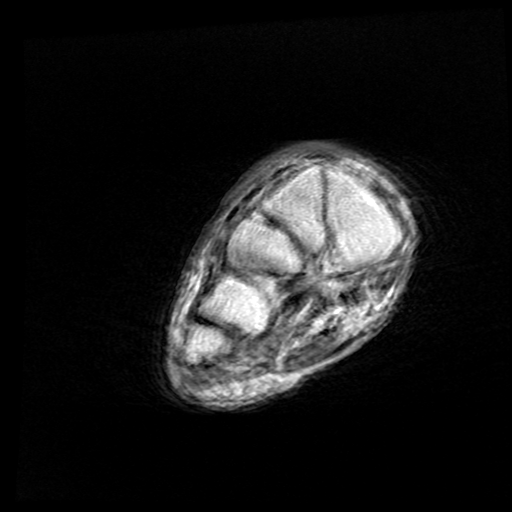
[im 40/40]
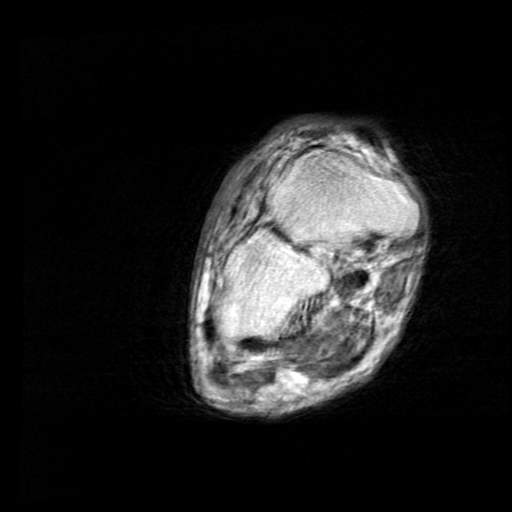

[Series 5: T1 fat-sat · coronal · non-contrast · 4.0mm · 0.27mm/px · 5 of 30 slices shown]
[im 1/30]
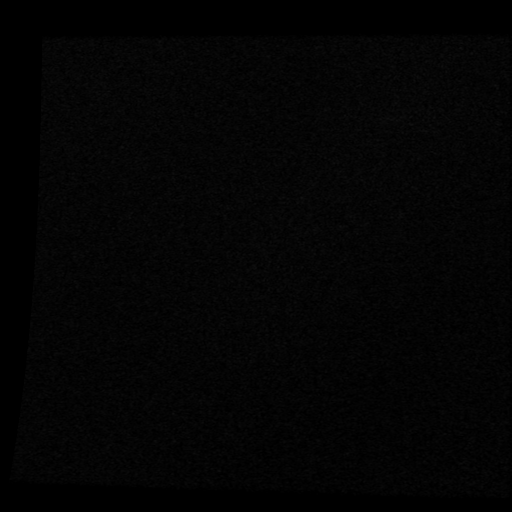
[im 8/30]
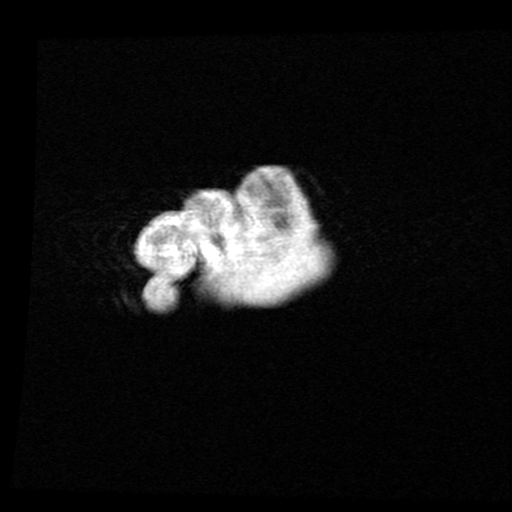
[im 15/30]
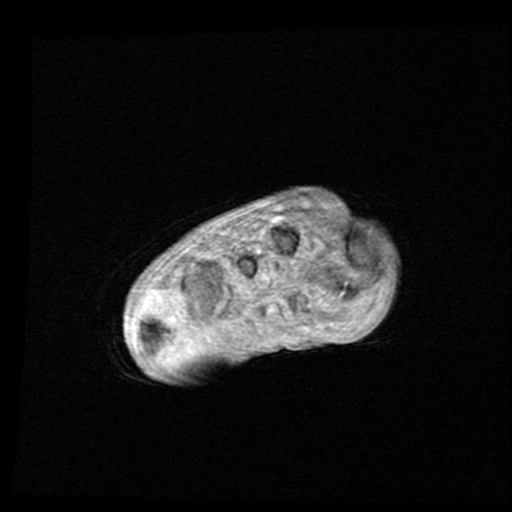
[im 22/30]
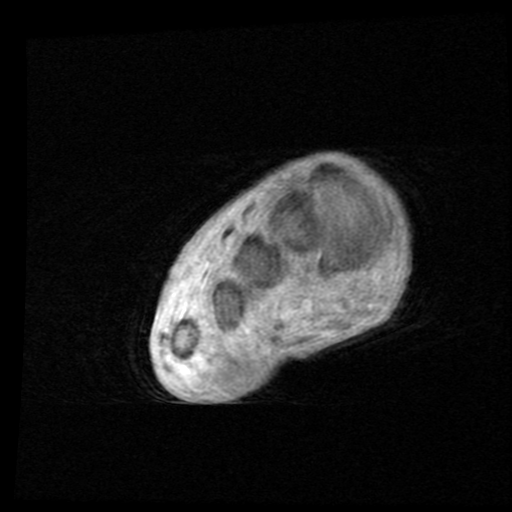
[im 30/30]
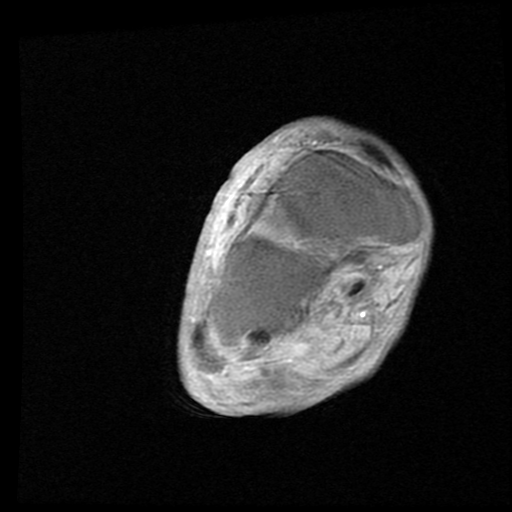

[Series 6: T2 fat-sat · coronal · 4.0mm · 0.55mm/px · 5 of 30 slices shown (1 of 3)]
[im 1/30]
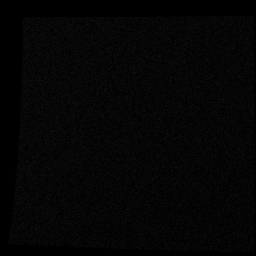
[im 8/30]
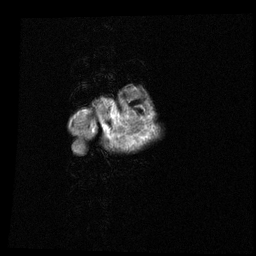
[im 15/30]
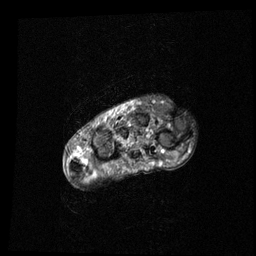
[im 22/30]
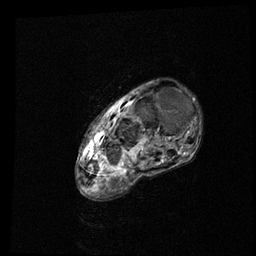
[im 30/30]
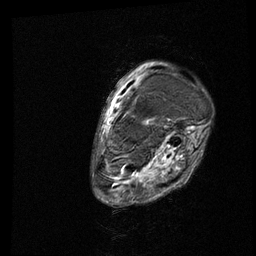

[Series 7: STIR · oblique · 3.0mm · 0.55mm/px · 4 of 19 slices shown (1 of 3)]
[im 1/19]
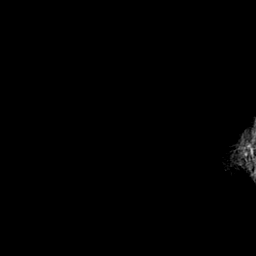
[im 7/19]
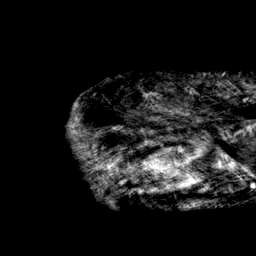
[im 13/19]
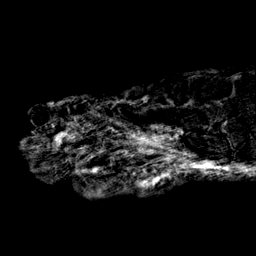
[im 19/19]
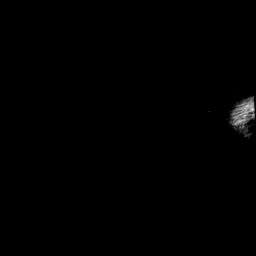

[Series 8: STIR · oblique · 3.0mm · 0.62mm/px · 5 of 26 slices shown (2 of 3)]
[im 1/26]
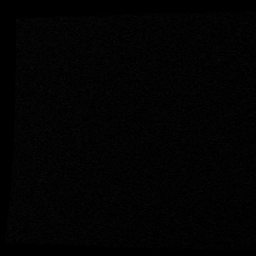
[im 7/26]
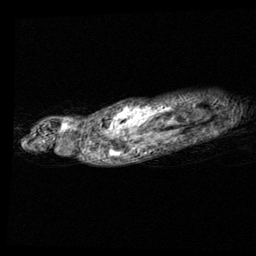
[im 13/26]
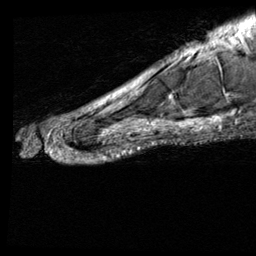
[im 19/26]
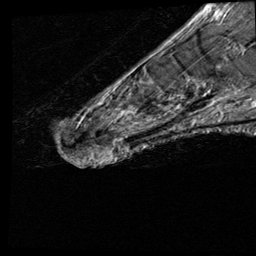
[im 26/26]
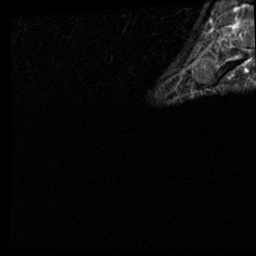

[Series 9: T2 fat-sat · oblique · 3.0mm · 0.62mm/px · 4 of 19 slices shown (2 of 3)]
[im 1/19]
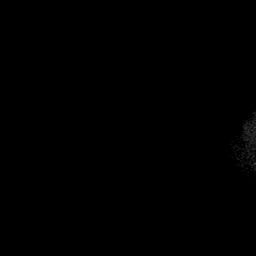
[im 7/19]
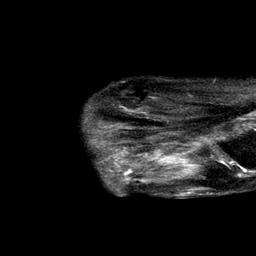
[im 13/19]
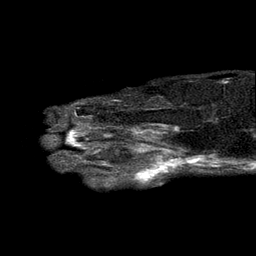
[im 19/19]
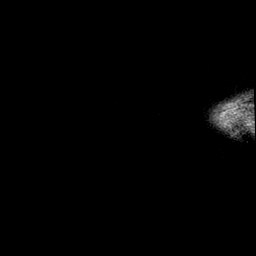

[Series 11: T2 fat-sat · coronal · 4.0mm · 0.55mm/px · 6 of 30 slices shown (3 of 3)]
[im 1/30]
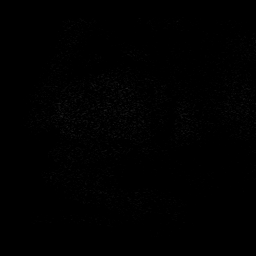
[im 6/30]
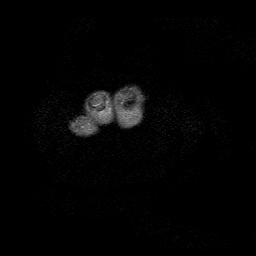
[im 12/30]
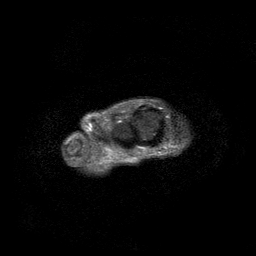
[im 18/30]
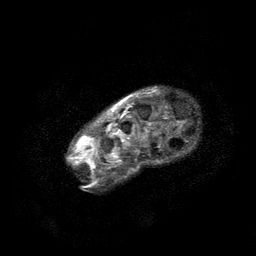
[im 24/30]
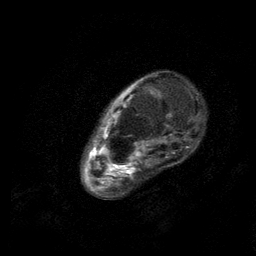
[im 30/30]
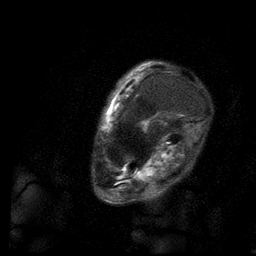

[Series 14: STIR · oblique · 3.0mm · 0.62mm/px · 1 of 19 slices shown (3 of 3)]
[im 1/19]
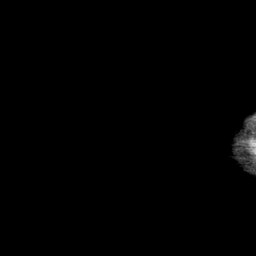

[37 of 40 positions shown; findings below may reference images not displayed]

FINDINGS: Examination is extremely limited due to patient motion. The patient
would not or could not hold still for the exam.

There is a large fluid collection surrounding the fifth metatarsal
worrisome for an abscess. There is also signal abnormality in the
fifth metatarsal shaft suspicious for osteomyelitis. No obvious
changes of septic arthritis.
IMPRESSION: Very limited examination but findings consistent with an abscess
surrounding the fifth metatarsal and fifth metatarsal osteomyelitis.

## 2016-02-17 ENCOUNTER — Telehealth: Payer: Self-pay | Admitting: *Deleted

## 2016-02-17 NOTE — Telephone Encounter (Signed)
Brandi Arellano with [employer] called and stated that she needed a signed order for patient for a Replacement Socket. Informed her that this was not our patient.

## 2016-02-23 ENCOUNTER — Telehealth (INDEPENDENT_AMBULATORY_CARE_PROVIDER_SITE_OTHER): Payer: Self-pay | Admitting: *Deleted

## 2016-02-23 NOTE — Telephone Encounter (Signed)
Pt. Called stating they lost the prescription for her prosthetic, pt requesting this to be faxed to [facility] in [place] so she cannot loose it again.

## 2016-02-25 NOTE — Telephone Encounter (Signed)
I called number provided in patients chart and was advised this was the incorrect number. Order was faxed to [facility] in [place] at [phone].

## 2016-04-13 ENCOUNTER — Ambulatory Visit (INDEPENDENT_AMBULATORY_CARE_PROVIDER_SITE_OTHER): Payer: Medicaid Other | Admitting: Orthopedic Surgery

## 2016-05-05 ENCOUNTER — Ambulatory Visit (INDEPENDENT_AMBULATORY_CARE_PROVIDER_SITE_OTHER): Payer: Medicaid Other | Admitting: Orthopedic Surgery

## 2016-05-06 ENCOUNTER — Ambulatory Visit (INDEPENDENT_AMBULATORY_CARE_PROVIDER_SITE_OTHER): Payer: Medicaid Other | Admitting: Family

## 2016-05-06 VITALS — Ht 65.0 in | Wt 137.0 lb

## 2016-05-06 DIAGNOSIS — IMO0002 Reserved for concepts with insufficient information to code with codable children: Secondary | ICD-10-CM

## 2016-05-06 DIAGNOSIS — M720 Palmar fascial fibromatosis [Dupuytren]: Secondary | ICD-10-CM | POA: Diagnosis not present

## 2016-05-06 DIAGNOSIS — Z89511 Acquired absence of right leg below knee: Secondary | ICD-10-CM

## 2016-05-06 NOTE — Progress Notes (Signed)
Office Visit Note   Patient: Brandi Arellano           Date of Birth: 06/21/1955           MRN: [id number] Visit Date: 05/06/2016              Requested by: No referring provider defined for this encounter. PCP: No primary care provider on file.  Chief Complaint  Patient presents with  . Left Hand - Pain, Numbness    HPI: Pt is here today for left hand numbness and the index through little finger triggering. The pt states that she is not able to open her hand not even manually. She complains of pain but their is no swelling. Pamella Pert, RMA  Patient is a 61 year old woman who is seen today for 2 separate issues. She is status post a remote right below the knee amputation. Her prosthesis is ill fitting. toDay is him ambulating in a wheelchair. is without her prosthetic.   Also complaining of him left hand numbness across the palmar aspect and tightness in the fingers has fixed contractures states this has been going on since last summer. Him minimal pain. However is unable to use her left hand for activities of daily living.     Assessment & Plan: Visit Diagnoses:  1. Dupuytren's contracture of left hand   2. Below knee amputation status, right ([place])     Plan: I have provided an order for new prosthetic to [employer] in [place] per her request today. I'll with prosthetists for fabrication of new limb. We'll also provide her with a referral to hand surgery for the Dupuytren's contracture on the left.  Follow-Up Instructions: Return if symptoms worsen or fail to improve.   Left Hand Exam   Tenderness  The patient is experiencing no tenderness.     Other  Erythema: absent Pulse: present  Comments:  Fixed contractures of the second through fifth fingers on the left hand. The third or finger contractures are worse. No palpable nodules across the palmar aspect.     Physical Exam  Constitutional: Appears well-developed.  Head: Normocephalic.  Eyes: EOM are  normal.  Neck: Normal range of motion.  Cardiovascular: Normal rate.   Pulmonary/Chest: Effort normal.  Neurological: Is alert.  Skin: Skin is warm.  Psychiatric: Has a normal mood and affect. Right residual limb is well consolidated well healed. No open ulcerations or sign of infection.  Imaging: No results found.  Orders:  Orders Placed This Encounter  Procedures  . Ambulatory referral to Hand Surgery   No orders of the defined types were placed in this encounter.    Procedures: No procedures performed  Clinical Data: No additional findings.  Subjective: Review of Systems  Objective: Vital Signs: Ht 5\' 5"  (1.651 m)   Wt 137 lb (62.1 kg)   BMI 22.80 kg/m   Specialty Comments:  No specialty comments available.  PMFS History: Patient Active Problem List   Diagnosis Date Noted  . Dupuytren's contracture of left hand 05/07/2016  . HLD (hyperlipidemia) 08/04/2015  . Conjunctivitis 08/04/2015  . Insomnia 07/09/2015  . Gangrene associated with diabetes mellitus ([place]) 03/20/2014  . Gastroparesis due to DM ([place]) 02/12/2014  . Below knee amputation status, right ([place]) 02/08/2014  . Buerger's disease ([place]) 02/08/2014  . Hyperparathyroidism, secondary renal ([place]) 02/06/2014  . Hyperphosphatemia 02/06/2014  . Anemia in chronic kidney disease 02/06/2014  . 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 Essential  hypertension 12/26/2013  . DM type 2, uncontrolled, with renal complications ([place]) [id number]  . CKD (chronic kidney disease) 12/26/2013  . Hypothyroidism 12/26/2013  . CAD (coronary artery disease) 12/26/2013  . End stage renal disease ([place]) 12/14/2013   Past Medical History:  Diagnosis Date  . Anemia   . CAD (coronary artery disease)   . CHF (congestive heart failure) (HCC)    Acute on chronic diastolic  . CKD (chronic kidney disease) stage 4, GFR 15-29 ml/min (HCC)   . Diabetes mellitus without complication (HCC)    Type II  .  DVT (deep venous thrombosis) ([place])   . Gangrene of lower extremity (HCC)    RLE  . Hyperlipidemia   . Hypertension   . Thrombocytopenia ([place])   . Thyroid disease    Abnormal Thyroid function Test  . UTI (lower urinary tract infection)     Family History  Problem Relation Age of Onset  . Adopted: Yes  . Hypertension Father   . Heart disease Father     Coronary Artery Disease    Past Surgical History:  Procedure Laterality Date  . ABDOMINAL ANGIOGRAM  12/26/2013   Procedure: ABDOMINAL ANGIOGRAM;  Surgeon: Serafina Milhouse, MD;  Location[facility] CATH LAB;  Service: Cardiovascular;;  . AMPUTATION Right 02/01/2014   Procedure: Right Below Knee Amputation;  Surgeon: Newt Minion, MD;  Location: [place];  Service: Orthopedics;  Laterality: Right;  . AMPUTATION Right 03/24/2014   Procedure: AMPUTATION ABOVE KNEE;  Surgeon: Newt Minion, MD;  Location: [place];  Service: Orthopedics;  Laterality: Right;  . ARCH AORTOGRAM  12/26/2013   Procedure: ARCH AORTOGRAM;  Surgeon: Serafina Barrette, MD;  Location[facility] CATH LAB;  Service: Cardiovascular;;  . CAROTID ENDARTERECTOMY     Bilateral  . CHOLECYSTECTOMY    . CORONARY ARTERY BYPASS GRAFT    . FOOT AMPUTATION Bilateral   . HARDWARE REMOVAL Right 07/19/2014   Procedure: Removal Deep Hardware Right Femur;  Surgeon: Newt Minion, MD;  Location: [place];  Service: Orthopedics;  Laterality: Right;  Six screws and one condylar plate removed   . LOWER EXTREMITY ANGIOGRAM N/A 12/26/2013   Procedure: LOWER EXTREMITY ANGIOGRAM;  Surgeon: Serafina Shockley, MD;  Location: [facility] CATH LAB;  Service: Cardiovascular;  Laterality: N/A;  . UPPER EXTREMITY ANGIOGRAM Right 12/26/2013   Procedure: UPPER EXTREMITY ANGIOGRAM;  Surgeon: Serafina Boal, MD;  Location: [facility] CATH LAB;  Service: Cardiovascular;  Laterality: Right;   Social History   Occupational History  . Not on file.   Social History Main Topics  . Smoking status: Former Smoker    Years: 15.00    Types:  Cigarettes  . Smokeless tobacco: Never Used  . Alcohol use No  . Drug use: No  . Sexual activity: Not on file

## 2016-05-07 DIAGNOSIS — M720 Palmar fascial fibromatosis [Dupuytren]: Secondary | ICD-10-CM | POA: Insufficient documentation

## 2016-05-07 HISTORY — DX: Palmar fascial fibromatosis (dupuytren): M72.0

## 2016-07-01 ENCOUNTER — Ambulatory Visit (INDEPENDENT_AMBULATORY_CARE_PROVIDER_SITE_OTHER): Payer: Medicaid Other | Admitting: Orthopedic Surgery

## 2016-07-01 ENCOUNTER — Encounter (INDEPENDENT_AMBULATORY_CARE_PROVIDER_SITE_OTHER): Payer: Self-pay | Admitting: Orthopedic Surgery

## 2016-07-01 ENCOUNTER — Telehealth (INDEPENDENT_AMBULATORY_CARE_PROVIDER_SITE_OTHER): Payer: Self-pay | Admitting: Orthopedic Surgery

## 2016-07-01 DIAGNOSIS — R2232 Localized swelling, mass and lump, left upper limb: Secondary | ICD-10-CM | POA: Diagnosis not present

## 2016-07-01 DIAGNOSIS — Z89611 Acquired absence of right leg above knee: Secondary | ICD-10-CM | POA: Diagnosis not present

## 2016-07-01 NOTE — Progress Notes (Signed)
Office Visit Note   Patient: Brandi Arellano           Date of Birth: 10-09-1955           MRN: [id number] Visit Date: 07/01/2016              Requested by: No referring provider defined for this encounter. PCP: Kerin Perna, NP  Chief Complaint  Patient presents with  . Left Shoulder - Follow-up    Axillary knot since yesterday    [id number] states that her above-the-knee amputation the right is stable without complications. She is going follow up with Dr. Burney Gauze for her Dupuytren's contractures in the left hand. She states that she was getting ready yesterday morning and noticed a knot in the left axilla. She states that her granddaughter had 2 or 3 of these that had to be removed secondary to infection. Patient denies any pain or soreness or skin color or temperature changes denies any drainage or opening. HPI  Assessment & Plan: Visit Diagnoses:  1. Axillary mass, left   2. Status post above knee amputation, right [facility])     Plan: Patient will follow up with [facility] for her new prosthesis. A appointment was requested with general surgery to evaluate the lymph node in the left axilla.  Follow-Up Instructions: Return if symptoms worsen or fail to improve.   Ortho Exam On examination patient is alert oriented no adenopathy well-dressed normal affect normal respiratory effort she relates in a wheelchair. Examination she has a stable right above-knee amputation locating features no skin breakdown. Examination the left axilla she has a soft mobile lymphatic nodule approximately 7 mm in diameter. There is no redness no cellulitis no tenderness to palpation. She does have a surgical incision in the axilla but she is unsure what this is from. She is status post CABG and carotid endarterectomy and does have vascular access for dialysis.  Imaging: No results found.  Labs: Lab Results  Component Value Date   HGBA1C 7.4 05/07/2015   HGBA1C 9.1 (H) 12/26/2013     REPTSTATUS 02/02/2014 FINAL 01/27/2014   CULT  01/27/2014    NO GROWTH 5 DAYS Performed at [facility]    Orders:  Orders Placed This Encounter  Procedures  . Ambulatory referral to General Surgery   No orders of the defined types were placed in this encounter.    Procedures: No procedures performed  Clinical Data: No additional findings.  ROS: Review of Systems  All other systems reviewed and are negative.   Objective: Vital Signs: There were no vitals taken for this visit.  Specialty Comments:  No specialty comments available.  PMFS History: Patient Active Problem List   Diagnosis Date Noted  . Axillary mass, left 07/01/2016  . Dupuytren's contracture of left hand 05/07/2016  . HLD (hyperlipidemia) 08/04/2015  . Conjunctivitis 08/04/2015  . Insomnia 07/09/2015  . Gangrene associated with diabetes mellitus ([place]) 03/20/2014  . Gastroparesis due to DM ([place]) 02/12/2014  . Status post above knee amputation, right ([place]) 02/08/2014  . Buerger's disease ([place]) 02/08/2014  . Hyperparathyroidism, secondary renal ([place]) 02/06/2014  . Hyperphosphatemia 02/06/2014  . Anemia in chronic kidney disease 02/06/2014  . 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 Essential hypertension 12/26/2013  . DM type 2, uncontrolled, with renal complications ([place]) 54/62/7035  . CKD (chronic kidney disease) 12/26/2013  . Hypothyroidism 12/26/2013  . CAD (coronary artery disease) 12/26/2013  . End stage renal  disease ([place]) 12/14/2013   Past Medical History:  Diagnosis Date  . Anemia   . CAD (coronary artery disease)   . CHF (congestive heart failure) (HCC)    Acute on chronic diastolic  . CKD (chronic kidney disease) stage 4, GFR 15-29 ml/min (HCC)   . Diabetes mellitus without complication (HCC)    Type II  . DVT (deep venous thrombosis) ([place])   . Gangrene of lower extremity (HCC)    RLE  . Hyperlipidemia   . Hypertension   .  Thrombocytopenia ([place])   . Thyroid disease    Abnormal Thyroid function Test  . UTI (lower urinary tract infection)     Family History  Problem Relation Age of Onset  . Adopted: Yes  . Hypertension Father   . Heart disease Father     Coronary Artery Disease    Past Surgical History:  Procedure Laterality Date  . ABDOMINAL ANGIOGRAM  12/26/2013   Procedure: ABDOMINAL ANGIOGRAM;  Surgeon: Serafina Lehenbauer, MD;  Location: [facility] CATH LAB;  Service: Cardiovascular;;  . AMPUTATION Right 02/01/2014   Procedure: Right Below Knee Amputation;  Surgeon: Newt Minion, MD;  Location: [place];  Service: Orthopedics;  Laterality: Right;  . AMPUTATION Right 03/24/2014   Procedure: AMPUTATION ABOVE KNEE;  Surgeon: Newt Minion, MD;  Location: [place];  Service: Orthopedics;  Laterality: Right;  . ARCH AORTOGRAM  12/26/2013   Procedure: ARCH AORTOGRAM;  Surgeon: Serafina Craun, MD;  Location: [facility] CATH LAB;  Service: Cardiovascular;;  . CAROTID ENDARTERECTOMY     Bilateral  . CHOLECYSTECTOMY    . CORONARY ARTERY BYPASS GRAFT    . FOOT AMPUTATION Bilateral   . HARDWARE REMOVAL Right 07/19/2014   Procedure: Removal Deep Hardware Right Femur;  Surgeon: Newt Minion, MD;  Location: [place];  Service: Orthopedics;  Laterality: Right;  Six screws and one condylar plate removed   . LOWER EXTREMITY ANGIOGRAM N/A 12/26/2013   Procedure: LOWER EXTREMITY ANGIOGRAM;  Surgeon: Serafina Hefel, MD;  Location: [facility] CATH LAB;  Service: Cardiovascular;  Laterality: N/A;  . UPPER EXTREMITY ANGIOGRAM Right 12/26/2013   Procedure: UPPER EXTREMITY ANGIOGRAM;  Surgeon: Serafina Berkel, MD;  Location: [facility] CATH LAB;  Service: Cardiovascular;  Laterality: Right;   Social History   Occupational History  . Not on file.   Social History Main Topics  . Smoking status: Former Smoker    Years: 15.00    Types: Cigarettes  . Smokeless tobacco: Never Used  . Alcohol use No  . Drug use: No  . Sexual activity: Not on file

## 2016-07-01 NOTE — Telephone Encounter (Signed)
Pamala Hurry from [employer] called to follow up on a fax that was sent to [facility] in regards to a detailed prescription for a prostheses that was sent on 3/14.  CB#[id number].  Thank you.

## 2016-07-06 ENCOUNTER — Encounter (INDEPENDENT_AMBULATORY_CARE_PROVIDER_SITE_OTHER): Payer: Self-pay | Admitting: *Deleted

## 2016-09-17 ENCOUNTER — Other Ambulatory Visit: Payer: Self-pay | Admitting: Orthopedic Surgery

## 2016-09-20 ENCOUNTER — Encounter (HOSPITAL_COMMUNITY): Payer: Self-pay | Admitting: Anesthesiology

## 2016-09-20 ENCOUNTER — Encounter (HOSPITAL_COMMUNITY): Payer: Self-pay | Admitting: *Deleted

## 2016-09-20 NOTE — Progress Notes (Signed)
Ms Wickizer denies chest pain or shortness of breath. Ms Ehrmann states That CBG's run < 140, never < 70. I instructed patient to check CBG to check CBG and if it is less than 70 to treat it with 1/2 cup of clear juice like apple juice or cranberry juice. I instructed patient to recheck CBG in 15 minutes and if CBG is not greater than 70, to  Call [phone] (pre- op). If it is before pre-op opens to retreat as before and recheck CBG in 15 minutes. I told patient to make note of time that liquid is taken and amount, that surgical time may have to be adjusted.

## 2016-09-21 ENCOUNTER — Ambulatory Visit (HOSPITAL_COMMUNITY): Admission: RE | Admit: 2016-09-21 | Payer: Medicaid Other | Source: Ambulatory Visit | Admitting: Orthopedic Surgery

## 2016-09-21 HISTORY — DX: Cerebral infarction, unspecified: I63.9

## 2016-09-21 HISTORY — DX: Hypothyroidism, unspecified: E03.9

## 2016-09-21 HISTORY — DX: Gastro-esophageal reflux disease without esophagitis: K21.9

## 2016-09-21 HISTORY — DX: End stage renal disease: N18.6

## 2016-09-21 SURGERY — RELEASE, A1 PULLEY, FOR TRIGGER FINGER
Anesthesia: General | Site: Finger | Laterality: Right

## 2016-10-05 ENCOUNTER — Other Ambulatory Visit: Payer: Self-pay | Admitting: Orthopedic Surgery

## 2016-10-06 ENCOUNTER — Encounter (HOSPITAL_BASED_OUTPATIENT_CLINIC_OR_DEPARTMENT_OTHER): Payer: Self-pay | Admitting: *Deleted

## 2016-10-28 ENCOUNTER — Ambulatory Visit (HOSPITAL_BASED_OUTPATIENT_CLINIC_OR_DEPARTMENT_OTHER): Admission: RE | Admit: 2016-10-28 | Payer: Medicaid Other | Source: Ambulatory Visit | Admitting: Orthopedic Surgery

## 2016-10-28 HISTORY — DX: Acquired absence of right leg above knee: Z89.611

## 2016-10-28 HISTORY — DX: Other specified postprocedural states: Z98.890

## 2016-10-28 HISTORY — DX: Other complications of anesthesia, initial encounter: T88.59XA

## 2016-10-28 HISTORY — DX: Adverse effect of unspecified anesthetic, initial encounter: T41.45XA

## 2016-10-28 HISTORY — DX: Other specified postprocedural states: R11.2

## 2016-10-28 SURGERY — RELEASE, A1 PULLEY, FOR TRIGGER FINGER
Anesthesia: Regional | Laterality: Right

## 2016-11-11 ENCOUNTER — Emergency Department (HOSPITAL_COMMUNITY): Payer: Medicaid Other

## 2016-11-11 ENCOUNTER — Encounter (HOSPITAL_COMMUNITY): Payer: Self-pay | Admitting: *Deleted

## 2016-11-11 ENCOUNTER — Emergency Department (HOSPITAL_COMMUNITY)
Admission: EM | Admit: 2016-11-11 | Discharge: 2016-11-11 | Disposition: A | Discharge status: Home / Self Care | Payer: Medicaid Other | Attending: Emergency Medicine | Admitting: Emergency Medicine

## 2016-11-11 DIAGNOSIS — R51 Headache: Secondary | ICD-10-CM | POA: Diagnosis not present

## 2016-11-11 DIAGNOSIS — Z87891 Personal history of nicotine dependence: Secondary | ICD-10-CM | POA: Insufficient documentation

## 2016-11-11 DIAGNOSIS — Z992 Dependence on renal dialysis: Secondary | ICD-10-CM | POA: Diagnosis not present

## 2016-11-11 DIAGNOSIS — I5033 Acute on chronic diastolic (congestive) heart failure: Secondary | ICD-10-CM | POA: Insufficient documentation

## 2016-11-11 DIAGNOSIS — I2510 Atherosclerotic heart disease of native coronary artery without angina pectoris: Secondary | ICD-10-CM | POA: Diagnosis not present

## 2016-11-11 DIAGNOSIS — Z86718 Personal history of other venous thrombosis and embolism: Secondary | ICD-10-CM | POA: Insufficient documentation

## 2016-11-11 DIAGNOSIS — E785 Hyperlipidemia, unspecified: Secondary | ICD-10-CM | POA: Diagnosis not present

## 2016-11-11 DIAGNOSIS — I120 Hypertensive chronic kidney disease with stage 5 chronic kidney disease or end stage renal disease: Secondary | ICD-10-CM | POA: Diagnosis not present

## 2016-11-11 DIAGNOSIS — R519 Headache, unspecified: Secondary | ICD-10-CM

## 2016-11-11 DIAGNOSIS — Z8673 Personal history of transient ischemic attack (TIA), and cerebral infarction without residual deficits: Secondary | ICD-10-CM | POA: Diagnosis not present

## 2016-11-11 DIAGNOSIS — E039 Hypothyroidism, unspecified: Secondary | ICD-10-CM | POA: Diagnosis not present

## 2016-11-11 DIAGNOSIS — E119 Type 2 diabetes mellitus without complications: Secondary | ICD-10-CM | POA: Diagnosis not present

## 2016-11-11 DIAGNOSIS — Z79899 Other long term (current) drug therapy: Secondary | ICD-10-CM | POA: Insufficient documentation

## 2016-11-11 DIAGNOSIS — N186 End stage renal disease: Secondary | ICD-10-CM | POA: Insufficient documentation

## 2016-11-11 DIAGNOSIS — I110 Hypertensive heart disease with heart failure: Secondary | ICD-10-CM | POA: Diagnosis not present

## 2016-11-11 LAB — BASIC METABOLIC PANEL
ANION GAP: 12 (ref 5–15)
BUN: 31 mg/dL — ABNORMAL HIGH (ref 6–20)
CHLORIDE: 101 mmol/L (ref 101–111)
CO2: 24 mmol/L (ref 22–32)
Calcium: 9.1 mg/dL (ref 8.9–10.3)
Creatinine, Ser: 4.32 mg/dL — ABNORMAL HIGH (ref 0.44–1.00)
GFR calc non Af Amer: 10 mL/min — ABNORMAL LOW (ref >60)
GFR, EST AFRICAN AMERICAN: 12 mL/min — AB (ref >60)
Glucose, Bld: 153 mg/dL — ABNORMAL HIGH (ref 65–99)
POTASSIUM: 4.3 mmol/L (ref 3.5–5.1)
SODIUM: 137 mmol/L (ref 135–145)

## 2016-11-11 LAB — CBC
HEMATOCRIT: 40.7 % (ref 36.0–46.0)
HEMOGLOBIN: 13.1 g/dL (ref 12.0–15.0)
MCH: 30.3 pg (ref 26.0–34.0)
MCHC: 32.2 g/dL (ref 30.0–36.0)
MCV: 94.2 fL (ref 78.0–100.0)
PLATELETS: 101 10*3/uL — AB (ref 150–400)
RBC: 4.32 MIL/uL (ref 3.87–5.11)
RDW: 16.4 % — ABNORMAL HIGH (ref 11.5–15.5)
WBC: 3.2 10*3/uL — AB (ref 4.0–10.5)

## 2016-11-11 LAB — I-STAT TROPONIN, ED: Troponin i, poc: 0.05 ng/mL (ref 0.00–0.08)

## 2016-11-11 MED ORDER — DEXAMETHASONE SODIUM PHOSPHATE 10 MG/ML IJ SOLN
10.0000 mg | Freq: Once | INTRAMUSCULAR | Status: AC
  Administered 2016-11-11: 10 mg via INTRAVENOUS
  Filled 2016-11-11: qty 1

## 2016-11-11 MED ORDER — AMLODIPINE BESYLATE 5 MG PO TABS
5.0000 mg | ORAL_TABLET | Freq: Once | ORAL | Status: AC
  Administered 2016-11-11: 5 mg via ORAL
  Filled 2016-11-11: qty 1

## 2016-11-11 MED ORDER — METOCLOPRAMIDE HCL 5 MG/ML IJ SOLN
10.0000 mg | Freq: Once | INTRAMUSCULAR | Status: AC
  Administered 2016-11-11: 10 mg via INTRAVENOUS
  Filled 2016-11-11: qty 2

## 2016-11-11 MED ORDER — ACETAMINOPHEN 500 MG PO TABS
1000.0000 mg | ORAL_TABLET | Freq: Once | ORAL | Status: AC
  Administered 2016-11-11: 1000 mg via ORAL
  Filled 2016-11-11: qty 2

## 2016-11-11 NOTE — ED Notes (Signed)
Pt has right AKA.

## 2016-11-11 NOTE — Discharge Instructions (Signed)
Continue your medications as before, and go to dialysis tomorrow as scheduled.  Return to the emergency department if your symptoms significantly worsen or change.

## 2016-11-11 NOTE — ED Notes (Signed)
Attempted IV stick x 1-- without success-- will seek additional assistance.

## 2016-11-11 NOTE — ED Triage Notes (Addendum)
Pt to ED from home: originally called EMS for CP that had subsided by the time the ambulance arrived. Pt primarily c/o severe R sided frontal headache x 2 days. Hx of dialysis (Monday, Wednesday, Saturday) with last treatment yesterday

## 2016-11-11 NOTE — ED Provider Notes (Signed)
[facility] DEPT Provider Note   CSN: [id number] Arrival date & time: 11/11/16  6761     History   Chief Complaint Chief Complaint  Patient presents with  . Headache  . Hypertension    HPI Brandi Arellano is a 61 y.o. female.  Patient is a 61 year old female with past medical history of hypertension, coronary artery disease with CABG, CHF, diabetes, and end-stage renal disease on hemodialysis. She presents today for evaluation of headache. This started sometime during the night and woke her from sleep. It began in the absence of any injury or trauma. She denies any weakness, numbness, visual disturbances area the pain is mainly located to the sides of her head. She has not taking anything for the pain.  She was last dialyzed yesterday and denies having missed any dialysis sessions.   The history is provided by the patient.  Headache   This is a new problem. The current episode started yesterday. The problem occurs constantly. The problem has been rapidly worsening. The headache is associated with bright light. The pain is located in the parietal region. The pain is severe. The pain does not radiate. Pertinent negatives include no anorexia, no fever and no malaise/fatigue. She has tried nothing for the symptoms.  Hypertension  Associated symptoms include headaches.    Past Medical History:  Diagnosis Date  . Anemia   . CAD (coronary artery disease)    CABG  . CHF (congestive heart failure) (HCC)    Acute on chronic diastolic  . Complication of anesthesia   . Diabetes mellitus without complication (HCC)    Type II  . DVT (deep venous thrombosis) ([place])   . ESRD (end stage renal disease) ([place])    HD M-W-F  . Gangrene of lower extremity (HCC)    RLE  . GERD (gastroesophageal reflux disease)   . Hx of AKA (above knee amputation), right ([place])   . Hyperlipidemia   . Hypertension   . Hypothyroidism   . PONV (postoperative nausea and vomiting)   . 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 Thrombocytopenia ([place])   . Thyroid disease    Abnormal Thyroid function Test  . UTI (lower urinary tract infection)     Patient Active Problem List   Diagnosis Date Noted  . Axillary mass, left 07/01/2016  . Dupuytren's contracture of left hand 05/07/2016  . HLD (hyperlipidemia) 08/04/2015  . Conjunctivitis 08/04/2015  . Insomnia 07/09/2015  . Gangrene associated with diabetes mellitus ([place]) 03/20/2014  . Gastroparesis due to DM ([place]) 02/12/2014  . Status post above knee amputation, right ([place]) 02/08/2014  . Buerger's disease ([place]) 02/08/2014  . Hyperparathyroidism, secondary renal ([place]) 02/06/2014  . Hyperphosphatemia 02/06/2014  . Anemia in chronic kidney disease 02/06/2014  . 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 Essential hypertension 12/26/2013  . DM type 2, uncontrolled, with renal complications ([place]) 95/12/3265  . CKD (chronic kidney disease) 12/26/2013  . Hypothyroidism 12/26/2013  . CAD (coronary artery disease) 12/26/2013  . End stage renal disease ([place]) 12/14/2013    Past Surgical History:  Procedure Laterality Date  . ABDOMINAL ANGIOGRAM  12/26/2013   Procedure: ABDOMINAL ANGIOGRAM;  Surgeon: Serafina Righi, MD;  Location: [facility] CATH LAB;  Service: Cardiovascular;;  . AMPUTATION Right 02/01/2014   Procedure: Right Below Knee Amputation;  Surgeon: Newt Minion, MD;  Location: [place];  Service: Orthopedics;  Laterality: Right;  . AMPUTATION Right 03/24/2014   Procedure: AMPUTATION  ABOVE KNEE;  Surgeon: Newt Minion, MD;  Location: [place];  Service: Orthopedics;  Laterality: Right;  . ARCH AORTOGRAM  12/26/2013   Procedure: ARCH AORTOGRAM;  Surgeon: Serafina Congrove, MD;  Location: [facility] CATH LAB;  Service: Cardiovascular;;  . CAROTID ENDARTERECTOMY     Bilateral  . CHOLECYSTECTOMY    . CORONARY ARTERY BYPASS GRAFT    . FOOT AMPUTATION Bilateral   . HARDWARE REMOVAL Right 07/19/2014   Procedure: Removal Deep Hardware  Right Femur;  Surgeon: Newt Minion, MD;  Location: [place];  Service: Orthopedics;  Laterality: Right;  Six screws and one condylar plate removed   . hemodialysis catheter    . LOWER EXTREMITY ANGIOGRAM N/A 12/26/2013   Procedure: LOWER EXTREMITY ANGIOGRAM;  Surgeon: Serafina Millan, MD;  Location: [facility] CATH LAB;  Service: Cardiovascular;  Laterality: N/A;  . UPPER EXTREMITY ANGIOGRAM Right 12/26/2013   Procedure: UPPER EXTREMITY ANGIOGRAM;  Surgeon: Serafina Trant, MD;  Location[facility] CATH LAB;  Service: Cardiovascular;  Laterality: Right;    OB History    No data available       Home Medications    Prior to Admission medications   Medication Sig Start Date End Date Taking? Authorizing Provider  amLODipine (NORVASC) 5 MG tablet Take 5 mg by mouth daily.    [provider]  aspirin EC 81 MG tablet Take 81 mg by mouth 2 (two) times daily.     [provider]  atorvastatin (LIPITOR) 40 MG tablet Take 40 mg by mouth every evening.     [provider]  calcium acetate (PHOSLO) 667 MG capsule Take 667 mg by mouth 3 (three) times daily before meals.     [provider]  insulin aspart (NOVOLOG) 100 UNIT/ML injection Inject 5 Units into the skin. Check BS twice daily : If >150 inject 5 units    [provider]  lanthanum (FOSRENOL) 1000 MG chewable tablet Chew 1,000 mg by mouth See admin instructions. Pt to take 1 tablet with meals three times daily and 1 tablet with snacks    [provider]  levothyroxine (SYNTHROID, LEVOTHROID) 50 MCG tablet Take 50 mcg by mouth daily before breakfast.     [provider]    Family History Family History  Problem Relation Age of Onset  . Adopted: Yes  . Hypertension Father   . Heart disease Father        Coronary Artery Disease    Social History Social History  Substance Use Topics  . Smoking status: Former Smoker    Years: 15.00    Types: Cigarettes  . Smokeless tobacco: Never Used      Comment: quit in 2002  . Alcohol use No     Allergies   Diphenhydramine; Tramadol hcl; and Morphine and related   Review of Systems Review of Systems  Constitutional: Negative for fever and malaise/fatigue.  Gastrointestinal: Negative for anorexia.  Neurological: Positive for headaches.  All other systems reviewed and are negative.    Physical Exam Updated Vital Signs BP (!) 204/102   Pulse 72   Temp 97.8 F (36.6 C)   Resp 16   SpO2 98%   Physical Exam  Constitutional: She is oriented to person, place, and time. She appears well-developed and well-nourished. No distress.  HENT:  Head: Normocephalic and atraumatic.  Mouth/Throat: Oropharynx is clear and moist.  Eyes: Pupils are equal, round, and reactive to light. EOM are normal.  Neck: Normal range of  motion. Neck supple.  Cardiovascular: Normal rate and regular rhythm.  Exam reveals no gallop and no friction rub.   No murmur heard. Pulmonary/Chest: Effort normal and breath sounds normal. No respiratory distress. She has no wheezes.  Abdominal: Soft. Bowel sounds are normal. She exhibits no distension. There is no tenderness.  Musculoskeletal: Normal range of motion. She exhibits no edema.  Neurological: She is alert and oriented to person, place, and time. No cranial nerve deficit. She exhibits normal muscle tone. Coordination normal.  Skin: Skin is warm and dry. She is not diaphoretic.  Nursing note and vitals reviewed.    ED Treatments / Results  Labs (all labs ordered are listed, but only abnormal results are displayed) Labs Reviewed  BASIC METABOLIC PANEL  CBC  I-STAT TROPONIN, ED    EKG  EKG Interpretation  Date/Time:  Thursday November 11 2016 06:38:35 EDT Ventricular Rate:  79 PR Interval:    QRS Duration: 119 QT Interval:  417 QTC Calculation: 478 R Axis:   51 Text Interpretation:  Sinus rhythm Probable left atrial enlargement LVH with secondary repolarization abnormality Anterior ST elevation,  probably due to LVH No significant change since last tracing Confirmed by Pryor Curia [id number]) on 11/11/2016 6:43:55 AM       Radiology Dg Chest Portable 1 View  Result Date: 11/11/2016 CLINICAL DATA:  Chest pain.  Hypertension.  Dialysis. EXAM: PORTABLE CHEST 1 VIEW COMPARISON:  03/25/2017. FINDINGS: Dialysis catheter noted with tip over the right atrium. Prior CABG. Cardiomegaly with diffuse mild bilateral pulmonary interstitial prominence suggesting mild CHF. No pleural effusion or pneumothorax. Peripheral vascular and carotid calcification. Vascular stent again noted over the left upper chest. Surgical clips noted over the neck IMPRESSION: 1. Dialysis catheter noted with tip projected over right atrium. 2. Cardiomegaly with mild bilateral interstitial prominence suggesting mild CHF. 3. Bilateral subclavian and carotid atherosclerotic vascular disease. Vascular stent over the left upper chest again noted . Electronically Signed   By: Marcello Moores  Register   On: 11/11/2016 07:04    Procedures Procedures (including critical care time)  Medications Ordered in ED Medications  dexamethasone (DECADRON) injection 10 mg (not administered)  metoCLOPramide (REGLAN) injection 10 mg (not administered)  acetaminophen (TYLENOL) tablet 1,000 mg (not administered)  amLODipine (NORVASC) tablet 5 mg (not administered)     Initial Impression / Assessment and Plan / ED Course  I have reviewed the triage vital signs and the nursing notes.  Pertinent labs & imaging results that were available during my care of the patient were reviewed by me and considered in my medical decision making (see chart for details).  Patient with history of end-stage renal disease on hemodialysis presenting with headache. She was hypertensive on presentation, however did not take her medications this morning. She was given medications and is now feeling better. She will be discharged, to follow-up as needed. When I reevaluated the  patient and her blood pressure is now 137/92 and is resting comfortably.  Final Clinical Impressions(s) / ED Diagnoses   Final diagnoses:  None    New Prescriptions New Prescriptions   No medications on file     Veryl Speak, MD 11/11/16 1021

## 2017-01-01 ENCOUNTER — Emergency Department (HOSPITAL_COMMUNITY): Payer: Medicaid Other

## 2017-01-01 ENCOUNTER — Encounter (HOSPITAL_COMMUNITY): Payer: Self-pay | Admitting: Emergency Medicine

## 2017-01-01 ENCOUNTER — Emergency Department (HOSPITAL_COMMUNITY)
Admission: EM | Admit: 2017-01-01 | Discharge: 2017-01-01 | Disposition: A | Discharge status: Home / Self Care | Payer: Medicaid Other | Attending: Emergency Medicine | Admitting: Emergency Medicine

## 2017-01-01 DIAGNOSIS — Z79899 Other long term (current) drug therapy: Secondary | ICD-10-CM | POA: Insufficient documentation

## 2017-01-01 DIAGNOSIS — E1122 Type 2 diabetes mellitus with diabetic chronic kidney disease: Secondary | ICD-10-CM | POA: Diagnosis not present

## 2017-01-01 DIAGNOSIS — Z794 Long term (current) use of insulin: Secondary | ICD-10-CM | POA: Insufficient documentation

## 2017-01-01 DIAGNOSIS — I5033 Acute on chronic diastolic (congestive) heart failure: Secondary | ICD-10-CM | POA: Diagnosis not present

## 2017-01-01 DIAGNOSIS — Z7982 Long term (current) use of aspirin: Secondary | ICD-10-CM | POA: Diagnosis not present

## 2017-01-01 DIAGNOSIS — E039 Hypothyroidism, unspecified: Secondary | ICD-10-CM | POA: Diagnosis not present

## 2017-01-01 DIAGNOSIS — Z951 Presence of aortocoronary bypass graft: Secondary | ICD-10-CM | POA: Diagnosis not present

## 2017-01-01 DIAGNOSIS — N186 End stage renal disease: Secondary | ICD-10-CM | POA: Insufficient documentation

## 2017-01-01 DIAGNOSIS — I2510 Atherosclerotic heart disease of native coronary artery without angina pectoris: Secondary | ICD-10-CM | POA: Diagnosis not present

## 2017-01-01 DIAGNOSIS — R339 Retention of urine, unspecified: Secondary | ICD-10-CM

## 2017-01-01 DIAGNOSIS — I132 Hypertensive heart and chronic kidney disease with heart failure and with stage 5 chronic kidney disease, or end stage renal disease: Secondary | ICD-10-CM | POA: Diagnosis not present

## 2017-01-01 DIAGNOSIS — Z87891 Personal history of nicotine dependence: Secondary | ICD-10-CM | POA: Diagnosis not present

## 2017-01-01 DIAGNOSIS — R109 Unspecified abdominal pain: Secondary | ICD-10-CM | POA: Diagnosis present

## 2017-01-01 DIAGNOSIS — N390 Urinary tract infection, site not specified: Secondary | ICD-10-CM | POA: Diagnosis not present

## 2017-01-01 LAB — COMPREHENSIVE METABOLIC PANEL
ALBUMIN: 4 g/dL (ref 3.5–5.0)
ALK PHOS: 77 U/L (ref 38–126)
ALT: 14 U/L (ref 14–54)
AST: 24 U/L (ref 15–41)
Anion gap: 15 (ref 5–15)
BILIRUBIN TOTAL: 0.9 mg/dL (ref 0.3–1.2)
BUN: 36 mg/dL — AB (ref 6–20)
CALCIUM: 9.4 mg/dL (ref 8.9–10.3)
CO2: 22 mmol/L (ref 22–32)
CREATININE: 5.49 mg/dL — AB (ref 0.44–1.00)
Chloride: 100 mmol/L — ABNORMAL LOW (ref 101–111)
GFR calc Af Amer: 9 mL/min — ABNORMAL LOW (ref >60)
GFR, EST NON AFRICAN AMERICAN: 8 mL/min — AB (ref >60)
GLUCOSE: 187 mg/dL — AB (ref 65–99)
POTASSIUM: 4.2 mmol/L (ref 3.5–5.1)
Sodium: 137 mmol/L (ref 135–145)
TOTAL PROTEIN: 7.4 g/dL (ref 6.5–8.1)

## 2017-01-01 LAB — URINALYSIS, ROUTINE W REFLEX MICROSCOPIC
Bilirubin Urine: NEGATIVE
Ketones, ur: NEGATIVE mg/dL
NITRITE: NEGATIVE
Specific Gravity, Urine: 1.01 (ref 1.005–1.030)
pH: 7 (ref 5.0–8.0)

## 2017-01-01 LAB — CBC
HEMATOCRIT: 40.6 % (ref 36.0–46.0)
Hemoglobin: 12.7 g/dL (ref 12.0–15.0)
MCH: 31.4 pg (ref 26.0–34.0)
MCHC: 31.3 g/dL (ref 30.0–36.0)
MCV: 100.5 fL — ABNORMAL HIGH (ref 78.0–100.0)
PLATELETS: 149 10*3/uL — AB (ref 150–400)
RBC: 4.04 MIL/uL (ref 3.87–5.11)
RDW: 18.2 % — AB (ref 11.5–15.5)
WBC: 6.3 10*3/uL (ref 4.0–10.5)

## 2017-01-01 LAB — LIPASE, BLOOD: Lipase: 295 U/L — ABNORMAL HIGH (ref 11–51)

## 2017-01-01 MED ORDER — CEFPODOXIME PROXETIL 200 MG PO TABS: ORAL_TABLET | ORAL | 0 refills | Status: DC

## 2017-01-01 MED ORDER — DEXTROSE 5 % IV SOLN
1.0000 g | Freq: Once | INTRAVENOUS | Status: AC
  Administered 2017-01-01: 1 g via INTRAVENOUS
  Filled 2017-01-01: qty 10

## 2017-01-01 NOTE — ED Triage Notes (Signed)
Pt c/o intermittent  lower abdominal pain. Pt c/o urinary frequency and painful urination. Denies nausea/vomiting/diarrhea.

## 2017-01-01 NOTE — Discharge Instructions (Signed)
Take vantin as prescribed, your next doses on Monday after dialysis. Follow up with your family doctor on Monday or Tuesday for possible Foley catheter removal and to follow-up on your urine cultures. Return if worsening symptoms.

## 2017-01-01 NOTE — ED Notes (Signed)
Patient transported to CT 

## 2017-01-01 NOTE — ED Notes (Signed)
Pt O2 saturations decreased to 82% on room air with good pleth, pt placed on 2L [place] and O2 sats came up to 96%. Pt denies having to be on oxygen at home

## 2017-01-01 NOTE — ED Notes (Signed)
ED Provider at bedside. 

## 2017-01-01 NOTE — ED Provider Notes (Signed)
[facility] DEPT Provider Note   CSN: [id number] Arrival date & time: 01/01/17  1539     History   Chief Complaint Chief Complaint  Patient presents with  . Abdominal Pain    HPI Brandi Arellano is a 61 y.o. female.  HPI Brandi Arellano is a 61 y.o. female with hx of CHF, DM, ESRD on dialysis, CAD, anemia, presents to ED with complaint of abdominal pain. States pain started about a week and a half ago. State pain is diffuse, worse in lower abdomen. Reports initially associated dysuria, urgency, frequency. Went to PCP 3 days ago and was told she had UTI and started on antibiotics states she has not been able to urinate in the last two days. Reports worsening lower abdominal pain and pressure. Denies fever or chills. No nausea or vomiting. No back pain. Last dyalisis was friday  Past Medical History:  Diagnosis Date  . Anemia   . CAD (coronary artery disease)    CABG  . CHF (congestive heart failure) (HCC)    Acute on chronic diastolic  . Complication of anesthesia   . Diabetes mellitus without complication (HCC)    Type II  . DVT (deep venous thrombosis) ([place])   . ESRD (end stage renal disease) ([place])    HD M-W-F  . Gangrene of lower extremity (HCC)    RLE  . GERD (gastroesophageal reflux disease)   . Hx of AKA (above knee amputation), right ([place])   . Hyperlipidemia   . Hypertension   . Hypothyroidism   . PONV (postoperative nausea and vomiting)   . Stroke [facility])    "light stroke" no deficits  . Thrombocytopenia ([place])   . Thyroid disease    Abnormal Thyroid function Test  . UTI (lower urinary tract infection)     Patient Active Problem List   Diagnosis Date Noted  . Axillary mass, left 07/01/2016  . Dupuytren's contracture of left hand 05/07/2016  . HLD (hyperlipidemia) 08/04/2015  . Conjunctivitis 08/04/2015  . Insomnia 07/09/2015  . Gangrene associated with diabetes mellitus ([place]) 03/20/2014  . Gastroparesis due to DM ([place]) 02/12/2014  . Status post above  knee amputation, right ([place]) 02/08/2014  . Buerger's disease ([place]) 02/08/2014  . Hyperparathyroidism, secondary renal ([place]) 02/06/2014  . Hyperphosphatemia 02/06/2014  . Anemia in chronic kidney disease 02/06/2014  . 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 Essential hypertension 12/26/2013  . DM type 2, uncontrolled, with renal complications ([place]) 40/98/1191  . CKD (chronic kidney disease) 12/26/2013  . Hypothyroidism 12/26/2013  . CAD (coronary artery disease) 12/26/2013  . End stage renal disease ([place]) 12/14/2013    Past Surgical History:  Procedure Laterality Date  . ABDOMINAL ANGIOGRAM  12/26/2013   Procedure: ABDOMINAL ANGIOGRAM;  Surgeon: Serafina Huntsman, MD;  Location: [facility] CATH LAB;  Service: Cardiovascular;;  . AMPUTATION Right 02/01/2014   Procedure: Right Below Knee Amputation;  Surgeon: Newt Minion, MD;  Location: [place];  Service: Orthopedics;  Laterality: Right;  . AMPUTATION Right 03/24/2014   Procedure: AMPUTATION ABOVE KNEE;  Surgeon: Newt Minion, MD;  Location: [place];  Service: Orthopedics;  Laterality: Right;  . ARCH AORTOGRAM  12/26/2013   Procedure: ARCH AORTOGRAM;  Surgeon: Serafina Froh, MD;  Location: [facility] CATH LAB;  Service: Cardiovascular;;  . CAROTID ENDARTERECTOMY     Bilateral  . CHOLECYSTECTOMY    . CORONARY ARTERY BYPASS GRAFT    . FOOT AMPUTATION Bilateral   . HARDWARE REMOVAL Right  07/19/2014   Procedure: Removal Deep Hardware Right Femur;  Surgeon: Newt Minion, MD;  Location: [place];  Service: Orthopedics;  Laterality: Right;  Six screws and one condylar plate removed   . hemodialysis catheter    . LOWER EXTREMITY ANGIOGRAM N/A 12/26/2013   Procedure: LOWER EXTREMITY ANGIOGRAM;  Surgeon: Serafina Lina, MD;  Location: [facility] CATH LAB;  Service: Cardiovascular;  Laterality: N/A;  . UPPER EXTREMITY ANGIOGRAM Right 12/26/2013   Procedure: UPPER EXTREMITY ANGIOGRAM;  Surgeon: Serafina Zentz, MD;  Location: [facility] CATH LAB;   Service: Cardiovascular;  Laterality: Right;    OB History    No data available       Home Medications    Prior to Admission medications   Medication Sig Start Date End Date Taking? Authorizing Provider  amLODipine (NORVASC) 5 MG tablet Take 5 mg by mouth daily.    [provider]  aspirin EC 81 MG tablet Take 81 mg by mouth 2 (two) times daily.     [provider]  atorvastatin (LIPITOR) 40 MG tablet Take 40 mg by mouth every evening.     [provider]  calcium acetate (PHOSLO) 667 MG capsule Take 667 mg by mouth 3 (three) times daily before meals.     [provider]  insulin aspart (NOVOLOG) 100 UNIT/ML injection Inject 5 Units into the skin. Check BS twice daily : If >150 inject 5 units    [provider]  lanthanum (FOSRENOL) 1000 MG chewable tablet Chew 1,000 mg by mouth See admin instructions. Pt to take 1 tablet with meals three times daily and 1 tablet with snacks    [provider]  levothyroxine (SYNTHROID, LEVOTHROID) 50 MCG tablet Take 50 mcg by mouth daily before breakfast.     [provider]    Family History Family History  Problem Relation Age of Onset  . Adopted: Yes  . Hypertension Father   . Heart disease Father        Coronary Artery Disease    Social History Social History  Substance Use Topics  . Smoking status: Former Smoker    Years: 15.00    Types: Cigarettes  . Smokeless tobacco: Never Used     Comment: quit in 2002  . Alcohol use No     Allergies   Diphenhydramine; Tramadol hcl; and Morphine and related   Review of Systems Review of Systems  Constitutional: Negative for chills and fever.  Respiratory: Negative for cough, chest tightness and shortness of breath.   Cardiovascular: Negative for chest pain, palpitations and leg swelling.  Gastrointestinal: Positive for abdominal pain. Negative for diarrhea, nausea and vomiting.  Genitourinary: Positive for difficulty  urinating, frequency and urgency. Negative for dysuria, flank pain, pelvic pain, vaginal bleeding, vaginal discharge and vaginal pain.  Musculoskeletal: Negative for arthralgias, myalgias, neck pain and neck stiffness.  Skin: Negative for rash.  Neurological: Negative for dizziness, weakness and headaches.  All other systems reviewed and are negative.    Physical Exam Updated Vital Signs BP (!) 160/112 (BP Location: Right Arm)   Pulse 100   Temp 98 F (36.7 C) (Oral)   Resp 20   Ht 5\' 6"  (1.676 m)   Wt 63.5 kg (140 lb)   SpO2 98%   BMI 22.60 kg/m   Physical Exam  Constitutional: She appears well-developed and well-nourished. No distress.  HENT:  Head: Normocephalic.  Eyes: Conjunctivae are normal.  Neck: Neck supple.  Cardiovascular: Normal rate, regular rhythm and  normal heart sounds.   Pulmonary/Chest: Effort normal and breath sounds normal. No respiratory distress. She has no wheezes. She has no rales.  Abdominal: Soft. Bowel sounds are normal. She exhibits distension. There is tenderness. There is no rebound.  Suprapubic tenderness. Bladder distended.   Musculoskeletal: She exhibits no edema.  Right BKA  Neurological: She is alert.  Skin: Skin is warm and dry.  Psychiatric: She has a normal mood and affect. Her behavior is normal.  Nursing note and vitals reviewed.    ED Treatments / Results  Labs (all labs ordered are listed, but only abnormal results are displayed) Labs Reviewed  LIPASE, BLOOD - Abnormal; Notable for the following:       Result Value   Lipase 295 (*)    All other components within normal limits  COMPREHENSIVE METABOLIC PANEL - Abnormal; Notable for the following:    Chloride 100 (*)    Glucose, Bld 187 (*)    BUN 36 (*)    Creatinine, Ser 5.49 (*)    GFR calc non Af Amer 8 (*)    GFR calc Af Amer 9 (*)    All other components within normal limits  CBC - Abnormal; Notable for the following:    MCV 100.5 (*)    RDW 18.2 (*)    Platelets  149 (*)    All other components within normal limits  URINALYSIS, ROUTINE W REFLEX MICROSCOPIC - Abnormal; Notable for the following:    Color, Urine AMBER (*)    APPearance CLOUDY (*)    Glucose, UA >=500 (*)    Hgb urine dipstick MODERATE (*)    Protein, ur >=300 (*)    Leukocytes, UA SMALL (*)    Bacteria, UA FEW (*)    Squamous Epithelial / LPF 6-30 (*)    Non Squamous Epithelial 0-5 (*)    All other components within normal limits  URINE CULTURE    EKG  EKG Interpretation None       Radiology Ct Abdomen Pelvis Wo Contrast  Result Date: 01/01/2017 CLINICAL DATA:  Intermittent lower abdominal pain. Urinary frequency with painful urination. EXAM: CT ABDOMEN AND PELVIS WITHOUT CONTRAST TECHNIQUE: Multidetector CT imaging of the abdomen and pelvis was performed following the standard protocol without IV contrast. COMPARISON:  None. FINDINGS: Lower chest: Atelectasis in the lung bases. Cardiac enlargement. Postoperative changes in the mediastinum likely representing bypass grafts. Hepatobiliary: Calcified granulomas in the liver. No focal liver abnormality is otherwise seen. No gallstones, gallbladder wall thickening, or biliary dilatation. Pancreas: Unremarkable. No pancreatic ductal dilatation or surrounding inflammatory changes. Spleen: Calcified granulomas in the spleen.  No splenomegaly. Adrenals/Urinary Tract: No adrenal gland nodules. Vascular calcifications demonstrated throughout both kidneys with calcification along both renal artery is and at both renal artery origins. Calcific stenosis of the renal arteries could be present. Kidneys are symmetrical in size. No hydronephrosis or hydroureter. No ureteral stones or bladder stones identified. The bladder is decompressed with a Foley catheter. There is a large amount of gas in the bladder which may have come from catheter insertion but can't exclude infection as cause. The bladder wall appears diffusely thickened and irregular, likely  indicating cystitis, but can't exclude polypoid bladder neoplasm. Stomach/Bowel: Stomach, small bowel, and colon are mostly decompressed. Scattered stool within the colon. No inflammatory changes are appreciated. Appendix is normal. Vascular/Lymphatic: Extensive vascular calcifications throughout the abdominal aorta and major branch vessels. The lower abdominal aorta and bilateral iliac arteries appear to be narrowed by calcifications. Prominent  calcification also demonstrated in the common femoral arteries bilaterally. Calcification throughout the celiac axis and scattered within the superior mesenteric artery. Areas of significant stenosis are likely. Reproductive: Prominent vascular calcification in around the uterus. Uterus and ovaries are not enlarged. Other: No free air or free fluid in the abdomen. Abdominal wall musculature appears intact. Musculoskeletal: No acute or significant osseous findings. IMPRESSION: 1. Thickened and irregular bladder wall with intraluminal gas. Changes likely represent cystitis. Can't exclude a polypoid bladder neoplasm. Follow-up recommended as clinically indicated. 2. Extensive aortic atherosclerosis and extensive vascular calcification throughout the abdomen. Areas of significant vascular stenosis are likely. 3. No renal or ureteral stone or obstruction. Electronically Signed   By: Lucienne Capers M.D.   On: 01/01/2017 21:48    Procedures Procedures (including critical care time)  Medications Ordered in ED Medications - No data to display   Initial Impression / Assessment and Plan / ED Course  I have reviewed the triage vital signs and the nursing notes.  Pertinent labs & imaging results that were available during my care of the patient were reviewed by me and considered in my medical decision making (see chart for details).     Patient with urinary retention, dysuria, hematuria, on antibiotics for 3 days. Bladder distended on exam. Postvoid residual showed 700  mL of urine. Will Pl., Foley catheter. Afebrile, otherwise non toxic appearing.   Pt feels much better after foley cath, pain resolved. Her lipase elevated, however, no longer having abdominal pain, nausea, vomiting. Doubt pancreatitis. Will get CT to rule out obstructing mass.   11:13 PM CT showing cystitis, no other acute findings. She was given Rocephin for her infection. Vital signs remained stable, she is hypertensive, otherwise no evidence of sepsis or systemic infection. She continues to feel much better with the Foley catheter. Urine culture was sent. Patient has been taking Cipro, will switch to Keflex. Offered admission given that she failed outpatient treatment, however patient really wanted to go home. I advised her to follow-up with her family doctor on Monday for recheck, follow up on cultures and sensitivities, and trial of Foley catheter removal. Patient agreed. Strict return precautions discussed. Will switch to [employer].  Vitals:   01/01/17 1546 01/01/17 1801 01/01/17 2100  BP: (!) 213/104 (!) 160/112 (!) 174/88  Pulse: (!) 102 100 87  Resp: 18 20 20   Temp: 98 F (36.7 C)    TempSrc: Oral    SpO2: 98% 98% 96%  Weight: 63.5 kg (140 lb)    Height: 5\' 6"  (1.676 m)       Final Clinical Impressions(s) / ED Diagnoses   Final diagnoses:  Lower urinary tract infectious disease  Urinary retention    New Prescriptions New Prescriptions   No medications on file     Jeannett Senior, Hershal Coria 01/01/17 2348    Duffy Bruce, MD 01/02/17 1113

## 2017-01-01 NOTE — ED Notes (Signed)
Upon discharge, this RN noticed urine on chux pad, asked pt if foley bag was leaking, pt stated that she was urinating during foley insertion and it was fine.

## 2017-01-03 LAB — URINE CULTURE: Culture: NO GROWTH

## 2017-01-09 ENCOUNTER — Emergency Department (HOSPITAL_COMMUNITY)
Admission: EM | Admit: 2017-01-09 | Discharge: 2017-01-09 | Disposition: A | Discharge status: Home / Self Care | Payer: Medicaid Other | Attending: Emergency Medicine | Admitting: Emergency Medicine

## 2017-01-09 ENCOUNTER — Encounter (HOSPITAL_COMMUNITY): Payer: Self-pay | Admitting: Emergency Medicine

## 2017-01-09 DIAGNOSIS — Z794 Long term (current) use of insulin: Secondary | ICD-10-CM | POA: Insufficient documentation

## 2017-01-09 DIAGNOSIS — E039 Hypothyroidism, unspecified: Secondary | ICD-10-CM | POA: Insufficient documentation

## 2017-01-09 DIAGNOSIS — R338 Other retention of urine: Secondary | ICD-10-CM

## 2017-01-09 DIAGNOSIS — E1122 Type 2 diabetes mellitus with diabetic chronic kidney disease: Secondary | ICD-10-CM | POA: Insufficient documentation

## 2017-01-09 DIAGNOSIS — Z89611 Acquired absence of right leg above knee: Secondary | ICD-10-CM | POA: Insufficient documentation

## 2017-01-09 DIAGNOSIS — N186 End stage renal disease: Secondary | ICD-10-CM | POA: Insufficient documentation

## 2017-01-09 DIAGNOSIS — Z87891 Personal history of nicotine dependence: Secondary | ICD-10-CM | POA: Insufficient documentation

## 2017-01-09 DIAGNOSIS — Z992 Dependence on renal dialysis: Secondary | ICD-10-CM | POA: Diagnosis not present

## 2017-01-09 DIAGNOSIS — I509 Heart failure, unspecified: Secondary | ICD-10-CM | POA: Diagnosis not present

## 2017-01-09 DIAGNOSIS — I132 Hypertensive heart and chronic kidney disease with heart failure and with stage 5 chronic kidney disease, or end stage renal disease: Secondary | ICD-10-CM | POA: Insufficient documentation

## 2017-01-09 DIAGNOSIS — I2510 Atherosclerotic heart disease of native coronary artery without angina pectoris: Secondary | ICD-10-CM | POA: Insufficient documentation

## 2017-01-09 DIAGNOSIS — R1030 Lower abdominal pain, unspecified: Secondary | ICD-10-CM | POA: Diagnosis present

## 2017-01-09 DIAGNOSIS — Z8673 Personal history of transient ischemic attack (TIA), and cerebral infarction without residual deficits: Secondary | ICD-10-CM | POA: Insufficient documentation

## 2017-01-09 LAB — URINALYSIS, ROUTINE W REFLEX MICROSCOPIC
BILIRUBIN URINE: NEGATIVE
Ketones, ur: NEGATIVE mg/dL
NITRITE: NEGATIVE
PH: 7 (ref 5.0–8.0)
Protein, ur: >=300 mg/dL — AB
SPECIFIC GRAVITY, URINE: 1.012 (ref 1.005–1.030)

## 2017-01-09 NOTE — Discharge Instructions (Signed)
Leave the catheter in place until you can be evaluated by [facility] to see why you are having difficulty urinating. Call their office tomorrow to get an appointment. Recheck if you get a fever, vomiting or the catheter stops draining.

## 2017-01-09 NOTE — ED Notes (Signed)
Pt c/o worsening lower mid abd pain, pt states she feels as if she has to urinate but is unable.  Pt states she had a foley in place for retention removed Friday, pt states she does continue to have burning with any urination.

## 2017-01-09 NOTE — ED Provider Notes (Signed)
[facility] DEPT Provider Note   CSN: [id number] Arrival date & time: 01/09/17  0450  Time seen 05:50 AM   History   Chief Complaint Chief Complaint  Patient presents with  . Abdominal Pain    HPI Brandi Arellano is a 61 y.o. female.  HPI    patient is hard to get history from however after reviewing her old charts patient was seen by her PCP roughly September 20 and diagnosed a UTI. She was started on an antibiotic which she is still taking. She was seen in the ED again on September 22 when she had urinary retention and she was discharged home with a Foley catheter. She also had a CT scan done showing cystitis. She was given Rocephin in the ED. She was switched from Cipro to Keflex. She states she had her catheter removed on September 28. She states she has not been able to urinate since 11:30 yesterday. She states she's having a burning pressure pain in her lower abdomen and she has a strong urge to urinate but can't. She states she does not have nausea, vomiting, or fever. States she never had difficulty urinating before.  Patient has a history of end-stage renal disease and gets dialysis on Monday, Wednesday, and Friday, 2 days ago.  PCP Kerin Perna, NP Nephrology Dr Janace Litten    Past Medical History:  Diagnosis Date  . Anemia   . CAD (coronary artery disease)    CABG  . CHF (congestive heart failure) (HCC)    Acute on chronic diastolic  . Complication of anesthesia   . Diabetes mellitus without complication (HCC)    Type II  . DVT (deep venous thrombosis) ([place])   . ESRD (end stage renal disease) ([place])    HD M-W-F  . Gangrene of lower extremity (HCC)    RLE  . GERD (gastroesophageal reflux disease)   . Hx of AKA (above knee amputation), right ([place])   . Hyperlipidemia   . Hypertension   . Hypothyroidism   . PONV (postoperative nausea and vomiting)   . Stroke [facility])    "light stroke" no deficits  . Thrombocytopenia ([place])   . Thyroid disease    Abnormal  Thyroid function Test  . UTI (lower urinary tract infection)     Patient Active Problem List   Diagnosis Date Noted  . Axillary mass, left 07/01/2016  . Dupuytren's contracture of left hand 05/07/2016  . HLD (hyperlipidemia) 08/04/2015  . Conjunctivitis 08/04/2015  . Insomnia 07/09/2015  . Gangrene associated with diabetes mellitus ([place]) 03/20/2014  . Gastroparesis due to DM ([place]) 02/12/2014  . Status post above knee amputation, right ([place]) 02/08/2014  . Buerger's disease ([place]) 02/08/2014  . Hyperparathyroidism, secondary renal ([place]) 02/06/2014  . Hyperphosphatemia 02/06/2014  . Anemia in chronic kidney disease 02/06/2014  . 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 Essential hypertension 12/26/2013  . DM type 2, uncontrolled, with renal complications ([place]) 76/73/4193  . CKD (chronic kidney disease) 12/26/2013  . Hypothyroidism 12/26/2013  . CAD (coronary artery disease) 12/26/2013  . End stage renal disease ([place]) 12/14/2013    Past Surgical History:  Procedure Laterality Date  . ABDOMINAL ANGIOGRAM  12/26/2013   Procedure: ABDOMINAL ANGIOGRAM;  Surgeon: Serafina Pheasant, MD;  Location: [facility] CATH LAB;  Service: Cardiovascular;;  . AMPUTATION Right 02/01/2014   Procedure: Right Below Knee Amputation;  Surgeon: Newt Minion, MD;  Location: [place];  Service: Orthopedics;  Laterality: Right;  . AMPUTATION  Right 03/24/2014   Procedure: AMPUTATION ABOVE KNEE;  Surgeon: Newt Minion, MD;  Location: [place];  Service: Orthopedics;  Laterality: Right;  . ARCH AORTOGRAM  12/26/2013   Procedure: ARCH AORTOGRAM;  Surgeon: Serafina Moragne, MD;  Location[facility] LAB;  Service: Cardiovascular;;  . CAROTID ENDARTERECTOMY     Bilateral  . CHOLECYSTECTOMY    . CORONARY ARTERY BYPASS GRAFT    . FOOT AMPUTATION Bilateral   . HARDWARE REMOVAL Right 07/19/2014   Procedure: Removal Deep Hardware Right Femur;  Surgeon: Newt Minion, MD;  Location: [place];  Service:  Orthopedics;  Laterality: Right;  Six screws and one condylar plate removed   . hemodialysis catheter    . LOWER EXTREMITY ANGIOGRAM N/A 12/26/2013   Procedure: LOWER EXTREMITY ANGIOGRAM;  Surgeon: Serafina Bolduc, MD;  Location: [facility] CATH LAB;  Service: Cardiovascular;  Laterality: N/A;  . UPPER EXTREMITY ANGIOGRAM Right 12/26/2013   Procedure: UPPER EXTREMITY ANGIOGRAM;  Surgeon: Serafina Boulter, MD;  Location: [facility] CATH LAB;  Service: Cardiovascular;  Laterality: Right;    OB History    No data available       Home Medications    Prior to Admission medications   Medication Sig Start Date End Date Taking? Authorizing Provider  amLODipine (NORVASC) 5 MG tablet Take 5 mg by mouth daily.   Yes [provider]  aspirin EC 81 MG tablet Take 81 mg by mouth 2 (two) times daily.    Yes [provider]  atorvastatin (LIPITOR) 40 MG tablet Take 40 mg by mouth every evening.    Yes [provider]  cefpodoxime (VANTIN) 200 MG tablet Take 1 tab PO after dialysis for 4 doses Patient taking differently: Take 200 mg by mouth every 12 (twelve) hours. after dialysis 01/01/17  Yes Kirichenko, Tatyana, PA-C  ciprofloxacin (CIPRO) 500 MG tablet Take 500 mg by mouth 2 (two) times daily. For 14 days started 12/29/16   Yes [provider]  insulin aspart (NOVOLOG) 100 UNIT/ML injection Inject 5 Units into the skin. Check BS twice daily : If >150 inject 5 units   Yes [provider]  lanthanum (FOSRENOL) 1000 MG chewable tablet Chew 1,000 mg by mouth See admin instructions. Pt to take 1 tablet with meals three times daily and 1 tablet with snacks   Yes [provider]  levothyroxine (SYNTHROID, LEVOTHROID) 25 MCG tablet Take 25 mcg by mouth daily before breakfast.   Yes [provider]    Family History Family History  Problem Relation Age of Onset  . Adopted: Yes  . Hypertension Father   . Heart disease Father        Coronary Artery Disease     Social History Social History  Substance Use Topics  . Smoking status: Former Smoker    Years: 15.00    Types: Cigarettes  . Smokeless tobacco: Never Used     Comment: quit in 2002  . Alcohol use No     Allergies   Diphenhydramine; Tramadol hcl; and Morphine and related   Review of Systems Review of Systems  All other systems reviewed and are negative.    Physical Exam Updated Vital Signs BP (!) 163/83   Pulse 73   Temp 98.2 F (36.8 C) (Oral)   Resp 16   SpO2 99%   Physical Exam  Constitutional: She is oriented to person, place, and time. She appears well-developed and well-nourished.  Non-toxic appearance. She does not appear ill. No  distress.  HENT:  Head: Normocephalic and atraumatic.  Right Ear: External ear normal.  Left Ear: External ear normal.  Nose: Nose normal. No mucosal edema or rhinorrhea.  Mouth/Throat: Oropharynx is clear and moist and mucous membranes are normal. No dental abscesses or uvula swelling.  Eyes: Pupils are equal, round, and reactive to light. Conjunctivae and EOM are normal.  Neck: Normal range of motion and full passive range of motion without pain. Neck supple.  Cardiovascular: Normal rate, regular rhythm and normal heart sounds.  Exam reveals no gallop and no friction rub.   No murmur heard. Pulmonary/Chest: Effort normal and breath sounds normal. No respiratory distress. She has no wheezes. She has no rhonchi. She has no rales. She exhibits no tenderness and no crepitus.  Pt has dialysis access in her right subclavian.   Abdominal: Soft. Normal appearance and bowel sounds are normal. She exhibits no distension. There is no tenderness. There is no rebound and no guarding.  Pt examined after foley catheter placed. She has about 300 cc of cloudy urine in the catheter.  Musculoskeletal: Normal range of motion. She exhibits no edema or tenderness.  Moves all extremities well.   Neurological: She is alert and oriented to person,  place, and time. She has normal strength. No cranial nerve deficit.  Skin: Skin is warm, dry and intact. No rash noted. No erythema. No pallor.  Psychiatric: She has a normal mood and affect. Her speech is normal and behavior is normal. Her mood appears not anxious.  Nursing note and vitals reviewed.    ED Treatments / Results  Labs (all labs ordered are listed, but only abnormal results are displayed) Results for orders placed or performed during the hospital encounter of 01/09/17  Urinalysis, Routine w reflex microscopic  Result Value Ref Range   Color, Urine YELLOW YELLOW   APPearance CLOUDY (A) CLEAR   Specific Gravity, Urine 1.012 1.005 - 1.030   pH 7.0 5.0 - 8.0   Glucose, UA >=500 (A) NEGATIVE mg/dL   Hgb urine dipstick SMALL (A) NEGATIVE   Bilirubin Urine NEGATIVE NEGATIVE   Ketones, ur NEGATIVE NEGATIVE mg/dL   Protein, ur >=300 (A) NEGATIVE mg/dL   Nitrite NEGATIVE NEGATIVE   Leukocytes, UA MODERATE (A) NEGATIVE   RBC / HPF 6-30 0 - 5 RBC/hpf   WBC, UA 6-30 0 - 5 WBC/hpf   Bacteria, UA MANY (A) NONE SEEN   Squamous Epithelial / LPF 6-30 (A) NONE SEEN   Amorphous Crystal PRESENT    Non Squamous Epithelial 0-5 (A) NONE SEEN    Laboratory interpretation all normal except Proteinuria and glucosuria as expected with renal disease, a lot of epithelial cells and 6-30 red blood cells and 6-30 white blood cells which is much improved from her last urinalysis.    EKG  EKG Interpretation None       Radiology No results found.   Ct Abdomen Pelvis Wo Contrast  Result Date: 01/01/2017 CLINICAL DATA:  Intermittent lower abdominal pain. Urinary frequency with painful urination. . IMPRESSION: 1. Thickened and irregular bladder wall with intraluminal gas. Changes likely represent cystitis. Can't exclude a polypoid bladder neoplasm. Follow-up recommended as clinically indicated. 2. Extensive aortic atherosclerosis and extensive vascular calcification throughout the abdomen.  Areas of significant vascular stenosis are likely. 3. No renal or ureteral stone or obstruction. Electronically Signed   By: Lucienne Capers M.D.   On: 01/01/2017 21:48   Procedures Procedures (including critical care time)  Medications Ordered in ED Medications -  No data to display   Initial Impression / Assessment and Plan / ED Course  I have reviewed the triage vital signs and the nursing notes.  Pertinent labs & imaging results that were available during my care of the patient were reviewed by me and considered in my medical decision making (see chart for details).     Bladder scan showed 335 mL urine. Foley catheter was placed. Urine culture from September 22 showed no growth. Interestingly though her urinalysis showed too numerous to count red blood cells and too numerous to count white blood cells. The culture may have been negative because she had been on antibiotics for a couple of days.Urinalysis was sent today.  7:15 AM we discussed her urinalysis results. Her urine infection appears to be much improved, she has much less red blood cells and white blood cells and her nitrates are negative. We discussed finishing antibiotics that she has. We also discussed she needs follow up with urology to see why she's having difficulty urinating. She may have some type of stricture of her urethra that could be causing her difficulty. She denies being on any new medications or taking any over-the-counter medications such as antihistamines or cold medications. She was discharged home with a Foley catheter and the phone number for [facility] to get a follow-up appointment.   Final Clinical Impressions(s) / ED Diagnoses   Final diagnoses:  Acute urinary retention   Plan discharge  Rolland Porter, MD, Barbette Or, MD 01/09/17 [phone]

## 2017-01-09 NOTE — ED Triage Notes (Signed)
Pt transported from home via [facility] for c/o low abd pain, pt reported not able to urinate since 1500 yesterday. Pt had indwelling foley removed Friday. Currently on antbx for UTI HD M<W<F, full treatment Friday.

## 2017-01-09 NOTE — ED Notes (Signed)
Attached leg bag to pt's foley. Pt ready for discharge

## 2017-01-17 ENCOUNTER — Encounter (HOSPITAL_COMMUNITY): Payer: Self-pay | Admitting: Nurse Practitioner

## 2017-01-17 ENCOUNTER — Emergency Department (HOSPITAL_COMMUNITY)
Admission: EM | Admit: 2017-01-17 | Discharge: 2017-01-17 | Disposition: A | Discharge status: Home / Self Care | Payer: Medicaid Other | Attending: Emergency Medicine | Admitting: Emergency Medicine

## 2017-01-17 DIAGNOSIS — Z9049 Acquired absence of other specified parts of digestive tract: Secondary | ICD-10-CM | POA: Diagnosis not present

## 2017-01-17 DIAGNOSIS — E039 Hypothyroidism, unspecified: Secondary | ICD-10-CM | POA: Insufficient documentation

## 2017-01-17 DIAGNOSIS — I132 Hypertensive heart and chronic kidney disease with heart failure and with stage 5 chronic kidney disease, or end stage renal disease: Secondary | ICD-10-CM | POA: Insufficient documentation

## 2017-01-17 DIAGNOSIS — Z992 Dependence on renal dialysis: Secondary | ICD-10-CM | POA: Insufficient documentation

## 2017-01-17 DIAGNOSIS — Z79899 Other long term (current) drug therapy: Secondary | ICD-10-CM | POA: Insufficient documentation

## 2017-01-17 DIAGNOSIS — Z87891 Personal history of nicotine dependence: Secondary | ICD-10-CM | POA: Insufficient documentation

## 2017-01-17 DIAGNOSIS — I2510 Atherosclerotic heart disease of native coronary artery without angina pectoris: Secondary | ICD-10-CM | POA: Diagnosis not present

## 2017-01-17 DIAGNOSIS — I5033 Acute on chronic diastolic (congestive) heart failure: Secondary | ICD-10-CM | POA: Diagnosis not present

## 2017-01-17 DIAGNOSIS — R300 Dysuria: Secondary | ICD-10-CM | POA: Diagnosis present

## 2017-01-17 DIAGNOSIS — N3000 Acute cystitis without hematuria: Secondary | ICD-10-CM | POA: Diagnosis not present

## 2017-01-17 DIAGNOSIS — Z951 Presence of aortocoronary bypass graft: Secondary | ICD-10-CM | POA: Diagnosis not present

## 2017-01-17 DIAGNOSIS — Y829 Unspecified medical devices associated with adverse incidents: Secondary | ICD-10-CM | POA: Insufficient documentation

## 2017-01-17 DIAGNOSIS — Z794 Long term (current) use of insulin: Secondary | ICD-10-CM | POA: Insufficient documentation

## 2017-01-17 DIAGNOSIS — E1122 Type 2 diabetes mellitus with diabetic chronic kidney disease: Secondary | ICD-10-CM | POA: Diagnosis not present

## 2017-01-17 DIAGNOSIS — N186 End stage renal disease: Secondary | ICD-10-CM | POA: Diagnosis not present

## 2017-01-17 DIAGNOSIS — T83098A Other mechanical complication of other indwelling urethral catheter, initial encounter: Secondary | ICD-10-CM | POA: Insufficient documentation

## 2017-01-17 DIAGNOSIS — Z7982 Long term (current) use of aspirin: Secondary | ICD-10-CM | POA: Diagnosis not present

## 2017-01-17 DIAGNOSIS — T839XXA Unspecified complication of genitourinary prosthetic device, implant and graft, initial encounter: Secondary | ICD-10-CM

## 2017-01-17 LAB — URINALYSIS, MICROSCOPIC (REFLEX)

## 2017-01-17 LAB — URINALYSIS, ROUTINE W REFLEX MICROSCOPIC
Bilirubin Urine: NEGATIVE
GLUCOSE, UA: NEGATIVE mg/dL
Ketones, ur: NEGATIVE mg/dL
Nitrite: NEGATIVE
PH: 8 (ref 5.0–8.0)
Protein, ur: 100 mg/dL — AB
Specific Gravity, Urine: 1.025 (ref 1.005–1.030)

## 2017-01-17 MED ORDER — CEPHALEXIN 500 MG PO CAPS: 500.0000 mg | ORAL_CAPSULE | Freq: Two times a day (BID) | ORAL | 0 refills | Status: AC

## 2017-01-17 MED ORDER — CEPHALEXIN 500 MG PO CAPS
500.0000 mg | ORAL_CAPSULE | Freq: Once | ORAL | Status: AC
  Administered 2017-01-17: 500 mg via ORAL
  Filled 2017-01-17: qty 1

## 2017-01-17 NOTE — ED Provider Notes (Signed)
[facility] DEPT Provider Note   CSN: [id number] Arrival date & time: 01/17/17  1630     History   Chief Complaint No chief complaint on file.   HPI Brandi Arellano is a 61 y.o. female.  The history is provided by the patient and medical records.  Dysuria   This is a new problem. The current episode started 12 to 24 hours ago. The problem occurs every urination. The problem has not changed since onset.The quality of the pain is described as burning. The pain is moderate. There has been no fever. Pertinent negatives include no chills, no sweats, no nausea, no vomiting, no discharge, no frequency, no possible pregnancy, no urgency and no flank pain. She has tried nothing for the symptoms. Her past medical history is significant for recurrent UTIs and catheterization.    Past Medical History:  Diagnosis Date  . Anemia   . CAD (coronary artery disease)    CABG  . CHF (congestive heart failure) (HCC)    Acute on chronic diastolic  . Complication of anesthesia   . Diabetes mellitus without complication (HCC)    Type II  . DVT (deep venous thrombosis) ([place])   . ESRD (end stage renal disease) ([place])    HD M-W-F  . Gangrene of lower extremity (HCC)    RLE  . GERD (gastroesophageal reflux disease)   . Hx of AKA (above knee amputation), right ([place])   . Hyperlipidemia   . Hypertension   . Hypothyroidism   . PONV (postoperative nausea and vomiting)   . Stroke [facility])    "light stroke" no deficits  . Thrombocytopenia ([place])   . Thyroid disease    Abnormal Thyroid function Test  . UTI (lower urinary tract infection)     Patient Active Problem List   Diagnosis Date Noted  . Axillary mass, left 07/01/2016  . Dupuytren's contracture of left hand 05/07/2016  . HLD (hyperlipidemia) 08/04/2015  . Conjunctivitis 08/04/2015  . Insomnia 07/09/2015  . Gangrene associated with diabetes mellitus ([place]) 03/20/2014  . Gastroparesis due to DM ([place]) 02/12/2014  . Status post above knee  amputation, right ([place]) 02/08/2014  . Buerger's disease ([place]) 02/08/2014  . Hyperparathyroidism, secondary renal ([place]) 02/06/2014  . Hyperphosphatemia 02/06/2014  . Anemia in chronic kidney disease 02/06/2014  . 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 Essential hypertension 12/26/2013  . DM type 2, uncontrolled, with renal complications ([place]) 19/14/7829  . CKD (chronic kidney disease) 12/26/2013  . Hypothyroidism 12/26/2013  . CAD (coronary artery disease) 12/26/2013  . End stage renal disease ([place]) 12/14/2013    Past Surgical History:  Procedure Laterality Date  . ABDOMINAL ANGIOGRAM  12/26/2013   Procedure: ABDOMINAL ANGIOGRAM;  Surgeon: Serafina Cranston, MD;  Location: [facility] CATH LAB;  Service: Cardiovascular;;  . AMPUTATION Right 02/01/2014   Procedure: Right Below Knee Amputation;  Surgeon: Newt Minion, MD;  Location: [place];  Service: Orthopedics;  Laterality: Right;  . AMPUTATION Right 03/24/2014   Procedure: AMPUTATION ABOVE KNEE;  Surgeon: Newt Minion, MD;  Location: [place];  Service: Orthopedics;  Laterality: Right;  . ARCH AORTOGRAM  12/26/2013   Procedure: ARCH AORTOGRAM;  Surgeon: Serafina Huesca, MD;  Location: [facility] CATH LAB;  Service: Cardiovascular;;  . CAROTID ENDARTERECTOMY     Bilateral  . CHOLECYSTECTOMY    . CORONARY ARTERY BYPASS GRAFT    . FOOT AMPUTATION Bilateral   . HARDWARE REMOVAL Right 07/19/2014   Procedure: Removal Deep  Hardware Right Femur;  Surgeon: Newt Minion, MD;  Location: [place];  Service: Orthopedics;  Laterality: Right;  Six screws and one condylar plate removed   . hemodialysis catheter    . LOWER EXTREMITY ANGIOGRAM N/A 12/26/2013   Procedure: LOWER EXTREMITY ANGIOGRAM;  Surgeon: Serafina Tibbitts, MD;  Location: [facility] CATH LAB;  Service: Cardiovascular;  Laterality: N/A;  . UPPER EXTREMITY ANGIOGRAM Right 12/26/2013   Procedure: UPPER EXTREMITY ANGIOGRAM;  Surgeon: Serafina Tangonan, MD;  Location: [facility] CATH LAB;  Service:  Cardiovascular;  Laterality: Right;    OB History    No data available       Home Medications    Prior to Admission medications   Medication Sig Start Date End Date Taking? Authorizing Provider  amLODipine (NORVASC) 5 MG tablet Take 5 mg by mouth daily.   Yes [provider]  aspirin EC 81 MG tablet Take 81 mg by mouth 2 (two) times daily.    Yes [provider]  atorvastatin (LIPITOR) 40 MG tablet Take 40 mg by mouth every evening.    Yes [provider]  cefpodoxime (VANTIN) 200 MG tablet Take 1 tab PO after dialysis for 4 doses Patient taking differently: Take 200 mg by mouth every 12 (twelve) hours. after dialysis 01/01/17  Yes Kirichenko, Tatyana, PA-C  insulin aspart (NOVOLOG) 100 UNIT/ML injection Inject 5 Units into the skin. Check BS twice daily : If >150 inject 5 units   Yes [provider]  lanthanum (FOSRENOL) 1000 MG chewable tablet Chew 1,000 mg by mouth See admin instructions. Pt to take 1 tablet with meals three times daily and 1 tablet with snacks   Yes [provider]  levothyroxine (SYNTHROID, LEVOTHROID) 25 MCG tablet Take 25 mcg by mouth daily before breakfast.   Yes [provider]    Family History Family History  Problem Relation Age of Onset  . Adopted: Yes  . Hypertension Father   . Heart disease Father        Coronary Artery Disease    Social History Social History  Substance Use Topics  . Smoking status: Former Smoker    Years: 15.00    Types: Cigarettes  . Smokeless tobacco: Never Used     Comment: quit in 2002  . Alcohol use No     Allergies   Diphenhydramine; Tramadol hcl; and Morphine and related   Review of Systems Review of Systems  Constitutional: Negative for chills, diaphoresis, fatigue and fever.  HENT: Negative for congestion.   Respiratory: Negative for cough, chest tightness, shortness of breath, wheezing and stridor.   Cardiovascular: Negative for chest pain.    Gastrointestinal: Negative for abdominal pain, constipation, diarrhea, nausea and vomiting.  Genitourinary: Positive for decreased urine volume, difficulty urinating and dysuria. Negative for flank pain, frequency and urgency.  Musculoskeletal: Negative for back pain, neck pain and neck stiffness.  Skin: Negative for rash and wound.  Neurological: Negative for weakness, light-headedness, numbness and headaches.  Psychiatric/Behavioral: Negative for agitation.  All other systems reviewed and are negative.    Physical Exam Updated Vital Signs Ht 5\' 6"  (1.676 m)   Wt 63.5 kg (140 lb)   BMI 22.60 kg/m   Physical Exam  Constitutional: She is oriented to person, place, and time. She appears well-developed and well-nourished. No distress.  HENT:  Head: Normocephalic.  Nose: Nose normal.  Mouth/Throat: Oropharynx is clear and moist. No oropharyngeal exudate.  Eyes: Pupils are equal, round, and reactive to light. Conjunctivae  and EOM are normal.  Neck: Normal range of motion.  Cardiovascular: Normal rate and intact distal pulses.   No murmur heard. Pulmonary/Chest: Effort normal. No stridor. No respiratory distress. She has no rales. She exhibits no tenderness.  Abdominal: Soft. There is no tenderness. There is no rigidity, no rebound, no guarding and no CVA tenderness.  Foley catheter present.   Musculoskeletal: She exhibits no edema or tenderness.  Neurological: She is alert and oriented to person, place, and time.  Skin: No rash noted. She is not diaphoretic. No erythema.  Psychiatric: She has a normal mood and affect.  Nursing note and vitals reviewed.    ED Treatments / Results  Labs (all labs ordered are listed, but only abnormal results are displayed) Labs Reviewed  URINALYSIS, ROUTINE W REFLEX MICROSCOPIC - Abnormal; Notable for the following:       Result Value   APPearance TURBID (*)    Hgb urine dipstick SMALL (*)    Protein, ur 100 (*)    Leukocytes, UA SMALL (*)     All other components within normal limits  URINALYSIS, MICROSCOPIC (REFLEX) - Abnormal; Notable for the following:    Bacteria, UA MANY (*)    Squamous Epithelial / LPF TOO NUMEROUS TO COUNT (*)    All other components within normal limits  URINE CULTURE    EKG  EKG Interpretation None       Radiology No results found.  Procedures Procedures (including critical care time)  Medications Ordered in ED Medications  cephALEXin (KEFLEX) capsule 500 mg (500 mg Oral Given 01/17/17 2251)     Initial Impression / Assessment and Plan / ED Course  I have reviewed the triage vital signs and the nursing notes.  Pertinent labs & imaging results that were available during my care of the patient were reviewed by me and considered in my medical decision making (see chart for details).     Zane Samson is a 61 y.o. female with a past medical history significant for hypertension, diabetes, CHF, hypothyroidism, ESRD, CAD with CABG, and prior stroke who presents with Foley catheter problem and dysuria. Patient reports that she has a Foley catheter chronically. She says that the Foley catheter became dislodged while in the shower. She says it has not been draining and she has been having draining coming around the site. She reports that it burns. She denies any fevers, chills, nausea, vomiting, flank pain. She denies any signs of systemic infection. She denies other complaints.  On exam, patient has a Foley catheter. Patient's abdomen is nontender. No CVA tenderness. Lungs clear. Patient has a right above-the-knee amputation and left toe amputations. Exam otherwise unremarkable.  Patient had fully manipulated by nursing and repositioned with subsequent urine flow in the catheter. Suspect it was partially dislodged. After this was fixed, urinalysis was obtained. Patient found to have foul-smelling urine with UTI.  Patient given dose of antibiotics and will be treated with antibiotics. Given  patient's Foley problem resolution, patient was felt stable for discharge home. Patient will be given antibiotic prescription and instructions to follow-up with PCP. Do not feel patient has pyelonephritis.Patient had no other questions or concerns and had no other problems. Patient discharged in good condition with resolution of presenting symptoms.       Final Clinical Impressions(s) / ED Diagnoses   Final diagnoses:  Problem with Foley catheter, initial encounter ([place])  Acute cystitis without hematuria   Clinical Impression: 1. Problem with Foley catheter, initial encounter ([place])  2. Acute cystitis without hematuria     Disposition: Discharge  Condition: Good  I have discussed the results, Dx and Tx plan with the pt(& family if present). He/she/they expressed understanding and agree(s) with the plan. Discharge instructions discussed at great length. Strict return precautions discussed and pt &/or family have verbalized understanding of the instructions. No further questions at time of discharge.    Discharge Medication List as of 01/17/2017 10:16 PM    START taking these medications   Details  cephALEXin (KEFLEX) 500 MG capsule Take 1 capsule (500 mg total) by mouth 2 (two) times daily., Starting Mon 01/17/2017, Until Mon 01/24/2017, Print        Follow Up: Kerin Perna, NP [place] [address] [phone]     [facility] DEPT [place] [phone]  If symptoms worsen     Jilian West, Gwenyth Allegra, MD 01/18/17 1655

## 2017-01-17 NOTE — ED Notes (Signed)
Bed: HO12 Expected date:  Expected time:  Means of arrival:  Comments: abd pain, foley cath issue

## 2017-01-17 NOTE — ED Triage Notes (Signed)
Patient has abd pain and dysuria Patient had a foley cath at home. Patient feels like the foley came dislodged foley is not draining and now having abd pain. Patient was diag with UTI about a week ago. Right DKA and Left foot amputation.

## 2017-01-17 NOTE — Discharge Instructions (Signed)
We were able to secure your Foley catheter so it is now draining. We found evidence of a UTI. We gave you 1 dose of antibiotics but you'll need to continue it for the next week. Please follow-up with your PCP for reassessment and further management. If any symptoms change or worsen, please return to the nearest emergency department.

## 2017-01-19 LAB — URINE CULTURE

## 2017-02-06 ENCOUNTER — Emergency Department (HOSPITAL_COMMUNITY)
Admission: EM | Admit: 2017-02-06 | Discharge: 2017-02-06 | Disposition: A | Discharge status: Home / Self Care | Payer: Medicaid Other | Source: Home / Self Care | Attending: Emergency Medicine | Admitting: Emergency Medicine

## 2017-02-06 ENCOUNTER — Emergency Department (HOSPITAL_COMMUNITY): Payer: Medicaid Other

## 2017-02-06 ENCOUNTER — Inpatient Hospital Stay (HOSPITAL_COMMUNITY)
Admission: EM | Admit: 2017-02-06 | Discharge: 2017-03-15 | DRG: 987 | Disposition: A | Discharge status: Home / Self Care | Payer: Medicaid Other | Attending: Urology | Admitting: Urology

## 2017-02-06 ENCOUNTER — Encounter (HOSPITAL_COMMUNITY): Payer: Self-pay | Admitting: Emergency Medicine

## 2017-02-06 DIAGNOSIS — E1151 Type 2 diabetes mellitus with diabetic peripheral angiopathy without gangrene: Secondary | ICD-10-CM | POA: Diagnosis present

## 2017-02-06 DIAGNOSIS — N2581 Secondary hyperparathyroidism of renal origin: Secondary | ICD-10-CM | POA: Diagnosis present

## 2017-02-06 DIAGNOSIS — C675 Malignant neoplasm of bladder neck: Secondary | ICD-10-CM | POA: Diagnosis present

## 2017-02-06 DIAGNOSIS — E1143 Type 2 diabetes mellitus with diabetic autonomic (poly)neuropathy: Secondary | ICD-10-CM | POA: Diagnosis present

## 2017-02-06 DIAGNOSIS — F4320 Adjustment disorder, unspecified: Secondary | ICD-10-CM | POA: Diagnosis present

## 2017-02-06 DIAGNOSIS — I350 Nonrheumatic aortic (valve) stenosis: Secondary | ICD-10-CM | POA: Diagnosis present

## 2017-02-06 DIAGNOSIS — R1084 Generalized abdominal pain: Secondary | ICD-10-CM

## 2017-02-06 DIAGNOSIS — R402432 Glasgow coma scale score 3-8, at arrival to emergency department: Secondary | ICD-10-CM | POA: Diagnosis present

## 2017-02-06 DIAGNOSIS — Z7189 Other specified counseling: Secondary | ICD-10-CM

## 2017-02-06 DIAGNOSIS — I208 Other forms of angina pectoris: Secondary | ICD-10-CM | POA: Diagnosis present

## 2017-02-06 DIAGNOSIS — R1011 Right upper quadrant pain: Secondary | ICD-10-CM

## 2017-02-06 DIAGNOSIS — I10 Essential (primary) hypertension: Secondary | ICD-10-CM

## 2017-02-06 DIAGNOSIS — I2582 Chronic total occlusion of coronary artery: Secondary | ICD-10-CM | POA: Diagnosis present

## 2017-02-06 DIAGNOSIS — R64 Cachexia: Secondary | ICD-10-CM | POA: Diagnosis present

## 2017-02-06 DIAGNOSIS — T83511A Infection and inflammatory reaction due to indwelling urethral catheter, initial encounter: Secondary | ICD-10-CM

## 2017-02-06 DIAGNOSIS — Z79890 Hormone replacement therapy (postmenopausal): Secondary | ICD-10-CM

## 2017-02-06 DIAGNOSIS — R112 Nausea with vomiting, unspecified: Secondary | ICD-10-CM

## 2017-02-06 DIAGNOSIS — Z87891 Personal history of nicotine dependence: Secondary | ICD-10-CM

## 2017-02-06 DIAGNOSIS — Z992 Dependence on renal dialysis: Secondary | ICD-10-CM

## 2017-02-06 DIAGNOSIS — D62 Acute posthemorrhagic anemia: Secondary | ICD-10-CM

## 2017-02-06 DIAGNOSIS — Z515 Encounter for palliative care: Secondary | ICD-10-CM

## 2017-02-06 DIAGNOSIS — R338 Other retention of urine: Secondary | ICD-10-CM | POA: Diagnosis present

## 2017-02-06 DIAGNOSIS — R9439 Abnormal result of other cardiovascular function study: Secondary | ICD-10-CM

## 2017-02-06 DIAGNOSIS — Z419 Encounter for procedure for purposes other than remedying health state, unspecified: Secondary | ICD-10-CM

## 2017-02-06 DIAGNOSIS — Z89611 Acquired absence of right leg above knee: Secondary | ICD-10-CM

## 2017-02-06 DIAGNOSIS — R4182 Altered mental status, unspecified: Secondary | ICD-10-CM

## 2017-02-06 DIAGNOSIS — Z87440 Personal history of urinary (tract) infections: Secondary | ICD-10-CM

## 2017-02-06 DIAGNOSIS — Z681 Body mass index (BMI) 19 or less, adult: Secondary | ICD-10-CM

## 2017-02-06 DIAGNOSIS — E785 Hyperlipidemia, unspecified: Secondary | ICD-10-CM | POA: Diagnosis present

## 2017-02-06 DIAGNOSIS — Z95828 Presence of other vascular implants and grafts: Secondary | ICD-10-CM

## 2017-02-06 DIAGNOSIS — I731 Thromboangiitis obliterans [Buerger's disease]: Secondary | ICD-10-CM | POA: Diagnosis present

## 2017-02-06 DIAGNOSIS — M720 Palmar fascial fibromatosis [Dupuytren]: Secondary | ICD-10-CM | POA: Diagnosis present

## 2017-02-06 DIAGNOSIS — Z89422 Acquired absence of other left toe(s): Secondary | ICD-10-CM

## 2017-02-06 DIAGNOSIS — Z794 Long term (current) use of insulin: Secondary | ICD-10-CM

## 2017-02-06 DIAGNOSIS — Z8249 Family history of ischemic heart disease and other diseases of the circulatory system: Secondary | ICD-10-CM

## 2017-02-06 DIAGNOSIS — I25118 Atherosclerotic heart disease of native coronary artery with other forms of angina pectoris: Secondary | ICD-10-CM | POA: Diagnosis present

## 2017-02-06 DIAGNOSIS — I2584 Coronary atherosclerosis due to calcified coronary lesion: Secondary | ICD-10-CM | POA: Diagnosis present

## 2017-02-06 DIAGNOSIS — C679 Malignant neoplasm of bladder, unspecified: Secondary | ICD-10-CM | POA: Diagnosis present

## 2017-02-06 DIAGNOSIS — I132 Hypertensive heart and chronic kidney disease with heart failure and with stage 5 chronic kidney disease, or end stage renal disease: Secondary | ICD-10-CM | POA: Diagnosis present

## 2017-02-06 DIAGNOSIS — N390 Urinary tract infection, site not specified: Secondary | ICD-10-CM | POA: Diagnosis present

## 2017-02-06 DIAGNOSIS — G546 Phantom limb syndrome with pain: Secondary | ICD-10-CM | POA: Diagnosis present

## 2017-02-06 DIAGNOSIS — T82898A Other specified complication of vascular prosthetic devices, implants and grafts, initial encounter: Secondary | ICD-10-CM | POA: Diagnosis present

## 2017-02-06 DIAGNOSIS — Z9049 Acquired absence of other specified parts of digestive tract: Secondary | ICD-10-CM

## 2017-02-06 DIAGNOSIS — N186 End stage renal disease: Secondary | ICD-10-CM | POA: Diagnosis present

## 2017-02-06 DIAGNOSIS — Z79899 Other long term (current) drug therapy: Secondary | ICD-10-CM

## 2017-02-06 DIAGNOSIS — E1122 Type 2 diabetes mellitus with diabetic chronic kidney disease: Secondary | ICD-10-CM | POA: Diagnosis present

## 2017-02-06 DIAGNOSIS — Z7982 Long term (current) use of aspirin: Secondary | ICD-10-CM

## 2017-02-06 DIAGNOSIS — I5032 Chronic diastolic (congestive) heart failure: Secondary | ICD-10-CM | POA: Diagnosis present

## 2017-02-06 DIAGNOSIS — D631 Anemia in chronic kidney disease: Secondary | ICD-10-CM | POA: Diagnosis present

## 2017-02-06 DIAGNOSIS — K219 Gastro-esophageal reflux disease without esophagitis: Secondary | ICD-10-CM | POA: Diagnosis present

## 2017-02-06 DIAGNOSIS — Z8673 Personal history of transient ischemic attack (TIA), and cerebral infarction without residual deficits: Secondary | ICD-10-CM

## 2017-02-06 DIAGNOSIS — K5900 Constipation, unspecified: Secondary | ICD-10-CM | POA: Diagnosis present

## 2017-02-06 DIAGNOSIS — K3184 Gastroparesis: Secondary | ICD-10-CM | POA: Diagnosis present

## 2017-02-06 DIAGNOSIS — T402X1A Poisoning by other opioids, accidental (unintentional), initial encounter: Principal | ICD-10-CM | POA: Diagnosis present

## 2017-02-06 DIAGNOSIS — Z86718 Personal history of other venous thrombosis and embolism: Secondary | ICD-10-CM

## 2017-02-06 DIAGNOSIS — Z951 Presence of aortocoronary bypass graft: Secondary | ICD-10-CM

## 2017-02-06 DIAGNOSIS — R197 Diarrhea, unspecified: Principal | ICD-10-CM

## 2017-02-06 DIAGNOSIS — E039 Hypothyroidism, unspecified: Secondary | ICD-10-CM | POA: Diagnosis present

## 2017-02-06 DIAGNOSIS — R109 Unspecified abdominal pain: Secondary | ICD-10-CM

## 2017-02-06 DIAGNOSIS — E8889 Other specified metabolic disorders: Secondary | ICD-10-CM | POA: Diagnosis present

## 2017-02-06 DIAGNOSIS — E875 Hyperkalemia: Secondary | ICD-10-CM | POA: Diagnosis present

## 2017-02-06 DIAGNOSIS — Z885 Allergy status to narcotic agent status: Secondary | ICD-10-CM

## 2017-02-06 DIAGNOSIS — Z888 Allergy status to other drugs, medicaments and biological substances status: Secondary | ICD-10-CM

## 2017-02-06 HISTORY — DX: Other specified disorders of bladder: N32.89

## 2017-02-06 LAB — COMPREHENSIVE METABOLIC PANEL
ALBUMIN: 3.7 g/dL (ref 3.5–5.0)
ALK PHOS: 46 U/L (ref 38–126)
ALT: 12 U/L — AB (ref 14–54)
ALT: 17 U/L (ref 14–54)
ANION GAP: 20 — AB (ref 5–15)
AST: 18 U/L (ref 15–41)
AST: 33 U/L (ref 15–41)
Albumin: 3 g/dL — ABNORMAL LOW (ref 3.5–5.0)
Alkaline Phosphatase: 66 U/L (ref 38–126)
Anion gap: 13 (ref 5–15)
BILIRUBIN TOTAL: 1 mg/dL (ref 0.3–1.2)
BUN: 53 mg/dL — AB (ref 6–20)
BUN: 65 mg/dL — ABNORMAL HIGH (ref 6–20)
CALCIUM: 8.4 mg/dL — AB (ref 8.9–10.3)
CHLORIDE: 96 mmol/L — AB (ref 101–111)
CO2: 22 mmol/L (ref 22–32)
CO2: 19 mmol/L — AB (ref 22–32)
CREATININE: 7.9 mg/dL — AB (ref 0.44–1.00)
Calcium: 9.2 mg/dL (ref 8.9–10.3)
Chloride: 100 mmol/L — ABNORMAL LOW (ref 101–111)
Creatinine, Ser: 8.81 mg/dL — ABNORMAL HIGH (ref 0.44–1.00)
GFR calc non Af Amer: 4 mL/min — ABNORMAL LOW (ref >60)
GFR, EST AFRICAN AMERICAN: 6 mL/min — AB (ref >60)
GFR, EST AFRICAN AMERICAN: 5 mL/min — AB (ref >60)
GFR, EST NON AFRICAN AMERICAN: 5 mL/min — AB (ref >60)
GLUCOSE: 236 mg/dL — AB (ref 65–99)
Glucose, Bld: 198 mg/dL — ABNORMAL HIGH (ref 65–99)
Potassium: 4.4 mmol/L (ref 3.5–5.1)
Potassium: 4.4 mmol/L (ref 3.5–5.1)
Sodium: 135 mmol/L (ref 135–145)
Sodium: 135 mmol/L (ref 135–145)
TOTAL PROTEIN: 6.6 g/dL (ref 6.5–8.1)
Total Bilirubin: 1.2 mg/dL (ref 0.3–1.2)
Total Protein: 8.2 g/dL — ABNORMAL HIGH (ref 6.5–8.1)

## 2017-02-06 LAB — CBC WITH DIFFERENTIAL/PLATELET
BASOS PCT: 1 %
Basophils Absolute: 0.1 10*3/uL (ref 0.0–0.1)
EOS PCT: 1 %
Eosinophils Absolute: 0.1 10*3/uL (ref 0.0–0.7)
HEMATOCRIT: 41.1 % (ref 36.0–46.0)
Hemoglobin: 12.9 g/dL (ref 12.0–15.0)
LYMPHS PCT: 8 %
Lymphs Abs: 0.8 10*3/uL (ref 0.7–4.0)
MCH: 30.2 pg (ref 26.0–34.0)
MCHC: 31.4 g/dL (ref 30.0–36.0)
MCV: 96.3 fL (ref 78.0–100.0)
MONO ABS: 0.5 10*3/uL (ref 0.1–1.0)
MONOS PCT: 5 %
NEUTROS ABS: 8.9 10*3/uL — AB (ref 1.7–7.7)
Neutrophils Relative %: 85 %
Platelets: 163 10*3/uL (ref 150–400)
RBC: 4.27 MIL/uL (ref 3.87–5.11)
RDW: 16.3 % — AB (ref 11.5–15.5)
WBC: 10.3 10*3/uL (ref 4.0–10.5)

## 2017-02-06 LAB — I-STAT CHEM 8, ED
BUN: 56 mg/dL — AB (ref 6–20)
CHLORIDE: 101 mmol/L (ref 101–111)
CREATININE: 9.1 mg/dL — AB (ref 0.44–1.00)
Calcium, Ion: 1.01 mmol/L — ABNORMAL LOW (ref 1.15–1.40)
Glucose, Bld: 242 mg/dL — ABNORMAL HIGH (ref 65–99)
HEMATOCRIT: 45 % (ref 36.0–46.0)
Hemoglobin: 15.3 g/dL — ABNORMAL HIGH (ref 12.0–15.0)
POTASSIUM: 4.3 mmol/L (ref 3.5–5.1)
SODIUM: 137 mmol/L (ref 135–145)
TCO2: 22 mmol/L (ref 22–32)

## 2017-02-06 LAB — CBC
HCT: 34.9 % — ABNORMAL LOW (ref 36.0–46.0)
Hemoglobin: 11 g/dL — ABNORMAL LOW (ref 12.0–15.0)
MCH: 30.1 pg (ref 26.0–34.0)
MCHC: 31.5 g/dL (ref 30.0–36.0)
MCV: 95.4 fL (ref 78.0–100.0)
Platelets: 118 10*3/uL — ABNORMAL LOW (ref 150–400)
RBC: 3.66 MIL/uL — AB (ref 3.87–5.11)
RDW: 16.7 % — AB (ref 11.5–15.5)
WBC: 6 10*3/uL (ref 4.0–10.5)

## 2017-02-06 LAB — LIPASE, BLOOD: Lipase: 116 U/L — ABNORMAL HIGH (ref 11–51)

## 2017-02-06 LAB — I-STAT CG4 LACTIC ACID, ED: Lactic Acid, Venous: 1.44 mmol/L (ref 0.5–1.9)

## 2017-02-06 MED ORDER — ONDANSETRON 4 MG PO TBDP
ORAL_TABLET | ORAL | Status: AC
  Filled 2017-02-06: qty 1

## 2017-02-06 MED ORDER — ONDANSETRON 4 MG PO TBDP
4.0000 mg | ORAL_TABLET | Freq: Once | ORAL | Status: AC
  Administered 2017-02-06: 4 mg via ORAL
  Filled 2017-02-06: qty 1

## 2017-02-06 MED ORDER — NALOXONE HCL 2 MG/2ML IJ SOSY
1.0000 mg | PREFILLED_SYRINGE | Freq: Once | INTRAMUSCULAR | Status: AC
  Administered 2017-02-06: 1 mg via INTRAVENOUS

## 2017-02-06 MED ORDER — IOPAMIDOL (ISOVUE-300) INJECTION 61%
INTRAVENOUS | Status: AC
  Administered 2017-02-06: 100 mL
  Filled 2017-02-06: qty 100

## 2017-02-06 MED ORDER — IOPAMIDOL (ISOVUE-300) INJECTION 61%
INTRAVENOUS | Status: AC
  Filled 2017-02-06: qty 100

## 2017-02-06 MED ORDER — NALOXONE HCL 2 MG/2ML IJ SOSY
PREFILLED_SYRINGE | INTRAMUSCULAR | Status: AC
  Administered 2017-02-06: 1 mg
  Filled 2017-02-06: qty 2

## 2017-02-06 MED ORDER — ONDANSETRON HCL 4 MG/2ML IJ SOLN
INTRAMUSCULAR | Status: AC
  Filled 2017-02-06: qty 2

## 2017-02-06 MED ORDER — NALOXONE HCL 2 MG/2ML IJ SOSY
0.7000 mg/h | PREFILLED_SYRINGE | INTRAVENOUS | Status: DC
  Filled 2017-02-06: qty 4

## 2017-02-06 MED ORDER — KETOROLAC TROMETHAMINE 60 MG/2ML IM SOLN: 30.0000 mg | Freq: Once | INTRAMUSCULAR | Status: DC

## 2017-02-06 MED ORDER — SUCRALFATE 1 GM/10ML PO SUSP
1.0000 g | Freq: Three times a day (TID) | ORAL | Status: DC
  Filled 2017-02-06 (×2): qty 10

## 2017-02-06 MED ORDER — GABAPENTIN 100 MG PO CAPS: 100.0000 mg | ORAL_CAPSULE | Freq: Once | ORAL | Status: DC

## 2017-02-06 MED ORDER — ONDANSETRON HCL 4 MG/2ML IJ SOLN
4.0000 mg | Freq: Once | INTRAMUSCULAR | Status: AC
  Administered 2017-02-06: 4 mg via INTRAVENOUS

## 2017-02-06 MED ORDER — DICYCLOMINE HCL 10 MG/ML IM SOLN
20.0000 mg | Freq: Once | INTRAMUSCULAR | Status: AC
  Administered 2017-02-06: 20 mg via INTRAMUSCULAR
  Filled 2017-02-06: qty 2

## 2017-02-06 MED ORDER — METOCLOPRAMIDE HCL 5 MG/ML IJ SOLN
5.0000 mg | Freq: Once | INTRAMUSCULAR | Status: AC
  Administered 2017-02-06: 5 mg via INTRAVENOUS
  Filled 2017-02-06: qty 2

## 2017-02-06 NOTE — ED Provider Notes (Signed)
[facility] EMERGENCY DEPARTMENT Provider Note   CSN: [id number] Arrival date & time: 02/06/17  0143     History   Chief Complaint Chief Complaint  Patient presents with  . Abdominal Pain  . Nausea  . Diarrhea    HPI Brandi Arellano is a 61 y.o. female.  The history is provided by the patient.  Abdominal Pain   This is a new problem. The current episode started 12 to 24 hours ago. The problem occurs constantly. The problem has not changed since onset.The pain is associated with an unknown factor. The pain is located in the generalized abdominal region. The quality of the pain is cramping. The pain is mild. Associated symptoms include diarrhea, nausea and vomiting. Pertinent negatives include fever. Nothing aggravates the symptoms. Nothing relieves the symptoms. Past workup includes surgery. Her past medical history does not include ulcerative colitis.  Diarrhea   Associated symptoms include abdominal pain and vomiting.  Diarrhea started first at dialysis where multiple people were also sick, then nausea and cramping.  No f/cr.  No CP or sob  Past Medical History:  Diagnosis Date  . Anemia   . CAD (coronary artery disease)    CABG  . CHF (congestive heart failure) (HCC)    Acute on chronic diastolic  . Complication of anesthesia   . Diabetes mellitus without complication (HCC)    Type II  . DVT (deep venous thrombosis) ([place])   . ESRD (end stage renal disease) ([place])    HD M-W-F  . Gangrene of lower extremity (HCC)    RLE  . GERD (gastroesophageal reflux disease)   . Hx of AKA (above knee amputation), right ([place])   . Hyperlipidemia   . Hypertension   . Hypothyroidism   . PONV (postoperative nausea and vomiting)   . Stroke [facility])    "light stroke" no deficits  . Thrombocytopenia ([place])   . Thyroid disease    Abnormal Thyroid function Test  . UTI (lower urinary tract infection)     Patient Active Problem List   Diagnosis Date Noted  . Axillary  mass, left 07/01/2016  . Dupuytren's contracture of left hand 05/07/2016  . HLD (hyperlipidemia) 08/04/2015  . Conjunctivitis 08/04/2015  . Insomnia 07/09/2015  . Gangrene associated with diabetes mellitus ([place]) 03/20/2014  . Gastroparesis due to DM ([place]) 02/12/2014  . Status post above knee amputation, right ([place]) 02/08/2014  . Buerger's disease ([place]) 02/08/2014  . Hyperparathyroidism, secondary renal ([place]) 02/06/2014  . Hyperphosphatemia 02/06/2014  . Anemia in chronic kidney disease 02/06/2014  . 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 Essential hypertension 12/26/2013  . DM type 2, uncontrolled, with renal complications ([place]) 62/37/6283  . CKD (chronic kidney disease) 12/26/2013  . Hypothyroidism 12/26/2013  . CAD (coronary artery disease) 12/26/2013  . End stage renal disease ([place]) 12/14/2013    Past Surgical History:  Procedure Laterality Date  . ABDOMINAL ANGIOGRAM  12/26/2013   Procedure: ABDOMINAL ANGIOGRAM;  Surgeon: Serafina Pulice, MD;  Location: [facility] CATH LAB;  Service: Cardiovascular;;  . AMPUTATION Right 02/01/2014   Procedure: Right Below Knee Amputation;  Surgeon: Newt Minion, MD;  Location: [place];  Service: Orthopedics;  Laterality: Right;  . AMPUTATION Right 03/24/2014   Procedure: AMPUTATION ABOVE KNEE;  Surgeon: Newt Minion, MD;  Location: [place];  Service: Orthopedics;  Laterality: Right;  . ARCH AORTOGRAM  12/26/2013   Procedure: ARCH AORTOGRAM;  Surgeon: Serafina Steinhauser, MD;  Location: [place] CATH LAB;  Service: Cardiovascular;;  . CAROTID ENDARTERECTOMY     Bilateral  . CHOLECYSTECTOMY    . CORONARY ARTERY BYPASS GRAFT    . FOOT AMPUTATION Bilateral   . HARDWARE REMOVAL Right 07/19/2014   Procedure: Removal Deep Hardware Right Femur;  Surgeon: Newt Minion, MD;  Location: [place];  Service: Orthopedics;  Laterality: Right;  Six screws and one condylar plate removed   . hemodialysis catheter    . LOWER EXTREMITY ANGIOGRAM N/A  12/26/2013   Procedure: LOWER EXTREMITY ANGIOGRAM;  Surgeon: Serafina Vidana, MD;  Location: [facility] CATH LAB;  Service: Cardiovascular;  Laterality: N/A;  . UPPER EXTREMITY ANGIOGRAM Right 12/26/2013   Procedure: UPPER EXTREMITY ANGIOGRAM;  Surgeon: Serafina Marzette, MD;  Location: [facility] CATH LAB;  Service: Cardiovascular;  Laterality: Right;    OB History    No data available       Home Medications    Prior to Admission medications   Medication Sig Start Date End Date Taking? Authorizing Provider  amLODipine (NORVASC) 5 MG tablet Take 5 mg by mouth daily.    [provider]  aspirin EC 81 MG tablet Take 81 mg by mouth 2 (two) times daily.     [provider]  atorvastatin (LIPITOR) 40 MG tablet Take 40 mg by mouth every evening.     [provider]  cefpodoxime (VANTIN) 200 MG tablet Take 1 tab PO after dialysis for 4 doses Patient taking differently: Take 200 mg by mouth every 12 (twelve) hours. after dialysis 01/01/17   Jeannett Senior, PA-C  insulin aspart (NOVOLOG) 100 UNIT/ML injection Inject 5 Units into the skin. Check BS twice daily : If >150 inject 5 units    [provider]  lanthanum (FOSRENOL) 1000 MG chewable tablet Chew 1,000 mg by mouth See admin instructions. Pt to take 1 tablet with meals three times daily and 1 tablet with snacks    [provider]  levothyroxine (SYNTHROID, LEVOTHROID) 25 MCG tablet Take 25 mcg by mouth daily before breakfast.    [provider]    Family History Family History  Problem Relation Age of Onset  . Adopted: Yes  . Hypertension Father   . Heart disease Father        Coronary Artery Disease    Social History Social History  Substance Use Topics  . Smoking status: Former Smoker    Years: 15.00    Types: Cigarettes  . Smokeless tobacco: Never Used     Comment: quit in 2002  . Alcohol use No     Allergies   Diphenhydramine; Tramadol hcl; and Morphine and related   Review of  Systems Review of Systems  Constitutional: Negative for fever.  Respiratory: Negative for shortness of breath.   Cardiovascular: Negative for chest pain.  Gastrointestinal: Positive for abdominal pain, diarrhea, nausea and vomiting.  All other systems reviewed and are negative.    Physical Exam Updated Vital Signs BP 112/79 (BP Location: Right Arm)   Pulse 78   Temp 98.2 F (36.8 C) (Oral)   Resp 16   Ht 5\' 6"  (1.676 m)   Wt 63.5 kg (140 lb)   SpO2 94%   BMI 22.60 kg/m   Physical Exam  Constitutional: She is oriented to person, place, and time. She appears well-developed and well-nourished.  HENT:  Head: Normocephalic and atraumatic.  Eyes: Pupils are equal, round, and reactive to light. Conjunctivae are normal.  Neck: Normal range of motion.  Neck supple. No JVD present.  Cardiovascular: Normal rate, regular rhythm, normal heart sounds and intact distal pulses.   Pulmonary/Chest: Effort normal and breath sounds normal. She has no wheezes. She has no rales.  Abdominal: Soft. She exhibits no mass. Bowel sounds are increased. There is no tenderness. There is no rigidity, no rebound, no guarding, no tenderness at McBurney's point and negative Murphy's sign.  Musculoskeletal: Normal range of motion.  Neurological: She is alert and oriented to person, place, and time. She displays normal reflexes.  Skin: Skin is warm. Capillary refill takes less than 2 seconds.  Psychiatric: She has a normal mood and affect.     ED Treatments / Results  Labs (all labs ordered are listed, but only abnormal results are displayed) Results for orders placed or performed during the hospital encounter of 02/06/17  Lipase, blood  Result Value Ref Range   Lipase 116 (H) 11 - 51 U/L  Comprehensive metabolic panel  Result Value Ref Range   Sodium 135 135 - 145 mmol/L   Potassium 4.4 3.5 - 5.1 mmol/L   Chloride 100 (L) 101 - 111 mmol/L   CO2 22 22 - 32 mmol/L   Glucose, Bld 198 (H) 65 - 99 mg/dL    BUN 53 (H) 6 - 20 mg/dL   Creatinine, Ser 7.90 (H) 0.44 - 1.00 mg/dL   Calcium 8.4 (L) 8.9 - 10.3 mg/dL   Total Protein 6.6 6.5 - 8.1 g/dL   Albumin 3.0 (L) 3.5 - 5.0 g/dL   AST 18 15 - 41 U/L   ALT 12 (L) 14 - 54 U/L   Alkaline Phosphatase 46 38 - 126 U/L   Total Bilirubin 1.0 0.3 - 1.2 mg/dL   GFR calc non Af Amer 5 (L) >60 mL/min   GFR calc Af Amer 6 (L) >60 mL/min   Anion gap 13 5 - 15  CBC  Result Value Ref Range   WBC 6.0 4.0 - 10.5 K/uL   RBC 3.66 (L) 3.87 - 5.11 MIL/uL   Hemoglobin 11.0 (L) 12.0 - 15.0 g/dL   HCT 34.9 (L) 36.0 - 46.0 %   MCV 95.4 78.0 - 100.0 fL   MCH 30.1 26.0 - 34.0 pg   MCHC 31.5 30.0 - 36.0 g/dL   RDW 16.7 (H) 11.5 - 15.5 %   Platelets 118 (L) 150 - 400 K/uL   Dg Abd Acute W/chest  Result Date: 02/06/2017 CLINICAL DATA:  Abdominal pain for 2 hours. Nausea and diarrhea. History of end-stage renal disease on dialysis. EXAM: DG ABDOMEN ACUTE W/ 1V CHEST COMPARISON:  Chest radiograph November 11, 2016 an CT abdomen and pelvis November 11, 2016 FINDINGS: Cardiac silhouette is mildly enlarged and unchanged. Calcified aortic knob. Status post median sternotomy for CABG. Vascular stent projects at aortic arch. Diffuse interstitial prominence slightly worse than prior radiograph without pleural effusion or focal consolidation. Apical pleural thickening. No pneumothorax. Surgical clips in the neck consistent with history of carotid endarterectomy. Vascular clips LEFT arm. Tunneled dialysis catheter via RIGHT internal jugular venous approach with distal tip projecting cavoatrial junction. Bowel gas pattern is nondilated and nonobstructive. Overall paucity of bowel gas. Extensive vascular calcifications is seen on prior CT. Surgical clips in the included right abdomen compatible with cholecystectomy. No intra-abdominal mass effect. No intraperitoneal free air. Soft tissue planes included osseous structures are nonacute. IMPRESSION: Stable cardiomegaly.  Worsening interstitial  edema. Nonspecific bowel gas pattern. Electronically Signed   By: Elon Alas M.D.   On: 02/06/2017  04:33   Procedures (including critical care time)  Medications Ordered in ED Medications  sucralfate (CARAFATE) 1 GM/10ML suspension 1 g (not administered)  ondansetron (ZOFRAN-ODT) 4 MG disintegrating tablet (not administered)  dicyclomine (BENTYL) injection 20 mg (20 mg Intramuscular Given 02/06/17 0310)  ondansetron (ZOFRAN-ODT) disintegrating tablet 4 mg (4 mg Oral Given 02/06/17 0310)     Initial Impression / Assessment and Plan / ED Course  I have reviewed the triage vital signs and the nursing notes.    Final Clinical Impressions(s) / ED Diagnoses  Eloped during work up.    Veeda Virgo, MD 02/06/17 [phone]
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
## 2017-02-06 NOTE — ED Notes (Signed)
Call from [facility] notifying this RN both IV's are no longer functioning and had to be removed.
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
## 2017-02-06 NOTE — ED Triage Notes (Signed)
Pt BIB [facility] for abdominal pain that started 2 hours PTA. Pt c/o nausea and diarrhea. The pain is periumbilical. Pt is a dialysis MWF and has not missed any treatments

## 2017-02-06 NOTE — ED Notes (Signed)
Pt heaving, no emesis noted. Pt responsive to verbal stimuli

## 2017-02-06 NOTE — ED Triage Notes (Signed)
Pt presents from home after being found by family bleeding from severed permacath to R subclavian. On arrival, no active bleeding noted, tip of cath clamped with hemostats.  Pt's sports bra removed by ED staff

## 2017-02-06 NOTE — ED Notes (Signed)
Pt became more responsive after Narcan 1mg  x 2

## 2017-02-06 NOTE — ED Notes (Signed)
Pt awake c/o abd pain

## 2017-02-06 NOTE — ED Notes (Signed)
Pt requested something for her neuropathy. RN asked MD for medications and order received for gabapentin. RN went to tell patient and she stated that she already takes that and it doesn't help. RN asked patient what helped and she did not know. RN spoke to MD and she placed a different order. RN went to tell patient. She refused and stated that she cannot set here anymore and was ready to leave. RN asked about a way home. She called a ride and was helped to the lobby

## 2017-02-06 NOTE — ED Notes (Signed)
CT aware patient has new IV established

## 2017-02-06 NOTE — Progress Notes (Signed)
Patient difficult to arouse of arrival. Patient has shallow respirations on arrival. RR is 12-14/min. Patient placed on NRB for oxygenation support.

## 2017-02-06 NOTE — ED Notes (Signed)
IV established using ultrasound.  Needle visualized in the vein free from the wall.  Blood return noted and flushed without difficulty.  Patient tolerated procedure well.   
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
## 2017-02-06 NOTE — Progress Notes (Signed)
Patient had 45 mls Isovue 300 extravasation left antecubital. Dr Quintella Reichert assessed patient, IV was removed, and ice placed at site. Verbal handoff given to TN, Autumn and extravasation orders placed.

## 2017-02-06 NOTE — ED Notes (Signed)
Pt had one episode of vomiting. RN informed MD and orders received. RN went to administer Medication and patient stated that was making her more nauseous.

## 2017-02-07 ENCOUNTER — Encounter (HOSPITAL_COMMUNITY)
Admission: EM | Disposition: A | Discharge status: Home / Self Care | Payer: Self-pay | Source: Home / Self Care | Attending: Family Medicine

## 2017-02-07 ENCOUNTER — Inpatient Hospital Stay (HOSPITAL_COMMUNITY): Payer: Medicaid Other | Admitting: Certified Registered Nurse Anesthetist

## 2017-02-07 ENCOUNTER — Inpatient Hospital Stay (HOSPITAL_COMMUNITY): Payer: Medicaid Other

## 2017-02-07 ENCOUNTER — Encounter (HOSPITAL_COMMUNITY): Payer: Self-pay | Admitting: *Deleted

## 2017-02-07 DIAGNOSIS — Z794 Long term (current) use of insulin: Secondary | ICD-10-CM | POA: Diagnosis not present

## 2017-02-07 DIAGNOSIS — D631 Anemia in chronic kidney disease: Secondary | ICD-10-CM | POA: Diagnosis present

## 2017-02-07 DIAGNOSIS — R4182 Altered mental status, unspecified: Secondary | ICD-10-CM | POA: Diagnosis present

## 2017-02-07 DIAGNOSIS — T83511D Infection and inflammatory reaction due to indwelling urethral catheter, subsequent encounter: Secondary | ICD-10-CM | POA: Diagnosis not present

## 2017-02-07 DIAGNOSIS — E8889 Other specified metabolic disorders: Secondary | ICD-10-CM | POA: Diagnosis present

## 2017-02-07 DIAGNOSIS — E1143 Type 2 diabetes mellitus with diabetic autonomic (poly)neuropathy: Secondary | ICD-10-CM | POA: Diagnosis present

## 2017-02-07 DIAGNOSIS — I350 Nonrheumatic aortic (valve) stenosis: Secondary | ICD-10-CM | POA: Diagnosis not present

## 2017-02-07 DIAGNOSIS — R1011 Right upper quadrant pain: Secondary | ICD-10-CM

## 2017-02-07 DIAGNOSIS — R112 Nausea with vomiting, unspecified: Secondary | ICD-10-CM

## 2017-02-07 DIAGNOSIS — I208 Other forms of angina pectoris: Secondary | ICD-10-CM | POA: Diagnosis not present

## 2017-02-07 DIAGNOSIS — I2583 Coronary atherosclerosis due to lipid rich plaque: Secondary | ICD-10-CM | POA: Diagnosis not present

## 2017-02-07 DIAGNOSIS — N3289 Other specified disorders of bladder: Secondary | ICD-10-CM | POA: Diagnosis not present

## 2017-02-07 DIAGNOSIS — Z7189 Other specified counseling: Secondary | ICD-10-CM | POA: Diagnosis not present

## 2017-02-07 DIAGNOSIS — R64 Cachexia: Secondary | ICD-10-CM | POA: Diagnosis present

## 2017-02-07 DIAGNOSIS — I5030 Unspecified diastolic (congestive) heart failure: Secondary | ICD-10-CM | POA: Diagnosis not present

## 2017-02-07 DIAGNOSIS — Z87891 Personal history of nicotine dependence: Secondary | ICD-10-CM | POA: Diagnosis not present

## 2017-02-07 DIAGNOSIS — Z89611 Acquired absence of right leg above knee: Secondary | ICD-10-CM | POA: Diagnosis not present

## 2017-02-07 DIAGNOSIS — E785 Hyperlipidemia, unspecified: Secondary | ICD-10-CM | POA: Diagnosis present

## 2017-02-07 DIAGNOSIS — R079 Chest pain, unspecified: Secondary | ICD-10-CM | POA: Diagnosis not present

## 2017-02-07 DIAGNOSIS — F4320 Adjustment disorder, unspecified: Secondary | ICD-10-CM | POA: Diagnosis not present

## 2017-02-07 DIAGNOSIS — K3184 Gastroparesis: Secondary | ICD-10-CM | POA: Diagnosis present

## 2017-02-07 DIAGNOSIS — Z681 Body mass index (BMI) 19 or less, adult: Secondary | ICD-10-CM | POA: Diagnosis not present

## 2017-02-07 DIAGNOSIS — Z419 Encounter for procedure for purposes other than remedying health state, unspecified: Secondary | ICD-10-CM | POA: Diagnosis not present

## 2017-02-07 DIAGNOSIS — T402X1A Poisoning by other opioids, accidental (unintentional), initial encounter: Secondary | ICD-10-CM | POA: Diagnosis present

## 2017-02-07 DIAGNOSIS — Z992 Dependence on renal dialysis: Secondary | ICD-10-CM

## 2017-02-07 DIAGNOSIS — E875 Hyperkalemia: Secondary | ICD-10-CM | POA: Diagnosis present

## 2017-02-07 DIAGNOSIS — N186 End stage renal disease: Secondary | ICD-10-CM | POA: Diagnosis present

## 2017-02-07 DIAGNOSIS — I731 Thromboangiitis obliterans [Buerger's disease]: Secondary | ICD-10-CM | POA: Diagnosis present

## 2017-02-07 DIAGNOSIS — N390 Urinary tract infection, site not specified: Secondary | ICD-10-CM | POA: Diagnosis present

## 2017-02-07 DIAGNOSIS — Z515 Encounter for palliative care: Secondary | ICD-10-CM | POA: Diagnosis not present

## 2017-02-07 DIAGNOSIS — D494 Neoplasm of unspecified behavior of bladder: Secondary | ICD-10-CM | POA: Diagnosis not present

## 2017-02-07 DIAGNOSIS — I25118 Atherosclerotic heart disease of native coronary artery with other forms of angina pectoris: Secondary | ICD-10-CM | POA: Diagnosis present

## 2017-02-07 DIAGNOSIS — G546 Phantom limb syndrome with pain: Secondary | ICD-10-CM | POA: Diagnosis present

## 2017-02-07 DIAGNOSIS — I132 Hypertensive heart and chronic kidney disease with heart failure and with stage 5 chronic kidney disease, or end stage renal disease: Secondary | ICD-10-CM | POA: Diagnosis present

## 2017-02-07 DIAGNOSIS — E1151 Type 2 diabetes mellitus with diabetic peripheral angiopathy without gangrene: Secondary | ICD-10-CM | POA: Diagnosis present

## 2017-02-07 DIAGNOSIS — I5032 Chronic diastolic (congestive) heart failure: Secondary | ICD-10-CM | POA: Diagnosis present

## 2017-02-07 DIAGNOSIS — Z01810 Encounter for preprocedural cardiovascular examination: Secondary | ICD-10-CM | POA: Diagnosis not present

## 2017-02-07 DIAGNOSIS — C675 Malignant neoplasm of bladder neck: Secondary | ICD-10-CM | POA: Diagnosis present

## 2017-02-07 DIAGNOSIS — E1122 Type 2 diabetes mellitus with diabetic chronic kidney disease: Secondary | ICD-10-CM | POA: Diagnosis present

## 2017-02-07 DIAGNOSIS — E119 Type 2 diabetes mellitus without complications: Secondary | ICD-10-CM | POA: Diagnosis not present

## 2017-02-07 DIAGNOSIS — C679 Malignant neoplasm of bladder, unspecified: Secondary | ICD-10-CM | POA: Diagnosis not present

## 2017-02-07 DIAGNOSIS — R1084 Generalized abdominal pain: Secondary | ICD-10-CM

## 2017-02-07 DIAGNOSIS — I2510 Atherosclerotic heart disease of native coronary artery without angina pectoris: Secondary | ICD-10-CM | POA: Diagnosis not present

## 2017-02-07 DIAGNOSIS — Z95828 Presence of other vascular implants and grafts: Secondary | ICD-10-CM | POA: Diagnosis not present

## 2017-02-07 DIAGNOSIS — K219 Gastro-esophageal reflux disease without esophagitis: Secondary | ICD-10-CM | POA: Diagnosis present

## 2017-02-07 DIAGNOSIS — Z01818 Encounter for other preprocedural examination: Secondary | ICD-10-CM | POA: Diagnosis not present

## 2017-02-07 DIAGNOSIS — T82898A Other specified complication of vascular prosthetic devices, implants and grafts, initial encounter: Secondary | ICD-10-CM | POA: Diagnosis present

## 2017-02-07 DIAGNOSIS — I10 Essential (primary) hypertension: Secondary | ICD-10-CM | POA: Diagnosis not present

## 2017-02-07 DIAGNOSIS — T83511S Infection and inflammatory reaction due to indwelling urethral catheter, sequela: Secondary | ICD-10-CM | POA: Diagnosis not present

## 2017-02-07 DIAGNOSIS — R9439 Abnormal result of other cardiovascular function study: Secondary | ICD-10-CM | POA: Diagnosis not present

## 2017-02-07 DIAGNOSIS — N2581 Secondary hyperparathyroidism of renal origin: Secondary | ICD-10-CM | POA: Diagnosis present

## 2017-02-07 DIAGNOSIS — E039 Hypothyroidism, unspecified: Secondary | ICD-10-CM | POA: Diagnosis present

## 2017-02-07 HISTORY — PX: INSERTION OF DIALYSIS CATHETER: SHX1324

## 2017-02-07 LAB — URINALYSIS, ROUTINE W REFLEX MICROSCOPIC
BILIRUBIN URINE: NEGATIVE
Ketones, ur: 5 mg/dL — AB
NITRITE: NEGATIVE
SPECIFIC GRAVITY, URINE: 1.02 (ref 1.005–1.030)
pH: 8 (ref 5.0–8.0)

## 2017-02-07 LAB — RAPID URINE DRUG SCREEN, HOSP PERFORMED
Amphetamines: NOT DETECTED
BARBITURATES: NOT DETECTED
Benzodiazepines: NOT DETECTED
Cocaine: NOT DETECTED
Opiates: NOT DETECTED
TETRAHYDROCANNABINOL: NOT DETECTED

## 2017-02-07 LAB — LIPASE, BLOOD: Lipase: 66 U/L — ABNORMAL HIGH (ref 11–51)

## 2017-02-07 LAB — BASIC METABOLIC PANEL
ANION GAP: 19 — AB (ref 5–15)
BUN: 68 mg/dL — ABNORMAL HIGH (ref 6–20)
CHLORIDE: 100 mmol/L — AB (ref 101–111)
CO2: 17 mmol/L — AB (ref 22–32)
Calcium: 8.4 mg/dL — ABNORMAL LOW (ref 8.9–10.3)
Creatinine, Ser: 8.9 mg/dL — ABNORMAL HIGH (ref 0.44–1.00)
GFR calc non Af Amer: 4 mL/min — ABNORMAL LOW (ref >60)
GFR, EST AFRICAN AMERICAN: 5 mL/min — AB (ref >60)
Glucose, Bld: 237 mg/dL — ABNORMAL HIGH (ref 65–99)
Potassium: 4.8 mmol/L (ref 3.5–5.1)
Sodium: 136 mmol/L (ref 135–145)

## 2017-02-07 LAB — CBC
HEMATOCRIT: 40.2 % (ref 36.0–46.0)
HEMOGLOBIN: 12.4 g/dL (ref 12.0–15.0)
MCH: 29.6 pg (ref 26.0–34.0)
MCHC: 30.8 g/dL (ref 30.0–36.0)
MCV: 95.9 fL (ref 78.0–100.0)
Platelets: 154 10*3/uL (ref 150–400)
RBC: 4.19 MIL/uL (ref 3.87–5.11)
RDW: 16.7 % — ABNORMAL HIGH (ref 11.5–15.5)
WBC: 8.3 10*3/uL (ref 4.0–10.5)

## 2017-02-07 LAB — HEMOGLOBIN A1C
Hgb A1c MFr Bld: 6.8 % — ABNORMAL HIGH (ref 4.8–5.6)
Mean Plasma Glucose: 148.46 mg/dL

## 2017-02-07 LAB — GLUCOSE, CAPILLARY
GLUCOSE-CAPILLARY: 147 mg/dL — AB (ref 65–99)
GLUCOSE-CAPILLARY: 165 mg/dL — AB (ref 65–99)
GLUCOSE-CAPILLARY: 170 mg/dL — AB (ref 65–99)
GLUCOSE-CAPILLARY: 240 mg/dL — AB (ref 65–99)
GLUCOSE-CAPILLARY: 251 mg/dL — AB (ref 65–99)
Glucose-Capillary: 119 mg/dL — ABNORMAL HIGH (ref 65–99)
Glucose-Capillary: 148 mg/dL — ABNORMAL HIGH (ref 65–99)

## 2017-02-07 LAB — PROTIME-INR
INR: 1.12
Prothrombin Time: 14.3 seconds (ref 11.4–15.2)

## 2017-02-07 LAB — MRSA PCR SCREENING: MRSA by PCR: NEGATIVE

## 2017-02-07 SURGERY — INSERTION OF DIALYSIS CATHETER
Anesthesia: General | Site: Chest | Laterality: Right

## 2017-02-07 MED ORDER — POLYETHYLENE GLYCOL 3350 17 G PO PACK
17.0000 g | PACK | Freq: Two times a day (BID) | ORAL | Status: DC
  Administered 2017-02-08: 17 g via ORAL
  Filled 2017-02-07 (×2): qty 1

## 2017-02-07 MED ORDER — SODIUM CHLORIDE 0.9 % IV SOLN
INTRAVENOUS | Status: DC
  Administered 2017-02-07: 13:00:00 via INTRAVENOUS

## 2017-02-07 MED ORDER — ATORVASTATIN CALCIUM 40 MG PO TABS
40.0000 mg | ORAL_TABLET | Freq: Every evening | ORAL | Status: DC
  Administered 2017-02-07 – 2017-03-14 (×36): 40 mg via ORAL
  Filled 2017-02-07 (×36): qty 1

## 2017-02-07 MED ORDER — DEXTROSE 5 % IV SOLN
2.0000 g | Freq: Once | INTRAVENOUS | Status: AC
  Administered 2017-02-07: 2 g via INTRAVENOUS
  Filled 2017-02-07: qty 2

## 2017-02-07 MED ORDER — GABAPENTIN 250 MG/5ML PO SOLN: 100.0000 mg | Freq: Three times a day (TID) | ORAL | Status: DC

## 2017-02-07 MED ORDER — SODIUM CHLORIDE 0.9% FLUSH
3.0000 mL | Freq: Two times a day (BID) | INTRAVENOUS | Status: DC
  Administered 2017-02-07 – 2017-03-15 (×58): 3 mL via INTRAVENOUS

## 2017-02-07 MED ORDER — FENTANYL CITRATE (PF) 250 MCG/5ML IJ SOLN
INTRAMUSCULAR | Status: AC
  Filled 2017-02-07: qty 5

## 2017-02-07 MED ORDER — CYCLOBENZAPRINE HCL 5 MG PO TABS
5.0000 mg | ORAL_TABLET | Freq: Once | ORAL | Status: AC
  Administered 2017-02-07: 5 mg via ORAL
  Filled 2017-02-07: qty 1

## 2017-02-07 MED ORDER — MIDAZOLAM HCL 2 MG/2ML IJ SOLN
INTRAMUSCULAR | Status: DC | PRN
  Administered 2017-02-07: 2 mg via INTRAVENOUS

## 2017-02-07 MED ORDER — DEXTROSE 5 % IV SOLN
2.0000 g | INTRAVENOUS | Status: DC
  Administered 2017-02-07: 2 g via INTRAVENOUS
  Filled 2017-02-07: qty 2

## 2017-02-07 MED ORDER — CHLORHEXIDINE GLUCONATE 0.12 % MT SOLN
15.0000 mL | Freq: Two times a day (BID) | OROMUCOSAL | Status: DC
  Administered 2017-02-07 – 2017-03-15 (×57): 15 mL via OROMUCOSAL
  Filled 2017-02-07 (×65): qty 15

## 2017-02-07 MED ORDER — SODIUM CHLORIDE 0.9 % IV SOLN: 100.0000 mL | INTRAVENOUS | Status: DC | PRN

## 2017-02-07 MED ORDER — MIDAZOLAM HCL 2 MG/2ML IJ SOLN
INTRAMUSCULAR | Status: AC
  Filled 2017-02-07: qty 2

## 2017-02-07 MED ORDER — LIDOCAINE HCL (PF) 1 % IJ SOLN
5.0000 mL | INTRAMUSCULAR | Status: DC | PRN
Start: 2017-02-07 — End: 2017-02-08

## 2017-02-07 MED ORDER — ONDANSETRON HCL 4 MG/2ML IJ SOLN
INTRAMUSCULAR | Status: DC | PRN
  Administered 2017-02-07: 4 mg via INTRAVENOUS

## 2017-02-07 MED ORDER — PENTAFLUOROPROP-TETRAFLUOROETH EX AERO: 1.0000 "application " | INHALATION_SPRAY | CUTANEOUS | Status: DC | PRN

## 2017-02-07 MED ORDER — ACETAMINOPHEN 325 MG PO TABS
325.0000 mg | ORAL_TABLET | Freq: Once | ORAL | Status: AC
  Administered 2017-02-07: 325 mg via ORAL
  Filled 2017-02-07: qty 1

## 2017-02-07 MED ORDER — HEPARIN SODIUM (PORCINE) 5000 UNIT/ML IJ SOLN
INTRAMUSCULAR | Status: DC | PRN
  Administered 2017-02-07: 14:00:00

## 2017-02-07 MED ORDER — LABETALOL HCL 5 MG/ML IV SOLN
5.0000 mg | Freq: Once | INTRAVENOUS | Status: AC
  Administered 2017-02-07: 5 mg via INTRAVENOUS
  Filled 2017-02-07: qty 4

## 2017-02-07 MED ORDER — ALTEPLASE 2 MG IJ SOLR: 2.0000 mg | Freq: Once | INTRAMUSCULAR | Status: DC | PRN

## 2017-02-07 MED ORDER — HEPARIN SODIUM (PORCINE) 5000 UNIT/ML IJ SOLN
5000.0000 [IU] | Freq: Three times a day (TID) | INTRAMUSCULAR | Status: DC
  Administered 2017-02-07: 5000 [IU] via SUBCUTANEOUS
  Filled 2017-02-07: qty 1

## 2017-02-07 MED ORDER — ADULT MULTIVITAMIN W/MINERALS CH
1.0000 | ORAL_TABLET | Freq: Every day | ORAL | Status: DC
  Administered 2017-02-07 – 2017-03-15 (×23): 1 via ORAL
  Filled 2017-02-07 (×33): qty 1

## 2017-02-07 MED ORDER — FLUMAZENIL 0.5 MG/5ML IV SOLN
INTRAVENOUS | Status: AC
  Filled 2017-02-07: qty 5

## 2017-02-07 MED ORDER — LIDOCAINE 2% (20 MG/ML) 5 ML SYRINGE
INTRAMUSCULAR | Status: AC
  Filled 2017-02-07: qty 5

## 2017-02-07 MED ORDER — ACETAMINOPHEN 325 MG PO TABS
650.0000 mg | ORAL_TABLET | Freq: Four times a day (QID) | ORAL | Status: DC | PRN
Start: 2017-02-07 — End: 2017-03-15
  Administered 2017-02-07 – 2017-03-14 (×61): 650 mg via ORAL
  Filled 2017-02-07 (×61): qty 2

## 2017-02-07 MED ORDER — DOCUSATE SODIUM 100 MG PO CAPS
100.0000 mg | ORAL_CAPSULE | Freq: Every day | ORAL | Status: DC
  Administered 2017-02-08 – 2017-03-15 (×14): 100 mg via ORAL
  Filled 2017-02-07 (×31): qty 1

## 2017-02-07 MED ORDER — HEPARIN SODIUM (PORCINE) 5000 UNIT/ML IJ SOLN
5000.0000 [IU] | Freq: Three times a day (TID) | INTRAMUSCULAR | Status: DC
  Administered 2017-02-08 – 2017-02-11 (×8): 5000 [IU] via SUBCUTANEOUS
  Filled 2017-02-07 (×9): qty 1

## 2017-02-07 MED ORDER — DEXAMETHASONE SODIUM PHOSPHATE 10 MG/ML IJ SOLN
INTRAMUSCULAR | Status: DC | PRN
  Administered 2017-02-07: 10 mg via INTRAVENOUS

## 2017-02-07 MED ORDER — DOXERCALCIFEROL 4 MCG/2ML IV SOLN
4.0000 ug | INTRAVENOUS | Status: DC
  Administered 2017-02-08 – 2017-02-16 (×5): 4 ug via INTRAVENOUS
  Filled 2017-02-07 (×7): qty 2

## 2017-02-07 MED ORDER — ACETAMINOPHEN 650 MG RE SUPP
650.0000 mg | Freq: Four times a day (QID) | RECTAL | Status: DC | PRN
Start: 2017-02-07 — End: 2017-03-15

## 2017-02-07 MED ORDER — GABAPENTIN 250 MG/5ML PO SOLN
100.0000 mg | Freq: Three times a day (TID) | ORAL | Status: DC
  Filled 2017-02-07: qty 2

## 2017-02-07 MED ORDER — HEPARIN SODIUM (PORCINE) 1000 UNIT/ML DIALYSIS
20.0000 [IU]/kg | INTRAMUSCULAR | Status: DC | PRN
  Administered 2017-02-08: 1300 [IU] via INTRAVENOUS_CENTRAL

## 2017-02-07 MED ORDER — VITAMIN B-1 100 MG PO TABS
100.0000 mg | ORAL_TABLET | Freq: Every day | ORAL | Status: DC
  Administered 2017-02-07 – 2017-03-15 (×22): 100 mg via ORAL
  Filled 2017-02-07 (×34): qty 1

## 2017-02-07 MED ORDER — PROPOFOL 10 MG/ML IV BOLUS
INTRAVENOUS | Status: AC
  Filled 2017-02-07: qty 20

## 2017-02-07 MED ORDER — ORAL CARE MOUTH RINSE
15.0000 mL | Freq: Two times a day (BID) | OROMUCOSAL | Status: DC
  Administered 2017-02-08 – 2017-03-14 (×30): 15 mL via OROMUCOSAL

## 2017-02-07 MED ORDER — PROMETHAZINE HCL 25 MG/ML IJ SOLN: 6.2500 mg | INTRAMUSCULAR | Status: DC | PRN

## 2017-02-07 MED ORDER — AMLODIPINE BESYLATE 5 MG PO TABS
5.0000 mg | ORAL_TABLET | Freq: Every day | ORAL | Status: DC
  Administered 2017-02-07 – 2017-03-15 (×36): 5 mg via ORAL
  Filled 2017-02-07 (×37): qty 1

## 2017-02-07 MED ORDER — LIDOCAINE 2% (20 MG/ML) 5 ML SYRINGE
INTRAMUSCULAR | Status: DC | PRN
  Administered 2017-02-07: 60 mg via INTRAVENOUS

## 2017-02-07 MED ORDER — FLUMAZENIL 0.5 MG/5ML IV SOLN
INTRAVENOUS | Status: DC | PRN
  Administered 2017-02-07 (×2): 0.2 mg via INTRAVENOUS

## 2017-02-07 MED ORDER — FOLIC ACID 1 MG PO TABS
1.0000 mg | ORAL_TABLET | Freq: Every day | ORAL | Status: DC
  Administered 2017-02-07 – 2017-03-15 (×25): 1 mg via ORAL
  Filled 2017-02-07 (×34): qty 1

## 2017-02-07 MED ORDER — FENTANYL CITRATE (PF) 100 MCG/2ML IJ SOLN: 25.0000 ug | INTRAMUSCULAR | Status: DC | PRN

## 2017-02-07 MED ORDER — CEFAZOLIN SODIUM-DEXTROSE 1-4 GM/50ML-% IV SOLN
INTRAVENOUS | Status: DC | PRN
  Administered 2017-02-07: 1 g via INTRAVENOUS

## 2017-02-07 MED ORDER — HEPARIN SODIUM (PORCINE) 1000 UNIT/ML IJ SOLN
INTRAMUSCULAR | Status: DC | PRN
  Administered 2017-02-07: 3400 [IU] via INTRA_ARTERIAL

## 2017-02-07 MED ORDER — ASPIRIN EC 81 MG PO TBEC
81.0000 mg | DELAYED_RELEASE_TABLET | Freq: Two times a day (BID) | ORAL | Status: DC
  Administered 2017-02-07 – 2017-03-15 (×71): 81 mg via ORAL
  Filled 2017-02-07 (×72): qty 1

## 2017-02-07 MED ORDER — FENTANYL CITRATE (PF) 250 MCG/5ML IJ SOLN
INTRAMUSCULAR | Status: DC | PRN
  Administered 2017-02-07 (×2): 25 ug via INTRAVENOUS

## 2017-02-07 MED ORDER — LEVOTHYROXINE SODIUM 25 MCG PO TABS
25.0000 ug | ORAL_TABLET | Freq: Every day | ORAL | Status: DC
  Administered 2017-02-07 – 2017-03-15 (×36): 25 ug via ORAL
  Filled 2017-02-07 (×36): qty 1

## 2017-02-07 MED ORDER — LIDOCAINE-PRILOCAINE 2.5-2.5 % EX CREA: 1.0000 "application " | TOPICAL_CREAM | CUTANEOUS | Status: DC | PRN

## 2017-02-07 MED ORDER — GABAPENTIN 100 MG PO CAPS
100.0000 mg | ORAL_CAPSULE | Freq: Two times a day (BID) | ORAL | Status: DC
  Administered 2017-02-07 – 2017-03-03 (×25): 100 mg via ORAL
  Filled 2017-02-07 (×48): qty 1

## 2017-02-07 MED ORDER — LORAZEPAM 1 MG PO TABS
1.0000 mg | ORAL_TABLET | Freq: Four times a day (QID) | ORAL | Status: AC | PRN
  Administered 2017-02-07: 1 mg via ORAL
  Filled 2017-02-07: qty 1

## 2017-02-07 MED ORDER — LORAZEPAM 2 MG/ML IJ SOLN: 1.0000 mg | Freq: Four times a day (QID) | INTRAMUSCULAR | Status: AC | PRN

## 2017-02-07 MED ORDER — INSULIN ASPART 100 UNIT/ML ~~LOC~~ SOLN
0.0000 [IU] | SUBCUTANEOUS | Status: DC
  Administered 2017-02-07: 3 [IU] via SUBCUTANEOUS
  Administered 2017-02-07: 5 [IU] via SUBCUTANEOUS
  Administered 2017-02-07 – 2017-02-08 (×3): 2 [IU] via SUBCUTANEOUS
  Administered 2017-02-08: 3 [IU] via SUBCUTANEOUS
  Administered 2017-02-08: 2 [IU] via SUBCUTANEOUS
  Administered 2017-02-08: 3 [IU] via SUBCUTANEOUS
  Administered 2017-02-09: 2 [IU] via SUBCUTANEOUS
  Administered 2017-02-09: 3 [IU] via SUBCUTANEOUS
  Administered 2017-02-09: 2 [IU] via SUBCUTANEOUS
  Administered 2017-02-10: 3 [IU] via SUBCUTANEOUS
  Administered 2017-02-10 – 2017-02-11 (×3): 2 [IU] via SUBCUTANEOUS
  Administered 2017-02-12: 7 [IU] via SUBCUTANEOUS
  Administered 2017-02-12: 9 [IU] via SUBCUTANEOUS
  Administered 2017-02-12: 3 [IU] via SUBCUTANEOUS
  Administered 2017-02-12 – 2017-02-13 (×3): 2 [IU] via SUBCUTANEOUS
  Administered 2017-02-13: 1 [IU] via SUBCUTANEOUS
  Administered 2017-02-13 – 2017-02-14 (×4): 2 [IU] via SUBCUTANEOUS
  Administered 2017-02-15: 5 [IU] via SUBCUTANEOUS
  Administered 2017-02-15 – 2017-02-16 (×2): 2 [IU] via SUBCUTANEOUS
  Administered 2017-02-16: 5 [IU] via SUBCUTANEOUS
  Administered 2017-02-16: 1 [IU] via SUBCUTANEOUS
  Administered 2017-02-16: 2 [IU] via SUBCUTANEOUS
  Administered 2017-02-17: 0 [IU] via SUBCUTANEOUS
  Administered 2017-02-17: 5 [IU] via SUBCUTANEOUS
  Administered 2017-02-17 (×2): 1 [IU] via SUBCUTANEOUS
  Administered 2017-02-17: 2 [IU] via SUBCUTANEOUS
  Administered 2017-02-18: 7 [IU] via SUBCUTANEOUS
  Administered 2017-02-18: 2 [IU] via SUBCUTANEOUS
  Administered 2017-02-18 – 2017-02-19 (×2): 1 [IU] via SUBCUTANEOUS
  Administered 2017-02-19 (×2): 2 [IU] via SUBCUTANEOUS
  Administered 2017-02-19: 3 [IU] via SUBCUTANEOUS
  Administered 2017-02-19 – 2017-02-20 (×4): 2 [IU] via SUBCUTANEOUS

## 2017-02-07 MED ORDER — PROPOFOL 10 MG/ML IV BOLUS
INTRAVENOUS | Status: DC | PRN
  Administered 2017-02-07: 120 mg via INTRAVENOUS

## 2017-02-07 MED ORDER — LANTHANUM CARBONATE 500 MG PO CHEW
1000.0000 mg | CHEWABLE_TABLET | Freq: Three times a day (TID) | ORAL | Status: DC
  Administered 2017-02-07 – 2017-02-19 (×27): 1000 mg via ORAL
  Filled 2017-02-07 (×28): qty 2

## 2017-02-07 MED ORDER — HEPARIN SODIUM (PORCINE) 1000 UNIT/ML IJ SOLN
INTRAMUSCULAR | Status: AC
  Filled 2017-02-07: qty 1

## 2017-02-07 MED ORDER — PHENYLEPHRINE HCL 10 MG/ML IJ SOLN
INTRAMUSCULAR | Status: DC | PRN
  Administered 2017-02-07: 50 ug/min via INTRAVENOUS

## 2017-02-07 MED ORDER — CYCLOBENZAPRINE HCL 5 MG PO TABS: 5.0000 mg | ORAL_TABLET | Freq: Once | ORAL | Status: DC

## 2017-02-07 MED ORDER — HEPARIN SODIUM (PORCINE) 1000 UNIT/ML DIALYSIS
1000.0000 [IU] | INTRAMUSCULAR | Status: DC | PRN
  Administered 2017-02-08: 1000 [IU] via INTRAVENOUS_CENTRAL

## 2017-02-07 SURGICAL SUPPLY — 39 items
ADH SKN CLS APL DERMABOND .7 (GAUZE/BANDAGES/DRESSINGS) ×1
BAG DECANTER FOR FLEXI CONT (MISCELLANEOUS) ×3 IMPLANT
BIOPATCH RED 1 DISK 7.0 (GAUZE/BANDAGES/DRESSINGS) ×2 IMPLANT
BIOPATCH RED 1IN DISK 7.0MM (GAUZE/BANDAGES/DRESSINGS) ×1
CATH PALINDROME RT-P 15FX19CM (CATHETERS) IMPLANT
CATH PALINDROME RT-P 15FX23CM (CATHETERS) IMPLANT
CATH PALINDROME RT-P 15FX28CM (CATHETERS) IMPLANT
CATH PALINDROME RT-P 15FX55CM (CATHETERS) IMPLANT
COVER PROBE W GEL 5X96 (DRAPES) IMPLANT
COVER SURGICAL LIGHT HANDLE (MISCELLANEOUS) ×3 IMPLANT
DECANTER SPIKE VIAL GLASS SM (MISCELLANEOUS) ×3 IMPLANT
DERMABOND ADVANCED (GAUZE/BANDAGES/DRESSINGS) ×2
DERMABOND ADVANCED .7 DNX12 (GAUZE/BANDAGES/DRESSINGS) IMPLANT
DRAPE C-ARM 42X72 X-RAY (DRAPES) ×3 IMPLANT
DRAPE CHEST BREAST 15X10 FENES (DRAPES) ×3 IMPLANT
GLOVE SS BIOGEL STRL SZ 7.5 (GLOVE) ×1 IMPLANT
GLOVE SUPERSENSE BIOGEL SZ 7.5 (GLOVE) ×2
GOWN STRL REUS W/ TWL LRG LVL3 (GOWN DISPOSABLE) ×2 IMPLANT
GOWN STRL REUS W/TWL LRG LVL3 (GOWN DISPOSABLE) ×6
KIT BASIN OR (CUSTOM PROCEDURE TRAY) ×3 IMPLANT
KIT ROOM TURNOVER OR (KITS) ×3 IMPLANT
NDL 18GX1X1/2 (RX/OR ONLY) (NEEDLE) ×1 IMPLANT
NDL HYPO 25GX1X1/2 BEV (NEEDLE) ×1 IMPLANT
NEEDLE 18GX1X1/2 (RX/OR ONLY) (NEEDLE) ×3 IMPLANT
NEEDLE 22X1 1/2 (OR ONLY) (NEEDLE) IMPLANT
NEEDLE HYPO 25GX1X1/2 BEV (NEEDLE) ×3 IMPLANT
NS IRRIG 1000ML POUR BTL (IV SOLUTION) ×3 IMPLANT
PACK SURGICAL SETUP 50X90 (CUSTOM PROCEDURE TRAY) ×3 IMPLANT
PAD ARMBOARD 7.5X6 YLW CONV (MISCELLANEOUS) ×6 IMPLANT
SOAP 2 % CHG 4 OZ (WOUND CARE) ×3 IMPLANT
SUT ETHILON 3 0 PS 1 (SUTURE) ×3 IMPLANT
SUT VICRYL 4-0 PS2 18IN ABS (SUTURE) ×3 IMPLANT
SYR 10ML LL (SYRINGE) ×3 IMPLANT
SYR 20CC LL (SYRINGE) ×3 IMPLANT
SYR 5ML LL (SYRINGE) ×6 IMPLANT
SYR CONTROL 10ML LL (SYRINGE) ×3 IMPLANT
TOWEL GREEN STERILE (TOWEL DISPOSABLE) ×3 IMPLANT
TOWEL GREEN STERILE FF (TOWEL DISPOSABLE) ×3 IMPLANT
WATER STERILE IRR 1000ML POUR (IV SOLUTION) ×3 IMPLANT

## 2017-02-07 NOTE — ED Notes (Signed)
Pt sitting up on stretcher awake and alert, c/o abd pain and nausea.

## 2017-02-07 NOTE — Anesthesia Preprocedure Evaluation (Addendum)
Anesthesia Evaluation  Patient identified by MRN, date of birth, ID band Patient unresponsive    Reviewed: Allergy & Precautions, H&P , NPO status , Patient's Chart, lab work & pertinent test results, Unable to perform ROS - Chart review only  History of Anesthesia Complications (+) PONV  Airway Mallampati: II  TM Distance: >3 FB Neck ROM: Full    Dental  (+) Edentulous Upper, Partial Lower, Dental Advisory Given   Pulmonary neg pulmonary ROS, former smoker,    Pulmonary exam normal breath sounds clear to auscultation       Cardiovascular hypertension, Pt. on medications + CAD, + CABG, +CHF and + DVT   Rhythm:Regular Rate:Normal + Systolic murmurs EKG - inferolateral TWI   Neuro/Psych CVA negative psych ROS   GI/Hepatic Neg liver ROS, GERD  ,  Endo/Other  diabetes, Type 2, Insulin DependentHypothyroidism   Renal/GU Dialysis and ESRFRenal disease  negative genitourinary   Musculoskeletal   Abdominal   Peds  Hematology negative hematology ROS (+)   Anesthesia Other Findings   Reproductive/Obstetrics negative OB ROS                            Anesthesia Physical  Anesthesia Plan  ASA: III and emergent  Anesthesia Plan: General   Post-op Pain Management:    Induction: Intravenous  PONV Risk Score and Plan: 4 or greater and Ondansetron, Dexamethasone, Midazolam, Scopolamine patch - Pre-op and Treatment may vary due to age or medical condition  Airway Management Planned: LMA  Additional Equipment:   Intra-op Plan:   Post-operative Plan: Extubation in OR  Informed Consent: I have reviewed the patients History and Physical, chart, labs and discussed the procedure including the risks, benefits and alternatives for the proposed anesthesia with the patient or authorized representative who has indicated his/her understanding and acceptance.   Dental advisory given  Plan Discussed  with: CRNA  Anesthesia Plan Comments:       Anesthesia Quick Evaluation

## 2017-02-07 NOTE — ED Notes (Signed)
Changed leg bag to a drainage bag.

## 2017-02-07 NOTE — Consult Note (Signed)
[facility] Renal Consultation Note    Indication for Consultation:  Management of ESRD/hemodialysis; anemia, hypertension/volume and secondary hyperparathyroidism  HPI: Brandi Arellano is a 61 y.o. female with ESRD on HD, Type 2, DM, HTN, CAD s/p CABG chronic dCHF, hx CVA, PVD s/p R AKA, L TMA, hypothyroidism,  ESRD secondary to DM/HTN. HD initiated 10/2013.   Brandi Arellano presented to [facility] ED yesterday from home with AMS. Per ED notes family found patient bleeding from cut tip of dialysis catheter. She was found with scissors in the bed.  In the ED she was was given Narcan and mentation noted to improve immediately. Dialysis catheter was clamped with hemostat.   Abdominal CT showed polypoid mass arising from the posterior base of the bladder highly concerning for a neoplastic process. Diffuse thickened appearance of the bladder wall may be related to cystitis or infiltration of tumor. She has an indwelling Foley cath in place secondary to urinary retention with h/o recurrent UTI. Cefepime started for empiric UTI coverage. CXR showed worsening pulmonary edema. Head CT with no acute findings. Labs stable. Urine tox screen negative.   She is seen at bedside and remains drowsy, falls asleep during questions, but easy to rouse. She reports worsening nausea and abdominal pain for the last week. Denies fever, CP, SOB, hematemesis. Of note has been placed on suiced precautions for concern of suicide attempt.   Dialyzes at [facility] MWF. Dialyzes via TDC, with no permanent options2/2 severe PAD, small veins Her last dialysis was Friday 10/26 for 1:17 and left 1.5kg above EDW. S/o early with abdominal pain.   Past Medical History:  Diagnosis Date  . Anemia   . CAD (coronary artery disease)    CABG  . CHF (congestive heart failure) (HCC)    Acute on chronic diastolic  . Complication of anesthesia   . Diabetes mellitus without complication (HCC)    Type II  . DVT (deep  venous thrombosis) ([place])   . ESRD (end stage renal disease) ([place])    HD M-W-F  . Gangrene of lower extremity (HCC)    RLE  . GERD (gastroesophageal reflux disease)   . Hx of AKA (above knee amputation), right ([place])   . Hyperlipidemia   . Hypertension   . Hypothyroidism   . PONV (postoperative nausea and vomiting)   . Stroke [facility])    "light stroke" no deficits  . Thrombocytopenia ([place])   . Thyroid disease    Abnormal Thyroid function Test  . UTI (lower urinary tract infection)    Past Surgical History:  Procedure Laterality Date  . ABDOMINAL ANGIOGRAM  12/26/2013   Procedure: ABDOMINAL ANGIOGRAM;  Surgeon: Serafina Amodei, MD;  Location: [facility] CATH LAB;  Service: Cardiovascular;;  . AMPUTATION Right 02/01/2014   Procedure: Right Below Knee Amputation;  Surgeon: Newt Minion, MD;  Location: [place];  Service: Orthopedics;  Laterality: Right;  . AMPUTATION Right 03/24/2014   Procedure: AMPUTATION ABOVE KNEE;  Surgeon: Newt Minion, MD;  Location: [place];  Service: Orthopedics;  Laterality: Right;  . ARCH AORTOGRAM  12/26/2013   Procedure: ARCH AORTOGRAM;  Surgeon: Serafina Cerritos, MD;  Location: [facility] CATH LAB;  Service: Cardiovascular;;  . CAROTID ENDARTERECTOMY     Bilateral  . CHOLECYSTECTOMY    . CORONARY ARTERY BYPASS GRAFT    . FOOT AMPUTATION Bilateral   . HARDWARE REMOVAL Right 07/19/2014   Procedure: Removal Deep Hardware Right Femur;  Surgeon: Newt Minion, MD;  Location:  [place] OR;  Service: Orthopedics;  Laterality: Right;  Six screws and one condylar plate removed   . hemodialysis catheter    . LOWER EXTREMITY ANGIOGRAM N/A 12/26/2013   Procedure: LOWER EXTREMITY ANGIOGRAM;  Surgeon: Serafina Christy, MD;  Location: [facility] CATH LAB;  Service: Cardiovascular;  Laterality: N/A;  . UPPER EXTREMITY ANGIOGRAM Right 12/26/2013   Procedure: UPPER EXTREMITY ANGIOGRAM;  Surgeon: Serafina Defeo, MD;  Location: [facility] CATH LAB;  Service: Cardiovascular;  Laterality: Right;   Family History  Problem  Relation Age of Onset  . Adopted: Yes  . Hypertension Father   . Heart disease Father        Coronary Artery Disease   Social History:  reports that she has quit smoking. Her smoking use included Cigarettes. She quit after 15.00 years of use. She has never used smokeless tobacco. She reports that she does not drink alcohol or use drugs. Allergies  Allergen Reactions  . Diphenhydramine Nausea And Vomiting  . Tramadol Hcl Nausea And Vomiting  . Morphine And Related Nausea And Vomiting   Prior to Admission medications   Medication Sig Start Date End Date Taking? Authorizing Provider  amLODipine (NORVASC) 5 MG tablet Take 5 mg by mouth daily.   Yes [provider]  aspirin EC 81 MG tablet Take 81 mg by mouth daily.    Yes [provider]  atorvastatin (LIPITOR) 40 MG tablet Take 40 mg by mouth every evening.    Yes [provider]  cefpodoxime (VANTIN) 200 MG tablet Take 1 tab PO after dialysis for 4 doses Patient taking differently: Take 200 mg by mouth every 12 (twelve) hours. after dialysis 01/01/17  Yes Kirichenko, Tatyana, PA-C  insulin aspart (NOVOLOG) 100 UNIT/ML injection Inject 5 Units into the skin daily as needed for high blood sugar. Check BS twice daily : If >150 inject 5 units    Yes [provider]  lanthanum (FOSRENOL) 1000 MG chewable tablet Chew 1,000 mg by mouth See admin instructions. Pt to take 1 tablet with meals three times daily and 1 tablet with snacks   Yes [provider]  levothyroxine (SYNTHROID, LEVOTHROID) 25 MCG tablet Take 25 mcg by mouth daily before breakfast.   Yes [provider]   Current Facility-Administered Medications  Medication Dose Route Frequency Provider Last Rate Last Dose  . acetaminophen (TYLENOL) tablet 650 mg  650 mg Oral Q6H PRN Sela Hilding, MD   650 mg at 02/07/17 2458   Or  . acetaminophen (TYLENOL) suppository 650 mg  650 mg Rectal Q6H PRN Sela Hilding, MD      .  amLODipine (NORVASC) tablet 5 mg  5 mg Oral Daily Sela Hilding, MD   5 mg at 02/07/17 0810  . aspirin EC tablet 81 mg  81 mg Oral BID Sela Hilding, MD   81 mg at 02/07/17 0810  . atorvastatin (LIPITOR) tablet 40 mg  40 mg Oral QPM Sela Hilding, MD      . ceFEPIme (MAXIPIME) 2 g in dextrose 5 % 50 mL IVPB  2 g Intravenous Q M,W,F-2000 Erenest Blank, RPH      . folic acid (FOLVITE) tablet 1 mg  1 mg Oral Daily Sela Hilding, MD   1 mg at 02/07/17 0810  . gabapentin (NEURONTIN) 250 MG/5ML solution 100 mg  100 mg Oral Q8H Guadalupe Dawn, MD      . heparin injection 5,000 Units  5,000 Units Subcutaneous Q8H Sela Hilding, MD      .  insulin aspart (novoLOG) injection 0-9 Units  0-9 Units Subcutaneous Q4H Guadalupe Dawn, MD   3 Units at 02/07/17 0446  . iopamidol (ISOVUE-300) 61 % injection           . levothyroxine (SYNTHROID, LEVOTHROID) tablet 25 mcg  25 mcg Oral QAC breakfast Sela Hilding, MD   25 mcg at 02/07/17 0810  . LORazepam (ATIVAN) tablet 1 mg  1 mg Oral Q6H PRN Sela Hilding, MD   1 mg at 02/07/17 0809   Or  . LORazepam (ATIVAN) injection 1 mg  1 mg Intravenous Q6H PRN Sela Hilding, MD      . multivitamin with minerals tablet 1 tablet  1 tablet Oral Daily Sela Hilding, MD   1 tablet at 02/07/17 0809  . sodium chloride flush (NS) 0.9 % injection 3 mL  3 mL Intravenous Q12H Sela Hilding, MD   3 mL at 02/07/17 0811  . thiamine (VITAMIN B-1) tablet 100 mg  100 mg Oral Daily Sela Hilding, MD   100 mg at 02/07/17 0809   Facility-Administered Medications Ordered in Other Encounters  Medication Dose Route Frequency Provider Last Rate Last Dose  . 0.9 %  sodium chloride infusion    Continuous PRN Laretta Alstrom, CRNA        ROS: As per HPI otherwise negative.  Physical Exam: Vitals:   02/07/17 0200 02/07/17 0246 02/07/17 0745 02/07/17 0758  BP: (!) 168/90 (!) 169/85 (!) 183/94 (!) 183/94  Pulse: (!) 106 100 87  87  Resp: (!) 30 20  20   Temp:  98.1 F (36.7 C)  98.3 F (36.8 C)  TempSrc:  Oral  Oral  SpO2: (!) 86% 98%  99%  Weight:      Height:  5\' 6"  (1.676 m)       General: WDWN Female NAD using nasal O2 Head: NCAT sclera not icteric MMM Neck: Supple. No JVD CEA scars bilaterally Lungs: Breathing unlabored CTAB  Heart: RRR with S1 S2 3/6 systolic EM Abdomen: soft NT + BS Lower extremities: R AKA, L TMA no LE edema  Neuro: Drowsy, falls asleep during questions  Psych:  Flat affect, drowsy  Dialysis Access: R IJ TDC, bandaged with hemostat in place   Labs: Basic Metabolic Panel:  Recent Labs Lab 02/06/17 0200 02/06/17 2000 02/06/17 2002 02/07/17 0431  NA 135 135 137 136  K 4.4 4.4 4.3 4.8  CL 100* 96* 101 100*  CO2 22 19*  --  17*  GLUCOSE 198* 236* 242* 237*  BUN 53* 65* 56* 68*  CREATININE 7.90* 8.81* 9.10* 8.90*  CALCIUM 8.4* 9.2  --  8.4*   Liver Function Tests:  Recent Labs Lab 02/06/17 0200 02/06/17 2000  AST 18 33  ALT 12* 17  ALKPHOS 46 66  BILITOT 1.0 1.2  PROT 6.6 8.2*  ALBUMIN 3.0* 3.7    Recent Labs Lab 02/06/17 0200 02/06/17 2335  LIPASE 116* 66*   No results for input(s): AMMONIA in the last 168 hours. CBC:  Recent Labs Lab 02/06/17 0200 02/06/17 2000 02/06/17 2002 02/07/17 0431  WBC 6.0 10.3  --  8.3  NEUTROABS  --  8.9*  --   --   HGB 11.0* 12.9 15.3* 12.4  HCT 34.9* 41.1 45.0 40.2  MCV 95.4 96.3  --  95.9  PLT 118* 163  --  154   Cardiac Enzymes: No results for input(s): CKTOTAL, CKMB, CKMBINDEX, TROPONINI in the last 168 hours. CBG:  Recent Labs Lab 02/07/17 0433 02/07/17  0753  GLUCAP 240* 119*   Iron Studies: No results for input(s): IRON, TIBC, TRANSFERRIN, FERRITIN in the last 72 hours. Studies/Results: Ct Head Wo Contrast  Result Date: 02/06/2017 CLINICAL DATA:  Altered level of consciousness. History of stroke, carotid endarterectomy, hypertension, hyperlipidemia, diabetes. EXAM: CT HEAD WITHOUT CONTRAST  TECHNIQUE: Contiguous axial images were obtained from the base of the skull through the vertex without intravenous contrast. COMPARISON:  CT HEAD November 11, 2016 FINDINGS: BRAIN: No intraparenchymal hemorrhage, mass effect nor midline shift. Patchy supratentorial white matter hypodensities. Old RIGHT putaminal and LEFT caudate and RIGHT thalamus lacunar infarcts. Mild ex vacuo dilatation LEFT frontal horn of lateral ventricle. No hydrocephalus. No acute large vascular territory infarcts. No abnormal extra-axial fluid collections. Basal cisterns are patent. VASCULAR: Severe calcific atherosclerosis of the carotid siphons and intradural vertebral artery's. SKULL: No skull fracture. No significant scalp soft tissue swelling. SINUSES/ORBITS: The mastoid air-cells and included paranasal sinuses are well-aerated. Soft tissue within LEFT external auditory canal compatible with cerumen. The included ocular globes and orbital contents are non-suspicious. Status post LEFT ocular lens implant. OTHER: None. IMPRESSION: 1. No acute intracranial process. 2. Mild chronic small vessel ischemic disease. Old basal ganglia and RIGHT thalamus lacunar infarcts. 3. Severe atherosclerosis. Electronically Signed   By: Elon Alas M.D.   On: 02/06/2017 22:31   Ct Abdomen Pelvis W Contrast  Result Date: 02/06/2017 CLINICAL DATA:  61 year old female with abdominal pain. History of UTI and diabetes. Patient is on dialysis. EXAM: CT ABDOMEN AND PELVIS WITH CONTRAST TECHNIQUE: Multidetector CT imaging of the abdomen and pelvis was performed using the standard protocol following bolus administration of intravenous contrast. CONTRAST:  174mL ISOVUE-300 IOPAMIDOL (ISOVUE-300) INJECTION 61% COMPARISON:  Abdominal CT dated 01/01/2017 FINDINGS: Lower chest: Partially visualized moderate bilateral pleural effusions, new compared to the CT of 01/01/2017. There is diffuse interstitial prominence consistent with pulmonary edema. Bibasilar  airspace densities may represent atelectatic changes versus infiltrate. Multi vessel coronary vascular calcification noted. There is no intra-abdominal free air. There is a small free fluid within the pelvis. Hepatobiliary: The liver is unremarkable. There is mild dilatation of the intrahepatic biliary tree as well as common bile duct which may be related to post cholecystectomy changes. However if there has been interval increase in the dilatation of the intrahepatic biliary tree which may represent some periportal edema. The common bile duct measures up to 13 mm in diameter. No retained calcified stone noted in the central CBD. Pancreas: Unremarkable. No pancreatic ductal dilatation or surrounding inflammatory changes. Spleen: Normal in size without focal abnormality. Adrenals/Urinary Tract: The adrenal glands are unremarkable. There is moderate bilateral renal parenchyma atrophy. Bilateral renovascular calcification noted. There is no hydronephrosis on either side. There is diffusely thickened and trabecular appearance of the bladder wall. There is a 3 x 2 x 3 cm heterogeneously enhancing mass arising from the posterior base of the bladder and to the right of the midline. There is associated mass effect and displacement of the balloon of the Foley catheter to the left. Findings most consistent with a neoplastic process possibly a transitional cell carcinoma. Further evaluation with cystoscopy is recommended. Diffuse thickening of the bladder wall with perivesical stranding may be related to infiltrative tumor or represent acute cystitis. Correlation with urinalysis recommended. Air within the urinary bladder, likely introduced via Foley catheter. Stomach/Bowel: Evaluation of the bowel is limited in the absence of oral contrast. Diffuse thickened appearance of the colon likely related to underdistention. Correlation with clinical exam is recommended to  exclude colitis. There is no bowel obstruction. The appendix  is normal. Vascular/Lymphatic: There is advanced aortoiliac atherosclerotic disease. There is a 2.4 cm infrarenal aortic ectasia. The IVC is unremarkable. No portal venous gas identified. No adenopathy. Reproductive: The uterus is retroflexed and grossly unremarkable. The ovaries are poorly visualized. Other: Mild diffuse subcutaneous edema and anasarca. Musculoskeletal: No acute osseous pathology. IMPRESSION: 1. Polypoid mass arising from the posterior base of the bladder highly concerning for a neoplastic process. Diffuse thickened appearance of the bladder wall may be related to cystitis or infiltration of tumor. Correlation with urinalysis recommended. Further evaluation with cystoscopy, following resolution of acute inflammation, recommended. 2. Diffusely thickened: Likely related to underdistention. Clinical correlation is recommended to exclude colitis. No bowel obstruction. Normal appendix. 3. Partially visualized moderate bilateral pleural effusions, pulmonary edema, small ascites, and anasarca. 4.  Aortic Atherosclerosis (ICD10-I70.0). 5. A 2.4 cm infrarenal aortic ectasia. Electronically Signed   By: Anner Crete M.D.   On: 02/06/2017 23:57   Dg Chest Portable 1 View  Result Date: 02/06/2017 CLINICAL DATA:  Sever Port-A-Cath EXAM: PORTABLE CHEST 1 VIEW COMPARISON:  February 06, 2017 FINDINGS: The patient's double-lumen PermCath appears to be clamped with a hemostat. No pneumothorax. Worsening pulmonary edema. Cardiomegaly. No other interval changes. IMPRESSION: 1. Worsening pulmonary edema.  PermCath as above. Electronically Signed   By: Dorise Bullion III M.D   On: 02/06/2017 20:58   Dg Abd Acute W/chest  Result Date: 02/06/2017 CLINICAL DATA:  Abdominal pain for 2 hours. Nausea and diarrhea. History of end-stage renal disease on dialysis. EXAM: DG ABDOMEN ACUTE W/ 1V CHEST COMPARISON:  Chest radiograph November 11, 2016 an CT abdomen and pelvis November 11, 2016 FINDINGS: Cardiac silhouette is  mildly enlarged and unchanged. Calcified aortic knob. Status post median sternotomy for CABG. Vascular stent projects at aortic arch. Diffuse interstitial prominence slightly worse than prior radiograph without pleural effusion or focal consolidation. Apical pleural thickening. No pneumothorax. Surgical clips in the neck consistent with history of carotid endarterectomy. Vascular clips LEFT arm. Tunneled dialysis catheter via RIGHT internal jugular venous approach with distal tip projecting cavoatrial junction. Bowel gas pattern is nondilated and nonobstructive. Overall paucity of bowel gas. Extensive vascular calcifications is seen on prior CT. Surgical clips in the included right abdomen compatible with cholecystectomy. No intra-abdominal mass effect. No intraperitoneal free air. Soft tissue planes included osseous structures are nonacute. IMPRESSION: Stable cardiomegaly.  Worsening interstitial edema. Nonspecific bowel gas pattern. Electronically Signed   By: Elon Alas M.D.   On: 02/06/2017 04:33    Dialysis Orders:  [facility] MWF  4h 160F 400/1.5x EDW 56.5kg 2K/2.25 Ca Prof 4 Na Linear TDC Hep 4000 U Hectorol 98mcg IV tiw BMM: Fosrenol 1000mg  2 tabs tid  Assessment/Plan: 1. AMS - ddx sepsis, polypharmacy - Head CT with no acute findings -- per primary  2. Urinary retention/UTI - indwelling Foley in place with recurrent UTI - cultures pending - cefepime started  3. ESRD -  MWF - Due for HD, will need cath exchange first. Pt has severe PAD and not a candidate for UE or LE access due to high risk of steal, see VVS notes from Oct 2015.  4. Dialysis catheter trauma - cath tip severed, have asked VVS to see for exchange  5. Hypertension/volume  - Volume on CXR and hypertensive today. No BP meds on OP list. Challenge EDW with next HD. UF goal 3L  6. Anemia  - Hgb 12.4 No ESA needs  7.  Metabolic  bone disease -  Cont Hectorol/Fosrenol binder  8. Nutrition - Renal diet/vitamins 9. DM - per primary   10. Bladder mass - seen on abd CT highly concerning for neoplastic process  - urology consulted  11. SI - sitter/suicide precautions, psych consult pending   Lynnda Child [facility] Pager [phone] 02/07/2017, 10:02 AM   Pt seen, examined, agree w assess/plan as above with additions as indicated. Plan HD, possibly later today if her [place] will be fixed.  VVS evaluating.  [name] MD [employer] pager [phone]    cell [phone] 02/07/2017, 1:01 PM

## 2017-02-07 NOTE — Progress Notes (Signed)
Pharmacy Antibiotic Note  Taran Hable is a 61 y.o. female admitted on 02/06/2017 with altered mental status.  Pharmacy has been consulted for Cefepime dosing for UTI coverage. Pt has a chronic indwelling foley. WBC is WNL. ESRD on HD MWF.  Plan: Cefepime 2g IV x 1 now, then 2g IV at 2000 on HD days Trend WBC, temp, HD schedule   Height: 5\' 6"  (167.6 cm) Weight: 140 lb (63.5 kg) IBW/kg (Calculated) : 59.3  Temp (24hrs), Avg:97.7 F (36.5 C), Min:97.1 F (36.2 C), Max:98.2 F (36.8 C)   Recent Labs Lab 02/06/17 0200 02/06/17 2000 02/06/17 2002 02/06/17 2005  WBC 6.0 10.3  --   --   CREATININE 7.90* 8.81* 9.10*  --   LATICACIDVEN  --   --   --  1.44    Estimated Creatinine Clearance: 6.2 mL/min (A) (by C-G formula based on SCr of 9.1 mg/dL (H)).    Allergies  Allergen Reactions  . Diphenhydramine Nausea And Vomiting  . Tramadol Hcl Nausea And Vomiting  . Morphine And Related Nausea And Vomiting    Narda Bonds 02/07/2017 1:36 AM

## 2017-02-07 NOTE — ED Notes (Signed)
No lab draw,  Pt not in room. 

## 2017-02-07 NOTE — ED Notes (Signed)
HR not 217, 102

## 2017-02-07 NOTE — Progress Notes (Signed)
   02/07/17 1000  Clinical Encounter Type  Visited With Health care provider;Patient not available  Visit Type Initial  Referral From Nurse  Consult/Referral To Chaplain   Responded to [facility] for a visit.  Initially the patient was off floor for test then second visit she had been given medication and was asleep.  Will continue to follow up. Chaplain Katherene Ponto

## 2017-02-07 NOTE — ED Notes (Signed)
Pt's brother and sister returned to ED after searching room at home, he presented this RN with a bag containing the tip of a permacath and a pair of scissors.  Brother and sister very concerned this was a suicide attempt by patient. Pt sister states she is aware pt uses pain medication inappropriately and is not sure if patient currently has any narcotics at home.  Pt denies any knowledge of how how catheter was cut.  EDP and admitting MD shown permacath.  Verbal order obtained for suicide precautions.

## 2017-02-07 NOTE — Progress Notes (Addendum)
0715 Bedside shift report, pt rocking back and forth in bed, sitter at bedside. Pt c/o pain, nausea. Pt agitated, anxious. Per night shift RN, medicated for pain. Pt A&Ox3, reoriented to time. Will continue to monitor.   0800 Pt's hypertensive, am meds given early. Medicated for pain and CIWA score of 12. Will continue to monitor.   0900 Nephro MD at bedside assessing pt.   1210 Pt down to OR via bed  1605 Pt back from OR. Right subclavian dsg CDI. Pt A&Ox4, sitter at bedside. No complaints at this time

## 2017-02-07 NOTE — Anesthesia Procedure Notes (Signed)
Procedure Name: LMA Insertion Date/Time: 02/07/2017 1:21 PM Performed by: Bryson Corona Pre-anesthesia Checklist: Patient identified, Emergency Drugs available, Suction available and Patient being monitored Patient Re-evaluated:Patient Re-evaluated prior to induction Oxygen Delivery Method: Circle System Utilized Preoxygenation: Pre-oxygenation with 100% oxygen Induction Type: IV induction Ventilation: Mask ventilation without difficulty LMA: LMA inserted LMA Size: 4.0 Number of attempts: 1 Airway Equipment and Method: Bite block Placement Confirmation: positive ETCO2 Tube secured with: Tape Dental Injury: Teeth and Oropharynx as per pre-operative assessment

## 2017-02-07 NOTE — Op Note (Signed)
    OPERATIVE REPORT  DATE OF SURGERY: 02/07/2017  PATIENT: Brandi Arellano, 61 y.o. female MRN: [id number]  DOB: Mar 14, 1956  PRE-OPERATIVE DIAGNOSIS: End-stage renal disease with transected tunneled catheter  POST-OPERATIVE DIAGNOSIS:  Same  PROCEDURE: Removal of existing tunneled catheter and replacement with new right IJ tunneled catheter  SURGEON:  Curt Jews, M.D.  PHYSICIAN ASSISTANT: Nurse  ANESTHESIA: General  EBL: Minimal ml  Total I/O In: 400 [I.V.:400] Out: 10 [Blood:10]  BLOOD ADMINISTERED: None  DRAINS: None  SPECIMEN: None  COUNTS CORRECT:  YES  PLAN OF CARE: PACU with chest x-ray pending  PATIENT DISPOSITION:  PACU - hemodynamically stable  PROCEDURE DETAILS: The patient today had transection of her catheter and had a hemostat placed on this to prevent bleeding and air embolus.  She was taken to the operating room for replacement.  The right and left neck and chest prepped draped usual sterile fashion.  An incision was made at the base of the neck over the area of the IJ insertion site.  The catheter was grasped with hemostat and was transected distally.  A guidewire was passed through the catheter and the central portion of the catheter was removed.  A dilator and peel-away sheath was passed over the guidewire and the dilator and guidewire removed.  The 23 cm hemodialysis catheter was passed through the peel-away sheath and the peel-away sheath was removed.  The catheter was placed to the level of the distal right atrium.  Separate incision was made in the tunnel was created from the separate area to the entry site and the catheter was brought through the subcutaneous tunnel.  2 lm were attached and both lumens flushed and aspirated easily were locked 1000 unit/cc heparin.  The catheter was secured to the skin with a 3-0 nylon stitch and the entry site was closed with a 4 subarticular Vicryl stitch.  The prior catheter's cuff was secured under the skin and this  was mobilized and the remaining portion of the catheter was removed in its entirety.  Patient was transferred to the recovery room where chest x-ray is pending   Brandi Arellano, M.D., [facility] 02/07/2017 2:37 PM

## 2017-02-07 NOTE — Progress Notes (Signed)
Urology consulted about this bladder mass on CT scan. Dr. Jeffie Pollock to see patient later today.

## 2017-02-07 NOTE — Consult Note (Signed)
[facility] NOTE   MRN : [id number]  Reason for Consult: Tunneled catheter placement Referring Physician: Dr. Jonnie Finner  History of Present Illness: 61 y/o female admitted with altered mental status, V/N/D.     Cr 8.90.  Patient is not responding verbally.  Past medical history: DM, HTN, Hyperlipidemia, ESRD last HD was Friday 02/04/2017.  Catheter was cut?     Current Facility-Administered Medications  Medication Dose Route Frequency Provider Last Rate Last Dose  . acetaminophen (TYLENOL) tablet 650 mg  650 mg Oral Q6H PRN Sela Hilding, MD   650 mg at 02/07/17 8182   Or  . acetaminophen (TYLENOL) suppository 650 mg  650 mg Rectal Q6H PRN Sela Hilding, MD      . amLODipine (NORVASC) tablet 5 mg  5 mg Oral Daily Sela Hilding, MD   5 mg at 02/07/17 0810  . aspirin EC tablet 81 mg  81 mg Oral BID Sela Hilding, MD   81 mg at 02/07/17 0810  . atorvastatin (LIPITOR) tablet 40 mg  40 mg Oral QPM Sela Hilding, MD      . ceFEPIme (MAXIPIME) 2 g in dextrose 5 % 50 mL IVPB  2 g Intravenous Q M,W,F-2000 Erenest Blank, RPH      . folic acid (FOLVITE) tablet 1 mg  1 mg Oral Daily Sela Hilding, MD   1 mg at 02/07/17 0810  . gabapentin (NEURONTIN) 250 MG/5ML solution 100 mg  100 mg Oral Q8H Guadalupe Dawn, MD      . heparin injection 5,000 Units  5,000 Units Subcutaneous Q8H Sela Hilding, MD      . insulin aspart (novoLOG) injection 0-9 Units  0-9 Units Subcutaneous Q4H Guadalupe Dawn, MD   3 Units at 02/07/17 0446  . iopamidol (ISOVUE-300) 61 % injection           . levothyroxine (SYNTHROID, LEVOTHROID) tablet 25 mcg  25 mcg Oral QAC breakfast Sela Hilding, MD   25 mcg at 02/07/17 0810  . LORazepam (ATIVAN) tablet 1 mg  1 mg Oral Q6H PRN Sela Hilding, MD   1 mg at 02/07/17 0809   Or  . LORazepam (ATIVAN) injection 1 mg  1 mg Intravenous Q6H PRN Sela Hilding, MD      . multivitamin with  minerals tablet 1 tablet  1 tablet Oral Daily Sela Hilding, MD   1 tablet at 02/07/17 0809  . sodium chloride flush (NS) 0.9 % injection 3 mL  3 mL Intravenous Q12H Sela Hilding, MD   3 mL at 02/07/17 0811  . thiamine (VITAMIN B-1) tablet 100 mg  100 mg Oral Daily Sela Hilding, MD   100 mg at 02/07/17 0809   Facility-Administered Medications Ordered in Other Encounters  Medication Dose Route Frequency Provider Last Rate Last Dose  . 0.9 %  sodium chloride infusion    Continuous PRN Laretta Alstrom, CRNA        Pt meds include: Statin :Yes Betablocker: No ASA: Yes Other anticoagulants/antiplatelets: none  Past Medical History:  Diagnosis Date  . Anemia   . CAD (coronary artery disease)    CABG  . CHF (congestive heart failure) (HCC)    Acute on chronic diastolic  . Complication of anesthesia   . Diabetes mellitus without complication (HCC)    Type II  . DVT (deep venous thrombosis) ([place])   . ESRD (end stage renal disease) ([place])    HD M-W-F  . Gangrene of lower extremity ([place])  RLE  . GERD (gastroesophageal reflux disease)   . Hx of AKA (above knee amputation), right ([place])   . Hyperlipidemia   . Hypertension   . Hypothyroidism   . PONV (postoperative nausea and vomiting)   . Stroke [facility])    "light stroke" no deficits  . Thrombocytopenia ([place])   . Thyroid disease    Abnormal Thyroid function Test  . UTI (lower urinary tract infection)     Past Surgical History:  Procedure Laterality Date  . ABDOMINAL ANGIOGRAM  12/26/2013   Procedure: ABDOMINAL ANGIOGRAM;  Surgeon: Serafina Marcellus, MD;  Location: [facility] CATH LAB;  Service: Cardiovascular;;  . AMPUTATION Right 02/01/2014   Procedure: Right Below Knee Amputation;  Surgeon: Newt Minion, MD;  Location: [place];  Service: Orthopedics;  Laterality: Right;  . AMPUTATION Right 03/24/2014   Procedure: AMPUTATION ABOVE KNEE;  Surgeon: Newt Minion, MD;  Location: [place];  Service: Orthopedics;  Laterality:  Right;  . ARCH AORTOGRAM  12/26/2013   Procedure: ARCH AORTOGRAM;  Surgeon: Serafina Sulewski, MD;  Location: [facility] CATH LAB;  Service: Cardiovascular;;  . CAROTID ENDARTERECTOMY     Bilateral  . CHOLECYSTECTOMY    . CORONARY ARTERY BYPASS GRAFT    . FOOT AMPUTATION Bilateral   . HARDWARE REMOVAL Right 07/19/2014   Procedure: Removal Deep Hardware Right Femur;  Surgeon: Newt Minion, MD;  Location: [place];  Service: Orthopedics;  Laterality: Right;  Six screws and one condylar plate removed   . hemodialysis catheter    . LOWER EXTREMITY ANGIOGRAM N/A 12/26/2013   Procedure: LOWER EXTREMITY ANGIOGRAM;  Surgeon: Serafina Monday, MD;  Location: [facility] CATH LAB;  Service: Cardiovascular;  Laterality: N/A;  . UPPER EXTREMITY ANGIOGRAM Right 12/26/2013   Procedure: UPPER EXTREMITY ANGIOGRAM;  Surgeon: Serafina Navratil, MD;  Location: [facility] CATH LAB;  Service: Cardiovascular;  Laterality: Right;    Social History Social History  Substance Use Topics  . Smoking status: Former Smoker    Years: 15.00    Types: Cigarettes  . Smokeless tobacco: Never Used     Comment: quit in 2002  . Alcohol use No    Family History Family History  Problem Relation Age of Onset  . Adopted: Yes  . Hypertension Father   . Heart disease Father        Coronary Artery Disease    Allergies  Allergen Reactions  . Diphenhydramine Nausea And Vomiting  . Tramadol Hcl Nausea And Vomiting  . Morphine And Related Nausea And Vomiting     REVIEW OF SYSTEMS   Patient not responsive to verbal ques.  General: [ ]  Weight loss, [ ]  Fever, [ ]  chills Neurologic: [ ]  Dizziness, [ ]  Blackouts, [ ]  Seizure [ ]  Stroke, [ ]  "Mini stroke", [ ]  Slurred speech, [ ]  Temporary blindness; [ ]  weakness in arms or legs, [ ]  Hoarseness [ ]  Dysphagia Cardiac: [ ]  Chest pain/pressure, [ ]  Shortness of breath at rest [ ]  Shortness of breath with exertion, [ ]  Atrial fibrillation or irregular heartbeat  Vascular: [ ]  Pain in legs with walking, [ ]   Pain in legs at rest, [ ]  Pain in legs at night,  [ ]  Non-healing ulcer, [ ]  Blood clot in vein/DVT,   Pulmonary: [ ]  Home oxygen, [ ]  Productive cough, [ ]  Coughing up blood, [ ]  Asthma,  [ ]  Wheezing [ ]  COPD Musculoskeletal:  [ ]  Arthritis, [ ]  Low back pain, [ ]  Joint pain  Hematologic: [ ]  Easy Bruising, [ ]  Anemia; [ ]  Hepatitis Gastrointestinal: [ ]  Blood in stool, [ ]  Gastroesophageal Reflux/heartburn, Urinary: [ ]  chronic Kidney disease, [x ] on HD - [x ] MWF or [ ]  TTHS, [ ]  Burning with urination, [ ]  Difficulty urinating Skin: [ ]  Rashes, [ ]  Wounds Psychological: [ ]  Anxiety, [ ]  Depression  Physical Examination Vitals:   02/07/17 0200 02/07/17 0246 02/07/17 0745 02/07/17 0758  BP: (!) 168/90 (!) 169/85 (!) 183/94 (!) 183/94  Pulse: (!) 106 100 87 87  Resp: (!) 30 20  20   Temp:  98.1 F (36.7 C)  98.3 F (36.8 C)  TempSrc:  Oral  Oral  SpO2: (!) 86% 98%  99%  Weight:      Height:  5\' 6"  (1.676 m)     Body mass index is 22.6 kg/m.  General:  WDWN in NAD  HENT: WNL Eyes: won't open eyes Pulmonary: normal non-labored breathing , without Rales, rhonchi,  wheezing Cardiac: RRR, without  Murmurs, rubs or gallops; No carotid bruits Abdomen: soft, NT, no masses Skin: no rashes, ulcers noted;  no Gangrene , no cellulitis; no open wounds;   Vascular Exam/Pulses:Right IJ clamped off.      Neurologic: non responsive SENSATION: non responsive MOTOR FUNCTION: not tested Speech  No verbal response    Significant Diagnostic Studies: CBC Lab Results  Component Value Date   WBC 8.3 02/07/2017   HGB 12.4 02/07/2017   HCT 40.2 02/07/2017   MCV 95.9 02/07/2017   PLT 154 02/07/2017    BMET    Component Value Date/Time   NA 136 02/07/2017 0431   NA 136 (A) 06/04/2015   K 4.8 02/07/2017 0431   CL 100 (L) 02/07/2017 0431   CO2 17 (L) 02/07/2017 0431   GLUCOSE 237 (H) 02/07/2017 0431   BUN 68 (H) 02/07/2017 0431   BUN 52 (A) 05/21/2015   CREATININE 8.90 (H)  02/07/2017 0431   CALCIUM 8.4 (L) 02/07/2017 0431   GFRNONAA 4 (L) 02/07/2017 0431   GFRAA 5 (L) 02/07/2017 0431   Estimated Creatinine Clearance: 6.4 mL/min (A) (by C-G formula based on SCr of 8.9 mg/dL (H)).  COAG Lab Results  Component Value Date   INR 1.12 02/07/2017   INR 1.29 07/19/2014   INR 1.17 03/20/2014     Non-Invasive Vascular Imaging:   ASSESSMENT/PLAN:  Left IJ catheter cut? Plan exchange left tunneled catheter as requested. Patient is NPO.   Theda Sers, [place] 02/07/2017 10:00 AM

## 2017-02-07 NOTE — Anesthesia Postprocedure Evaluation (Signed)
Anesthesia Post Note  Patient: Brandi Arellano  Procedure(s) Performed: INSERTION OF DIALYSIS CATHETER (Right Chest)     Patient location during evaluation: PACU Anesthesia Type: General Level of consciousness: awake and patient cooperative Pain management: pain level controlled Vital Signs Assessment: post-procedure vital signs reviewed and stable Respiratory status: spontaneous breathing, nonlabored ventilation and respiratory function stable Cardiovascular status: blood pressure returned to baseline and stable Postop Assessment: no apparent nausea or vomiting Anesthetic complications: no    Last Vitals:  Vitals:   02/07/17 1540 02/07/17 1600  BP: 117/66 118/63  Pulse: 69 73  Resp: 15 17  Temp:    SpO2: 92% 92%    Last Pain:  Vitals:   02/07/17 1540  TempSrc:   PainSc: [place]

## 2017-02-07 NOTE — H&P (Signed)
[facility] Admission History and Physical Service Pager: [phone]  Patient name: Brandi Arellano Medical record number: [id number] Date of birth: [birth year] Age: 61 y.o. Gender: female  Primary Care Provider: Kerin Perna, NP Consultants: Nephrology Code Status: partial code (DNI, will think about DNR but wants compressions for now)  Chief Complaint: Altered mental status/severed permcath  Assessment and Plan: Brandi Arellano is a 61 y.o. female with PMH of ESRD on MWF HD, T2DM, HLD, HTN, hypothyroidism, CAD, R AKA, gastroparesis presenting with altered mental status and trauma to permcath site. Patient's only complaint is abdominal pain, nausea, and vomiting. She states she has no idea how her permcath was cut and doesn't know anything about her altered mental status. Attempted to reach patient's brother via cell phone x2.  Altered Mental Status Patient presented with GCS of 6 per ED provider. Given 2mg  narcan and almost immediately returned to baseline mentation (brother confirms). By the time of FPTS eval, patient AOx3 and able to converse about appropriate medical history, confirmed per chart review. Most likely etiology is opiate overdose (patient denies intention for self harm) given resolution with Narcan. Gabapentin was the only possible sedating med found at scene. Discussed with pharmacy, this should not be reversed by Narcan. Given bladder mass (patient unaware of this) on CT, concern for complicated infection vs malignancy. AMS 2/2 sepsis possible, but less likely given normal lactate. UA grossly infected, although this may have been drawn from urine bag. Patient also with high risk for ams due to elevated BUN of 53 and short dialysis session on 10/26. Patient without elevated lfts so concern for hepatic encephalopathy is low. Patient is on increased oxygen in room (no o2 at baseline). This raises concern for perhaps aspiration pna in setting of ams.  CXR without any findings consistent although this can lag behind. Will hold any opiates and observe for further signs of infection. Cover with cefepime given high risk for drug resistant urinary pathogens with indwelling foley, ESRD. Would need to consider skin flora as infectious source should patient worsen given that she manipulated and cut her permcath, possibly introducing bacteria.  - admit to inpatient family medicine, appropriate for telemetry, Dr. Ardelia Mems - vital signs per floor routine - cardiac monitoring - urine drug screen - cbc, bmp in am - tylenol for pain, fever - CIWA protocol - cefepime, pharmacy to dose - routine hiv screening  Perm Cath trauma Patient found to have tip of permcath severed. Unclear who cut the tip of permcath although suspicion falls on patient as scissors and the tip were allegedly found under her bed. Nephrology aware of patient. Patient will need new access placed for dialysis. She will likely need dialysis immediately after access placed. Likely will need new permcath. Potentially can be unchanged by VVS over wire with the stump of the old one. Patient will need to keep hemostat on stump of existing permcath, although this has potentially clotted off at this point. - Follow up nephrology recommendations - likely need permcath exchange by VVS - npo at midnight, cbc, bmp, coags in am  Abdominal pain Patient with several week history of abdominal pain although this acutely worsened on 10/26. The pain is mostly in her RUQ although she did endorse some pain in LLQ as well. Patient's ct abdomen limited by lack of contrast. Patient has had lap chole in the past. Carries diagnosis of gastroparesis in her chart. Symptoms are consistent with diabetic gastroparesis although cannot accurately diagnose without upper gi study.  She does endorse 1-2 bms per day so this makes it less likely. Patient with mass found in bladder on abdominal ct. This is another potential cause  of her LLQ pain, although unclear why she has RUQ pain. Will give zofran and tylenol for relief. If symptoms fail to resolve can possibly consider GI consult for upper gi/colonoscopy.  - zofran for nausea - metaclopromide for breakthrough - Will consult urology 10/29 for bladder mass  Nausea and vomiting As above. Will continue zofran and metaclopromide prn for n/v. Some report from patient's brother that he has allegedly witnessed patient self-inducing vomiting. Possible secondary gain motivation as patient was in ED same day as admission but eloped after not receiving opiates.  -zofran and reglan as above  Shortness of breath Patient with shortness of breath while in room. Attempted to take off o2 with desat down to 88. Placed back on 2L. Most likely etiology is from pulmonary edema seen on cxr in setting of dialysis intolerance on 10/26. Unable to locate a past echo on this patient. No sure if she has some aspect of heart failure. Suspect this will resolve when finally able to successfully undergo dialysis. Wean o2 as tolerated. - dialysis when access placed - wean o2 as tolerated  ESRD Patient dialyzes MWF. No indications for emergent dialysis at this time. Creatinine 9. k 4.3. Bun 56. Patient with cut R permcath. Will need to be replaced prior to dialysis. Appreciate nephrology recs. - follow up nephrology recs - dialysis mwf as able  Bladder Mass Patient with a bladder mass in the left posterior base. Potentially the nidus for chronic uti. Large amount of hgb on last few UAs. Will consult urology for management. Will likely need cystoscopy at some point, either at this admission or as an outpatient. - consult urology 10/29  Potential suicide attempt Patient's family concerned that his may have been a suicide attempt. Patient denies any thoughts or plans of self-harm or harming others. Regardless will have a sitter at bedside at least until story can be clarified. Will likely need psych  consult during hospitalization. - suicide precautions - sitter at bedside - psych consult  Urinary Retention Patient with chronic indwelling foley 2/2 urinary retention. Has had recurrent UTI. S/p treatment with vantin. Patient with foul smelling urine and likely uti on ua. Will start on cefepime for pseudomonas coverage given her stated risk factors for very atypical uti. Continue foley for urinary retention. - cefepime, pharmacy to dose  H/O UTI As above  History of CVA   Patient with right thalamic infarct appreciated on head ct. Has no residual deficits. Already on high intensity statin and aspirin as outpt. - continue atorvastatin, asa 81  Type II diabetes Patient on novolog 5U as needed for high sugars as outpatient. Will place on sSSI while inpatinet. Last a1c 7.4 on 05/07/15. Will check while inpatient. Can start basal dose if difficult to control with short acting. Patient currently NPO, will need to monitor carefully.  - sSSI - q 4 hour checks - check a1c  Hyperlipidemia Patient currently on atorvastatin. No lipid panel in system. Continue atorvastatin. Will need lipid panel drawn as outpatient by pcp. - continue atorvastatin - lipid panel at pcp follow up  Hypertension Patient with norvasc 5mg  daily at home. Current pressure 177/98. Hr 106. Will give 1 dose labetolol 5mg . - continue home norvasc - prn labetolol or hydralazine for systolic>170  Hypothyroidism Continue home synthroid 56mcg daily  Right Above Knee Amputation Patient with no  skin breakdown at stump site. Has a nervous tic where she shakes this stump apparently due to phantom limb pain. Will monitor for any s/s developing ulcer. Will hold off on gabapentin for now given ams and potential od on gabapentin.  Left transmetatarsal amputation Stump site without any abnormalities. C/D/i with no skin breakdown.  Diabetic Gastroparesis Will likely need upper GI study as outpatient. Could potentially benefit  from vagal nerve stimulator. If abdominal pain does not resolve can consider upper gi while here.  FEN/GI: npo Prophylaxis: heparin 5000U q8 hours  Disposition: home vs snf  History of Present Illness:  Brandi Arellano is a 61 y.o. female presenting with altered mental status and trauma to permcath site. Attempted to reach patient's brother at number listed x2 for clarification on history. Went straight to voicemail, will attempt to reach again.   Patient states that she was having nausea and vomiting 10/28. At some point during the afternoon/evening she became acutely altered. She yelled out for her brother who was at the house. When he arrived he found her with her shirt covered in bright red blood. He immediately called 911. She was found to have the tip of her Permcath cut. The end of this was clamped with a hemostat. When she arrived at the ed her GCS was a 6 per report by the ed physician. She was given narcan 1mg  which improved her mental status "a little". She was given another 1mg  which caused her to return to her baseline. No narcotics were found at the patient's house. The tip of the patient's dialysis catheter, " a large bottle of gabapentin", and scissors were found under the patient's bed.   Regarding patient's abdominal pain. She states that this first started on 10/26 after dialysis. It has been slowly worsening since that time. She describes it as a sharp, stabbing pain in her RUQ. She has had related emesis for the past several days. It has been NBNB and has been mostly clear. She has had very limited PO intake. She states that nothing has helped her vomiting. Per reports from nursing staff. Patient's brother says that he has seen patient self-inducing vomiting. Patient says that she has had pain like this before. It has happened intermittently since she started dialysis. Patient does not know why her permcath with bleeding in the ed. Patient denies any opioids with the only medications  that she took today are "what the doctor prescribed to her".  She states these are antibiotics, bp meds, and thyroid medication.  Also of note the patient has had a chronic indwelling foley for the past two weeks. She states the foley was started due to urinary retention. Patient's urine with foul odor. Was already seen in the ed 10/8 for a UTI. Was started on vantin at that time. UA from that visit grew multiple species.  The rest of the workup in the ed consisted of a cmp, cbc, lactic acid, ct abdomen, ct head, abdominal xray, chest xray, ekg. CMP significant for cr 7.9, bun 53, ca 8.4, alb 3.0, lipase 116. Cbc without abnormality. LA 1.44, ct abdomen significant for polypoid mass in posterior base of bladder. CT head significant for old basal gangla and right thalamus lacunar infarcts. Cxr showed worsening pulm edema. AXR showed stable cardiomegaly, nonspecific bowel gas pattern. EKG NSR, with LVH.    Review Of Systems: Per HPI with the following additions:  Review of Systems  Constitutional: Negative for chills and fever.  HENT: Negative for congestion.  Respiratory: Negative for cough.   Cardiovascular: Negative for chest pain.  Gastrointestinal: Positive for abdominal pain, nausea and vomiting. Negative for constipation and diarrhea.  Musculoskeletal: Negative for myalgias.  Psychiatric/Behavioral: Negative for depression and suicidal ideas.    Patient Active Problem List   Diagnosis Date Noted  . Axillary mass, left 07/01/2016  . Dupuytren's contracture of left hand 05/07/2016  . HLD (hyperlipidemia) 08/04/2015  . Conjunctivitis 08/04/2015  . Insomnia 07/09/2015  . Gangrene associated with diabetes mellitus ([place]) 03/20/2014  . Gastroparesis due to DM ([place]) 02/12/2014  . Status post above knee amputation, right ([place]) 02/08/2014  . Buerger's disease ([place]) 02/08/2014  . Hyperparathyroidism, secondary renal ([place]) 02/06/2014  . Hyperphosphatemia 02/06/2014  . Anemia in chronic  kidney disease 02/06/2014  . 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 Essential hypertension 12/26/2013  . DM type 2, uncontrolled, with renal complications ([place]) 16/01/9603  . CKD (chronic kidney disease) 12/26/2013  . Hypothyroidism 12/26/2013  . CAD (coronary artery disease) 12/26/2013  . End stage renal disease ([place]) 12/14/2013    Past Medical History: Past Medical History:  Diagnosis Date  . Anemia   . CAD (coronary artery disease)    CABG  . CHF (congestive heart failure) (HCC)    Acute on chronic diastolic  . Complication of anesthesia   . Diabetes mellitus without complication (HCC)    Type II  . DVT (deep venous thrombosis) ([place])   . ESRD (end stage renal disease) ([place])    HD M-W-F  . Gangrene of lower extremity (HCC)    RLE  . GERD (gastroesophageal reflux disease)   . Hx of AKA (above knee amputation), right ([place])   . Hyperlipidemia   . Hypertension   . Hypothyroidism   . PONV (postoperative nausea and vomiting)   . Stroke [facility])    "light stroke" no deficits  . Thrombocytopenia ([place])   . Thyroid disease    Abnormal Thyroid function Test  . UTI (lower urinary tract infection)     Past Surgical History: Past Surgical History:  Procedure Laterality Date  . ABDOMINAL ANGIOGRAM  12/26/2013   Procedure: ABDOMINAL ANGIOGRAM;  Surgeon: Serafina Caporaso, MD;  Location: [facility] CATH LAB;  Service: Cardiovascular;;  . AMPUTATION Right 02/01/2014   Procedure: Right Below Knee Amputation;  Surgeon: Newt Minion, MD;  Location: [place];  Service: Orthopedics;  Laterality: Right;  . AMPUTATION Right 03/24/2014   Procedure: AMPUTATION ABOVE KNEE;  Surgeon: Newt Minion, MD;  Location: [place];  Service: Orthopedics;  Laterality: Right;  . ARCH AORTOGRAM  12/26/2013   Procedure: ARCH AORTOGRAM;  Surgeon: Serafina Rapaport, MD;  Location: [facility] CATH LAB;  Service: Cardiovascular;;  . CAROTID ENDARTERECTOMY     Bilateral  . CHOLECYSTECTOMY    . CORONARY  ARTERY BYPASS GRAFT    . FOOT AMPUTATION Bilateral   . HARDWARE REMOVAL Right 07/19/2014   Procedure: Removal Deep Hardware Right Femur;  Surgeon: Newt Minion, MD;  Location: [facility];  Service: Orthopedics;  Laterality: Right;  Six screws and one condylar plate removed   . hemodialysis catheter    . LOWER EXTREMITY ANGIOGRAM N/A 12/26/2013   Procedure: LOWER EXTREMITY ANGIOGRAM;  Surgeon: Serafina Sol, MD;  Location: [facility] CATH LAB;  Service: Cardiovascular;  Laterality: N/A;  . UPPER EXTREMITY ANGIOGRAM Right 12/26/2013   Procedure: UPPER EXTREMITY ANGIOGRAM;  Surgeon: Serafina Labine, MD;  Location: [facility] CATH LAB;  Service: Cardiovascular;  Laterality: Right;  Social History: Social History  Substance Use Topics  . Smoking status: Former Smoker    Years: 15.00    Types: Cigarettes  . Smokeless tobacco: Never Used     Comment: quit in 2002  . Alcohol use No   Additional social history:   Please also refer to relevant sections of EMR.  Family History: Family History  Problem Relation Age of Onset  . Adopted: Yes  . Hypertension Father   . Heart disease Father        Coronary Artery Disease    Allergies and Medications: Allergies  Allergen Reactions  . Diphenhydramine Nausea And Vomiting  . Tramadol Hcl Nausea And Vomiting  . Morphine And Related Nausea And Vomiting   Current Facility-Administered Medications on File Prior to Encounter  Medication Dose Route Frequency Provider Last Rate Last Dose  . 0.9 %  sodium chloride infusion    Continuous PRN Laretta Alstrom, CRNA       Current Outpatient Prescriptions on File Prior to Encounter  Medication Sig Dispense Refill  . amLODipine (NORVASC) 5 MG tablet Take 5 mg by mouth daily.    Marland Kitchen aspirin EC 81 MG tablet Take 81 mg by mouth 2 (two) times daily.     Marland Kitchen atorvastatin (LIPITOR) 40 MG tablet Take 40 mg by mouth every evening.     . cefpodoxime (VANTIN) 200 MG tablet Take 1 tab PO after dialysis for 4 doses (Patient taking  differently: Take 200 mg by mouth every 12 (twelve) hours. after dialysis) 4 tablet 0  . insulin aspart (NOVOLOG) 100 UNIT/ML injection Inject 5 Units into the skin. Check BS twice daily : If >150 inject 5 units    . lanthanum (FOSRENOL) 1000 MG chewable tablet Chew 1,000 mg by mouth See admin instructions. Pt to take 1 tablet with meals three times daily and 1 tablet with snacks    . levothyroxine (SYNTHROID, LEVOTHROID) 25 MCG tablet Take 25 mcg by mouth daily before breakfast.      Objective: BP (!) 172/88   Pulse (!) 117   Temp (!) 97.1 F (36.2 C) (Tympanic)   Resp 19   Ht 5\' 6"  (1.676 m)   Wt 140 lb (63.5 kg)   SpO2 97%   BMI 22.60 kg/m  Exam: General: alert, oriented x4. Ill-appearing female. Rocking back and forth in bed, appears to be in some distress. Eyes: eomi, perrla. No conjunctival injections bilaterally. ENTM: external ears, nose negative for any trauma. No cervical adenopathy. Scar across left strap muscle from previous thyroid surgery Cardiovascular: tachycardic, 2/6 systolic ejection murmur. Palpable radial artery bilaterally. Patient with Right IJ permcath in place. Tip has been cut off. Hemostat in place. Respiratory: coarse breath sounds bilaterally. No increased work of breathing. 2L [place]. Gastrointestinal: soft, non-distended. Tender to deep palpation RUQ, LLQ. No rebound tenderness. Multiple surgical scars noted. MSK: Patient with Right BKA. Able to move stump, sensation intact. Patient with Left transmetatarsal amputation. Decreased sensation from knee down. 5/5 strength all muscle groups. 5/5 strength BUE. Derm: warm and dry Neuro: AOx4, CN 2-12 intact. 5/5 strength BUE, LLE. Psych: denies any SI/HI. Appropriate.  Labs and Imaging: CBC BMET   Recent Labs Lab 02/06/17 2000 02/06/17 2002  WBC 10.3  --   HGB 12.9 15.3*  HCT 41.1 45.0  PLT 163  --     Recent Labs Lab 02/06/17 2000 02/06/17 2002  NA 135 137  K 4.4 4.3  CL 96* 101  CO2 19*  --  BUN  65* 56*  CREATININE 8.81* 9.10*  GLUCOSE 236* 242*  CALCIUM 9.2  --       Guadalupe Dawn, MD 02/07/2017, 12:25 AM PGY-1, [place] Intern pager: [phone], text pages welcome  FPTS Upper-Level Resident Addendum  I have independently interviewed and examined the patient. I have discussed the above with the original author and agree with their documentation. My edits for correction/addition/clarification are in blue. Please see also any attending notes.   Ralene Ok, MD PGY-2, [place] Service pager: [phone] (text pages welcome through [facility])

## 2017-02-07 NOTE — Progress Notes (Signed)
New Admission Note:   Arrival Method:  Via stretcher from the ED Mental Orientation:  A & O x 3 Telemetry:   Tele #[id number] Assessment: Completed Skin:  See Assessment IV:  See Assessment/Below Note Pain:  See Assessment Tubes:  None Safety Measures: Safety Fall Prevention Plan has been given, discussed and signed Admission: Completed [address] [place] Orientation: Patient has been orientated to the room, unit and staff.  Family:  None at Bedside  Patient arrived from the ED via stretcher.  Our NT became Suicide Sitter to the patient.  Patient oriented to the unit.  Denies intention to harm self.  Right perm cath open to air with hemostat clamping the end off.  Sterile 4 x 4 applied and tape applied to protect site.  Per primary, renal has been made aware.  Patient complaining of abdominal pain, leg pain, and back pain.  MD called and made aware.  New orders received and implemented.  Security called to room.  Only belongings that patient brought to room was one shoe.  Her prescriptions were counted and will be taken to pharmacy.  All suicide procedures implemented.  Orders have been reviewed and implemented. Will continue to monitor the patient. Call light has been placed within reach and bed alarm has been activated.   Earleen Reaper RN- [place], [place] Phone number: [phone]

## 2017-02-07 NOTE — Consult Note (Addendum)
Subjective: CC: Voiding difficulty.  Hx:  Brandi Arellano is a 61 yo female who was admitted with abdominal pain and AMS.  I was asked to see her in consultation by Dr. Wendee Beavers for the finding of a bladder mass on CT that was 3x2x3cm at the right bladder base with associated diffuse bladder wall thickening.  Prior CT in September was suggestive of similar findings.  The patient reports a 2 month history of some progressive voiding difficulty with painful voiding but no hematuria.   She has long standing DM with ESRD and has been on dialysis for 3 years with resultant oliguria.  She has recently been treated for UTI's but the cultures in the chart have had Mx Species.   She has no history of GU surgery, stones or other malignancies.   She was smoker for 30 years.   ROS:  Review of Systems  Constitutional: Negative for chills and fever.  Respiratory: Negative for shortness of breath.   Cardiovascular: Negative for chest pain.  Gastrointestinal: Positive for abdominal pain. Negative for blood in stool and constipation.  Genitourinary: Positive for dysuria. Negative for flank pain and hematuria.       Difficulty initiating the stream.  Psychiatric/Behavioral: Positive for suicidal ideas.  All other systems reviewed and are negative.   Allergies  Allergen Reactions  . Diphenhydramine Nausea And Vomiting  . Tramadol Hcl Nausea And Vomiting  . Morphine And Related Nausea And Vomiting    Past Medical History:  Diagnosis Date  . Anemia   . CAD (coronary artery disease)    CABG  . CHF (congestive heart failure) (HCC)    Acute on chronic diastolic  . Complication of anesthesia   . Diabetes mellitus without complication (HCC)    Type II  . DVT (deep venous thrombosis) ([place])   . ESRD (end stage renal disease) ([place])    HD M-W-F  . Gangrene of lower extremity (HCC)    RLE  . GERD (gastroesophageal reflux disease)   . Hx of AKA (above knee amputation), right ([place])   . Hyperlipidemia   .  Hypertension   . Hypothyroidism   . PONV (postoperative nausea and vomiting)   . Stroke [facility])    "light stroke" no deficits  . Thrombocytopenia ([place])   . Thyroid disease    Abnormal Thyroid function Test  . UTI (lower urinary tract infection)     Past Surgical History:  Procedure Laterality Date  . ABDOMINAL ANGIOGRAM  12/26/2013   Procedure: ABDOMINAL ANGIOGRAM;  Surgeon: Serafina Martis, MD;  Location: [facility] CATH LAB;  Service: Cardiovascular;;  . AMPUTATION Right 02/01/2014   Procedure: Right Below Knee Amputation;  Surgeon: Newt Minion, MD;  Location: [place];  Service: Orthopedics;  Laterality: Right;  . AMPUTATION Right 03/24/2014   Procedure: AMPUTATION ABOVE KNEE;  Surgeon: Newt Minion, MD;  Location: [place];  Service: Orthopedics;  Laterality: Right;  . ARCH AORTOGRAM  12/26/2013   Procedure: ARCH AORTOGRAM;  Surgeon: Serafina Leonhard, MD;  Location: [facility] CATH LAB;  Service: Cardiovascular;;  . CAROTID ENDARTERECTOMY     Bilateral  . CHOLECYSTECTOMY    . CORONARY ARTERY BYPASS GRAFT    . FOOT AMPUTATION Bilateral   . HARDWARE REMOVAL Right 07/19/2014   Procedure: Removal Deep Hardware Right Femur;  Surgeon: Newt Minion, MD;  Location: [place];  Service: Orthopedics;  Laterality: Right;  Six screws and one condylar plate removed   . hemodialysis catheter    .  LOWER EXTREMITY ANGIOGRAM N/A 12/26/2013   Procedure: LOWER EXTREMITY ANGIOGRAM;  Surgeon: Serafina Hirt, MD;  Location: [facility] CATH LAB;  Service: Cardiovascular;  Laterality: N/A;  . UPPER EXTREMITY ANGIOGRAM Right 12/26/2013   Procedure: UPPER EXTREMITY ANGIOGRAM;  Surgeon: Serafina Shetterly, MD;  Location: [facility] CATH LAB;  Service: Cardiovascular;  Laterality: Right;    Social History   Social History  . Marital status: Divorced    Spouse name: N/A  . Number of children: N/A  . Years of education: N/A   Occupational History  . Not on file.   Social History Main Topics  . Smoking status: Former Smoker    Years: 15.00     Types: Cigarettes  . Smokeless tobacco: Never Used     Comment: quit in 2002  . Alcohol use No  . Drug use: No  . Sexual activity: Not on file   Other Topics Concern  . Not on file   Social History Narrative  . No narrative on file    Family History  Problem Relation Age of Onset  . Adopted: Yes  . Hypertension Father   . Heart disease Father        Coronary Artery Disease    Anti-infectives: Anti-infectives    Start     Dose/Rate Route Frequency Ordered Stop   02/07/17 2000  ceFEPIme (MAXIPIME) 2 g in dextrose 5 % 50 mL IVPB     2 g 100 mL/hr over 30 Minutes Intravenous Every M-W-F (2000) 02/07/17 0144     02/07/17 0145  ceFEPIme (MAXIPIME) 2 g in dextrose 5 % 50 mL IVPB     2 g 100 mL/hr over 30 Minutes Intravenous  Once 02/07/17 0137 02/07/17 5686      Current Facility-Administered Medications  Medication Dose Route Frequency Provider Last Rate Last Dose  . 0.9 %  sodium chloride infusion   Intravenous Continuous Audry Pili, MD 10 mL/hr at 02/07/17 1231    . acetaminophen (TYLENOL) tablet 650 mg  650 mg Oral Q6H PRN Sela Hilding, MD   650 mg at 02/07/17 1683   Or  . acetaminophen (TYLENOL) suppository 650 mg  650 mg Rectal Q6H PRN Sela Hilding, MD      . amLODipine (NORVASC) tablet 5 mg  5 mg Oral Daily Sela Hilding, MD   5 mg at 02/07/17 0810  . aspirin EC tablet 81 mg  81 mg Oral BID Sela Hilding, MD   81 mg at 02/07/17 0810  . atorvastatin (LIPITOR) tablet 40 mg  40 mg Oral QPM Sela Hilding, MD      . ceFEPIme (MAXIPIME) 2 g in dextrose 5 % 50 mL IVPB  2 g Intravenous Q M,W,F-2000 Erenest Blank, RPH      . chlorhexidine (PERIDEX) 0.12 % solution 15 mL  15 mL Mouth Rinse BID Leeanne Rio, MD      . docusate sodium (COLACE) capsule 100 mg  100 mg Oral Daily Guadalupe Dawn, MD      . doxercalciferol (HECTOROL) injection 4 mcg  4 mcg Intravenous Q M,W,F-HD Ejigiri, Thomos Lemons, PA-C      . folic acid (FOLVITE)  tablet 1 mg  1 mg Oral Daily Sela Hilding, MD   1 mg at 02/07/17 0810  . gabapentin (NEURONTIN) capsule 100 mg  100 mg Oral BID Wendee Beavers T, MD   100 mg at 02/07/17 1131  . heparin injection 5,000 Units  5,000 Units Subcutaneous Q8H Sela Hilding, MD      .  insulin aspart (novoLOG) injection 0-9 Units  0-9 Units Subcutaneous Q4H Guadalupe Dawn, MD   3 Units at 02/07/17 0446  . lanthanum (FOSRENOL) chewable tablet 1,000 mg  1,000 mg Oral TID WC Ejigiri, Thomos Lemons, PA-C      . levothyroxine (SYNTHROID, LEVOTHROID) tablet 25 mcg  25 mcg Oral QAC breakfast Sela Hilding, MD   25 mcg at 02/07/17 0810  . LORazepam (ATIVAN) tablet 1 mg  1 mg Oral Q6H PRN Sela Hilding, MD   1 mg at 02/07/17 0809   Or  . LORazepam (ATIVAN) injection 1 mg  1 mg Intravenous Q6H PRN Sela Hilding, MD      . MEDLINE mouth rinse  15 mL Mouth Rinse q12n4p Leeanne Rio, MD      . multivitamin with minerals tablet 1 tablet  1 tablet Oral Daily Sela Hilding, MD   1 tablet at 02/07/17 0809  . polyethylene glycol (MIRALAX / GLYCOLAX) packet 17 g  17 g Oral BID Gonfa, Taye T, MD      . sodium chloride flush (NS) 0.9 % injection 3 mL  3 mL Intravenous Q12H Sela Hilding, MD   3 mL at 02/07/17 0811  . thiamine (VITAMIN B-1) tablet 100 mg  100 mg Oral Daily Sela Hilding, MD   100 mg at 02/07/17 0809   Facility-Administered Medications Ordered in Other Encounters  Medication Dose Route Frequency Provider Last Rate Last Dose  . 0.9 %  sodium chloride infusion    Continuous PRN Laretta Alstrom, CRNA         Objective: Vital signs in last 24 hours: Temp:  [97.1 F (36.2 C)-98.3 F (36.8 C)] 98 F (36.7 C) (10/29 1632) Pulse Rate:  [69-117] 88 (10/29 1632) Resp:  [10-30] 17 (10/29 1632) BP: (99-186)/(56-128) 116/68 (10/29 1632) SpO2:  [86 %-100 %] 92 % (10/29 1632) Weight:  [63.5 kg (140 lb)] 63.5 kg (140 lb) (10/29 1225)  Intake/Output from previous day: 10/28  0701 - 10/29 0700 In: 30 [P.O.:30] Out: 0  Intake/Output this shift: Total I/O In: 400 [I.V.:400] Out: 10 [Blood:10]   Physical Exam  Constitutional: She is oriented to person, place, and time and well-developed, well-nourished, and in no distress.  HENT:  Head: Normocephalic and atraumatic.  Neck: Normal range of motion. Neck supple.  Dialysis catheter on the right  Cardiovascular: Normal rate and regular rhythm.   Murmur heard. Pulmonary/Chest: Effort normal and breath sounds normal. No respiratory distress.  Abdominal: Soft. Bowel sounds are normal. She exhibits no distension and no mass. There is no tenderness.  Musculoskeletal: Normal range of motion. She exhibits no edema or tenderness.  Right AKA  Lymphadenopathy:    She has no cervical adenopathy.    She has no axillary adenopathy.       Right: No inguinal and no supraclavicular adenopathy present.       Left: No inguinal and no supraclavicular adenopathy present.  Neurological: She is alert and oriented to person, place, and time.  Skin: Skin is warm and dry.  Psychiatric: Mood and affect normal.    Lab Results:   Recent Labs  02/06/17 2000 02/06/17 2002 02/07/17 0431  WBC 10.3  --  8.3  HGB 12.9 15.3* 12.4  HCT 41.1 45.0 40.2  PLT 163  --  154   BMET  Recent Labs  02/06/17 2000 02/06/17 2002 02/07/17 0431  NA 135 137 136  K 4.4 4.3 4.8  CL 96* 101 100*  CO2 19*  --  17*  GLUCOSE 236* 242* 237*  BUN 65* 56* 68*  CREATININE 8.81* 9.10* 8.90*  CALCIUM 9.2  --  8.4*   PT/INR  Recent Labs  02/07/17 0431  LABPROT 14.3  INR 1.12   ABG No results for input(s): PHART, HCO3 in the last 72 hours.  Invalid input(s): PCO2, PO2  Studies/Results: Ct Head Wo Contrast  Result Date: 02/06/2017 CLINICAL DATA:  Altered level of consciousness. History of stroke, carotid endarterectomy, hypertension, hyperlipidemia, diabetes. EXAM: CT HEAD WITHOUT CONTRAST TECHNIQUE: Contiguous axial images were  obtained from the base of the skull through the vertex without intravenous contrast. COMPARISON:  CT HEAD November 11, 2016 FINDINGS: BRAIN: No intraparenchymal hemorrhage, mass effect nor midline shift. Patchy supratentorial white matter hypodensities. Old RIGHT putaminal and LEFT caudate and RIGHT thalamus lacunar infarcts. Mild ex vacuo dilatation LEFT frontal horn of lateral ventricle. No hydrocephalus. No acute large vascular territory infarcts. No abnormal extra-axial fluid collections. Basal cisterns are patent. VASCULAR: Severe calcific atherosclerosis of the carotid siphons and intradural vertebral artery's. SKULL: No skull fracture. No significant scalp soft tissue swelling. SINUSES/ORBITS: The mastoid air-cells and included paranasal sinuses are well-aerated. Soft tissue within LEFT external auditory canal compatible with cerumen. The included ocular globes and orbital contents are non-suspicious. Status post LEFT ocular lens implant. OTHER: None. IMPRESSION: 1. No acute intracranial process. 2. Mild chronic small vessel ischemic disease. Old basal ganglia and RIGHT thalamus lacunar infarcts. 3. Severe atherosclerosis. Electronically Signed   By: Elon Alas M.D.   On: 02/06/2017 22:31   Ct Abdomen Pelvis W Contrast  Result Date: 02/06/2017 CLINICAL DATA:  61 year old female with abdominal pain. History of UTI and diabetes. Patient is on dialysis. EXAM: CT ABDOMEN AND PELVIS WITH CONTRAST TECHNIQUE: Multidetector CT imaging of the abdomen and pelvis was performed using the standard protocol following bolus administration of intravenous contrast. CONTRAST:  160mL ISOVUE-300 IOPAMIDOL (ISOVUE-300) INJECTION 61% COMPARISON:  Abdominal CT dated 01/01/2017 FINDINGS: Lower chest: Partially visualized moderate bilateral pleural effusions, new compared to the CT of 01/01/2017. There is diffuse interstitial prominence consistent with pulmonary edema. Bibasilar airspace densities may represent atelectatic  changes versus infiltrate. Multi vessel coronary vascular calcification noted. There is no intra-abdominal free air. There is a small free fluid within the pelvis. Hepatobiliary: The liver is unremarkable. There is mild dilatation of the intrahepatic biliary tree as well as common bile duct which may be related to post cholecystectomy changes. However if there has been interval increase in the dilatation of the intrahepatic biliary tree which may represent some periportal edema. The common bile duct measures up to 13 mm in diameter. No retained calcified stone noted in the central CBD. Pancreas: Unremarkable. No pancreatic ductal dilatation or surrounding inflammatory changes. Spleen: Normal in size without focal abnormality. Adrenals/Urinary Tract: The adrenal glands are unremarkable. There is moderate bilateral renal parenchyma atrophy. Bilateral renovascular calcification noted. There is no hydronephrosis on either side. There is diffusely thickened and trabecular appearance of the bladder wall. There is a 3 x 2 x 3 cm heterogeneously enhancing mass arising from the posterior base of the bladder and to the right of the midline. There is associated mass effect and displacement of the balloon of the Foley catheter to the left. Findings most consistent with a neoplastic process possibly a transitional cell carcinoma. Further evaluation with cystoscopy is recommended. Diffuse thickening of the bladder wall with perivesical stranding may be related to infiltrative tumor or represent acute cystitis. Correlation with urinalysis recommended. Air within the urinary  bladder, likely introduced via Foley catheter. Stomach/Bowel: Evaluation of the bowel is limited in the absence of oral contrast. Diffuse thickened appearance of the colon likely related to underdistention. Correlation with clinical exam is recommended to exclude colitis. There is no bowel obstruction. The appendix is normal. Vascular/Lymphatic: There is  advanced aortoiliac atherosclerotic disease. There is a 2.4 cm infrarenal aortic ectasia. The IVC is unremarkable. No portal venous gas identified. No adenopathy. Reproductive: The uterus is retroflexed and grossly unremarkable. The ovaries are poorly visualized. Other: Mild diffuse subcutaneous edema and anasarca. Musculoskeletal: No acute osseous pathology. IMPRESSION: 1. Polypoid mass arising from the posterior base of the bladder highly concerning for a neoplastic process. Diffuse thickened appearance of the bladder wall may be related to cystitis or infiltration of tumor. Correlation with urinalysis recommended. Further evaluation with cystoscopy, following resolution of acute inflammation, recommended. 2. Diffusely thickened: Likely related to underdistention. Clinical correlation is recommended to exclude colitis. No bowel obstruction. Normal appendix. 3. Partially visualized moderate bilateral pleural effusions, pulmonary edema, small ascites, and anasarca. 4.  Aortic Atherosclerosis (ICD10-I70.0). 5. A 2.4 cm infrarenal aortic ectasia. Electronically Signed   By: Anner Crete M.D.   On: 02/06/2017 23:57   US Abdomen Limited  Result Date: 02/07/2017 CLINICAL DATA:  Right upper quadrant pain for 2 days EXAM: ULTRASOUND ABDOMEN LIMITED RIGHT UPPER QUADRANT COMPARISON:  CT 02/06/2017 FINDINGS: Gallbladder: Prior cholecystectomy Common bile duct: Diameter: Mildly prominent, 9 mm, likely related to post cholecystectomy state. Liver: No focal lesion identified. Within normal limits in parenchymal echogenicity. Portal vein is patent on color Doppler imaging with normal direction of blood flow towards the liver. Incidentally noted is increased echotexture throughout the right kidney suggesting chronic medical renal disease. Right pleural effusion noted. IMPRESSION: Mild prominence of the common bile duct, likely related to post cholecystectomy state. Small right pleural effusion. Increased echotexture  throughout the right kidney compatible with chronic medical renal disease. Electronically Signed   By: Rolm Baptise M.D.   On: 02/07/2017 10:30   Dg Chest Port 1 View  Result Date: 02/07/2017 CLINICAL DATA:  Status post dialysis catheter placement EXAM: PORTABLE CHEST 1 VIEW COMPARISON:  02/06/2017 FINDINGS: Right-sided dialysis catheter is in place with tips in the right atrium. No pneumothorax identified. Mild cardiac enlargement. Bilateral pleural effusions and pulmonary edema are similar to previous exam IMPRESSION: 1. No change in pulmonary edema and pleural effusions 2. No complications after right-sided dialysis catheter placement. Electronically Signed   By: Kerby Moors M.D.   On: 02/07/2017 15:43   Dg Chest Portable 1 View  Result Date: 02/06/2017 CLINICAL DATA:  Sever Port-A-Cath EXAM: PORTABLE CHEST 1 VIEW COMPARISON:  February 06, 2017 FINDINGS: The patient's double-lumen PermCath appears to be clamped with a hemostat. No pneumothorax. Worsening pulmonary edema. Cardiomegaly. No other interval changes. IMPRESSION: 1. Worsening pulmonary edema.  PermCath as above. Electronically Signed   By: Dorise Bullion III M.D   On: 02/06/2017 20:58   Dg Abd Acute W/chest  Result Date: 02/06/2017 CLINICAL DATA:  Abdominal pain for 2 hours. Nausea and diarrhea. History of end-stage renal disease on dialysis. EXAM: DG ABDOMEN ACUTE W/ 1V CHEST COMPARISON:  Chest radiograph November 11, 2016 an CT abdomen and pelvis November 11, 2016 FINDINGS: Cardiac silhouette is mildly enlarged and unchanged. Calcified aortic knob. Status post median sternotomy for CABG. Vascular stent projects at aortic arch. Diffuse interstitial prominence slightly worse than prior radiograph without pleural effusion or focal consolidation. Apical pleural thickening. No pneumothorax. Surgical clips in  the neck consistent with history of carotid endarterectomy. Vascular clips LEFT arm. Tunneled dialysis catheter via RIGHT internal jugular  venous approach with distal tip projecting cavoatrial junction. Bowel gas pattern is nondilated and nonobstructive. Overall paucity of bowel gas. Extensive vascular calcifications is seen on prior CT. Surgical clips in the included right abdomen compatible with cholecystectomy. No intra-abdominal mass effect. No intraperitoneal free air. Soft tissue planes included osseous structures are nonacute. IMPRESSION: Stable cardiomegaly.  Worsening interstitial edema. Nonspecific bowel gas pattern. Electronically Signed   By: Elon Alas M.D.   On: 02/06/2017 04:33   Dg Fluoro Guide Cv Line-no Report  Result Date: 02/07/2017 Fluoroscopy was utilized by the requesting physician.  No radiographic interpretation.   I have discussed the case with Dr. Cyndia Skeeters and reviewed the CT films and report and discuss the findings with the patient.   I have reviewed her labs including her UA and culture.      Assessment: Bladder mass with voiding difficulty.   She needs to have cystoscopy with EUA and TURBT with bilateral retrogrades.   I have reviewed the risks of bleeding, infection, bladder and ureteral injury, thrombotic events and anesthetic complications.    I will try to arrange this in the next 1-2 weeks at [facility] hospital.   Possible UTI.  Culture pending and patient on Cefipime.   The UA findings could just be from the mass.     CC: Dr. Wendee Beavers.    I have contacted Dr. Acie Fredrickson for Cardiac Clearance for surgery.   Jdyn Parkerson J 02/07/2017 [phone]

## 2017-02-07 NOTE — Progress Notes (Signed)
The CT Supervisor spoke to [facility]. The patient's arm is fine and does not have any increased swelling. She has CT contact information if any questions.

## 2017-02-07 NOTE — Progress Notes (Signed)
Arrived at the unit to follow up with patient.  Per nurse, patient gone to OR and then will be having dialysis.  Wanted to visit with patient to provide spiritual support if desired. Will check back once patient is complete in dialysis.  Please page Chaplain if needed.  Thank you .    02/07/17 1347  Clinical Encounter Type  Visited With Health care provider (patients current RN)

## 2017-02-07 NOTE — ED Notes (Signed)
Staffing office made aware of sitter need and SI precautions

## 2017-02-07 NOTE — Transfer of Care (Signed)
Immediate Anesthesia Transfer of Care Note  Patient: Brandi Arellano  Procedure(s) Performed: INSERTION OF DIALYSIS CATHETER (Right Chest)  Patient Location: PACU  Anesthesia Type:General  Level of Consciousness: drowsy  Airway & Oxygen Therapy: Patient Spontanous Breathing and Patient connected to face mask oxygen  Post-op Assessment: Report given to RN and Post -op Vital signs reviewed and stable  Post vital signs: Reviewed and stable  Last Vitals:  Vitals:   02/07/17 0758 02/07/17 1412  BP: (!) 183/94 (!) 99/56  Pulse: 87 73  Resp: 20 15  Temp: 36.8 C 36.5 C  SpO2: 99% 96%    Last Pain:  Vitals:   02/07/17 1412  TempSrc:   PainSc: (P) Asleep      Patients Stated Pain Goal: 2 (72/[id number]/66 [id number])  Complications: No apparent anesthesia complications

## 2017-02-08 ENCOUNTER — Inpatient Hospital Stay (HOSPITAL_COMMUNITY): Payer: Medicaid Other

## 2017-02-08 ENCOUNTER — Encounter (HOSPITAL_COMMUNITY): Payer: Self-pay | Admitting: Vascular Surgery

## 2017-02-08 DIAGNOSIS — E039 Hypothyroidism, unspecified: Secondary | ICD-10-CM

## 2017-02-08 DIAGNOSIS — Z992 Dependence on renal dialysis: Secondary | ICD-10-CM

## 2017-02-08 DIAGNOSIS — I5030 Unspecified diastolic (congestive) heart failure: Secondary | ICD-10-CM

## 2017-02-08 DIAGNOSIS — F4320 Adjustment disorder, unspecified: Secondary | ICD-10-CM

## 2017-02-08 DIAGNOSIS — I350 Nonrheumatic aortic (valve) stenosis: Secondary | ICD-10-CM

## 2017-02-08 DIAGNOSIS — N186 End stage renal disease: Secondary | ICD-10-CM

## 2017-02-08 DIAGNOSIS — I132 Hypertensive heart and chronic kidney disease with heart failure and with stage 5 chronic kidney disease, or end stage renal disease: Secondary | ICD-10-CM

## 2017-02-08 DIAGNOSIS — Z87891 Personal history of nicotine dependence: Secondary | ICD-10-CM

## 2017-02-08 DIAGNOSIS — E1122 Type 2 diabetes mellitus with diabetic chronic kidney disease: Secondary | ICD-10-CM

## 2017-02-08 LAB — RENAL FUNCTION PANEL
ALBUMIN: 2.7 g/dL — AB (ref 3.5–5.0)
ANION GAP: 14 (ref 5–15)
BUN: 81 mg/dL — ABNORMAL HIGH (ref 6–20)
CALCIUM: 7.5 mg/dL — AB (ref 8.9–10.3)
CO2: 20 mmol/L — AB (ref 22–32)
Chloride: 98 mmol/L — ABNORMAL LOW (ref 101–111)
Creatinine, Ser: 9.55 mg/dL — ABNORMAL HIGH (ref 0.44–1.00)
GFR, EST AFRICAN AMERICAN: 5 mL/min — AB (ref >60)
GFR, EST NON AFRICAN AMERICAN: 4 mL/min — AB (ref >60)
Glucose, Bld: 189 mg/dL — ABNORMAL HIGH (ref 65–99)
PHOSPHORUS: 7.6 mg/dL — AB (ref 2.5–4.6)
Potassium: 4.7 mmol/L (ref 3.5–5.1)
SODIUM: 132 mmol/L — AB (ref 135–145)

## 2017-02-08 LAB — CBC
HCT: 30.8 % — ABNORMAL LOW (ref 36.0–46.0)
Hemoglobin: 9.7 g/dL — ABNORMAL LOW (ref 12.0–15.0)
MCH: 29.8 pg (ref 26.0–34.0)
MCHC: 31.5 g/dL (ref 30.0–36.0)
MCV: 94.8 fL (ref 78.0–100.0)
Platelets: 129 10*3/uL — ABNORMAL LOW (ref 150–400)
RBC: 3.25 MIL/uL — ABNORMAL LOW (ref 3.87–5.11)
RDW: 17 % — ABNORMAL HIGH (ref 11.5–15.5)
WBC: 4.8 10*3/uL (ref 4.0–10.5)

## 2017-02-08 LAB — ECHOCARDIOGRAM COMPLETE
Height: 66 in
Weight: 1925.94 oz

## 2017-02-08 LAB — GLUCOSE, CAPILLARY
GLUCOSE-CAPILLARY: 101 mg/dL — AB (ref 65–99)
GLUCOSE-CAPILLARY: 170 mg/dL — AB (ref 65–99)
GLUCOSE-CAPILLARY: 190 mg/dL — AB (ref 65–99)
GLUCOSE-CAPILLARY: 234 mg/dL — AB (ref 65–99)
Glucose-Capillary: 243 mg/dL — ABNORMAL HIGH (ref 65–99)

## 2017-02-08 LAB — URINE CULTURE: CULTURE: NO GROWTH

## 2017-02-08 LAB — HIV ANTIBODY (ROUTINE TESTING W REFLEX): HIV SCREEN 4TH GENERATION: NONREACTIVE

## 2017-02-08 MED ORDER — DOXERCALCIFEROL 4 MCG/2ML IV SOLN
INTRAVENOUS | Status: AC
  Administered 2017-02-08: 4 ug via INTRAVENOUS
  Filled 2017-02-08: qty 2

## 2017-02-08 MED ORDER — POLYETHYLENE GLYCOL 3350 17 G PO PACK
17.0000 g | PACK | Freq: Three times a day (TID) | ORAL | Status: AC
  Administered 2017-02-08 – 2017-02-09 (×3): 17 g via ORAL
  Filled 2017-02-08 (×3): qty 1

## 2017-02-08 NOTE — Progress Notes (Signed)
Patients foley is leaking. MD notified. MD order to remove foley and put a new one in. Orders followed. Will continue to monitor.

## 2017-02-08 NOTE — Progress Notes (Signed)
HD tx initiated via HD cath w/o problem, pull/push/flush equally well but per pt she runs reversed, VSS, will cont to monitor while on HD tx

## 2017-02-08 NOTE — Plan of Care (Signed)
Problem: Education: Goal: Knowledge of [place] [facility] Education information/materials will improve Outcome: Progressing POC reviewed with pt.   

## 2017-02-08 NOTE — Discharge Summary (Signed)
[facility] Discharge Summary  Patient name: Brandi Arellano Medical record number: [id number] Date of birth: 1955-12-10 Age: 61 y.o. Gender: female Date of Admission: 02/06/2017  Date of Discharge: 03/16/17  Admitting Physician: Leeanne Rio, MD  Primary Care Provider: Kerin Perna, NP Consultants: nephrology, vascular surgery, urology, psychiatry, cardiogy (cardiac clearance)  Indication for Hospitalization: Altered mental status Permcath trauma Need for dialysis  Discharge Diagnoses/Problem List:  Altered mental status ESRD Abdominal pain Nausea and vomiting Shortness of breath Bladder mass Potential suicide attempt Urinary retention History of UTI History of cva Type II diabetes Hyperlipidemia Hypertension Hypothyroidism Right aka Left transmetatarsal amp Diabetic gastroparesis Cardiac Ischemia Invasive Bladder Squamous Cell Carninoma  Disposition: SNF  Discharge Condition: Stable, improved  Discharge Exam:  General: NAD, well appearing CVS: RRR, II/VI murmur Lungs: CTAB, no increased work of breathing Abdomen: Soft, nontender, nondistended MSK: No lower extremity edema, Right aka  Brief Hospital Course:  61 year old female who presented on 10/28 with with altered mental status and tip of her R IJ permcath cut off. Patient was initially found by her brother unresponsive and with a blood soaked shirt. Permcath tip, scissors, and a large bottle of gabapentin was found under patient's bed. In the ed she was given narcan which caused her ams to immediately resolve.  Cardiac ischemia Due to immediate cardiac risk, patient received nuclear stress test as part of a pre-operative evaluation for upcoming cystoscopy for bladder mass as outpatient. It showed ST changes consistent with cardiac ischemia. Patient underwent cardiac catheterization the following day. It showed no coronary arteries amiable to PCI. Medical management  was continued for patient. Patient was cleared by cardiology to undergo urology procedures.   Altered mental status After receiving the narcan, patient became aox3 for the remainder of her hospitalization. She had a urine drug screen which was negative for all tested substances including opiates. This did not rule out a potential overdose on opioids however. Patient underwent dialysis after her permcath was exchanged on 10/29. After dialysis she endorsed being able to "think much better" and felt as though she was at her baseline. Patient came in with foul smelling urine and had a history of recurrent uti. UA with large amount of bacteria and LE. She was started on cefepim 10/28. Urine culture was negative for any growth at 48 hours. Cefepime was stopped 10/30. Unlikely infectious etiology played any role in her ams. Most likely due to opioid overdose with lack of dialysis perhaps playing a small role.  ESRD Patient found with severed permcath tip at admission. This was exchanged by vascular surgery on 10/29. She received diaylsis in the late pm of 10/29. She received an additional session 10/31, and scheduled HD throughout her stay. She was discharged to previous HD location where she will continue with her previous schedule of MWF. pateitn has transportation to and from HD.  Abdominal Pain Patient presented with RUQ and LLQ abdominal pain that she said started on 10/27, 2 days prior to admission. Abdominal CT did not reveal any obvious causes for the patient's pain although she was found to have a heavy colonic stool burden on abdominal ct. Also revealed a dilated common bile duct which is a normal finding in patient's s/p lap chole. She also had complaints consistent with neuropathic pain. Her abdominal pain resolved after dialysis on 10/29. Gabapentin was also started on this date. She was given miralax while inpatient to help with colonic stool burden.   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 Patient had Cystoscopy/TURBT on 03/01/17 found to have invasive squamous cell carcinoma of the bladder. Confirmed by Pathology. Patient has planned total GUectomy with bilateral nephrectomy, cystectomy, node dissection, hysterectomy, bilatteral oophorectomy. Tentatively in January. Patient has chronic indwelling catheter per her wishes. This has been draining hemorrhagic and cellular debris from the tumor. Patient can have catheter removed at an her choosing. Oncology was consulted and felt chemotherapy had little role, but palliative radiation was an option.   Potential suicide attempt There was concern by the patient's family that the severed permcath was a potential suicide attempt. Patient had a sitter in place and suicide precautions until 10/30. She was seen by psychiatry who did not feel as though the patient had suicidal ideation and recommended discontinuation of sitter and precautions. She is to follow up with psych as an outpatient. She did not endorse any further suicidal ideations during the rest of her stay. pateint reports feeling safe at her brother's.  Issues for Follow Up:  1. Follow up with urology for upcoming GUectomy. 2. Follow up with psychiatry.  3. Consider GI follow up to evaluate gastoparesis 4. Urology recommomendations - Please direct all non-urgent urological questions to Dr. Tresa Moore at [phone] 5. Oncology recommendations 6. Ensure patient feels safe in her home.  Significant Procedures: R IJ permcath exchange, TTE, Hemodialysis, Myoview, Left Cardiac Cath, Cystoscopy/TURBT   Significant Labs and Imaging:  Recent Labs  Lab 03/10/17 1104 03/11/17 1026 03/14/17 0730  WBC 8.3 8.0 6.1  HGB 9.5* 8.0* 8.2*  HCT 30.7* 25.8* 26.0*  PLT 200 174 158   Recent Labs  Lab 03/11/17 1026 03/14/17 0730  NA 134* 135  K 5.0 5.9*  CL 99* 97*  CO2 24 24  GLUCOSE 177* 160*  BUN 65* 85*  CREATININE  7.25* 8.39*  CALCIUM 8.9 9.0  PHOS  --  5.3*  ALBUMIN  --  2.9*    Results/Tests Pending at Time of Discharge:   Discharge Medications:  Allergies as of 03/15/2017      Reactions   Diphenhydramine Nausea And Vomiting   Tramadol Hcl Nausea And Vomiting   Morphine And Related Nausea And Vomiting      Medication List    STOP taking these medications   cefpodoxime 200 MG tablet Commonly known as:  VANTIN   insulin aspart 100 UNIT/ML injection Commonly known as:  novoLOG     TAKE these medications   amLODipine 5 MG tablet Commonly known as:  NORVASC Take 5 mg by mouth daily.   aspirin EC 81 MG tablet Take 81 mg by mouth daily.   atorvastatin 40 MG tablet Commonly known as:  LIPITOR Take 40 mg by mouth every evening.   lanthanum 1000 MG chewable tablet Commonly known as:  FOSRENOL Chew 1,000 mg by mouth See admin instructions. Pt to take 1 tablet with meals three times daily and 1 tablet with snacks   levothyroxine 25 MCG tablet Commonly known as:  SYNTHROID, LEVOTHROID Take 25 mcg by mouth daily before breakfast.   metoprolol tartrate 25 MG tablet Commonly known as:  LOPRESSOR Take 1 tablet (25 mg total) by mouth 2 (two) times daily.   senna-docusate 8.6-50 MG tablet Commonly known as:  Senokot-S Take 1 tablet by mouth at bedtime as needed for mild constipation.   thiamine 100 MG tablet Take 1 tablet (100 mg total) by mouth daily.  Discharge Instructions: Please refer to Patient Instructions section of EMR for full details.  Patient was counseled important signs and symptoms that should prompt return to medical care, changes in medications, dietary instructions, activity restrictions, and follow up appointments.   Follow-Up Appointments: Follow-up Information    Kerin Perna, NP Follow up.   Specialty:  Internal Medicine Why:  Please follow up in 1-2 weeks with Juluis Mire for hospital follow up Contact information: [place] [address] [address] Follow up.   Specialty:  Internal Medicine Contact information: [place] [address] [phone]       Irine Seal, MD Follow up.   Specialty:  Urology Why:  My office will call to arrange f/u. Contact information: [place]  [address] [phone]         Follow up with Dr. Jeffie Pollock in 1-2 weeks for cystoscopy  Kayleanna Lorman, [place], DO 03/16/2017, 8:43 PM PGY-1, [place]

## 2017-02-08 NOTE — Progress Notes (Signed)
  Echocardiogram 2D Echocardiogram has been performed.  Braylin Formby L Androw 02/08/2017, 11:02 AM

## 2017-02-08 NOTE — Progress Notes (Signed)
HD tx completed @ 0340 w/o problem, UF goal met, blood rinsed back, VSS, report called to Berniece Salines, RN

## 2017-02-08 NOTE — Progress Notes (Signed)
Family Medicine Teaching Service Daily Progress Note Intern Pager: [phone]  Patient name: Brandi Arellano Medical record number: [id number] Date of birth: Feb 26, 1956 Age: 61 y.o. Gender: female  Primary Care Provider: Kerin Perna, NP Consultants: nephrology, vascular surgery, urology, psychiatry Code Status: partial code (DNI, thinking about DNR)  [name]:  10/28 admitted to fpts 10/29 seen by vvs, permcath replaced, dialysis received, seen by urology 10/30 follow up completed psych eval  Assessment and Plan: Altered Mental Status Has resolved and patient is now aox4. Most likely etiology was overdose of narcotics given her immediate improvement with narcan. Patient had negative uds for opiates. Still does not exclude possible overdose on opioids given their synthetic nature. Patient is feeling much better today after dialysis so potentially some metabolic cause contributing some. Also treating uti with cefepime so perhaps infection played some role as well. - vital signs per floor routine - cardiac monitoring - daily bmp - tylenol for pain, fever - CIWA protocol - cefepime, pharmacy to dose - routine hiv screening  ESRD Patient had HD cath tip severed. Exchanged by Vascular surgery on 10/29. Received dialysis overnight. Appreciate nephrology recs, creatinine 9.5 10/30. k 4.7.  - follow up nephrology recommendations - dialysis per nephrology  Abdominal pain Resolved. Patient with large stool burden, started on miralax. Did not receive any due to procedures 10/29. Will continue. Resolved with gabapentin and dialysis. Possibly a combination of metabolites and neuropathic pain. Continue to follow bowel movements. - zofran for nausea - mirilax for constipation  Nausea and vomiting Resolved. Tolerating po. -zofran and reglan as above  Shortness of breath Resolved. Weaned down to room air while in room. No shortness of breath endorsed. Will  continue to follow. Likely volume overload from dialysis. - dialysis per nephro recs - wean o2 as tolerated  Bladder Mass Patient with a bladder mass in the left posterior base. Potentially the nidus for chronic uti. Large amount of hgb on last few UAs. Urology saw on 10/29. Will perform cystoscopy as outpt at [place] [facility] in 1-2 weeks.  Potential suicide attempt Patient's family concerned that his may have been a suicide attempt. Patient denies any thoughts or plans of self-harm or harming others. Regardless will have a sitter at bedside at least until story can be clarified. Will likely need psych consult during hospitalization. - suicide precautions - sitter at bedside - psych consult  Urinary Retention Patient with chronic indwelling foley 2/2 urinary retention. Has had recurrent UTI. S/p treatment with vantin. Patient with foul smelling urine and likely uti on ua. Will start on cefepime for pseudomonas coverage given her stated risk factors for very atypical uti. Continue foley for urinary retention. - cefepime, pharmacy to dose - await culture results and sensitivities  H/O UTI As above  History of CVA   Patient with right thalamic infarct appreciated on head ct. Has no residual deficits. Already on high intensity statin and aspirin as outpt. - continue atorvastatin, asa 81  Type II diabetes Patient on novolog 5U as needed for high sugars as outpatient. Will place on sSSI while inpatinet. Last a1c 7.4 on 05/07/15. Will check while inpatient. Can start basal dose if difficult to control with short acting. A1c 6.8 on 10/29. - sSSI - q 4 hour checks  Hyperlipidemia Patient currently on atorvastatin. No lipid panel in system. Continue atorvastatin. Will need lipid panel drawn as outpatient by pcp. - continue atorvastatin - lipid panel at pcp follow up  Hypertension Patient  with norvasc 5mg  daily at home. Current pressure 161/73. Hr 75. - continue home norvasc - prn  labetolol or hydralazine for systolic>170  Hypothyroidism Continue home synthroid 36mcg daily  Right Above Knee Amputation Patient with no skin breakdown at stump site. Has a nervous tic where she shakes this stump apparently due to phantom limb pain. Will monitor for any s/s developing ulcer. Will hold off on gabapentin for now given ams and potential od on gabapentin.  Left transmetatarsal amputation Stump site without any abnormalities. C/D/i with no skin breakdown.  Diabetic Gastroparesis Will likely need upper GI study as outpatient. Could potentially benefit from vagal nerve stimulator. If abdominal pain does not resolve can consider upper gi while here.  FEN/GI: renal/carb modified, miralax PPx: heparin  Disposition: home vs snf  Subjective:  Doing much better this morning. Says that she is   Objective: Temp:  [97.7 F (36.5 C)-98.6 F (37 C)] 98.5 F (36.9 C) (10/30 0424) Pulse Rate:  [66-88] 75 (10/30 0424) Resp:  [13-25] 21 (10/30 0424) BP: (99-187)/(56-99) 161/73 (10/30 0424) SpO2:  [92 %-100 %] 100 % (10/30 0424) Weight:  [120 lb 5.9 oz (54.6 kg)-140 lb (63.5 kg)] 120 lb 5.9 oz (54.6 kg) (10/30 0347) Physical Exam: General: alert, oriented x4. Resting comfortably, no acute distress Cardiovascular: tachycardic, 2/6 systolic ejection murmur. Palpable radial artery bilaterally. R IJ permcath in place. Respiratory: Lungs clear to ausculation, comfortably on room air Gastrointestinal: soft, non-distended. No tenderness No rebound tenderness. Multiple surgical scars noted. MSK: Patient with Right BKA. Able to move stump, sensation intact. Patient with Left transmetatarsal amputation. Decreased sensation from knee down. 5/5 strength all muscle groups. 5/5 strength BUE. Derm: warm and dry Neuro: AOx4, CN 2-12 intact. 5/5 strength BUE, LLE. Psych: denies any SI/HI. Appropriate.  Laboratory:  Recent Labs Lab 02/06/17 2000 02/06/17 2002 02/07/17 0431  02/08/17 0015  WBC 10.3  --  8.3 4.8  HGB 12.9 15.3* 12.4 9.7*  HCT 41.1 45.0 40.2 30.8*  PLT 163  --  154 129*    Recent Labs Lab 02/06/17 0200 02/06/17 2000 02/06/17 2002 02/07/17 0431 02/08/17 0015  NA 135 135 137 136 132*  K 4.4 4.4 4.3 4.8 4.7  CL 100* 96* 101 100* 98*  CO2 22 19*  --  17* 20*  BUN 53* 65* 56* 68* 81*  CREATININE 7.90* 8.81* 9.10* 8.90* 9.55*  CALCIUM 8.4* 9.2  --  8.4* 7.5*  PROT 6.6 8.2*  --   --   --   BILITOT 1.0 1.2  --   --   --   ALKPHOS 46 66  --   --   --   ALT 12* 17  --   --   --   AST 18 33  --   --   --   GLUCOSE 198* 236* 242* 237* 189*   Imaging/Diagnostic Tests: CLINICAL DATA:  Status post dialysis catheter placement  EXAM: PORTABLE CHEST 1 VIEW  COMPARISON:  02/06/2017  FINDINGS: Right-sided dialysis catheter is in place with tips in the right atrium. No pneumothorax identified. Mild cardiac enlargement. Bilateral pleural effusions and pulmonary edema are similar to previous exam  IMPRESSION: 1. No change in pulmonary edema and pleural effusions 2. No complications after right-sided dialysis catheter placement.  Guadalupe Dawn, MD 02/08/2017, 6:42 AM PGY-1, [place] Intern pager: [phone], text pages welcome

## 2017-02-08 NOTE — Progress Notes (Signed)
Patients foley is leaking. MD notified. Awaiting orders.Will continue to monitor.

## 2017-02-08 NOTE — Consult Note (Signed)
[place] Psychiatry Consult   Reason for Consult:  Questionable suicide behavior by cutting dialysis catheter Referring Physician:  Dr. [name] Patient Identification: Brandi Arellano MRN:  [id number] Principal Diagnosis: <principal problem not specified> Diagnosis:   Patient Active Problem List   Diagnosis Date Noted  . Altered mental status [R41.82] 02/07/2017  . Axillary mass, left [R22.32] 07/01/2016  . Dupuytren's contracture of left hand [M72.0] 05/07/2016  . HLD (hyperlipidemia) [E78.5] 08/04/2015  . Conjunctivitis [H10.9] 08/04/2015  . Insomnia [G47.00] 07/09/2015  . Gangrene associated with diabetes mellitus ([place]) [E11.52] 03/20/2014  . Gastroparesis due to DM ([place]) [E11.43, K31.84] 02/12/2014  . Status post above knee amputation, right ([place]) [Z89.611] 02/08/2014  . Buerger's disease ([place]) [I73.1] 02/08/2014  . Hyperparathyroidism, secondary renal ([place]) [N25.81] 02/06/2014  . Hyperphosphatemia [E83.39] 02/06/2014  . Anemia in chronic kidney disease [N18.9, D63.1] 02/06/2014  . 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 Essential hypertension [I10] 12/26/2013  . DM type 2, uncontrolled, with renal complications ([place]) [F68.12, E11.65] 12/26/2013  . CKD (chronic kidney disease) [N18.9] 12/26/2013  . Hypothyroidism [E03.9] 12/26/2013  . CAD (coronary artery disease) [I25.10] 12/26/2013  . End stage renal disease ([place]) [N18.6] 12/14/2013    Total Time spent with patient: 1 hour  Subjective:   Brandi Arellano is a 61 y.o. female patient admitted with AMS.  HPI:  Brandi Arellano a 61 y.o.femalewith ESRD on HD, Type 2, DM, HTN, CAD s/p CABG chronic dCHF, hx CVA, PVD s/p R AKA, L TMA, hypothyroidism, ESRD secondary to DM/HTN. HD initiated 10/2013.  Patient seen, chart reviewed for this face-to-face psychiatric consultation and evaluation of possible depression and suicidal behaviors.  Patient  he is awake, alert, oriented to time  place person.  Patient stated that she does not know how her renal catheter was broken and her guess is, it may be wrapped around her blanket and accidentally broke and she found herself bleeding and then came to the hospital.  Patient reported she has been compliant with all her dialysis since her brother told her she needed dialysis it is healthy for her.  Patient denied symptoms of depression, anxiety, auditory/visual hallucinations, delusions or paranoia.  Denied history of drinking of alcohol or smoking tobacco or marijuana.  Patient reportedly she has primary care physician has scheduled.  Past Psychiatric History: Denies history of depression and inpatient psychiatric hospitalization.  Risk to Self: Is patient at risk for suicide?: NO Risk to Others:   Prior Inpatient Therapy:   Prior Outpatient Therapy:    Past Medical History:  Past Medical History:  Diagnosis Date  . Anemia   . CAD (coronary artery disease)    CABG  . CHF (congestive heart failure) (HCC)    Acute on chronic diastolic  . Complication of anesthesia   . Diabetes mellitus without complication (HCC)    Type II  . DVT (deep venous thrombosis) ([place])   . ESRD (end stage renal disease) ([place])    HD M-W-F  . Gangrene of lower extremity (HCC)    RLE  . GERD (gastroesophageal reflux disease)   . Hx of AKA (above knee amputation), right ([place])   . Hyperlipidemia   . Hypertension   . Hypothyroidism   . PONV (postoperative nausea and vomiting)   . Stroke [facility])    "light stroke" no deficits  . Thrombocytopenia ([place])   . Thyroid disease    Abnormal Thyroid function Test  . UTI (lower urinary tract infection)  Past Surgical History:  Procedure Laterality Date  . ABDOMINAL ANGIOGRAM  12/26/2013   Procedure: ABDOMINAL ANGIOGRAM;  Surgeon: Serafina Clegg, MD;  Location: [facility] CATH LAB;  Service: Cardiovascular;;  . AMPUTATION Right 02/01/2014   Procedure: Right Below Knee Amputation;  Surgeon: Newt Minion, MD;   Location: [place];  Service: Orthopedics;  Laterality: Right;  . AMPUTATION Right 03/24/2014   Procedure: AMPUTATION ABOVE KNEE;  Surgeon: Newt Minion, MD;  Location: [place];  Service: Orthopedics;  Laterality: Right;  . ARCH AORTOGRAM  12/26/2013   Procedure: ARCH AORTOGRAM;  Surgeon: Serafina Wynns, MD;  Location: [facility] CATH LAB;  Service: Cardiovascular;;  . CAROTID ENDARTERECTOMY     Bilateral  . CHOLECYSTECTOMY    . CORONARY ARTERY BYPASS GRAFT    . FOOT AMPUTATION Bilateral   . HARDWARE REMOVAL Right 07/19/2014   Procedure: Removal Deep Hardware Right Femur;  Surgeon: Newt Minion, MD;  Location: [place];  Service: Orthopedics;  Laterality: Right;  Six screws and one condylar plate removed   . hemodialysis catheter    . INSERTION OF DIALYSIS CATHETER Right 02/07/2017   Procedure: INSERTION OF DIALYSIS CATHETER;  Surgeon: Rosetta Posner, MD;  Location: [place];  Service: Vascular;  Laterality: Right;  . LOWER EXTREMITY ANGIOGRAM N/A 12/26/2013   Procedure: LOWER EXTREMITY ANGIOGRAM;  Surgeon: Serafina Zinni, MD;  Location[facility] CATH LAB;  Service: Cardiovascular;  Laterality: N/A;  . UPPER EXTREMITY ANGIOGRAM Right 12/26/2013   Procedure: UPPER EXTREMITY ANGIOGRAM;  Surgeon: Serafina Davlin, MD;  Location: [facility] CATH LAB;  Service: Cardiovascular;  Laterality: Right;   Family History:  Family History  Problem Relation Age of Onset  . Adopted: Yes  . Hypertension Father   . Heart disease Father        Coronary Artery Disease   Family Psychiatric  History: Denied family history of mental illness. Social History:  History  Alcohol Use No     History  Drug Use No    Social History   Social History  . Marital status: Divorced    Spouse name: N/A  . Number of children: N/A  . Years of education: N/A   Social History Main Topics  . Smoking status: Former Smoker    Years: 30.00    Types: Cigarettes  . Smokeless tobacco: Never Used     Comment: quit in 2002  . Alcohol use No  . Drug  use: No  . Sexual activity: Not Asked   Other Topics Concern  . None   Social History Narrative  . None   Additional Social History:    Allergies:   Allergies  Allergen Reactions  . Diphenhydramine Nausea And Vomiting  . Tramadol Hcl Nausea And Vomiting  . Morphine And Related Nausea And Vomiting    Labs:  Results for orders placed or performed during the hospital encounter of 02/06/17 (from the past 48 hour(s))  CBC with Differential     Status: Abnormal   Collection Time: 02/06/17  8:00 PM  Result Value Ref Range   WBC 10.3 4.0 - 10.5 K/uL   RBC 4.27 3.87 - 5.11 MIL/uL   Hemoglobin 12.9 12.0 - 15.0 g/dL   HCT 41.1 36.0 - 46.0 %   MCV 96.3 78.0 - 100.0 fL   MCH 30.2 26.0 - 34.0 pg   MCHC 31.4 30.0 - 36.0 g/dL   RDW 16.3 (H) 11.5 - 15.5 %   Platelets 163 150 - 400 K/uL  Neutrophils Relative % 85 %   Neutro Abs 8.9 (H) 1.7 - 7.7 K/uL   Lymphocytes Relative 8 %   Lymphs Abs 0.8 0.7 - 4.0 K/uL   Monocytes Relative 5 %   Monocytes Absolute 0.5 0.1 - 1.0 K/uL   Eosinophils Relative 1 %   Eosinophils Absolute 0.1 0.0 - 0.7 K/uL   Basophils Relative 1 %   Basophils Absolute 0.1 0.0 - 0.1 K/uL  Comprehensive metabolic panel     Status: Abnormal   Collection Time: 02/06/17  8:00 PM  Result Value Ref Range   Sodium 135 135 - 145 mmol/L   Potassium 4.4 3.5 - 5.1 mmol/L   Chloride 96 (L) 101 - 111 mmol/L   CO2 19 (L) 22 - 32 mmol/L   Glucose, Bld 236 (H) 65 - 99 mg/dL   BUN 65 (H) 6 - 20 mg/dL   Creatinine, Ser 1.00 (H) 0.44 - 1.00 mg/dL   Calcium 9.2 8.9 - 42.9 mg/dL   Total Protein 8.2 (H) 6.5 - 8.1 g/dL   Albumin 3.7 3.5 - 5.0 g/dL   AST 33 15 - 41 U/L   ALT 17 14 - 54 U/L   Alkaline Phosphatase 66 38 - 126 U/L   Total Bilirubin 1.2 0.3 - 1.2 mg/dL   GFR calc non Af Amer 4 (L) >60 mL/min   GFR calc Af Amer 5 (L) >60 mL/min    Comment: (NOTE) The eGFR has been calculated using the CKD EPI equation. This calculation has not been validated in all clinical  situations. eGFR's persistently <60 mL/min signify possible Chronic Kidney Disease.    Anion gap 20 (H) 5 - 15  I-stat chem 8, ed     Status: Abnormal   Collection Time: 02/06/17  8:02 PM  Result Value Ref Range   Sodium 137 135 - 145 mmol/L   Potassium 4.3 3.5 - 5.1 mmol/L   Chloride 101 101 - 111 mmol/L   BUN 56 (H) 6 - 20 mg/dL   Creatinine, Ser 0.69 (H) 0.44 - 1.00 mg/dL   Glucose, Bld 913 (H) 65 - 99 mg/dL   Calcium, Ion 9.21 (L) 1.15 - 1.40 mmol/L   TCO2 22 22 - 32 mmol/L   Hemoglobin 15.3 (H) 12.0 - 15.0 g/dL   HCT 92.6 89.6 - 93.5 %  I-Stat CG4 Lactic Acid, ED     Status: None   Collection Time: 02/06/17  8:05 PM  Result Value Ref Range   Lactic Acid, Venous 1.44 0.5 - 1.9 mmol/L  Type and screen [facility]     Status: None (Preliminary result)   Collection Time: 02/06/17  8:05 PM  Result Value Ref Range   ABO/RH(D) O POS    Antibody Screen NEG    Sample Expiration 02/09/2017    PT AG Type NEGATIVE FOR E ANTIGEN NEGATIVE FOR KELL ANTIGEN    Unit Number [id number]    Blood Component Type RED CELLS,LR    Unit division 00    Status of Unit ALLOCATED    Transfusion Status OK TO TRANSFUSE    Crossmatch Result COMPATIBLE    Donor AG Type NEGATIVE FOR E ANTIGEN NEGATIVE FOR KELL ANTIGEN   Lipase, blood     Status: Abnormal   Collection Time: 02/06/17 11:35 PM  Result Value Ref Range   Lipase 66 (H) 11 - 51 U/L  Culture, Urine     Status: None   Collection Time: 02/07/17  1:38 AM  Result Value  Ref Range   Specimen Description URINE, RANDOM    Special Requests NONE    Culture NO GROWTH    Report Status 02/08/2017 FINAL   Urinalysis, Routine w reflex microscopic     Status: Abnormal   Collection Time: 02/07/17  1:50 AM  Result Value Ref Range   Color, Urine YELLOW YELLOW   APPearance TURBID (A) CLEAR   Specific Gravity, Urine 1.020 1.005 - 1.030   pH 8.0 5.0 - 8.0   Glucose, UA >=500 (A) NEGATIVE mg/dL   Hgb urine dipstick LARGE (A) NEGATIVE    Bilirubin Urine NEGATIVE NEGATIVE   Ketones, ur 5 (A) NEGATIVE mg/dL   Protein, ur >=300 (A) NEGATIVE mg/dL   Nitrite NEGATIVE NEGATIVE   Leukocytes, UA LARGE (A) NEGATIVE   RBC / HPF TOO NUMEROUS TO COUNT 0 - 5 RBC/hpf   WBC, UA TOO NUMEROUS TO COUNT 0 - 5 WBC/hpf   Bacteria, UA RARE (A) NONE SEEN   Squamous Epithelial / LPF 0-5 (A) NONE SEEN   Amorphous Crystal PRESENT   Urine rapid drug screen (hosp performed)     Status: None   Collection Time: 02/07/17  1:51 AM  Result Value Ref Range   Opiates NONE DETECTED NONE DETECTED   Cocaine NONE DETECTED NONE DETECTED   Benzodiazepines NONE DETECTED NONE DETECTED   Amphetamines NONE DETECTED NONE DETECTED   Tetrahydrocannabinol NONE DETECTED NONE DETECTED   Barbiturates NONE DETECTED NONE DETECTED    Comment:        DRUG SCREEN FOR MEDICAL PURPOSES ONLY.  IF CONFIRMATION IS NEEDED FOR ANY PURPOSE, NOTIFY LAB WITHIN 5 DAYS.        LOWEST DETECTABLE LIMITS FOR URINE DRUG SCREEN Drug Class       Cutoff (ng/mL) Amphetamine      1000 Barbiturate      200 Benzodiazepine   384 Tricyclics       665 Opiates          300 Cocaine          300 THC              50   MRSA PCR Screening     Status: None   Collection Time: 02/07/17  3:35 AM  Result Value Ref Range   MRSA by PCR NEGATIVE NEGATIVE    Comment:        The GeneXpert MRSA Assay (FDA approved for NASAL specimens only), is one component of a comprehensive MRSA colonization surveillance program. It is not intended to diagnose MRSA infection nor to guide or monitor treatment for MRSA infections.   Hemoglobin A1c     Status: Abnormal   Collection Time: 02/07/17  4:31 AM  Result Value Ref Range   Hgb A1c MFr Bld 6.8 (H) 4.8 - 5.6 %    Comment: (NOTE) Pre diabetes:          5.7%-6.4% Diabetes:              >6.4% Glycemic control for   <7.0% adults with diabetes    Mean Plasma Glucose 148.46 mg/dL  Basic metabolic panel     Status: Abnormal   Collection Time: 02/07/17   4:31 AM  Result Value Ref Range   Sodium 136 135 - 145 mmol/L   Potassium 4.8 3.5 - 5.1 mmol/L   Chloride 100 (L) 101 - 111 mmol/L   CO2 17 (L) 22 - 32 mmol/L   Glucose, Bld 237 (H) 65 - 99 mg/dL   BUN  68 (H) 6 - 20 mg/dL   Creatinine, Ser 8.90 (H) 0.44 - 1.00 mg/dL   Calcium 8.4 (L) 8.9 - 10.3 mg/dL   GFR calc non Af Amer 4 (L) >60 mL/min   GFR calc Af Amer 5 (L) >60 mL/min    Comment: (NOTE) The eGFR has been calculated using the CKD EPI equation. This calculation has not been validated in all clinical situations. eGFR's persistently <60 mL/min signify possible Chronic Kidney Disease.    Anion gap 19 (H) 5 - 15  CBC     Status: Abnormal   Collection Time: 02/07/17  4:31 AM  Result Value Ref Range   WBC 8.3 4.0 - 10.5 K/uL   RBC 4.19 3.87 - 5.11 MIL/uL   Hemoglobin 12.4 12.0 - 15.0 g/dL   HCT 40.2 36.0 - 46.0 %   MCV 95.9 78.0 - 100.0 fL   MCH 29.6 26.0 - 34.0 pg   MCHC 30.8 30.0 - 36.0 g/dL   RDW 16.7 (H) 11.5 - 15.5 %   Platelets 154 150 - 400 K/uL  Protime-INR     Status: None   Collection Time: 02/07/17  4:31 AM  Result Value Ref Range   Prothrombin Time 14.3 11.4 - 15.2 seconds   INR 1.12   Glucose, capillary     Status: Abnormal   Collection Time: 02/07/17  4:33 AM  Result Value Ref Range   Glucose-Capillary 240 (H) 65 - 99 mg/dL  Glucose, capillary     Status: Abnormal   Collection Time: 02/07/17  7:53 AM  Result Value Ref Range   Glucose-Capillary 119 (H) 65 - 99 mg/dL  HIV antibody (Routine Testing)     Status: None   Collection Time: 02/07/17 10:30 AM  Result Value Ref Range   HIV Screen 4th Generation wRfx Non Reactive Non Reactive    Comment: (NOTE) Performed At: [facility] [address] [place], [place] [id number] Lindon Romp MD QP:[id number]   Glucose, capillary     Status: Abnormal   Collection Time: 02/07/17 12:31 PM  Result Value Ref Range   Glucose-Capillary 148 (H) 65 - 99 mg/dL   Comment 1 Notify RN    Comment 2 Document  in Chart   Glucose, capillary     Status: Abnormal   Collection Time: 02/07/17  2:21 PM  Result Value Ref Range   Glucose-Capillary 147 (H) 65 - 99 mg/dL   Comment 1 Notify RN   Glucose, capillary     Status: Abnormal   Collection Time: 02/07/17  5:05 PM  Result Value Ref Range   Glucose-Capillary 165 (H) 65 - 99 mg/dL   Comment 1 Notify RN    Comment 2 Document in Chart   Glucose, capillary     Status: Abnormal   Collection Time: 02/07/17  8:35 PM  Result Value Ref Range   Glucose-Capillary 251 (H) 65 - 99 mg/dL  Glucose, capillary     Status: Abnormal   Collection Time: 02/07/17 11:36 PM  Result Value Ref Range   Glucose-Capillary 170 (H) 65 - 99 mg/dL  CBC     Status: Abnormal   Collection Time: 02/08/17 12:15 AM  Result Value Ref Range   WBC 4.8 4.0 - 10.5 K/uL   RBC 3.25 (L) 3.87 - 5.11 MIL/uL   Hemoglobin 9.7 (L) 12.0 - 15.0 g/dL    Comment: DELTA CHECK NOTED REPEATED TO VERIFY    HCT 30.8 (L) 36.0 - 46.0 %   MCV 94.8 78.0 -  100.0 fL   MCH 29.8 26.0 - 34.0 pg   MCHC 31.5 30.0 - 36.0 g/dL   RDW 17.0 (H) 11.5 - 15.5 %   Platelets 129 (L) 150 - 400 K/uL  Renal function panel     Status: Abnormal   Collection Time: 02/08/17 12:15 AM  Result Value Ref Range   Sodium 132 (L) 135 - 145 mmol/L   Potassium 4.7 3.5 - 5.1 mmol/L   Chloride 98 (L) 101 - 111 mmol/L   CO2 20 (L) 22 - 32 mmol/L   Glucose, Bld 189 (H) 65 - 99 mg/dL   BUN 81 (H) 6 - 20 mg/dL   Creatinine, Ser 9.55 (H) 0.44 - 1.00 mg/dL   Calcium 7.5 (L) 8.9 - 10.3 mg/dL   Phosphorus 7.6 (H) 2.5 - 4.6 mg/dL   Albumin 2.7 (L) 3.5 - 5.0 g/dL   GFR calc non Af Amer 4 (L) >60 mL/min   GFR calc Af Amer 5 (L) >60 mL/min    Comment: (NOTE) The eGFR has been calculated using the CKD EPI equation. This calculation has not been validated in all clinical situations. eGFR's persistently <60 mL/min signify possible Chronic Kidney Disease.    Anion gap 14 5 - 15  Glucose, capillary     Status: Abnormal   Collection  Time: 02/08/17  4:22 AM  Result Value Ref Range   Glucose-Capillary 101 (H) 65 - 99 mg/dL  Glucose, capillary     Status: Abnormal   Collection Time: 02/08/17  7:31 AM  Result Value Ref Range   Glucose-Capillary 170 (H) 65 - 99 mg/dL    Current Facility-Administered Medications  Medication Dose Route Frequency Provider Last Rate Last Dose  . 0.9 %  sodium chloride infusion  100 mL Intravenous PRN Ejigiri, Thomos Lemons, PA-C      . 0.9 %  sodium chloride infusion  100 mL Intravenous PRN Ejigiri, Thomos Lemons, PA-C      . acetaminophen (TYLENOL) tablet 650 mg  650 mg Oral Q6H PRN Sela Hilding, MD   650 mg at 02/07/17 4098   Or  . acetaminophen (TYLENOL) suppository 650 mg  650 mg Rectal Q6H PRN Sela Hilding, MD      . alteplase (CATHFLO ACTIVASE) injection 2 mg  2 mg Intracatheter Once PRN Lynnda Child, PA-C      . amLODipine (NORVASC) tablet 5 mg  5 mg Oral Daily Sela Hilding, MD   5 mg at 02/08/17 1004  . aspirin EC tablet 81 mg  81 mg Oral BID Sela Hilding, MD   81 mg at 02/08/17 1004  . atorvastatin (LIPITOR) tablet 40 mg  40 mg Oral QPM Sela Hilding, MD   40 mg at 02/07/17 1815  . ceFEPIme (MAXIPIME) 2 g in dextrose 5 % 50 mL IVPB  2 g Intravenous Q M,W,F-2000 Erenest Blank, RPH   Stopped at 02/07/17 2132  . chlorhexidine (PERIDEX) 0.12 % solution 15 mL  15 mL Mouth Rinse BID Leeanne Rio, MD   15 mL at 02/08/17 1004  . docusate sodium (COLACE) capsule 100 mg  100 mg Oral Daily Guadalupe Dawn, MD   100 mg at 02/08/17 1005  . doxercalciferol (HECTOROL) injection 4 mcg  4 mcg Intravenous Q M,W,F-HD Lynnda Child, PA-C   4 mcg at 02/08/17 0105  . folic acid (FOLVITE) tablet 1 mg  1 mg Oral Daily Sela Hilding, MD   1 mg at 02/08/17 1004  . gabapentin (NEURONTIN) capsule 100 mg  100 mg Oral BID Wendee Beavers T, MD   100 mg at 02/08/17 1005  . heparin injection 1,000 Units  1,000 Units Dialysis PRN Lynnda Child, PA-C    1,000 Units at 02/08/17 0345  . heparin injection 1,300 Units  20 Units/kg Dialysis PRN Lynnda Child, PA-C   1,300 Units at 02/08/17 6644  . heparin injection 5,000 Units  5,000 Units Subcutaneous Q8H Gabriel Earing, PA-C   5,000 Units at 02/08/17 0347  . insulin aspart (novoLOG) injection 0-9 Units  0-9 Units Subcutaneous Q4H Guadalupe Dawn, MD   2 Units at 02/08/17 1004  . lanthanum (FOSRENOL) chewable tablet 1,000 mg  1,000 mg Oral TID WC Lynnda Child, PA-C   1,000 mg at 02/08/17 1005  . levothyroxine (SYNTHROID, LEVOTHROID) tablet 25 mcg  25 mcg Oral QAC breakfast Sela Hilding, MD   25 mcg at 02/08/17 1005  . lidocaine (PF) (XYLOCAINE) 1 % injection 5 mL  5 mL Intradermal PRN Lynnda Child, PA-C      . lidocaine-prilocaine (EMLA) cream 1 application  1 application Topical PRN Lynnda Child, PA-C      . LORazepam (ATIVAN) tablet 1 mg  1 mg Oral Q6H PRN Sela Hilding, MD   1 mg at 02/07/17 0809   Or  . LORazepam (ATIVAN) injection 1 mg  1 mg Intravenous Q6H PRN Sela Hilding, MD      . MEDLINE mouth rinse  15 mL Mouth Rinse q12n4p Leeanne Rio, MD      . multivitamin with minerals tablet 1 tablet  1 tablet Oral Daily Sela Hilding, MD   1 tablet at 02/08/17 1004  . pentafluoroprop-tetrafluoroeth (GEBAUERS) aerosol 1 application  1 application Topical PRN Ejigiri, Thomos Lemons, PA-C      . polyethylene glycol (MIRALAX / GLYCOLAX) packet 17 g  17 g Oral BID Wendee Beavers T, MD   17 g at 02/08/17 1004  . sodium chloride flush (NS) 0.9 % injection 3 mL  3 mL Intravenous Q12H Sela Hilding, MD   3 mL at 02/07/17 0811  . thiamine (VITAMIN B-1) tablet 100 mg  100 mg Oral Daily Sela Hilding, MD   100 mg at 02/08/17 1005   Facility-Administered Medications Ordered in Other Encounters  Medication Dose Route Frequency Provider Last Rate Last Dose  . 0.9 %  sodium chloride infusion    Continuous PRN Laretta Alstrom, CRNA         Musculoskeletal: Strength & Muscle Tone: within normal limits Gait & Station: normal Patient leans: N/A  Psychiatric Specialty Exam: Physical Exam as per history and physical  ROS denied nausea, vomiting, abdominal pain, shortness of breath.  Patient also denied chest pain, depression and anxiety. No Fever-chills, No Headache, No changes with Vision or hearing, reports vertigo No problems swallowing food or Liquids, No Chest pain, Cough or Shortness of Breath, No Abdominal pain, No Nausea or Vommitting, Bowel movements are regular, No Blood in stool or Urine, No dysuria, No new skin rashes or bruises, No new joints pains-aches,  No new weakness, tingling, numbness in any extremity, No recent weight gain or loss, No polyuria, polydypsia or polyphagia,  A full 10 point Review of Systems was done, except as stated above, all other Review of Systems were negative.  Blood pressure (!) 161/73, pulse 75, temperature 98.5 F (36.9 C), temperature source Oral, resp. rate (!) 21, height '5\' 6"'$  (1.676 m), weight 54.6 kg (120 lb 5.9 oz), SpO2 100 %.Body mass index  is 19.43 kg/m.  General Appearance: Casual  Eye Contact:  Good  Speech:  Clear and Coherent  Volume:  Normal  Mood:  Euthymic  Affect:  Appropriate and Congruent  Thought Process:  Coherent and Goal Directed  Orientation:  Full (Time, Place, and Person)  Thought Content:  WDL  Suicidal Thoughts:  No  Homicidal Thoughts:  No  Memory:  Immediate;   Good Recent;   Fair Remote;   Fair  Judgement:  Fair  Insight:  Good  Psychomotor Activity:  Decreased  Concentration:  Concentration: Good and Attention Span: Good  Recall:  Good  Fund of Knowledge:  Good  Language:  Good  Akathisia:  Negative  Handed:  Right  AIMS (if indicated):     Assets:  Communication Skills Desire for Improvement Financial Resources/Insurance Housing Leisure Time Resilience Social Support Talents/Skills Transportation  ADL's:  Intact   Cognition:  WNL  Sleep:        Treatment Plan Summary: 61 years old female with multiple medical problems including end-stage renal disease admitted with altered mental status.  Patient has been slowly improved her mental status now back to her normal.  Patient denies symptoms of depression, anxiety or suicidal ideation, intention or plans.  Patient has no evidence of psychosis.  Patient has no history of mental illness.  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 Patient has been cooperative with the staff members regarding her hemodialysis as recommended Recommended no psychotropic medications at this time Psychiatry consultation cleared patient for outpatient care when medically stable. Appreciate psychiatric consultation and we sign off as of today Please contact [phone] or [phone] if needs further assistance   Disposition: Patient does not meet criteria for psychiatric inpatient admission. Supportive therapy provided about ongoing stressors.  Ambrose [place], MD 02/08/2017 11:26 AM

## 2017-02-08 NOTE — Progress Notes (Signed)
[facility] Progress Note  Subjective: looks much better today, more alert and not sluggish like yest.  Had new R IJ cath placed yest and HD with 3L off, no BP drops.   Vitals:   02/08/17 0340 02/08/17 0347 02/08/17 0424 02/08/17 1100  BP: (!) 187/99 (!) 173/88 (!) 161/73 136/65  Pulse: 80 84 75 75  Resp: 16 (!) 25 (!) 21 20  Temp:  98 F (36.7 C) 98.5 F (36.9 C) 98.6 F (37 C)  TempSrc:  Oral Oral Oral  SpO2: 98% 97% 100% 98%  Weight:  54.6 kg (120 lb 5.9 oz)    Height:        Inpatient medications: . amLODipine  5 mg Oral Daily  . aspirin EC  81 mg Oral BID  . atorvastatin  40 mg Oral QPM  . chlorhexidine  15 mL Mouth Rinse BID  . docusate sodium  100 mg Oral Daily  . doxercalciferol  4 mcg Intravenous Q M,W,F-HD  . folic acid  1 mg Oral Daily  . gabapentin  100 mg Oral BID  . heparin  5,000 Units Subcutaneous Q8H  . insulin aspart  0-9 Units Subcutaneous Q4H  . lanthanum  1,000 mg Oral TID WC  . levothyroxine  25 mcg Oral QAC breakfast  . mouth rinse  15 mL Mouth Rinse q12n4p  . multivitamin with minerals  1 tablet Oral Daily  . polyethylene glycol  17 g Oral BID  . sodium chloride flush  3 mL Intravenous Q12H  . thiamine  100 mg Oral Daily   . sodium chloride    . sodium chloride     sodium chloride, sodium chloride, acetaminophen **OR** acetaminophen, alteplase, heparin, heparin, lidocaine (PF), lidocaine-prilocaine, LORazepam **OR** LORazepam, pentafluoroprop-tetrafluoroeth  Exam: General: WDWN Female NAD using nasal O2 Head: NCAT sclera not icteric MMM Neck: Supple. No JVD CEA scars bilaterally Lungs: Breathing unlabored CTAB  Heart: RRR with S1 S2 3/6 systolic EM Abdomen: soft NT + BS Lower extremities: R AKA, L TMA no LE edema  Neuro: alert, Ox 3 today, much better  Psych:  Flat affect, drowsy  Dialysis Access: R IJ TDC clean exit (new cath replaced the old cracked one )  Dialysis: [facility] MWF 4h   56.5kg  2/2.25  Hep 4000   R IJ TDC  (new one placed yest 10/29) -hect 4 ug tiw -BMM: Fosrenol 1000mg  2 tabs tid     Impression: 1. AMS - resolved, likely narcotics, per primary team 2. Hx UTI/ urinary retention - per primary, foley in place 3. ESRD - MWF HD.  Had broken TDC replaced w/ new cath yest 10/29. Pt is not candidate for perm access due to severe PAD 4. HTN/ vol - cont norvasc. Is below prior dry wt today, lower dry wt at dc.  5. 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 DM per primary 8. Bladder mass - seen on abd CT, urol consulted 9. SI - psych consult  Plan - HD [facility] pager [phone]   02/08/2017, 12:57 PM    Recent Labs Lab 02/06/17 2000 02/06/17 2002 02/07/17 0431 02/08/17 0015  NA 135 137 136 132*  K 4.4 4.3 4.8 4.7  CL 96* 101 100* 98*  CO2 19*  --  17* 20*  GLUCOSE 236* 242* 237* 189*  BUN 65* 56* 68* 81*  CREATININE 8.81* 9.10* 8.90*  9.55*  CALCIUM 9.2  --  8.4* 7.5*  PHOS  --   --   --  7.6*    Recent Labs Lab 02/06/17 0200 02/06/17 2000 02/08/17 0015  AST 18 33  --   ALT 12* 17  --   ALKPHOS 46 66  --   BILITOT 1.0 1.2  --   PROT 6.6 8.2*  --   ALBUMIN 3.0* 3.7 2.7*    Recent Labs Lab 02/06/17 2000 02/06/17 2002 02/07/17 0431 02/08/17 0015  WBC 10.3  --  8.3 4.8  NEUTROABS 8.9*  --   --   --   HGB 12.9 15.3* 12.4 9.7*  HCT 41.1 45.0 40.2 30.8*  MCV 96.3  --  95.9 94.8  PLT 163  --  154 129*   Iron/TIBC/Ferritin/ %Sat    Component Value Date/Time   IRON 54 01/29/2014 0720   TIBC 210 (L) 01/29/2014 0720   FERRITIN 726 (H) 02/09/2014 0719   IRONPCTSAT 26 01/29/2014 0720

## 2017-02-09 ENCOUNTER — Encounter (HOSPITAL_COMMUNITY): Payer: Self-pay | Admitting: Physician Assistant

## 2017-02-09 DIAGNOSIS — Z01818 Encounter for other preprocedural examination: Secondary | ICD-10-CM | POA: Insufficient documentation

## 2017-02-09 DIAGNOSIS — I25119 Atherosclerotic heart disease of native coronary artery with unspecified angina pectoris: Secondary | ICD-10-CM

## 2017-02-09 DIAGNOSIS — N3289 Other specified disorders of bladder: Secondary | ICD-10-CM | POA: Insufficient documentation

## 2017-02-09 DIAGNOSIS — Z01810 Encounter for preprocedural cardiovascular examination: Secondary | ICD-10-CM

## 2017-02-09 DIAGNOSIS — Z7189 Other specified counseling: Secondary | ICD-10-CM

## 2017-02-09 HISTORY — DX: Other specified disorders of bladder: N32.89

## 2017-02-09 LAB — BPAM RBC
BLOOD PRODUCT EXPIRATION DATE: 201811052359
Unit Type and Rh: 5100

## 2017-02-09 LAB — CBC
HEMATOCRIT: 34.7 % — AB (ref 36.0–46.0)
HEMOGLOBIN: 10.7 g/dL — AB (ref 12.0–15.0)
MCH: 29.6 pg (ref 26.0–34.0)
MCHC: 30.8 g/dL (ref 30.0–36.0)
MCV: 96.1 fL (ref 78.0–100.0)
Platelets: 148 10*3/uL — ABNORMAL LOW (ref 150–400)
RBC: 3.61 MIL/uL — ABNORMAL LOW (ref 3.87–5.11)
RDW: 16.7 % — ABNORMAL HIGH (ref 11.5–15.5)
WBC: 6.6 10*3/uL (ref 4.0–10.5)

## 2017-02-09 LAB — TYPE AND SCREEN
ABO/RH(D): O POS
ANTIBODY SCREEN: NEGATIVE
Donor AG Type: NEGATIVE
PT AG TYPE: NEGATIVE
Unit division: 0

## 2017-02-09 LAB — BASIC METABOLIC PANEL
ANION GAP: 12 (ref 5–15)
BUN: 42 mg/dL — ABNORMAL HIGH (ref 6–20)
CO2: 23 mmol/L (ref 22–32)
Calcium: 8.3 mg/dL — ABNORMAL LOW (ref 8.9–10.3)
Chloride: 98 mmol/L — ABNORMAL LOW (ref 101–111)
Creatinine, Ser: 6.52 mg/dL — ABNORMAL HIGH (ref 0.44–1.00)
GFR calc Af Amer: 7 mL/min — ABNORMAL LOW (ref >60)
GFR, EST NON AFRICAN AMERICAN: 6 mL/min — AB (ref >60)
GLUCOSE: 176 mg/dL — AB (ref 65–99)
POTASSIUM: 4.1 mmol/L (ref 3.5–5.1)
Sodium: 133 mmol/L — ABNORMAL LOW (ref 135–145)

## 2017-02-09 LAB — GLUCOSE, CAPILLARY
GLUCOSE-CAPILLARY: 165 mg/dL — AB (ref 65–99)
GLUCOSE-CAPILLARY: 176 mg/dL — AB (ref 65–99)
Glucose-Capillary: 224 mg/dL — ABNORMAL HIGH (ref 65–99)
Glucose-Capillary: 72 mg/dL (ref 65–99)
Glucose-Capillary: 83 mg/dL (ref 65–99)

## 2017-02-09 MED ORDER — HEPARIN SODIUM (PORCINE) 1000 UNIT/ML DIALYSIS: 1000.0000 [IU] | INTRAMUSCULAR | Status: DC | PRN

## 2017-02-09 MED ORDER — SODIUM CHLORIDE 0.9 % IV SOLN: 100.0000 mL | INTRAVENOUS | Status: DC | PRN

## 2017-02-09 MED ORDER — PENTAFLUOROPROP-TETRAFLUOROETH EX AERO: 1.0000 "application " | INHALATION_SPRAY | CUTANEOUS | Status: DC | PRN

## 2017-02-09 MED ORDER — ALTEPLASE 2 MG IJ SOLR: 2.0000 mg | Freq: Once | INTRAMUSCULAR | Status: DC | PRN

## 2017-02-09 MED ORDER — LIDOCAINE HCL (PF) 1 % IJ SOLN: 5.0000 mL | INTRAMUSCULAR | Status: DC | PRN

## 2017-02-09 MED ORDER — HEPARIN SODIUM (PORCINE) 1000 UNIT/ML DIALYSIS: 4000.0000 [IU] | Freq: Once | INTRAMUSCULAR | Status: DC

## 2017-02-09 MED ORDER — LIDOCAINE-PRILOCAINE 2.5-2.5 % EX CREA: 1.0000 "application " | TOPICAL_CREAM | CUTANEOUS | Status: DC | PRN

## 2017-02-09 MED ORDER — DOXERCALCIFEROL 4 MCG/2ML IV SOLN
INTRAVENOUS | Status: AC
  Filled 2017-02-09: qty 2

## 2017-02-09 NOTE — Progress Notes (Signed)
OT Cancellation Note  Patient Details Name: Brandi Arellano MRN: [id number] DOB: [birth year]   Cancelled Treatment:    Reason Eval/Treat Not Completed: Patient at procedure or test/ unavailable. Pt off the floor at HD. OT will continue to follow for evaluation as schedule allows.   [place] 02/09/2017, 8:21 AM  Hulda Humphrey OTR/L [phone]
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
## 2017-02-09 NOTE — Progress Notes (Signed)
I asked the patient if she felt safe going home with her brother. Patient said "ask him". She expressed to me that her brother [name] said he does not want her going home with him. Patient told me she was going to go live with her daughter. When her brother came in today, I was giving patient her medications and she looked at her brother then looked at me and started to cry. I spoke to MD of what the patients son stated per yesterdays conversation and note posted, as well as what occurred today. Social work and chaplain have been consulted spoken with the patient as well.

## 2017-02-09 NOTE — Progress Notes (Signed)
[facility] Progress Note  Subjective: doing well, no new c/o's.  On HD now.    Vitals:   02/09/17 0900 02/09/17 0930 02/09/17 1000 02/09/17 1030  BP: (!) 164/79 (!) 160/81 (!) 153/79 (!) 154/82  Pulse: 62 65 67 68  Resp:      Temp:      TempSrc:      SpO2:      Weight:      Height:        Inpatient medications: . amLODipine  5 mg Oral Daily  . aspirin EC  81 mg Oral BID  . atorvastatin  40 mg Oral QPM  . chlorhexidine  15 mL Mouth Rinse BID  . docusate sodium  100 mg Oral Daily  . doxercalciferol  4 mcg Intravenous Q M,W,F-HD  . folic acid  1 mg Oral Daily  . gabapentin  100 mg Oral BID  . [START ON 02/10/2017] heparin  4,000 Units Dialysis Once in dialysis  . heparin  5,000 Units Subcutaneous Q8H  . insulin aspart  0-9 Units Subcutaneous Q4H  . lanthanum  1,000 mg Oral TID WC  . levothyroxine  25 mcg Oral QAC breakfast  . mouth rinse  15 mL Mouth Rinse q12n4p  . multivitamin with minerals  1 tablet Oral Daily  . polyethylene glycol  17 g Oral TID  . sodium chloride flush  3 mL Intravenous Q12H  . thiamine  100 mg Oral Daily   . sodium chloride    . sodium chloride    . sodium chloride    . sodium chloride     sodium chloride, sodium chloride, sodium chloride, sodium chloride, acetaminophen **OR** acetaminophen, alteplase, heparin, lidocaine (PF), lidocaine-prilocaine, LORazepam **OR** LORazepam, pentafluoroprop-tetrafluoroeth  Exam: General: WDWN Female NAD using nasal O2 Head: NCAT sclera not icteric MMM Neck: Supple. No JVD CEA scars bilaterally Lungs: Breathing unlabored CTAB  Heart: RRR with S1 S2 3/6 systolic EM Abdomen: soft NT + BS Lower extremities: R AKA, L TMA no LE edema  Neuro: alert, Ox 3 today, much better  Psych:  Flat affect, drowsy  Dialysis Access: R IJ TDC clean exit (new cath replaced the old cracked one )  Dialysis: [facility] MWF 4h   56.5kg  2/2.25  Hep 4000   R IJ TDC (new one placed yest 10/29) -hect 4 ug tiw -BMM:  Fosrenol 1000mg  2 tabs tid     Impression: 1. AMS - resolved, likely narcotics, per primary team 2. Hx UTI/ urinary retention - per primary, foley in place 3. ESRD - MWF HD.  Had broken TDC replaced w/ new cath 10/29. Pt is not candidate for perm access due to severe PAD.  4. HTN/ vol - cont norvasc. Is close to dry  5. 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 DM per primary  8. Bladder mass - urology saw the pt 9. SI - psych consult determined no SI and no need antidepressants, sitter dc'd 10. Dispo - per primary  Plan - HD today, UF as Ronnie Derby MD [employer] pager [phone]   02/09/2017, 10:40 AM    Recent Labs Lab 02/07/17 0431 02/08/17 0015 02/09/17 0302  NA 136 132* 133*  K 4.8 4.7 4.1  CL 100* 98* 98*  CO2 17* 20* 23  GLUCOSE 237* 189* 176*  BUN 68* 81* 42*  CREATININE 8.90* 9.55* 6.52*  CALCIUM 8.4* 7.5* 8.3*  PHOS  --  7.6*  --     Recent Labs Lab 02/06/17 0200 02/06/17 2000 02/08/17 0015  AST 18 33  --   ALT 12* 17  --   ALKPHOS 46 66  --   BILITOT 1.0 1.2  --   PROT 6.6 8.2*  --   ALBUMIN 3.0* 3.7 2.7*    Recent Labs Lab 02/06/17 2000  02/07/17 0431 02/08/17 0015 02/09/17 0302  WBC 10.3  --  8.3 4.8 6.6  NEUTROABS 8.9*  --   --   --   --   HGB 12.9  < > 12.4 9.7* 10.7*  HCT 41.1  < > 40.2 30.8* 34.7*  MCV 96.3  --  95.9 94.8 96.1  PLT 163  --  154 129* 148*  < > = values in this interval not displayed. Iron/TIBC/Ferritin/ %Sat    Component Value Date/Time   IRON 54 01/29/2014 0720   TIBC 210 (L) 01/29/2014 0720   FERRITIN 726 (H) 02/09/2014 0719   IRONPCTSAT 26 01/29/2014 0720

## 2017-02-09 NOTE — Progress Notes (Signed)
Family Medicine Teaching Service Daily Progress Note Intern Pager: [phone]  Patient name: Brandi Arellano Medical record number: [id number] Date of birth: Aug 10, 1955 Age: 61 y.o. Gender: female  Primary Care Provider: Kerin Perna, NP Consultants: nephrology, vascular surgery, urology, psychiatry Code Status: partial code (DNI, thinking about DNR)  [name]:  10/28 admitted to fpts  10/29 seen by vvs, permcath replaced, dialysis received, seen by urology 10/30 evaluated by psych 10/31 dialysis, possible dc  Assessment and Plan: Altered Mental Status Has resolved and patient is now aox4. Most likely etiology was overdose of narcotics given her immediate improvement with narcan. Patient had negative uds for opiates. Still does not exclude possible overdose on opioids given their synthetic nature. Patient also felt much better after dialysis 10/29 so metabolites/electrolyte abnormality maybe playing some role. Stopped cefepime 10/31, foul smelling urine and numerous bacteria on UA likely 2/2 to bladder mass. - vital signs per floor routine - cardiac monitoring - daily bmp - tylenol for pain, fever - CIWA protocol  ESRD Patient had HD cath tip severed. Exchanged by Vascular surgery on 10/29. Received dialysis overnight. Appreciate nephrology recs, creatinine 9.5 10/30. k 4.7. Likely dialysis 10/31. - follow up nephrology recommendations - dialysis per nephrology  Abdominal pain Resolved. Patient with large stool burden, started on miralax. Did not receive any due to procedures 10/29. Received one dose 10/30 and had one bowel movement overnight. Was discontinued. Most likely due to neuropathic pain in legs after improvement on gabapentin. - zofran for nausea - gabapentin for neuropathic pain  Nausea and vomiting Resolved. Tolerating po. -zofran and reglan as above  Shortness of breath Resolved. Weaned down to room air while in room. No  shortness of breath endorsed. Will continue to follow. Likely volume overload from dialysis. - dialysis per nephro recs - wean o2 as tolerated  Bladder Mass Patient with a bladder mass in the left posterior base. Potentially the nidus for chronic uti. Large amount of hgb on last few UAs. Urology saw on 10/29. Will perform cystoscopy as outpt at [facility] in 1-2 weeks.  Potential suicide attempt Patient's family concerned that his may have been a suicide attempt. Patient denies any thoughts or plans of self-harm or harming others. Evaluated by psych who felt that patient was not at risk for suicide. Recommended d/c sitter. Will follow up outpatient with psych. 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 Has had recurrent UTI. S/p treatment with vantin. Patient with foul smelling urine. No growth on UA, so cefepime stopped. Continue foley until outpatient follow for procedure with urology. - d/c cefepime  H/O UTI As above  History of CVA   Patient with right thalamic infarct appreciated on head ct. Has no residual deficits. Already on high intensity statin and aspirin as outpt. - continue atorvastatin, asa 81  Type II diabetes Patient on novolog 5U as needed for high sugars as outpatient. Will place on sSSI while inpatinet. Last a1c 7.4 on 05/07/15. Will check while inpatient. Can start basal dose if difficult to control with short acting. A1c 6.8 on 10/29. - sSSI - q 4 hour checks  Hyperlipidemia Patient currently on atorvastatin. No lipid panel in system. Continue atorvastatin. Will need lipid panel drawn as outpatient by pcp. - continue atorvastatin - lipid panel at pcp follow up  Hypertension Patient with norvasc 5mg  daily at home. Current pressure 133/63. Hr 74. - continue home norvasc -  prn labetolol or hydralazine for systolic>170  Hypothyroidism Continue home synthroid 74mcg daily  Right  Above Knee Amputation Patient with no skin breakdown at stump site. Has a nervous tic where she shakes this stump apparently due to phantom limb pain. Will monitor for any s/s developing ulcer. Will hold off on gabapentin for now given ams and potential od on gabapentin.  Left transmetatarsal amputation Stump site without any abnormalities. C/D/i with no skin breakdown.  Diabetic Gastroparesis Will likely need upper GI study as outpatient. Could potentially benefit from vagal nerve stimulator. If abdominal pain does not resolve can consider upper gi while here.  Dispo: Patient is pending eval by pt/ot. Will possibly need snf placement given bilateral amputations.  FEN/GI: renal/carb modified, miralax PPx: heparin  Disposition: home vs snf  Subjective: Sleeping at dialysis during exam. Easily arousal. No complaints. Doing much better.   Objective: Temp:  [98.2 F (36.8 C)-98.6 F (37 C)] 98.3 F (36.8 C) (10/31 0506) Pulse Rate:  [70-79] 74 (10/31 0506) Resp:  [15-20] 15 (10/31 0506) BP: (123-136)/(63-82) 133/63 (10/31 0506) SpO2:  [92 %-98 %] 92 % (10/31 0506) Weight:  [121 lb 8 oz (55.1 kg)] 121 lb 8 oz (55.1 kg) (10/30 2006) Physical Exam: General: alert, oriented x4. Resting comfortably, receiving dialysis. no acute distress Cardiovascular: tachycardic, 2/6 systolic ejection murmur. Palpable radial artery bilaterally. R IJ permcath in place. Respiratory: Lungs clear to ausculation, comfortably on room air Gastrointestinal: soft, non-distended. No tenderness No rebound tenderness. Multiple surgical scars noted. MSK: Patient with Right BKA. Able to move stump, sensation intact. Patient with Left transmetatarsal amputation. Decreased sensation from knee down. 5/5 strength all muscle groups. 5/5 strength BUE. Derm: warm and dry Neuro: AOx4, CN 2-12 intact. 5/5 strength BUE, LLE. Psych: denies any SI/HI. Appropriate.  Laboratory:  Recent Labs Lab 02/07/17 0431  02/08/17 0015 02/09/17 0302  WBC 8.3 4.8 6.6  HGB 12.4 9.7* 10.7*  HCT 40.2 30.8* 34.7*  PLT 154 129* 148*    Recent Labs Lab 02/06/17 0200 02/06/17 2000  02/07/17 0431 02/08/17 0015 02/09/17 0302  NA 135 135  < > 136 132* 133*  K 4.4 4.4  < > 4.8 4.7 4.1  CL 100* 96*  < > 100* 98* 98*  CO2 22 19*  --  17* 20* 23  BUN 53* 65*  < > 68* 81* 42*  CREATININE 7.90* 8.81*  < > 8.90* 9.55* 6.52*  CALCIUM 8.4* 9.2  --  8.4* 7.5* 8.3*  PROT 6.6 8.2*  --   --   --   --   BILITOT 1.0 1.2  --   --   --   --   ALKPHOS 46 66  --   --   --   --   ALT 12* 17  --   --   --   --   AST 18 33  --   --   --   --   GLUCOSE 198* 236*  < > 237* 189* 176*  < > = values in this interval not displayed. Imaging/Diagnostic Tests: CLINICAL DATA:  Status post dialysis catheter placement  EXAM: PORTABLE CHEST 1 VIEW  COMPARISON:  02/06/2017  FINDINGS: Right-sided dialysis catheter is in place with tips in the right atrium. No pneumothorax identified. Mild cardiac enlargement. Bilateral pleural effusions and pulmonary edema are similar to previous exam  IMPRESSION: 1. No change in pulmonary edema and pleural effusions 2. No complications after right-sided dialysis catheter placement.  Guadalupe Dawn, MD  02/09/2017, 6:13 AM PGY-1, [place] Intern pager: [phone], text pages welcome

## 2017-02-09 NOTE — Progress Notes (Signed)
PT Cancellation and Discharge Note  Patient Details Name: Sondi Desch MRN: [id number] DOB: 10-27-1955   Cancelled Treatment:    Reason Eval/Treat Not Completed: Other (comment)   Received PT discontinue order; Will sign off, and be happy to reconsult for PT with new orders;   Thank you,  Roney Marion, PT  Acute Rehabilitation Services Pager [phone] Office [phone]    Colletta [place] 02/09/2017, 3:54 PM

## 2017-02-09 NOTE — Progress Notes (Signed)
PT Cancellation Note  Patient Details Name: Amina Menchaca MRN: [id number] DOB: [birth year]   Cancelled Treatment:    Reason Eval/Treat Not Completed: Patient at procedure or test/unavailable   Currently in HD;  Will follow up later today as time allows;  Otherwise, will follow up for PT tomorrow;   Thank you,  Roney Marion, PT  Acute Rehabilitation Services Pager [phone] Office [phone]     Colletta [place] 02/09/2017, 8:50 AM

## 2017-02-09 NOTE — Consult Note (Signed)
Cardiology Consultation:   Patient ID: Corleen Otwell; [id number]; 1955/06/04   Admit date: 02/06/2017 Date of Consult: 02/09/2017  Primary Care Provider: Kerin Perna, NP Primary Cardiologist: New Primary Electrophysiologist:  n/a   Patient Profile:   Micca Matura is a 61 y.o. female with a hx of DM, HTN, HLD, ESRD on HD, anemia, DVT/CVA, R axillary>brachial bypass graft with propatent external ring graft, CABG, PVD s/p R AKA, L TMA, who is being seen today for preop evaluation at the request of Dr [name].  History of Present Illness:   Ms. Wrench was admitted 10/29 w/ possible overdose, AMS, found with scissors in the bed and bleeding from a cut HD catheter.   She required VVS consult and insertion of new R IJ tunneled catheter by Dr Donnetta Hutching.  Seen by psychiatry and not suicidal.  Nephrology has been following and managing HD.   Bladder mass seen on CT, concerning for neoplasm. Urology saw and she needs cystoscopy with EUA and TURBT with bilateral retrogrades. This will be arranged for a week or 2 from now.   Cardiology was asked to see preop.   Ms Kensinger had CABG in 2012, no stress test or cath since. She moves herself around in the house and goes to HD. Denies CP or SOB with these activities.   However, when she is under emotional stress (thinks she overslept for HD), she will get upper L chest pain>>L arm to her elbow. She will calm herself down and the sx resolve. No other chest pain.  Tries to maintain her volume between HD appts, denies DOE, orthopnea or PND.   Multiple areas of pain, currently dealing with abdominal pain, phantom limb pain, and others.    Past Medical History:  Diagnosis Date  . Anemia   . Bladder mass 02/09/2017  . CAD (coronary artery disease)    CABG  . CHF (congestive heart failure) (HCC)    Acute on chronic diastolic  . Complication of anesthesia   . Diabetes mellitus without complication (HCC)    Type II  . DVT (deep  venous thrombosis) ([place])   . ESRD (end stage renal disease) ([place])    HD M-W-F  . Gangrene of lower extremity (HCC)    RLE  . GERD (gastroesophageal reflux disease)   . Hx of AKA (above knee amputation), right ([place])   . Hyperlipidemia   . Hypertension   . Hypothyroidism   . PONV (postoperative nausea and vomiting)   . Stroke [facility])    "light stroke" no deficits  . Thrombocytopenia ([place])   . Thyroid disease    Abnormal Thyroid function Test  . UTI (lower urinary tract infection)     Past Surgical History:  Procedure Laterality Date  . ABDOMINAL ANGIOGRAM  12/26/2013   Procedure: ABDOMINAL ANGIOGRAM;  Surgeon: Serafina Heal, MD;  Location: [facility] CATH LAB;  Service: Cardiovascular;;  . AMPUTATION Right 02/01/2014   Procedure: Right Below Knee Amputation;  Surgeon: Newt Minion, MD;  Location: [place];  Service: Orthopedics;  Laterality: Right;  . AMPUTATION Right 03/24/2014   Procedure: AMPUTATION ABOVE KNEE;  Surgeon: Newt Minion, MD;  Location: [place];  Service: Orthopedics;  Laterality: Right;  . ARCH AORTOGRAM  12/26/2013   Procedure: ARCH AORTOGRAM;  Surgeon: Serafina Graffam, MD;  Location: [facility] CATH LAB;  Service: Cardiovascular;;  . AXILLARY ARTERY - BRACHIAL ARTERY BYPASS GRAFT  11/07/2013   At [facility]  . CAROTID ENDARTERECTOMY     Bilateral  .  CHOLECYSTECTOMY    . CORONARY ARTERY BYPASS GRAFT  2012   in [place], [place]  . FOOT AMPUTATION Bilateral   . HARDWARE REMOVAL Right 07/19/2014   Procedure: Removal Deep Hardware Right Femur;  Surgeon: Newt Minion, MD;  Location: [place];  Service: Orthopedics;  Laterality: Right;  Six screws and one condylar plate removed   . hemodialysis catheter    . INSERTION OF DIALYSIS CATHETER Right 02/07/2017   Procedure: INSERTION OF DIALYSIS CATHETER;  Surgeon: Rosetta Posner, MD;  Location: [place];  Service: Vascular;  Laterality: Right;  . LOWER EXTREMITY ANGIOGRAM N/A 12/26/2013   Procedure: LOWER EXTREMITY ANGIOGRAM;  Surgeon: Serafina Londo, MD;  Location: [facility] CATH LAB;  Service: Cardiovascular;  Laterality: N/A;  . UPPER EXTREMITY ANGIOGRAM Right 12/26/2013   Procedure: UPPER EXTREMITY ANGIOGRAM;  Surgeon: Serafina Spoon, MD;  Location: [facility] CATH LAB;  Service: Cardiovascular;  Laterality: Right;     Inpatient Medications: Scheduled Meds: . amLODipine  5 mg Oral Daily  . aspirin EC  81 mg Oral BID  . atorvastatin  40 mg Oral QPM  . chlorhexidine  15 mL Mouth Rinse BID  . docusate sodium  100 mg Oral Daily  . doxercalciferol  4 mcg Intravenous Q M,W,F-HD  . folic acid  1 mg Oral Daily  . gabapentin  100 mg Oral BID  . heparin  5,000 Units Subcutaneous Q8H  . insulin aspart  0-9 Units Subcutaneous Q4H  . lanthanum  1,000 mg Oral TID WC  . levothyroxine  25 mcg Oral QAC breakfast  . mouth rinse  15 mL Mouth Rinse q12n4p  . multivitamin with minerals  1 tablet Oral Daily  . sodium chloride flush  3 mL Intravenous Q12H  . thiamine  100 mg Oral Daily   Continuous Infusions: . sodium chloride    . sodium chloride     PRN Meds: sodium chloride, sodium chloride, acetaminophen **OR** acetaminophen, LORazepam **OR** LORazepam Prior to Admission medications   Medication Sig Start Date End Date Taking? Authorizing Provider  amLODipine (NORVASC) 5 MG tablet Take 5 mg by mouth daily.   Yes [provider]  aspirin EC 81 MG tablet Take 81 mg by mouth daily.    Yes [provider]  atorvastatin (LIPITOR) 40 MG tablet Take 40 mg by mouth every evening.    Yes [provider]  cefpodoxime (VANTIN) 200 MG tablet Take 1 tab PO after dialysis for 4 doses Patient taking differently: Take 200 mg by mouth every 12 (twelve) hours. after dialysis 01/01/17  Yes Kirichenko, Tatyana, PA-C  insulin aspart (NOVOLOG) 100 UNIT/ML injection Inject 5 Units into the skin daily as needed for high blood sugar. Check BS twice daily : If >150 inject 5 units    Yes [provider]  lanthanum (FOSRENOL) 1000 MG  chewable tablet Chew 1,000 mg by mouth See admin instructions. Pt to take 1 tablet with meals three times daily and 1 tablet with snacks   Yes [provider]  levothyroxine (SYNTHROID, LEVOTHROID) 25 MCG tablet Take 25 mcg by mouth daily before breakfast.   Yes [provider]    Allergies:    Allergies  Allergen Reactions  . Diphenhydramine Nausea And Vomiting  . Tramadol Hcl Nausea And Vomiting  . Morphine And Related Nausea And Vomiting    Social History:   Social History   Social History  . Marital status: Divorced    Spouse name: N/A  . Number of children:  N/A  . Years of education: N/A   Occupational History  . Not on file.   Social History Main Topics  . Smoking status: Former Smoker    Years: 30.00    Types: Cigarettes  . Smokeless tobacco: Never Used     Comment: quit in 2002  . Alcohol use No  . Drug use: No  . Sexual activity: Not on file   Other Topics Concern  . Not on file   Social History Narrative   Pt currently living with her brother.   Independent w/ transfers bed>wheelchair    Family History:   The patient's family history includes Heart disease in her father; Hypertension in her father. She was adopted. Pt is adopted.    ROS:  Please see the history of present illness.  All other ROS reviewed and negative.      Physical Exam/Data:   Vitals:   02/09/17 0930 02/09/17 1000 02/09/17 1030 02/09/17 1057  BP: (!) 160/81 (!) 153/79 (!) 154/82 (!) 158/71  Pulse: 65 67 68 71  Resp:    13  Temp:    97.9 F (36.6 C)  TempSrc:    Oral  SpO2:    92%  Weight:    117 lb 11.6 oz (53.4 kg)  Height:        Intake/Output Summary (Last 24 hours) at 02/09/17 1245 Last data filed at 02/09/17 1057  Gross per 24 hour  Intake              765 ml  Output             2700 ml  Net            -1935 ml   Filed Weights   02/08/17 2006 02/09/17 0720 02/09/17 1057  Weight: 121 lb 8 oz (55.1 kg) 123 lb 14.4 oz (56.2 kg) 117 lb 11.6 oz  (53.4 kg)   Body mass index is 19 kg/m.  General:  Well nourished, well developed, in no acute distress HEENT: normal Lymph: no adenopathy Neck: no JVD Endocrine:  No thryomegaly Vascular: bilateral carotid bruits vs rad SEM; Bilateral femoral bruits, weak L PT pulse, 2 + bilateral radial pulses  Cardiac:  normal S1, S2; RRR; 2-3/6 murmur  Lungs:  clear to auscultation bilaterally, no wheezing, rhonchi or rales  Abd: soft, nontender, no hepatomegaly  Ext: no edema Musculoskeletal:  No new deformities, BUE and BLE strength normal and equal; s/p R AKA and L TMA Skin: warm and dry  Neuro:  CNs 2-12 intact, no focal abnormalities noted Psych:  Normal affect   EKG:  The EKG was personally reviewed and demonstrates:  10/28, ST, HR 100, LVH, no sig change from 11/2016 Telemetry:  Telemetry was personally reviewed and demonstrates:  SR, ST  Relevant CV Studies:  ECHO: 02/08/2017 - Left ventricle: The cavity size was normal. Wall thickness was   normal. Systolic function was mildly reduced. The estimated   ejection fraction was in the range of 45% to 50%. Diffuse   hypokinesis. Features are consistent with a pseudonormal left   ventricular filling pattern, with concomitant abnormal relaxation   and increased filling pressure (grade 2 diastolic dysfunction). - Aortic valve: There was moderate stenosis. Valve area (VTI): 1.55   cm^2. Valve area (Vmax): 1.65 cm^2. Valve area (Vmean): 1.46   cm^2. - Mitral valve: Calcified annulus. - Left atrium: The atrium was mildly dilated. - Line: A venous catheter was visualized in the superior vena cava,  with its tip in the right atrium. No abnormal features noted. - Line: A venous catheter was visualized in the superior vena cava,   with its tip in the right atrium. No abnormal features noted.  Laboratory Data:  Chemistry  Recent Labs Lab 02/07/17 0431 02/08/17 0015 02/09/17 0302  NA 136 132* 133*  K 4.8 4.7 4.1  CL 100* 98* 98*  CO2  17* 20* 23  GLUCOSE 237* 189* 176*  BUN 68* 81* 42*  CREATININE 8.90* 9.55* 6.52*  CALCIUM 8.4* 7.5* 8.3*  GFRNONAA 4* 4* 6*  GFRAA 5* 5* 7*  ANIONGAP 19* 14 12     Recent Labs Lab 02/06/17 0200 02/06/17 2000 02/08/17 0015  PROT 6.6 8.2*  --   ALBUMIN 3.0* 3.7 2.7*  AST 18 33  --   ALT 12* 17  --   ALKPHOS 46 66  --   BILITOT 1.0 1.2  --    Hematology  Recent Labs Lab 02/07/17 0431 02/08/17 0015 02/09/17 0302  WBC 8.3 4.8 6.6  RBC 4.19 3.25* 3.61*  HGB 12.4 9.7* 10.7*  HCT 40.2 30.8* 34.7*  MCV 95.9 94.8 96.1  MCH 29.6 29.8 29.6  MCHC 30.8 31.5 30.8  RDW 16.7* 17.0* 16.7*  PLT 154 129* 148*    Radiology/Studies:  Ct Head Wo Contrast  Result Date: 02/06/2017 CLINICAL DATA:  Altered level of consciousness. History of stroke, carotid endarterectomy, hypertension, hyperlipidemia, diabetes. EXAM: CT HEAD WITHOUT CONTRAST TECHNIQUE: Contiguous axial images were obtained from the base of the skull through the vertex without intravenous contrast. COMPARISON:  CT HEAD November 11, 2016 FINDINGS: BRAIN: No intraparenchymal hemorrhage, mass effect nor midline shift. Patchy supratentorial white matter hypodensities. Old RIGHT putaminal and LEFT caudate and RIGHT thalamus lacunar infarcts. Mild ex vacuo dilatation LEFT frontal horn of lateral ventricle. No hydrocephalus. No acute large vascular territory infarcts. No abnormal extra-axial fluid collections. Basal cisterns are patent. VASCULAR: Severe calcific atherosclerosis of the carotid siphons and intradural vertebral artery's. SKULL: No skull fracture. No significant scalp soft tissue swelling. SINUSES/ORBITS: The mastoid air-cells and included paranasal sinuses are well-aerated. Soft tissue within LEFT external auditory canal compatible with cerumen. The included ocular globes and orbital contents are non-suspicious. Status post LEFT ocular lens implant. OTHER: None. IMPRESSION: 1. No acute intracranial process. 2. Mild chronic small  vessel ischemic disease. Old basal ganglia and RIGHT thalamus lacunar infarcts. 3. Severe atherosclerosis. Electronically Signed   By: Elon Alas M.D.   On: 02/06/2017 22:31   Ct Abdomen Pelvis W Contrast  Result Date: 02/06/2017 CLINICAL DATA:  61 year old female with abdominal pain. History of UTI and diabetes. Patient is on dialysis. EXAM: CT ABDOMEN AND PELVIS WITH CONTRAST TECHNIQUE: Multidetector CT imaging of the abdomen and pelvis was performed using the standard protocol following bolus administration of intravenous contrast. CONTRAST:  180mL ISOVUE-300 IOPAMIDOL (ISOVUE-300) INJECTION 61% COMPARISON:  Abdominal CT dated 01/01/2017 FINDINGS: Lower chest: Partially visualized moderate bilateral pleural effusions, new compared to the CT of 01/01/2017. There is diffuse interstitial prominence consistent with pulmonary edema. Bibasilar airspace densities may represent atelectatic changes versus infiltrate. Multi vessel coronary vascular calcification noted. There is no intra-abdominal free air. There is a small free fluid within the pelvis. Hepatobiliary: The liver is unremarkable. There is mild dilatation of the intrahepatic biliary tree as well as common bile duct which may be related to post cholecystectomy changes. However if there has been interval increase in the dilatation of the intrahepatic biliary tree which may represent some periportal  edema. The common bile duct measures up to 13 mm in diameter. No retained calcified stone noted in the central CBD. Pancreas: Unremarkable. No pancreatic ductal dilatation or surrounding inflammatory changes. Spleen: Normal in size without focal abnormality. Adrenals/Urinary Tract: The adrenal glands are unremarkable. There is moderate bilateral renal parenchyma atrophy. Bilateral renovascular calcification noted. There is no hydronephrosis on either side. There is diffusely thickened and trabecular appearance of the bladder wall. There is a 3 x 2 x 3 cm  heterogeneously enhancing mass arising from the posterior base of the bladder and to the right of the midline. There is associated mass effect and displacement of the balloon of the Foley catheter to the left. Findings most consistent with a neoplastic process possibly a transitional cell carcinoma. Further evaluation with cystoscopy is recommended. Diffuse thickening of the bladder wall with perivesical stranding may be related to infiltrative tumor or represent acute cystitis. Correlation with urinalysis recommended. Air within the urinary bladder, likely introduced via Foley catheter. Stomach/Bowel: Evaluation of the bowel is limited in the absence of oral contrast. Diffuse thickened appearance of the colon likely related to underdistention. Correlation with clinical exam is recommended to exclude colitis. There is no bowel obstruction. The appendix is normal. Vascular/Lymphatic: There is advanced aortoiliac atherosclerotic disease. There is a 2.4 cm infrarenal aortic ectasia. The IVC is unremarkable. No portal venous gas identified. No adenopathy. Reproductive: The uterus is retroflexed and grossly unremarkable. The ovaries are poorly visualized. Other: Mild diffuse subcutaneous edema and anasarca. Musculoskeletal: No acute osseous pathology. IMPRESSION: 1. Polypoid mass arising from the posterior base of the bladder highly concerning for a neoplastic process. Diffuse thickened appearance of the bladder wall may be related to cystitis or infiltration of tumor. Correlation with urinalysis recommended. Further evaluation with cystoscopy, following resolution of acute inflammation, recommended. 2. Diffusely thickened: Likely related to underdistention. Clinical correlation is recommended to exclude colitis. No bowel obstruction. Normal appendix. 3. Partially visualized moderate bilateral pleural effusions, pulmonary edema, small ascites, and anasarca. 4.  Aortic Atherosclerosis (ICD10-I70.0). 5. A 2.4 cm infrarenal  aortic ectasia. Electronically Signed   By: Anner Crete M.D.   On: 02/06/2017 23:57   US Abdomen Limited  Result Date: 02/07/2017 CLINICAL DATA:  Right upper quadrant pain for 2 days EXAM: ULTRASOUND ABDOMEN LIMITED RIGHT UPPER QUADRANT COMPARISON:  CT 02/06/2017 FINDINGS: Gallbladder: Prior cholecystectomy Common bile duct: Diameter: Mildly prominent, 9 mm, likely related to post cholecystectomy state. Liver: No focal lesion identified. Within normal limits in parenchymal echogenicity. Portal vein is patent on color Doppler imaging with normal direction of blood flow towards the liver. Incidentally noted is increased echotexture throughout the right kidney suggesting chronic medical renal disease. Right pleural effusion noted. IMPRESSION: Mild prominence of the common bile duct, likely related to post cholecystectomy state. Small right pleural effusion. Increased echotexture throughout the right kidney compatible with chronic medical renal disease. Electronically Signed   By: Rolm Baptise M.D.   On: 02/07/2017 10:30   Dg Chest Port 1 View  Result Date: 02/07/2017 CLINICAL DATA:  Status post dialysis catheter placement EXAM: PORTABLE CHEST 1 VIEW COMPARISON:  02/06/2017 FINDINGS: Right-sided dialysis catheter is in place with tips in the right atrium. No pneumothorax identified. Mild cardiac enlargement. Bilateral pleural effusions and pulmonary edema are similar to previous exam IMPRESSION: 1. No change in pulmonary edema and pleural effusions 2. No complications after right-sided dialysis catheter placement. Electronically Signed   By: Kerby Moors M.D.   On: 02/07/2017 15:43   Dg Chest  Portable 1 View  Result Date: 02/06/2017 CLINICAL DATA:  Sever Port-A-Cath EXAM: PORTABLE CHEST 1 VIEW COMPARISON:  February 06, 2017 FINDINGS: The patient's double-lumen PermCath appears to be clamped with a hemostat. No pneumothorax. Worsening pulmonary edema. Cardiomegaly. No other interval changes.  IMPRESSION: 1. Worsening pulmonary edema.  PermCath as above. Electronically Signed   By: Dorise Bullion III M.D   On: 02/06/2017 20:58   Dg Abd Acute W/chest  Result Date: 02/06/2017 CLINICAL DATA:  Abdominal pain for 2 hours. Nausea and diarrhea. History of end-stage renal disease on dialysis. EXAM: DG ABDOMEN ACUTE W/ 1V CHEST COMPARISON:  Chest radiograph November 11, 2016 an CT abdomen and pelvis November 11, 2016 FINDINGS: Cardiac silhouette is mildly enlarged and unchanged. Calcified aortic knob. Status post median sternotomy for CABG. Vascular stent projects at aortic arch. Diffuse interstitial prominence slightly worse than prior radiograph without pleural effusion or focal consolidation. Apical pleural thickening. No pneumothorax. Surgical clips in the neck consistent with history of carotid endarterectomy. Vascular clips LEFT arm. Tunneled dialysis catheter via RIGHT internal jugular venous approach with distal tip projecting cavoatrial junction. Bowel gas pattern is nondilated and nonobstructive. Overall paucity of bowel gas. Extensive vascular calcifications is seen on prior CT. Surgical clips in the included right abdomen compatible with cholecystectomy. No intra-abdominal mass effect. No intraperitoneal free air. Soft tissue planes included osseous structures are nonacute. IMPRESSION: Stable cardiomegaly.  Worsening interstitial edema. Nonspecific bowel gas pattern. Electronically Signed   By: Elon Alas M.D.   On: 02/06/2017 04:33   Dg Fluoro Guide Cv Line-no Report  Result Date: 02/07/2017 Fluoroscopy was utilized by the requesting physician.  No radiographic interpretation.    Assessment and Plan:   1.   Preoperative evaluation to rule out surgical contraindication - Pt w/ hx CAD but no recent ischemic eval. - no clearly ischemic sx but she is at high risk to have progression of dz and exertion level is not high. - discuss w/ MD if MV is needed and timing.  Principal  Problem: 2.  Altered mental status - improved, pt wonders if she can go home today  Active Problems: 3.  ESRD on hemodialysis ([place]) - had HD this am, tolerated well  4.  Bladder mass - for procedure in 1-2 weeks.   Jonetta Speak, PA-C  02/09/2017 12:45 PM

## 2017-02-09 NOTE — Progress Notes (Signed)
Late entry (02/08/17).   Patients son Wilber Oliphant called expressing concerns for his mother. He doesn't feel the she was responsible for causing trauma to her HD catheter. Her son feels the patients brother whom she lives with has something to do with her HD catheter being damaged.

## 2017-02-10 ENCOUNTER — Inpatient Hospital Stay (HOSPITAL_COMMUNITY): Payer: Medicaid Other

## 2017-02-10 DIAGNOSIS — R079 Chest pain, unspecified: Secondary | ICD-10-CM

## 2017-02-10 LAB — RENAL FUNCTION PANEL
ANION GAP: 9 (ref 5–15)
Albumin: 2.7 g/dL — ABNORMAL LOW (ref 3.5–5.0)
BUN: 24 mg/dL — ABNORMAL HIGH (ref 6–20)
CALCIUM: 8.5 mg/dL — AB (ref 8.9–10.3)
CHLORIDE: 96 mmol/L — AB (ref 101–111)
CO2: 27 mmol/L (ref 22–32)
Creatinine, Ser: 4.8 mg/dL — ABNORMAL HIGH (ref 0.44–1.00)
GFR calc Af Amer: 11 mL/min — ABNORMAL LOW (ref >60)
GFR calc non Af Amer: 9 mL/min — ABNORMAL LOW (ref >60)
GLUCOSE: 141 mg/dL — AB (ref 65–99)
POTASSIUM: 4.2 mmol/L (ref 3.5–5.1)
Phosphorus: 3 mg/dL (ref 2.5–4.6)
SODIUM: 132 mmol/L — AB (ref 135–145)

## 2017-02-10 LAB — NM MYOCAR MULTI W/SPECT W/WALL MOTION / EF
CHL CUP MPHR: 161 {beats}/min
CSEPPHR: 85 {beats}/min
Percent HR: 52 %
Rest HR: 72 {beats}/min

## 2017-02-10 LAB — GLUCOSE, CAPILLARY
GLUCOSE-CAPILLARY: 122 mg/dL — AB (ref 65–99)
GLUCOSE-CAPILLARY: 176 mg/dL — AB (ref 65–99)
Glucose-Capillary: 161 mg/dL — ABNORMAL HIGH (ref 65–99)
Glucose-Capillary: 193 mg/dL — ABNORMAL HIGH (ref 65–99)
Glucose-Capillary: 203 mg/dL — ABNORMAL HIGH (ref 65–99)
Glucose-Capillary: 224 mg/dL — ABNORMAL HIGH (ref 65–99)

## 2017-02-10 MED ORDER — SODIUM CHLORIDE 0.9 % IV SOLN: 250.0000 mL | INTRAVENOUS | Status: DC | PRN

## 2017-02-10 MED ORDER — ASPIRIN 81 MG PO CHEW
81.0000 mg | CHEWABLE_TABLET | ORAL | Status: AC
  Administered 2017-02-11: 81 mg via ORAL
  Filled 2017-02-10: qty 1

## 2017-02-10 MED ORDER — TECHNETIUM TC 99M TETROFOSMIN IV KIT
10.0000 | PACK | Freq: Once | INTRAVENOUS | Status: AC | PRN
  Administered 2017-02-10: 10 via INTRAVENOUS

## 2017-02-10 MED ORDER — REGADENOSON 0.4 MG/5ML IV SOLN
0.4000 mg | Freq: Once | INTRAVENOUS | Status: AC
  Administered 2017-02-10: 0.4 mg via INTRAVENOUS

## 2017-02-10 MED ORDER — REGADENOSON 0.4 MG/5ML IV SOLN
INTRAVENOUS | Status: AC
  Filled 2017-02-10: qty 5

## 2017-02-10 MED ORDER — SODIUM CHLORIDE 0.9 % IV SOLN: INTRAVENOUS | Status: DC

## 2017-02-10 MED ORDER — SODIUM CHLORIDE 0.9% FLUSH
3.0000 mL | Freq: Two times a day (BID) | INTRAVENOUS | Status: DC
  Administered 2017-02-10 – 2017-02-11 (×2): 3 mL via INTRAVENOUS

## 2017-02-10 MED ORDER — SODIUM CHLORIDE 0.9% FLUSH: 3.0000 mL | INTRAVENOUS | Status: DC | PRN

## 2017-02-10 MED ORDER — TECHNETIUM TC 99M TETROFOSMIN IV KIT
30.0000 | PACK | Freq: Once | INTRAVENOUS | Status: AC | PRN
  Administered 2017-02-10: 30 via INTRAVENOUS
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
## 2017-02-10 NOTE — Progress Notes (Signed)
[facility] Progress Note  Subjective: had stress test this am, needs cardiac clearance in case urology needs to do procedure.   Vitals:   02/10/17 0419 02/10/17 1001 02/10/17 1018 02/10/17 1021  BP: 131/77 115/83 138/89 (!) 240/50  Pulse: 69 72 82 82  Resp: 14     Temp: 98.2 F (36.8 C)     TempSrc: Oral     SpO2: 93%     Weight:      Height:        Inpatient medications: . amLODipine  5 mg Oral Daily  . aspirin EC  81 mg Oral BID  . atorvastatin  40 mg Oral QPM  . chlorhexidine  15 mL Mouth Rinse BID  . docusate sodium  100 mg Oral Daily  . doxercalciferol  4 mcg Intravenous Q M,W,F-HD  . folic acid  1 mg Oral Daily  . gabapentin  100 mg Oral BID  . heparin  5,000 Units Subcutaneous Q8H  . insulin aspart  0-9 Units Subcutaneous Q4H  . lanthanum  1,000 mg Oral TID WC  . levothyroxine  25 mcg Oral QAC breakfast  . mouth rinse  15 mL Mouth Rinse q12n4p  . multivitamin with minerals  1 tablet Oral Daily  . regadenoson      . sodium chloride flush  3 mL Intravenous Q12H  . thiamine  100 mg Oral Daily   . sodium chloride    . sodium chloride     sodium chloride, sodium chloride, acetaminophen **OR** acetaminophen  Exam: General: WDWN Female NAD using nasal O2 Head: NCAT sclera not icteric MMM Neck: Supple. No JVD CEA scars bilaterally Lungs: Breathing unlabored CTAB  Heart: RRR with S1 S2 3/6 systolic EM Abdomen: soft NT + BS Lower extremities: R AKA, L TMA no LE edema  Neuro: alert, Ox 3 today, much better  Psych:  Flat affect, drowsy  Dialysis Access: R IJ TDC clean exit (new cath just replaced here)  Dialysis: [facility] MWF 4h   56.5kg  2/2.25  Hep 4000   R IJ TDC (new one placed yest 10/29) -hect 4 ug tiw -BMM: Fosrenol 1000mg  2 tabs tid     Impression: 1. UTI/ urinary retention - per primary/ urology;  foley in place 2. Broken TDC - replaced on 10/29, new cath working well.  3. AMS - due to pain meds, resolved 4. ESRD - MWF HD. Pt is  cath-dependent, not a candidate for perm access due to severe PAD per VVS.  5. HTN/ vol - cont norvasc, 3kg under dry wt 6. Anemia of CKD - Hb up , no need esa for now 7. 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 DM per primary  9. Bladder mass - urology evaluating 10. SI - psych said no SI and no need medications  Plan - HD Friday, min UF   [name] MD [facility] pager [phone]   02/10/2017, 11:44 AM    Recent Labs Lab 02/08/17 0015 02/09/17 0302 02/10/17 0730  NA 132* 133* 132*  K 4.7 4.1 4.2  CL 98* 98* 96*  CO2 20* 23 27  GLUCOSE 189* 176* 141*  BUN 81* 42* 24*  CREATININE 9.55* 6.52* 4.80*  CALCIUM 7.5* 8.3* 8.5*  PHOS 7.6*  --  3.0    Recent Labs Lab 02/06/17 0200 02/06/17 2000 02/08/17 0015 02/10/17 0730  AST 18 33  --   --   ALT 12* 17  --   --   [facility]  46 66  --   --   BILITOT 1.0 1.2  --   --   PROT 6.6 8.2*  --   --   ALBUMIN 3.0* 3.7 2.7* 2.7*    Recent Labs Lab 02/06/17 2000  02/07/17 0431 02/08/17 0015 02/09/17 0302  WBC 10.3  --  8.3 4.8 6.6  NEUTROABS 8.9*  --   --   --   --   HGB 12.9  < > 12.4 9.7* 10.7*  HCT 41.1  < > 40.2 30.8* 34.7*  MCV 96.3  --  95.9 94.8 96.1  PLT 163  --  154 129* 148*  < > = values in this interval not displayed. Iron/TIBC/Ferritin/ %Sat    Component Value Date/Time   IRON 54 01/29/2014 0720   TIBC 210 (L) 01/29/2014 0720   FERRITIN 726 (H) 02/09/2014 0719   IRONPCTSAT 26 01/29/2014 0720

## 2017-02-10 NOTE — Progress Notes (Signed)
MV results reviewed with Dr Radford Pax.  MV is high risk>>>cath indicated.  CABG 2012 [place], [place], no info on grafts available. Pt on board, orders written.   Nm Myocar Multi W/spect W/wall Motion / Ef  Result Date: 02/10/2017  Horizontal ST segment depression ST segment depression was noted during stress in the II, aVF, V5, V6 and III leads.  Findings consistent with ischemia.  The left ventricular ejection fraction is severely decreased (<30%).  This is a high risk study.  TID 1.29 Large area of inferior lateral ischemia from apex to base Diffuse hypokinesis EF 29%   Rosaria Ferries, PA-C 02/10/2017 4:38 PM Beeper [phone]

## 2017-02-10 NOTE — Clinical Social Work Note (Signed)
CSW received a call from MD regarding patient and her discharge disposition. Per MD, conversation had with patient and she is now agreeable to SNF. CSW received a call from [place], Engineer, site with [facility] regarding patient. CSW informed that patient was a regular visitor to facility after her discharge to visit with staff and residents. Per Clarene Critchley, patient called to inform them that she was in the hospital. CSW visited with patient at 5:20 pm and she is now agreeable to SNF placement and will return to [facility] if they have bed availability.  Call made to [facility], admissions director of [facility] regarding patient discharging to their facility on Friday, and they can accept her. CSW will transmit clinicals to [place] [facility] on 11/2 and patient will discharge same date.  Ananda Caya Givens, MSW, LCSW Licensed Clinical Social Worker [place] [phone]

## 2017-02-10 NOTE — Progress Notes (Signed)
   Brandi Arellano presented for a nuclear stress test today.  No immediate complications.  Stress imaging is pending at this time.  Preliminary EKG findings may be listed in the chart, but the stress test result will not be finalized until perfusion imaging is complete.  Tami Lin Haeleigh Streiff, PA-C 02/10/2017, 10:25 AM

## 2017-02-10 NOTE — Progress Notes (Signed)
Nuclear stress test is high risk with a large area of inferior lateral ischemia from the apex to the base with horizontal ST segment depression in the inferior leads and lateral precordial leads during Lexiscan infusion with EF calculated at 29%.  2D echo by 2D echo showed EF 45%.  Recommend proceeding with left heart catheterization in the a.m. to assess coronary artery anatomy

## 2017-02-11 ENCOUNTER — Encounter (HOSPITAL_COMMUNITY)
Admission: EM | Disposition: A | Discharge status: Home / Self Care | Payer: Self-pay | Source: Home / Self Care | Attending: Family Medicine

## 2017-02-11 DIAGNOSIS — I2583 Coronary atherosclerosis due to lipid rich plaque: Secondary | ICD-10-CM

## 2017-02-11 DIAGNOSIS — I2510 Atherosclerotic heart disease of native coronary artery without angina pectoris: Secondary | ICD-10-CM

## 2017-02-11 DIAGNOSIS — R9439 Abnormal result of other cardiovascular function study: Secondary | ICD-10-CM

## 2017-02-11 DIAGNOSIS — R109 Unspecified abdominal pain: Secondary | ICD-10-CM

## 2017-02-11 DIAGNOSIS — I208 Other forms of angina pectoris: Secondary | ICD-10-CM | POA: Diagnosis present

## 2017-02-11 HISTORY — PX: LEFT HEART CATH AND CORS/GRAFTS ANGIOGRAPHY: CATH118250

## 2017-02-11 LAB — RENAL FUNCTION PANEL
ALBUMIN: 2.7 g/dL — AB (ref 3.5–5.0)
ANION GAP: 8 (ref 5–15)
BUN: 45 mg/dL — AB (ref 6–20)
CO2: 27 mmol/L (ref 22–32)
Calcium: 9.3 mg/dL (ref 8.9–10.3)
Chloride: 96 mmol/L — ABNORMAL LOW (ref 101–111)
Creatinine, Ser: 6.62 mg/dL — ABNORMAL HIGH (ref 0.44–1.00)
GFR calc Af Amer: 7 mL/min — ABNORMAL LOW (ref >60)
GFR, EST NON AFRICAN AMERICAN: 6 mL/min — AB (ref >60)
Glucose, Bld: 152 mg/dL — ABNORMAL HIGH (ref 65–99)
PHOSPHORUS: 3.1 mg/dL (ref 2.5–4.6)
POTASSIUM: 4.5 mmol/L (ref 3.5–5.1)
Sodium: 131 mmol/L — ABNORMAL LOW (ref 135–145)

## 2017-02-11 LAB — CBC
HEMATOCRIT: 32.3 % — AB (ref 36.0–46.0)
HEMOGLOBIN: 10.1 g/dL — AB (ref 12.0–15.0)
MCH: 29.9 pg (ref 26.0–34.0)
MCHC: 31.3 g/dL (ref 30.0–36.0)
MCV: 95.6 fL (ref 78.0–100.0)
Platelets: 135 10*3/uL — ABNORMAL LOW (ref 150–400)
RBC: 3.38 MIL/uL — AB (ref 3.87–5.11)
RDW: 16.6 % — ABNORMAL HIGH (ref 11.5–15.5)
WBC: 5.4 10*3/uL (ref 4.0–10.5)

## 2017-02-11 LAB — PROTIME-INR
INR: 1.03
Prothrombin Time: 13.4 seconds (ref 11.4–15.2)

## 2017-02-11 LAB — GLUCOSE, CAPILLARY
GLUCOSE-CAPILLARY: 104 mg/dL — AB (ref 65–99)
GLUCOSE-CAPILLARY: 97 mg/dL (ref 65–99)
Glucose-Capillary: 197 mg/dL — ABNORMAL HIGH (ref 65–99)
Glucose-Capillary: 397 mg/dL — ABNORMAL HIGH (ref 65–99)
Glucose-Capillary: 91 mg/dL (ref 65–99)
Glucose-Capillary: 94 mg/dL (ref 65–99)

## 2017-02-11 SURGERY — LEFT HEART CATH AND CORS/GRAFTS ANGIOGRAPHY
Anesthesia: LOCAL

## 2017-02-11 MED ORDER — LIDOCAINE HCL (PF) 1 % IJ SOLN
INTRAMUSCULAR | Status: DC | PRN
Start: 2017-02-11 — End: 2017-02-11
  Administered 2017-02-11: 10 mL

## 2017-02-11 MED ORDER — HEPARIN SODIUM (PORCINE) 5000 UNIT/ML IJ SOLN
5000.0000 [IU] | Freq: Three times a day (TID) | INTRAMUSCULAR | Status: DC
  Administered 2017-02-12 – 2017-03-15 (×76): 5000 [IU] via SUBCUTANEOUS
  Filled 2017-02-11 (×74): qty 1

## 2017-02-11 MED ORDER — FENTANYL CITRATE (PF) 100 MCG/2ML IJ SOLN
INTRAMUSCULAR | Status: DC | PRN
  Administered 2017-02-11: 25 ug via INTRAVENOUS

## 2017-02-11 MED ORDER — SODIUM CHLORIDE 0.9 % IV SOLN: 250.0000 mL | INTRAVENOUS | Status: DC | PRN

## 2017-02-11 MED ORDER — HEPARIN SODIUM (PORCINE) 1000 UNIT/ML DIALYSIS: 4000.0000 [IU] | Freq: Once | INTRAMUSCULAR | Status: DC

## 2017-02-11 MED ORDER — LIDOCAINE HCL (PF) 1 % IJ SOLN: 5.0000 mL | INTRAMUSCULAR | Status: DC | PRN

## 2017-02-11 MED ORDER — FENTANYL CITRATE (PF) 100 MCG/2ML IJ SOLN
INTRAMUSCULAR | Status: AC
  Filled 2017-02-11: qty 2

## 2017-02-11 MED ORDER — MIDAZOLAM HCL 2 MG/2ML IJ SOLN
INTRAMUSCULAR | Status: AC
  Filled 2017-02-11: qty 2

## 2017-02-11 MED ORDER — SODIUM CHLORIDE 0.9% FLUSH: 3.0000 mL | INTRAVENOUS | Status: DC | PRN

## 2017-02-11 MED ORDER — LIDOCAINE-PRILOCAINE 2.5-2.5 % EX CREA: 1.0000 "application " | TOPICAL_CREAM | CUTANEOUS | Status: DC | PRN

## 2017-02-11 MED ORDER — PENTAFLUOROPROP-TETRAFLUOROETH EX AERO: 1.0000 "application " | INHALATION_SPRAY | CUTANEOUS | Status: DC | PRN

## 2017-02-11 MED ORDER — LIDOCAINE HCL (PF) 1 % IJ SOLN
INTRAMUSCULAR | Status: AC
  Filled 2017-02-11: qty 30

## 2017-02-11 MED ORDER — HEPARIN (PORCINE) IN NACL 2-0.9 UNIT/ML-% IJ SOLN
INTRAMUSCULAR | Status: AC
  Filled 2017-02-11: qty 1000

## 2017-02-11 MED ORDER — HEPARIN SODIUM (PORCINE) 1000 UNIT/ML DIALYSIS: 1000.0000 [IU] | INTRAMUSCULAR | Status: DC | PRN

## 2017-02-11 MED ORDER — DOXERCALCIFEROL 4 MCG/2ML IV SOLN
INTRAVENOUS | Status: AC
  Filled 2017-02-11: qty 2

## 2017-02-11 MED ORDER — SODIUM CHLORIDE 0.9 % IV SOLN: 100.0000 mL | INTRAVENOUS | Status: DC | PRN

## 2017-02-11 MED ORDER — HEPARIN (PORCINE) IN NACL 2-0.9 UNIT/ML-% IJ SOLN
INTRAMUSCULAR | Status: AC | PRN
  Administered 2017-02-11: 1000 mL

## 2017-02-11 MED ORDER — ALTEPLASE 2 MG IJ SOLR: 2.0000 mg | Freq: Once | INTRAMUSCULAR | Status: DC | PRN

## 2017-02-11 MED ORDER — SODIUM CHLORIDE 0.9% FLUSH
3.0000 mL | Freq: Two times a day (BID) | INTRAVENOUS | Status: DC
  Administered 2017-02-11 – 2017-02-12 (×2): 3 mL via INTRAVENOUS

## 2017-02-11 MED ORDER — METOPROLOL TARTRATE 25 MG PO TABS
25.0000 mg | ORAL_TABLET | Freq: Two times a day (BID) | ORAL | Status: DC
  Administered 2017-02-11 – 2017-03-15 (×60): 25 mg via ORAL
  Filled 2017-02-11 (×63): qty 1

## 2017-02-11 MED ORDER — MIDAZOLAM HCL 2 MG/2ML IJ SOLN
INTRAMUSCULAR | Status: DC | PRN
  Administered 2017-02-11: 1 mg via INTRAVENOUS

## 2017-02-11 MED ORDER — IOPAMIDOL (ISOVUE-370) INJECTION 76%
INTRAVENOUS | Status: DC | PRN
  Administered 2017-02-11: 75 mL via INTRA_ARTERIAL

## 2017-02-11 MED ORDER — IOPAMIDOL (ISOVUE-370) INJECTION 76%
INTRAVENOUS | Status: AC
  Filled 2017-02-11: qty 125

## 2017-02-11 SURGICAL SUPPLY — 11 items
CATH INFINITI 5 FR IM (CATHETERS) ×1 IMPLANT
CATH INFINITI 5FR MULTPACK ANG (CATHETERS) ×1 IMPLANT
COVER PRB 48X5XTLSCP FOLD TPE (BAG) IMPLANT
COVER PROBE 5X48 (BAG) ×2
GUIDEWIRE ANGLED .035X150CM (WIRE) ×1 IMPLANT
KIT HEART LEFT (KITS) ×2 IMPLANT
KIT MICROINTRODUCER STIFF 5F (SHEATH) ×1 IMPLANT
PACK CARDIAC CATHETERIZATION (CUSTOM PROCEDURE TRAY) ×2 IMPLANT
SHEATH PINNACLE 5F 10CM (SHEATH) ×1 IMPLANT
TRANSDUCER W/STOPCOCK (MISCELLANEOUS) ×2 IMPLANT
TUBING CIL FLEX 10 FLL-RA (TUBING) ×2 IMPLANT

## 2017-02-11 NOTE — Progress Notes (Signed)
Per day RN patient is unaware of procedure (heart cath) for tomorrow. Consent has not been given to patient but appropriate labs have been checked. Some orders not released at this time.

## 2017-02-11 NOTE — H&P (View-Only) (Signed)
Progress Note  Patient Name: Brandi Arellano Date of Encounter: 02/11/2017  Primary Cardiologist: New  Subjective   Denies any chest pain or SOB  Inpatient Medications    Scheduled Meds: . amLODipine  5 mg Oral Daily  . aspirin EC  81 mg Oral BID  . atorvastatin  40 mg Oral QPM  . chlorhexidine  15 mL Mouth Rinse BID  . docusate sodium  100 mg Oral Daily  . doxercalciferol  4 mcg Intravenous Q M,W,F-HD  . folic acid  1 mg Oral Daily  . gabapentin  100 mg Oral BID  . [START ON 02/12/2017] heparin  4,000 Units Dialysis Once in dialysis  . heparin  5,000 Units Subcutaneous Q8H  . insulin aspart  0-9 Units Subcutaneous Q4H  . lanthanum  1,000 mg Oral TID WC  . levothyroxine  25 mcg Oral QAC breakfast  . mouth rinse  15 mL Mouth Rinse q12n4p  . multivitamin with minerals  1 tablet Oral Daily  . sodium chloride flush  3 mL Intravenous Q12H  . sodium chloride flush  3 mL Intravenous Q12H  . thiamine  100 mg Oral Daily   Continuous Infusions: . sodium chloride    . sodium chloride    . sodium chloride    . sodium chloride    . sodium chloride    . sodium chloride     PRN Meds: sodium chloride, sodium chloride, sodium chloride, sodium chloride, sodium chloride, acetaminophen **OR** acetaminophen, alteplase, heparin, lidocaine (PF), lidocaine-prilocaine, pentafluoroprop-tetrafluoroeth, sodium chloride flush   Vital Signs    Vitals:   02/11/17 0742 02/11/17 0800 02/11/17 0830 02/11/17 0900  BP: (!) 174/90 (!) 170/88 (!) 168/87 (!) 153/78  Pulse: 77 76 77 74  Resp: 16 16 15    Temp:      TempSrc:      SpO2: 97% 98% 98%   Weight: 123 lb 3.8 oz (55.9 kg)     Height:        Intake/Output Summary (Last 24 hours) at 02/11/17 0923 Last data filed at 02/11/17 0600  Gross per 24 hour  Intake              120 ml  Output              600 ml  Net             -480 ml   Filed Weights   02/09/17 1057 02/09/17 2026 02/11/17 0742  Weight: 117 lb 11.6 oz (53.4 kg) 117 lb 11.6 oz  (53.4 kg) 123 lb 3.8 oz (55.9 kg)    Telemetry    NSR - Personally Reviewed  ECG    No new EKG to review - Personally Reviewed  Physical Exam   GEN: No acute distress.   Neck: No JVD Cardiac: RRR, no murmurs, rubs, or gallops.  Respiratory: Clear to auscultation bilaterally. GI: Soft, nontender, non-distended  MS: No edema; No deformity. Neuro:  Nonfocal  Psych: Normal affect   Labs    Chemistry Recent Labs Lab 02/06/17 0200 02/06/17 2000  02/08/17 0015 02/09/17 0302 02/10/17 0730  NA 135 135  < > 132* 133* 132*  K 4.4 4.4  < > 4.7 4.1 4.2  CL 100* 96*  < > 98* 98* 96*  CO2 22 19*  < > 20* 23 27  GLUCOSE 198* 236*  < > 189* 176* 141*  BUN 53* 65*  < > 81* 42* 24*  CREATININE 7.90* 8.81*  < > 9.55*  6.52* 4.80*  CALCIUM 8.4* 9.2  < > 7.5* 8.3* 8.5*  PROT 6.6 8.2*  --   --   --   --   ALBUMIN 3.0* 3.7  --  2.7*  --  2.7*  AST 18 33  --   --   --   --   ALT 12* 17  --   --   --   --   ALKPHOS 46 66  --   --   --   --   BILITOT 1.0 1.2  --   --   --   --   GFRNONAA 5* 4*  < > 4* 6* 9*  GFRAA 6* 5*  < > 5* 7* 11*  ANIONGAP 13 20*  < > 14 12 9   < > = values in this interval not displayed.   Hematology Recent Labs Lab 02/07/17 0431 02/08/17 0015 02/09/17 0302  WBC 8.3 4.8 6.6  RBC 4.19 3.25* 3.61*  HGB 12.4 9.7* 10.7*  HCT 40.2 30.8* 34.7*  MCV 95.9 94.8 96.1  MCH 29.6 29.8 29.6  MCHC 30.8 31.5 30.8  RDW 16.7* 17.0* 16.7*  PLT 154 129* 148*    Cardiac EnzymesNo results for input(s): TROPONINI in the last 168 hours. No results for input(s): TROPIPOC in the last 168 hours.   BNPNo results for input(s): BNP, PROBNP in the last 168 hours.   DDimer No results for input(s): DDIMER in the last 168 hours.   Radiology    Nm Myocar Multi W/spect W/wall Motion / Ef  Result Date: 02/10/2017  Horizontal ST segment depression ST segment depression was noted during stress in the II, aVF, V5, V6 and III leads.  Findings consistent with ischemia.  The left  ventricular ejection fraction is severely decreased (<30%).  This is a high risk study.  TID 1.29 Large area of inferior lateral ischemia from apex to base Diffuse hypokinesis EF 29%    Cardiac Studies   Nuclear stress test 02/2017 Study Result     Horizontal ST segment depression ST segment depression was noted during stress in the II, aVF, V5, V6 and III leads.  Findings consistent with ischemia.  The left ventricular ejection fraction is severely decreased (<30%).  This is a high risk study.   TID 1.29 Large area of inferior lateral ischemia from apex to base Diffuse hypokinesis EF 29%      Patient Profile     61 y.o. female history of ASCAD s/p remote CABG in 2012 with no ischemic workup since then.  She also has HTN, hyperlipidemia, ESRD on HD and PVD needing surgical resection of a bladder mass  Assessment & Plan    1.   Preoperative evaluation to rule out surgical contraindication - She requires an intermediate risk non emergent urological surgery and has a revised cardiac risk index of 3 (creatinine > 2, CAD and DM) with combined surgical and clinical risk of 1-5% and has poor functional status unable to ambulate currently due to lack of leg prosthesis and has  CP when getting emotionally upset so nuclear stress test done yesterday for further risk assessment. - stress test was high risk with ischemia in the inferolateral wall from the base to apex with EF 29% and TID of 1.29.   - give her episodes of CP recommend proceeding with cardiac catheterization later today.  She is NPO - Cardiac catheterization was discussed with the patient fully. The patient understands that risks include but are not limited to stroke (  1 in 1000), death (1 in 26), kidney failure [usually temporary] (1 in 500), bleeding (1 in 200), allergic reaction [possibly serious] (1 in 200).  The patient understands and is willing to proceed.    2.  ASCAD s/p remote CABG - see above - continue ASA,  statin - add lopressor 25mg  BID  3.  ESRD on hemodialysis ([place]) - had HD this am, tolerated well  4.  Bladder mass - for procedure in 1-2 weeks.  5.  HTN - BP elevated - continue amlodipine - starting BB  For questions or updates, please contact [place] Please consult [email or web address] for contact info under Cardiology/STEMI.      Signed, Fransico Him, MD  02/11/2017, 9:23 AM

## 2017-02-11 NOTE — Brief Op Note (Signed)
BRIEF CARDIAC CATHETERIZATION NOTE  DATE: 02/11/2017 TIME: 8:26 PM  PATIENT:  Brandi Arellano  61 y.o. female  PRE-OPERATIVE DIAGNOSIS:  Abnormal Stress Test  POST-OPERATIVE DIAGNOSIS:  Multivessel coronary artery disease  PROCEDURE:  Procedure(s): LEFT HEART CATH AND CORS/GRAFTS ANGIOGRAPHY (N/A)  SURGEON:  Surgeon(s) and Role:    Nelva Bush, MD - Primary  FINDINGS: 1. Severe, diffuse native coronary artery disease. 2. Patent LIMA->LAD with 40-50% ostial stenosis. 3. Patent SVG->OM and SVG->rPDA. 4. Patent stent in left subclavian artery. 5. Normal left ventricular filling pressure.  RECOMMENDATIONS: 1. Medical therapy. No targets for percutaneous intervention.  Nelva Bush, MD [facility] Pager: [phone]

## 2017-02-11 NOTE — Progress Notes (Signed)
Patient refused to have both groin and rt arm/wrist be clipped. Per patient,nobody talked to her about any procedure she's supposed to have.She wants to talk to the MD first. MD on call not aware of patient's scheduled cardiac catheterization. [place], Brandi Arellano, Therapist, sports
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
## 2017-02-11 NOTE — Progress Notes (Signed)
Progress Note  Patient Name: Brandi Arellano Date of Encounter: 02/11/2017  Primary Cardiologist: New  Subjective   Denies any chest pain or SOB  Inpatient Medications    Scheduled Meds: . amLODipine  5 mg Oral Daily  . aspirin EC  81 mg Oral BID  . atorvastatin  40 mg Oral QPM  . chlorhexidine  15 mL Mouth Rinse BID  . docusate sodium  100 mg Oral Daily  . doxercalciferol  4 mcg Intravenous Q M,W,F-HD  . folic acid  1 mg Oral Daily  . gabapentin  100 mg Oral BID  . [START ON 02/12/2017] heparin  4,000 Units Dialysis Once in dialysis  . heparin  5,000 Units Subcutaneous Q8H  . insulin aspart  0-9 Units Subcutaneous Q4H  . lanthanum  1,000 mg Oral TID WC  . levothyroxine  25 mcg Oral QAC breakfast  . mouth rinse  15 mL Mouth Rinse q12n4p  . multivitamin with minerals  1 tablet Oral Daily  . sodium chloride flush  3 mL Intravenous Q12H  . sodium chloride flush  3 mL Intravenous Q12H  . thiamine  100 mg Oral Daily   Continuous Infusions: . sodium chloride    . sodium chloride    . sodium chloride    . sodium chloride    . sodium chloride    . sodium chloride     PRN Meds: sodium chloride, sodium chloride, sodium chloride, sodium chloride, sodium chloride, acetaminophen **OR** acetaminophen, alteplase, heparin, lidocaine (PF), lidocaine-prilocaine, pentafluoroprop-tetrafluoroeth, sodium chloride flush   Vital Signs    Vitals:   02/11/17 0742 02/11/17 0800 02/11/17 0830 02/11/17 0900  BP: (!) 174/90 (!) 170/88 (!) 168/87 (!) 153/78  Pulse: 77 76 77 74  Resp: 16 16 15    Temp:      TempSrc:      SpO2: 97% 98% 98%   Weight: 123 lb 3.8 oz (55.9 kg)     Height:        Intake/Output Summary (Last 24 hours) at 02/11/17 0923 Last data filed at 02/11/17 0600  Gross per 24 hour  Intake              120 ml  Output              600 ml  Net             -480 ml   Filed Weights   02/09/17 1057 02/09/17 2026 02/11/17 0742  Weight: 117 lb 11.6 oz (53.4 kg) 117 lb 11.6 oz  (53.4 kg) 123 lb 3.8 oz (55.9 kg)    Telemetry    NSR - Personally Reviewed  ECG    No new EKG to review - Personally Reviewed  Physical Exam   GEN: No acute distress.   Neck: No JVD Cardiac: RRR, no murmurs, rubs, or gallops.  Respiratory: Clear to auscultation bilaterally. GI: Soft, nontender, non-distended  MS: No edema; No deformity. Neuro:  Nonfocal  Psych: Normal affect   Labs    Chemistry Recent Labs Lab 02/06/17 0200 02/06/17 2000  02/08/17 0015 02/09/17 0302 02/10/17 0730  NA 135 135  < > 132* 133* 132*  K 4.4 4.4  < > 4.7 4.1 4.2  CL 100* 96*  < > 98* 98* 96*  CO2 22 19*  < > 20* 23 27  GLUCOSE 198* 236*  < > 189* 176* 141*  BUN 53* 65*  < > 81* 42* 24*  CREATININE 7.90* 8.81*  < > 9.55*  6.52* 4.80*  CALCIUM 8.4* 9.2  < > 7.5* 8.3* 8.5*  PROT 6.6 8.2*  --   --   --   --   ALBUMIN 3.0* 3.7  --  2.7*  --  2.7*  AST 18 33  --   --   --   --   ALT 12* 17  --   --   --   --   ALKPHOS 46 66  --   --   --   --   BILITOT 1.0 1.2  --   --   --   --   GFRNONAA 5* 4*  < > 4* 6* 9*  GFRAA 6* 5*  < > 5* 7* 11*  ANIONGAP 13 20*  < > 14 12 9   < > = values in this interval not displayed.   Hematology Recent Labs Lab 02/07/17 0431 02/08/17 0015 02/09/17 0302  WBC 8.3 4.8 6.6  RBC 4.19 3.25* 3.61*  HGB 12.4 9.7* 10.7*  HCT 40.2 30.8* 34.7*  MCV 95.9 94.8 96.1  MCH 29.6 29.8 29.6  MCHC 30.8 31.5 30.8  RDW 16.7* 17.0* 16.7*  PLT 154 129* 148*    Cardiac EnzymesNo results for input(s): TROPONINI in the last 168 hours. No results for input(s): TROPIPOC in the last 168 hours.   BNPNo results for input(s): BNP, PROBNP in the last 168 hours.   DDimer No results for input(s): DDIMER in the last 168 hours.   Radiology    Nm Myocar Multi W/spect W/wall Motion / Ef  Result Date: 02/10/2017  Horizontal ST segment depression ST segment depression was noted during stress in the II, aVF, V5, V6 and III leads.  Findings consistent with ischemia.  The left  ventricular ejection fraction is severely decreased (<30%).  This is a high risk study.  TID 1.29 Large area of inferior lateral ischemia from apex to base Diffuse hypokinesis EF 29%    Cardiac Studies   Nuclear stress test 02/2017 Study Result     Horizontal ST segment depression ST segment depression was noted during stress in the II, aVF, V5, V6 and III leads.  Findings consistent with ischemia.  The left ventricular ejection fraction is severely decreased (<30%).  This is a high risk study.   TID 1.29 Large area of inferior lateral ischemia from apex to base Diffuse hypokinesis EF 29%      Patient Profile     61 y.o. female history of ASCAD s/p remote CABG in 2012 with no ischemic workup since then.  She also has HTN, hyperlipidemia, ESRD on HD and PVD needing surgical resection of a bladder mass  Assessment & Plan    1.   Preoperative evaluation to rule out surgical contraindication - She requires an intermediate risk non emergent urological surgery and has a revised cardiac risk index of 3 (creatinine > 2, CAD and DM) with combined surgical and clinical risk of 1-5% and has poor functional status unable to ambulate currently due to lack of leg prosthesis and has  CP when getting emotionally upset so nuclear stress test done yesterday for further risk assessment. - stress test was high risk with ischemia in the inferolateral wall from the base to apex with EF 29% and TID of 1.29.   - give her episodes of CP recommend proceeding with cardiac catheterization later today.  She is NPO - Cardiac catheterization was discussed with the patient fully. The patient understands that risks include but are not limited to stroke (  1 in 1000), death (1 in 33), kidney failure [usually temporary] (1 in 500), bleeding (1 in 200), allergic reaction [possibly serious] (1 in 200).  The patient understands and is willing to proceed.    2.  ASCAD s/p remote CABG - see above - continue ASA,  statin - add lopressor 25mg  BID  3.  ESRD on hemodialysis ([place]) - had HD this am, tolerated well  4.  Bladder mass - for procedure in 1-2 weeks.  5.  HTN - BP elevated - continue amlodipine - starting BB  For questions or updates, please contact [place] Please consult [email or web address] for contact info under Cardiology/STEMI.      Signed, Fransico Him, MD  02/11/2017, 9:23 AM

## 2017-02-11 NOTE — Interval H&P Note (Signed)
History and Physical Interval Note:  02/11/2017 7:29 PM  Brandi Arellano  has presented today for cardiac catheterization, with the diagnosis of chest pain and abnormal stress test. The various methods of treatment have been discussed with the patient and family. After consideration of risks, benefits and other options for treatment, the patient has consented to  Procedure(s): LEFT HEART CATH AND CORS/GRAFTS ANGIOGRAPHY (N/A) as a surgical intervention .  The patient's history has been reviewed, patient examined, no change in status, stable for surgery.  I have reviewed the patient's chart and labs.  Questions were answered to the patient's satisfaction.    Cath Lab Visit (complete for each Cath Lab visit)  Clinical Evaluation Leading to the Procedure:   ACS: No.  Non-ACS:    Anginal Classification: CCS III  Anti-ischemic medical therapy: Minimal Therapy (1 class of medications)  Non-Invasive Test Results: High-risk stress test findings: cardiac mortality >3%/year  Prior CABG: Previous CABG  Laria Grimmett

## 2017-02-11 NOTE — Progress Notes (Signed)
[facility] Progress Note  Subjective: had a highly +nuclear cardiac stress test, going for heart cath today.   Vitals:   02/11/17 0930 02/11/17 1000 02/11/17 1030 02/11/17 1210  BP: (!) 168/83 130/75 134/75 117/66  Pulse: 66 70 70 66  Resp:    16  Temp:    98.2 F (36.8 C)  TempSrc:    Oral  SpO2:    97%  Weight:      Height:        Inpatient medications: . amLODipine  5 mg Oral Daily  . aspirin EC  81 mg Oral BID  . atorvastatin  40 mg Oral QPM  . chlorhexidine  15 mL Mouth Rinse BID  . docusate sodium  100 mg Oral Daily  . doxercalciferol  4 mcg Intravenous Q M,W,F-HD  . folic acid  1 mg Oral Daily  . gabapentin  100 mg Oral BID  . heparin  5,000 Units Subcutaneous Q8H  . insulin aspart  0-9 Units Subcutaneous Q4H  . lanthanum  1,000 mg Oral TID WC  . levothyroxine  25 mcg Oral QAC breakfast  . mouth rinse  15 mL Mouth Rinse q12n4p  . metoprolol tartrate  25 mg Oral BID  . multivitamin with minerals  1 tablet Oral Daily  . sodium chloride flush  3 mL Intravenous Q12H  . sodium chloride flush  3 mL Intravenous Q12H  . thiamine  100 mg Oral Daily   . sodium chloride    . sodium chloride    . sodium chloride    . sodium chloride     sodium chloride, sodium chloride, sodium chloride, acetaminophen **OR** acetaminophen, sodium chloride flush  Exam: General: WDWN Female NAD using nasal O2 Head: NCAT sclera not icteric MMM Neck: Supple. No JVD CEA scars bilaterally Lungs: Breathing unlabored CTAB  Heart: RRR with S1 S2 3/6 systolic EM Abdomen: soft NT + BS Lower extremities: R AKA, L TMA no LE edema  Neuro: alert, Ox 3 today, much better  Psych:  Flat affect, drowsy  Dialysis Access: R IJ TDC clean exit (new cath just replaced here)  Dialysis: [place] MWF 4h   56.5kg  2/2.25  Hep 4000   R IJ TDC (new one placed yest 10/29) -hect 4 ug tiw -BMM: Fosrenol 1000mg  2 tabs tid     Impression: 1. Cardiac stress + for ischemia/ hx remote CABG - for heart  cath today after HD 2. UTI/ urinary retention - per primary/ urology;  foley in place 3. Broken TDC - replaced on 10/29, new cath working well.  4. ESRD - MWF HD (cath-dependent, not candidate for perm access due to severe PAD) 5. HTN/ vol - cont amlodipine, 1kg under dry 6. Anemia of CKD - Hb up , no need esa for now 7. 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 DM per primary  9. Bladder mass - urology evaluating 10. SI - psych said no SI, and no need medications  Plan - HD today, min UF   [name] MD [facility] pager [phone]   02/11/2017, 1:32 PM    Recent Labs Lab 02/08/17 0015 02/09/17 0302 02/10/17 0730 02/11/17 0950  NA 132* 133* 132* 131*  K 4.7 4.1 4.2 4.5  CL 98* 98* 96* 96*  CO2 20* 23 27 27   GLUCOSE 189* 176* 141* 152*  BUN 81* 42* 24* 45*  CREATININE 9.55* 6.52* 4.80* 6.62*  CALCIUM 7.5* 8.3* 8.5* 9.3  PHOS 7.6*  --  3.0 3.1    Recent Labs Lab 02/06/17 0200 02/06/17 2000 02/08/17 0015 02/10/17 0730 02/11/17 0950  AST 18 33  --   --   --   ALT 12* 17  --   --   --   ALKPHOS 46 66  --   --   --   BILITOT 1.0 1.2  --   --   --   PROT 6.6 8.2*  --   --   --   ALBUMIN 3.0* 3.7 2.7* 2.7* 2.7*    Recent Labs Lab 02/06/17 2000  02/08/17 0015 02/09/17 0302 02/11/17 0900  WBC 10.3  < > 4.8 6.6 5.4  NEUTROABS 8.9*  --   --   --   --   HGB 12.9  < > 9.7* 10.7* 10.1*  HCT 41.1  < > 30.8* 34.7* 32.3*  MCV 96.3  < > 94.8 96.1 95.6  PLT 163  < > 129* 148* 135*  < > = values in this interval not displayed. Iron/TIBC/Ferritin/ %Sat    Component Value Date/Time   IRON 54 01/29/2014 0720   TIBC 210 (L) 01/29/2014 0720   FERRITIN 726 (H) 02/09/2014 0719   IRONPCTSAT 26 01/29/2014 0720

## 2017-02-11 NOTE — Progress Notes (Signed)
Cardiology Note  Date: 02/11/17 Time: 7:34 PM  Patient is currently listed as a partial code in [place]. It indicates that she would not want to be intubated. She is agreeable to all resuscitative measures, including CPR, defibrillation, and intubation if needed during the catheterization.  Nelva Bush, MD [facility] Pager: [phone]

## 2017-02-11 NOTE — Progress Notes (Signed)
FPTS Interim Progress Note  S:Feels hungry. Has not had cath yet, so she is still NPO. Otherwise, no complaints. Nurse said that Cards was coming by soon to perform the cath.   O: BP (!) 157/66 (BP Location: Right Arm)   Pulse 64   Temp 98.2 F (36.8 C) (Oral)   Resp 17   Ht 5\' 6"  (1.676 m)   Wt 123 lb 3.8 oz (55.9 kg)   SpO2 96%   BMI 19.89 kg/m   General: NAD, well appearing CVS: II/VI murmer, regular rate Lungs: CTAB, no increased work of breathing Abdomen: Soft, nontender, nondistended MSK: No lower extremity edema, 2+ dp   A/P: Awaiting cardiac catheterization. Will follow cardiology recommendations. SNF placement pending results. Likely home tomorrow.   Bonnita Hollow, MD 02/11/2017, 4:59 PM PGY-1, [place] Medicine Service pager [phone]

## 2017-02-12 LAB — CULTURE, BLOOD (ROUTINE X 2)
Culture: NO GROWTH
Culture: NO GROWTH
SPECIAL REQUESTS: ADEQUATE
Special Requests: ADEQUATE

## 2017-02-12 LAB — GLUCOSE, CAPILLARY
GLUCOSE-CAPILLARY: 241 mg/dL — AB (ref 65–99)
Glucose-Capillary: 179 mg/dL — ABNORMAL HIGH (ref 65–99)
Glucose-Capillary: 57 mg/dL — ABNORMAL LOW (ref 65–99)
Glucose-Capillary: 316 mg/dL — ABNORMAL HIGH (ref 65–99)
Glucose-Capillary: 97 mg/dL (ref 65–99)

## 2017-02-12 MED ORDER — CYCLOBENZAPRINE HCL 5 MG PO TABS
5.0000 mg | ORAL_TABLET | Freq: Once | ORAL | Status: DC
  Filled 2017-02-12 (×2): qty 1

## 2017-02-12 NOTE — Progress Notes (Signed)
Progress Note  Patient Name: Oneka Parada Date of Encounter: 02/12/2017  Primary Cardiologist: New  Subjective   No chest pain or dyspnea  Inpatient Medications    Scheduled Meds: . amLODipine  5 mg Oral Daily  . aspirin EC  81 mg Oral BID  . atorvastatin  40 mg Oral QPM  . chlorhexidine  15 mL Mouth Rinse BID  . cyclobenzaprine  5 mg Oral Once  . docusate sodium  100 mg Oral Daily  . doxercalciferol  4 mcg Intravenous Q M,W,F-HD  . folic acid  1 mg Oral Daily  . gabapentin  100 mg Oral BID  . heparin  5,000 Units Subcutaneous Q8H  . insulin aspart  0-9 Units Subcutaneous Q4H  . lanthanum  1,000 mg Oral TID WC  . levothyroxine  25 mcg Oral QAC breakfast  . mouth rinse  15 mL Mouth Rinse q12n4p  . metoprolol tartrate  25 mg Oral BID  . multivitamin with minerals  1 tablet Oral Daily  . sodium chloride flush  3 mL Intravenous Q12H  . sodium chloride flush  3 mL Intravenous Q12H  . thiamine  100 mg Oral Daily   Continuous Infusions: . sodium chloride     PRN Meds: sodium chloride, acetaminophen **OR** acetaminophen, sodium chloride flush   Vital Signs    Vitals:   02/11/17 2347 02/12/17 0444 02/12/17 0817 02/12/17 0956  BP: (!) 103/49 (!) 100/53 (!) 143/75 134/74  Pulse: (!) 57 (!) 56 63 (!) 52  Resp: 18 17  17   Temp: (!) 97.4 F (36.3 C) 98.9 F (37.2 C)  98.7 F (37.1 C)  TempSrc: Oral Oral  Oral  SpO2: 96% 96%  97%  Weight:  122 lb (55.3 kg)    Height:        Intake/Output Summary (Last 24 hours) at 02/12/17 1330 Last data filed at 02/12/17 0948  Gross per 24 hour  Intake              360 ml  Output              375 ml  Net              -15 ml   Filed Weights   02/11/17 0742 02/11/17 1112 02/12/17 0444  Weight: 123 lb 3.8 oz (55.9 kg) 119 lb 0.8 oz (54 kg) 122 lb (55.3 kg)    Telemetry    NSR - Personally Reviewed  Physical Exam   GEN: No acute distress.  WD/WN Neck: No JVD, supple Cardiac: RRR, 2/6 systolic murmur Respiratory: Clear  to auscultation bilaterally. No wheeze GI: Soft, nontender, non-distended, no masses MS: Right groin with no hematoma and no bruit Neuro:  Nonfocal, grossly intact   Labs    Chemistry Recent Labs Lab 02/06/17 0200 02/06/17 2000  02/08/17 0015 02/09/17 0302 02/10/17 0730 02/11/17 0950  NA 135 135  < > 132* 133* 132* 131*  K 4.4 4.4  < > 4.7 4.1 4.2 4.5  CL 100* 96*  < > 98* 98* 96* 96*  CO2 22 19*  < > 20* 23 27 27   GLUCOSE 198* 236*  < > 189* 176* 141* 152*  BUN 53* 65*  < > 81* 42* 24* 45*  CREATININE 7.90* 8.81*  < > 9.55* 6.52* 4.80* 6.62*  CALCIUM 8.4* 9.2  < > 7.5* 8.3* 8.5* 9.3  PROT 6.6 8.2*  --   --   --   --   --  ALBUMIN 3.0* 3.7  --  2.7*  --  2.7* 2.7*  AST 18 33  --   --   --   --   --   ALT 12* 17  --   --   --   --   --   ALKPHOS 46 66  --   --   --   --   --   BILITOT 1.0 1.2  --   --   --   --   --   GFRNONAA 5* 4*  < > 4* 6* 9* 6*  GFRAA 6* 5*  < > 5* 7* 11* 7*  ANIONGAP 13 20*  < > 14 12 9 8   < > = values in this interval not displayed.   Hematology  Recent Labs Lab 02/08/17 0015 02/09/17 0302 02/11/17 0900  WBC 4.8 6.6 5.4  RBC 3.25* 3.61* 3.38*  HGB 9.7* 10.7* 10.1*  HCT 30.8* 34.7* 32.3*  MCV 94.8 96.1 95.6  MCH 29.8 29.6 29.9  MCHC 31.5 30.8 31.3  RDW 17.0* 16.7* 16.6*  PLT 129* 148* 135*     Cardiac Studies   Nuclear stress test 02/2017 Study Result     Horizontal ST segment depression ST segment depression was noted during stress in the II, aVF, V5, V6 and III leads.  Findings consistent with ischemia.  The left ventricular ejection fraction is severely decreased (<30%).  This is a high risk study.   TID 1.29 Large area of inferior lateral ischemia from apex to base Diffuse hypokinesis EF 29%      Patient Profile     61 y.o. female history of ASCAD s/p remote CABG in 2012 with no ischemic workup since then.  She also has HTN, hyperlipidemia, ESRD on HD and PVD needing surgical resection of a bladder  mass  Assessment & Plan    1.   Preoperative evaluation to rule out surgical contraindication - Nuclear study abnormal but cath as outlined (plan medical therapy with ASA, metoprolol and statin); echo shows moderate AS; pt may proceed with urological surgery.  2.  ASCAD s/p remote CABG - continue ASA and statin.  3.  ESRD on hemodialysis ([place]) - per nephrology.  4.  Bladder mass - for procedure in 1-2 weeks; per urology  5.  HTN - BP controlled; continue present meds  Please call with questions.  For questions or updates, please contact [place] Please consult [email or web address] for contact info under Cardiology/STEMI.      Signed, Kirk Ruths, MD  02/12/2017, 1:30 PM

## 2017-02-12 NOTE — Progress Notes (Signed)
Family Medicine Teaching Service Daily Progress Note Intern Pager: [phone]  Patient name: Brandi Arellano Medical record number: [id number] Date of birth: 1955-10-14 Age: 61 y.o. Gender: female  Primary Care Provider: Kerin Perna, NP Consultants: nephrology, vascular surgery, urology, psychiatry, cardiology Code Status: partial code (DNI, thinking about DNR)  [name]:  10/28 admitted to fpts  10/29 seen by vvs, permcath replaced, dialysis received, seen by urology 10/30 evaluated by psych 10/31 dialysis 11/01 myoview with ischemic findings 11/02 HD, cardiac catheterization  Assessment and Plan: Cardiac ischemia Underwent cardiac cath, no PCI targets. Medical management.  - cardiology recommendations appreciated - vital signs per floor routine - cardiac monitoring  Altered Mental Status Has resolved and patient is now aox4.   vital signs per floor routine - cardiac monitoring - tylenol for pain, fever  ESRD -Pt had severed catheter tip s/p replacement on 10/29. Had HD on 11/2. See social below. Appreciate nephrology recs, creatinine 9.5 10/30. k 4.7.  - dialysis per nephrology  Abdominal pain, Resolved.  Most likely due to neuropathic pain in legs after improvement on gabapentin. - zofran for nausea - gabapentin for neuropathic pain  Nausea and vomiting Resolved. Tolerating po. -zofran and reglan as above  Shortness of breath Resolved. Weaned down to room air while in room. No shortness of breath endorsed. Will continue to follow. Likely volume overload from dialysis. - dialysis per nephro recs - wean o2 as tolerated  Bladder Mass Patient with a bladder mass in the left posterior base. Potentially the nidus for chronic uti. Large amount of hgb on last few UAs. Urology saw on 10/29.  Receiving myoview due to mulitple cardiac risk factors, pending normal, Will perform cystoscopy as outpt at [facility] in 1-2  weeks.  Potential suicide attempt Patient's family concerned that his may have been a suicide attempt. Patient denies any thoughts or plans of self-harm or harming others. Evaluated by psych who felt that patient was not at risk for suicide. Will follow up outpatient with psych. 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 Has had recurrent UTI. S/p treatment with vantin. Patient with foul smelling urine. No growth on UA, so cefepime stopped. Continue foley until outpatient follow for procedure with urology.  H/O UTI As above  History of CVA   Patient with right thalamic infarct appreciated on head ct. Has no residual deficits. Already on high intensity statin and aspirin as outpt. - continue atorvastatin, asa 81  Type II diabetes CBGs: 104-224 in past 24 hrs. on sSSI while inpatinet. A1c 6.8 on 10/29. - sSSI - q 4 hour checks  Hyperlipidemia Patient currently on atorvastatin. No lipid panel in system. Continue atorvastatin. Will need lipid panel drawn as outpatient by pcp. - continue atorvastatin - lipid panel at pcp follow up  Hypertension Patient with norvasc 5mg  daily at home. Current pressure 133/63. Hr 74. - continue home norvasc - prn labetolol or hydralazine for systolic>170  Hypothyroidism Continue home synthroid 73mcg daily  Right Above Knee Amputation Patient with no skin breakdown at stump site. Has a nervous tic where she shakes this stump apparently due to phantom limb pain. Will monitor for any s/s developing ulcer.   Left transmetatarsal amputation Stump site without any abnormalities. C/D/i with no skin breakdown.  Diabetic Gastroparesis Will likely need upper GI study as outpatient. Could potentially benefit from vagal nerve stimulator. If abdominal pain does not resolve can consider  upper gi while here.  Dispo: Medically stable for discharge, awaiting SNF  placement  FEN/GI: renal/carb modified, miralax PPx: heparin   Subjective: Feels fine. Complains of mild back pain, but she has had this for a long time. No other complaints.   Objective: Temp:  [97.4 F (36.3 C)-98.9 F (37.2 C)] 98.7 F (37.1 C) (11/03 0956) Pulse Rate:  [0-70] 52 (11/03 0956) Resp:  [0-22] 17 (11/03 0956) BP: (100-178)/(49-94) 134/74 (11/03 0956) SpO2:  [0 %-99 %] 97 % (11/03 0956) Weight:  [119 lb 0.8 oz (54 kg)-122 lb (55.3 kg)] 122 lb (55.3 kg) (11/03 0444) Physical Exam: General: NAD, well appearing CVS: RRR, II/VI murmur Lungs: CTAB, no increased work of breathing Abdomen: Soft, nontender, nondistended MSK: No lower extremity edema, right AKA Laboratory:  Recent Labs Lab 02/08/17 0015 02/09/17 0302 02/11/17 0900  WBC 4.8 6.6 5.4  HGB 9.7* 10.7* 10.1*  HCT 30.8* 34.7* 32.3*  PLT 129* 148* 135*    Recent Labs Lab 02/06/17 0200 02/06/17 2000  02/09/17 0302 02/10/17 0730 02/11/17 0950  NA 135 135  < > 133* 132* 131*  K 4.4 4.4  < > 4.1 4.2 4.5  CL 100* 96*  < > 98* 96* 96*  CO2 22 19*  < > 23 27 27   BUN 53* 65*  < > 42* 24* 45*  CREATININE 7.90* 8.81*  < > 6.52* 4.80* 6.62*  CALCIUM 8.4* 9.2  < > 8.3* 8.5* 9.3  PROT 6.6 8.2*  --   --   --   --   BILITOT 1.0 1.2  --   --   --   --   ALKPHOS 46 66  --   --   --   --   ALT 12* 17  --   --   --   --   AST 18 33  --   --   --   --   GLUCOSE 198* 236*  < > 176* 141* 152*  < > = values in this interval not displayed. Imaging/Diagnostic Tests: CLINICAL DATA:  Status post dialysis catheter placement  EXAM: PORTABLE CHEST 1 VIEW  COMPARISON:  02/06/2017  FINDINGS: Right-sided dialysis catheter is in place with tips in the right atrium. No pneumothorax identified. Mild cardiac enlargement. Bilateral pleural effusions and pulmonary edema are similar to previous exam  IMPRESSION: 1. No change in pulmonary edema and pleural effusions 2. No complications after right-sided dialysis  catheter placement.  Bonnita Hollow, MD 02/12/2017, 10:58 AM PGY-1, [facility] Intern pager: [phone], text pages welcome

## 2017-02-12 NOTE — Progress Notes (Signed)
Patient requests for pain med. For her back.Tylenol given at 22:09 with very little relief. Text paged MD on call. Brandi Arellano, Wonda Cheng, Therapist, sports

## 2017-02-12 NOTE — Progress Notes (Signed)
[facility] Progress Note  Subjective: s/p cardiac cath yesterday, no new intervention. No c/os today, alert and animated. Says going to [facility].   Vitals:   02/11/17 2347 02/12/17 0444 02/12/17 0817 02/12/17 0956  BP: (!) 103/49 (!) 100/53 (!) 143/75 134/74  Pulse: (!) 57 (!) 56 63 (!) 52  Resp: 18 17  17   Temp: (!) 97.4 F (36.3 C) 98.9 F (37.2 C)  98.7 F (37.1 C)  TempSrc: Oral Oral  Oral  SpO2: 96% 96%  97%  Weight:  55.3 kg (122 lb)    Height:        Inpatient medications: . amLODipine  5 mg Oral Daily  . aspirin EC  81 mg Oral BID  . atorvastatin  40 mg Oral QPM  . chlorhexidine  15 mL Mouth Rinse BID  . cyclobenzaprine  5 mg Oral Once  . docusate sodium  100 mg Oral Daily  . doxercalciferol  4 mcg Intravenous Q M,W,F-HD  . folic acid  1 mg Oral Daily  . gabapentin  100 mg Oral BID  . heparin  5,000 Units Subcutaneous Q8H  . insulin aspart  0-9 Units Subcutaneous Q4H  . lanthanum  1,000 mg Oral TID WC  . levothyroxine  25 mcg Oral QAC breakfast  . mouth rinse  15 mL Mouth Rinse q12n4p  . metoprolol tartrate  25 mg Oral BID  . multivitamin with minerals  1 tablet Oral Daily  . sodium chloride flush  3 mL Intravenous Q12H  . sodium chloride flush  3 mL Intravenous Q12H  . thiamine  100 mg Oral Daily   . sodium chloride     sodium chloride, acetaminophen **OR** acetaminophen, sodium chloride flush  Exam: General: WDWN Female NAD using nasal O2 Head: NCAT sclera not icteric MMM Neck: Supple. No JVD CEA scars bilaterally Lungs: Breathing unlabored CTAB  Heart: RRR with S1 S2 3/6 systolic EM Abdomen: soft NT + BS Lower extremities: R AKA, L TMA no LE edema  Neuro: alert, Ox 3 today, much better  Psych:  Flat affect, drowsy  Dialysis Access: R IJ TDC clean exit (new cath just replaced here)  Dialysis: [facility] MWF 4h   56.5kg  2/2.25  Hep 4000   R IJ TDC (new one placed yest 10/29) -hect 4 ug tiw -BMM: Fosrenol 1000mg  2 tabs tid      Impression: 1. Cardiac stress + for ischemia/ hx remote CABG - s/p cardiac cath 11/2 - severe diffuse native coronary artery disease - no good targets for PCI, recommend medical management.  Patent LIMA and both SVG's.  2. UTI/ urinary retention - per primary/ urology;  foley in place 3. Broken TDC - replaced on 10/29, new cath working well.  4. ESRD - MWF HD (cath-dependent, not candidate for perm access due to severe PAD) 5. HTN/ vol - cont amlodipine, 1kg under dry, LVEDP normal  6. Anemia of CKD - Hb 10.1 , no need esa for now 7. 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 DM per primary  9. Bladder mass - urology evaluating 10. SI - psych saw and said no SI, and no need medications  Plan - D/C pending SNF placement. Next HD 11/5  Lynnda Child PA-C [facility] Pager [phone] 02/12/2017,11:36 AM  Pt seen, examined, agree w assess/plan as above with additions as indicated. Stable, now is awaiting SNF placement. Next HD Monday.   [name] MD [employer] pager [phone]  cell [phone] 02/12/2017, 12:23 PM       Recent Labs Lab 02/08/17 0015 02/09/17 0302 02/10/17 0730 02/11/17 0950  NA 132* 133* 132* 131*  K 4.7 4.1 4.2 4.5  CL 98* 98* 96* 96*  CO2 20* 23 27 27   GLUCOSE 189* 176* 141* 152*  BUN 81* 42* 24* 45*  CREATININE 9.55* 6.52* 4.80* 6.62*  CALCIUM 7.5* 8.3* 8.5* 9.3  PHOS 7.6*  --  3.0 3.1    Recent Labs Lab 02/06/17 0200 02/06/17 2000 02/08/17 0015 02/10/17 0730 02/11/17 0950  AST 18 33  --   --   --   ALT 12* 17  --   --   --   ALKPHOS 46 66  --   --   --   BILITOT 1.0 1.2  --   --   --   PROT 6.6 8.2*  --   --   --   ALBUMIN 3.0* 3.7 2.7* 2.7* 2.7*    Recent Labs Lab 02/06/17 2000  02/08/17 0015 02/09/17 0302 02/11/17 0900  WBC 10.3  < > 4.8 6.6 5.4  NEUTROABS 8.9*  --   --   --   --   HGB 12.9  < > 9.7* 10.7* 10.1*  HCT 41.1  < > 30.8* 34.7* 32.3*  MCV 96.3  < > 94.8 96.1 95.6  PLT 163  < > 129*  148* 135*  < > = values in this interval not displayed. Iron/TIBC/Ferritin/ %Sat    Component Value Date/Time   IRON 54 01/29/2014 0720   TIBC 210 (L) 01/29/2014 0720   FERRITIN 726 (H) 02/09/2014 0719   IRONPCTSAT 26 01/29/2014 0720

## 2017-02-12 NOTE — Progress Notes (Signed)
CSW spoke with Helene Kelp from [facility] and was informed that they had not received anything on pt. CSW was inform that pt had been at the facility three years ago but as of now is not there. Helene Kelp and other facilities have been sent over information on pt to review. CSW continues to follow for discharge needs.     Virgie Dad Chazz Philson, MSW, [facility] Clinical Social Worker [phone]

## 2017-02-12 NOTE — Progress Notes (Signed)
Patient c/o leaking foley, however per MD urology wants foley to stay in place until they can assess her

## 2017-02-12 NOTE — NC FL2 (Signed)
[place] LEVEL OF CARE SCREENING TOOL     IDENTIFICATION  Patient Name: Brandi Arellano Birthdate: Dec 18, 1955 Sex: female Admission Date (Current Location): 02/06/2017  [facility] [place] and [place] Number:  Herbalist and Address:  [facility] [place] [address], [place], [place] [address]      Provider Number: [id number]  Attending Physician Name and Address:  Alveda Reasons, MD  Relative Name and Phone Number:       Current Level of Care: Hospital Recommended Level of Care: [place] Prior Approval Number:    Date Approved/Denied:   PASRR Number:   [id number]  Discharge Plan: SNF    Current Diagnoses: Patient Active Problem List   Diagnosis Date Noted  . Generalized abdominal pain   . Stable angina pectoris ([place])   . Abnormal stress test   . Bladder mass 02/09/2017  . Preoperative evaluation to rule out surgical contraindication 02/09/2017  . Preoperative cardiovascular examination   . ESRD on hemodialysis ([place])   . Altered mental status 02/07/2017  . Axillary mass, left 07/01/2016  . Dupuytren's contracture of left hand 05/07/2016  . HLD (hyperlipidemia) 08/04/2015  . Conjunctivitis 08/04/2015  . Insomnia 07/09/2015  . Gangrene associated with diabetes mellitus ([place]) 03/20/2014  . Gastroparesis due to DM ([place]) 02/12/2014  . Status post above knee amputation, right ([place]) 02/08/2014  . Buerger's disease ([place]) 02/08/2014  . Hyperparathyroidism, secondary renal ([place]) 02/06/2014  . Hyperphosphatemia 02/06/2014  . Anemia in chronic kidney disease 02/06/2014  . 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 Essential hypertension 12/26/2013  . DM type 2, uncontrolled, with renal complications ([place]) 32/95/1884  . CKD (chronic kidney disease) 12/26/2013  . Hypothyroidism 12/26/2013  . CAD (coronary artery disease) 12/26/2013  . End stage renal disease ([place]) 12/14/2013     Orientation RESPIRATION BLADDER Height & Weight     Self, Time, Situation, Place  Normal Continent Weight: 122 lb (55.3 kg) Height:  5\' 6"  (167.6 cm)  BEHAVIORAL SYMPTOMS/MOOD NEUROLOGICAL BOWEL NUTRITION STATUS      Continent Diet (please see discharge summary. )  AMBULATORY STATUS COMMUNICATION OF NEEDS Skin   Extensive Assist Verbally Surgical wounds (on chest)                       Personal Care Assistance Level of Assistance  Bathing, Dressing Bathing Assistance: Maximum assistance Feeding assistance: Limited assistance Dressing Assistance: Maximum assistance     Functional Limitations Info  Sight, Hearing, Speech Sight Info: Adequate Hearing Info: Adequate Speech Info: Adequate    SPECIAL CARE FACTORS FREQUENCY  PT (By licensed PT), OT (By licensed OT)     PT Frequency: 5 times a week  OT Frequency: 5 times a week             Contractures Contractures Info: Not present    Additional Factors Info  Code Status, Allergies Code Status Info: None on file  Allergies Info: Diphenhydramine, Tramadol Hcl, Morphine And Related           Current Medications (02/12/2017):  This is the current hospital active medication list Current Facility-Administered Medications  Medication Dose Route Frequency Provider Last Rate Last Dose  . 0.9 %  sodium chloride infusion  250 mL Intravenous PRN End, Harrell Gave, MD      . acetaminophen (TYLENOL) tablet 650 mg  650 mg Oral Q6H PRN Sela Hilding, MD   650 mg at 02/11/17 2209  Or  . acetaminophen (TYLENOL) suppository 650 mg  650 mg Rectal Q6H PRN Sela Hilding, MD      . amLODipine (NORVASC) tablet 5 mg  5 mg Oral Daily Sela Hilding, MD   5 mg at 02/12/17 4580  . aspirin EC tablet 81 mg  81 mg Oral BID Sela Hilding, MD   81 mg at 02/12/17 9983  . atorvastatin (LIPITOR) tablet 40 mg  40 mg Oral QPM Sela Hilding, MD   40 mg at 02/11/17 2159  . chlorhexidine (PERIDEX) 0.12 % solution 15 mL   15 mL Mouth Rinse BID Leeanne Rio, MD   15 mL at 02/12/17 0816  . cyclobenzaprine (FLEXERIL) tablet 5 mg  5 mg Oral Once Rogue Bussing, MD      . docusate sodium (COLACE) capsule 100 mg  100 mg Oral Daily Guadalupe Dawn, MD   100 mg at 02/12/17 0817  . doxercalciferol (HECTOROL) injection 4 mcg  4 mcg Intravenous Q M,W,F-HD Lynnda Child, PA-C   4 mcg at 02/11/17 3825  . folic acid (FOLVITE) tablet 1 mg  1 mg Oral Daily Sela Hilding, MD   1 mg at 02/12/17 0820  . gabapentin (NEURONTIN) capsule 100 mg  100 mg Oral BID Wendee Beavers T, MD   100 mg at 02/12/17 0821  . heparin injection 5,000 Units  5,000 Units Subcutaneous Q8H End, Christopher, MD   5,000 Units at 02/12/17 1304  . insulin aspart (novoLOG) injection 0-9 Units  0-9 Units Subcutaneous Q4H Guadalupe Dawn, MD   7 Units at 02/12/17 1301  . lanthanum (FOSRENOL) chewable tablet 1,000 mg  1,000 mg Oral TID WC Lynnda Child, PA-C   1,000 mg at 02/12/17 1300  . levothyroxine (SYNTHROID, LEVOTHROID) tablet 25 mcg  25 mcg Oral QAC breakfast Sela Hilding, MD   25 mcg at 02/12/17 [phone]  . MEDLINE mouth rinse  15 mL Mouth Rinse q12n4p Leeanne Rio, MD   15 mL at 02/11/17 1600  . metoprolol tartrate (LOPRESSOR) tablet 25 mg  25 mg Oral BID Sueanne Margarita, MD   25 mg at 02/12/17 7673  . multivitamin with minerals tablet 1 tablet  1 tablet Oral Daily Sela Hilding, MD   1 tablet at 02/12/17 0817  . sodium chloride flush (NS) 0.9 % injection 3 mL  3 mL Intravenous Q12H Sela Hilding, MD   3 mL at 02/11/17 2202  . sodium chloride flush (NS) 0.9 % injection 3 mL  3 mL Intravenous Q12H End, Christopher, MD   3 mL at 02/11/17 2202  . sodium chloride flush (NS) 0.9 % injection 3 mL  3 mL Intravenous PRN End, Christopher, MD      . thiamine (VITAMIN B-1) tablet 100 mg  100 mg Oral Daily Sela Hilding, MD   100 mg at 02/12/17 4193   Facility-Administered Medications Ordered in Other  Encounters  Medication Dose Route Frequency Provider Last Rate Last Dose  . 0.9 %  sodium chloride infusion    Continuous PRN Laretta Alstrom, CRNA         Discharge Medications: Please see discharge summary for a list of discharge medications.  Relevant Imaging Results:  Relevant Lab Results:   Additional Information [id number]  Wetzel Bjornstad, LCSWA

## 2017-02-13 ENCOUNTER — Other Ambulatory Visit: Payer: Self-pay

## 2017-02-13 DIAGNOSIS — R112 Nausea with vomiting, unspecified: Secondary | ICD-10-CM | POA: Diagnosis present

## 2017-02-13 LAB — GLUCOSE, CAPILLARY
Glucose-Capillary: 132 mg/dL — ABNORMAL HIGH (ref 65–99)
Glucose-Capillary: 135 mg/dL — ABNORMAL HIGH (ref 65–99)
Glucose-Capillary: 152 mg/dL — ABNORMAL HIGH (ref 65–99)
Glucose-Capillary: 173 mg/dL — ABNORMAL HIGH (ref 65–99)
Glucose-Capillary: 188 mg/dL — ABNORMAL HIGH (ref 65–99)
Glucose-Capillary: 192 mg/dL — ABNORMAL HIGH (ref 65–99)

## 2017-02-13 MED ORDER — CYCLOBENZAPRINE HCL 5 MG PO TABS
5.0000 mg | ORAL_TABLET | Freq: Once | ORAL | Status: AC
  Administered 2017-02-13: 5 mg via ORAL
  Filled 2017-02-13: qty 1

## 2017-02-13 MED ORDER — SENNOSIDES-DOCUSATE SODIUM 8.6-50 MG PO TABS
1.0000 | ORAL_TABLET | Freq: Once | ORAL | Status: AC
Start: 2017-02-13 — End: 2017-02-13
  Administered 2017-02-13: 1 via ORAL
  Filled 2017-02-13: qty 1

## 2017-02-13 MED ORDER — SENNOSIDES-DOCUSATE SODIUM 8.6-50 MG PO TABS
1.0000 | ORAL_TABLET | Freq: Every evening | ORAL | Status: DC | PRN
  Filled 2017-02-13: qty 1

## 2017-02-13 NOTE — Progress Notes (Signed)
Family Medicine Teaching Service Daily Progress Note Intern Pager: [phone]  Patient name: Brandi Arellano Medical record number: [id number] Date of birth: 30-Mar-1956 Age: 61 y.o. Gender: female  Primary Care Provider: Kerin Perna, NP Consultants: nephrology, vascular surgery, urology, psychiatry, cardiology Code Status: partial code (DNI, thinking about DNR)  [name]:  10/28 admitted to fpts  10/29 seen by vvs, permcath replaced, dialysis received, seen by urology 10/30 evaluated by psych 10/31 dialysis 11/01 myoview with ischemic findings 11/02 HD, cardiac catheterization  Assessment and Plan: Cardiac ischemia Underwent cardiac cath, no PCI targets. Medical management.  - cardiology recommendations appreciated - vital signs per floor routine - cardiac monitoring  Altered Mental Status Has resolved and patient is now aox4.   vital signs per floor routine - cardiac monitoring - tylenol for pain, fever  ESRD -Pt had severed catheter tip s/p replacement on 10/29. Had HD on 11/2. See social below. Appreciate nephrology recs, creatinine 9.5 10/30. k 4.7.  - dialysis per nephrology  Abdominal pain, Resolved.  Most likely due to neuropathic pain in legs after improvement on gabapentin. - zofran for nausea - gabapentin for neuropathic pain  Nausea and vomiting Resolved. Tolerating po. -zofran and reglan as above  Shortness of breath Resolved. Weaned down to room air while in room. No shortness of breath endorsed. Will continue to follow. Likely volume overload from dialysis. - dialysis per nephro recs - wean o2 as tolerated  Bladder Mass Patient with a bladder mass in the left posterior base. Potentially the nidus for chronic uti. Large amount of hgb on last few UAs. Urology saw on 10/29.  Receiving myoview due to mulitple cardiac risk factors, pending normal, Will perform cystoscopy as outpt at [facility] in 1-2  weeks.  Potential suicide attempt Patient's family concerned that his may have been a suicide attempt. Patient denies any thoughts or plans of self-harm or harming others. Evaluated by psych who felt that patient was not at risk for suicide. Will follow up outpatient with psych. 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 Has had recurrent UTI. S/p treatment with vantin. Patient with foul smelling urine. No growth on UA, so cefepime stopped. Continue foley until outpatient follow for procedure with urology.  H/O UTI As above  History of CVA   Patient with right thalamic infarct appreciated on head ct. Has no residual deficits. Already on high intensity statin and aspirin as outpt. - continue atorvastatin, asa 81  Type II diabetes CBGs: 104-224 in past 24 hrs. on sSSI while inpatinet. A1c 6.8 on 10/29. - sSSI - q 4 hour checks  Hyperlipidemia Patient currently on atorvastatin. No lipid panel in system. Continue atorvastatin. Will need lipid panel drawn as outpatient by pcp. - continue atorvastatin - lipid panel at pcp follow up  Hypertension Patient with norvasc 5mg  daily at home. Current pressure 133/63. Hr 74. - continue home norvasc - prn labetolol or hydralazine for systolic>170  Hypothyroidism Continue home synthroid 97mcg daily  Right Above Knee Amputation Patient with no skin breakdown at stump site. Has a nervous tic where she shakes this stump apparently due to phantom limb pain. Will monitor for any s/s developing ulcer.   Left transmetatarsal amputation Stump site without any abnormalities. C/D/i with no skin breakdown.  Diabetic Gastroparesis Will likely need upper GI study as outpatient. Could potentially benefit from vagal nerve stimulator. If abdominal pain does not resolve can consider  upper gi while here.  Dispo: Medically stable for discharge, awaiting SNF  placement  FEN/GI: renal/carb modified, miralax PPx: heparin   Subjective: other than mild back discomfort patient says they are doing well and still wanted d/c to snf.   They mentioned wanting to know if they could be brought by their house first to get clothes and I told them I didn't know of a way to do that and she said she would try to get family/friends to do it.  Told her I would give flexaril 5mg  for back pain  Objective: Temp:  [98.4 F (36.9 C)-98.9 F (37.2 C)] 98.6 F (37 C) (11/03 2110) Pulse Rate:  [52-66] 66 (11/03 2110) Resp:  [17-18] 18 (11/03 2110) BP: (96-143)/(51-79) 98/76 (11/04 0009) SpO2:  [96 %-98 %] 97 % (11/03 2110) Weight:  [122 lb (55.3 kg)-126 lb 12.2 oz (57.5 kg)] 126 lb 12.2 oz (57.5 kg) (11/03 2110) Physical Exam: General: NAD, well appearing CVS: RRR,  murmur Lungs: CTAB, no increased work of breathing Abdomen: Soft, nontender, nondistended MSK: No lower extremity edema, right AKA Laboratory: Recent Labs  Lab 02/08/17 0015 02/09/17 0302 02/11/17 0900  WBC 4.8 6.6 5.4  HGB 9.7* 10.7* 10.1*  HCT 30.8* 34.7* 32.3*  PLT 129* 148* 135*   Recent Labs  Lab 02/06/17 2000  02/09/17 0302 02/10/17 0730 02/11/17 0950  NA 135   < > 133* 132* 131*  K 4.4   < > 4.1 4.2 4.5  CL 96*   < > 98* 96* 96*  CO2 19*   < > 23 27 27   BUN 65*   < > 42* 24* 45*  CREATININE 8.81*   < > 6.52* 4.80* 6.62*  CALCIUM 9.2   < > 8.3* 8.5* 9.3  PROT 8.2*  --   --   --   --   BILITOT 1.2  --   --   --   --   ALKPHOS 66  --   --   --   --   ALT 17  --   --   --   --   AST 33  --   --   --   --   GLUCOSE 236*   < > 176* 141* 152*   < > = values in this interval not displayed.   Imaging/Diagnostic Tests: CLINICAL DATA:  Status post dialysis catheter placement  EXAM: PORTABLE CHEST 1 VIEW  COMPARISON:  02/06/2017  FINDINGS: Right-sided dialysis catheter is in place with tips in the right atrium. No pneumothorax identified. Mild cardiac  enlargement. Bilateral pleural effusions and pulmonary edema are similar to previous exam  IMPRESSION: 1. No change in pulmonary edema and pleural effusions 2. No complications after right-sided dialysis catheter placement.  Sherene Sires, DO 02/13/2017, 4:14 AM PGY-1, [place] Intern pager: [phone], text pages welcome

## 2017-02-13 NOTE — Progress Notes (Signed)
[facility] Progress Note  Subjective: Says going to [facility], "waiting for a bed".  No c/o, no CP or SOB.    Vitals:   02/12/17 1815 02/12/17 2110 02/13/17 0009 02/13/17 0523  BP: 137/79 (!) 96/51 98/76 (!) 128/51  Pulse: (!) 54 66  62  Resp: 17 18  14   Temp: 98.4 F (36.9 C) 98.6 F (37 C)  98.6 F (37 C)  TempSrc: Oral Oral  Oral  SpO2: 98% 97%  96%  Weight:  57.5 kg (126 lb 12.2 oz)    Height:        Inpatient medications: . amLODipine  5 mg Oral Daily  . aspirin EC  81 mg Oral BID  . atorvastatin  40 mg Oral QPM  . chlorhexidine  15 mL Mouth Rinse BID  . cyclobenzaprine  5 mg Oral Once  . docusate sodium  100 mg Oral Daily  . doxercalciferol  4 mcg Intravenous Q M,W,F-HD  . folic acid  1 mg Oral Daily  . gabapentin  100 mg Oral BID  . heparin  5,000 Units Subcutaneous Q8H  . insulin aspart  0-9 Units Subcutaneous Q4H  . lanthanum  1,000 mg Oral TID WC  . levothyroxine  25 mcg Oral QAC breakfast  . mouth rinse  15 mL Mouth Rinse q12n4p  . metoprolol tartrate  25 mg Oral BID  . multivitamin with minerals  1 tablet Oral Daily  . sodium chloride flush  3 mL Intravenous Q12H  . sodium chloride flush  3 mL Intravenous Q12H  . thiamine  100 mg Oral Daily   . sodium chloride     sodium chloride, acetaminophen **OR** acetaminophen, sodium chloride flush  Exam: General: WDWN Female NAD Lungs: Breathing unlabored CTAB  Heart: RRR with S1 S2 3/6 systolic EM Abdomen: soft NT + BS Lower extremities: R AKA, L TMA no LE edema  Neuro: alert, Ox 3 today, much better  Psych:  Flat affect, drowsy  Dialysis Access: R IJ TDC clean exit (new cath just replaced here)  Dialysis: [facility] MWF 4h   56.5kg  2/2.25  Hep 4000   R IJ TDC (new one placed yest 10/29) -hect 4 ug tiw -BMM: Fosrenol 1000mg  2 tabs tid     Impression: 1. Cardiac stress + for ischemia/ hx remote CABG - s/p cardiac cath 11/2 - severe diffuse native coronary artery disease and patent bypass  grafts x 3 .  No intervention needed.  2. Bladder mass w voiding difficulty/ chronic indwelling foley - per urology needs TURBT and bilat retrogrades, will be done in OP setting in 1-2 weeks.  Urine cx was negative, abx stopped.  3. AMS - on admit, resolved, due to pain meds 4. Abd pain / N/V - resolved 5. Broken TDC - replaced on 10/29, new cath working well.  6. ESRD - MWF HD. Cath-dependent, not candidate for perm access due to severe PAD.  7. HTN/ vol - cont amlodipine, at dry wt, LVEDP normal at cath 8. 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 DM per primary  11. Bladder mass - urology evaluating 12. SI - seen by psychiatry, no active SI , does not need any medications  Plan - D/C pending SNF placement. Next HD tomorrow.    [name] MD [employer] pgr [phone]   02/13/2017, 6:53 AM          Recent  Labs  Lab 02/08/17 0015 02/09/17 0302 02/10/17 0730 02/11/17 0950  NA 132* 133* 132* 131*  K 4.7 4.1 4.2 4.5  CL 98* 98* 96* 96*  CO2 20* 23 27 27   GLUCOSE 189* 176* 141* 152*  BUN 81* 42* 24* 45*  CREATININE 9.55* 6.52* 4.80* 6.62*  CALCIUM 7.5* 8.3* 8.5* 9.3  PHOS 7.6*  --  3.0 3.1   Recent Labs  Lab 02/06/17 2000 02/08/17 0015 02/10/17 0730 02/11/17 0950  AST 33  --   --   --   ALT 17  --   --   --   ALKPHOS 66  --   --   --   BILITOT 1.2  --   --   --   PROT 8.2*  --   --   --   ALBUMIN 3.7 2.7* 2.7* 2.7*   Recent Labs  Lab 02/06/17 2000  02/08/17 0015 02/09/17 0302 02/11/17 0900  WBC 10.3   < > 4.8 6.6 5.4  NEUTROABS 8.9*  --   --   --   --   HGB 12.9   < > 9.7* 10.7* 10.1*  HCT 41.1   < > 30.8* 34.7* 32.3*  MCV 96.3   < > 94.8 96.1 95.6  PLT 163   < > 129* 148* 135*   < > = values in this interval not displayed.   Iron/TIBC/Ferritin/ %Sat    Component Value Date/Time   IRON 54 01/29/2014 0720   TIBC 210 (L) 01/29/2014 0720   FERRITIN 726 (H) 02/09/2014 0719   IRONPCTSAT  26 01/29/2014 0720

## 2017-02-14 ENCOUNTER — Encounter (HOSPITAL_COMMUNITY): Payer: Self-pay | Admitting: Internal Medicine

## 2017-02-14 DIAGNOSIS — Z95828 Presence of other vascular implants and grafts: Secondary | ICD-10-CM

## 2017-02-14 LAB — RENAL FUNCTION PANEL
Albumin: 2.7 g/dL — ABNORMAL LOW (ref 3.5–5.0)
Anion gap: 10 (ref 5–15)
BUN: 60 mg/dL — ABNORMAL HIGH (ref 6–20)
CALCIUM: 9.6 mg/dL (ref 8.9–10.3)
CO2: 26 mmol/L (ref 22–32)
CREATININE: 7.6 mg/dL — AB (ref 0.44–1.00)
Chloride: 92 mmol/L — ABNORMAL LOW (ref 101–111)
GFR, EST AFRICAN AMERICAN: 6 mL/min — AB (ref >60)
GFR, EST NON AFRICAN AMERICAN: 5 mL/min — AB (ref >60)
Glucose, Bld: 135 mg/dL — ABNORMAL HIGH (ref 65–99)
Phosphorus: 3 mg/dL (ref 2.5–4.6)
Potassium: 4.8 mmol/L (ref 3.5–5.1)
SODIUM: 128 mmol/L — AB (ref 135–145)

## 2017-02-14 LAB — CBC
HCT: 28.8 % — ABNORMAL LOW (ref 36.0–46.0)
Hemoglobin: 9 g/dL — ABNORMAL LOW (ref 12.0–15.0)
MCH: 29.8 pg (ref 26.0–34.0)
MCHC: 31.3 g/dL (ref 30.0–36.0)
MCV: 95.4 fL (ref 78.0–100.0)
PLATELETS: 115 10*3/uL — AB (ref 150–400)
RBC: 3.02 MIL/uL — AB (ref 3.87–5.11)
RDW: 17.1 % — ABNORMAL HIGH (ref 11.5–15.5)
WBC: 5 10*3/uL (ref 4.0–10.5)

## 2017-02-14 LAB — HEPATITIS B SURFACE ANTIGEN: HEP B S AG: NEGATIVE

## 2017-02-14 LAB — GLUCOSE, CAPILLARY
GLUCOSE-CAPILLARY: 162 mg/dL — AB (ref 65–99)
GLUCOSE-CAPILLARY: 109 mg/dL — AB (ref 65–99)
Glucose-Capillary: 124 mg/dL — ABNORMAL HIGH (ref 65–99)
Glucose-Capillary: 143 mg/dL — ABNORMAL HIGH (ref 65–99)
Glucose-Capillary: 152 mg/dL — ABNORMAL HIGH (ref 65–99)
Glucose-Capillary: 168 mg/dL — ABNORMAL HIGH (ref 65–99)

## 2017-02-14 MED ORDER — DOXERCALCIFEROL 4 MCG/2ML IV SOLN
INTRAVENOUS | Status: AC
  Filled 2017-02-14: qty 2

## 2017-02-14 MED ORDER — SORBITOL 70 % SOLN
960.0000 mL | TOPICAL_OIL | Freq: Once | ORAL | Status: AC
  Administered 2017-02-14: 960 mL via RECTAL
  Filled 2017-02-14: qty 473

## 2017-02-14 NOTE — Progress Notes (Signed)
Patient ID: Brandi Arellano, female   DOB: 1955/05/29, 61 y.o.   MRN: [id number]   [facility] Progress Note   Assessment/ Plan:   1. Cardiac stress + for ischemia/ hx remote CABG - s/p cardiac cath 11/2 - severe diffuse native coronary artery disease and patent bypass grafts x 3 .  No intervention required at this point and will continue .  2. Bladder mass with voiding difficulty/ chronic indwelling foley - per urology will undergo TURBT and percutaneous nephrostomies at some point as an out-patient.  3. AMS - resolved, suspected to be iatrogenic due to pain meds 4. ESRD - MWF HD. Catheter-dependent, not a candidate for perm access due to severe PAD.  5. HTN/ volume - Blood pressures well controlled, continue amlodipine 6. Anemia of CKD - Hb 9.0, no overt losses, begin ESA 7. MBD of CKD - continue VDRA/ phosphorus at goal on current binder dose 8. DM per primary  9. Suicidal Ideation - seen by psychiatry, no active SI , does not need any medications  Subjective:   Reports to be feeling better and without any further chest pain or shortness of breath. Awaiting placement to SNF (previously lived at her brother's house)   Objective:   BP (!) 147/70   Pulse (!) 52   Temp 98.3 F (36.8 C) (Oral)   Resp 18   Ht 5\' 6"  (1.676 m)   Wt 59.2 kg (130 lb 8.2 oz)   SpO2 90%   BMI 21.07 kg/m   Physical Exam: VPX:[id number] resting in dialysis EVO:JJKKX regular rhythm and normal rate Resp:Clear to auscultation and no rales/rhonchi audible. RIJ TDC FGH:WEXH, flat, NT, bowel sounds normal Ext: s/p right AKA and left TMA, no edema  Labs: BMET Recent Labs  Lab 02/08/17 0015 02/09/17 0302 02/10/17 0730 02/11/17 0950 02/14/17 0730  NA 132* 133* 132* 131* 128*  K 4.7 4.1 4.2 4.5 4.8  CL 98* 98* 96* 96* 92*  CO2 20* 23 27 27 26   GLUCOSE 189* 176* 141* 152* 135*  BUN 81* 42* 24* 45* 60*  CREATININE 9.55* 6.52* 4.80* 6.62* 7.60*  CALCIUM 7.5* 8.3* 8.5* 9.3 9.6  PHOS 7.6*   --  3.0 3.1 3.0   CBC Recent Labs  Lab 02/08/17 0015 02/09/17 0302 02/11/17 0900 02/14/17 0730  WBC 4.8 6.6 5.4 5.0  HGB 9.7* 10.7* 10.1* 9.0*  HCT 30.8* 34.7* 32.3* 28.8*  MCV 94.8 96.1 95.6 95.4  PLT 129* 148* 135* 115*   Medications:    . amLODipine  5 mg Oral Daily  . aspirin EC  81 mg Oral BID  . atorvastatin  40 mg Oral QPM  . chlorhexidine  15 mL Mouth Rinse BID  . docusate sodium  100 mg Oral Daily  . doxercalciferol  4 mcg Intravenous Q M,W,F-HD  . folic acid  1 mg Oral Daily  . gabapentin  100 mg Oral BID  . heparin  5,000 Units Subcutaneous Q8H  . insulin aspart  0-9 Units Subcutaneous Q4H  . lanthanum  1,000 mg Oral TID WC  . levothyroxine  25 mcg Oral QAC breakfast  . mouth rinse  15 mL Mouth Rinse q12n4p  . metoprolol tartrate  25 mg Oral BID  . multivitamin with minerals  1 tablet Oral Daily  . sodium chloride flush  3 mL Intravenous Q12H  . thiamine  100 mg Oral Daily   Elmarie Shiley, MD 02/14/2017, 9:18 AM

## 2017-02-14 NOTE — Progress Notes (Signed)
Brief Nutrition Note:   RD received phone call from RN requesting diet ed/diet reinforcement.   Pt appears to be familiar with dietary restrictions but is also a very picky eater and also has poor dentition making some items on hospital menu too difficult to chew (pt does not eat any fruits other than applesauce, does not eat oatmeal, grits or rice, does not like our scrambled eggs or boiled eggs, chicken is too tough, does not eat most fish, etc.). Reviewed hospital menu with pt and assisted pt with ordering dinner tonight and breakfast in AM.   Explained why diet restrictions are needed and provided lists of foods to limit/avoid that are high potassium, sodium, and phosphorus. Provided specific recommendations on safer alternatives of these foods. Strongly encouraged compliance of this diet.   Discussed importance of protein intake at each meal and snack. Provided examples of how to maximize protein intake throughout the day. Discussed need for fluid restriction with dialysis, importance of minimizing weight gain between HD treatments, and renal-friendly beverage options.  Encouraged pt to discuss specific diet questions/concerns with RD at HD outpatient facility. Teach back method used.  Expect good compliance.  Body mass index is 20.25 kg/m.   Current diet order is Renal/Carb Modified, patient is consuming approximately 100% of meals at this time. Labs and medications reviewed. Weight appears relatively stable since last assessed by an inpatient RD in 2015. No further nutrition interventions warranted at this time. RD contact information provided. If additional nutrition issues arise, please re-consult RD.  Kerman Passey MS, RD, [place], [place] [phone] Pager  [phone] Weekend/On-Call Pager

## 2017-02-14 NOTE — Progress Notes (Signed)
Family Medicine Teaching Service Daily Progress Note Intern Pager: [phone]  Patient name: Brandi Arellano Medical record number: [id number] Date of birth: 12-16-1955 Age: 61 y.o. Gender: female  Primary Care Provider: Kerin Perna, NP Consultants: nephrology, vascular surgery, urology, psychiatry, cardiology Code Status: partial code (DNI, thinking about DNR)  [name]:  10/28 admitted to [facility]  10/29 seen by vvs, permcath replaced, dialysis received, seen by urology 10/30 evaluated by psych 10/31 dialysis 11/01 myoview with ischemic findings 11/02 HD, cardiac catheterization 11/05 HD  Assessment and Plan: Cardiac ischemia Underwent cardiac cath, no PCI targets. Medical management.  - cardiology recommendations appreciated - vital signs per floor routine - cardiac monitoring  Altered Mental Status Has resolved and patient is now aox4.   vital signs per floor routine - cardiac monitoring - tylenol for pain, fever  ESRD -Pt had severed catheter tip s/p replacement on 10/29. Had HD on 11/2. See social below. Appreciate nephrology recs, creatinine 9.5 10/30. k 4.7.  - dialysis per nephrology  Abdominal pain, Resolved.  Most likely due to neuropathic pain in legs after improvement on gabapentin. - zofran for nausea - gabapentin for neuropathic pain  Nausea and vomiting Resolved. Tolerating po. -zofran and reglan as above  Shortness of breath Resolved. Weaned down to room air while in room. No shortness of breath endorsed. Will continue to follow. Likely volume overload from dialysis. - dialysis per nephro recs   Bladder Mass Patient with a bladder mass in the left posterior base. Potentially the nidus for chronic uti. Large amount of hgb on last few UAs. Urology saw on 10/29.  Receiving myoview due to mulitple cardiac risk factors, pending normal, Will perform cystoscopy as outpt at [facility] in 1-2 weeks.  Potential suicide  attempt Patient's family concerned that his may have been a suicide attempt. Patient denies any thoughts or plans of self-harm or harming others. Evaluated by psych who felt that patient was not at risk for suicide. Will follow up outpatient with psych. 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 Has had recurrent UTI. S/p treatment with vantin. Patient with foul smelling urine. No growth on UA, so cefepime stopped. Continue foley until outpatient follow for procedure with urology.  H/O UTI As above  History of CVA   Patient with right thalamic infarct appreciated on head ct. Has no residual deficits. Already on high intensity statin and aspirin as outpt. - continue atorvastatin, asa 81  Type II diabetes CBGs: 104-224 in past 24 hrs. on sSSI while inpatinet. A1c 6.8 on 10/29. - sSSI - q 4 hour checks  Hyperlipidemia Patient currently on atorvastatin. No lipid panel in system. Continue atorvastatin. Will need lipid panel drawn as outpatient by pcp. - continue atorvastatin - lipid panel at pcp follow up  Hypertension Patient with norvasc 5mg  daily at home. Current pressure 133/63. Hr 74. - continue home norvasc - prn labetolol or hydralazine for systolic>170  Hypothyroidism Continue home synthroid 1mcg daily  Right Above Knee Amputation Patient with no skin breakdown at stump site. Has a nervous tic where she shakes this stump apparently due to phantom limb pain. Will monitor for any s/s developing ulcer.   Left transmetatarsal amputation Stump site without any abnormalities. C/D/i with no skin breakdown.  Diabetic Gastroparesis Will likely need upper GI study as outpatient. Could potentially benefit from vagal nerve stimulator. If abdominal pain does not resolve can consider upper gi  while here.  Dispo: Medically stable for discharge, awaiting SNF placement  FEN/GI: renal/carb modified,  miralax PPx: heparin   Subjective: No complaints today. Currently in HD. Wants to go home.  Objective: Temp:  [98 F (36.7 C)-98.3 F (36.8 C)] 98.1 F (36.7 C) (11/05 1142) Pulse Rate:  [52-65] 60 (11/05 1142) Resp:  [14-18] 18 (11/05 1142) BP: (106-166)/(43-79) 140/64 (11/05 1142) SpO2:  [90 %-98 %] 98 % (11/05 1142) Weight:  [116 lb 13.5 oz (53 kg)-130 lb 8.2 oz (59.2 kg)] 125 lb 7.1 oz (56.9 kg) (11/05 1142) Physical Exam: General: NAD, well appearing CVS: RRR,  murmur Lungs: CTAB, no increased work of breathing Abdomen: Soft, nontender, nondistended MSK: No lower extremity edema, right AKA  Laboratory: Recent Labs  Lab 02/09/17 0302 02/11/17 0900 02/14/17 0730  WBC 6.6 5.4 5.0  HGB 10.7* 10.1* 9.0*  HCT 34.7* 32.3* 28.8*  PLT 148* 135* 115*   Recent Labs  Lab 02/10/17 0730 02/11/17 0950 02/14/17 0730  NA 132* 131* 128*  K 4.2 4.5 4.8  CL 96* 96* 92*  CO2 27 27 26   BUN 24* 45* 60*  CREATININE 4.80* 6.62* 7.60*  CALCIUM 8.5* 9.3 9.6  GLUCOSE 141* 152* 135*   Imaging/Diagnostic Tests: CLINICAL DATA:  Status post dialysis catheter placement  EXAM: PORTABLE CHEST 1 VIEW  COMPARISON:  02/06/2017  FINDINGS: Right-sided dialysis catheter is in place with tips in the right atrium. No pneumothorax identified. Mild cardiac enlargement. Bilateral pleural effusions and pulmonary edema are similar to previous exam  IMPRESSION: 1. No change in pulmonary edema and pleural effusions 2. No complications after right-sided dialysis catheter placement.  Bonnita Hollow, MD 02/14/2017, 12:23 PM PGY-1, [place] Intern pager: [phone], text pages welcome

## 2017-02-14 NOTE — Plan of Care (Signed)
  Progressing Tissue Perfusion: Risk factors for ineffective tissue perfusion will decrease 02/14/2017 1357 - Progressing by Marice Potter, RN Fluid Volume: Ability to maintain a balanced intake and output will improve 02/14/2017 1357 - Progressing by Marice Potter, RN

## 2017-02-14 NOTE — Procedures (Signed)
Patient seen on Hemodialysis. QB 400, UF goal 2.5L Treatment adjusted as needed.  Elmarie Shiley MD [facility]. Office # [phone] Pager # [phone] 9:29 AM

## 2017-02-15 ENCOUNTER — Other Ambulatory Visit: Payer: Self-pay | Admitting: Urology

## 2017-02-15 DIAGNOSIS — Z992 Dependence on renal dialysis: Secondary | ICD-10-CM

## 2017-02-15 DIAGNOSIS — Z95828 Presence of other vascular implants and grafts: Secondary | ICD-10-CM

## 2017-02-15 LAB — GLUCOSE, CAPILLARY
GLUCOSE-CAPILLARY: 68 mg/dL (ref 65–99)
GLUCOSE-CAPILLARY: 255 mg/dL — AB (ref 65–99)
GLUCOSE-CAPILLARY: 102 mg/dL — AB (ref 65–99)
GLUCOSE-CAPILLARY: 120 mg/dL — AB (ref 65–99)
Glucose-Capillary: 101 mg/dL — ABNORMAL HIGH (ref 65–99)
Glucose-Capillary: 156 mg/dL — ABNORMAL HIGH (ref 65–99)
Glucose-Capillary: 59 mg/dL — ABNORMAL LOW (ref 65–99)

## 2017-02-15 LAB — HEPATITIS B E ANTIBODY: Hep B E Ab: NEGATIVE

## 2017-02-15 MED ORDER — DARBEPOETIN ALFA 60 MCG/0.3ML IJ SOSY
60.0000 ug | PREFILLED_SYRINGE | INTRAMUSCULAR | Status: DC
  Administered 2017-02-16: 60 ug via INTRAVENOUS
  Filled 2017-02-15 (×2): qty 0.3

## 2017-02-15 NOTE — Evaluation (Signed)
Occupational Therapy Evaluation Patient Details Name: Brandi Arellano MRN: [id number] DOB: 1955-05-30 Today's Date: 02/15/2017    History of Present Illness Patient is a 61 yo female with complex medical history inclusive of R BKA, DM2, ESRD, CAD, CHF, HTN, left forefoot amputation who presents for AMS, trauma to dialysis catheter rrequiring replacement, and cardiac cath.   Clinical Impression   PTA, pt was independent with ADL and functional mobility at wheelchair level. She reports that she has been completing IADL at home at wheelchair level at home PTA. Pt demonstrating ability to complete ADL and ADL transfers at wheelchair level with modified independence this session. She was able to complete toileting tasks with [facility] in room with modified independence as well. Pt reports that she has a prosthesis that has been made, but she has been unable to obtain transportation to [facility] to pick it up. She additionally reports desire to D/C to SNF due to difficulties at home with her brother. Feel pt may benefit from continued rehabilitation if she were to pursue use of prosthesis; however, pt plans to continue with w/c use. No further OT needs identified at current functional level and OT will sign off.     Follow Up Recommendations  No OT follow up    Equipment Recommendations  None recommended by OT    Recommendations for Other Services       Precautions / Restrictions        Mobility Bed Mobility Overal bed mobility: Independent             General bed mobility comments: OOB in chair on my arrival.   Transfers Overall transfer level: Independent   Transfers: Squat Pivot Transfers     Squat pivot transfers: Modified independent (Device/Increase time)     General transfer comment: Squat-pivot to and from [facility] with no assistance.     Balance Overall balance assessment: Independent Sitting-balance support: Single extremity supported Sitting balance-Leahy Scale:  Good Sitting balance - Comments: able to reach down and don bedrook slipper on LLE without any instability or difficulty                                   ADL either performed or assessed with clinical judgement   ADL Overall ADL's : Modified independent                                       General ADL Comments: Able to demonstrate modified independence with ADL at wheelchair level.      Vision Patient Visual Report: No change from baseline Vision Assessment?: No apparent visual deficits     Perception     Praxis      Pertinent Vitals/Pain Pain Assessment: Faces Faces Pain Scale: Hurts a little bit Pain Location: back pain Pain Descriptors / Indicators: Aching Pain Intervention(s): Monitored during session;Repositioned     Hand Dominance Right   Extremity/Trunk Assessment Upper Extremity Assessment Upper Extremity Assessment: Overall WFL for tasks assessed   Lower Extremity Assessment Lower Extremity Assessment: RLE deficits/detail;LLE deficits/detail RLE Deficits / Details: History of AKA LLE Deficits / Details: History of transmetatarsal amputation       Communication Communication Communication: No difficulties   Cognition Arousal/Alertness: Awake/alert Behavior During Therapy: WFL for tasks assessed/performed Overall Cognitive Status: No family/caregiver present to determine baseline cognitive functioning Area of Impairment: Orientation  Orientation Level: Time             General Comments: Decreased orientation to time.    General Comments       Exercises     Shoulder Instructions      Home Living Family/patient expects to be discharged to:: Skilled nursing facility Living Arrangements: Other relatives Available Help at Discharge: Family Type of Home: House       Home Layout: One level         Bathroom Toilet: Standard     Home Equipment: Walker - standard;Wheelchair -  manual;Shower seat;Hand held shower head;Grab bars - toilet;Grab bars - tub/shower;Bedside commode          Prior Functioning/Environment Level of Independence: Independent with assistive device(s)        Comments: independent with wheel chair; does IADL at w/c level; SCAT for transportation        OT Problem List: Decreased activity tolerance      OT Treatment/Interventions:      OT Goals(Current goals can be found in the care plan section) Acute Rehab OT Goals Patient Stated Goal: go to SNF OT Goal Formulation: With patient  OT Frequency:     Barriers to D/C:            Co-evaluation              AM-PAC PT "6 Clicks" Daily Activity     Outcome Measure Help from another person eating meals?: None Help from another person taking care of personal grooming?: None Help from another person toileting, which includes using toliet, bedpan, or urinal?: None Help from another person bathing (including washing, rinsing, drying)?: None Help from another person to put on and taking off regular upper body clothing?: None Help from another person to put on and taking off regular lower body clothing?: None 6 Click Score: 24   End of Session Nurse Communication: Mobility status  Activity Tolerance: Patient tolerated treatment well Patient left: with call bell/phone within reach;in chair  OT Visit Diagnosis: Other abnormalities of gait and mobility (R26.89)                Time: 1030-1100 OT Time Calculation (min): 30 min Charges:  OT General Charges $OT Visit: 1 Visit OT Evaluation $OT Eval Low Complexity: 1 Low OT Treatments $Self Care/Home Management : 8-22 mins G-Codes:     Brandi Herrlich, MS OTR/L  Pager: [place] A Brandi Arellano 02/15/2017, 1:48 PM

## 2017-02-15 NOTE — Progress Notes (Signed)
CSW spoke with pt earlier today to discuss options.  Patient reports her brother won't let her back in home and that she is unwilling to return living with him.  CSW explained that because pt is at baseline not sure we can skill her so might need to consider ALF placement- pt agreeable.  CSW spoke with ALF coordinator who will assess pt tomorrow for her buildings.  CSW called [facility] again who maintains they will not accept patient- called Facilities manager again who states she will relook at patient.  Only bed offer continues to be [facility] who will only take patient if dialysis is changed to [place].  CSW will continue to follow- no offers at this time  Jorge Ny, [place] Social Worker [phone]

## 2017-02-15 NOTE — Evaluation (Signed)
Physical Therapy Evaluation Patient Details Name: Brandi Arellano MRN: [id number] DOB: 07-01-1955 Today's Date: 02/15/2017   History of Present Illness  patient is a 61 yo female with complex medical history inclusive of R BKA, DM2, ESRD, CAD, CHF, HTN, left forefoot amputation who presents for AMS, trauma to dialysis catheter rrequiring replacement, and cardiac cath.  Clinical Impression  Patient seen for mobility assessment. Patient is independent with bed level and transfer activity. Patient is w/c bound at baseline and at this time does not require any cues or physical assist. Patient states that she wants to go to a rehabilitation center because she is no longer able to live with her brother. Patient also states that she has had a prosthesis made for her but has not been able to pick it up. If patient obtains prosthesis, then may consider ST rehab for prosthetic and gait training, otherwise, feel patient is at her mobility baseline. No further acute PT needs, will sign off.    Follow Up Recommendations No PT follow up(w/c bound at baseline)    Equipment Recommendations  None recommended by PT    Recommendations for Other Services       Precautions / Restrictions        Mobility  Bed Mobility Overal bed mobility: Independent             General bed mobility comments: no physical assist, good time, able to perform rolling and elevation to upright without therapist cues or physical assist from flat bed  Transfers Overall transfer level: Independent   Transfers: Squat Pivot Transfers     Squat pivot transfers: Independent     General transfer comment: performed squat pivot to drop arm without any physical assist or cues, good time and technique  Ambulation/Gait             General Gait Details: non ambulatory at baseline w/c level for mobility until patient received her prosthesis  Stairs            Wheelchair Mobility    Modified Rankin (Stroke  Patients Only)       Balance Overall balance assessment: Independent Sitting-balance support: Single extremity supported Sitting balance-Leahy Scale: Good Sitting balance - Comments: able to reach down and don bedrook slipper on LLE without any instability or difficulty                                     Pertinent Vitals/Pain Pain Assessment: Faces Faces Pain Scale: Hurts a little bit Pain Location: back pain Pain Descriptors / Indicators: Aching Pain Intervention(s): Monitored during session;Repositioned    Home Living Family/patient expects to be discharged to:: Private residence Living Arrangements: Other relatives Available Help at Discharge: Family Type of Home: House       Home Layout: One level Home Equipment: Walker - standard;Wheelchair - manual      Prior Function Level of Independence: Independent with assistive device(s)(uses w/c for mobility, SCAT for transportation)         Comments: independent with wheel chair     Hand Dominance   Dominant Hand: Right    Extremity/Trunk Assessment        Lower Extremity Assessment Lower Extremity Assessment: RLE deficits/detail;LLE deficits/detail RLE Deficits / Details: history of AKA LLE Deficits / Details: history of forefoot amputation        Communication   Communication: No difficulties  Cognition Arousal/Alertness: Awake/alert Behavior During Therapy: [facility]  for tasks assessed/performed Overall Cognitive Status: No family/caregiver present to determine baseline cognitive functioning                                 General Comments: appears within functional limits for cognition       General Comments      Exercises     Assessment/Plan    PT Assessment Patent does not need any further PT services  PT Problem List         PT Treatment Interventions      PT Goals (Current goals can be found in the Care Plan section)  Acute Rehab PT Goals PT Goal  Formulation: All assessment and education complete, DC therapy    Frequency     Barriers to discharge        Co-evaluation               AM-PAC PT "6 Clicks" Daily Activity  Outcome Measure Difficulty turning over in bed (including adjusting bedclothes, sheets and blankets)?: None Difficulty moving from lying on back to sitting on the side of the bed? : None Difficulty sitting down on and standing up from a chair with arms (e.g., wheelchair, bedside commode, etc,.)?: None Help needed moving to and from a bed to chair (including a wheelchair)?: None Help needed walking in hospital room?: Total Help needed climbing 3-5 steps with a railing? : Total 6 Click Score: 18    End of Session   Activity Tolerance: Patient tolerated treatment well Patient left: in chair;with call bell/phone within reach;with chair alarm set Nurse Communication: Mobility status PT Visit Diagnosis: Other symptoms and signs involving the nervous system (R29.898)    Time: 4628-6381 PT Time Calculation (min) (ACUTE ONLY): 18 min   Charges:   PT Evaluation $PT Eval Low Complexity: 1 Low     PT G Codes:        Alben Deeds, PT DPT  Board Certified Neurologic Specialist [place] 02/15/2017, 10:30 AM

## 2017-02-15 NOTE — Progress Notes (Signed)
[facility] Progress Note   Subjective: Seen in room.  Has no complaints. Tolerated HD yesterday without issues. Waiting for bed at [place]:   02/14/17 1731 02/14/17 2042 02/15/17 0438 02/15/17 0907  BP: 132/68 113/65 125/62 107/86  Pulse: 60 60 60 (!) 58  Resp: 18 18 18 18   Temp: 98.2 F (36.8 C) 98.8 F (37.1 C) 98.9 F (37.2 C) 98.4 F (36.9 C)  TempSrc: Oral Oral Oral Oral  SpO2: 98% 98% 98% 97%  Weight:  56.7 kg (125 lb)    Height:       Physical Exam General: WNWD female NAD  Heart: RRR 2/6 SEM Lungs: CTAB Abdomen: soft NT, ND Extremities: R AKA, no L LE edema  Dialysis Access: R IJ [facility]   Dialysis Orders:  [place] MWF 4h   56.5kg  2/2.25  Hep 4000   R IJ TDC (new one placed 10/29) -hect 4 ug tiw -BMM: Fosrenol 1000mg  2 tabs tid   Assessment/Plan: 1. Cardiac stress + for ischemia/ hx remote CABG - s/p cardiac cath 11/2 - severe diffuse native coronary artery diseaseand patent bypass grafts x 3 . No intervention required at this point and will continue med management  2. Bladder mass with voiding difficulty/ chronic indwelling foley - per urology will undergo TURBT and percutaneous nephrostomies at some point as an out-patient.  3. AMS - resolved, suspected to be iatrogenic due to pain meds 4. ESRD - MWF HD. Catheter-dependent, not a candidate for perm access due to severe PAD.Next HD 11/7 5. HTN/ volume - Blood pressures well controlled, continue amlodipine. Volume stable, reaching EDW  6. Anemia of CKD - Hb 9.0, no overt losses, Will give Aranesp 60 mcg with HD 11/7  7. MBD of CKD - continue VDRA/ phosphorus at goal on current binder dose 8. DM per primary  9. Suicidal Ideation -seen by psychiatry, no active SI , does not need anymedications     Lynnda Child PA-C [facility] Pager [phone] 02/15/2017,9:32 AM  LOS: 8 days   Additional Objective Labs: Basic Metabolic Panel: Recent Labs  Lab  02/10/17 0730 02/11/17 0950 02/14/17 0730  NA 132* 131* 128*  K 4.2 4.5 4.8  CL 96* 96* 92*  CO2 27 27 26   GLUCOSE 141* 152* 135*  BUN 24* 45* 60*  CREATININE 4.80* 6.62* 7.60*  CALCIUM 8.5* 9.3 9.6  PHOS 3.0 3.1 3.0   CBC: Recent Labs  Lab 02/09/17 0302 02/11/17 0900 02/14/17 0730  WBC 6.6 5.4 5.0  HGB 10.7* 10.1* 9.0*  HCT 34.7* 32.3* 28.8*  MCV 96.1 95.6 95.4  PLT 148* 135* 115*   Blood Culture    Component Value Date/Time   SDES URINE, RANDOM 02/07/2017 0138   SPECREQUEST NONE 02/07/2017 0138   CULT NO GROWTH 02/07/2017 0138   REPTSTATUS 02/08/2017 FINAL 02/07/2017 0138    Cardiac Enzymes: No results for input(s): CKTOTAL, CKMB, CKMBINDEX, TROPONINI in the last 168 hours. CBG: Recent Labs  Lab 02/14/17 2034 02/15/17 0008 02/15/17 0431 02/15/17 0757 02/15/17 0816  GLUCAP 152* 101* 156* 59* 68   Iron Studies: No results for input(s): IRON, TIBC, TRANSFERRIN, FERRITIN in the last 72 hours. Lab Results  Component Value Date   INR 1.03 02/11/2017   INR 1.12 02/07/2017   INR 1.29 07/19/2014   Medications:  . amLODipine  5 mg Oral Daily  . aspirin EC  81 mg Oral BID  . atorvastatin  40 mg Oral QPM  .  chlorhexidine  15 mL Mouth Rinse BID  . docusate sodium  100 mg Oral Daily  . doxercalciferol  4 mcg Intravenous Q M,W,F-HD  . folic acid  1 mg Oral Daily  . gabapentin  100 mg Oral BID  . heparin  5,000 Units Subcutaneous Q8H  . insulin aspart  0-9 Units Subcutaneous Q4H  . lanthanum  1,000 mg Oral TID WC  . levothyroxine  25 mcg Oral QAC breakfast  . mouth rinse  15 mL Mouth Rinse q12n4p  . metoprolol tartrate  25 mg Oral BID  . multivitamin with minerals  1 tablet Oral Daily  . sodium chloride flush  3 mL Intravenous Q12H  . thiamine  100 mg Oral Daily

## 2017-02-15 NOTE — Progress Notes (Signed)
Family Medicine Teaching Service Daily Progress Note Intern Pager: [phone]  Patient name: Brandi Arellano Medical record number: [id number] Date of birth: March 25, 1956 Age: 61 y.o. Gender: female  Primary Care Provider: Kerin Perna, NP Consultants: nephrology, vascular surgery, urology, psychiatry, cardiology Code Status: partial code (DNI, thinking about DNR)  [name]:  10/28 admitted to fpts  10/29 seen by vvs, permcath replaced, dialysis received, seen by urology 10/30 evaluated by psych 10/31 dialysis 11/01 myoview with ischemic findings 11/02 HD, cardiac catheterization 11/05 HD  Assessment and Plan: Cardiac ischemia Underwent cardiac cath, no PCI targets. Medical management.  - cardiology recommendations appreciated - vital signs per floor routine - cardiac monitoring  Altered Mental Status Has resolved and patient is now aox4.   vital signs per floor routine - cardiac monitoring - tylenol for pain, fever  ESRD -Pt had severed catheter tip s/p replacement on 10/29. Had HD on 11/2. See social below. Appreciate nephrology recs, creatinine 9.5 10/30. k 4.7.  - dialysis per nephrology  Abdominal pain, Resolved.  Most likely due to neuropathic pain in legs after improvement on gabapentin. - zofran for nausea - gabapentin for neuropathic pain  Nausea and vomiting Resolved. Tolerating po. -zofran and reglan as above  Shortness of breath Resolved. Weaned down to room air while in room. No shortness of breath endorsed. Will continue to follow. Likely volume overload from dialysis. - dialysis per nephro recs   Bladder Mass Patient with a bladder mass in the left posterior base. Potentially the nidus for chronic uti. Large amount of hgb on last few UAs. Urology saw on 10/29.  Receiving myoview due to mulitple cardiac risk factors, pending normal, Will perform cystoscopy as outpt at [place] [facility] in 1-2 weeks.  Potential suicide  attempt Patient's family concerned that his may have been a suicide attempt. Patient denies any thoughts or plans of self-harm or harming others. Evaluated by psych who felt that patient was not at risk for suicide. Will follow up outpatient with psych. 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 Has had recurrent UTI. S/p treatment with vantin. Patient with foul smelling urine. No growth on UA, so cefepime stopped. Continue foley until outpatient follow for procedure with urology.  H/O UTI As above  History of CVA   Patient with right thalamic infarct appreciated on head ct. Has no residual deficits. Already on high intensity statin and aspirin as outpt. - continue atorvastatin, asa 81  Type II diabetes CBGs: 104-224 in past 24 hrs. on sSSI while inpatinet. A1c 6.8 on 10/29. - sSSI - q 4 hour checks  Hyperlipidemia Patient currently on atorvastatin. No lipid panel in system. Continue atorvastatin. Will need lipid panel drawn as outpatient by pcp. - continue atorvastatin - lipid panel at pcp follow up  Hypertension Patient with norvasc 5mg  daily at home. Current pressure 133/63. Hr 74. - continue home norvasc - prn labetolol or hydralazine for systolic>170  Hypothyroidism Continue home synthroid 65mcg daily  Right Above Knee Amputation Patient with no skin breakdown at stump site. Has a nervous tic where she shakes this stump apparently due to phantom limb pain. Will monitor for any s/s developing ulcer.   Left transmetatarsal amputation Stump site without any abnormalities. C/D/i with no skin breakdown.  Diabetic Gastroparesis Will likely need upper GI study as outpatient. Could potentially benefit from vagal nerve stimulator. If abdominal pain does not resolve can consider upper gi  while here.  Constipation Improved. 2x BM yesterday after SMOG enema.  - cont Miralax and senna an  needed  PT/OT - awaiting evaluation from PT and OT for SNF placement - PT and OT consult appreciated.  Dispo: Medically stable for discharge, awaiting SNF placement  FEN/GI: renal/carb modified, miralax PPx: heparin   Subjective: No complaints today. Ready to go to SNF.  Objective: Temp:  [98 F (36.7 C)-98.9 F (37.2 C)] 98.9 F (37.2 C) (11/06 0438) Pulse Rate:  [52-60] 60 (11/06 0438) Resp:  [18] 18 (11/06 0438) BP: (113-166)/(60-79) 125/62 (11/06 0438) SpO2:  [98 %] 98 % (11/06 0438) Weight:  [125 lb (56.7 kg)-125 lb 7.1 oz (56.9 kg)] 125 lb (56.7 kg) (11/05 2042) Physical Exam: General: NAD, well appearing CVS: RRR,  murmur Lungs: CTAB, no increased work of breathing Abdomen: Soft, nontender, nondistended MSK: No lower extremity edema, right AKA  Laboratory: Recent Labs  Lab 02/09/17 0302 02/11/17 0900 02/14/17 0730  WBC 6.6 5.4 5.0  HGB 10.7* 10.1* 9.0*  HCT 34.7* 32.3* 28.8*  PLT 148* 135* 115*   Recent Labs  Lab 02/10/17 0730 02/11/17 0950 02/14/17 0730  NA 132* 131* 128*  K 4.2 4.5 4.8  CL 96* 96* 92*  CO2 27 27 26   BUN 24* 45* 60*  CREATININE 4.80* 6.62* 7.60*  CALCIUM 8.5* 9.3 9.6  GLUCOSE 141* 152* 135*   Imaging/Diagnostic Tests: CLINICAL DATA:  Status post dialysis catheter placement  EXAM: PORTABLE CHEST 1 VIEW  COMPARISON:  02/06/2017  FINDINGS: Right-sided dialysis catheter is in place with tips in the right atrium. No pneumothorax identified. Mild cardiac enlargement. Bilateral pleural effusions and pulmonary edema are similar to previous exam  IMPRESSION: 1. No change in pulmonary edema and pleural effusions 2. No complications after right-sided dialysis catheter placement.  Bonnita Hollow, MD 02/15/2017, 8:34 AM PGY-1, [place] Intern pager: [phone], text pages welcome

## 2017-02-15 NOTE — Care Management Note (Signed)
Case Management Note  Patient Details  Name: Matsuko Kretz MRN: [id number] Date of Birth: 07-21-1955  Subjective/Objective:       Pt admitted with trauma to HD catheter  - catheter was replaced             Action/Plan:  PTA from home with brother - pt has hx of BKA and is wheelchair bound at baseline.  Pt has new prosthetic (prosthetic leg is in [facility]) and is interested in discharging to SNF to began rehab needed for successful prosthetic mobility.  CSW actively pursuing placement   Expected Discharge Date:                  Expected Discharge Plan:  [place]  In-House Referral:  Clinical Social Work  Discharge planning Services  CM Consult  Post Acute Care Choice:    Choice offered to:     DME Arranged:    DME Agency:     HH Arranged:    [place] Agency:     Status of Service:  In process, will continue to follow  If discussed at Long Length of Stay Meetings, dates discussed:    Additional Comments:  Maryclare Labrador, RN 02/15/2017, 2:40 PM

## 2017-02-16 LAB — CBC
HCT: 29.4 % — ABNORMAL LOW (ref 36.0–46.0)
Hemoglobin: 9.2 g/dL — ABNORMAL LOW (ref 12.0–15.0)
MCH: 29.8 pg (ref 26.0–34.0)
MCHC: 31.3 g/dL (ref 30.0–36.0)
MCV: 95.1 fL (ref 78.0–100.0)
PLATELETS: 116 10*3/uL — AB (ref 150–400)
RBC: 3.09 MIL/uL — AB (ref 3.87–5.11)
RDW: 17.6 % — AB (ref 11.5–15.5)
WBC: 5.6 10*3/uL (ref 4.0–10.5)

## 2017-02-16 LAB — IRON AND TIBC
IRON: 123 ug/dL (ref 28–170)
Saturation Ratios: 61 % — ABNORMAL HIGH (ref 10.4–31.8)
TIBC: 200 ug/dL — AB (ref 250–450)
UIBC: 77 ug/dL

## 2017-02-16 LAB — RENAL FUNCTION PANEL
ALBUMIN: 2.6 g/dL — AB (ref 3.5–5.0)
Anion gap: 8 (ref 5–15)
BUN: 47 mg/dL — AB (ref 6–20)
CALCIUM: 9.5 mg/dL (ref 8.9–10.3)
CO2: 27 mmol/L (ref 22–32)
CREATININE: 6.24 mg/dL — AB (ref 0.44–1.00)
Chloride: 97 mmol/L — ABNORMAL LOW (ref 101–111)
GFR, EST AFRICAN AMERICAN: 8 mL/min — AB (ref >60)
GFR, EST NON AFRICAN AMERICAN: 7 mL/min — AB (ref >60)
Glucose, Bld: 124 mg/dL — ABNORMAL HIGH (ref 65–99)
PHOSPHORUS: 2.7 mg/dL (ref 2.5–4.6)
Potassium: 4.7 mmol/L (ref 3.5–5.1)
Sodium: 132 mmol/L — ABNORMAL LOW (ref 135–145)

## 2017-02-16 LAB — FERRITIN: Ferritin: 1388 ng/mL — ABNORMAL HIGH (ref 11–307)

## 2017-02-16 LAB — GLUCOSE, CAPILLARY
GLUCOSE-CAPILLARY: 191 mg/dL — AB (ref 65–99)
GLUCOSE-CAPILLARY: 189 mg/dL — AB (ref 65–99)
GLUCOSE-CAPILLARY: 180 mg/dL — AB (ref 65–99)
Glucose-Capillary: 150 mg/dL — ABNORMAL HIGH (ref 65–99)
Glucose-Capillary: 251 mg/dL — ABNORMAL HIGH (ref 65–99)

## 2017-02-16 MED ORDER — HEPARIN SODIUM (PORCINE) 1000 UNIT/ML DIALYSIS: 20.0000 [IU]/kg | INTRAMUSCULAR | Status: DC | PRN

## 2017-02-16 MED ORDER — DOXERCALCIFEROL 4 MCG/2ML IV SOLN
INTRAVENOUS | Status: AC
  Administered 2017-02-16: 4 ug via INTRAVENOUS
  Filled 2017-02-16: qty 2

## 2017-02-16 MED ORDER — DARBEPOETIN ALFA 60 MCG/0.3ML IJ SOSY
PREFILLED_SYRINGE | INTRAMUSCULAR | Status: AC
  Administered 2017-02-16: 60 ug via INTRAVENOUS
  Filled 2017-02-16: qty 0.3

## 2017-02-16 MED ORDER — PENTAFLUOROPROP-TETRAFLUOROETH EX AERO: 1.0000 "application " | INHALATION_SPRAY | CUTANEOUS | Status: DC | PRN

## 2017-02-16 MED ORDER — LIDOCAINE HCL (PF) 1 % IJ SOLN: 5.0000 mL | INTRAMUSCULAR | Status: DC | PRN

## 2017-02-16 MED ORDER — SODIUM CHLORIDE 0.9 % IV SOLN: 100.0000 mL | INTRAVENOUS | Status: DC | PRN

## 2017-02-16 MED ORDER — LIDOCAINE-PRILOCAINE 2.5-2.5 % EX CREA: 1.0000 "application " | TOPICAL_CREAM | CUTANEOUS | Status: DC | PRN

## 2017-02-16 MED ORDER — ALTEPLASE 2 MG IJ SOLR: 2.0000 mg | Freq: Once | INTRAMUSCULAR | Status: DC | PRN

## 2017-02-16 MED ORDER — HEPARIN SODIUM (PORCINE) 1000 UNIT/ML DIALYSIS: 1000.0000 [IU] | INTRAMUSCULAR | Status: DC | PRN

## 2017-02-16 NOTE — Progress Notes (Signed)
Family Medicine Teaching Service Daily Progress Note Intern Pager: [phone]  Patient name: Brandi Arellano Medical record number: [id number] Date of birth: [birth year] Age: 61 y.o. Gender: female  Primary Care Provider: Kerin Perna, NP Consultants: nephrology, vascular surgery, urology, psychiatry, cardiology Code Status: partial code (DNI, thinking about DNR)  [name]:  10/28 admitted to fpts  10/29 seen by vvs, permcath replaced, dialysis received, seen by urology 10/30 evaluated by psych 10/31 dialysis 11/01 myoview with ischemic findings 11/02 HD, cardiac catheterization 11/05 HD 11/07 HD  Assessment and Plan: Cardiac ischemia Underwent cardiac cath, no PCI targets. Medical management.  - cardiology recommendations appreciated - vital signs per floor routine - cardiac monitoring  Altered Mental Status Has resolved and patient is now aox4.   vital signs per floor routine - cardiac monitoring - tylenol for pain, fever  ESRD -Pt had severed catheter tip s/p replacement on 10/29. Had HD on 11/2. See social below. Appreciate nephrology recs, creatinine 9.5 10/30. k 4.7.  - dialysis per nephrology  Abdominal pain, Resolved.  Most likely due to neuropathic pain in legs after improvement on gabapentin. - zofran for nausea - gabapentin for neuropathic pain  Nausea and vomiting Resolved. Tolerating po. -zofran and reglan as above  Shortness of breath Resolved. Weaned down to room air while in room. No shortness of breath endorsed. Will continue to follow. Likely volume overload from dialysis. - dialysis per nephro recs   Bladder Mass Patient with a bladder mass in the left posterior base. Potentially the nidus for chronic uti. Large amount of hgb on last few UAs. Urology saw on 10/29.  Receiving myoview due to mulitple cardiac risk factors, pending normal, Will perform cystoscopy as outpt at [place] [facility] in 1-2 weeks.  Potential  suicide attempt Patient's family concerned that his may have been a suicide attempt. Patient denies any thoughts or plans of self-harm or harming others. Evaluated by psych who felt that patient was not at risk for suicide. Will follow up outpatient with psych. 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 Has had recurrent UTI. S/p treatment with vantin. Patient with foul smelling urine. No growth on UA, so cefepime stopped. Continue foley until outpatient follow for procedure with urology.  H/O UTI As above  History of CVA   Patient with right thalamic infarct appreciated on head ct. Has no residual deficits. Already on high intensity statin and aspirin as outpt. - continue atorvastatin, asa 81  Type II diabetes CBGs: 104-224 in past 24 hrs. on sSSI while inpatinet. A1c 6.8 on 10/29. - sSSI - q 4 hour checks  Hyperlipidemia Patient currently on atorvastatin. No lipid panel in system. Continue atorvastatin. Will need lipid panel drawn as outpatient by pcp. - continue atorvastatin - lipid panel at pcp follow up  Hypertension Patient with norvasc 5mg  daily at home. Current pressure 133/63. Hr 74. - continue home norvasc - prn labetolol or hydralazine for systolic>170  Hypothyroidism Continue home synthroid 55mcg daily  Right Above Knee Amputation Patient with no skin breakdown at stump site. Has a nervous tic where she shakes this stump apparently due to phantom limb pain. Will monitor for any s/s developing ulcer.   Left transmetatarsal amputation Stump site without any abnormalities. C/D/i with no skin breakdown.  Diabetic Gastroparesis Will likely need upper GI study as outpatient. Could potentially benefit from vagal nerve stimulator. If abdominal pain does not resolve can consider  upper gi while here.  Constipation Improved. 2x BM yesterday after SMOG enema.  - cont Miralax and senna an  needed  PT/OT PT/OT recommended No need for PT/OT. Does not qualify for SNF  Dispo: Medically stable for discharge, awaiting ALF placement, will need HD moved  FEN/GI: renal/carb modified, miralax PPx: heparin  Subjective: No complaints today. Ready to go to SNF.  Objective: Temp:  [98 F (36.7 C)-98.7 F (37.1 C)] 98 F (36.7 C) (11/07 0750) Pulse Rate:  [54-58] 56 (11/07 0900) Resp:  [16-18] 16 (11/07 0900) BP: (121-159)/(56-77) 159/73 (11/07 0900) SpO2:  [94 %-99 %] 98 % (11/07 0830) Weight:  [129 lb 3 oz (58.6 kg)] 129 lb 3 oz (58.6 kg) (11/07 0730) Physical Exam: General: NAD, well appearing CVS: RRR,  murmur Lungs: CTAB, no increased work of breathing Abdomen: Soft, nontender, nondistended MSK: No lower extremity edema, right AKA  Laboratory: Recent Labs  Lab 02/11/17 0900 02/14/17 0730  WBC 5.4 5.0  HGB 10.1* 9.0*  HCT 32.3* 28.8*  PLT 135* 115*   Recent Labs  Lab 02/10/17 0730 02/11/17 0950 02/14/17 0730  NA 132* 131* 128*  K 4.2 4.5 4.8  CL 96* 96* 92*  CO2 27 27 26   BUN 24* 45* 60*  CREATININE 4.80* 6.62* 7.60*  CALCIUM 8.5* 9.3 9.6  GLUCOSE 141* 152* 135*   Imaging/Diagnostic Tests: CLINICAL DATA:  Status post dialysis catheter placement  EXAM: PORTABLE CHEST 1 VIEW  COMPARISON:  02/06/2017  FINDINGS: Right-sided dialysis catheter is in place with tips in the right atrium. No pneumothorax identified. Mild cardiac enlargement. Bilateral pleural effusions and pulmonary edema are similar to previous exam  IMPRESSION: 1. No change in pulmonary edema and pleural effusions 2. No complications after right-sided dialysis catheter placement.  Bonnita Hollow, MD 02/16/2017, 9:28 AM PGY-1, [place] Intern pager: [phone], text pages welcome

## 2017-02-16 NOTE — Procedures (Signed)
Patient seen on Hemodialysis. QB 400, UF goal 2.5L Treatment adjusted as needed.  Elmarie Shiley MD [facility]. Office # [phone] Pager # [phone] 8:31 AM

## 2017-02-16 NOTE — Progress Notes (Signed)
[facility] evaluating patient for possible admission  Jorge Ny, LCSW Clinical Social Worker [phone]

## 2017-02-16 NOTE — NC FL2 (Signed)
[place] LEVEL OF CARE SCREENING TOOL     IDENTIFICATION  Patient Name: Brandi Arellano Birthdate: 03-Feb-1956 Sex: female Admission Date (Current Location): 02/06/2017  [facility] and [place] Number:  Herbalist and Address:  [facility] [place], [place] [address], [place], [place] [address]      Provider Number: [id number]  Attending Physician Name and Address:  Alveda Reasons, MD  Relative Name and Phone Number:       Current Level of Care: Hospital Recommended Level of Care: [place] Prior Approval Number:    Date Approved/Denied:   PASRR Number:    Discharge Plan: Other (Comment)(ALF)    Current Diagnoses: Patient Active Problem List   Diagnosis Date Noted  . S/P dialysis catheter insertion ([place])   . Nausea and vomiting   . Generalized abdominal pain   . Stable angina pectoris ([place])   . Abnormal stress test   . Bladder mass 02/09/2017  . Preoperative evaluation to rule out surgical contraindication 02/09/2017  . Preoperative cardiovascular examination   . ESRD on hemodialysis ([place])   . Altered mental status 02/07/2017  . Axillary mass, left 07/01/2016  . Dupuytren's contracture of left hand 05/07/2016  . HLD (hyperlipidemia) 08/04/2015  . Conjunctivitis 08/04/2015  . Insomnia 07/09/2015  . Gangrene associated with diabetes mellitus ([place]) 03/20/2014  . Gastroparesis due to DM ([place]) 02/12/2014  . Status post above knee amputation, right ([place]) 02/08/2014  . Buerger's disease ([place]) 02/08/2014  . Hyperparathyroidism, secondary renal ([place]) 02/06/2014  . Hyperphosphatemia 02/06/2014  . Anemia in chronic kidney disease 02/06/2014  . 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 Essential hypertension 12/26/2013  . DM type 2, uncontrolled, with renal complications ([place]) 10/06/9483  . CKD (chronic kidney disease) 12/26/2013  . Hypothyroidism 12/26/2013  . CAD (coronary artery disease)  12/26/2013  . End stage renal disease ([place]) 12/14/2013    Orientation RESPIRATION BLADDER Height & Weight     Self, Time, Situation, Place  Normal Continent Weight: 129 lb 3 oz (58.6 kg) Height:  5\' 6"  (167.6 cm)  BEHAVIORAL SYMPTOMS/MOOD NEUROLOGICAL BOWEL NUTRITION STATUS      Continent Diet(please see discharge summary. )  AMBULATORY STATUS COMMUNICATION OF NEEDS Skin   Extensive Assist(wheelchair at baseline) Verbally Surgical wounds(on chest)                       Personal Care Assistance Level of Assistance  Bathing, Dressing Bathing Assistance: Limited assistance Feeding assistance: Independent Dressing Assistance: Limited assistance     Functional Limitations Info  Sight, Hearing, Speech Sight Info: Adequate Hearing Info: Adequate Speech Info: Adequate    SPECIAL CARE FACTORS FREQUENCY  PT (By licensed PT), OT (By licensed OT)     PT Frequency: NA OT Frequency: NA            Contractures Contractures Info: Not present    Additional Factors Info  Code Status Code Status Info: Partial Allergies Info: Diphenhydramine, Tramadol Hcl, Morphine And Related           Current Medications (02/16/2017):  This is the current hospital active medication list Current Facility-Administered Medications  Medication Dose Route Frequency Provider Last Rate Last Dose  . Darbepoetin Alfa (ARANESP) 60 MCG/0.3ML injection           . doxercalciferol (HECTOROL) 4 MCG/2ML injection           . 0.9 %  sodium chloride  infusion  100 mL Intravenous PRN Lynnda Child, PA-C      . 0.9 %  sodium chloride infusion  100 mL Intravenous PRN Ejigiri, Thomos Lemons, PA-C      . acetaminophen (TYLENOL) tablet 650 mg  650 mg Oral Q6H PRN Sela Hilding, MD   650 mg at 02/15/17 2204   Or  . acetaminophen (TYLENOL) suppository 650 mg  650 mg Rectal Q6H PRN Sela Hilding, MD      . alteplase (CATHFLO ACTIVASE) injection 2 mg  2 mg Intracatheter Once PRN Lynnda Child, PA-C      . amLODipine (NORVASC) tablet 5 mg  5 mg Oral Daily Sela Hilding, MD   5 mg at 02/15/17 1107  . aspirin EC tablet 81 mg  81 mg Oral BID Sela Hilding, MD   81 mg at 02/15/17 2204  . atorvastatin (LIPITOR) tablet 40 mg  40 mg Oral QPM Sela Hilding, MD   40 mg at 02/15/17 1708  . chlorhexidine (PERIDEX) 0.12 % solution 15 mL  15 mL Mouth Rinse BID Leeanne Rio, MD   15 mL at 02/15/17 2210  . Darbepoetin Alfa (ARANESP) injection 60 mcg  60 mcg Intravenous Q Wed-HD Lynnda Child, PA-C      . docusate sodium (COLACE) capsule 100 mg  100 mg Oral Daily Guadalupe Dawn, MD   100 mg at 02/14/17 1528  . doxercalciferol (HECTOROL) injection 4 mcg  4 mcg Intravenous Q M,W,F-HD Lynnda Child, PA-C   4 mcg at 02/14/17 1153  . folic acid (FOLVITE) tablet 1 mg  1 mg Oral Daily Sela Hilding, MD   1 mg at 02/13/17 0851  . gabapentin (NEURONTIN) capsule 100 mg  100 mg Oral BID Wendee Beavers T, MD   100 mg at 02/14/17 2203  . heparin injection 1,000 Units  1,000 Units Dialysis PRN Lynnda Child, PA-C      . heparin injection 1,100 Units  20 Units/kg Dialysis PRN Lynnda Child, PA-C      . heparin injection 5,000 Units  5,000 Units Subcutaneous Q8H End, Harrell Gave, MD   5,000 Units at 02/15/17 2206  . insulin aspart (novoLOG) injection 0-9 Units  0-9 Units Subcutaneous Q4H Guadalupe Dawn, MD   5 Units at 02/16/17 0000  . lanthanum (FOSRENOL) chewable tablet 1,000 mg  1,000 mg Oral TID WC Lynnda Child, PA-C   1,000 mg at 02/15/17 1709  . levothyroxine (SYNTHROID, LEVOTHROID) tablet 25 mcg  25 mcg Oral QAC breakfast Sela Hilding, MD   25 mcg at 02/15/17 0831  . lidocaine (PF) (XYLOCAINE) 1 % injection 5 mL  5 mL Intradermal PRN Lynnda Child, PA-C      . lidocaine-prilocaine (EMLA) cream 1 application  1 application Topical PRN Lynnda Child, PA-C      . MEDLINE mouth rinse  15 mL Mouth Rinse q12n4p  Leeanne Rio, MD   15 mL at 02/15/17 1710  . metoprolol tartrate (LOPRESSOR) tablet 25 mg  25 mg Oral BID Sueanne Margarita, MD   25 mg at 02/15/17 2204  . multivitamin with minerals tablet 1 tablet  1 tablet Oral Daily Sela Hilding, MD   1 tablet at 02/13/17 0851  . pentafluoroprop-tetrafluoroeth (GEBAUERS) aerosol 1 application  1 application Topical PRN Ejigiri, Thomos Lemons, PA-C      . senna-docusate (Senokot-S) tablet 1 tablet  1 tablet Oral QHS PRN Rory Percy, DO      . sodium chloride flush (  NS) 0.9 % injection 3 mL  3 mL Intravenous Q12H Sela Hilding, MD   3 mL at 02/15/17 2210  . thiamine (VITAMIN B-1) tablet 100 mg  100 mg Oral Daily Sela Hilding, MD   100 mg at 02/13/17 1610   Facility-Administered Medications Ordered in Other Encounters  Medication Dose Route Frequency Provider Last Rate Last Dose  . 0.9 %  sodium chloride infusion    Continuous PRN Laretta Alstrom, CRNA         Discharge Medications: Please see discharge summary for a list of discharge medications.  Relevant Imaging Results:  Relevant Lab Results:   Additional Information [id number]  Jorge Ny, LCSW

## 2017-02-16 NOTE — Progress Notes (Signed)
Patient ID: Brandi Arellano, female   DOB: [birth year], 61 y.o.   MRN: [id number]  [facility] Progress Note   Assessment/ Plan:   1. Cardiac stress + for ischemia/ hx remote CABG - s/p cardiac cath 11/2 - severe diffuse native coronary artery disease and patent bypass grafts x 3 . Reccommendations are for continued medical management .  2. Bladder mass with voiding difficulty/ chronic indwelling foley - To undergo TURBT and percutaneous nephrostomies at some point as an out-patient per urology.  3. ESRD - MWF HD. Catheter-dependent, not a candidate for perm access due to severe PAD.  4. HTN/ volume - Blood pressures well controlled, continue amlodipine 5. Anemia of CKD - Hb 9.0, no overt losses,  ESA today at HD. Will check iron studies.  6. MBD of CKD - continue VDRA/ phosphorus at goal on current binder dose 7. DM per primary  8. Suicidal Ideation - seen by psychiatry, no active SI and acute illness delirium is resolved.   Subjective:   Continues to feel better and awaiting placement to ALF--she declines any facility outside of [facility].   Objective:   BP 122/65 (BP Location: Right Arm)   Pulse (!) 55   Temp 98.5 F (36.9 C) (Oral)   Resp 18   Ht 5\' 6"  (1.676 m)   Wt 58.6 kg (129 lb 3 oz)   SpO2 94%   BMI 20.85 kg/m   Physical Exam: ERD:[id number] resting in dialysis DJS:HFWYO regular rhythm and normal rate Resp:Clear to auscultation and no rales/rhonchi audible. RIJ TDC VZC:HYIF, flat, NT, bowel sounds normal Ext: s/p right AKA and left TMA, no edema  Labs: BMET Recent Labs  Lab 02/10/17 0730 02/11/17 0950 02/14/17 0730  NA 132* 131* 128*  K 4.2 4.5 4.8  CL 96* 96* 92*  CO2 27 27 26   GLUCOSE 141* 152* 135*  BUN 24* 45* 60*  CREATININE 4.80* 6.62* 7.60*  CALCIUM 8.5* 9.3 9.6  PHOS 3.0 3.1 3.0   CBC Recent Labs  Lab 02/11/17 0900 02/14/17 0730  WBC 5.4 5.0  HGB 10.1* 9.0*  HCT 32.3* 28.8*  MCV 95.6 95.4  PLT 135* 115*   Medications:    .  amLODipine  5 mg Oral Daily  . aspirin EC  81 mg Oral BID  . atorvastatin  40 mg Oral QPM  . chlorhexidine  15 mL Mouth Rinse BID  . darbepoetin (ARANESP) injection - DIALYSIS  60 mcg Intravenous Q Wed-HD  . docusate sodium  100 mg Oral Daily  . doxercalciferol  4 mcg Intravenous Q M,W,F-HD  . folic acid  1 mg Oral Daily  . gabapentin  100 mg Oral BID  . heparin  5,000 Units Subcutaneous Q8H  . insulin aspart  0-9 Units Subcutaneous Q4H  . lanthanum  1,000 mg Oral TID WC  . levothyroxine  25 mcg Oral QAC breakfast  . mouth rinse  15 mL Mouth Rinse q12n4p  . metoprolol tartrate  25 mg Oral BID  . multivitamin with minerals  1 tablet Oral Daily  . sodium chloride flush  3 mL Intravenous Q12H  . thiamine  100 mg Oral Daily   Elmarie Shiley, MD 02/16/2017, 8:31 AM

## 2017-02-16 NOTE — Progress Notes (Signed)
RN resume care, patient is in dialysis.  Hav, RN

## 2017-02-17 LAB — URINALYSIS, ROUTINE W REFLEX MICROSCOPIC
Bilirubin Urine: NEGATIVE
Glucose, UA: NEGATIVE mg/dL
Ketones, ur: 5 mg/dL — AB
NITRITE: NEGATIVE
PROTEIN: 100 mg/dL — AB
SPECIFIC GRAVITY, URINE: 1.011 (ref 1.005–1.030)
pH: 7 (ref 5.0–8.0)

## 2017-02-17 LAB — GLUCOSE, CAPILLARY
GLUCOSE-CAPILLARY: 127 mg/dL — AB (ref 65–99)
GLUCOSE-CAPILLARY: 143 mg/dL — AB (ref 65–99)
GLUCOSE-CAPILLARY: 149 mg/dL — AB (ref 65–99)
GLUCOSE-CAPILLARY: 153 mg/dL — AB (ref 65–99)
GLUCOSE-CAPILLARY: 263 mg/dL — AB (ref 65–99)
Glucose-Capillary: 155 mg/dL — ABNORMAL HIGH (ref 65–99)

## 2017-02-17 NOTE — Progress Notes (Signed)
[facility] Progress Note   Subjective:   Seen in room, eating breakfast. HD yesterday with no complaints this morning. Waiting for placement at ALF, does not want to go outside [place].   Objective Vitals:   02/16/17 1150 02/16/17 1728 02/16/17 2019 02/17/17 0417  BP: (!) 165/74 (!) 186/79 (!) 102/40 (!) 143/82  Pulse: 62 64 60 60  Resp: 17 18 18 18   Temp: 98.4 F (36.9 C) 97.8 F (36.6 C) 98.1 F (36.7 C) 98.2 F (36.8 C)  TempSrc: Oral Oral Oral Oral  SpO2: 99% 98% 95% 95%  Weight: 55.5 kg (122 lb 5.7 oz)  55.5 kg (122 lb 5.7 oz)   Height:       Physical Exam General: WNWD female NAD  Heart: RRR 2/6 SEM Lungs: CTAB Abdomen: soft NT, ND Extremities: R AKA, no L LE edema  Dialysis Access: R IJ TDC   Dialysis Orders:  [place] MWF 4h   56.5kg  2/2.25  Hep 4000   R IJ TDC (new one placed 10/29) -hect 4 ug tiw -BMM: Fosrenol 1000mg  2 tabs tid   Assessment/Plan: 1. Cardiac stress + for ischemia/ hx remote CABG - s/p cardiac cath 11/2 - severe diffuse native coronary artery diseaseand patent bypass grafts x 3 . No intervention required at this point and will continue med management  2. Bladder mass with voiding difficulty/ chronic indwelling foley - per urology will undergo TURBT and percutaneous nephrostomies at some point as an out-patient.  3. AMS - resolved, suspected to be iatrogenic due to pain meds 4. ESRD - MWF HD. Catheter-dependent, not a candidate for perm access due to severe PAD. 5. HTN/ volume - Blood pressures well controlled, continue amlodipine. Volume stable, reaching EDW  6. Anemia of CKD - Hb 9.2, no overt losses, Aranesp 60 dosed 11/7 ,Tsat 61% 7. MBD of CKD - continue VDRA/ phosphorus at goal on current binder dose 8. DM per primary  9. Suicidal Ideation -seen by psychiatry, no active SI , does not need anymedications 10. Dispo - awaiting placement to ALF    Lynnda Child PA-C [facility] Pager  [phone] 02/17/2017,9:03 AM  LOS: 10 days   Additional Objective Labs: Basic Metabolic Panel: Recent Labs  Lab 02/11/17 0950 02/14/17 0730 02/16/17 0900  NA 131* 128* 132*  K 4.5 4.8 4.7  CL 96* 92* 97*  CO2 27 26 27   GLUCOSE 152* 135* 124*  BUN 45* 60* 47*  CREATININE 6.62* 7.60* 6.24*  CALCIUM 9.3 9.6 9.5  PHOS 3.1 3.0 2.7   CBC: Recent Labs  Lab 02/11/17 0900 02/14/17 0730 02/16/17 0900  WBC 5.4 5.0 5.6  HGB 10.1* 9.0* 9.2*  HCT 32.3* 28.8* 29.4*  MCV 95.6 95.4 95.1  PLT 135* 115* 116*   Blood Culture    Component Value Date/Time   SDES URINE, RANDOM 02/07/2017 0138   SPECREQUEST NONE 02/07/2017 0138   CULT NO GROWTH 02/07/2017 0138   REPTSTATUS 02/08/2017 FINAL 02/07/2017 0138    Cardiac Enzymes: No results for input(s): CKTOTAL, CKMB, CKMBINDEX, TROPONINI in the last 168 hours. CBG: Recent Labs  Lab 02/16/17 1240 02/16/17 1650 02/16/17 2015 02/17/17 0010 02/17/17 0414  GLUCAP 150* 189* 180* 263* 149*   Iron Studies:  Recent Labs    02/16/17 0900  IRON 123  TIBC 200*  FERRITIN 1,388*   Lab Results  Component Value Date   INR 1.03 02/11/2017   INR 1.12 02/07/2017   INR 1.29 07/19/2014   Medications: .  sodium chloride    . sodium chloride     . amLODipine  5 mg Oral Daily  . aspirin EC  81 mg Oral BID  . atorvastatin  40 mg Oral QPM  . chlorhexidine  15 mL Mouth Rinse BID  . darbepoetin (ARANESP) injection - DIALYSIS  60 mcg Intravenous Q Wed-HD  . docusate sodium  100 mg Oral Daily  . doxercalciferol  4 mcg Intravenous Q M,W,F-HD  . folic acid  1 mg Oral Daily  . gabapentin  100 mg Oral BID  . heparin  5,000 Units Subcutaneous Q8H  . insulin aspart  0-9 Units Subcutaneous Q4H  . lanthanum  1,000 mg Oral TID WC  . levothyroxine  25 mcg Oral QAC breakfast  . mouth rinse  15 mL Mouth Rinse q12n4p  . metoprolol tartrate  25 mg Oral BID  . multivitamin with minerals  1 tablet Oral Daily  . sodium chloride flush  3 mL Intravenous  Q12H  . thiamine  100 mg Oral Daily

## 2017-02-17 NOTE — Progress Notes (Signed)
Patient ID: Brandi Arellano, female   DOB: Aug 25, 1955, 61 y.o.   MRN: [id number]  Her cystoscopy procedure is scheduled for 11/20 at [facility].    Please obtain a urine culture on 11/15 if still in house.  I will place the order.

## 2017-02-17 NOTE — Plan of Care (Signed)
Patients bed alarm is on, bed is in lowest position and call bell is within reach. Patient has ID bracelet and yellow bracelet on.

## 2017-02-17 NOTE — Progress Notes (Addendum)
3pm [facility] visited with pt and thinks pt is good candidate but administrator continues to deny pt admission  CSW updated unit CSW  This CSW signing off  11:20am Pt discussed in [place] this morning- Medical Director instructed CSW to call administrator at [facility] to discuss case and request they reevaluate- CSW spoke with administrator, Karlene Einstein, they will come to hospital to speak with pt before making determination  CSW will continue to follow  Jorge Ny, [place] Social Worker [phone]

## 2017-02-17 NOTE — H&P (View-Only) (Signed)
Patient ID: Brandi Arellano, female   DOB: [birth year], 61 y.o.   MRN: [id number]  Her cystoscopy procedure is scheduled for 11/20 at [facility].    Please obtain a urine culture on 11/15 if still in house.  I will place the order.

## 2017-02-17 NOTE — Progress Notes (Signed)
Family Medicine Teaching Service Daily Progress Note Intern Pager: [phone]  Patient name: Brandi Arellano Medical record number: [id number] Date of birth: 04/30/1955 Age: 61 y.o. Gender: female  Primary Care Provider: Kerin Perna, NP Consultants: nephrology, vascular surgery, urology, psychiatry, cardiology Code Status: partial code (DNI, thinking about DNR)  [name]:  10/28 admitted to fpts  10/29 seen by [facility], permcath replaced, dialysis received, seen by urology 10/30 evaluated by psych 10/31 dialysis 11/01 myoview with ischemic findings 11/02 HD, cardiac catheterization 11/05 HD 11/07 HD  Assessment and Plan: Cardiac ischemia Underwent cardiac cath, no PCI targets. Medical management.  - cardiology recommendations appreciated - vital signs per floor routine - cardiac monitoring  Altered Mental Status Has resolved and patient is now aox4.   vital signs per floor routine - cardiac monitoring - tylenol for pain, fever  ESRD -Pt had severed catheter tip s/p replacement on 10/29. Had HD on 11/2. See social below. Appreciate nephrology recs, creatinine 9.5 10/30. k 4.7.  - dialysis per nephrology  Abdominal pain, Resolved.  Most likely due to neuropathic pain in legs after improvement on gabapentin. - zofran for nausea - gabapentin for neuropathic pain  Nausea and vomiting Resolved. Tolerating po. -zofran and reglan as above  Bladder Mass Patient with a bladder mass in the left posterior base. Potentially the nidus for chronic uti. Large amount of hgb on last few UAs. Urology saw on 10/29.  Receiving myoview due to mulitple cardiac risk factors, pending normal, Will perform cystoscopy as outpt at [facility] in 1-2 weeks.  Potential suicide attempt Patient's family concerned that his may have been a suicide attempt. Patient denies any thoughts or plans of self-harm or harming others. Evaluated by psych who felt that patient was  not at risk for suicide. Will follow up outpatient with psych. 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 Has had recurrent UTI. S/p treatment with vantin. Patient with foul smelling urine. No growth on UA, so cefepime stopped. Continue foley until outpatient follow for procedure with urology.  H/O UTI As above  History of CVA   Patient with right thalamic infarct appreciated on head ct. Has no residual deficits. Already on high intensity statin and aspirin as outpt. - continue atorvastatin, asa 81  Type II diabetes CBGs: 104-224 in past 24 hrs. on sSSI while inpatinet. A1c 6.8 on 10/29. - sSSI - q 4 hour checks  Hyperlipidemia Patient currently on atorvastatin. No lipid panel in system. Continue atorvastatin. Will need lipid panel drawn as outpatient by pcp. - continue atorvastatin - lipid panel at pcp follow up  Hypertension Patient with norvasc 5mg  daily at home.  - continue home norvasc - prn labetolol or hydralazine for systolic>170  Hypothyroidism Continue home synthroid 40mcg daily  Right Above Knee Amputation Patient with no skin breakdown at stump site. Has a nervous tic where she shakes this stump apparently due to phantom limb pain. Will monitor for any s/s developing ulcer.   Left transmetatarsal amputation Stump site without any abnormalities. C/D/i with no skin breakdown.  Diabetic Gastroparesis Will likely need upper GI study as outpatient. Could potentially benefit from vagal nerve stimulator. If abdominal pain does not resolve can consider upper gi while here.  Left arm discomfort: no associated chest pain, intermittent throbbing/muscular in nature -kpad  Dispo: Medically stable for discharge, awaiting ALF placement, will need HD moved.   Patient has been told we  cannot hold her indefinitely for local placement but we will try to find local.  FEN/GI: renal/carb modified,  miralax PPx: heparin  Subjective: Only physical complaint is the new minimal arm discomfort.   Main concern is not wanting to be placed in out of town facility.  Objective: Temp:  [97.8 F (36.6 C)-98.4 F (36.9 C)] 98.2 F (36.8 C) (11/08 0417) Pulse Rate:  [54-65] 60 (11/08 0417) Resp:  [16-18] 18 (11/08 0417) BP: (102-186)/(40-82) 143/82 (11/08 0417) SpO2:  [95 %-99 %] 95 % (11/08 0417) Weight:  [122 lb 5.7 oz (55.5 kg)-129 lb 3 oz (58.6 kg)] 122 lb 5.7 oz (55.5 kg) (11/07 2019) Physical Exam: General: NAD, well appearing CVS: RRR,  murmur Lungs: CTAB, no increased work of breathing Abdomen: Soft, nontender, nondistended   Laboratory: Recent Labs  Lab 02/11/17 0900 02/14/17 0730 02/16/17 0900  WBC 5.4 5.0 5.6  HGB 10.1* 9.0* 9.2*  HCT 32.3* 28.8* 29.4*  PLT 135* 115* 116*   Recent Labs  Lab 02/11/17 0950 02/14/17 0730 02/16/17 0900  NA 131* 128* 132*  K 4.5 4.8 4.7  CL 96* 92* 97*  CO2 27 26 27   BUN 45* 60* 47*  CREATININE 6.62* 7.60* 6.24*  CALCIUM 9.3 9.6 9.5  GLUCOSE 152* 135* 124*   Imaging/Diagnostic Tests: CLINICAL DATA:  Status post dialysis catheter placement  EXAM: PORTABLE CHEST 1 VIEW  COMPARISON:  02/06/2017  FINDINGS: Right-sided dialysis catheter is in place with tips in the right atrium. No pneumothorax identified. Mild cardiac enlargement. Bilateral pleural effusions and pulmonary edema are similar to previous exam  IMPRESSION: 1. No change in pulmonary edema and pleural effusions 2. No complications after right-sided dialysis catheter placement.  Sherene Sires, DO 02/17/2017, 6:27 AM PGY-1, [place] Intern pager: [phone], text pages welcome

## 2017-02-18 DIAGNOSIS — N390 Urinary tract infection, site not specified: Secondary | ICD-10-CM

## 2017-02-18 DIAGNOSIS — T83511A Infection and inflammatory reaction due to indwelling urethral catheter, initial encounter: Secondary | ICD-10-CM

## 2017-02-18 DIAGNOSIS — T83511S Infection and inflammatory reaction due to indwelling urethral catheter, sequela: Secondary | ICD-10-CM

## 2017-02-18 LAB — RENAL FUNCTION PANEL
ALBUMIN: 2.8 g/dL — AB (ref 3.5–5.0)
Anion gap: 8 (ref 5–15)
BUN: 43 mg/dL — AB (ref 6–20)
CHLORIDE: 99 mmol/L — AB (ref 101–111)
CO2: 26 mmol/L (ref 22–32)
CREATININE: 5.64 mg/dL — AB (ref 0.44–1.00)
Calcium: 10 mg/dL (ref 8.9–10.3)
GFR calc Af Amer: 9 mL/min — ABNORMAL LOW (ref >60)
GFR, EST NON AFRICAN AMERICAN: 7 mL/min — AB (ref >60)
GLUCOSE: 158 mg/dL — AB (ref 65–99)
PHOSPHORUS: 2.3 mg/dL — AB (ref 2.5–4.6)
Potassium: 4.9 mmol/L (ref 3.5–5.1)
Sodium: 133 mmol/L — ABNORMAL LOW (ref 135–145)

## 2017-02-18 LAB — CBC
HCT: 28.8 % — ABNORMAL LOW (ref 36.0–46.0)
Hemoglobin: 9.1 g/dL — ABNORMAL LOW (ref 12.0–15.0)
MCH: 30.2 pg (ref 26.0–34.0)
MCHC: 31.6 g/dL (ref 30.0–36.0)
MCV: 95.7 fL (ref 78.0–100.0)
PLATELETS: 113 10*3/uL — AB (ref 150–400)
RBC: 3.01 MIL/uL — ABNORMAL LOW (ref 3.87–5.11)
RDW: 17.3 % — AB (ref 11.5–15.5)
WBC: 5.7 10*3/uL (ref 4.0–10.5)

## 2017-02-18 LAB — GLUCOSE, CAPILLARY
GLUCOSE-CAPILLARY: 332 mg/dL — AB (ref 65–99)
GLUCOSE-CAPILLARY: 146 mg/dL — AB (ref 65–99)
Glucose-Capillary: 201 mg/dL — ABNORMAL HIGH (ref 65–99)
Glucose-Capillary: 82 mg/dL (ref 65–99)

## 2017-02-18 NOTE — Procedures (Signed)
Patient seen on Hemodialysis. QB 400, UF goal 2L Treatment adjusted as needed.  Elmarie Shiley MD [facility]. Office # [phone] Pager # [phone] 8:36 AM

## 2017-02-18 NOTE — Progress Notes (Signed)
Patient ID: Brandi Arellano, female   DOB: May 01, 1955, 61 y.o.   MRN: [id number]  [facility] Progress Note   Assessment/ Plan:   1. Cardiac stress + for ischemia/ hx remote CABG- Coronary angiogram on 11/2 showed patent bypass grafts x 3 . No interventionrequired at this point and will continuemedical management per cardiology recommendations. 2. 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   3. AMS- resolved, suspected to be iatrogenicdue to pain meds 4. ESRD - MWF HD. Catheter-dependent, notacandidate for perm access due to severe PAD. 5. HTN/ volume- Blood pressures well controlled,continueamlodipine. Volume stable, reaching EDW  6. Anemia of CKD- Hb 9.2, no overt losses, Aranesp 60 dosed 11/7 ,Tsat 61% 7. MBD of CKD- continue VDRA/phosphorus at goal on current binder dose 8. DMper primary  9. SuicidalIdeation-seen by psychiatry, no active SI , does not need anymedications 10. Dispo - awaiting placement to ALF vs SNF  Subjective:   Reports to be clinically feeling well and frustrated by problems with disposition. Re-evaluation today by supervisor from [facility].    Objective:   BP 138/65   Pulse (!) 58   Temp 98 F (36.7 C) (Oral)   Resp 18   Ht 5\' 6"  (1.676 m)   Wt 57.2 kg (126 lb 1.7 oz)   SpO2 96%   BMI 20.35 kg/m   Physical Exam: ALP:[id number] resting on dialysis HGD:JMEQA regular rhythm and normal rate, normal s1 and s2 Resp:clear bilaterally, no rales/rhonchi STM:HDQQ, flat, non tender Ext:s/p right AKA and left TMA, no edema  Labs: BMET Recent Labs  Lab 02/11/17 0950 02/14/17 0730 02/16/17 0900  NA 131* 128* 132*  K 4.5 4.8 4.7  CL 96* 92* 97*  CO2 27 26 27   GLUCOSE 152* 135* 124*  BUN 45* 60* 47*  CREATININE 6.62* 7.60* 6.24*  CALCIUM 9.3 9.6 9.5  PHOS 3.1 3.0 2.7   CBC Recent Labs  Lab 02/14/17 0730 02/16/17 0900  WBC  5.0 5.6  HGB 9.0* 9.2*  HCT 28.8* 29.4*  MCV 95.4 95.1  PLT 115* 116*   Medications:    . amLODipine  5 mg Oral Daily  . aspirin EC  81 mg Oral BID  . atorvastatin  40 mg Oral QPM  . chlorhexidine  15 mL Mouth Rinse BID  . darbepoetin (ARANESP) injection - DIALYSIS  60 mcg Intravenous Q Wed-HD  . docusate sodium  100 mg Oral Daily  . doxercalciferol  4 mcg Intravenous Q M,W,F-HD  . folic acid  1 mg Oral Daily  . gabapentin  100 mg Oral BID  . heparin  5,000 Units Subcutaneous Q8H  . insulin aspart  0-9 Units Subcutaneous Q4H  . lanthanum  1,000 mg Oral TID WC  . levothyroxine  25 mcg Oral QAC breakfast  . mouth rinse  15 mL Mouth Rinse q12n4p  . metoprolol tartrate  25 mg Oral BID  . multivitamin with minerals  1 tablet Oral Daily  . sodium chloride flush  3 mL Intravenous Q12H  . thiamine  100 mg Oral Daily   Elmarie Shiley, MD 02/18/2017, 8:28 AM

## 2017-02-18 NOTE — Progress Notes (Signed)
Patients BP 136/71 pulse 53. MD notified. Orders given to hold Metoprolol. Orders followed. Will continue to monitor.

## 2017-02-18 NOTE — Progress Notes (Signed)
Family Medicine Teaching Service Daily Progress Note Intern Pager: [phone]  Patient name: Brandi Arellano Medical record number: [id number] Date of birth: [birth year] Age: 61 y.o. Gender: female  Primary Care Provider: Kerin Perna, NP Consultants: nephrology, vascular surgery, urology, psychiatry, cardiology Code Status: partial code (DNI, thinking about DNR)  [name]:  10/28 admitted to [facility]  10/29 seen by [facility], permcath replaced, dialysis received, seen by urology 10/30 evaluated by psych 10/31 dialysis 11/01 myoview with ischemic findings 11/02 HD, cardiac catheterization 11/05 HD 11/07 HD 11/09 HD   Assessment and Plan: Turbid Urine Pt complained of urine odor and change in color. Concern for UTI given indwelling catheter. UA had several squamous cells. Urine culture pending.   Cardiac ischemia Underwent cardiac cath, no PCI targets. Medical management.  - cardiology recommendations appreciated - vital signs per floor routine - cardiac monitoring  Altered Mental Status Has resolved and patient is now aox4.   vital signs per floor routine - cardiac monitoring - tylenol for pain, fever  ESRD -Pt had severed catheter tip s/p replacement on 10/29. Had HD on 11/2. See social below. Appreciate nephrology recs, creatinine 9.5 10/30. k 4.7.  - dialysis per nephrology  Abdominal pain, Resolved.  Most likely due to neuropathic pain in legs after improvement on gabapentin. - zofran for nausea - gabapentin for neuropathic pain  Nausea and vomiting Resolved. Tolerating po. -zofran and reglan as above  Bladder Mass Patient with a bladder mass in the left posterior base. Potentially the nidus for chronic uti. Large amount of hgb on last few UAs. Urology saw on 10/29.  Receiving myoview due to mulitple cardiac risk factors, pending normal, Will perform cystoscopy as outpt at [facility] in 1-2 weeks.  Potential suicide  attempt Patient's family concerned that his may have been a suicide attempt. Patient denies any thoughts or plans of self-harm or harming others. Evaluated by psych who felt that patient was not at risk for suicide. Will follow up outpatient with psych. 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 Has had recurrent UTI. S/p treatment with vantin. Patient with foul smelling urine. No growth on UA, so cefepime stopped. Continue foley until outpatient follow for procedure with urology.  H/O UTI As above  History of CVA   Patient with right thalamic infarct appreciated on head ct. Has no residual deficits. Already on high intensity statin and aspirin as outpt. - continue atorvastatin, asa 81  Type II diabetes CBGs: 104-224 in past 24 hrs. on sSSI while inpatinet. A1c 6.8 on 10/29. - sSSI - q 4 hour checks  Hyperlipidemia Patient currently on atorvastatin. No lipid panel in system. Continue atorvastatin. Will need lipid panel drawn as outpatient by pcp. - continue atorvastatin - lipid panel at pcp follow up  Hypertension Patient with norvasc 5mg  daily at home.  - continue home norvasc - prn labetolol or hydralazine for systolic>170  Hypothyroidism Continue home synthroid 25mcg daily  Right Above Knee Amputation Patient with no skin breakdown at stump site. Has a nervous tic where she shakes this stump apparently due to phantom limb pain. Will monitor for any s/s developing ulcer.   Left transmetatarsal amputation Stump site without any abnormalities. C/D/i with no skin breakdown.  Diabetic Gastroparesis Will likely need upper GI study as outpatient. Could potentially benefit from vagal nerve stimulator. If abdominal pain does not resolve can consider upper gi while here.  Left arm  discomfort: no associated chest pain, intermittent throbbing/muscular in nature -kpad  Dispo: Medically stable for  discharge, awaiting ALF placement, will need HD moved.   Patient has been told we cannot hold her indefinitely for local placement but we will try to find local.  FEN/GI: renal/carb modified, miralax PPx: heparin  Subjective: Pt complaining of some back pain. Denies any abdominal pain, bladder, GU pain. Pt states that she still does not want to go to [facility] because it is to far away. She acknowledged that she is aware that if placement becomes available and she refuses that she will be discharged from the hospital without placement.  Objective: Temp:  [97.8 F (36.6 C)-98.4 F (36.9 C)] 98.1 F (36.7 C) (11/09 0506) Pulse Rate:  [55-62] 60 (11/09 0506) Resp:  [16-18] 17 (11/09 0506) BP: (141-160)/(58-73) 141/70 (11/09 0506) SpO2:  [95 %-98 %] 96 % (11/09 0506) Weight:  [125 lb 10.6 oz (57 kg)] 125 lb 10.6 oz (57 kg) (11/09 0506) Physical Exam: General: NAD, well appearing CVS: RRR,  murmur Lungs: CTAB, no increased work of breathing Abdomen: Soft, nontender, nondistended   Laboratory: Recent Labs  Lab 02/11/17 0900 02/14/17 0730 02/16/17 0900  WBC 5.4 5.0 5.6  HGB 10.1* 9.0* 9.2*  HCT 32.3* 28.8* 29.4*  PLT 135* 115* 116*   Recent Labs  Lab 02/11/17 0950 02/14/17 0730 02/16/17 0900  NA 131* 128* 132*  K 4.5 4.8 4.7  CL 96* 92* 97*  CO2 27 26 27   BUN 45* 60* 47*  CREATININE 6.62* 7.60* 6.24*  CALCIUM 9.3 9.6 9.5  GLUCOSE 152* 135* 124*   Imaging/Diagnostic Tests: CLINICAL DATA:  Status post dialysis catheter placement  EXAM: PORTABLE CHEST 1 VIEW  COMPARISON:  02/06/2017  FINDINGS: Right-sided dialysis catheter is in place with tips in the right atrium. No pneumothorax identified. Mild cardiac enlargement. Bilateral pleural effusions and pulmonary edema are similar to previous exam  IMPRESSION: 1. No change in pulmonary edema and pleural effusions 2. No complications after right-sided dialysis catheter placement.  Bonnita Hollow,  MD 02/18/2017, 6:06 AM PGY-1, [place] Intern pager: [phone], text pages welcome

## 2017-02-18 NOTE — Clinical Social Work Note (Addendum)
CSW continuing to work on placement for patient. She was being considered by [facility] and [facility] skilled facilities, but both declined. Patient was also being considered by [facility], but CSW advised by Glyn Ade with [facility] facility that patient is to functional with her ADL's for their facility. Patient clinical information sent out to facilities in and outside of [facility] on 11/5 and [facility] was the only facility to make a bed offer.  CSW talked with patient twice today (once before 5 and again after 5 pm) regarding [facility] skilled facility, however patient continues to adamantly refuse this facility. Ms. Azbell reasoning is that she will have to go to another dialysis center and change doctors and it is too much of a hassle and her children won't be able to come see her. Patient wants to go live with her daughter [name] who currently lives in [place], [place] and will be moving at some point (patient does not know when daughter is moving) to [place], [place]. CSW attempted to discuss with patient the difference in going to a facility in [place] and her dialysis being changed for her and social work communicating with [facility] admissions staff regarding any appointments she may have in [place] while at the facility. However patient continued to decline going to the facility and wanting to go stay with her daughter. CSW advised patient that the MD will be advised and she will be discharged very soon. Patient used CSW's phone to contact her daughter [name] [phone]) regarding her discharge. When asked, patient stated that her daughter was going to call her back, however she did not state when this would be.  Consulted with social work Surveyor, quantity, Nathaniel Man (before talking with patient a second time) regarding patient and discharge challenges and was advised that patient will need to work out a  discharge plan if she continues to refuse what is available through social work efforts.     CSW returned shortly thereafter to patient's room and was given permission to contact her daughter. Call made at 7:30 pm and daughter updated regarding patient's readiness for discharge and SW's efforts to work out an appropriate discharge plan with her mother and her refusal to discharge to a facility willing to accept her. Daughter reported  that she has a lot going on and cannot come right now and commented that her uncle handles her mom's money. CSW talked further with patient after call ended with daughter and urged patient to work with social work on a safe discharge plan for her.   Call made to patient's nurse and update provided her discharge disposition.  Latorya Bautch Givens, MSW, LCSW Licensed Clinical Social Worker [place] [phone]

## 2017-02-18 NOTE — Discharge Instructions (Addendum)
It was a pleasure caring for you during your stay! 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 You have follow up with urology regarding this and they will schedule your surgery. You will need to follow up with oncology.   Cystoscopy Cystoscopy is a procedure that is used to help diagnose and sometimes treat conditions that affect that lower urinary tract. The lower urinary tract includes the bladder and the tube that drains urine from the bladder out of the body (urethra). Cystoscopy is performed with a thin, tube-shaped instrument with a light and camera at the end (cystoscope). The cystoscope may be hard (rigid) or flexible, depending on the goal of the procedure.The cystoscope is inserted through the urethra, into the bladder. Cystoscopy may be recommended if you have:  Urinary tractinfections that keep coming back (recurring).  Blood in the urine (hematuria).  Loss of bladder control (urinary incontinence) or an overactive bladder.  Unusual cells found in a urine sample.  A blockage in the urethra.  Painful urination.  An abnormality in the bladder found during an intravenous pyelogram (IVP) or CT scan.  Cystoscopy may also be done to remove a sample of tissue to be examined under a microscope (biopsy). Tell a health care provider about:  Any allergies you have.  All medicines you are taking, including vitamins, herbs, eye drops, creams, and over-the-counter medicines.  Any problems you or family members have had with anesthetic medicines.  Any blood disorders you have.  Any surgeries you have had.  Any medical conditions you have.  Whether you are pregnant or may be pregnant. What are the risks? Generally, this is a safe procedure. However, problems may occur, including:  Infection.  Bleeding.  Allergic reactions to medicines.  Damage to other structures or organs.  What happens before the  procedure?  Ask your health care provider about: ? Changing or stopping your regular medicines. This is especially important if you are taking diabetes medicines or blood thinners. ? Taking medicines such as aspirin and ibuprofen. These medicines can thin your blood. Do not take these medicines before your procedure if your health care provider instructs you not to.  Follow instructions from your health care provider about eating or drinking restrictions.  You may be given antibiotic medicine to help prevent infection.  You may have an exam or testing, such as X-rays of the bladder, urethra, or kidneys.  You may have urine tests to check for signs of infection.  Plan to have someone take you home after the procedure. What happens during the procedure?  To reduce your risk of infection,your health care team will wash or sanitize their hands.  You will be given one or more of the following: ? A medicine to help you relax (sedative). ? A medicine to numb the area (local anesthetic).  The area around the opening of your urethra will be cleaned.  The cystoscope will be passed through your urethra into your bladder.  Germ-free (sterile)fluid will flow through the cystoscope to fill your bladder. The fluid will stretch your bladder so that your surgeon can clearly examine your bladder walls.  The cystoscope will be removed and your bladder will be emptied. The procedure may vary among health care providers and hospitals. What happens after the procedure?  You may have some soreness or pain in your abdomen and urethra. Medicines will be available to help you.  You may have some blood in your urine.  Do not drive for 24 hours if you received a sedative. This information is not intended to replace advice given to you by your health care provider. Make sure you discuss any questions you have with your health care provider. Document Released: 03/26/2000 Document Revised: 08/07/2015  Document Reviewed: 02/13/2015 Elsevier Interactive Patient Education  2017 [employer] Indwelling Urinary Catheter Care, Adult Take good care of your catheter to keep it working and to prevent problems. How to wear your catheter Attach your catheter to your leg with tape (adhesive tape) or a leg strap. Make sure it is not too tight. If you use tape, remove any bits of tape that are already on the catheter. How to wear a drainage bag You should have:  A large overnight bag.  A small leg bag.  Overnight Bag You may wear the overnight bag at any time. Always keep the bag below the level of your bladder but off the floor. When you sleep, put a clean plastic bag in a wastebasket. Then hang the bag inside the wastebasket. Leg Bag Never wear the leg bag at night. Always wear the leg bag below your knee. Keep the leg bag secure with a leg strap or tape. How to care for your skin  Clean the skin around the catheter at least once every day.  Shower every day. Do not take baths.  Put creams, lotions, or ointments on your genital area only as told by your doctor.  Do not use powders, sprays, or lotions on your genital area. How to clean your catheter and your skin 1. Wash your hands with soap and water. 2. Wet a washcloth in warm water and gentle (mild) soap. 3. Use the washcloth to clean the skin where the catheter enters your body. Clean downward and wipe away from the catheter in small circles. Do not wipe toward the catheter. 4. Pat the area dry with a clean towel. Make sure to clean off all soap. How to care for your drainage bags Empty your drainage bag when it is ?- full or at least 2-3 times a day. Replace your drainage bag once a month or sooner if it starts to smell bad or look dirty. Do not clean your drainage bag unless told by your doctor. Emptying a drainage bag  Supplies Needed  Rubbing alcohol.  Gauze pad or cotton ball.  Tape or a leg strap.  Steps 1. Wash your  hands with soap and water. 2. Separate (detach) the bag from your leg. 3. Hold the bag over the toilet or a clean container. Keep the bag below your hips and bladder. This stops pee (urine) from going back into the tube. 4. Open the pour spout at the bottom of the bag. 5. Empty the pee into the toilet or container. Do not let the pour spout touch any surface. 6. Put rubbing alcohol on a gauze pad or cotton ball. 7. Use the gauze pad or cotton ball to clean the pour spout. 8. Close the pour spout. 9. Attach the bag to your leg with tape or a leg strap. 10. Wash your hands.  Changing a drainage bag Supplies Needed  Alcohol wipes.  A clean drainage bag.  Adhesive tape or a leg strap.  Steps 1. Wash your hands with soap and water. 2. Separate the dirty bag from your leg. 3. Pinch the rubber catheter with your fingers so that pee does not spill out. 4. Separate the catheter tube from the drainage tube where these  tubes connect (at the connection valve). Do not let the tubes touch any surface. 5. Clean the end of the catheter tube with an alcohol wipe. Use a different alcohol wipe to clean the end of the drainage tube. 6. Connect the catheter tube to the drainage tube of the clean bag. 7. Attach the new bag to the leg with adhesive tape or a leg strap. 8. Wash your hands.  How to prevent infection and other problems  Never pull on your catheter or try to remove it. Pulling can damage tissue in your body.  Always wash your hands before and after touching your catheter.  If a leg strap gets wet, replace it with a dry one.  Drink enough fluids to keep your pee clear or pale yellow, or as told by your doctor.  Do not let the drainage bag or tubing touch the floor.  Wear cotton underwear.  If you are female, wipe from front to back after you poop (have a bowel movement).  Check on the catheter often to make sure it works and the tubing is not twisted. Get help if:  Your pee is  cloudy.  Your pee smells unusually bad.  Your pee is not draining into the bag.  Your tube gets clogged.  Your catheter starts to leak.  Your bladder feels full. Get help right away if:  You have redness, swelling, or pain where the catheter enters your body.  You have fluid, pus, or a bad smell coming from the area where the catheter enters your body.  The area where the catheter enters your body feels warm.  You have a fever.  You have pain in your: ? Stomach (abdomen). ? Legs. ? Lower back. ? Bladder.  You see blood fill the catheter.  Your pee is pink or red.  You feel sick to your stomach (nauseous).  You throw up (vomit).  You have chills.  Your catheter gets pulled out. This information is not intended to replace advice given to you by your health care provider. Make sure you discuss any questions you have with your health care provider. Document Released: 07/24/2012 Document Revised: 02/25/2016 Document Reviewed: 09/11/2013 Elsevier Interactive Patient Education  2018 [employer]. Suicide Resources  Who to Call  Call [place] 1-800-SUICIDE or (800) 609 656 5570)  [place] at (301) 792-6550; 912-205-4149  More Resources  Suicide Awareness Voices of Education       (781) 385-2698        www.save.[place] on Mental Illness(NAMI)       (800) 950-NAMI        www.nami.org  American Association of Suicidology       573-361-2022        www.suicidology.orgAccidental Overdose An accidental overdose happens when a person accidentally takes too much of a substance, such as a prescription medicine, an illegal drug, or an over-the-counter medicine. The effects of an overdose can be mild, dangerous, or even deadly. What are the causes? This condition is caused by taking too much of a medicine or other substance. It often results from:  Lack of knowledge about a  substance.  Using more than one substance at the same time.  An error made by the health care provider who prescribed the substance.  An error made by the pharmacist who fills the prescription order.  A lapse in memory, such as forgetting that you have already taken a dose of medicine.  Suddenly using a substance after a long period of not using it.  Substances that can cause an accidental overdose include:  Alcohol.  Medicines that treat mental problems (psychotropic medicines).  Pain medicines.  Cocaine.  Heroin.  Multivitamins that contain iron.  What increases the risk? This condition is more likely to occur in:  Children. Children are at increased risk for an overdose even if they are given only a small amount of a substance because of their small size. Children may also be attracted to colorful pills.  Elderly adults. Elderly adults are more likely to overdose because they may be taking many different medicines. They may also have difficulty reading labels or remembering when they last took their medicine.  People who use illegal drugs.  People who drink alcohol while using drugs or certain medicines.  People with certain mental health conditions.  What are the signs or symptoms? Symptoms of this condition depend on the substance and the amount that was taken. Common symptoms include:  Behavior changes, such as confusion.  Sleepiness.  Weakness.  Slowed breathing.  Nausea and vomiting.  Seizures.  Very large or small eye pupil size.  An overdose can cause a very serious condition in which your blood pressure drops to a low level (shock). Symptoms of shock include:  Cold and clammy skin.  Pale skin.  Blue lips.  Very slow breathing.  Extreme sleepiness.  Severe confusion.  How is this diagnosed? This condition is diagnosed based on your symptoms and a physical exam. You will be asked to tell your health care provider which substances you  took and when you took them. During the physical exam your health care provider may check and monitor your heart rate and rhythm, your temperature, and your blood pressure (vital signs). He or she may also check your breathing and oxygen levels. Sometimes tests are also done to diagnose the condition. Tests may include:  Urine tests. These are done to check for substances in your system.  Blood tests. These may be done to check for: ? Substances in your system. ? Signs of an imbalance in your blood minerals (electrolytes). ? Liver damage. ? Kidney damage. ? An abnormal acid level in your blood.  An electrocardiogram (ECG). This tests is done to monitor electrical activity in your heart.  How is this treated? This condition may need to be treated right away at the hospital. The first step in treatment is supporting your vital signs and your breathing. After that treatment may involve:  Getting fluids and electrolytes through an IV tube.  Having a breathing tube inserted in your airway to help you breathe. The breathing tube is called a endotracheal tube.  Getting medicines. These may include: ? Medicines that absorb any substance that is in your digestive system. ? Medicines that block or reverse the effect of the substance that caused the overdose.  Having your blood filtered through an artificial kidney machine (hemodialysis).  Ongoing counseling and mental health support. This may be provided if you used an illegal drug.  Follow these instructions at home: Medicines  Take over-the-counter and prescription medicines only as told by your health care provider.  Before taking a new medicine, ask your health care provider whether the medicine may cause side effects and whether the medicine might react with other medicines.  Keep a list of all of the medicines that you take, including over-the-counter medicines. Bring this list with you to all of your medical visits. General  instructions  Drink enough fluid to keep your urine clear or pale yellow.  If you are working with a counselor or mental health professional, make sure to follow his or her instructions.  Keep all follow-up visits as told by your health care provider. This is important.  Limit alcohol intake to no more than 1 drink a day for nonpregnant women and 2 drinks a day for men. One drink equals 12 oz of beer, 5 oz of wine, or 1 oz of hard liquor. How is this prevented?  Get help if you are struggling with: ? Alcohol or drug use. ? Depression or another mental health problem.  Keep the phone number of your local poison control center near your phone or on your cell phone. The hotline of the [facility] is 727 810 4944.  Store all medicines in safety containers that are out of the reach of children.  Read the drug inserts that come with your medicines.  Create a system for taking your medicine, such as with a pill box, that will help you avoid taking too much.  Do not drink alcohol while taking medicines unless your health care provider approves.  Do not use illegal drugs.  Do not take medicines that are not prescribed for you.  If you are breastfeeding, talk to your health care provider before taking medicines or other substances. Certain drugs and medicines can be passed through breast milk to your baby. Contact a health care provider if:  Your symptoms return.  You develop new symptoms or side effects after taking a medicine.  You have questions about a possible overdose. Call your local poison control center at 1-424-386-8420. Get help right away if:  You think that you or someone else may have taken too much of a substance.  You or someone else is having symptoms of an overdose.  You have serious thoughts about hurting yourself or others.  You become confused.  You have chest pain.  You have trouble breathing.  You lose consciousness.  You have a  seizure.  You have trouble staying awake. These symptoms may represent a serious problem that is an emergency. Do not wait to see if the symptoms will go away. Get medical help right away. Call your local emergency services (911 in the [place] Do not drive yourself to the hospital. Summary  An accidental overdose happens when a person accidentally takes too much of a medicine or other substance, such as a prescription medicine, an illegal drug, or an over-the-counter medicine.  The effects of an overdose can be mild, dangerous, or even deadly.  This condition is diagnosed based on your symptoms and a physical exam. You will be asked to tell your health care provider which substances you took and when you took them.  This condition may need to be treated right away at the hospital. This information is not intended to replace advice given to you by your health care provider. Make sure you discuss any questions you have with your health care provider. Document Released: 06/12/2004 Document Revised: 03/11/2016 Document Reviewed: 03/11/2016 Elsevier Interactive Patient Education  2017 [employer].

## 2017-02-19 LAB — GLUCOSE, CAPILLARY
Glucose-Capillary: 112 mg/dL — ABNORMAL HIGH (ref 65–99)
Glucose-Capillary: 141 mg/dL — ABNORMAL HIGH (ref 65–99)
Glucose-Capillary: 186 mg/dL — ABNORMAL HIGH (ref 65–99)
Glucose-Capillary: 181 mg/dL — ABNORMAL HIGH (ref 65–99)
Glucose-Capillary: 187 mg/dL — ABNORMAL HIGH (ref 65–99)

## 2017-02-19 MED ORDER — LANTHANUM CARBONATE 500 MG PO CHEW
1000.0000 mg | CHEWABLE_TABLET | Freq: Two times a day (BID) | ORAL | Status: DC
  Administered 2017-02-19 – 2017-02-23 (×8): 1000 mg via ORAL
  Filled 2017-02-19 (×8): qty 2

## 2017-02-19 MED ORDER — DOXERCALCIFEROL 4 MCG/2ML IV SOLN
3.0000 ug | INTRAVENOUS | Status: DC
  Administered 2017-02-21 – 2017-03-14 (×9): 3 ug via INTRAVENOUS
  Filled 2017-02-19 (×8): qty 2

## 2017-02-19 NOTE — Progress Notes (Signed)
CSW continuing to follow for discharge planning. CSW updated by MD that patient is now agreeable to [facility], due to discovering she has family in the area. However, patient will need her outpatient dialysis moved closer to that facility for the duration of her SNF stay. Patient is also scheduled to have a cystoscopy on 11/20, and the facility will need to be aware to facilitate return to the hospital for that procedure.  CSW will continue to follow.  Laveda Abbe, [place] Clinical Social Worker [phone]

## 2017-02-19 NOTE — Progress Notes (Signed)
Family Medicine Teaching Service Daily Progress Note Intern Pager: [phone]  Patient name: Brandi Arellano Medical record number: [id number] Date of birth: 1955-04-16 Age: 61 y.o. Gender: female  Primary Care Provider: Kerin Perna, NP Consultants: nephrology, vascular surgery, urology, psychiatry, cardiology Code Status: partial code (DNI, thinking about DNR)  [name]:  10/28 admitted to [facility]  10/29 seen by [facility], permcath replaced, dialysis received, seen by urology 10/30 evaluated by psych 10/31 dialysis 11/01 myoview with ischemic findings 11/02 HD, cardiac catheterization 11/05 HD 11/07 HD 11/09 HD   Assessment and Plan: Turbid Urine Pt complained of urine odor and change in color. Concern for UTI given indwelling catheter. UA had several squamous cells. Urine culture pending.   Cardiac ischemia Underwent cardiac cath, no PCI targets. Medical management.  - cardiology recommendations appreciated - vital signs per floor routine - cardiac monitoring  Altered Mental Status Has resolved and patient is now aox4.   vital signs per floor routine - cardiac monitoring - tylenol for pain, fever  ESRD -Pt had severed catheter tip s/p replacement on 10/29. Had HD on 11/2. See social below. Appreciate nephrology recs, creatinine 9.5 10/30. k 4.7.  - dialysis per nephrology  Abdominal pain, Resolved.  Most likely due to neuropathic pain in legs after improvement on gabapentin. - zofran for nausea - gabapentin for neuropathic pain  Nausea and vomiting Resolved. Tolerating po. -zofran and reglan as above  Bladder Mass Patient with a bladder mass in the left posterior base. Potentially the nidus for chronic uti. Large amount of hgb on last few UAs. Urology saw on 10/29.  Receiving myoview due to mulitple cardiac risk factors, pending normal, Will perform cystoscopy as outpt at [place] [facility] in 1-2 weeks.  Potential suicide  attempt Patient's family concerned that his may have been a suicide attempt. Patient denies any thoughts or plans of self-harm or harming others. Evaluated by psych who felt that patient was not at risk for suicide. Will follow up outpatient with psych. 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 Has had recurrent UTI. S/p treatment with vantin. Patient with foul smelling urine. No growth on UA, so cefepime stopped. Continue foley until outpatient follow for procedure with urology.  H/O UTI As above  History of CVA   Patient with right thalamic infarct appreciated on head ct. Has no residual deficits. Already on high intensity statin and aspirin as outpt. - continue atorvastatin, asa 81  Type II diabetes CBGs: 104-224 in past 24 hrs. on sSSI while inpatinet. A1c 6.8 on 10/29. - sSSI - q 4 hour checks  Hyperlipidemia Patient currently on atorvastatin. No lipid panel in system. Continue atorvastatin. Will need lipid panel drawn as outpatient by pcp. - continue atorvastatin - lipid panel at pcp follow up  Hypertension Patient with norvasc 5mg  daily at home.  - continue home norvasc - prn labetolol or hydralazine for systolic>170  Hypothyroidism Continue home synthroid 56mcg daily  Right Above Knee Amputation Patient with no skin breakdown at stump site. Has a nervous tic where she shakes this stump apparently due to phantom limb pain. Will monitor for any s/s developing ulcer.   Left transmetatarsal amputation Stump site without any abnormalities. C/D/i with no skin breakdown.  Diabetic Gastroparesis Will likely need upper GI study as outpatient. Could potentially benefit from vagal nerve stimulator. If abdominal pain does not resolve can consider upper gi while here.  Left arm  discomfort: no associated chest pain, intermittent throbbing/muscular in nature -kpad  Dispo: Medically stable for  discharge, awaiting ALF placement, will need HD moved.   Pt says she will now go to facility in [place] because she has learned that she has family nearby.  FEN/GI: renal/carb modified, miralax PPx: heparin  Subjective: Pt has no complaints. She is happy that she has family near the ALF that she has qualified for and is willing to go.   Objective: Temp:  [97.7 F (36.5 C)-98.4 F (36.9 C)] 98.2 F (36.8 C) (11/10 0836) Pulse Rate:  [53-65] 65 (11/10 0836) Resp:  [16-18] 18 (11/10 0836) BP: (104-145)/(54-80) 145/80 (11/10 0836) SpO2:  [94 %-97 %] 94 % (11/10 0836) Physical Exam: General: NAD, well appearing CVS: RRR,  murmur Lungs: CTAB, no increased work of breathing Abdomen: Soft, nontender, nondistended   Laboratory: Recent Labs  Lab 02/14/17 0730 02/16/17 0900 02/18/17 0843  WBC 5.0 5.6 5.7  HGB 9.0* 9.2* 9.1*  HCT 28.8* 29.4* 28.8*  PLT 115* 116* 113*   Recent Labs  Lab 02/14/17 0730 02/16/17 0900 02/18/17 0844  NA 128* 132* 133*  K 4.8 4.7 4.9  CL 92* 97* 99*  CO2 26 27 26   BUN 60* 47* 43*  CREATININE 7.60* 6.24* 5.64*  CALCIUM 9.6 9.5 10.0  GLUCOSE 135* 124* 158*   Imaging/Diagnostic Tests: CLINICAL DATA:  Status post dialysis catheter placement  EXAM: PORTABLE CHEST 1 VIEW  COMPARISON:  02/06/2017  FINDINGS: Right-sided dialysis catheter is in place with tips in the right atrium. No pneumothorax identified. Mild cardiac enlargement. Bilateral pleural effusions and pulmonary edema are similar to previous exam  IMPRESSION: 1. No change in pulmonary edema and pleural effusions 2. No complications after right-sided dialysis catheter placement.  Bonnita Hollow, MD 02/19/2017, 9:30 AM PGY-1, [place] Intern pager: [phone], text pages welcome

## 2017-02-19 NOTE — Progress Notes (Signed)
[facility] Progress Note   Subjective: Awake, alert, oriented X 3. Says she has decided to go to SNF however has been declined admit to [facility] and [facility]. No C/O abdominal pain. C/O of malodorous urine. Appear back to baseline.   Objective Vitals:   02/18/17 1135 02/18/17 1706 02/18/17 2130 02/19/17 0429  BP: 136/71 131/68 (!) 122/54 104/70  Pulse: (!) 53 (!) 56 64 (!) 57  Resp: 16 18 18 17   Temp: 97.7 F (36.5 C) 98 F (36.7 C) 98.4 F (36.9 C) 98.3 F (36.8 C)  TempSrc: Oral Oral    SpO2: 96% 96% 94% 97%  Weight:      Height:       Physical Exam General: Pleasant, cooperative, NAD Heart: K2,H0 3/6 systolic M. No JVD Lungs: CTAB A/P Abdomen: Non-tender to deep palpation. Foley in place with muddy colored urine.  Extremities: No LE edema. No stump edema. R BKA, Left TMA.  Dialysis Access: RIJ [facility] Drsg CDI   Additional Objective Labs: Basic Metabolic Panel: Recent Labs  Lab 02/14/17 0730 02/16/17 0900 02/18/17 0844  NA 128* 132* 133*  K 4.8 4.7 4.9  CL 92* 97* 99*  CO2 26 27 26   GLUCOSE 135* 124* 158*  BUN 60* 47* 43*  CREATININE 7.60* 6.24* 5.64*  CALCIUM 9.6 9.5 10.0  PHOS 3.0 2.7 2.3*   Liver Function Tests: Recent Labs  Lab 02/14/17 0730 02/16/17 0900 02/18/17 0844  ALBUMIN 2.7* 2.6* 2.8*   No results for input(s): LIPASE, AMYLASE in the last 168 hours. CBC: Recent Labs  Lab 02/14/17 0730 02/16/17 0900 02/18/17 0843  WBC 5.0 5.6 5.7  HGB 9.0* 9.2* 9.1*  HCT 28.8* 29.4* 28.8*  MCV 95.4 95.1 95.7  PLT 115* 116* 113*   Blood Culture    Component Value Date/Time   SDES URINE, RANDOM 02/07/2017 0138   SPECREQUEST NONE 02/07/2017 0138   CULT NO GROWTH 02/07/2017 0138   REPTSTATUS 02/08/2017 FINAL 02/07/2017 0138    Cardiac Enzymes: No results for input(s): CKTOTAL, CKMB, CKMBINDEX, TROPONINI in the last 168 hours. CBG: Recent Labs  Lab 02/18/17 1207 02/18/17 1635 02/18/17 2127 02/18/17 2351 02/19/17 0424   GLUCAP 82 332* 146* 201* 112*   Iron Studies:  Recent Labs    02/16/17 0900  IRON 123  TIBC 200*  FERRITIN 1,388*   @lablastinr3 @ Studies/Results: No results found. Medications:  . amLODipine  5 mg Oral Daily  . aspirin EC  81 mg Oral BID  . atorvastatin  40 mg Oral QPM  . chlorhexidine  15 mL Mouth Rinse BID  . darbepoetin (ARANESP) injection - DIALYSIS  60 mcg Intravenous Q Wed-HD  . docusate sodium  100 mg Oral Daily  . doxercalciferol  4 mcg Intravenous Q M,W,F-HD  . folic acid  1 mg Oral Daily  . gabapentin  100 mg Oral BID  . heparin  5,000 Units Subcutaneous Q8H  . insulin aspart  0-9 Units Subcutaneous Q4H  . lanthanum  1,000 mg Oral TID WC  . levothyroxine  25 mcg Oral QAC breakfast  . mouth rinse  15 mL Mouth Rinse q12n4p  . metoprolol tartrate  25 mg Oral BID  . multivitamin with minerals  1 tablet Oral Daily  . sodium chloride flush  3 mL Intravenous Q12H  . thiamine  100 mg Oral Daily   Dialysis Orders:  [place] MWF 4h 56.5kg 2/2.25 Hep 4000 R IJ TDC (new one placed 10/29) -hect 4 ug tiw -BMM: Fosrenol 1000mg  2  tabs tid   Assessment/Plan: 1. Cardiac stress + for ischemia/ hx remote CABG- Coronary angiogram on 11/2 showed patent bypass grafts x 3 . No interventionrequired at this point and will continuemedical management per cardiology recommendations. 2. 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 No pain reported today.  3. AMS- resolved, suspected to be iatrogenicdue to pain meds 4. ESRD - MWF HD. Catheter-dependent, notacandidate for perm access due to severe PAD. HD yesterday on schedule.  5. HTN/ volume- Blood pressures well controlled,continueamlodipine. Volume stable, reaching EDW  6. Anemia of CKD- Hb 9.1. Aranesp 60 dosed 11/7 ,Tsat 61%. Follow HGB.  7. MBD of CKD- continue VDRA/Phos 2.3 (hospital diet) decrease fosrenol to 1 gram BID  with meals. Ca 10.0 C Ca 10.7 decrease hectoral 3 mcg TIW 8. DMper primary  9. SuicidalIdeation-seen by psychiatry, no active SI , does not need anymedications 10. Dispo - awaiting placement to [facility] SNF    Rita H. Brown NP-C 02/19/2017, 8:03 AM  [employer] [phone]

## 2017-02-20 LAB — URINE CULTURE: CULTURE: NO GROWTH

## 2017-02-20 LAB — GLUCOSE, CAPILLARY
GLUCOSE-CAPILLARY: 96 mg/dL (ref 65–99)
GLUCOSE-CAPILLARY: 156 mg/dL — AB (ref 65–99)
GLUCOSE-CAPILLARY: 195 mg/dL — AB (ref 65–99)
GLUCOSE-CAPILLARY: 248 mg/dL — AB (ref 65–99)
Glucose-Capillary: 182 mg/dL — ABNORMAL HIGH (ref 65–99)
Glucose-Capillary: 119 mg/dL — ABNORMAL HIGH (ref 65–99)

## 2017-02-20 MED ORDER — INSULIN ASPART 100 UNIT/ML ~~LOC~~ SOLN
0.0000 [IU] | Freq: Three times a day (TID) | SUBCUTANEOUS | Status: DC
  Administered 2017-02-21: 3 [IU] via SUBCUTANEOUS
  Administered 2017-02-21: 2 [IU] via SUBCUTANEOUS
  Administered 2017-02-22 – 2017-02-23 (×4): 1 [IU] via SUBCUTANEOUS
  Administered 2017-02-23: 2 [IU] via SUBCUTANEOUS
  Administered 2017-02-24: 1 [IU] via SUBCUTANEOUS
  Administered 2017-02-24: 3 [IU] via SUBCUTANEOUS
  Administered 2017-02-24 – 2017-02-27 (×7): 2 [IU] via SUBCUTANEOUS
  Administered 2017-02-27: 3 [IU] via SUBCUTANEOUS
  Administered 2017-02-27: 5 [IU] via SUBCUTANEOUS
  Administered 2017-02-28: 2 [IU] via SUBCUTANEOUS
  Administered 2017-02-28: 3 [IU] via SUBCUTANEOUS
  Administered 2017-02-28 – 2017-03-01 (×2): 2 [IU] via SUBCUTANEOUS
  Administered 2017-03-01: 1 [IU] via SUBCUTANEOUS
  Administered 2017-03-02: 2 [IU] via SUBCUTANEOUS
  Administered 2017-03-03: 3 [IU] via SUBCUTANEOUS
  Administered 2017-03-03: 2 [IU] via SUBCUTANEOUS
  Administered 2017-03-03: 3 [IU] via SUBCUTANEOUS
  Administered 2017-03-03: 2 [IU] via SUBCUTANEOUS
  Administered 2017-03-04: 1 [IU] via SUBCUTANEOUS
  Administered 2017-03-04: 5 [IU] via SUBCUTANEOUS
  Administered 2017-03-05: 2 [IU] via SUBCUTANEOUS
  Administered 2017-03-05 – 2017-03-06 (×3): 1 [IU] via SUBCUTANEOUS
  Administered 2017-03-06: 2 [IU] via SUBCUTANEOUS
  Administered 2017-03-06: 1 [IU] via SUBCUTANEOUS
  Administered 2017-03-06: 7 [IU] via SUBCUTANEOUS
  Administered 2017-03-07 – 2017-03-08 (×2): 2 [IU] via SUBCUTANEOUS
  Administered 2017-03-08: 3 [IU] via SUBCUTANEOUS
  Administered 2017-03-08: 2 [IU] via SUBCUTANEOUS
  Administered 2017-03-08: 3 [IU] via SUBCUTANEOUS
  Administered 2017-03-09 (×3): 2 [IU] via SUBCUTANEOUS
  Administered 2017-03-10: 1 [IU] via SUBCUTANEOUS
  Administered 2017-03-10: 2 [IU] via SUBCUTANEOUS
  Administered 2017-03-10: 1 [IU] via SUBCUTANEOUS
  Administered 2017-03-10: 2 [IU] via SUBCUTANEOUS
  Administered 2017-03-11: 3 [IU] via SUBCUTANEOUS
  Administered 2017-03-11 (×2): 2 [IU] via SUBCUTANEOUS
  Administered 2017-03-12 (×2): 1 [IU] via SUBCUTANEOUS
  Administered 2017-03-12: 5 [IU] via SUBCUTANEOUS
  Administered 2017-03-13: 1 [IU] via SUBCUTANEOUS
  Administered 2017-03-13: 3 [IU] via SUBCUTANEOUS
  Administered 2017-03-13 (×2): 1 [IU] via SUBCUTANEOUS
  Administered 2017-03-14: 2 [IU] via SUBCUTANEOUS
  Administered 2017-03-14: 5 [IU] via SUBCUTANEOUS

## 2017-02-20 NOTE — Progress Notes (Signed)
[facility] Progress Note   Subjective: Awake, Alert, back to baseline. No new C/Os. Says she is going to [facility] and seems accepting of this.   Objective Vitals:   02/19/17 2216 02/19/17 2324 02/20/17 0453 02/20/17 0845  BP: (!) 82/42 122/65 (!) 151/70 (!) 178/70  Pulse: (!) 50  66 69  Resp: 18  16 18   Temp: 98.1 F (36.7 C)  98.3 F (36.8 C) 98.4 F (36.9 C)  TempSrc: Oral  Oral Oral  SpO2: 94%  93% 98%  Weight:      Height:       Physical Exam General: Awake, Alert, NAD Heart: Q7,Y1 3/6 systolic M Lungs: CTAB A/P Abdomen: Active BS. Urinary catheter in place with muddy colored malodorous urine.  Extremities: No LE edema Dialysis Access: RIJ [facility] Drsg CDI.    Additional Objective Labs: Basic Metabolic Panel: Recent Labs  Lab 02/14/17 0730 02/16/17 0900 02/18/17 0844  NA 128* 132* 133*  K 4.8 4.7 4.9  CL 92* 97* 99*  CO2 26 27 26   GLUCOSE 135* 124* 158*  BUN 60* 47* 43*  CREATININE 7.60* 6.24* 5.64*  CALCIUM 9.6 9.5 10.0  PHOS 3.0 2.7 2.3*   Liver Function Tests: Recent Labs  Lab 02/14/17 0730 02/16/17 0900 02/18/17 0844  ALBUMIN 2.7* 2.6* 2.8*   No results for input(s): LIPASE, AMYLASE in the last 168 hours. CBC: Recent Labs  Lab 02/14/17 0730 02/16/17 0900 02/18/17 0843  WBC 5.0 5.6 5.7  HGB 9.0* 9.2* 9.1*  HCT 28.8* 29.4* 28.8*  MCV 95.4 95.1 95.7  PLT 115* 116* 113*   Blood Culture    Component Value Date/Time   SDES URINE, CATHETERIZED 02/18/2017 1448   SPECREQUEST NONE 02/18/2017 1448   CULT NO GROWTH 02/18/2017 1448   REPTSTATUS PENDING 02/18/2017 1448    Cardiac Enzymes: No results for input(s): CKTOTAL, CKMB, CKMBINDEX, TROPONINI in the last 168 hours. CBG: Recent Labs  Lab 02/19/17 1709 02/19/17 1948 02/20/17 0115 02/20/17 0452 02/20/17 0804  GLUCAP 181* 187* 182* 96 156*   Iron Studies: No results for input(s): IRON, TIBC, TRANSFERRIN, FERRITIN in the last 72  hours. @lablastinr3 @ Studies/Results: No results found. Medications:  . amLODipine  5 mg Oral Daily  . aspirin EC  81 mg Oral BID  . atorvastatin  40 mg Oral QPM  . chlorhexidine  15 mL Mouth Rinse BID  . darbepoetin (ARANESP) injection - DIALYSIS  60 mcg Intravenous Q Wed-HD  . docusate sodium  100 mg Oral Daily  . [START ON 02/21/2017] doxercalciferol  3 mcg Intravenous Q M,W,F-HD  . folic acid  1 mg Oral Daily  . gabapentin  100 mg Oral BID  . heparin  5,000 Units Subcutaneous Q8H  . insulin aspart  0-9 Units Subcutaneous Q4H  . lanthanum  1,000 mg Oral BID WC  . levothyroxine  25 mcg Oral QAC breakfast  . mouth rinse  15 mL Mouth Rinse q12n4p  . metoprolol tartrate  25 mg Oral BID  . multivitamin with minerals  1 tablet Oral Daily  . sodium chloride flush  3 mL Intravenous Q12H  . thiamine  100 mg Oral Daily     Dialysis Orders:  [place] MWF 4h 56.5kg 2/2.25 Hep 4000 R IJ TDC (new one placed 10/29) -hect 4 ug tiw -BMM: Fosrenol 1000mg  2 tabs tid   Assessment/Plan: 1. Cardiac stress + for ischemia/ hx remote CABG- Coronary angiogram on11/2 showedpatent bypass grafts x 3 . No interventionrequired at this point and  will continuemedicalmanagement per cardiology recommendations. 2. Bladder mass withvoiding difficulty/ chronic indwelling foley- Plans noted from note by Dr.Wrenn yesterday for cystoscopy on 11/20.No pain reported today.  3. AMS- resolved, suspected to be iatrogenicdue to pain meds 4. ESRD - MWF HD. Catheter-dependent, notacandidate for perm access due to severe PAD. Will enter orders for HD for tomorrow AM in case she is not DC'd later today.  5. HTN/ volume- Blood pressures well controlled,continueamlodipine. Volume stable, reaching EDW  6. Anemia of CKD- Hb 9.1. Aranesp 60 dosed 11/7 ,Tsat 61%. Follow HGB.  7. MBD of CKD- continue VDRA/Phos 2.3 (hospital diet) decrease fosrenol to 1 gram BID with meals. Ca 10.0 C Ca 10.7 decrease  hectoral 3 mcg TIW 8. DMper primary  9. SuicidalIdeation-seen by psychiatry, no active SI , does not need anymedications 10. Possible UTI: urine cx no growth. Per primary.   Dispo - awaiting placement to [facility] SNF   Rita H. Brown NP-C 02/20/2017, 8:56 AM  [employer] [phone]

## 2017-02-20 NOTE — Progress Notes (Signed)
Family Medicine Teaching Service Daily Progress Note Intern Pager: [phone]  Patient name: Brandi Arellano Medical record number: [id number] Date of birth: [birth year] Age: 61 y.o. Gender: female  Primary Care Provider: Kerin Perna, NP Consultants: nephrology, vascular surgery, urology, psychiatry, cardiology Code Status: partial code (DNI, thinking about DNR)  [name]:  10/28 admitted to fpts  10/29 seen by vvs, permcath replaced, dialysis received, seen by urology 10/30 evaluated by psych 10/31 dialysis 11/01 myoview with ischemic findings 11/02 HD, cardiac catheterization 11/05 HD 11/07 HD 11/09 HD   Assessment and Plan: Turbid Urine Pt complained of urine odor and change in color. Concern for UTI given indwelling catheter. UA had several squamous cells. Urine culture NGTD.   Cardiac ischemia Underwent cardiac cath, no PCI targets. Medical management.  - cardiology recommendations appreciated - vital signs per floor routine - cardiac monitoring  Altered Mental Status Has resolved and patient is now aox4.   vital signs per floor routine - cardiac monitoring - tylenol for pain, fever  ESRD -Pt had severed catheter tip s/p replacement on 10/29. Had HD on 11/2. See social below. Appreciate nephrology recs, creatinine 9.5 10/30. k 4.7.  - dialysis per nephrology  Abdominal pain, Resolved.  Most likely due to neuropathic pain in legs after improvement on gabapentin. - zofran for nausea - gabapentin for neuropathic pain  Nausea and vomiting Resolved. Tolerating po. -zofran and reglan as above  Bladder Mass Patient with a bladder mass in the left posterior base. Potentially the nidus for chronic uti. Large amount of hgb on last few UAs. Urology saw on 10/29.  Receiving myoview due to mulitple cardiac risk factors, pending normal, Will perform cystoscopy as outpt at [place] [facility] in 1-2 weeks.  Potential suicide attempt Patient's  family concerned that his may have been a suicide attempt. Patient denies any thoughts or plans of self-harm or harming others. Evaluated by psych who felt that patient was not at risk for suicide. Will follow up outpatient with psych. 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 Has had recurrent UTI. S/p treatment with vantin. Patient with foul smelling urine. No growth on UA, so cefepime stopped. Continue foley until outpatient follow for procedure with urology.  H/O UTI As above  History of CVA   Patient with right thalamic infarct appreciated on head ct. Has no residual deficits. Already on high intensity statin and aspirin as outpt. - continue atorvastatin, asa 81  Type II diabetes CBGs: 104-224 in past 24 hrs. on sSSI while inpatinet. A1c 6.8 on 10/29. - sSSI - q 4 hour checks  Hyperlipidemia Patient currently on atorvastatin. No lipid panel in system. Continue atorvastatin. Will need lipid panel drawn as outpatient by pcp. - continue atorvastatin - lipid panel at pcp follow up  Hypertension Patient with norvasc 5mg  daily at home.  - continue home norvasc - prn labetolol or hydralazine for systolic>170  Hypothyroidism Continue home synthroid 36mcg daily  Right Above Knee Amputation Patient with no skin breakdown at stump site. Has a nervous tic where she shakes this stump apparently due to phantom limb pain. Will monitor for any s/s developing ulcer.   Left transmetatarsal amputation Stump site without any abnormalities. C/D/i with no skin breakdown.  Diabetic Gastroparesis Will likely need upper GI study as outpatient. Could potentially benefit from vagal nerve stimulator. If abdominal pain does not resolve can consider upper gi while here.  Left arm  discomfort: no associated chest pain, intermittent throbbing/muscular in nature -kpad  Dispo: Medically stable for discharge, awaiting  ALF placement, will need HD moved.   Pt says she will now go to facility in [place] because she has learned that she has family nearby.  FEN/GI: renal/carb modified, miralax PPx: heparin  Subjective:  Feels well today. No complaints.   Objective: Temp:  [98.1 F (36.7 C)-98.3 F (36.8 C)] 98.3 F (36.8 C) (11/11 0453) Pulse Rate:  [50-66] 66 (11/11 0453) Resp:  [16-18] 16 (11/11 0453) BP: (82-151)/(42-70) 151/70 (11/11 0453) SpO2:  [93 %-96 %] 93 % (11/11 0453) Physical Exam: General: NAD, well appearing CVS: RRR,  murmur Lungs: CTAB, no increased work of breathing Abdomen: Soft, nontender, nondistended   Laboratory: Recent Labs  Lab 02/14/17 0730 02/16/17 0900 02/18/17 0843  WBC 5.0 5.6 5.7  HGB 9.0* 9.2* 9.1*  HCT 28.8* 29.4* 28.8*  PLT 115* 116* 113*   Recent Labs  Lab 02/14/17 0730 02/16/17 0900 02/18/17 0844  NA 128* 132* 133*  K 4.8 4.7 4.9  CL 92* 97* 99*  CO2 26 27 26   BUN 60* 47* 43*  CREATININE 7.60* 6.24* 5.64*  CALCIUM 9.6 9.5 10.0  GLUCOSE 135* 124* 158*   Imaging/Diagnostic Tests: CLINICAL DATA:  Status post dialysis catheter placement  EXAM: PORTABLE CHEST 1 VIEW  COMPARISON:  02/06/2017  FINDINGS: Right-sided dialysis catheter is in place with tips in the right atrium. No pneumothorax identified. Mild cardiac enlargement. Bilateral pleural effusions and pulmonary edema are similar to previous exam  IMPRESSION: 1. No change in pulmonary edema and pleural effusions 2. No complications after right-sided dialysis catheter placement.  Bonnita Hollow, MD 02/20/2017, 8:38 AM PGY-1, [place] Intern pager: [phone], text pages welcome

## 2017-02-21 DIAGNOSIS — T83511D Infection and inflammatory reaction due to indwelling urethral catheter, subsequent encounter: Secondary | ICD-10-CM

## 2017-02-21 LAB — RENAL FUNCTION PANEL
ANION GAP: 10 (ref 5–15)
Albumin: 2.9 g/dL — ABNORMAL LOW (ref 3.5–5.0)
BUN: 67 mg/dL — ABNORMAL HIGH (ref 6–20)
CHLORIDE: 97 mmol/L — AB (ref 101–111)
CO2: 25 mmol/L (ref 22–32)
Calcium: 9.9 mg/dL (ref 8.9–10.3)
Creatinine, Ser: 7.42 mg/dL — ABNORMAL HIGH (ref 0.44–1.00)
GFR calc non Af Amer: 5 mL/min — ABNORMAL LOW (ref >60)
GFR, EST AFRICAN AMERICAN: 6 mL/min — AB (ref >60)
GLUCOSE: 155 mg/dL — AB (ref 65–99)
POTASSIUM: 5.1 mmol/L (ref 3.5–5.1)
Phosphorus: 2.1 mg/dL — ABNORMAL LOW (ref 2.5–4.6)
Sodium: 132 mmol/L — ABNORMAL LOW (ref 135–145)

## 2017-02-21 LAB — CBC
HCT: 29.8 % — ABNORMAL LOW (ref 36.0–46.0)
Hemoglobin: 9.3 g/dL — ABNORMAL LOW (ref 12.0–15.0)
MCH: 29.9 pg (ref 26.0–34.0)
MCHC: 31.2 g/dL (ref 30.0–36.0)
MCV: 95.8 fL (ref 78.0–100.0)
Platelets: 148 10*3/uL — ABNORMAL LOW (ref 150–400)
RBC: 3.11 MIL/uL — ABNORMAL LOW (ref 3.87–5.11)
RDW: 17.8 % — ABNORMAL HIGH (ref 11.5–15.5)
WBC: 6.6 10*3/uL (ref 4.0–10.5)

## 2017-02-21 LAB — GLUCOSE, CAPILLARY
GLUCOSE-CAPILLARY: 203 mg/dL — AB (ref 65–99)
GLUCOSE-CAPILLARY: 113 mg/dL — AB (ref 65–99)
Glucose-Capillary: 154 mg/dL — ABNORMAL HIGH (ref 65–99)
Glucose-Capillary: 177 mg/dL — ABNORMAL HIGH (ref 65–99)

## 2017-02-21 MED ORDER — LIDOCAINE HCL (PF) 1 % IJ SOLN: 5.0000 mL | INTRAMUSCULAR | Status: DC | PRN

## 2017-02-21 MED ORDER — HEPARIN SODIUM (PORCINE) 1000 UNIT/ML DIALYSIS: 1000.0000 [IU] | INTRAMUSCULAR | Status: DC | PRN

## 2017-02-21 MED ORDER — SODIUM CHLORIDE 0.9 % IV SOLN: 100.0000 mL | INTRAVENOUS | Status: DC | PRN

## 2017-02-21 MED ORDER — DOXERCALCIFEROL 4 MCG/2ML IV SOLN
INTRAVENOUS | Status: AC
  Administered 2017-02-21: 3 ug via INTRAVENOUS
  Filled 2017-02-21: qty 2

## 2017-02-21 MED ORDER — HEPARIN SODIUM (PORCINE) 1000 UNIT/ML DIALYSIS: 4000.0000 [IU] | Freq: Once | INTRAMUSCULAR | Status: DC

## 2017-02-21 MED ORDER — PENTAFLUOROPROP-TETRAFLUOROETH EX AERO: 1.0000 "application " | INHALATION_SPRAY | CUTANEOUS | Status: DC | PRN

## 2017-02-21 MED ORDER — LIDOCAINE-PRILOCAINE 2.5-2.5 % EX CREA: 1.0000 "application " | TOPICAL_CREAM | CUTANEOUS | Status: DC | PRN

## 2017-02-21 MED ORDER — ALTEPLASE 2 MG IJ SOLR: 2.0000 mg | Freq: Once | INTRAMUSCULAR | Status: DC | PRN

## 2017-02-21 NOTE — Clinical Social Work Note (Signed)
Contact made with Chrys Racer with [facility] regarding patient discharging to their facility once dialysis center changed. Chrys Racer requested that patient be set-up at [facility]. CSW also advised that they have limited female rehab beds. CSW talked with Freda Munro in [place] regarding patient's dialysis center being changed to [facility]. And per Freda Munro, process will be imitated and CSW will be advised when transfer complete. CSW will follow-up with dialysis center staff on Tuesday regarding patient's transfer to [facility].  Riya Huxford Givens, MSW, LCSW Licensed Clinical Social Worker [place] [phone]

## 2017-02-21 NOTE — Progress Notes (Signed)
Family Medicine Teaching Service Daily Progress Note Intern Pager: [phone]  Patient name: Brandi Arellano Medical record number: [id number] Date of birth: [birth year] Age: 60 y.o. Gender: female  Primary Care Provider: Kerin Perna, NP Consultants: nephrology, vascular surgery, urology, psychiatry, cardiology Code Status: partial code (DNI, thinking about DNR)  [name]:  10/28 admitted to fpts  10/29 seen by vvs, permcath replaced, dialysis received, seen by urology 10/30 evaluated by psych 10/31 dialysis 11/01 myoview with ischemic findings 11/02 HD, cardiac catheterization 11/05 HD 11/07 HD 11/09 HD  11/12 HD   Assessment and Plan: Turbid Urine Pt complained of urine odor and change in color. Concern for UTI given indwelling catheter. UA had several squamous cells. Urine culture NGTD.   Cardiac ischemia Underwent cardiac cath, no PCI targets. Medical management.  - cardiology recommendations appreciated - vital signs per floor routine - cardiac monitoring  Altered Mental Status Has resolved and patient is now aox4.   vital signs per floor routine - cardiac monitoring - tylenol for pain, fever  ESRD -Pt had severed catheter tip s/p replacement on 10/29. Had HD on 11/2. See social below. Appreciate nephrology recs, creatinine 9.5 10/30. k 4.7.  - dialysis per nephrology  Abdominal pain, Resolved.  Most likely due to neuropathic pain in legs after improvement on gabapentin. - zofran for nausea - gabapentin for neuropathic pain  Nausea and vomiting Resolved. Tolerating po. -zofran and reglan as above  Bladder Mass Patient with a bladder mass in the left posterior base. Potentially the nidus for chronic uti. Large amount of hgb on last few UAs. Urology saw on 10/29.  Receiving myoview due to mulitple cardiac risk factors, pending normal, Will perform cystoscopy as outpt at [place] [facility] in 1-2 weeks.  Potential suicide  attempt Patient's family concerned that his may have been a suicide attempt. Patient denies any thoughts or plans of self-harm or harming others. Evaluated by psych who felt that patient was not at risk for suicide. Will follow up outpatient with psych. 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 Has had recurrent UTI. S/p treatment with vantin. Patient with foul smelling urine. No growth on UA, so cefepime stopped. Continue foley until outpatient follow for procedure with urology.  H/O UTI As above  History of CVA   Patient with right thalamic infarct appreciated on head ct. Has no residual deficits. Already on high intensity statin and aspirin as outpt. - continue atorvastatin, asa 81  Type II diabetes CBGs: 104-224 in past 24 hrs. on sSSI while inpatinet. A1c 6.8 on 10/29. - sSSI - q 4 hour checks  Hyperlipidemia Patient currently on atorvastatin. No lipid panel in system. Continue atorvastatin. Will need lipid panel drawn as outpatient by pcp. - continue atorvastatin - lipid panel at pcp follow up  Hypertension Patient with norvasc 5mg  daily at home.  - continue home norvasc - prn labetolol or hydralazine for systolic>170  Hypothyroidism Continue home synthroid 31mcg daily  Right Above Knee Amputation Patient with no skin breakdown at stump site. Has a nervous tic where she shakes this stump apparently due to phantom limb pain. Will monitor for any s/s developing ulcer.   Left transmetatarsal amputation Stump site without any abnormalities. C/D/i with no skin breakdown.  Diabetic Gastroparesis Will likely need upper GI study as outpatient. Could potentially benefit from vagal nerve stimulator. If abdominal pain does not resolve can consider upper gi while here.  Left arm discomfort: no associated chest pain, intermittent throbbing/muscular in nature -kpad  Dispo: Medically stable for  discharge, awaiting ALF placement, will need HD moved.   Pt says she will now go to facility in [place] because she has learned that she has family nearby.  FEN/GI: renal/carb modified, miralax PPx: heparin  Subjective:  Feels well today. No complaints.   Objective: Temp:  [97.8 F (36.6 C)-98.2 F (36.8 C)] 98.1 F (36.7 C) (11/12 1325) Pulse Rate:  [57-68] 62 (11/12 1325) Resp:  [12-18] 12 (11/12 1325) BP: (110-192)/(58-92) 165/60 (11/12 1325) SpO2:  [95 %-98 %] 96 % (11/12 1325) Weight:  [118 lb 9.7 oz (53.8 kg)-125 lb 7.1 oz (56.9 kg)] 118 lb 9.7 oz (53.8 kg) (11/12 1239) Physical Exam: General: NAD, well appearing CVS: RRR,  murmur Lungs: CTAB, no increased work of breathing Abdomen: Soft, nontender, nondistended   Laboratory: Recent Labs  Lab 02/16/17 0900 02/18/17 0843 02/21/17 0900  WBC 5.6 5.7 6.6  HGB 9.2* 9.1* 9.3*  HCT 29.4* 28.8* 29.8*  PLT 116* 113* 148*   Recent Labs  Lab 02/16/17 0900 02/18/17 0844 02/21/17 0900  NA 132* 133* 132*  K 4.7 4.9 5.1  CL 97* 99* 97*  CO2 27 26 25   BUN 47* 43* 67*  CREATININE 6.24* 5.64* 7.42*  CALCIUM 9.5 10.0 9.9  GLUCOSE 124* 158* 155*   Imaging/Diagnostic Tests: CLINICAL DATA:  Status post dialysis catheter placement  EXAM: PORTABLE CHEST 1 VIEW  COMPARISON:  02/06/2017  FINDINGS: Right-sided dialysis catheter is in place with tips in the right atrium. No pneumothorax identified. Mild cardiac enlargement. Bilateral pleural effusions and pulmonary edema are similar to previous exam  IMPRESSION: 1. No change in pulmonary edema and pleural effusions 2. No complications after right-sided dialysis catheter placement.  Bonnita Hollow, MD 02/21/2017, 2:17 PM PGY-1, [place] Intern pager: [phone], text pages welcome

## 2017-02-21 NOTE — Progress Notes (Signed)
[facility] Progress Note   Dialysis Orders: [place] MWF 4h 56.5kg 2/2.25 Hep 4000 R IJ TDC (new one placed 10/29) -hect 4 ug tiw -BMM: Fosrenol 1000mg  2 tabs tid  Assessment/Plan: 1. Cardiac stress + for ischemia/ hx remote CABG- Coronary angiogram on11/2 showedpatent bypass grafts x 3 . No interventionrequired at this point and will continuemedicalmanagement per cardiology recommendations. 2. Bladder mass withvoiding difficulty/ chronic indwelling foley- Plans noted from note by Dr.Wrenn yesterday for cystoscopy on 11/20.No pain reported today. 3. AMS- resolved, suspected to be iatrogenicdue to pain meds 4. ESRD - MWF HD. Catheter-dependent, notacandidate for perm accesst due to severe PAD.NEED to make arrangement for transferring to a different HD unit - either [facility] (if [facility] will transport) or needs to be accepted by a HD facility in [facility] - have left message for SW 5. HTN/ volume- Blood pressures well controlled,continueamlodipine, goal 2 L today - lowering EDW for d/c 6. Anemia of CKD- Hb 9.1.Aranesp 60 dosed 11/7 ,Tsat 61%. Follow HGB. 7. MBD of CKD- continue VDRA/Phos 2.3 (hospital diet) decrease fosrenol to 1 gram BID with meals. Ca 10.0 C Ca 10.7 decrease hectoral 3 mcg TIW 8. DMper primary  9. SuicidalIdeation-seen by psychiatry, no active SI , nomeds needed 10. Possible UTI: urine cx no growth. Per primary. Still has strong urine smell 11.  Disp - need to arrange outpt HD before d/c to [place], [place] [phone] 02/21/2017,8:44 AM  LOS: 14 days   Pt seen, examined and agree w A/P as above.  [name] MD [facility] pager [phone]   02/21/2017, 9:48 AM    Subjective:   No c/o - denies pain Objective Vitals:   02/20/17 0845 02/20/17 1744 02/20/17 2125 02/21/17 0457  BP: (!) 178/70 138/79 111/60 (!) 110/59   Pulse: 69 65 (!) 58 64  Resp: 18 18 18 16   Temp: 98.4 F (36.9 C) 98.2 F (36.8 C) 97.8 F (36.6 C) 98.2 F (36.8 C)  TempSrc: Oral Oral Oral Oral  SpO2: 98% 96% 95% 98%  Weight:      Height:       Physical Exam General:  NAD on HD Heart: RRR 3/6 murmur Lungs: no rales Abdomen: soft NT Extremities: right AKA and left transmet LE no edema Dialysis Access:  TDC at 400   Additional Objective Labs: Basic Metabolic Panel: Recent Labs  Lab 02/16/17 0900 02/18/17 0844  NA 132* 133*  K 4.7 4.9  CL 97* 99*  CO2 27 26  GLUCOSE 124* 158*  BUN 47* 43*  CREATININE 6.24* 5.64*  CALCIUM 9.5 10.0  PHOS 2.7 2.3*   Liver Function Tests: Recent Labs  Lab 02/16/17 0900 02/18/17 0844  ALBUMIN 2.6* 2.8*   CBC: Recent Labs  Lab 02/16/17 0900 02/18/17 0843  WBC 5.6 5.7  HGB 9.2* 9.1*  HCT 29.4* 28.8*  MCV 95.1 95.7  PLT 116* 113*   Blood Culture    Component Value Date/Time   SDES URINE, CATHETERIZED 02/18/2017 1448   SPECREQUEST NONE 02/18/2017 1448   CULT NO GROWTH 02/18/2017 1448   REPTSTATUS 02/20/2017 FINAL 02/18/2017 1448    CCBG: Recent Labs  Lab 02/20/17 0804 02/20/17 1136 02/20/17 1744 02/20/17 2123 02/21/17 0800  GLUCAP 156* 195* 119* 248* 154*    Lab Results  Component Value Date   INR 1.03 02/11/2017   INR 1.12 02/07/2017   INR 1.29 07/19/2014   Medications:  . amLODipine  5 mg Oral Daily  . aspirin EC  81 mg Oral BID  . atorvastatin  40 mg Oral QPM  . chlorhexidine  15 mL Mouth Rinse BID  . darbepoetin (ARANESP) injection - DIALYSIS  60 mcg Intravenous Q Wed-HD  . docusate sodium  100 mg Oral Daily  . doxercalciferol  3 mcg Intravenous Q M,W,F-HD  . folic acid  1 mg Oral Daily  . gabapentin  100 mg Oral BID  . heparin  5,000 Units Subcutaneous Q8H  . insulin aspart  0-9 Units Subcutaneous TID AC & HS  . lanthanum  1,000 mg Oral BID WC  . levothyroxine  25 mcg Oral QAC breakfast  . mouth rinse  15 mL Mouth Rinse q12n4p  .  metoprolol tartrate  25 mg Oral BID  . multivitamin with minerals  1 tablet Oral Daily  . sodium chloride flush  3 mL Intravenous Q12H  . thiamine  100 mg Oral Daily

## 2017-02-22 LAB — GLUCOSE, CAPILLARY
Glucose-Capillary: 147 mg/dL — ABNORMAL HIGH (ref 65–99)
Glucose-Capillary: 127 mg/dL — ABNORMAL HIGH (ref 65–99)
Glucose-Capillary: 136 mg/dL — ABNORMAL HIGH (ref 65–99)
Glucose-Capillary: 206 mg/dL — ABNORMAL HIGH (ref 65–99)

## 2017-02-22 NOTE — Clinical Social Work Note (Signed)
CSW informed by Freda Munro in dialysis that [facility] does not have any dialysis chair availability at this time. Contact made with patient's daughter [name] [phone]), and was informed that she will be coming to see her mother this week and wants to talk to [place], MD and nursing regarding her mother. 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 CSW will continue to work on placement for patient.  Shaena Parkerson Givens, MSW, LCSW Licensed Clinical Social Worker [place] [phone]

## 2017-02-22 NOTE — Progress Notes (Signed)
Family Medicine Teaching Service Daily Progress Note Intern Pager: [phone]  Patient name: Brandi Arellano Medical record number: [id number] Date of birth: [birth year] Age: 61 y.o. Gender: female  Primary Care Provider: Kerin Perna, NP Consultants: nephrology, vascular surgery, urology, psychiatry, cardiology Code Status: partial code (DNI, thinking about DNR)  [name]:  10/28 admitted to fpts  10/29 seen by vvs, permcath replaced, dialysis received, seen by urology 10/30 evaluated by psych 10/31 dialysis 11/01 myoview with ischemic findings 11/02 HD, cardiac catheterization 11/05 HD 11/07 HD 11/09 HD  11/12 HD   Assessment and Plan: Turbid Urine Pt complained of urine odor and change in color. Concern for UTI given indwelling catheter. UA had several squamous cells. Urine culture NGTD.   Cardiac ischemia Underwent cardiac cath, no PCI targets. Medical management.  - cardiology recommendations appreciated - vital signs per floor routine - cardiac monitoring  Altered Mental Status Has resolved and patient is now aox4.   vital signs per floor routine - cardiac monitoring - tylenol for pain, fever  ESRD -Pt had severed catheter tip s/p replacement on 10/29. Had HD on 11/2. See social below. Appreciate nephrology recs, creatinine 9.5 10/30. k 4.7.  - dialysis per nephrology  Abdominal pain, Resolved.  Most likely due to neuropathic pain in legs after improvement on gabapentin. - zofran for nausea - gabapentin for neuropathic pain  Nausea and vomiting Resolved. Tolerating po. -zofran and reglan as above  Bladder Mass Patient with a bladder mass in the left posterior base. Potentially the nidus for chronic uti. Large amount of hgb on last few UAs. Urology saw on 10/29.  Receiving myoview due to mulitple cardiac risk factors, pending normal, Will perform cystoscopy as outpt at [facility] in 1-2 weeks.  Potential suicide  attempt Patient's family concerned that his may have been a suicide attempt. Patient denies any thoughts or plans of self-harm or harming others. Evaluated by psych who felt that patient was not at risk for suicide. Will follow up outpatient with psych. 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 Has had recurrent UTI. S/p treatment with vantin. Patient with foul smelling urine. No growth on UA, so cefepime stopped. Continue foley until outpatient follow for procedure with urology.  H/O UTI As above  History of CVA   Patient with right thalamic infarct appreciated on head ct. Has no residual deficits. Already on high intensity statin and aspirin as outpt. - continue atorvastatin, asa 81  Type II diabetes CBGs: 104-224 in past 24 hrs. on sSSI while inpatinet. A1c 6.8 on 10/29. - sSSI - q 4 hour checks  Hyperlipidemia Patient currently on atorvastatin. No lipid panel in system. Continue atorvastatin. Will need lipid panel drawn as outpatient by pcp. - continue atorvastatin - lipid panel at pcp follow up  Hypertension Patient with norvasc 5mg  daily at home.  - continue home norvasc - prn labetolol or hydralazine for systolic>170  Hypothyroidism Continue home synthroid 11mcg daily  Right Above Knee Amputation Patient with no skin breakdown at stump site. Has a nervous tic where she shakes this stump apparently due to phantom limb pain. Will monitor for any s/s developing ulcer.   Left transmetatarsal amputation Stump site without any abnormalities. C/D/i with no skin breakdown.  Diabetic Gastroparesis Will likely need upper GI study as outpatient. Could potentially benefit from vagal nerve stimulator. If abdominal pain does not resolve can consider upper gi while here.  Left arm discomfort: no associated chest pain, intermittent throbbing/muscular in nature -kpad  FEN/GI: renal/carb modified,  miralax PPx: heparin   Dispo: Medically stable for discharge, awaiting ALF placement  Subjective:  Feels well today. No complaints.   Objective: Temp:  [98.1 F (36.7 C)-98.4 F (36.9 C)] 98.1 F (36.7 C) (11/13 0944) Pulse Rate:  [54-62] 62 (11/13 1101) Resp:  [12-16] 14 (11/13 0944) BP: (100-168)/(48-83) 140/54 (11/13 1101) SpO2:  [94 %-97 %] 95 % (11/13 0944) Weight:  [118 lb 9.7 oz (53.8 kg)] 118 lb 9.7 oz (53.8 kg) (11/12 2003) Physical Exam: General: NAD, well appearing CVS: RRR,  murmur Lungs: CTAB, no increased work of breathing Abdomen: Soft, nontender, nondistended   Laboratory: Recent Labs  Lab 02/16/17 0900 02/18/17 0843 02/21/17 0900  WBC 5.6 5.7 6.6  HGB 9.2* 9.1* 9.3*  HCT 29.4* 28.8* 29.8*  PLT 116* 113* 148*   Recent Labs  Lab 02/16/17 0900 02/18/17 0844 02/21/17 0900  NA 132* 133* 132*  K 4.7 4.9 5.1  CL 97* 99* 97*  CO2 27 26 25   BUN 47* 43* 67*  CREATININE 6.24* 5.64* 7.42*  CALCIUM 9.5 10.0 9.9  GLUCOSE 124* 158* 155*   Imaging/Diagnostic Tests: CLINICAL DATA:  Status post dialysis catheter placement  EXAM: PORTABLE CHEST 1 VIEW  COMPARISON:  02/06/2017  FINDINGS: Right-sided dialysis catheter is in place with tips in the right atrium. No pneumothorax identified. Mild cardiac enlargement. Bilateral pleural effusions and pulmonary edema are similar to previous exam  IMPRESSION: 1. No change in pulmonary edema and pleural effusions 2. No complications after right-sided dialysis catheter placement.  Bonnita Hollow, MD 02/22/2017, 1:46 PM PGY-1, [place] Intern pager: [phone], text pages welcome

## 2017-02-22 NOTE — Clinical Social Work Note (Signed)
CSW continuing to work on discharge disposition for patient. Plan was for patient to discharge to [facility] once dialysis set-up at [facility]. Attempted to find placement for patient in [place], but was unsuccessful. CSW advised by HD staff person Freda Munro that the [facility] dialysis center does not have any available chairs. Call made to clinical social work Market researcher, Dr. Reynaldo Minium and message left regarding placement for patient in [place]. CSW will continue to work on SNF placement for patient.

## 2017-02-22 NOTE — Progress Notes (Signed)
Foley catheter was leaking.Uninflated balloon and positioned foley. Notified MD [name]. Pt denies any pain or discomfort at the moment.  Will continue to monitor.

## 2017-02-22 NOTE — Progress Notes (Signed)
Patients foley is leaking. MD notified. MD stated to bladder scan patient. Bladder scan showed 15 cc of urine in the bladder. MD notified. MD stated that he would call back and update with any information. Will continue to monitor.

## 2017-02-22 NOTE — Progress Notes (Signed)
[facility] Progress Note   Dialysis Orders: [place] MWF 4h 56.5kg 2/2.25 Hep 4000 R IJ TDC (new one placed 10/29) -hect 4 ug tiw -BMM: Fosrenol 1000mg  2 tabs tid  Assessment/Plan: 1. AMS- resolved, suspected to be iatrogenicdue to pain meds 2. SuicidalIdeation-seen by psychiatry on 10/30, no active SI , nomeds needed 3. Cracked HD cath - removed and replaced on day of admission 10/29 by VVS 4. Cardiac stress + for ischemia/ hx remote CABG- Coronary angiogram on11/2 showedpatent bypass grafts x 3  5. 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  6. ESRD - MWF HD. Catheter-dependent, notacandidate for perm access due to severe PAD.  Was turned down, per CM, by local SNF"s apparently due to concerns about suicidal issues around time of admission. One SNF in [place] had agreed to take her but the local HD unit there has no open spots. So disposition is still in limbo.  7. HTN/ volume- Blood pressures good, cont norvasc, lowering dry wt.   8. Anemia of CKD- Hb 9.1.Aranesp 60 dosed 11/7 ,Tsat 61%. Follow HGB. 9. MBD of CKD- continue VDRA/Phos 2.3 (hospital diet) decrease fosrenol to 1 gram BID with meals. Ca 10.0 C Ca 10.7 decrease hectoral 3 mcg TIW 10. DMper primary 10. Possible UTI: urine cx no growth. Per primary. Still has strong urine smell 11.  Disp - see above   [name] MD [facility] pager [phone]   02/22/2017, 1:23 PM    Subjective:   No c/o - denies pain Objective Vitals:   02/21/17 2003 02/22/17 0505 02/22/17 0944 02/22/17 1101  BP: 100/83 (!) 105/48 (!) 108/59 (!) 140/54  Pulse: (!) 54 (!) 54 (!) 56 62  Resp: 16 14 14    Temp: 98.4 F (36.9 C) 98.2 F (36.8 C) 98.1 F (36.7 C)   TempSrc:   Oral   SpO2: 95% 94% 95%   Weight: 53.8 kg (118 lb 9.7 oz)     Height:       Physical Exam General:  NAD on HD Heart:  RRR 3/6 murmur Lungs: no rales Abdomen: soft NT Extremities: right AKA and left transmet LE no edema Dialysis Access:  TDC at 400   Additional Objective Labs: Basic Metabolic Panel: Recent Labs  Lab 02/16/17 0900 02/18/17 0844 02/21/17 0900  NA 132* 133* 132*  K 4.7 4.9 5.1  CL 97* 99* 97*  CO2 27 26 25   GLUCOSE 124* 158* 155*  BUN 47* 43* 67*  CREATININE 6.24* 5.64* 7.42*  CALCIUM 9.5 10.0 9.9  PHOS 2.7 2.3* 2.1*   Liver Function Tests: Recent Labs  Lab 02/16/17 0900 02/18/17 0844 02/21/17 0900  ALBUMIN 2.6* 2.8* 2.9*   CBC: Recent Labs  Lab 02/16/17 0900 02/18/17 0843 02/21/17 0900  WBC 5.6 5.7 6.6  HGB 9.2* 9.1* 9.3*  HCT 29.4* 28.8* 29.8*  MCV 95.1 95.7 95.8  PLT 116* 113* 148*   Blood Culture    Component Value Date/Time   SDES URINE, CATHETERIZED 02/18/2017 1448   SPECREQUEST NONE 02/18/2017 1448   CULT NO GROWTH 02/18/2017 1448   REPTSTATUS 02/20/2017 FINAL 02/18/2017 1448    CCBG: Recent Labs  Lab 02/21/17 1329 02/21/17 1701 02/21/17 2029 02/22/17 0742 02/22/17 1222  GLUCAP 177* 203* 113* 147* 127*    Lab Results  Component Value Date   INR 1.03 02/11/2017   INR 1.12 02/07/2017   INR 1.29 07/19/2014   Medications:  . amLODipine  5 mg Oral Daily  . aspirin EC  81 mg Oral BID  . atorvastatin  40 mg Oral QPM  . chlorhexidine  15 mL Mouth Rinse BID  . darbepoetin (ARANESP) injection - DIALYSIS  60 mcg Intravenous Q Wed-HD  . docusate sodium  100 mg Oral Daily  . doxercalciferol  3 mcg Intravenous Q M,W,F-HD  . folic acid  1 mg Oral Daily  . gabapentin  100 mg Oral BID  . heparin  5,000 Units Subcutaneous Q8H  . insulin aspart  0-9 Units Subcutaneous TID AC & HS  . lanthanum  1,000 mg Oral BID WC  . levothyroxine  25 mcg Oral QAC breakfast  . mouth rinse  15 mL Mouth Rinse q12n4p  . metoprolol tartrate  25 mg Oral BID  . multivitamin with minerals  1 tablet Oral Daily  . sodium chloride flush  3 mL Intravenous Q12H  .  thiamine  100 mg Oral Daily

## 2017-02-23 LAB — RENAL FUNCTION PANEL
Albumin: 2.9 g/dL — ABNORMAL LOW (ref 3.5–5.0)
Anion gap: 10 (ref 5–15)
BUN: 64 mg/dL — ABNORMAL HIGH (ref 6–20)
CO2: 25 mmol/L (ref 22–32)
Calcium: 9.2 mg/dL (ref 8.9–10.3)
Chloride: 98 mmol/L — ABNORMAL LOW (ref 101–111)
Creatinine, Ser: 7.24 mg/dL — ABNORMAL HIGH (ref 0.44–1.00)
GFR calc Af Amer: 6 mL/min — ABNORMAL LOW (ref >60)
GFR calc non Af Amer: 6 mL/min — ABNORMAL LOW (ref >60)
Glucose, Bld: 250 mg/dL — ABNORMAL HIGH (ref 65–99)
Phosphorus: 4.7 mg/dL — ABNORMAL HIGH (ref 2.5–4.6)
Potassium: 4.7 mmol/L (ref 3.5–5.1)
Sodium: 133 mmol/L — ABNORMAL LOW (ref 135–145)

## 2017-02-23 LAB — CBC
HCT: 27.4 % — ABNORMAL LOW (ref 36.0–46.0)
HCT: 28.8 % — ABNORMAL LOW (ref 36.0–46.0)
HEMOGLOBIN: 8.7 g/dL — AB (ref 12.0–15.0)
Hemoglobin: 8.8 g/dL — ABNORMAL LOW (ref 12.0–15.0)
MCH: 30.3 pg (ref 26.0–34.0)
MCH: 29.2 pg (ref 26.0–34.0)
MCHC: 31.8 g/dL (ref 30.0–36.0)
MCHC: 30.6 g/dL (ref 30.0–36.0)
MCV: 95.5 fL (ref 78.0–100.0)
MCV: 95.7 fL (ref 78.0–100.0)
Platelets: 154 10*3/uL (ref 150–400)
Platelets: 148 K/uL — ABNORMAL LOW (ref 150–400)
RBC: 2.87 MIL/uL — AB (ref 3.87–5.11)
RBC: 3.01 MIL/uL — ABNORMAL LOW (ref 3.87–5.11)
RDW: 18.7 % — ABNORMAL HIGH (ref 11.5–15.5)
RDW: 18.4 % — ABNORMAL HIGH (ref 11.5–15.5)
WBC: 5.9 10*3/uL (ref 4.0–10.5)
WBC: 6.3 K/uL (ref 4.0–10.5)

## 2017-02-23 LAB — GLUCOSE, CAPILLARY
GLUCOSE-CAPILLARY: 127 mg/dL — AB (ref 65–99)
Glucose-Capillary: 196 mg/dL — ABNORMAL HIGH (ref 65–99)
Glucose-Capillary: 177 mg/dL — ABNORMAL HIGH (ref 65–99)

## 2017-02-23 MED ORDER — DOXERCALCIFEROL 4 MCG/2ML IV SOLN
INTRAVENOUS | Status: AC
  Administered 2017-02-23: 3 ug via INTRAVENOUS
  Filled 2017-02-23: qty 2

## 2017-02-23 MED ORDER — LIDOCAINE-PRILOCAINE 2.5-2.5 % EX CREA: 1.0000 "application " | TOPICAL_CREAM | CUTANEOUS | Status: DC | PRN

## 2017-02-23 MED ORDER — ALTEPLASE 2 MG IJ SOLR: 2.0000 mg | Freq: Once | INTRAMUSCULAR | Status: DC | PRN

## 2017-02-23 MED ORDER — HEPARIN SODIUM (PORCINE) 1000 UNIT/ML DIALYSIS: 1000.0000 [IU] | INTRAMUSCULAR | Status: DC | PRN

## 2017-02-23 MED ORDER — HEPARIN SODIUM (PORCINE) 1000 UNIT/ML DIALYSIS
4000.0000 [IU] | Freq: Once | INTRAMUSCULAR | Status: AC
  Administered 2017-02-23: 4000 [IU] via INTRAVENOUS_CENTRAL

## 2017-02-23 MED ORDER — SODIUM CHLORIDE 0.9 % IV SOLN: 100.0000 mL | INTRAVENOUS | Status: DC | PRN

## 2017-02-23 MED ORDER — LIDOCAINE HCL (PF) 1 % IJ SOLN: 5.0000 mL | INTRAMUSCULAR | Status: DC | PRN

## 2017-02-23 MED ORDER — DARBEPOETIN ALFA 100 MCG/0.5ML IJ SOSY
100.0000 ug | PREFILLED_SYRINGE | INTRAMUSCULAR | Status: DC
  Administered 2017-02-23: 100 ug via INTRAVENOUS
  Filled 2017-02-23: qty 0.5

## 2017-02-23 MED ORDER — PENTAFLUOROPROP-TETRAFLUOROETH EX AERO: 1.0000 "application " | INHALATION_SPRAY | CUTANEOUS | Status: DC | PRN

## 2017-02-23 MED ORDER — DARBEPOETIN ALFA 100 MCG/0.5ML IJ SOSY
PREFILLED_SYRINGE | INTRAMUSCULAR | Status: AC
  Administered 2017-02-23: 100 ug via INTRAVENOUS
  Filled 2017-02-23: qty 0.5

## 2017-02-23 NOTE — Progress Notes (Signed)
[facility] Progress Note   Subjective: No C/Os. Ordering breakfast. Wondering when she will leave hospital   Objective Vitals:   02/22/17 1101 02/22/17 1716 02/22/17 2249 02/23/17 0437  BP: (!) 140/54 (!) 153/58 (!) 116/52 (!) 135/56  Pulse: 62 (!) 55 64 (!) 56  Resp:   16 17  Temp:  98.5 F (36.9 C) 98.5 F (36.9 C) 98.1 F (36.7 C)  TempSrc:  Oral    SpO2:  93% 92% 96%  Weight:   53.1 kg (117 lb 1 oz)   Height:       Physical Exam General: Awake, Alert,NAD Heart: S1, S2, RRR 3/6 systolic M.  Lungs: CTAB A/P Abdomen: SNT. Foley with greenish brown urine.  Extremities: R aka L transmet, No LE edema.  Dialysis Access: RIJ [facility]  Additional Objective Labs: Basic Metabolic Panel: Recent Labs  Lab 02/18/17 0844 02/21/17 0900  NA 133* 132*  K 4.9 5.1  CL 99* 97*  CO2 26 25  GLUCOSE 158* 155*  BUN 43* 67*  CREATININE 5.64* 7.42*  CALCIUM 10.0 9.9  PHOS 2.3* 2.1*   Liver Function Tests: Recent Labs  Lab 02/18/17 0844 02/21/17 0900  ALBUMIN 2.8* 2.9*   No results for input(s): LIPASE, AMYLASE in the last 168 hours. CBC: Recent Labs  Lab 02/18/17 0843 02/21/17 0900 02/23/17 0438  WBC 5.7 6.6 5.9  HGB 9.1* 9.3* 8.7*  HCT 28.8* 29.8* 27.4*  MCV 95.7 95.8 95.5  PLT 113* 148* 154   Blood Culture    Component Value Date/Time   SDES URINE, CATHETERIZED 02/18/2017 1448   SPECREQUEST NONE 02/18/2017 1448   CULT NO GROWTH 02/18/2017 1448   REPTSTATUS 02/20/2017 FINAL 02/18/2017 1448    Cardiac Enzymes: No results for input(s): CKTOTAL, CKMB, CKMBINDEX, TROPONINI in the last 168 hours. CBG: Recent Labs  Lab 02/22/17 0742 02/22/17 1222 02/22/17 1714 02/22/17 2243 02/23/17 0744  GLUCAP 147* 127* 136* 206* 127*   Iron Studies: No results for input(s): IRON, TIBC, TRANSFERRIN, FERRITIN in the last 72 hours. @lablastinr3 @ Studies/Results: No results found. Medications:  . amLODipine  5 mg Oral Daily  . aspirin EC  81 mg  Oral BID  . atorvastatin  40 mg Oral QPM  . chlorhexidine  15 mL Mouth Rinse BID  . darbepoetin (ARANESP) injection - DIALYSIS  60 mcg Intravenous Q Wed-HD  . docusate sodium  100 mg Oral Daily  . doxercalciferol  3 mcg Intravenous Q M,W,F-HD  . folic acid  1 mg Oral Daily  . gabapentin  100 mg Oral BID  . heparin  5,000 Units Subcutaneous Q8H  . insulin aspart  0-9 Units Subcutaneous TID AC & HS  . lanthanum  1,000 mg Oral BID WC  . levothyroxine  25 mcg Oral QAC breakfast  . mouth rinse  15 mL Mouth Rinse q12n4p  . metoprolol tartrate  25 mg Oral BID  . multivitamin with minerals  1 tablet Oral Daily  . sodium chloride flush  3 mL Intravenous Q12H  . thiamine  100 mg Oral Daily     Dialysis Orders: [place] MWF 4h 56.5kg 2/2.25 Hep 4000 R IJ TDC (new one placed 10/29) -hect 4 ug tiw -BMM: Fosrenol 1000mg  2 tabs tid  Assessment/Plan: 1. AMS- resolved, suspected to be iatrogenicdue to pain meds 2. SuicidalIdeation-seen by psychiatry on 10/30, no active SI , nomeds needed 3. Cracked HD cath - removed and replaced on day of admission 10/29 by VVS 4. Cardiac stress + for  ischemia/ hx remote CABG- Coronary angiogram on11/2 showedpatent bypass grafts x 3  5. 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  6. ESRD - MWF HD. Catheter-dependent, notacandidate for perm access due to severe PAD.  Was turned down, per CM, by local SNF"s apparently due to concerns about suicidal issues around time of admission. One SNF in [place] had agreed to take her but the local HD unit there has no open spots. So disposition is still in limbo. HD today on schedule.  7. HTN/ volume- Blood pressures good, cont norvasc, lowering dry wt. Attempt UFG 1-1.5 liters today.  8. Anemia of CKD- Hb 8.7.Aranesp 60 dosed 11/7 , Increase dose and give on schedule today. Tsat 61%. Follow HGB. 9. MBD of CKD-  continue VDRA/Phos 2.1 (hospital diet) Hold binders. Ca 9.9 C Ca 10.7 decrease hectoral 3 mcg TIW Change to 2.0 Ca bath.  10. DMper primary 10. Possible UTI: urine cx no growth. Per primary.Still has strong urine smell 11.  Disp - see above   Jimmye Norman. Brown NP-C 02/23/2017, 9:40 AM  [facility] [phone]  Pt seen, examined and agree w A/P as above.  [name] MD [employer] pager [phone]   02/23/2017, 2:27 PM

## 2017-02-23 NOTE — Progress Notes (Signed)
Family Medicine Teaching Service Daily Progress Note Intern Pager: [phone]  Patient name: Brandi Arellano Medical record number: [id number] Date of birth: 1955-12-28 Age: 61 y.o. Gender: female  Primary Care Provider: Kerin Perna, NP Consultants: nephrology, vascular surgery, urology, psychiatry, cardiology Code Status: partial code (DNI, thinking about DNR)  [name]:  10/28 admitted to fpts  10/29 seen by vvs, permcath replaced, dialysis received, seen by urology 10/30 evaluated by psych 10/31 dialysis 11/01 myoview with ischemic findings 11/02 HD, cardiac catheterization 11/05 HD 11/07 HD 11/09 HD  11/12 HD   Assessment and Plan: Turbid Urine Pt complained of urine odor and change in color. Concern for UTI given indwelling catheter. UA had several squamous cells. Urine culture NGTD.   Cardiac ischemia Underwent cardiac cath, no PCI targets. Medical management.  - cardiology recommendations appreciated - vital signs per floor routine - cardiac monitoring  Altered Mental Status Has resolved and patient is now aox4.   vital signs per floor routine - cardiac monitoring - tylenol for pain, fever  ESRD -Pt had severed catheter tip s/p replacement on 10/29. Had HD on 11/2. See social below. Appreciate nephrology recs, creatinine 9.5 10/30. k 4.7.  - dialysis per nephrology  Abdominal pain, Resolved.  Most likely due to neuropathic pain in legs after improvement on gabapentin. - zofran for nausea - gabapentin for neuropathic pain  Nausea and vomiting Resolved. Tolerating po. -zofran and reglan as above  Bladder Mass Patient with a bladder mass in the left posterior base. Potentially the nidus for chronic uti. Large amount of hgb on last few UAs. Urology saw on 10/29.  Receiving myoview due to mulitple cardiac risk factors, pending normal, Will perform cystoscopy as outpt at [facility] in 1-2 weeks.  Potential suicide  attempt Patient's family concerned that his may have been a suicide attempt. Patient denies any thoughts or plans of self-harm or harming others. Evaluated by psych who felt that patient was not at risk for suicide. Will follow up outpatient with psych. 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 Has had recurrent UTI. S/p treatment with vantin. Patient with foul smelling urine. No growth on UA, so cefepime stopped. Continue foley until outpatient follow for procedure with urology.  H/O UTI As above  History of CVA   Patient with right thalamic infarct appreciated on head ct. Has no residual deficits. Already on high intensity statin and aspirin as outpt. - continue atorvastatin, asa 81  Type II diabetes CBGs: 104-224 in past 24 hrs. on sSSI while inpatinet. A1c 6.8 on 10/29. - sSSI - q 4 hour checks  Hyperlipidemia Patient currently on atorvastatin. No lipid panel in system. Continue atorvastatin. Will need lipid panel drawn as outpatient by pcp. - continue atorvastatin - lipid panel at pcp follow up  Hypertension Patient with norvasc 5mg  daily at home.  - continue home norvasc - prn labetolol or hydralazine for systolic>170  Hypothyroidism Continue home synthroid 72mcg daily  Right Above Knee Amputation Patient with no skin breakdown at stump site. Has a nervous tic where she shakes this stump apparently due to phantom limb pain. Will monitor for any s/s developing ulcer.   Left transmetatarsal amputation Stump site without any abnormalities. C/D/i with no skin breakdown.  Diabetic Gastroparesis Will likely need upper GI study as outpatient. Could potentially benefit from vagal nerve stimulator. If abdominal pain does not resolve can consider upper gi while here.  Left arm discomfort: no associated chest pain, intermittent throbbing/muscular in nature -kpad  FEN/GI: renal/carb modified,  miralax PPx: heparin   Dispo: Medically stable for discharge, awaiting ALF placement  Subjective:  Sleeping in bed. No complaints  Objective: Temp:  [98.1 F (36.7 C)-98.5 F (36.9 C)] 98.1 F (36.7 C) (11/14 0437) Pulse Rate:  [55-64] 56 (11/14 0437) Resp:  [14-17] 17 (11/14 0437) BP: (108-153)/(52-59) 135/56 (11/14 0437) SpO2:  [92 %-96 %] 96 % (11/14 0437) Weight:  [117 lb 1 oz (53.1 kg)] 117 lb 1 oz (53.1 kg) (11/13 2249) Physical Exam: General: NAD, resting in bed CVS: RRR,  murmur Lungs: CTAB, no increased work of breathing Abdomen: Soft, nontender, nondistended    Laboratory: Recent Labs  Lab 02/18/17 0843 02/21/17 0900 02/23/17 0438  WBC 5.7 6.6 5.9  HGB 9.1* 9.3* 8.7*  HCT 28.8* 29.8* 27.4*  PLT 113* 148* 154   Recent Labs  Lab 02/18/17 0844 02/21/17 0900  NA 133* 132*  K 4.9 5.1  CL 99* 97*  CO2 26 25  BUN 43* 67*  CREATININE 5.64* 7.42*  CALCIUM 10.0 9.9  GLUCOSE 158* 155*   Imaging/Diagnostic Tests: CLINICAL DATA:  Status post dialysis catheter placement  EXAM: PORTABLE CHEST 1 VIEW  COMPARISON:  02/06/2017  FINDINGS: Right-sided dialysis catheter is in place with tips in the right atrium. No pneumothorax identified. Mild cardiac enlargement. Bilateral pleural effusions and pulmonary edema are similar to previous exam  IMPRESSION: 1. No change in pulmonary edema and pleural effusions 2. No complications after right-sided dialysis catheter placement.  Bonnita Hollow, MD 02/23/2017, 9:10 AM PGY-1, [place] Intern pager: [phone], text pages welcome

## 2017-02-24 LAB — GLUCOSE, CAPILLARY
GLUCOSE-CAPILLARY: 205 mg/dL — AB (ref 65–99)
Glucose-Capillary: 169 mg/dL — ABNORMAL HIGH (ref 65–99)
Glucose-Capillary: 105 mg/dL — ABNORMAL HIGH (ref 65–99)
Glucose-Capillary: 150 mg/dL — ABNORMAL HIGH (ref 65–99)

## 2017-02-24 NOTE — Clinical Social Work Note (Signed)
On 02/23/17 CSW consulted with clinical social work Market researcher Dr. Reynaldo Minium regarding patient and difficulty in placement. CSW advised to contact [employer] regarding placement, and dialysis transportation. Per Dr. Reynaldo Minium, if accepted, patient will be an LOG and her dialysis transportation (private company,  not facility transport) will be billed to clinical social work. Call made to facility and message left for admissions director Rhonda.   Talked with Suanne Marker on 11/15 about patient and clinicals transmitted.  CSW will follow-up with Suanne Marker on 11/16 to determine if they can accept patient.  Antwon Rochin Givens, MSW, LCSW Licensed Clinical Social Worker [place] [phone]

## 2017-02-24 NOTE — Progress Notes (Signed)
Family Medicine Teaching Service Daily Progress Note Intern Pager: [phone]  Patient name: Brandi Arellano Medical record number: [id number] Date of birth: 09/02/1955 Age: 60 y.o. Gender: female  Primary Care Provider: Kerin Perna, NP Consultants: nephrology, vascular surgery, urology, psychiatry, cardiology Code Status: partial code (DNI, thinking about DNR)  [name]:  10/28 admitted to fpts  10/29 seen by vvs, permcath replaced, dialysis received, seen by urology 10/30 evaluated by psych 10/31 dialysis 11/01 myoview with ischemic findings 11/02 HD, cardiac catheterization 11/05 HD 11/07 HD 11/09 HD  11/12 HD  11/14 HD  Assessment and Plan:  ESRD - dialysis per nephrology  Bladder Mass Planned cystoscopy at  [facility] on 11/20. Concern that placement outside [facility] will limit patients ability to get to procedure. If not placed in ALF before 11/20, patient will need transport to procedure from [facility] to [place].  Patient has chronic indwelling foley 2/2 urinary retention. Patient has had intermittant urine leak, but no complaint today.  - Continue foley until outpatient follow for procedure with urology.  Type II diabetes CBGs stable. - sSSI - q 4 hour checks  Hypertension Hemodynamically stable.   FEN/GI: renal/carb modified, miralax PPx: heparin   Dispo: Medically stable for discharge, awaiting ALF placement  Subjective:  Awake in bed. No complaints this AM. Brother dropped off some clothes for her at discharge. Reports that she can no longer go live with her daughter moving from [place] to [employer].  Objective: Temp:  [98.3 F (36.8 C)-98.7 F (37.1 C)] 98.5 F (36.9 C) (11/15 0834) Pulse Rate:  [60-71] 64 (11/15 0834) Resp:  [18-20] 18 (11/15 0834) BP: (114-192)/(46-94) 138/58 (11/15 0834) SpO2:  [92 %-100 %] 94 % (11/15 0834) Weight:  [121 lb 11.1 oz (55.2 kg)-125 lb 3.5 oz (56.8 kg)] 121 lb 11.1 oz (55.2 kg)  (11/14 2205) Physical Exam: General: NAD, resting in bed CVS: RRR,  murmur Lungs: CTAB, no increased work of breathing Abdomen: Soft, nontender, nondistended MSK: no lower extremity edema, moves all limbs spontaneously  Bonnita Hollow, MD 02/24/2017, 10:59 AM PGY-1, [place] Intern pager: [phone], text pages welcome

## 2017-02-24 NOTE — Progress Notes (Signed)
[facility] Progress Note   Subjective: Ordering breakfast. No new c/o.   Objective Vitals:   02/23/17 2205 02/24/17 0025 02/24/17 0532 02/24/17 0834  BP: (!) 190/69 (!) 114/46 (!) 127/52 (!) 138/58  Pulse: 71 66 65 64  Resp: 20 18 19 18   Temp: 98.4 F (36.9 C)  98.6 F (37 C) 98.5 F (36.9 C)  TempSrc: Oral  Oral Oral  SpO2: 100% 93% 92% 94%  Weight: 55.2 kg (121 lb 11.1 oz)     Height:       Physical Exam General: Awake, Alert,NAD Heart: S1, S2, RRR 3/6 systolic M.  Lungs: CTAB A/P Abdomen: SNT. Foley with greenish brown urine.  Extremities: R aka L transmet, No LE edema.  Dialysis Access: RIJ [facility] Xxx Drsg CDI  Additional Objective Labs: Basic Metabolic Panel: Recent Labs  Lab 02/18/17 0844 02/21/17 0900 02/23/17 1820  NA 133* 132* 133*  K 4.9 5.1 4.7  CL 99* 97* 98*  CO2 26 25 25   GLUCOSE 158* 155* 250*  BUN 43* 67* 64*  CREATININE 5.64* 7.42* 7.24*  CALCIUM 10.0 9.9 9.2  PHOS 2.3* 2.1* 4.7*   Liver Function Tests: Recent Labs  Lab 02/18/17 0844 02/21/17 0900 02/23/17 1820  ALBUMIN 2.8* 2.9* 2.9*   No results for input(s): LIPASE, AMYLASE in the last 168 hours. CBC: Recent Labs  Lab 02/18/17 0843 02/21/17 0900 02/23/17 0438 02/23/17 1820  WBC 5.7 6.6 5.9 6.3  HGB 9.1* 9.3* 8.7* 8.8*  HCT 28.8* 29.8* 27.4* 28.8*  MCV 95.7 95.8 95.5 95.7  PLT 113* 148* 154 148*   Blood Culture    Component Value Date/Time   SDES URINE, CATHETERIZED 02/18/2017 1448   SPECREQUEST NONE 02/18/2017 1448   CULT NO GROWTH 02/18/2017 1448   REPTSTATUS 02/20/2017 FINAL 02/18/2017 1448    Cardiac Enzymes: No results for input(s): CKTOTAL, CKMB, CKMBINDEX, TROPONINI in the last 168 hours. CBG: Recent Labs  Lab 02/23/17 0744 02/23/17 1212 02/23/17 2103 02/24/17 0829 02/24/17 1216  GLUCAP 127* 196* 177* 169* 105*   Iron Studies: No results for input(s): IRON, TIBC, TRANSFERRIN, FERRITIN in the last 72 hours. @lablastinr3 @ Studies/Results: No  results found. Medications:  . amLODipine  5 mg Oral Daily  . aspirin EC  81 mg Oral BID  . atorvastatin  40 mg Oral QPM  . chlorhexidine  15 mL Mouth Rinse BID  . darbepoetin (ARANESP) injection - DIALYSIS  100 mcg Intravenous Q Wed-HD  . docusate sodium  100 mg Oral Daily  . doxercalciferol  3 mcg Intravenous Q M,W,F-HD  . folic acid  1 mg Oral Daily  . gabapentin  100 mg Oral BID  . heparin  5,000 Units Subcutaneous Q8H  . insulin aspart  0-9 Units Subcutaneous TID AC & HS  . levothyroxine  25 mcg Oral QAC breakfast  . mouth rinse  15 mL Mouth Rinse q12n4p  . metoprolol tartrate  25 mg Oral BID  . multivitamin with minerals  1 tablet Oral Daily  . sodium chloride flush  3 mL Intravenous Q12H  . thiamine  100 mg Oral Daily     Dialysis Orders: [place] MWF 4h 56.5kg 2/2.25 Hep 4000 R IJ TDC (new one placed 10/29) -hect 4 ug tiw -BMM: Fosrenol 1000mg  2 tabs tid  Assessment: 1. AMS- resolved, suspected to be iatrogenicdue to pain meds 2. Possible SuicidalIdeation-seen by psychiatry on 10/30, no active SI , nomeds needed 3. Cracked HD cath - removed and replaced on day of admission 10/29 by  VVS 4. Cardiac stress + for ischemia/ hx remote CABG- Coronary angiogram on11/2 showedpatent bypass grafts x 3  5. 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  6. ESRD - MWF HD. Catheter-dependent, notacandidate for perm access due to severe PAD. 7. Volume- Blood pressures good, cont norvasc, lowering dry wt. 8. Anemia of CKD- Hb 8.7.Aranesp 60 dosed 11/7 , Increase dose and give on schedule today. Tsat 61%. Follow HGB. 9. MBD of CKD- continue VDRA/Phos 2.1 (hospital diet) Hold binders. Ca 9.9 C Ca 10.7 decrease hectoral 3 mcg TIW Change to 2.0 Ca bath.  10. DMper primary 10. Possible UTI: urine cx no growth. Per primary.Still has strong urine smell 11.  Disp - see above  Plan  - awaiting SNF placement  [name] MD [place] pager [phone]   02/24/2017, 12:46 PM

## 2017-02-25 LAB — RENAL FUNCTION PANEL
Albumin: 2.8 g/dL — ABNORMAL LOW (ref 3.5–5.0)
Anion gap: 9 (ref 5–15)
BUN: 67 mg/dL — ABNORMAL HIGH (ref 6–20)
CO2: 25 mmol/L (ref 22–32)
Calcium: 9 mg/dL (ref 8.9–10.3)
Chloride: 100 mmol/L — ABNORMAL LOW (ref 101–111)
Creatinine, Ser: 6.71 mg/dL — ABNORMAL HIGH (ref 0.44–1.00)
GFR calc Af Amer: 7 mL/min — ABNORMAL LOW
GFR calc non Af Amer: 6 mL/min — ABNORMAL LOW
Glucose, Bld: 198 mg/dL — ABNORMAL HIGH (ref 65–99)
Phosphorus: 4.8 mg/dL — ABNORMAL HIGH (ref 2.5–4.6)
Potassium: 5.2 mmol/L — ABNORMAL HIGH (ref 3.5–5.1)
Sodium: 134 mmol/L — ABNORMAL LOW (ref 135–145)

## 2017-02-25 LAB — CBC
HCT: 27.9 % — ABNORMAL LOW (ref 36.0–46.0)
Hemoglobin: 8.7 g/dL — ABNORMAL LOW (ref 12.0–15.0)
MCH: 30 pg (ref 26.0–34.0)
MCHC: 31.2 g/dL (ref 30.0–36.0)
MCV: 96.2 fL (ref 78.0–100.0)
Platelets: 122 K/uL — ABNORMAL LOW (ref 150–400)
RBC: 2.9 MIL/uL — ABNORMAL LOW (ref 3.87–5.11)
RDW: 18.8 % — ABNORMAL HIGH (ref 11.5–15.5)
WBC: 6.4 K/uL (ref 4.0–10.5)

## 2017-02-25 LAB — URINE CULTURE: Culture: NO GROWTH

## 2017-02-25 LAB — GLUCOSE, CAPILLARY
Glucose-Capillary: 158 mg/dL — ABNORMAL HIGH (ref 65–99)
Glucose-Capillary: 198 mg/dL — ABNORMAL HIGH (ref 65–99)
Glucose-Capillary: 121 mg/dL — ABNORMAL HIGH (ref 65–99)

## 2017-02-25 MED ORDER — DOXERCALCIFEROL 4 MCG/2ML IV SOLN
INTRAVENOUS | Status: AC
Start: 2017-02-25 — End: 2017-02-25
  Filled 2017-02-25: qty 2

## 2017-02-25 NOTE — Progress Notes (Signed)
Family Medicine Teaching Service Daily Progress Note Intern Pager: [phone]  Patient name: Brandi Arellano Medical record number: [id number] Date of birth: December 19, 1955 Age: 61 y.o. Gender: female  Primary Care Provider: Kerin Perna, NP Consultants: nephrology, vascular surgery, urology, psychiatry, cardiology Code Status: partial code (DNI, thinking about DNR)  [name]:  10/28 admitted to fpts  10/29 seen by vvs, permcath replaced, dialysis received, seen by urology 10/30 evaluated by psych 10/31 dialysis 11/01 myoview with ischemic findings 11/02 HD, cardiac catheterization 11/05 HD 11/07 HD 11/09 HD  11/12 HD  11/14 HD 11/16 HD  Assessment and Plan:  ESRD - dialysis per nephrology  Bladder Mass Planned cystoscopy at  [facility] on 11/20. Concern that placement outside [facility] will limit patients ability to get to procedure. If not placed in ALF before 11/20, patient will need transport to procedure from [facility] to [place].  Patient has chronic indwelling foley 2/2 urinary retention. Patient has had intermittant urine leak, but no complaint today.  - Continue foley until outpatient follow for procedure with urology.  Type II diabetes CBGs stable. - sSSI - q 4 hour checks  FEN/GI: renal/carb modified, miralax PPx: heparin  Dispo: Medically stable for discharge, awaiting ALF placement  Subjective:  Patient has no complaints.   Objective: Temp:  [98.2 F (36.8 C)-98.7 F (37.1 C)] 98.2 F (36.8 C) (11/16 1124) Pulse Rate:  [60-67] 64 (11/16 1124) Resp:  [18] 18 (11/16 1124) BP: (125-165)/(51-88) 152/58 (11/16 1124) SpO2:  [90 %-97 %] 95 % (11/16 1124) Weight:  [124 lb 12.5 oz (56.6 kg)-129 lb 3 oz (58.6 kg)] 124 lb 12.5 oz (56.6 kg) (11/16 1035) Physical Exam: General: NAD, currently at HD CVS: RRR,  murmur Lungs: CTAB, no increased work of breathing Abdomen: Soft, nontender, nondistended MSK: no lower extremity edema, moves  all limbs spontaneously  Bonnita Hollow, MD 02/25/2017, 1:24 PM PGY-1, [place] Intern pager: [phone], text pages welcome

## 2017-02-25 NOTE — Care Management Note (Signed)
Case Management Note  Patient Details  Name: Shanta Hartner MRN: [id number] Date of Birth: 06/02/1955  Subjective/Objective:         CM following for progression and d/c planning.            Action/Plan: 02/25/2017 CSW still working with this pt for SNF placement. Issues continues to be refusal of facilities to accept this pt due to concerns re her possible mistreatment of her HD catheter. Search is now state wide. Once a facility accepts this pt , a hemodialysis center will need to be obtained near that facility.   Expected Discharge Date:                  Expected Discharge Plan:  Skilled Nursing Facility  In-House Referral:  Clinical Social Work  Discharge planning Services  CM Consult  Post Acute Care Choice:  NA Choice offered to:  NA  DME Arranged:  N/A DME Agency:  NA  HH Arranged:  NA HH Agency:  NA  Status of Service:  Completed, signed off  If discussed at [place] of Stay Meetings, dates discussed:    Additional Comments:  Adron Bene, RN 02/25/2017, 4:32 PM

## 2017-02-25 NOTE — Progress Notes (Signed)
Subjective:  No cos , just back from HD  And tolerated .  Objective Vital signs in last 24 hours: Vitals:   02/25/17 0930 02/25/17 1000 02/25/17 1035 02/25/17 1124  BP: 125/68 128/67 131/67 (!) 152/58  Pulse: 65 63 63 64  Resp: 18 18 18 18   Temp:   98.7 F (37.1 C) 98.2 F (36.8 C)  TempSrc:   Oral Oral  SpO2:   95% 95%  Weight:   56.6 kg (124 lb 12.5 oz)   Height:       Weight change: 1.8 kg (3 lb 15.5 oz)  Physical Exam: General: Awake, Alert,NAD Heart:  RRR with 3/6 systolic M. No rub  Lungs: CTA bilat. Abdomen:  BS pos. S oft ,  ND, NT. Foley with greenish brown urine.  Extremities: R Aka L transmet.healed  No LE edema.  Dialysis Access: RIJ TDC Drsg CDI   Dialysis Orders: [place] MWF 4h 56.5kg 2/2.25 Hep 4000 R IJ TDC (new one placed 10/29) -hect 4 ug tiw -BMM: Fosrenol 1000mg  2 tabs tid  Problem/Plan: 1. AMS- resolved, suspected to be iatrogenicdue to pain meds 2. Possible SuicidalIdeation-seen by psychiatryon 10/30, no active SI , nomeds needed 3. ESRD - MWF HD. Catheter-dependent, notacandidate for perm access due to severe PAD. 4. Cracked HD cath - removed and replaced on day of admission 10/29 by VVS/ Functional with hD  Today  No problems  5. Volume/ HTN - Blood pressuresgood, cont norvasc, lowering dry wt 6. Cardiac stress + for ischemia/ hx remote CABG- Coronary angiogram on11/2 showedpatent bypass grafts x 3. 7. Anemia of CKD- Hb  8.8>8.7 today .Aranesp 60 dosed 11/7 , Increase dose 100mg  on Wed . Tsat 61%. No Fe/ Follow HGB. 8. MBD of CKD- continue VDRA/Phos 2.1 (hospital diet) Hold binders with Phos ok  Corec. Ca 9.9  Phos 4.8  , have decreased hectoral 3 mcg TIW / use 2.0 Ca bath.next hd  9.  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 DMper primary 11. Possible UTI: urine cx no growth. Per primary.no antibiot. 12. Disp - per admit awaiting  ALF placement    Ernest Haber, PA-C [place] [phone] 02/25/2017,12:08 PM  LOS: 18 days   Pt seen, examined and agree w A/P as above.  [name] MD [facility] pager [phone]   02/25/2017, 3:02 PM    Labs: Basic Metabolic Panel: Recent Labs  Lab 02/21/17 0900 02/23/17 1820 02/25/17 0625  NA 132* 133* 134*  K 5.1 4.7 5.2*  CL 97* 98* 100*  CO2 25 25 25   GLUCOSE 155* 250* 198*  BUN 67* 64* 67*  CREATININE 7.42* 7.24* 6.71*  CALCIUM 9.9 9.2 9.0  PHOS 2.1* 4.7* 4.8*   Liver Function Tests: Recent Labs  Lab 02/21/17 0900 02/23/17 1820 02/25/17 0625  ALBUMIN 2.9* 2.9* 2.8*   No results for input(s): LIPASE, AMYLASE in the last 168 hours. No results for input(s): AMMONIA in the last 168 hours. CBC: Recent Labs  Lab 02/21/17 0900 02/23/17 0438 02/23/17 1820 02/25/17 0625  WBC 6.6 5.9 6.3 6.4  HGB 9.3* 8.7* 8.8* 8.7*  HCT 29.8* 27.4* 28.8* 27.9*  MCV 95.8 95.5 95.7 96.2  PLT 148* 154 148* 122*   Cardiac Enzymes: No results for input(s): CKTOTAL, CKMB, CKMBINDEX, TROPONINI in the last 168 hours. CBG: Recent Labs  Lab 02/24/17 0829 02/24/17 1216 02/24/17 1726 02/24/17 2036 02/25/17 1139  GLUCAP 169* 105* 150*  205* 158*    Studies/Results: No results found. Medications:  . amLODipine  5 mg Oral Daily  . aspirin EC  81 mg Oral BID  . atorvastatin  40 mg Oral QPM  . chlorhexidine  15 mL Mouth Rinse BID  . darbepoetin (ARANESP) injection - DIALYSIS  100 mcg Intravenous Q Wed-HD  . docusate sodium  100 mg Oral Daily  . doxercalciferol  3 mcg Intravenous Q M,W,F-HD  . folic acid  1 mg Oral Daily  . gabapentin  100 mg Oral BID  . heparin  5,000 Units Subcutaneous Q8H  . insulin aspart  0-9 Units Subcutaneous TID AC & HS  . levothyroxine  25 mcg Oral QAC breakfast  . mouth rinse  15 mL Mouth Rinse q12n4p  . metoprolol tartrate  25 mg Oral BID  . multivitamin with minerals  1 tablet Oral Daily  .  sodium chloride flush  3 mL Intravenous Q12H  . thiamine  100 mg Oral Daily

## 2017-02-25 NOTE — Clinical Social Work Note (Signed)
Initiated contact with Suanne Marker, admissions Mudlogger at [facility] on 11/15 and clinicals transmitted to facility same date. Talked with Suanne Marker today regarding patient and questions regarding sitter (discontinued) and psychiatry following answered. She expressed concern regarding report of patient's perm cath to R chest (right subclavian) being cut. CSW informed by Suanne Marker that she will need to talk with her boss and will let CSW know if they can accept patient. As of the writing of this note, CSW has not heard back from [facility].   Patient informed on 11/15 regarding continued efforts to locate SNF placement and Ms. Alroy Dust commented pleasantly that she was being patient.  CSW also talked earlier today with Freda Munro, HD staff person regarding patient's possible admission to a facility in [place], [place] and that HD would have to be set-up in [place] if facility accepts patient. CSW will follow-up with admissions director at [facility] and provide intervention services to patient as needed.  Bannon Giammarco Givens, MSW, LCSW Licensed Clinical Social Worker [place] [phone]

## 2017-02-26 LAB — GLUCOSE, CAPILLARY
GLUCOSE-CAPILLARY: 178 mg/dL — AB (ref 65–99)
Glucose-Capillary: 181 mg/dL — ABNORMAL HIGH (ref 65–99)
Glucose-Capillary: 157 mg/dL — ABNORMAL HIGH (ref 65–99)
Glucose-Capillary: 185 mg/dL — ABNORMAL HIGH (ref 65–99)

## 2017-02-26 NOTE — Progress Notes (Signed)
Family Medicine Teaching Service Daily Progress Note Intern Pager: [phone]  Patient name: Brandi Arellano Medical record number: [id number] Date of birth: March 30, 1956 Age: 61 y.o. Gender: female  Primary Care Provider: Kerin Perna, NP Consultants: nephrology, vascular surgery, urology, psychiatry, cardiology Code Status: partial code (DNI, thinking about DNR)  [name]:  10/28 admitted to fpts  10/29 seen by vvs, permcath replaced, dialysis received, seen by urology 10/30 evaluated by psych 10/31 dialysis 11/01 myoview with ischemic findings 11/02 HD, cardiac catheterization 11/05 HD 11/07 HD 11/09 HD  11/12 HD  11/14 HD 11/16 HD  Assessment and Plan:  ESRD - dialysis per nephrology (MWF)  Bladder Mass Planned cystoscopy at  [facility] on 11/20. Concern that placement outside [facility] will limit patients ability to get to procedure. If not placed in ALF before 11/20, patient will need transport to procedure from [facility] to [place].  Patient has chronic indwelling foley 2/2 urinary retention. Patient has had intermittant urine leak, but no complaint today.  - Continue foley until outpatient follow for procedure with urology.  Type II diabetes CBGs stable. - sSSI - q 4 hour checks  FEN/GI: renal/carb modified, miralax PPx: heparin  Dispo: Medically stable for discharge, awaiting ALF placement. Last SW note mentions possible placement at [facility], which would require change in location of dialysis.   Subjective:  Patient says dialysis went well yesterday. She had questions about when urology procedure would be performed. Says bed is uncomfortable.   Objective: Temp:  [98.2 F (36.8 C)-98.9 F (37.2 C)] 98.2 F (36.8 C) (11/17 0515) Pulse Rate:  [58-66] 61 (11/17 0515) Resp:  [14-20] 14 (11/17 0515) BP: (117-165)/(49-74) 132/52 (11/17 0515) SpO2:  [91 %-95 %] 91 % (11/17 0515) Weight:  [124 lb 12.5 oz (56.6 kg)] 124 lb 12.5  oz (56.6 kg) (11/16 1035) Physical Exam: General: NAD, sitting up in chair CVS: RRR, loud SEM murmur Lungs: CTAB, no increased work of breathing Abdomen: Soft, nontender, nondistended MSK: no lower extremity edema, moves all limbs spontaneously  Rogue Bussing, MD 02/26/2017, 8:04 AM PGY-3, [place] Intern pager: [phone], text pages welcome

## 2017-02-27 LAB — RENAL FUNCTION PANEL
ALBUMIN: 3 g/dL — AB (ref 3.5–5.0)
ANION GAP: 12 (ref 5–15)
BUN: 88 mg/dL — ABNORMAL HIGH (ref 6–20)
CALCIUM: 9.5 mg/dL (ref 8.9–10.3)
CO2: 25 mmol/L (ref 22–32)
Chloride: 98 mmol/L — ABNORMAL LOW (ref 101–111)
Creatinine, Ser: 6.94 mg/dL — ABNORMAL HIGH (ref 0.44–1.00)
GFR calc non Af Amer: 6 mL/min — ABNORMAL LOW (ref >60)
GFR, EST AFRICAN AMERICAN: 7 mL/min — AB (ref >60)
GLUCOSE: 142 mg/dL — AB (ref 65–99)
PHOSPHORUS: 4.8 mg/dL — AB (ref 2.5–4.6)
POTASSIUM: 5.3 mmol/L — AB (ref 3.5–5.1)
Sodium: 135 mmol/L (ref 135–145)

## 2017-02-27 LAB — CBC
HEMATOCRIT: 29.3 % — AB (ref 36.0–46.0)
HEMOGLOBIN: 9.1 g/dL — AB (ref 12.0–15.0)
MCH: 29.5 pg (ref 26.0–34.0)
MCHC: 31.1 g/dL (ref 30.0–36.0)
MCV: 95.1 fL (ref 78.0–100.0)
Platelets: 142 10*3/uL — ABNORMAL LOW (ref 150–400)
RBC: 3.08 MIL/uL — AB (ref 3.87–5.11)
RDW: 19.3 % — ABNORMAL HIGH (ref 11.5–15.5)
WBC: 6.6 10*3/uL (ref 4.0–10.5)

## 2017-02-27 LAB — GLUCOSE, CAPILLARY
GLUCOSE-CAPILLARY: 258 mg/dL — AB (ref 65–99)
GLUCOSE-CAPILLARY: 246 mg/dL — AB (ref 65–99)
Glucose-Capillary: 164 mg/dL — ABNORMAL HIGH (ref 65–99)

## 2017-02-27 MED ORDER — HEPARIN SODIUM (PORCINE) 1000 UNIT/ML DIALYSIS: 2000.0000 [IU] | Freq: Once | INTRAMUSCULAR | Status: DC

## 2017-02-27 MED ORDER — LIDOCAINE HCL (PF) 1 % IJ SOLN: 5.0000 mL | INTRAMUSCULAR | Status: DC | PRN

## 2017-02-27 MED ORDER — LIDOCAINE-PRILOCAINE 2.5-2.5 % EX CREA: 1.0000 "application " | TOPICAL_CREAM | CUTANEOUS | Status: DC | PRN

## 2017-02-27 MED ORDER — PENTAFLUOROPROP-TETRAFLUOROETH EX AERO: 1.0000 "application " | INHALATION_SPRAY | CUTANEOUS | Status: DC | PRN

## 2017-02-27 MED ORDER — SODIUM CHLORIDE 0.9 % IV SOLN: 100.0000 mL | INTRAVENOUS | Status: DC | PRN

## 2017-02-27 MED ORDER — ALTEPLASE 2 MG IJ SOLR: 2.0000 mg | Freq: Once | INTRAMUSCULAR | Status: DC | PRN

## 2017-02-27 MED ORDER — HEPARIN SODIUM (PORCINE) 1000 UNIT/ML DIALYSIS: 1000.0000 [IU] | INTRAMUSCULAR | Status: DC | PRN

## 2017-02-27 MED ORDER — DARBEPOETIN ALFA 100 MCG/0.5ML IJ SOSY
100.0000 ug | PREFILLED_SYRINGE | INTRAMUSCULAR | Status: DC
  Filled 2017-02-27: qty 0.5

## 2017-02-27 NOTE — Progress Notes (Signed)
Subjective:  No cos , For hd today sec.Mar[facility] Kitchen Holiday schedule= Sun, tue , Friday hd this week  Objective Vital signs in last 24 hours: Vitals:   02/26/17 0945 02/26/17 1852 02/26/17 2326 02/27/17 0524  BP: 134/64 132/61 120/67 (!) 175/82  Pulse: 62 61 71 61  Resp: 15 16 16 20   Temp: 98.1 F (36.7 C) 98.3 F (36.8 C) 98.3 F (36.8 C) 97.8 F (36.6 C)  TempSrc: Oral Oral Oral Oral  SpO2: 92% 93% (!) 89% (!) 89%  Weight:      Height:       Weight change:   Physical Exam: General:  Sitting up in chair Awake, Alert,NAD Heart: S1, S2, RRR 3/6 systolic M.  Lungs: Clear Bilat. Abdomen: Soft NT, ND . Foley with greenish brown urine.  Extremities: R aka L transmet, trace L pedal  edema.  Dialysis Access: RIJ [facility]   Dialysis Orders: [place] MWF 4h 56.5kg 2/2.25 Hep 4000 R IJ TDC (new one placed 10/29) -hect 4 ug tiw -BMM: Fosrenol 1000mg  2 tabs tid    Problem/Plan: 1. AMS- resolved, suspected to be iatrogenicdue to pain meds 2. Possible SuicidalIdeation-seen by psychiatryon 10/30, no active SI , nomeds needed 3. Cracked HD cath - removed and replaced on day of admission 10/29 by VVS 4. Cardiac stress + for ischemia/ hx remote CABG- Coronary angiogram on11/2 showedpatent bypass grafts x 3  5. 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 6. ESRD - MWF HD. holiday schedule= sun, tue, fri/  Catheter-dependent, notacandidate for perm access due to severe PAD. 7. Volume/HTN- Blood pressuresstable, cont norvasc 5mg   Make hs , lowering dry wt. 8. Anemia of CKD- Hb 8.7.Aranesp 100 mcg now  q wed , . Tsat 61%. Follow HGB. 9. MBD of CKD- continue VDRA/Phos 4.8 (hospital diet) Hold binders. Ca 9.0 C Ca 9.9 decreased hectoral 3 mcg TIW Change to 2.0 Ca bath.  10. DMper primary 11. Possible UTI: urine cx no growth. Per primary.   Ernest Haber, PA-C [facility] Beeper  [phone] 02/27/2017,9:15 AM  LOS: 20 days   Pt seen, examined and agree w A/P as above.  [name] MD [facility] pager [phone]   02/27/2017, 11:56 AM     Labs: Basic Metabolic Panel: Recent Labs  Lab 02/21/17 0900 02/23/17 1820 02/25/17 0625  NA 132* 133* 134*  K 5.1 4.7 5.2*  CL 97* 98* 100*  CO2 25 25 25   GLUCOSE 155* 250* 198*  BUN 67* 64* 67*  CREATININE 7.42* 7.24* 6.71*  CALCIUM 9.9 9.2 9.0  PHOS 2.1* 4.7* 4.8*   Liver Function Tests: Recent Labs  Lab 02/21/17 0900 02/23/17 1820 02/25/17 0625  ALBUMIN 2.9* 2.9* 2.8*   No results for input(s): LIPASE, AMYLASE in the last 168 hours. No results for input(s): AMMONIA in the last 168 hours. CBC: Recent Labs  Lab 02/21/17 0900 02/23/17 0438 02/23/17 1820 02/25/17 0625  WBC 6.6 5.9 6.3 6.4  HGB 9.3* 8.7* 8.8* 8.7*  HCT 29.8* 27.4* 28.8* 27.9*  MCV 95.8 95.5 95.7 96.2  PLT 148* 154 148* 122*   Cardiac Enzymes: No results for input(s): CKTOTAL, CKMB, CKMBINDEX, TROPONINI in the last 168 hours. CBG: Recent Labs  Lab 02/26/17 0757 02/26/17 1154 02/26/17 1733 02/26/17 2216 02/27/17 0737  GLUCAP 181* 178* 157* 185* 164*    Studies/Results: No results found. Medications:  . amLODipine  5 mg Oral Daily  . aspirin EC  81  mg Oral BID  . atorvastatin  40 mg Oral QPM  . chlorhexidine  15 mL Mouth Rinse BID  . darbepoetin (ARANESP) injection - DIALYSIS  100 mcg Intravenous Q Wed-HD  . docusate sodium  100 mg Oral Daily  . doxercalciferol  3 mcg Intravenous Q M,W,F-HD  . folic acid  1 mg Oral Daily  . gabapentin  100 mg Oral BID  . heparin  5,000 Units Subcutaneous Q8H  . insulin aspart  0-9 Units Subcutaneous TID AC & HS  . levothyroxine  25 mcg Oral QAC breakfast  . mouth rinse  15 mL Mouth Rinse q12n4p  . metoprolol tartrate  25 mg Oral BID  . multivitamin with minerals  1 tablet Oral Daily  . sodium chloride flush  3 mL Intravenous Q12H  . thiamine  100 mg Oral Daily

## 2017-02-27 NOTE — Progress Notes (Signed)
Family Medicine Teaching Service Daily Progress Note Intern Pager: [phone]  Patient name: Brandi Arellano Medical record number: [id number] Date of birth: 1955-11-26 Age: 61 y.o. Gender: female  Primary Care Provider: Kerin Perna, NP Consultants: nephrology, vascular surgery, urology, psychiatry, cardiology Code Status: partial code (DNI, thinking about DNR)  [name]:  10/28 admitted to fpts  10/29 seen by vvs, permcath replaced, dialysis received, seen by urology 10/30 evaluated by psych 10/31 dialysis 11/01 myoview with ischemic findings 11/02 HD, cardiac catheterization 11/05 HD 11/07 HD 11/09 HD  11/12 HD  11/14 HD 11/16 HD  Assessment and Plan:  ESRD - dialysis per nephrology (MWF)  Bladder Mass Planned cystoscopy at  [facility] on 11/20. Concern that placement outside [facility] will limit patients ability to get to procedure. If not placed in ALF before 11/20, patient will need transport to procedure from [facility] to [place].  Patient has chronic indwelling foley 2/2 urinary retention. Patient has had intermittent urine leak, but no complaint today.  - Continue foley until outpatient follow for procedure with urology. - If foley continues to leak persistently, may need to consult urology while inpatient.  Type II diabetes CBGs stable. - sSSI - CBG monitoring with meals, qhs  FEN/GI: renal/carb modified, miralax PPx: heparin  Dispo: Medically stable for discharge, awaiting ALF placement. Last SW note mentions possible placement at [facility], which would require change in location of dialysis.   Subjective:  Pt feels well today. Stated informed by nephrology she will get HD today.  Objective: Temp:  [97.8 F (36.6 C)-98.3 F (36.8 C)] 97.8 F (36.6 C) (11/18 0524) Pulse Rate:  [61-71] 61 (11/18 0524) Resp:  [15-20] 20 (11/18 0524) BP: (120-175)/(61-82) 175/82 (11/18 0524) SpO2:  [89 %-93 %] 89 % (11/18  0524)  Physical Exam: General: NAD, sitting up in chair CVS: RRR, loud systolic murmur Lungs: CTAB, no increased work of breathing Abdomen: Soft, nontender, nondistended MSK: no lower extremity edema, moves all limbs spontaneously  Rory Percy, DO 02/27/2017, 8:45 AM PGY-1, [place] Intern pager: [phone], text pages welcome

## 2017-02-27 NOTE — Progress Notes (Signed)
Dialysis treatment completed.  2500 mL ultrafiltrated.  2000 mL net fluid removal.  Patient status unchanged. Lung sounds diminished to ausculation in all fields. Generalized edema. Cardiac: NSR.  Cleansed RIJ catheter with chlorhexidine.  Disconnected lines and flushed ports with saline per protocol.  Ports locked with heparin and capped per protocol.    Report given to bedside, RN Albert. 

## 2017-02-27 NOTE — Progress Notes (Signed)
Patient arrived to unit by bed.  Reviewed treatment plan and this RN agrees with plan.  Report received from bedside RN, Gwenlyn Perking.  Consent verified.  Patient A & O X 4.   Lung sounds diminished and clear to ausculation in all fields. Generalized edema. Cardiac:  NSR.  Removed caps and cleansed RIJ catheter with chlorhedxidine.  Aspirated ports of heparin and flushed them with saline per protocol.  Connected and secured lines, initiated treatment at 1130.  UF Goal of 2500 mL and net fluid removal 2 L.  Will continue to monitor.

## 2017-02-28 LAB — GLUCOSE, CAPILLARY
GLUCOSE-CAPILLARY: 199 mg/dL — AB (ref 65–99)
Glucose-Capillary: 159 mg/dL — ABNORMAL HIGH (ref 65–99)
Glucose-Capillary: 117 mg/dL — ABNORMAL HIGH (ref 65–99)
Glucose-Capillary: 231 mg/dL — ABNORMAL HIGH (ref 65–99)

## 2017-02-28 MED ORDER — GENTAMICIN IN SALINE 1.6-0.9 MG/ML-% IV SOLN
80.0000 mg | INTRAVENOUS | Status: AC
  Administered 2017-03-01: 80 mg via INTRAVENOUS
  Filled 2017-02-28 (×2): qty 50

## 2017-02-28 MED ORDER — CEFAZOLIN SODIUM-DEXTROSE 2-4 GM/100ML-% IV SOLN
2.0000 g | INTRAVENOUS | Status: AC
  Administered 2017-03-01: 2 g via INTRAVENOUS
  Filled 2017-02-28: qty 100

## 2017-02-28 NOTE — Progress Notes (Signed)
Patient says MD still needs to talk with her before she can sign consent

## 2017-02-28 NOTE — Progress Notes (Signed)
[facility] Progress Note   Subjective: Says she is still awaiting chair at [facility] unit to be [place] to [facility], [place], [place]. No C/Os. For cysto tomorrow off site (at [facility])  ?   Objective Vitals:   02/27/17 1840 02/27/17 2019 02/28/17 0513 02/28/17 0800  BP: (!) 172/84 (!) 125/55 (!) 136/54 (!) 168/51  Pulse: 65 63 61 (!) 59  Resp: 20 16 13 18   Temp: 98 F (36.7 C) 98.3 F (36.8 C) 98.2 F (36.8 C) 97.9 F (36.6 C)  TempSrc: Oral   Oral  SpO2: 98% 93% 93% 96%  Weight:      Height:       Physical Exam General: Pleasant, NAD- room smells like urine Heart: I4,P8 3/6 systolic M Lungs: CTAB A/P Abdomen: Foley patent, brown-green urine. Still malordrous.  Extremities: No LE edema.  Dialysis Access: RIJ [facility] CDI.    Additional Objective Labs: Basic Metabolic Panel: Recent Labs  Lab 02/23/17 1820 02/25/17 0625 02/27/17 1154  NA 133* 134* 135  K 4.7 5.2* 5.3*  CL 98* 100* 98*  CO2 25 25 25   GLUCOSE 250* 198* 142*  BUN 64* 67* 88*  CREATININE 7.24* 6.71* 6.94*  CALCIUM 9.2 9.0 9.5  PHOS 4.7* 4.8* 4.8*   Liver Function Tests: Recent Labs  Lab 02/23/17 1820 02/25/17 0625 02/27/17 1154  ALBUMIN 2.9* 2.8* 3.0*   No results for input(s): LIPASE, AMYLASE in the last 168 hours. CBC: Recent Labs  Lab 02/23/17 0438 02/23/17 1820 02/25/17 0625 02/27/17 1155  WBC 5.9 6.3 6.4 6.6  HGB 8.7* 8.8* 8.7* 9.1*  HCT 27.4* 28.8* 27.9* 29.3*  MCV 95.5 95.7 96.2 95.1  PLT 154 148* 122* 142*   Blood Culture    Component Value Date/Time   SDES URINE, CATHETERIZED 02/24/2017 1429   SPECREQUEST NONE 02/24/2017 1429   CULT NO GROWTH 02/24/2017 1429   REPTSTATUS 02/25/2017 FINAL 02/24/2017 1429    Cardiac Enzymes: No results for input(s): CKTOTAL, CKMB, CKMBINDEX, TROPONINI in the last 168 hours. CBG: Recent Labs  Lab 02/26/17 2216 02/27/17 0737 02/27/17 1648 02/27/17 2128 02/28/17 0759  GLUCAP 185* 164* 258* 246* 159*   Iron  Studies: No results for input(s): IRON, TIBC, TRANSFERRIN, FERRITIN in the last 72 hours. @lablastinr3 @ Studies/Results: No results found. Medications:  . amLODipine  5 mg Oral Daily  . aspirin EC  81 mg Oral BID  . atorvastatin  40 mg Oral QPM  . chlorhexidine  15 mL Mouth Rinse BID  . [START ON 03/04/2017] darbepoetin (ARANESP) injection - DIALYSIS  100 mcg Intravenous Q Fri-HD  . docusate sodium  100 mg Oral Daily  . doxercalciferol  3 mcg Intravenous Q M,W,F-HD  . folic acid  1 mg Oral Daily  . gabapentin  100 mg Oral BID  . heparin  5,000 Units Subcutaneous Q8H  . insulin aspart  0-9 Units Subcutaneous TID AC & HS  . levothyroxine  25 mcg Oral QAC breakfast  . mouth rinse  15 mL Mouth Rinse q12n4p  . metoprolol tartrate  25 mg Oral BID  . multivitamin with minerals  1 tablet Oral Daily  . sodium chloride flush  3 mL Intravenous Q12H  . thiamine  100 mg Oral Daily     Dialysis [facility]: [facility] 4h 56.5kg 2/2.25 400/auto 1.5  Linear sodium UFP 4 -Hep 4000 R IJ TDC (new one placed 10/29) -hect 4 ug tiw -BMM: Fosrenol 1000mg  2 tabs tid    Problem/Plan: 1. AMS- resolved, suspected  to be iatrogenicdue to pain meds 2. PossibleSuicidalIdeation-seen by psychiatryon 10/30, no active SI , nomeds needed 3. Cracked HD cath - removed and replaced on day of admission 10/29 by VVS 4. Cardiac stress + for ischemia/ hx remote CABG- Coronary angiogram on11/2 showedpatent bypass grafts x 3  5. 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 6. ESRD - MWF HD. holiday schedule= sun, tue, fri/  Catheter-dependent, notacandidate for perm access due to severe PAD. Next HD 03/01/2017.  7. Volume/HTN- Blood pressuresstable, cont norvasc 5mg   Make hs , lowering dry wt. Last post wt 55 kg.  BP slightly volatile but looks good at this wt.  8. Anemia of CKD- Hb 8.7.Aranesp 100 mcg now  q wed.  .  Tsat 61%. Follow HGB. 9. MBD of CKD- continue VDRA/Phos 4.8 (hospital diet) Hold binders. Ca 9.0 C Ca 9.9 decreased hectoral 3 mcg TIW Change to 2.0 Ca bath.  10. DMper primary 11. Possible UTI: urine cx no growth. Per primary.   Rita H. Brown NP-C 02/28/2017, 9:48 AM  [facility] [phone]  Patient seen and examined, agree with above note with above modifications. Pt without complaint.  Awaiting for OP dialysis arrangements but also with cysto tomorrow per urology.  HD tomorrow via PC for holiday schedule as well  Corliss Parish, MD 02/28/2017

## 2017-02-28 NOTE — Progress Notes (Signed)
Family Medicine Teaching Service Daily Progress Note Intern Pager: [phone]  Patient name: Brandi Arellano Medical record number: [id number] Date of birth: June 30, 1955 Age: 61 y.o. Gender: female  Primary Care Provider: Kerin Perna, NP Consultants: nephrology, vascular surgery, urology, psychiatry, cardiology Code Status: partial code (DNI, thinking about DNR)  [name]:  10/28 admitted to fpts  10/29 seen by vvs, permcath replaced, dialysis received, seen by urology 10/30 evaluated by psych 10/31 dialysis 11/01 myoview with ischemic findings 11/02 HD, cardiac catheterization 11/05 HD 11/07 HD 11/09 HD  11/12 HD  11/14 HD 11/16 HD 11/18 HD  Assessment and Plan:  ESRD - dialysis per nephrology  - schedule switched to Sunday Tuesday Friday due to holiday this week   Bladder Mass Planned cystoscopy at  [facility] on 11/20. Concern that placement outside [facility] will limit patients ability to get to procedure. If not placed in ALF before 11/20, patient will need transport to procedure from [facility] to [place]. Patient has chronic indwelling foley 2/2 urinary retention. Patient has had intermittent urine leak, but no complaint today.  - Continue foley until outpatient follow for procedure with urology. - If foley continues to leak persistently, may need to consult urology while inpatient. - appreciate CM/SW coordinating transport to planned cystoscopy tomorrow  Type II diabetes CBGs mildy elevated from baseline past 24 hrs. 164-246.  - sSSI - CBG monitoring with meals, qhs  FEN/GI: renal/carb modified, miralax PPx: heparin  Dispo: Medically stable for discharge, awaiting ALF placement. Last SW note mentions possible placement at [facility], which would require change in location of dialysis.   Subjective:  Patient is concerned that she will not have transport to her bladder procedure tomorrow. No other complaints Objective: Temp:   [97.6 F (36.4 C)-98.3 F (36.8 C)] 98.2 F (36.8 C) (11/19 0513) Pulse Rate:  [54-70] 61 (11/19 0513) Resp:  [13-20] 13 (11/19 0513) BP: (125-206)/(54-101) 136/54 (11/19 0513) SpO2:  [93 %-99 %] 93 % (11/19 0513) Weight:  [121 lb 4.1 oz (55 kg)-125 lb 10.6 oz (57 kg)] 121 lb 4.1 oz (55 kg) (11/18 1500)  Physical Exam: General: NAD, resting in bed CVS: RRR, loud systolic murmur Lungs: CTAB, no increased work of breathing Abdomen: Soft, nontender, nondistended MSK: no lower extremity edema, moves all limbs spontaneously  Bonnita Hollow, MD 02/28/2017, 6:22 AM PGY-1, [place] Intern pager: [phone], text pages welcome

## 2017-02-28 NOTE — Progress Notes (Signed)
Got information from [facility] short stay that pt is scheduled for procedure tomorrow at 9:15 and wants to be at the short stay by 7:30 am.  [facility] and set up the transport for the patient, gave the information about the time the pt needs to be present and the doctor name.  Notified the nurse regarding the information.

## 2017-02-28 NOTE — Care Management Note (Signed)
Case Management Note  Patient Details  Name: Sundi Slevin MRN: [id number] Date of Birth: [birth year]  Subjective/Objective:      CM following for progression and d/c planning.               Action/Plan: 02/28/2017 Noted plan for pt to transport to [facility] for Cysto tomorrow 03/01/2017. This has been arrange by charge RN , Ulice Dash. CM/CSW do not arrange transport for procedures. Noted ongoing efforts to locate accepting SNF and change in HD center depending upon SNF placement.   Expected Discharge Date:                  Expected Discharge Plan:  Skilled Nursing Facility  In-House Referral:  Clinical Social Work  Discharge planning Services  CM Consult  Post Acute Care Choice:  NA Choice offered to:  NA  DME Arranged:  N/A DME Agency:  NA  HH Arranged:  NA HH Agency:  NA  Status of Service:  Completed, signed off  If discussed at [place] of Stay Meetings, dates discussed:    Additional Comments:  Adron Bene, RN 02/28/2017, 4:35 PM

## 2017-02-28 NOTE — Progress Notes (Signed)
CSW left voicemail with [employer] checking on referral status.  Percell Locus Chalisa Kobler LCSWA [phone]

## 2017-02-28 NOTE — Progress Notes (Signed)
Upon chart review, covering CSW notes patient has a procedure tomorrow at [facility]. Patient will not be placed in SNF before the procedure.   Brandi Arellano LCSWA [phone]

## 2017-03-01 ENCOUNTER — Inpatient Hospital Stay (HOSPITAL_COMMUNITY): Payer: Medicaid Other | Admitting: Certified Registered"

## 2017-03-01 ENCOUNTER — Encounter (HOSPITAL_COMMUNITY)
Admission: EM | Disposition: A | Discharge status: Home / Self Care | Payer: Self-pay | Source: Home / Self Care | Attending: Family Medicine

## 2017-03-01 ENCOUNTER — Inpatient Hospital Stay (HOSPITAL_COMMUNITY): Payer: Medicaid Other

## 2017-03-01 ENCOUNTER — Encounter (HOSPITAL_COMMUNITY): Payer: Self-pay

## 2017-03-01 DIAGNOSIS — D494 Neoplasm of unspecified behavior of bladder: Secondary | ICD-10-CM | POA: Insufficient documentation

## 2017-03-01 DIAGNOSIS — E1122 Type 2 diabetes mellitus with diabetic chronic kidney disease: Secondary | ICD-10-CM

## 2017-03-01 DIAGNOSIS — Z992 Dependence on renal dialysis: Secondary | ICD-10-CM

## 2017-03-01 DIAGNOSIS — N186 End stage renal disease: Secondary | ICD-10-CM

## 2017-03-01 DIAGNOSIS — Z794 Long term (current) use of insulin: Secondary | ICD-10-CM

## 2017-03-01 HISTORY — DX: Neoplasm of unspecified behavior of bladder: D49.4

## 2017-03-01 HISTORY — PX: TRANSURETHRAL RESECTION OF BLADDER TUMOR: SHX2575

## 2017-03-01 HISTORY — PX: CYSTOSCOPY W/ RETROGRADES: SHX1426

## 2017-03-01 LAB — POCT I-STAT 4, (NA,K, GLUC, HGB,HCT)
Glucose, Bld: 148 mg/dL — ABNORMAL HIGH (ref 65–99)
HEMATOCRIT: 29 % — AB (ref 36.0–46.0)
HEMOGLOBIN: 9.9 g/dL — AB (ref 12.0–15.0)
Potassium: 5.2 mmol/L — ABNORMAL HIGH (ref 3.5–5.1)
Sodium: 135 mmol/L (ref 135–145)

## 2017-03-01 LAB — POTASSIUM: POTASSIUM: 5.4 mmol/L — AB (ref 3.5–5.1)

## 2017-03-01 LAB — HEMOGLOBIN AND HEMATOCRIT, BLOOD
HCT: 28.1 % — ABNORMAL LOW (ref 36.0–46.0)
Hemoglobin: 9 g/dL — ABNORMAL LOW (ref 12.0–15.0)

## 2017-03-01 LAB — GLUCOSE, CAPILLARY
GLUCOSE-CAPILLARY: 116 mg/dL — AB (ref 65–99)
GLUCOSE-CAPILLARY: 162 mg/dL — AB (ref 65–99)
Glucose-Capillary: 118 mg/dL — ABNORMAL HIGH (ref 65–99)
Glucose-Capillary: 135 mg/dL — ABNORMAL HIGH (ref 65–99)
Glucose-Capillary: 203 mg/dL — ABNORMAL HIGH (ref 65–99)

## 2017-03-01 SURGERY — TURBT (TRANSURETHRAL RESECTION OF BLADDER TUMOR)
Anesthesia: General | Site: Bladder

## 2017-03-01 MED ORDER — LIDOCAINE 2% (20 MG/ML) 5 ML SYRINGE
INTRAMUSCULAR | Status: AC
  Filled 2017-03-01: qty 5

## 2017-03-01 MED ORDER — HYDROMORPHONE HCL 1 MG/ML IJ SOLN
INTRAMUSCULAR | Status: AC
  Filled 2017-03-01: qty 2

## 2017-03-01 MED ORDER — ONDANSETRON HCL 4 MG/2ML IJ SOLN
INTRAMUSCULAR | Status: AC
  Filled 2017-03-01: qty 2

## 2017-03-01 MED ORDER — IOHEXOL 300 MG/ML  SOLN
INTRAMUSCULAR | Status: DC | PRN
  Administered 2017-03-01: 14 mL via URETHRAL

## 2017-03-01 MED ORDER — FENTANYL CITRATE (PF) 100 MCG/2ML IJ SOLN
INTRAMUSCULAR | Status: AC
  Filled 2017-03-01: qty 2

## 2017-03-01 MED ORDER — SODIUM POLYSTYRENE SULFONATE 15 GM/60ML PO SUSP
30.0000 g | Freq: Once | ORAL | Status: AC
  Administered 2017-03-01: 30 g via ORAL
  Filled 2017-03-01: qty 120

## 2017-03-01 MED ORDER — HYDROMORPHONE HCL 1 MG/ML IJ SOLN
0.2500 mg | INTRAMUSCULAR | Status: DC | PRN
  Administered 2017-03-01: 0.5 mg via INTRAVENOUS

## 2017-03-01 MED ORDER — PROPOFOL 10 MG/ML IV BOLUS
INTRAVENOUS | Status: AC
  Filled 2017-03-01: qty 20

## 2017-03-01 MED ORDER — FENTANYL CITRATE (PF) 100 MCG/2ML IJ SOLN
INTRAMUSCULAR | Status: DC | PRN
  Administered 2017-03-01: 50 ug via INTRAVENOUS
  Administered 2017-03-01: 25 ug via INTRAVENOUS
  Administered 2017-03-01: 50 ug via INTRAVENOUS
  Administered 2017-03-01: 25 ug via INTRAVENOUS

## 2017-03-01 MED ORDER — SODIUM CHLORIDE 0.9 % IV SOLN
1.0000 g | Freq: Once | INTRAVENOUS | Status: AC
  Administered 2017-03-01: 1 g via INTRAVENOUS
  Filled 2017-03-01: qty 10

## 2017-03-01 MED ORDER — INSULIN ASPART 100 UNIT/ML ~~LOC~~ SOLN
10.0000 [IU] | Freq: Once | SUBCUTANEOUS | Status: AC
  Administered 2017-03-01: 10 [IU] via SUBCUTANEOUS

## 2017-03-01 MED ORDER — SODIUM CHLORIDE 0.9 % IV SOLN
INTRAVENOUS | Status: DC
  Administered 2017-03-01: 1000 mL via INTRAVENOUS

## 2017-03-01 MED ORDER — CEFAZOLIN SODIUM-DEXTROSE 2-4 GM/100ML-% IV SOLN
INTRAVENOUS | Status: AC
  Filled 2017-03-01: qty 100

## 2017-03-01 MED ORDER — ONDANSETRON HCL 4 MG/2ML IJ SOLN: 4.0000 mg | Freq: Once | INTRAMUSCULAR | Status: DC | PRN

## 2017-03-01 MED ORDER — BELLADONNA ALKALOIDS-OPIUM 16.2-60 MG RE SUPP
1.0000 | Freq: Four times a day (QID) | RECTAL | Status: DC | PRN
  Filled 2017-03-01: qty 1

## 2017-03-01 MED ORDER — PROPOFOL 10 MG/ML IV BOLUS
INTRAVENOUS | Status: DC | PRN
  Administered 2017-03-01: 110 mg via INTRAVENOUS

## 2017-03-01 MED ORDER — ONDANSETRON HCL 4 MG/2ML IJ SOLN
INTRAMUSCULAR | Status: DC | PRN
  Administered 2017-03-01: 4 mg via INTRAVENOUS

## 2017-03-01 MED ORDER — DEXTROSE 50 % IV SOLN
1.0000 | Freq: Once | INTRAVENOUS | Status: AC
  Administered 2017-03-01: 50 mL via INTRAVENOUS
  Filled 2017-03-01: qty 50

## 2017-03-01 MED ORDER — LIDOCAINE 2% (20 MG/ML) 5 ML SYRINGE
INTRAMUSCULAR | Status: DC | PRN
  Administered 2017-03-01: 50 mg via INTRAVENOUS

## 2017-03-01 MED ORDER — ALBUTEROL SULFATE (2.5 MG/3ML) 0.083% IN NEBU
2.5000 mg | INHALATION_SOLUTION | Freq: Once | RESPIRATORY_TRACT | Status: AC
  Administered 2017-03-01: 2.5 mg via RESPIRATORY_TRACT
  Filled 2017-03-01: qty 3

## 2017-03-01 MED ORDER — MEPERIDINE HCL 50 MG/ML IJ SOLN: 6.2500 mg | INTRAMUSCULAR | Status: DC | PRN

## 2017-03-01 SURGICAL SUPPLY — 35 items
BAG URINE DRAINAGE (UROLOGICAL SUPPLIES) ×3 IMPLANT
BAG URO CATCHER STRL LF (MISCELLANEOUS) ×5 IMPLANT
BASKET STONE NCOMPASS (UROLOGICAL SUPPLIES) IMPLANT
CATH FOLEY 3WAY 30CC 22FR (CATHETERS) IMPLANT
CATH FOLEY 3WAY 30CC 24FR (CATHETERS) ×5
CATH URET 5FR 28IN OPEN ENDED (CATHETERS) ×3 IMPLANT
CATH URET DUAL LUMEN 6-10FR 50 (CATHETERS) ×2 IMPLANT
CATH URTH STD 24FR FL 3W 2 (CATHETERS) ×1 IMPLANT
CLOTH BEACON ORANGE TIMEOUT ST (SAFETY) ×5 IMPLANT
COVER FOOTSWITCH UNIV (MISCELLANEOUS) ×3 IMPLANT
COVER SURGICAL LIGHT HANDLE (MISCELLANEOUS) ×2 IMPLANT
EXTRACTOR STONE NITINOL NGAGE (UROLOGICAL SUPPLIES) ×2 IMPLANT
FIBER LASER FLEXIVA 1000 (UROLOGICAL SUPPLIES) IMPLANT
FIBER LASER FLEXIVA 365 (UROLOGICAL SUPPLIES) IMPLANT
FIBER LASER FLEXIVA 550 (UROLOGICAL SUPPLIES) IMPLANT
FIBER LASER TRAC TIP (UROLOGICAL SUPPLIES) IMPLANT
GLOVE SURG SS PI 8.0 STRL IVOR (GLOVE) ×3 IMPLANT
GOWN STRL REUS W/TWL XL LVL3 (GOWN DISPOSABLE) ×5 IMPLANT
GUIDEWIRE STR DUAL SENSOR (WIRE) ×5 IMPLANT
HOLDER FOLEY CATH W/STRAP (MISCELLANEOUS) ×3 IMPLANT
IV NS 1000ML (IV SOLUTION) ×5
IV NS 1000ML BAXH (IV SOLUTION) ×3 IMPLANT
IV NS IRRIG 3000ML ARTHROMATIC (IV SOLUTION) ×2 IMPLANT
LOOP CUT BIPOLAR 24F LRG (ELECTROSURGICAL) ×3 IMPLANT
MANIFOLD NEPTUNE II (INSTRUMENTS) ×5 IMPLANT
NS IRRIG 1000ML POUR BTL (IV SOLUTION) ×3 IMPLANT
PACK CYSTO (CUSTOM PROCEDURE TRAY) ×5 IMPLANT
SET ASPIRATION TUBING (TUBING) ×2 IMPLANT
SHEATH URETERAL 12FRX35CM (MISCELLANEOUS) ×2 IMPLANT
SUT ETHILON 3 0 PS 1 (SUTURE) IMPLANT
SYR 30ML LL (SYRINGE) ×6 IMPLANT
SYRINGE IRR TOOMEY STRL 70CC (SYRINGE) ×3 IMPLANT
TUBING CONNECTING 10 (TUBING) ×4 IMPLANT
TUBING CONNECTING 10' (TUBING) ×1
WATER STERILE IRR 500ML POUR (IV SOLUTION) ×3 IMPLANT

## 2017-03-01 NOTE — Interval H&P Note (Signed)
History and Physical Interval Note:  Urine culture negative on 02/24/17.   Patient reports malodorous urine.   Hgb 9.1 on 11/18.  03/01/2017 9:15 AM  [name]  has presented today for surgery, with the diagnosis of BLADDER TUMOR  The various methods of treatment have been discussed with the patient and family. After consideration of risks, benefits and other options for treatment, the patient has consented to  Procedure(s): CYSTOSCOPY BILATERAL RETROGRADE/URETEROSCOPY (Bilateral) TRANSURETHRAL RESECTION OF BLADDER TUMOR (TURBT) (N/A) as a surgical intervention .  The patient's history has been reviewed, patient examined, no change in status, stable for surgery.  I have reviewed the patient's chart and labs.  Questions were answered to the patient's satisfaction.     Brandi Arellano

## 2017-03-01 NOTE — Anesthesia Procedure Notes (Signed)
Procedure Name: LMA Insertion Date/Time: 03/01/2017 9:37 AM Performed by: Damar Petit D, CRNA Pre-anesthesia Checklist: Patient identified, Emergency Drugs available, Suction available and Patient being monitored Patient Re-evaluated:Patient Re-evaluated prior to induction Oxygen Delivery Method: Circle system utilized Preoxygenation: Pre-oxygenation with 100% oxygen Induction Type: IV induction Ventilation: Mask ventilation without difficulty LMA: LMA inserted LMA Size: 3.0 Tube type: Oral Number of attempts: 1 Placement Confirmation: positive ETCO2 and breath sounds checked- equal and bilateral Tube secured with: Tape Dental Injury: Teeth and Oropharynx as per pre-operative assessment

## 2017-03-01 NOTE — Progress Notes (Signed)
Report called to [facility], RN at [employer] [facility] long

## 2017-03-01 NOTE — Progress Notes (Signed)
Family Medicine Teaching Service Daily Progress Note Intern Pager: [phone]  Patient name: Brandi Arellano Medical record number: [id number] Date of birth: 03/26/1956 Age: 61 y.o. Gender: female  Primary Care Provider: Kerin Perna, NP Consultants: nephrology, vascular surgery, urology, psychiatry, cardiology Code Status: partial code (DNI, thinking about DNR)  [name]:  10/28 admitted to fpts  10/29 seen by vvs, permcath replaced, dialysis received, seen by urology 10/30 evaluated by psych 10/31 dialysis 11/01 myoview with ischemic findings 11/02 HD, cardiac catheterization 11/05 HD 11/07 HD 11/09 HD  11/12 HD  11/14 HD 11/16 HD 11/18 HD 11/20 - Cystoscopy @ [facility]. HD planned for today  Assessment and Plan:  ESRD - dialysis per nephrology  - schedule switched to Sunday Tuesday Friday due to holiday this week   Bladder Mass Currently at cystoscopy at  [facility] on 11/20.  - urology recommendations - appreciate CM/SW coordinating transport to planned cystoscopy tomorrow  Type II diabetes CBGs mildy elevated from baseline past 24 hrs. 164-246.  - sSSI - CBG monitoring with meals, qhs  FEN/GI: renal/carb modified, miralax PPx: heparin  Dispo: Medically stable for discharge, awaiting ALF placement. Last SW note mentions possible placement at [facility], which would require change in location of dialysis.   Subjective:  Patient away at cystoscopy. Will have another team member evaluate after return from procedure.   Objective: Temp:  [98 F (36.7 C)-98.5 F (36.9 C)] 98.5 F (36.9 C) (11/20 0433) Pulse Rate:  [57-66] 66 (11/20 0433) Resp:  [18-20] 20 (11/20 0433) BP: (129-147)/(47-57) 147/53 (11/20 0433) SpO2:  [90 %-95 %] 90 % (11/20 0433) Weight:  [121 lb 14.6 oz (55.3 kg)] 121 lb 14.6 oz (55.3 kg) (11/19 2049)  Physical Exam:   Bonnita Hollow, MD 03/01/2017, 9:02 AM PGY-1, [place]  Intern pager: [phone], text pages welcome

## 2017-03-01 NOTE — Progress Notes (Signed)
[facility] Progress Note   Subjective: Seen preparing for transport to [facility] for cystoscopy per [facility]. No C/Os. Cheerful. Seen after procedure- large bladder mass that was not completely resectable- staying at [facility]  Objective Vitals:   02/28/17 0800 02/28/17 1700 02/28/17 2049 03/01/17 0433  BP: (!) 168/51 (!) 129/57 (!) 137/47 (!) 147/53  Pulse: (!) 59 62 (!) 57 66  Resp: 18 18 19 20   Temp: 97.9 F (36.6 C) 98 F (36.7 C) 98.4 F (36.9 C) 98.5 F (36.9 C)  TempSrc: Oral Oral Oral Oral  SpO2: 96% 95% 92% 90%  Weight:   55.3 kg (121 lb 14.6 oz)   Height:       Physical Exam General: Pleasant, NAD Heart: A6,T0, 3/6 systolic M Lungs: CTAB A/P Abdomen: active BS. Foley patent with brown-green foul-smelling urine Extremities: Trace pre-tib edema BLE Dialysis Access: RIJ TDC drsg CDI    Additional Objective Labs: Basic Metabolic Panel: Recent Labs  Lab 02/23/17 1820 02/25/17 0625 02/27/17 1154  NA 133* 134* 135  K 4.7 5.2* 5.3*  CL 98* 100* 98*  CO2 25 25 25   GLUCOSE 250* 198* 142*  BUN 64* 67* 88*  CREATININE 7.24* 6.71* 6.94*  CALCIUM 9.2 9.0 9.5  PHOS 4.7* 4.8* 4.8*   Liver Function Tests: Recent Labs  Lab 02/23/17 1820 02/25/17 0625 02/27/17 1154  ALBUMIN 2.9* 2.8* 3.0*   No results for input(s): LIPASE, AMYLASE in the last 168 hours. CBC: Recent Labs  Lab 02/23/17 0438 02/23/17 1820 02/25/17 0625 02/27/17 1155  WBC 5.9 6.3 6.4 6.6  HGB 8.7* 8.8* 8.7* 9.1*  HCT 27.4* 28.8* 27.9* 29.3*  MCV 95.5 95.7 96.2 95.1  PLT 154 148* 122* 142*   Blood Culture    Component Value Date/Time   SDES URINE, CATHETERIZED 02/24/2017 1429   SPECREQUEST NONE 02/24/2017 1429   CULT NO GROWTH 02/24/2017 1429   REPTSTATUS 02/25/2017 FINAL 02/24/2017 1429    Cardiac Enzymes: No results for input(s): CKTOTAL, CKMB, CKMBINDEX, TROPONINI in the last 168 hours. CBG: Recent Labs  Lab 02/28/17 0759 02/28/17 1134 02/28/17 1643 02/28/17 2041  03/01/17 0436  GLUCAP 159* 199* 117* 231* 116*   Iron Studies: No results for input(s): IRON, TIBC, TRANSFERRIN, FERRITIN in the last 72 hours. @lablastinr3 @ Studies/Results: No results found. Medications: .  ceFAZolin (ANCEF) IV    . gentamicin     . amLODipine  5 mg Oral Daily  . aspirin EC  81 mg Oral BID  . atorvastatin  40 mg Oral QPM  . chlorhexidine  15 mL Mouth Rinse BID  . [START ON 03/04/2017] darbepoetin (ARANESP) injection - DIALYSIS  100 mcg Intravenous Q Fri-HD  . docusate sodium  100 mg Oral Daily  . doxercalciferol  3 mcg Intravenous Q M,W,F-HD  . folic acid  1 mg Oral Daily  . gabapentin  100 mg Oral BID  . heparin  5,000 Units Subcutaneous Q8H  . insulin aspart  0-9 Units Subcutaneous TID AC & HS  . levothyroxine  25 mcg Oral QAC breakfast  . mouth rinse  15 mL Mouth Rinse q12n4p  . metoprolol tartrate  25 mg Oral BID  . multivitamin with minerals  1 tablet Oral Daily  . sodium chloride flush  3 mL Intravenous Q12H  . thiamine  100 mg Oral Daily     Dialysis Orders: [facility] M[facility] 4h 56.5kg 2/2.25 400/auto 1.5  Linear sodium UFP 4 -Hep 4000 R IJ TDC (new one placed 10/29) -hect 4 ug tiw -  BMM: Fosrenol 1000mg  2 tabs tid    Problem/Plan: 1. AMS- resolved, suspected to be iatrogenicdue to pain meds 2. PossibleSuicidalIdeation-seen by psychiatryon 10/30, no active SI , nomeds needed. Unfortunately this has made SNF placement difficult. 3. Cracked HD cath - removed and replaced on day of admission 10/29 by [facility] 4. Cardiac stress + for ischemia/ hx remote CABG- Coronary angiogram on11/2 showedpatent bypass grafts x 3  5. Bladder mass withvoiding difficulty/ chronic indwelling foley- Transported to [facility] short stay for   S/p debulking today 6. ESRD - MWF HD.holiday schedule= sun, tue, fri/Catheter-dependent, notacandidate for perm access due to severe PAD. Was due for HD today but since at [facility] getting [facility]- will postpone til tomorrow. She will  need [facility] to bring her over tomorrow.  I will check potassium to make sure not getting too high tonight   7. Volume/HTN- Blood pressuresstable, cont norvasc5mg  Make hs , lowering dry wt. Last post wt 55 kg.  BP slightly volatile but looks good at this wt.  8. Anemia of CKD- Hb 8.7.Aranesp 100 mcg now q wed.  . Tsat 61%. Follow HGB. 9. MBD of CKD- continue VDRA/Phos4.8(hospital diet) Hold binders. Ca 9.0C Ca 9.9decreasedhectoral 3 mcg TIW Change to 2.0 Ca bath.  10. DMper primary      Jimmye Norman. Brown NP-C 03/01/2017, 8:17 AM  [facility] [phone]  Patient seen and examined, agree with above note with above modifications. S/p debulking of bladder tumor- slightly confused.   Was due for HD today via holiday schedule BUT settled here at [facility]- will postpone til tomorrow- check K tonight and act as needed  Corliss Parish, MD 03/01/2017

## 2017-03-01 NOTE — Op Note (Signed)
Procedure: 1.  Cystoscopy with left retrograde pyelogram and interpretation. 2.  Transurethral resection of large bladder tumor.  Preoperative diagnosis: Bladder mass.  Postoperative diagnosis: Large probable squamous cell neoplasm of the bladder neck and overlapping sites greater than 5 cm.  Surgeon: Dr. Irine Seal.  Anesthesia: General.  Specimen: Bladder tumor chips.  Blood loss: Approximately 100 mL.  Drain: 79 [place] three-way Foley catheter.    Findings: Extensive tumor involving the bladder neck and overlapping sites into the left trigone consistent with an invasive squamous cell bladder cancer.  Complications: None.  Indications: Brandi Arellano is a 62 year old female with end-stage renal disease on dialysis.  She was recently admitted to the hospital and is part of her evaluation she had a CT scan of the abdomen and pelvis.  The CT scan demonstrated 2 probable bladder masses with enhancement.  One anteriorly and one posteriorly.  It was felt that cystoscopy with retrograde pyelography and resection of the masses was indicated.  She has been treated for infection during her hospitalization and a repeat urine culture on 02/24/2017 was negative.  Procedure: She was taken to the operating room and given Ancef and gentamicin.  A general anesthetic was induced and she was placed in the lithotomy position.  Her perineum and genitalia were prepped with Betadine solution and she was draped in the usual sterile fashion.  Cystoscopy was performed using the 23 [place] scope and 30 degree lens.  Inspection revealed a normal anterior urethra but the bladder neck there were lesions anteriorly and posteriorly with considerable keratinization worrisome for a squamous cell neoplasia.  On closer inspection the masses were contiguous and involved the bladder neck circumferentially.  Inspection of the bladder demonstrated no trabeculation.  There were squamous changes in the right trigone that were  worrisome for neoplastic spread in this area.  The right ureteral orifice was not identified.  The left ureteral orifice was in the normal anatomic position.  Left retrograde pyelography was performed with a 5 French opening catheter and contrast.  Left retrograde pyelogram demonstrated a normal ureter and intrarenal collecting system.  The cystoscope was then replaced with a 26 French continuous-flow resectoscope sheath.  This is fitted with a bipolar loop and an [place] handle using the 30  degree lens.  Saline was used as the irrigant.  Resection of the lesion was performed and it appeared to invade the bladder neck however the lesion was quite extensive and greater than 5 cm in diameter with extent into the bladder particularly anteriorly approximately 4 cm.  I was unable to perform a complete resection due to the bulk of the tumor.  Once I resected what I felt was possible bladder was free of chips evacuated and hemostasis was achieved.  A 22 French three-way Foley catheter was inserted and the balloon was filled with 30 mL of sterile water.  The catheter was irrigated with clear  return and was placed on continuous irrigation with normal saline.  The catheter was connected to a drainage bag.  She was taken down from the lithotomy position.  Her anesthetic was reversed.  She was taken to the recovery room in stable condition.  There were no complications.

## 2017-03-01 NOTE — Transfer of Care (Signed)
Immediate Anesthesia Transfer of Care Note  Patient: Marja Adderley  Procedure(s) Performed: TRANSURETHRAL RESECTION OF LARGE BLADDER TUMOR (TURBT)FROM BLADDER NECK (N/A Bladder) CYSTOSCOPY WITH RETROGRADE PYELOGRAM (Left Bladder)  Patient Location: PACU  Anesthesia Type:General  Level of Consciousness: awake, sedated and responds to stimulation  Airway & Oxygen Therapy: Patient Spontanous Breathing and Patient connected to face mask oxygen  Post-op Assessment: Report given to RN and Post -op Vital signs reviewed and stable  Post vital signs: Reviewed and stable  Last Vitals:  Vitals:   02/28/17 2049 03/01/17 0433  BP: (!) 137/47 (!) 147/53  Pulse: (!) 57 66  Resp: 19 20  Temp: 36.9 C 36.9 C  SpO2: 92% 90%    Last Pain:  Vitals:   03/01/17 0929  TempSrc:   PainSc: 0-No pain      Patients Stated Pain Goal: 4 (04/04/81 5003)  Complications: No apparent anesthesia complications

## 2017-03-01 NOTE — Anesthesia Postprocedure Evaluation (Signed)
Anesthesia Post Note  Patient: Brandi Arellano  Procedure(s) Performed: TRANSURETHRAL RESECTION OF LARGE BLADDER TUMOR (TURBT)FROM BLADDER NECK (N/A Bladder) CYSTOSCOPY WITH RETROGRADE PYELOGRAM (Left Bladder)     Patient location during evaluation: PACU Anesthesia Type: General Level of consciousness: awake and alert Pain management: pain level controlled Vital Signs Assessment: post-procedure vital signs reviewed and stable Respiratory status: spontaneous breathing, nonlabored ventilation, respiratory function stable and patient connected to nasal cannula oxygen Cardiovascular status: blood pressure returned to baseline and stable Postop Assessment: no apparent nausea or vomiting Anesthetic complications: no    Last Vitals:  Vitals:   03/01/17 1230 03/01/17 1245  BP: 125/70 (!) 147/65  Pulse: 61 63  Resp: 13 16  Temp: 36.7 C 37 C  SpO2: 92% 93%    Last Pain:  Vitals:   03/01/17 1230  TempSrc:   PainSc: [place] DAVID

## 2017-03-01 NOTE — Progress Notes (Signed)
CRITICAL VALUE ALERT  Critical Value: Potassium 6.7   Date & Time Notied:  03/01/17 1900  Provider Notified: Dr. Justin Mend  Orders Received/Actions taken: New orders placed

## 2017-03-01 NOTE — Anesthesia Preprocedure Evaluation (Addendum)
Anesthesia Evaluation  Patient identified by MRN, date of birth, ID band Patient awake    Reviewed: Allergy & Precautions, NPO status , Patient's Chart, lab work & pertinent test results  History of Anesthesia Complications (+) PONV  Airway Mallampati: I  TM Distance: >3 FB Neck ROM: Full    Dental   Pulmonary former smoker,    Pulmonary exam normal        Cardiovascular hypertension, Pt. on medications + CAD and + CABG  Normal cardiovascular exam     Neuro/Psych CVA    GI/Hepatic GERD  Medicated and Controlled,  Endo/Other  diabetes, Type 2, Oral Hypoglycemic Agents  Renal/GU ESRF and DialysisRenal disease     Musculoskeletal   Abdominal   Peds  Hematology   Anesthesia Other Findings   Reproductive/Obstetrics                            Anesthesia Physical Anesthesia Plan  ASA: III  Anesthesia Plan: General   Post-op Pain Management:    Induction: Intravenous  PONV Risk Score and Plan: 4 or greater and Ondansetron, Midazolam and Treatment may vary due to age or medical condition  Airway Management Planned: LMA  Additional Equipment:   Intra-op Plan:   Post-operative Plan: Extubation in OR  Informed Consent: I have reviewed the patients History and Physical, chart, labs and discussed the procedure including the risks, benefits and alternatives for the proposed anesthesia with the patient or authorized representative who has indicated his/her understanding and acceptance.     Plan Discussed with: CRNA and Surgeon  Anesthesia Plan Comments:         Anesthesia Quick Evaluation

## 2017-03-01 NOTE — Progress Notes (Signed)
Patient ID: Brandi Arellano, female   DOB: 1955-10-17, 61 y.o.   MRN: [id number] BP (!) 147/65   Pulse 63   Temp 98.6 F (37 C)   Resp 16   Ht 5\' 6"  (1.676 m)   Wt 55.3 kg (121 lb 14.6 oz)   SpO2 93%   BMI 19.68 kg/m   Brandi Arellano is doing well post op.   She is sore.  The urine is light pink on slow CBI.  Hgb is 9.0 which is a minimal decline.     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  I was unable to find the right UO because of squamous changes in the trigone extending on the right lateral wall.      I will have her set up for an oncology consultation and briefly discussed her case with Dr. Tresa Moore as she may need cystectomy and bilateral nephrectomy if she is felt to be a surgical candidate.

## 2017-03-02 ENCOUNTER — Encounter (HOSPITAL_COMMUNITY): Payer: Self-pay | Admitting: Urology

## 2017-03-02 LAB — CBC
HCT: 25.9 % — ABNORMAL LOW (ref 36.0–46.0)
HCT: 30.1 % — ABNORMAL LOW (ref 36.0–46.0)
Hemoglobin: 8.3 g/dL — ABNORMAL LOW (ref 12.0–15.0)
Hemoglobin: 9.9 g/dL — ABNORMAL LOW (ref 12.0–15.0)
MCH: 30.6 pg (ref 26.0–34.0)
MCH: 30.7 pg (ref 26.0–34.0)
MCHC: 32 g/dL (ref 30.0–36.0)
MCHC: 32.9 g/dL (ref 30.0–36.0)
MCV: 95.6 fL (ref 78.0–100.0)
MCV: 93.5 fL (ref 78.0–100.0)
Platelets: 154 10*3/uL (ref 150–400)
Platelets: 179 10*3/uL (ref 150–400)
RBC: 2.71 MIL/uL — ABNORMAL LOW (ref 3.87–5.11)
RBC: 3.22 MIL/uL — ABNORMAL LOW (ref 3.87–5.11)
RDW: 19.7 % — ABNORMAL HIGH (ref 11.5–15.5)
RDW: 19.4 % — ABNORMAL HIGH (ref 11.5–15.5)
WBC: 4.6 10*3/uL (ref 4.0–10.5)
WBC: 4.7 K/uL (ref 4.0–10.5)

## 2017-03-02 LAB — GLUCOSE, CAPILLARY
GLUCOSE-CAPILLARY: 192 mg/dL — AB (ref 65–99)
GLUCOSE-CAPILLARY: 199 mg/dL — AB (ref 65–99)
Glucose-Capillary: 101 mg/dL — ABNORMAL HIGH (ref 65–99)
Glucose-Capillary: 107 mg/dL — ABNORMAL HIGH (ref 65–99)

## 2017-03-02 LAB — RENAL FUNCTION PANEL
Albumin: 2.7 g/dL — ABNORMAL LOW (ref 3.5–5.0)
Albumin: 3.2 g/dL — ABNORMAL LOW (ref 3.5–5.0)
Anion gap: 12 (ref 5–15)
Anion gap: 12 (ref 5–15)
BUN: 85 mg/dL — ABNORMAL HIGH (ref 6–20)
BUN: 19 mg/dL (ref 6–20)
CO2: 23 mmol/L (ref 22–32)
CO2: 25 mmol/L (ref 22–32)
Calcium: 8.3 mg/dL — ABNORMAL LOW (ref 8.9–10.3)
Calcium: 8.8 mg/dL — ABNORMAL LOW (ref 8.9–10.3)
Chloride: 98 mmol/L — ABNORMAL LOW (ref 101–111)
Chloride: 97 mmol/L — ABNORMAL LOW (ref 101–111)
Creatinine, Ser: 8.48 mg/dL — ABNORMAL HIGH (ref 0.44–1.00)
Creatinine, Ser: 3.13 mg/dL — ABNORMAL HIGH (ref 0.44–1.00)
GFR calc Af Amer: 5 mL/min — ABNORMAL LOW (ref >60)
GFR calc Af Amer: 18 mL/min — ABNORMAL LOW (ref >60)
GFR calc non Af Amer: 5 mL/min — ABNORMAL LOW (ref >60)
GFR calc non Af Amer: 15 mL/min — ABNORMAL LOW (ref >60)
Glucose, Bld: 252 mg/dL — ABNORMAL HIGH (ref 65–99)
Glucose, Bld: 118 mg/dL — ABNORMAL HIGH (ref 65–99)
Phosphorus: 6.2 mg/dL — ABNORMAL HIGH (ref 2.5–4.6)
Phosphorus: 2.6 mg/dL (ref 2.5–4.6)
Potassium: 5.7 mmol/L — ABNORMAL HIGH (ref 3.5–5.1)
Potassium: 3.7 mmol/L (ref 3.5–5.1)
Sodium: 133 mmol/L — ABNORMAL LOW (ref 135–145)
Sodium: 134 mmol/L — ABNORMAL LOW (ref 135–145)

## 2017-03-02 LAB — POTASSIUM: POTASSIUM: 6.7 mmol/L — AB (ref 3.5–5.1)

## 2017-03-02 MED ORDER — PENTAFLUOROPROP-TETRAFLUOROETH EX AERO: 1.0000 "application " | INHALATION_SPRAY | CUTANEOUS | Status: DC | PRN

## 2017-03-02 MED ORDER — LIDOCAINE-PRILOCAINE 2.5-2.5 % EX CREA: 1.0000 "application " | TOPICAL_CREAM | CUTANEOUS | Status: DC | PRN

## 2017-03-02 MED ORDER — ALTEPLASE 2 MG IJ SOLR: 2.0000 mg | Freq: Once | INTRAMUSCULAR | Status: DC | PRN

## 2017-03-02 MED ORDER — LIDOCAINE HCL (PF) 1 % IJ SOLN: 5.0000 mL | INTRAMUSCULAR | Status: DC | PRN

## 2017-03-02 MED ORDER — HEPARIN SODIUM (PORCINE) 1000 UNIT/ML DIALYSIS
4000.0000 [IU] | Freq: Once | INTRAMUSCULAR | Status: DC
Start: 2017-03-02 — End: 2017-03-07

## 2017-03-02 MED ORDER — HEPARIN SODIUM (PORCINE) 1000 UNIT/ML DIALYSIS: 4000.0000 [IU] | Freq: Once | INTRAMUSCULAR | Status: DC

## 2017-03-02 MED ORDER — SODIUM CHLORIDE 0.9 % IV SOLN: 100.0000 mL | INTRAVENOUS | Status: DC | PRN

## 2017-03-02 MED ORDER — DOXERCALCIFEROL 4 MCG/2ML IV SOLN
INTRAVENOUS | Status: AC
  Filled 2017-03-02: qty 2

## 2017-03-02 MED ORDER — HEPARIN SODIUM (PORCINE) 1000 UNIT/ML DIALYSIS: 1000.0000 [IU] | INTRAMUSCULAR | Status: DC | PRN

## 2017-03-02 NOTE — Progress Notes (Signed)
[facility] Progress Note   Subjective: Transferred to [facility] for TURBT per Dr. 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 Has recommended bilateral nephrectomy and cystectomy. Oncology has been consulted. She is back at [facility], is cheerful, unsure what course with therapy she needs to take. Will need further discussions with Dr. Jeffie Pollock. Had elevated K+ 6.7 11/20. Rec'd kayexelate-came down to 5.4.   Objective Vitals:   03/01/17 2138 03/02/17 0211 03/02/17 0537 03/02/17 1255  BP: (!) 108/55 138/74 (!) 149/82 (!) 145/84  Pulse: (!) 57 (!) 57 (!) 58 (!) 57  Resp: 18 18 18 16   Temp: 97.9 F (36.6 C) 97.8 F (36.6 C) 97.9 F (36.6 C)   TempSrc: Oral Oral Oral   SpO2: 94% 96% 96%   Weight:      Height:       Physical Exam General: Awake, Alert, NAD Heart: Q7,Y1 3/6 systolic M. Lungs: CTAB A/P Abdomen: tenderness to palpitation lower quads of abd. Foley patent-orange colored urine Extremities: Trace BLE edema Dialysis Access: RIJ TDC blood lines connected.   Dialysis Orders:  Additional Objective Labs: Basic Metabolic Panel: Recent Labs  Lab 02/25/17 0625 02/27/17 1154 03/01/17 0916 03/01/17 1810 03/01/17 2256 03/02/17 1353  NA 134* 135 135  --   --  133*  K 5.2* 5.3* 5.2* 6.7* 5.4* 5.7*  CL 100* 98*  --   --   --  98*  CO2 25 25  --   --   --  23  GLUCOSE 198* 142* 148*  --   --  252*  BUN 67* 88*  --   --   --  85*  CREATININE 6.71* 6.94*  --   --   --  8.48*  CALCIUM 9.0 9.5  --   --   --  8.3*  PHOS 4.8* 4.8*  --   --   --  6.2*   Liver Function Tests: Recent Labs  Lab 02/23/17 1820 02/25/17 0625 02/27/17 1154  ALBUMIN 2.9* 2.8* 3.0*   No results for input(s): LIPASE, AMYLASE in the last 168 hours. CBC: Recent Labs  Lab 02/23/17 1820 02/25/17 0625 02/27/17 1155 03/01/17 0916 03/01/17 1203  WBC 6.3 6.4 6.6  --   --   HGB 8.8* 8.7* 9.1* 9.9* 9.0*  HCT 28.8* 27.9* 29.3* 29.0* 28.1*  MCV 95.7 96.2 95.1  --   --    PLT 148* 122* 142*  --   --    Blood Culture    Component Value Date/Time   SDES URINE, CATHETERIZED 02/24/2017 1429   SPECREQUEST NONE 02/24/2017 1429   CULT NO GROWTH 02/24/2017 1429   REPTSTATUS 02/25/2017 FINAL 02/24/2017 1429    Cardiac Enzymes: No results for input(s): CKTOTAL, CKMB, CKMBINDEX, TROPONINI in the last 168 hours. CBG: Recent Labs  Lab 03/01/17 1131 03/01/17 1644 03/01/17 2137 03/02/17 0800 03/02/17 1220  GLUCAP 118* 135* 203* 101* 192*   Iron Studies: No results for input(s): IRON, TIBC, TRANSFERRIN, FERRITIN in the last 72 hours. @lablastinr3 @ Studies/Results: Dg C-arm 1-60 Min-no Report  Result Date: 03/01/2017 Fluoroscopy was utilized by the requesting physician.  No radiographic interpretation.   Medications:  . amLODipine  5 mg Oral Daily  . aspirin EC  81 mg Oral BID  . atorvastatin  40 mg Oral QPM  . chlorhexidine  15 mL Mouth Rinse BID  . [START ON 03/04/2017] darbepoetin (ARANESP) injection - DIALYSIS  100 mcg Intravenous Q Fri-HD  . docusate sodium  100 mg  Oral Daily  . doxercalciferol  3 mcg Intravenous Q M,W,F-HD  . folic acid  1 mg Oral Daily  . gabapentin  100 mg Oral BID  . heparin  5,000 Units Subcutaneous Q8H  . insulin aspart  0-9 Units Subcutaneous TID AC & HS  . levothyroxine  25 mcg Oral QAC breakfast  . mouth rinse  15 mL Mouth Rinse q12n4p  . metoprolol tartrate  25 mg Oral BID  . multivitamin with minerals  1 tablet Oral Daily  . sodium chloride flush  3 mL Intravenous Q12H  . thiamine  100 mg Oral Daily   [facility] 4h 56.5kg 2/2.25 400/auto 1.5  Linear sodium UFP 4 -Hep 4000 R IJ TDC (new one placed 10/29) -hect 4 ug tiw -BMM: Fosrenol 1000mg  2 tabs tid  Assessment/Plan: 1. 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 Jeffie Pollock recommending bilateral nephrectomy and cystectomy.  2. ESRD -MWF HD today on schedule then next Friday. Had a  gap b/c she went to [facility]-  3. Anemia - HGB 9.0. Has been on aranesp but now with dx squamous cell ca. Will hold ESA until discussed with oncology. Follow HGB.  4. Secondary hyperparathyroidism - Labs pending. Last Phos 4.8 Ca 9.5. Continue VDRA, binders on hold.  5. HTN/volume - BP controlled UFG set for 3.3 liters. No pre wt. EDW tentantively set at 55 kg. Continue amlodipine/metoprolol 6. Nutrition - Albumin 3.0 Renal/Carb mod diet, nepro,renal vit 7. H/O AMS on admission: Thought to be due to pain medications. Resolved 8. H/O possible suicidal ideation. Seen by psych 10/30. No active SI. 9. DM: per primary 10. Hyperkalemia: K+ 6.7 03/01/17 rec'd Kayexalate K+ down to 5.4. HD today. Labs pending- on 2 K bath- 5.7 pre .   Rita H. Brown NP-C 03/02/2017, 1:48 PM  [facility] [phone]  Patient seen and examined, agree with above note with above modifications. Has been recommended to pt nephrect and cystectomy- she is not sure what to do.  HD was delayed some with trip to [facility]- now being transferred back- HD today and Friday on schedule- not sure she understands the severity of the situation  Corliss Parish, MD 03/02/2017       \

## 2017-03-02 NOTE — Progress Notes (Signed)
1 Day Post-Op  Subjective: CC: Bladder cancer s/p resection,.  Hx:   Brandi Arellano is POD 1 from a TURBT of and extensive bladder neck tumor which was most consistent with an invasive squamous cell carcinoma.   She is doing well this morning with clear urine on slow CBI.  She has some suprapubic soreness but is o/w without complaints.    ROS:  Review of Systems  All other systems reviewed and are negative.   Anti-infectives: Anti-infectives (From admission, onward)   Start     Dose/Rate Route Frequency Ordered Stop   03/01/17 0916  ceFAZolin (ANCEF) 2-4 GM/100ML-% IVPB    Comments:  Williford, Peggy   : cabinet override      03/01/17 0916 03/01/17 0939   03/01/17 0845  gentamicin (GARAMYCIN) IVPB 80 mg     80 mg 100 mL/hr over 30 Minutes Intravenous 30 min pre-op 02/28/17 1409 03/01/17 1025   03/01/17 0815  ceFAZolin (ANCEF) IVPB 2g/100 mL premix     2 g 200 mL/hr over 30 Minutes Intravenous 30 min pre-op 02/28/17 1726 03/01/17 0939   02/07/17 2000  ceFEPIme (MAXIPIME) 2 g in dextrose 5 % 50 mL IVPB  Status:  Discontinued     2 g 100 mL/hr over 30 Minutes Intravenous Every M-W-F (2000) 02/07/17 0144 02/08/17 1141   02/07/17 0145  ceFEPIme (MAXIPIME) 2 g in dextrose 5 % 50 mL IVPB     2 g 100 mL/hr over 30 Minutes Intravenous  Once 02/07/17 0137 02/07/17 4627      Current Facility-Administered Medications  Medication Dose Route Frequency Provider Last Rate Last Dose  . acetaminophen (TYLENOL) tablet 650 mg  650 mg Oral Q6H PRN Sela Hilding, MD   650 mg at 03/01/17 1737   Or  . acetaminophen (TYLENOL) suppository 650 mg  650 mg Rectal Q6H PRN Sela Hilding, MD      . amLODipine (NORVASC) tablet 5 mg  5 mg Oral Daily Sela Hilding, MD   5 mg at 02/28/17 1037  . aspirin EC tablet 81 mg  81 mg Oral BID Sela Hilding, MD   81 mg at 03/01/17 2141  . atorvastatin (LIPITOR) tablet 40 mg  40 mg Oral QPM Sela Hilding, MD   40 mg at 03/01/17 1737  . chlorhexidine  (PERIDEX) 0.12 % solution 15 mL  15 mL Mouth Rinse BID Leeanne Rio, MD   15 mL at 02/28/17 2310  . [START ON 03/04/2017] Darbepoetin Alfa (ARANESP) injection 100 mcg  100 mcg Intravenous Q Fri-HD Roney Jaffe, MD      . docusate sodium (COLACE) capsule 100 mg  100 mg Oral Daily Guadalupe Dawn, MD   100 mg at 02/28/17 1038  . doxercalciferol (HECTOROL) injection 3 mcg  3 mcg Intravenous Q M,W,F-HD Valentina Gu, NP   3 mcg at 02/25/17 0849  . folic acid (FOLVITE) tablet 1 mg  1 mg Oral Daily Sela Hilding, MD   1 mg at 02/28/17 1037  . gabapentin (NEURONTIN) capsule 100 mg  100 mg Oral BID Wendee Beavers T, MD   100 mg at 02/26/17 1058  . heparin injection 5,000 Units  5,000 Units Subcutaneous Q8H End, Christopher, MD   5,000 Units at 03/02/17 0701  . insulin aspart (novoLOG) injection 0-9 Units  0-9 Units Subcutaneous TID AC & HS Eloise Levels, MD   1 Units at 03/01/17 1738  . levothyroxine (SYNTHROID, LEVOTHROID) tablet 25 mcg  25 mcg Oral QAC breakfast Sela Hilding, MD  25 mcg at 02/28/17 1037  . MEDLINE mouth rinse  15 mL Mouth Rinse q12n4p Leeanne Rio, MD   15 mL at 03/01/17 1737  . metoprolol tartrate (LOPRESSOR) tablet 25 mg  25 mg Oral BID Sueanne Margarita, MD   25 mg at 03/01/17 2139  . multivitamin with minerals tablet 1 tablet  1 tablet Oral Daily Sela Hilding, MD   1 tablet at 02/28/17 1037  . opium-belladonna (B&O SUPPRETTES) 16.2-60 MG suppository 1 suppository  1 suppository Rectal Q6H PRN Jimmey Ralph, NP      . senna-docusate (Senokot-S) tablet 1 tablet  1 tablet Oral QHS PRN Rory Percy, DO      . sodium chloride flush (NS) 0.9 % injection 3 mL  3 mL Intravenous Q12H Sela Hilding, MD   3 mL at 03/01/17 2142  . thiamine (VITAMIN B-1) tablet 100 mg  100 mg Oral Daily Sela Hilding, MD   100 mg at 02/28/17 1037   Facility-Administered Medications Ordered in Other Encounters  Medication Dose Route Frequency Provider  Last Rate Last Dose  . 0.9 %  sodium chloride infusion    Continuous PRN Laretta Alstrom, CRNA         Objective: Vital signs in last 24 hours: Temp:  [97.8 F (36.6 C)-98.7 F (37.1 C)] 97.9 F (36.6 C) (11/21 0537) Pulse Rate:  [57-68] 58 (11/21 0537) Resp:  [10-18] 18 (11/21 0537) BP: (108-176)/(55-82) 149/82 (11/21 0537) SpO2:  [91 %-96 %] 96 % (11/21 0537)  Intake/Output from previous day: 11/20 0701 - 11/21 0700 In: 6371 [P.O.:360; I.V.:911; IV Piggyback:150] Out: 4100 [Urine:3850; Blood:250] Intake/Output this shift: No intake/output data recorded.   Physical Exam  Constitutional: She is well-developed, well-nourished, and in no distress.  Cardiovascular: Normal rate and regular rhythm.  Pulmonary/Chest: Effort normal. No respiratory distress.  Vitals reviewed.   Lab Results:  Recent Labs    02/27/17 1155 03/01/17 0916 03/01/17 1203  WBC 6.6  --   --   HGB 9.1* 9.9* 9.0*  HCT 29.3* 29.0* 28.1*  PLT 142*  --   --    BMET Recent Labs    02/27/17 1154 03/01/17 0916 03/01/17 1810 03/01/17 2256  NA 135 135  --   --   K 5.3* 5.2* 6.7* 5.4*  CL 98*  --   --   --   CO2 25  --   --   --   GLUCOSE 142* 148*  --   --   BUN 88*  --   --   --   CREATININE 6.94*  --   --   --   CALCIUM 9.5  --   --   --    PT/INR No results for input(s): LABPROT, INR in the last 72 hours. ABG No results for input(s): PHART, HCO3 in the last 72 hours.  Invalid input(s): PCO2, PO2  Studies/Results: Dg C-arm 1-60 Min-no Report  Result Date: 03/01/2017 Fluoroscopy was utilized by the requesting physician.  No radiographic interpretation.   Labs reviewed.  Case Discussed the Dr. Grandville Silos.  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   I will leave the foley but D/C CBI.    She will need further therapy with cystectomy if medically fit and with her ESRD, bilateral nephrectomy to remove the need for a  diversion.   I will have her see Dr. Tresa Moore to consider that.   I have also messaged Dr. Alen Blew for  his input.   ESRD.   I have spoken with Dr. Grandville Silos with [facility] and they will arrange transfer back to [facility] to resume dialysis.       LOS: 23 days    Irine Seal 03/02/2017 [phone] ID: [name], female   DOB: Oct 01, 1955, 61 y.o.   MRN: [id number]

## 2017-03-02 NOTE — Procedures (Signed)
Patient was seen on dialysis and the procedure was supervised.  BFR 400  Via PC BP is  179/86.   Patient appears to be tolerating treatment well  Brandi Arellano A 03/02/2017

## 2017-03-03 DIAGNOSIS — D494 Neoplasm of unspecified behavior of bladder: Secondary | ICD-10-CM

## 2017-03-03 LAB — CBC
HEMATOCRIT: 27.3 % — AB (ref 36.0–46.0)
HEMOGLOBIN: 8.5 g/dL — AB (ref 12.0–15.0)
MCH: 29.8 pg (ref 26.0–34.0)
MCHC: 31.1 g/dL (ref 30.0–36.0)
MCV: 95.8 fL (ref 78.0–100.0)
Platelets: 154 10*3/uL (ref 150–400)
RBC: 2.85 MIL/uL — ABNORMAL LOW (ref 3.87–5.11)
RDW: 19.4 % — ABNORMAL HIGH (ref 11.5–15.5)
WBC: 4.8 10*3/uL (ref 4.0–10.5)

## 2017-03-03 LAB — RENAL FUNCTION PANEL
ALBUMIN: 2.9 g/dL — AB (ref 3.5–5.0)
ANION GAP: 12 (ref 5–15)
BUN: 37 mg/dL — ABNORMAL HIGH (ref 6–20)
CO2: 23 mmol/L (ref 22–32)
Calcium: 8.4 mg/dL — ABNORMAL LOW (ref 8.9–10.3)
Chloride: 99 mmol/L — ABNORMAL LOW (ref 101–111)
Creatinine, Ser: 4.74 mg/dL — ABNORMAL HIGH (ref 0.44–1.00)
GFR calc Af Amer: 11 mL/min — ABNORMAL LOW (ref >60)
GFR calc non Af Amer: 9 mL/min — ABNORMAL LOW (ref >60)
GLUCOSE: 161 mg/dL — AB (ref 65–99)
PHOSPHORUS: 5 mg/dL — AB (ref 2.5–4.6)
POTASSIUM: 4 mmol/L (ref 3.5–5.1)
Sodium: 134 mmol/L — ABNORMAL LOW (ref 135–145)

## 2017-03-03 LAB — GLUCOSE, CAPILLARY
GLUCOSE-CAPILLARY: 168 mg/dL — AB (ref 65–99)
GLUCOSE-CAPILLARY: 162 mg/dL — AB (ref 65–99)
Glucose-Capillary: 217 mg/dL — ABNORMAL HIGH (ref 65–99)
Glucose-Capillary: 246 mg/dL — ABNORMAL HIGH (ref 65–99)

## 2017-03-03 MED ORDER — PRO-STAT SUGAR FREE PO LIQD
30.0000 mL | Freq: Two times a day (BID) | ORAL | Status: DC
  Administered 2017-03-03 – 2017-03-09 (×2): 30 mL via ORAL
  Filled 2017-03-03 (×9): qty 30

## 2017-03-03 MED ORDER — NEPRO/CARBSTEADY PO LIQD
237.0000 mL | Freq: Two times a day (BID) | ORAL | Status: DC
  Filled 2017-03-03 (×18): qty 237

## 2017-03-03 NOTE — Progress Notes (Signed)
2 Days Post-Op Subjective: Patient reports no complaints.   Objective: Vital signs in last 24 hours: Temp:  [97.5 F (36.4 C)-98.6 F (37 C)] 98.6 F (37 C) (11/22 1733) Pulse Rate:  [60-71] 64 (11/22 1733) Resp:  [16-18] 16 (11/22 1733) BP: (97-146)/(41-65) 146/60 (11/22 1733) SpO2:  [93 %-100 %] 100 % (11/22 1733)  Intake/Output from previous day: 11/21 0701 - 11/22 0700 In: 390 [P.O.:240] Out: 4400 [Urine:1600] Intake/Output this shift: Total I/O In: 120 [P.O.:120] Out: 0   Physical Exam:  NAD Alert and O x 3 Sitting in chair Abd - soft, NT GU - urine clear   Lab Results: Recent Labs    03/02/17 1353 03/02/17 1908 03/03/17 0917  HGB 8.3* 9.9* 8.5*  HCT 25.9* 30.1* 27.3*   BMET Recent Labs    03/02/17 1907 03/03/17 0917  NA 134* 134*  K 3.7 4.0  CL 97* 99*  CO2 25 23  GLUCOSE 118* 161*  BUN 19 37*  CREATININE 3.13* 4.74*  CALCIUM 8.8* 8.4*   No results for input(s): LABPT, INR in the last 72 hours. No results for input(s): LABURIN in the last 72 hours. Results for orders placed or performed during the hospital encounter of 02/06/17  Blood culture (routine x 2)     Status: None   Collection Time: 02/06/17  8:02 PM  Result Value Ref Range Status   Specimen Description BLOOD RIGHT ARM  Final   Special Requests   Final    BOTTLES DRAWN AEROBIC AND ANAEROBIC Blood Culture adequate volume   Culture NO GROWTH 5 DAYS  Final   Report Status 02/12/2017 FINAL  Final  Blood culture (routine x 2)     Status: None   Collection Time: 02/06/17 10:00 PM  Result Value Ref Range Status   Specimen Description BLOOD RIGHT ARM  Final   Special Requests   Final    BOTTLES DRAWN AEROBIC ONLY Blood Culture adequate volume   Culture NO GROWTH 5 DAYS  Final   Report Status 02/12/2017 FINAL  Final  Culture, Urine     Status: None   Collection Time: 02/07/17  1:38 AM  Result Value Ref Range Status   Specimen Description URINE, RANDOM  Final   Special Requests NONE   Final   Culture NO GROWTH  Final   Report Status 02/08/2017 FINAL  Final  MRSA PCR Screening     Status: None   Collection Time: 02/07/17  3:35 AM  Result Value Ref Range Status   MRSA by PCR NEGATIVE NEGATIVE Final    Comment:        The GeneXpert MRSA Assay (FDA approved for NASAL specimens only), is one component of a comprehensive MRSA colonization surveillance program. It is not intended to diagnose MRSA infection nor to guide or monitor treatment for MRSA infections.   Culture, Urine     Status: None   Collection Time: 02/18/17  2:48 PM  Result Value Ref Range Status   Specimen Description URINE, CATHETERIZED  Final   Special Requests NONE  Final   Culture NO GROWTH  Final   Report Status 02/20/2017 FINAL  Final  Culture, Urine     Status: None   Collection Time: 02/24/17  2:29 PM  Result Value Ref Range Status   Specimen Description URINE, CATHETERIZED  Final   Special Requests NONE  Final   Culture NO GROWTH  Final   Report Status 02/25/2017 FINAL  Final    Studies/Results: No results found.  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 If invasive, she will need further therapy with cystectomy if medically fit and with her ESRD, bilateral nephrectomy to remove the need for a diversion. Dr. Jeffie Pollock will have her see Dr. Tresa Moore to consider that and messaged Dr. Alen Blew for his input. Urine clear today.      LOS: 24 days   [name] 03/03/2017, 6:11 PM

## 2017-03-03 NOTE — Progress Notes (Signed)
[facility] Progress Note   Subjective: Seen in room. Sleepy today. Denies CP or dyspnea. Only mild suprapubic pain. Still thinking about options for treatment invasive bladder cancer, tells me that she is thinking that she may NOT want to have B nephrectomy/cystectomy procedure, still considering options.  HD yest- removed 2800 BP improved   Objective Vitals:   03/02/17 1730 03/02/17 1739 03/02/17 2052 03/03/17 0516  BP: (!) 202/93 (!) 198/87 117/65 (!) 97/41  Pulse: 61 63 71 60  Resp:  18 18 18   Temp:  98 F (36.7 C) 98.2 F (36.8 C) 98 F (36.7 C)  TempSrc:  Oral Oral Oral  SpO2:  99% 93% 95%  Weight:  54.5 kg (120 lb 2.4 oz)    Height:       Physical Exam General: Frail female, NAD. 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   Liver Function Tests: Recent Labs  Lab 02/27/17 1154 03/02/17 1353 03/02/17 1907  ALBUMIN 3.0* 2.7* 3.2*   CBC: Recent Labs  Lab 02/25/17 0625 02/27/17 1155  03/02/17 1353 03/02/17 1908 03/03/17 0917  WBC 6.4 6.6  --  4.6 4.7 4.8  HGB 8.7* 9.1*   < > 8.3* 9.9* 8.5*  HCT 27.9* 29.3*   < > 25.9* 30.1* 27.3*  MCV 96.2 95.1  --  95.6 93.5 95.8  PLT 122* 142*  --  154 179 154   < > = values in this interval not displayed.   CBG: Recent Labs  Lab 03/02/17 0800 03/02/17 1220 03/02/17 1831  03/02/17 2050 03/03/17 0813  GLUCAP 101* 192* 107* 199* 168*   Medications: . sodium chloride    . sodium chloride     . amLODipine  5 mg Oral Daily  . aspirin EC  81 mg Oral BID  . atorvastatin  40 mg Oral QPM  . chlorhexidine  15 mL Mouth Rinse BID  . docusate sodium  100 mg Oral Daily  . doxercalciferol  3 mcg Intravenous Q M,W,F-HD  . folic acid  1 mg Oral Daily  . gabapentin  100 mg Oral BID  . heparin  4,000 Units Dialysis Once in dialysis  . [START ON 03/04/2017] heparin  4,000 Units Dialysis Once in dialysis  . heparin  5,000 Units Subcutaneous Q8H  . insulin aspart  0-9 Units Subcutaneous TID AC & HS  . levothyroxine  25 mcg Oral QAC breakfast  . mouth rinse  15 mL Mouth Rinse q12n4p  . metoprolol tartrate  25  mg Oral BID  . multivitamin with minerals  1 tablet Oral Daily  . sodium chloride flush  3 mL Intravenous Q12H  . thiamine  100 mg Oral Daily    Dialysis Orders: [facility] M[facility] 4h 56.5kg 2/2.25 400/auto 1.5  Linear sodium UFP 4 -Hep 4000 R IJ TDC (new one placed 10/29) -hect 4 ug tiw -BMM: Fosrenol 1000mg  2 tabs tid  Assessment/Plan: 1. 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 Oncology consulted, Dr. Jeffie Pollock recommending bilateral nephrectomy and cystectomy.  2. ESRD: Transition back to usual MWF schedule now, next 11/23. 3. Anemia: Hgb down to 8.5. ESA on hold after Dx malignancy. Will need discussion with oncology and patient. 4. Secondary hyperparathyroidism: Caorr Ca/Phos ok. Continue VDRA, binders on hold for now.  5. HTN/volume: BP variable, no edema on exam. EDW tentantively set at 55 kg. Continue amlodipine/metoprolol. 6. Nutrition: Alb 3.2, starting pro-stat and nepro. 7. Hx AMS on admission: Thought to be due to pain medications. Resolved 8. Hx possible suicidal ideation. Seen by psych 10/30. No active SI. 9. DM: per primary 10. Hyperkalemia: K+ 6.7 03/01/17 rec'd Kayexalate K+ down to 5.4,  resolved s/p HD.   Veneta Penton, PA-C 03/03/2017, 9:41 AM  [facility] Pager: [phone]  Patient seen and examined, agree with above note with above modifications. No new complaints- for HD on regular schedule tomorrow via PC- hgb low- ESA on hold due to malignancy will provide supportive care- transfuse when needed  Corliss Parish, MD 03/03/2017

## 2017-03-03 NOTE — Progress Notes (Signed)
Family Medicine Teaching Service Daily Progress Note Intern Pager: [phone]  Patient name: Brandi Arellano Medical record number: [id number] Date of birth: 1955/07/18 Age: 61 y.o. Gender: female  Primary Care Provider: Kerin Perna, NP Consultants: nephrology, vascular surgery, urology, psychiatry, cardiology Code Status: partial code (DNI, thinking about DNR)  [name]:  10/28 admitted to [facility]  10/29 seen by [facility], permcath replaced, dialysis received, seen by urology 10/30 evaluated by psych 10/31 dialysis 11/01 myoview with ischemic findings 11/02 HD, cardiac catheterization 11/05 HD 11/07 HD 11/09 HD  11/12 HD  11/14 HD 11/16 HD 11/18 HD 11/20 - Cystoscopy/TURBT @ [facility] 11/21 - HD  Assessment and Plan:  ESRD - dialysis per nephrology; next scheduled for 11/23  - renal function panel   Bladder Mass Found to have 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   - urology recommendations --> continue foley; oncology consult; may need cystectomy and bilateral nephrectomy if considered a surgical candidate - follow-up CBC; hgb had improved to 9.9 11/21 s/p procedure; down to 8.5 today, 11/22 (near BL) - Continue to trend hgb   Type II diabetes CGBs below 200 yesterday, 11/21 - sSSI - CBG monitoring with meals, qhs  FEN/GI: renal/carb modified, miralax PPx: heparin  Dispo: Awaiting ALF placement. Last SW note mentions possible placement at [facility], which would require change in location of dialysis. Will be ready for discharge once treatment plan for bladder mass decided.   Subjective:  Patient denies complaints today. Says HD went well yesterday. She is not sure what she would like to do regarding her bladder cancer and is worried she would not do well with surgery.   Objective: Temp:  [97.6 F (36.4 C)-98.2 F (36.8 C)] 98 F (36.7 C) (11/22 0516) Pulse Rate:  [56-71] 60  (11/22 0516) Resp:  [16-18] 18 (11/22 0516) BP: (97-202)/(41-93) 97/41 (11/22 0516) SpO2:  [93 %-99 %] 95 % (11/22 0516) Weight:  [120 lb 2.4 oz (54.5 kg)-127 lb 6.8 oz (57.8 kg)] 120 lb 2.4 oz (54.5 kg) (11/21 1739)  Physical Exam: General: NAD, resting in bed CVS: RRR, loud systolic murmur Lungs: CTAB, no increased work of breathing Abdomen: Soft, nontender, nondistended MSK: no lower extremity edema, moves all limbs spontaneously  Of note, urine in catheter bag still with pink tinge.   Rogue Bussing, MD 03/03/2017, 7:53 AM PGY-3, [place] Intern pager: [phone], text pages welcome

## 2017-03-04 LAB — RENAL FUNCTION PANEL
Albumin: 2.7 g/dL — ABNORMAL LOW (ref 3.5–5.0)
Anion gap: 11 (ref 5–15)
BUN: 59 mg/dL — AB (ref 6–20)
CHLORIDE: 100 mmol/L — AB (ref 101–111)
CO2: 25 mmol/L (ref 22–32)
Calcium: 8.7 mg/dL — ABNORMAL LOW (ref 8.9–10.3)
Creatinine, Ser: 6.08 mg/dL — ABNORMAL HIGH (ref 0.44–1.00)
GFR calc Af Amer: 8 mL/min — ABNORMAL LOW (ref >60)
GFR, EST NON AFRICAN AMERICAN: 7 mL/min — AB (ref >60)
Glucose, Bld: 163 mg/dL — ABNORMAL HIGH (ref 65–99)
POTASSIUM: 4 mmol/L (ref 3.5–5.1)
Phosphorus: 6.9 mg/dL — ABNORMAL HIGH (ref 2.5–4.6)
Sodium: 136 mmol/L (ref 135–145)

## 2017-03-04 LAB — GLUCOSE, CAPILLARY
GLUCOSE-CAPILLARY: 124 mg/dL — AB (ref 65–99)
Glucose-Capillary: 109 mg/dL — ABNORMAL HIGH (ref 65–99)
Glucose-Capillary: 271 mg/dL — ABNORMAL HIGH (ref 65–99)

## 2017-03-04 LAB — CBC
HEMATOCRIT: 25.6 % — AB (ref 36.0–46.0)
Hemoglobin: 8.1 g/dL — ABNORMAL LOW (ref 12.0–15.0)
MCH: 30.1 pg (ref 26.0–34.0)
MCHC: 31.6 g/dL (ref 30.0–36.0)
MCV: 95.2 fL (ref 78.0–100.0)
Platelets: 159 10*3/uL (ref 150–400)
RBC: 2.69 MIL/uL — ABNORMAL LOW (ref 3.87–5.11)
RDW: 19.4 % — AB (ref 11.5–15.5)
WBC: 5.1 10*3/uL (ref 4.0–10.5)

## 2017-03-04 MED ORDER — LANTHANUM CARBONATE 500 MG PO CHEW
1000.0000 mg | CHEWABLE_TABLET | Freq: Three times a day (TID) | ORAL | Status: DC
  Administered 2017-03-04 – 2017-03-15 (×23): 1000 mg via ORAL
  Filled 2017-03-04 (×26): qty 2

## 2017-03-04 NOTE — Progress Notes (Signed)
Family Medicine Teaching Service Daily Progress Note Intern Pager: [phone]  Patient name: Brandi Arellano Medical record number: [id number] Date of birth: 13-Feb-1956 Age: 61 y.o. Gender: female  Primary Care Provider: Kerin Perna, NP Consultants: nephrology, vascular surgery, urology, psychiatry, cardiology Code Status: partial code (DNI, thinking about DNR)  [name]:  10/28 admitted to fpts  10/29 seen by [facility], permcath replaced, dialysis received, seen by urology 10/30 evaluated by psych 10/31 dialysis 11/01 myoview with ischemic findings 11/02 HD, cardiac catheterization 11/05 HD 11/07 HD 11/09 HD  11/12 HD  11/14 HD 11/16 HD 11/18 HD 11/20 - Cystoscopy/TURBT @ [facility] 11/21 - HD 11/23 - HD  Assessment and Plan:  ESRD - dialysis per nephrology; next scheduled for 11/23  - renal function panel   Bladder Mass Found to have 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 Patient declinced palliative medicine consult.  - urology recommendations --> continue foley; oncology consult; may need cystectomy and bilateral nephrectomy if considered a surgical candidate - follow-up CBC; hgb had improved to 9.9 11/21 s/p procedure; down to 8.5 today, 11/22 (near BL) - Continue to trend hgb   Type II diabetes CGBs below 200 yesterday, 11/23 - sSSI - CBG monitoring with meals, qhs  FEN/GI: renal/carb modified, miralax PPx: heparin  Dispo: Awaiting ALF placement. Last SW note mentions [facility] at [facility], which would require change in location of dialysis. Will be ready for discharge once treatment plan for bladder mass decided.    Subjective:  Patient denies complaints today. She is not interested in palliative medicine consult.  Objective: Temp:  [97.5 F (36.4 C)-98.6 F (37 C)] 98.6 F (37 C) (11/23 0612) Pulse Rate:  [61-66] 66 (11/23 0612) Resp:  [16-18] 18 (11/23  0612) BP: (126-156)/(57-60) 150/59 (11/23 0612) SpO2:  [93 %-100 %] 96 % (11/23 0612) Weight:  [124 lb 12.5 oz (56.6 kg)] 124 lb 12.5 oz (56.6 kg) (11/23 0612)  Physical Exam: General: NAD, at HD CVS: RRR, loud systolic murmur Lungs: CTAB, no increased work of breathing Abdomen: Soft, nontender, nondistended MSK: no lower extremity edema, moves all limbs spontaneously  Bonnita Hollow, MD 03/04/2017, 8:29 AM PGY-1, [place] Intern pager: [phone], text pages welcome

## 2017-03-04 NOTE — Progress Notes (Signed)
[facility] Progress Note   Subjective: Seen in HD- no c/o's   tells me that she is thinking that she may NOT want to have B nephrectomy/cystectomy procedure, still considering options.    Objective Vitals:   03/04/17 0709 03/04/17 0730 03/04/17 0800 03/04/17 0830  BP: (!) 162/75 138/67 (!) 121/57 (!) 116/55  Pulse: (!) 58 (!) 57 (!) 55 (!) 53  Resp:      Temp:      TempSrc:      SpO2:      Weight:      Height:       Physical Exam General: Frail female, NAD. 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) Dialysis Access: TDC in R chest  Additional Objective Labs: Basic Metabolic Panel: Recent Labs  Lab 03/02/17 1907 03/03/17 0917 03/04/17 0822  NA 134* 134* 136  K 3.7 4.0 4.0  CL 97* 99* 100*  CO2 25 23 25   GLUCOSE 118* 161* 163*  BUN 19 37* 59*  CREATININE 3.13* 4.74* 6.08*  CALCIUM 8.8* 8.4* 8.7*  PHOS 2.6 5.0* 6.9*   Liver Function Tests: Recent Labs  Lab 03/02/17 1907 03/03/17 0917 03/04/17 0822  ALBUMIN 3.2* 2.9* 2.7*   CBC: Recent Labs  Lab 02/27/17 1155  03/02/17 1353 03/02/17 1908 03/03/17 0917 03/04/17 0823  WBC 6.6  --  4.6 4.7 4.8 5.1  HGB 9.1*   < > 8.3* 9.9* 8.5* 8.1*  HCT 29.3*   < > 25.9* 30.1* 27.3* 25.6*  MCV 95.1  --  95.6 93.5 95.8 95.2  PLT 142*  --  154 179 154 159   < > = values in this interval not displayed.   CBG: Recent Labs  Lab 03/02/17 2050 03/03/17 0813 03/03/17 1231 03/03/17 1656 03/03/17 2121  GLUCAP 199* 168* 217* 246* 162*   Medications: . sodium chloride    . sodium chloride     . amLODipine  5 mg Oral Daily  . aspirin EC  81 mg Oral BID  . atorvastatin  40 mg Oral QPM  . chlorhexidine  15 mL Mouth Rinse BID  . docusate sodium  100 mg Oral Daily  . doxercalciferol  3 mcg Intravenous Q M,W,F-HD  . feeding supplement (NEPRO CARB STEADY)  237 mL Oral BID BM  . feeding supplement (PRO-STAT SUGAR FREE  64)  30 mL Oral BID  . folic acid  1 mg Oral Daily  . gabapentin  100 mg Oral BID  . heparin  4,000 Units Dialysis Once in dialysis  . heparin  4,000 Units Dialysis Once in dialysis  . heparin  5,000 Units Subcutaneous Q8H  . insulin aspart  0-9 Units Subcutaneous TID AC & HS  . levothyroxine  25 mcg Oral QAC breakfast  . mouth rinse  15 mL Mouth Rinse q12n4p  . metoprolol tartrate  25 mg Oral BID  . multivitamin with minerals  1 tablet Oral Daily  . sodium chloride flush  3 mL Intravenous Q12H  . thiamine  100 mg Oral Daily    Dialysis [facility]: [facility] 4h 56.5kg 2/2.25 400/auto 1.5  Linear sodium UFP 4 -Hep 4000 R IJ TDC (new one placed 10/29) -hect 4 ug tiw -BMM: Fosrenol 1000mg  2 tabs tid  Assessment/Plan: 1. 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 Oncology consulted, Dr. Jeffie Pollock recommending bilateral nephrectomy and cystectomy- treatment plan unclear at this point.  2. ESRD: Transition back to usual MWF schedule now, next today via [facility]. 3. Anemia: Hgb down to 8.1. ESA on hold after Dx malignancy. Will need discussion with oncology and patient. Supportive care with transfusion as needed  4. Secondary hyperparathyroidism: Caorr Ca/Phos rising. Continue VDRA, resume binders 5. HTN/volume: BP variable, no edema on exam. EDW tentantively set at 55 kg. Continue amlodipine/metoprolol. 6. Nutrition: Alb 3.2- now 2.7, starting pro-stat and nepro. 7. Hx AMS on admission: Thought to be due to pain medications. Resolved 8. Hx possible suicidal ideation. Seen by psych 10/30. No active SI. 9. DM: per primary 10. Hyperkalemia: was due to long gap between tx- resolved    Corliss Parish, MD  03/04/2017

## 2017-03-04 NOTE — Progress Notes (Signed)
[facility] stated they will review patient. They would like to be notified when patient is medically stable for discharge with bladder mass plans.   Percell Locus Hutson Luft LCSWA [phone]

## 2017-03-04 NOTE — Procedures (Signed)
Patient was seen on dialysis and the procedure was supervised.  BFR 400  Via PC BP is  196/84- suspect error as previous BPs have been better .   Patient appears to be tolerating treatment well  Remi Rester A 03/04/2017

## 2017-03-05 DIAGNOSIS — C679 Malignant neoplasm of bladder, unspecified: Secondary | ICD-10-CM | POA: Diagnosis present

## 2017-03-05 LAB — BASIC METABOLIC PANEL
ANION GAP: 12 (ref 5–15)
BUN: 26 mg/dL — ABNORMAL HIGH (ref 6–20)
CALCIUM: 8.7 mg/dL — AB (ref 8.9–10.3)
CHLORIDE: 98 mmol/L — AB (ref 101–111)
CO2: 26 mmol/L (ref 22–32)
CREATININE: 4.11 mg/dL — AB (ref 0.44–1.00)
GFR calc non Af Amer: 11 mL/min — ABNORMAL LOW (ref >60)
GFR, EST AFRICAN AMERICAN: 13 mL/min — AB (ref >60)
Glucose, Bld: 265 mg/dL — ABNORMAL HIGH (ref 65–99)
Potassium: 3.9 mmol/L (ref 3.5–5.1)
SODIUM: 136 mmol/L (ref 135–145)

## 2017-03-05 LAB — CBC
HCT: 26.8 % — ABNORMAL LOW (ref 36.0–46.0)
HEMOGLOBIN: 8.3 g/dL — AB (ref 12.0–15.0)
MCH: 30.2 pg (ref 26.0–34.0)
MCHC: 31 g/dL (ref 30.0–36.0)
MCV: 97.5 fL (ref 78.0–100.0)
PLATELETS: 186 10*3/uL (ref 150–400)
RBC: 2.75 MIL/uL — AB (ref 3.87–5.11)
RDW: 19.7 % — ABNORMAL HIGH (ref 11.5–15.5)
WBC: 7 10*3/uL (ref 4.0–10.5)

## 2017-03-05 LAB — GLUCOSE, CAPILLARY
GLUCOSE-CAPILLARY: 197 mg/dL — AB (ref 65–99)
GLUCOSE-CAPILLARY: 159 mg/dL — AB (ref 65–99)
GLUCOSE-CAPILLARY: 190 mg/dL — AB (ref 65–99)
GLUCOSE-CAPILLARY: 166 mg/dL — AB (ref 65–99)
Glucose-Capillary: 139 mg/dL — ABNORMAL HIGH (ref 65–99)

## 2017-03-05 NOTE — Progress Notes (Signed)
Patient ordered metoprolol. HR 57. Reassessed apical 58. Metoprolol held. Will continue to monitor. Bartholomew Crews, RN

## 2017-03-05 NOTE — Progress Notes (Signed)
Family Medicine Teaching Service Daily Progress Note Intern Pager: [phone]  Patient name: Brandi Arellano Medical record number: [id number] Date of birth: 09/14/1955 Age: 61 y.o. Gender: female  Primary Care Provider: Kerin Perna, NP Consultants: nephrology, vascular surgery, urology, psychiatry, cardiology Code Status: partial code (DNI, thinking about DNR)  [name]:  10/28 admitted to fpts  10/29 seen by vvs, permcath replaced, dialysis received, seen by urology 10/30 evaluated by psych  Assessment and Plan:  ESRD: HD MWF -Daily renal function panel  -Continue Lanthanum, Hectorol for bone mineral disorder  Anemia of chronic disease: Baseline hemoglobin appears to be 10-11.  Slowly trended down but currently stable at 8.3 this morning.  Iron panel consistent with anemia of chronic disease. -Daily CBC  Hypertension: Normotensive this morning. -Continue amlodipine  Hypothyroidism -Continue Synthroid 25 mcg daily  Squamous cell carcinoma of the bladder neck with invasion on cystoscopy 03/01/17. Patient declinced palliative medicine consult.  -Appreciate urology recs  -continue foley;   -oncology consult;   -may need cystectomy and bilateral nephrectomy if considered a surgical candidate  Type II diabetes: CBG.  A1c 6.8 -sSSI -CBG monitoring with meals, qhs  CAD s/p CABG in 2012: No anginal symptoms -Continue metoprolol 25 mg twice daily, atorvastatin and aspirin  FEN/GI:  -renal/carb modified, miralax -Continue feeding supplement  PPx: heparin  Dispo: Medically stable for discharge.  Awaiting ALF coordinated with outpatient dialysis  Subjective:  Lying on the bedside chair.  She has no concern or complaint this morning. 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

## 2017-03-05 NOTE — Progress Notes (Signed)
[facility] Progress Note   Subjective: Seen in room, resting in recliner, seems sleepy today. Denies CP, dyspnea, or abdominal pain at this time. Still considering options for bladder cancer treatment.  HD yest removed 1000- tolerated well   Objective Vitals:   03/04/17 1839 03/04/17 2053 03/05/17 0510 03/05/17 0910  BP: (!) 176/67 (!) 101/50 130/63 124/65  Pulse: 67 60 65 (!) 57  Resp: 18 18 18 18   Temp: 98.1 F (36.7 C) 98.4 F (36.9 C) 98.6 F (37 C) 98.4 F (36.9 C)  TempSrc: Oral Oral Oral Oral  SpO2: 97% 96% 96% 97%  Weight:  60 kg (132 lb 4.4 oz)    Height:       Physical Exam General: Frail female, NAD. 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) Dialysis Access: TDC in R chest  Additional Objective Labs: Basic Metabolic Panel: Recent Labs  Lab 03/02/17 1907 03/03/17 0917 03/04/17 0822 03/05/17 0351  NA 134* 134* 136 136  K 3.7 4.0 4.0 3.9  CL 97* 99* 100* 98*  CO2 25 23 25 26   GLUCOSE 118* 161* 163* 265*  BUN 19 37* 59* 26*  CREATININE 3.13* 4.74* 6.08* 4.11*  CALCIUM 8.8* 8.4* 8.7* 8.7*  PHOS 2.6 5.0* 6.9*  --    Liver Function Tests: Recent Labs  Lab 03/02/17 1907 03/03/17 0917 03/04/17 0822  ALBUMIN 3.2* 2.9* 2.7*   CBC: Recent Labs  Lab 03/02/17 1353 03/02/17 1908 03/03/17 0917 03/04/17 0823 03/05/17 0351  WBC 4.6 4.7 4.8 5.1 7.0  HGB 8.3* 9.9* 8.5* 8.1* 8.3*  HCT 25.9* 30.1* 27.3* 25.6* 26.8*  MCV 95.6 93.5 95.8 95.2 97.5  PLT 154 179 154 159 186   CBG: Recent Labs  Lab 03/03/17 2121 03/04/17 1208 03/04/17 1646 03/04/17 2048 03/05/17 0732  GLUCAP 162* 109* 271* 124* 197*   Medications: . sodium chloride    . sodium chloride     . amLODipine  5 mg Oral Daily  . aspirin EC  81 mg Oral BID  . atorvastatin  40 mg Oral QPM  . chlorhexidine  15 mL Mouth Rinse BID  . docusate sodium  100 mg Oral Daily  . doxercalciferol   3 mcg Intravenous Q M,W,F-HD  . feeding supplement (NEPRO CARB STEADY)  237 mL Oral BID BM  . feeding supplement (PRO-STAT SUGAR FREE 64)  30 mL Oral BID  . folic acid  1 mg Oral Daily  . gabapentin  100 mg Oral BID  . heparin  4,000 Units Dialysis Once in dialysis  . heparin  4,000 Units Dialysis Once in dialysis  . heparin  5,000 Units Subcutaneous Q8H  . insulin aspart  0-9 Units Subcutaneous TID AC & HS  . lanthanum  1,000 mg Oral TID WC  . levothyroxine  25 mcg Oral QAC breakfast  . mouth rinse  15 mL Mouth Rinse q12n4p  . metoprolol tartrate  25 mg Oral BID  . multivitamin with minerals  1 tablet Oral Daily  . sodium chloride flush  3 mL Intravenous Q12H  . 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 Oncology consulted, Dr. Jeffie Pollock recommending  bilateral nephrectomy and cystectomy. Treatment plan unclear at this point, pt not sure if wants to proceed. 2. ESRD: Back on MWF schedule, next HD 11/26. 3. Anemia: Hgb 8.3. ESA on hold after Dx malignancy. Will need discussion with oncology and patient. Supportive care with transfusion as needed. 4. Secondary hyperparathyroidism: Corr Ca/Phos rising. Continue VDRA, resumed binders 5. HTN/volume: BP variable, no edema on exam. EDW tentantively set at 55 kg. Continue amlodipine/metoprolol. 6. Nutrition: Alb 3.2 ->down to 2.7, continue pro-stat and nepro. 7. Hx AMS on admission: Thought to be due to pain medications. Resolved 8. Hx possible suicidal ideation. Seen by psych 10/30. No active SI. 9. DM: per primary    Veneta Penton, PA-C 03/05/2017, 9:29 AM  [facility] Pager: [phone]  Patient seen and examined, agree with above note with  above modifications. Pretty medically stable- ultimate plan regarding bladder CA is pending- next HD on Monday- cont with titration of HD related meds  Corliss Parish, MD 03/05/2017

## 2017-03-05 NOTE — Progress Notes (Signed)
4 Days Post-Op Subjective: No complaints this morning.  Resting comfortably in her recliner   Objective: Vital signs in last 24 hours: Temp:  [98.1 F (36.7 C)-98.6 F (37 C)] 98.4 F (36.9 C) (11/24 0910) Pulse Rate:  [57-67] 57 (11/24 0910) Resp:  [18] 18 (11/24 0910) BP: (101-176)/(50-67) 124/65 (11/24 0910) SpO2:  [96 %-97 %] 97 % (11/24 0910) Weight:  [60 kg (132 lb 4.4 oz)] 60 kg (132 lb 4.4 oz) (11/23 2053)  Intake/Output from previous day: 11/23 0701 - 11/24 0700 In: 363 [P.O.:360; I.V.:3] Out: 900 [Urine:400]  Intake/Output this shift: Total I/O In: 0  Out: 50 [Urine:50]  Physical Exam:  General: Alert and oriented CV: RRR, palpable distal pulses Lungs: CTAB, equal chest rise Abdomen: Soft, NTND, no rebound or guarding GU:  Foley draining cloudy-yellow urine Ext: NT, No erythema  Lab Results: Recent Labs    03/03/17 0917 03/04/17 0823 03/05/17 0351  HGB 8.5* 8.1* 8.3*  HCT 27.3* 25.6* 26.8*   BMET Recent Labs    03/04/17 0822 03/05/17 0351  NA 136 136  K 4.0 3.9  CL 100* 98*  CO2 25 26  GLUCOSE 163* 265*  BUN 59* 26*  CREATININE 6.08* 4.11*  CALCIUM 8.7* 8.7*     Studies/Results: No results found.  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 Jeffie Pollock  -Dr. Alen Blew with oncology has been consulted.  Possible bilateral nephrectomy and cystectomy due to extent of her disease.  The patient is still apprehensive about surgery at this point.  Final plan pending.  2.  ESRD -Currently tolerating HD   LOS: 26 days   Ellison Hughs, MD 03/05/2017, 11:42 AM  [facility] Pager: [phone]

## 2017-03-06 LAB — CBC
HEMATOCRIT: 27 % — AB (ref 36.0–46.0)
HEMOGLOBIN: 8.6 g/dL — AB (ref 12.0–15.0)
MCH: 30.4 pg (ref 26.0–34.0)
MCHC: 31.9 g/dL (ref 30.0–36.0)
MCV: 95.4 fL (ref 78.0–100.0)
Platelets: 175 10*3/uL (ref 150–400)
RBC: 2.83 MIL/uL — ABNORMAL LOW (ref 3.87–5.11)
RDW: 19.1 % — AB (ref 11.5–15.5)
WBC: 6.7 10*3/uL (ref 4.0–10.5)

## 2017-03-06 LAB — GLUCOSE, CAPILLARY
GLUCOSE-CAPILLARY: 144 mg/dL — AB (ref 65–99)
Glucose-Capillary: 181 mg/dL — ABNORMAL HIGH (ref 65–99)
Glucose-Capillary: 146 mg/dL — ABNORMAL HIGH (ref 65–99)
Glucose-Capillary: 307 mg/dL — ABNORMAL HIGH (ref 65–99)

## 2017-03-06 NOTE — Progress Notes (Signed)
Family Medicine Teaching Service Daily Progress Note Intern Pager: [phone]  Patient name: Brandi Arellano Medical record number: [id number] Date of birth: Apr 22, 1955 Age: 61 y.o. 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 Patient declinced palliative medicine consult.  -Urology following:   -recommends bilateral nephrectomy and cystectomy.  Patient is not interested  -Oncology consulted by urology -continue foley  ESRD: HD MWF -Daily renal function panel  -Continue Lanthanum, Hectorol for bone mineral disorder  Anemia of chronic disease: Baseline hemoglobin appears to be 10-11.  Slowly trended down but currently stable at 8.6 this morning.  Iron panel consistent with anemia of chronic disease. -Daily CBC  Hypertension: Normotensive this morning. -Continue amlodipine  Hypothyroidism -Continue Synthroid 25 mcg daily  Type II diabetes: CBG.  A1c 6.8 -sSSI -CBG monitoring with meals, qhs  CAD s/p CABG in 2012: No anginal symptoms -Continue metoprolol 25 mg twice daily, atorvastatin and aspirin  FEN/GI:  -renal/carb modified, miralax -Continue feeding supplement  PPx: heparin  Dispo: Medically stable. Awaiting ALF coordinated with outpatient dialysis.   Subjective:  Lying on the bedside chair.  She has no concern or complaint this morning except the blood draw Objective: Temp:  [98 F (36.7 C)-98.6 F (37 C)] 98.1 F (36.7 C) (11/25 0514) Pulse Rate:  [57-69] 62 (11/25 0514) Resp:  [17-18] 17 (11/25  0514) BP: (110-132)/(47-65) 132/56 (11/25 0514) SpO2:  [93 %-99 %] 93 % (11/25 0514) Weight:  [132 lb 5.2 oz (60 kg)] 132 lb 5.2 oz (60 kg) (11/24 2041)  Physical Exam: General: NAD, at HD CVS: RRR, loud systolic murmur Lungs: CTAB, no increased work of breathing Abdomen: Soft, nontender, nondistended MSK: AK on the right, transmetatarsal amputation on the left  Mercy Riding, MD 03/06/2017, 6:30 AM PGY-3, [place] Intern pager: [phone], text pages welcome

## 2017-03-06 NOTE — Progress Notes (Signed)
[facility] Progress Note   Subjective: Seen in room, resting in recliner, seems sleepy today. Denies CP, dyspnea, or abdominal pain at this time. Still considering options for bladder cancer treatment.  "I dont want to be cut on anymore"  Objective Vitals:   03/05/17 0910 03/05/17 1832 03/05/17 2041 03/06/17 0514  BP: 124/65 (!) 110/47 118/60 (!) 132/56  Pulse: (!) 57 63 69 62  Resp: 18 18 18 17   Temp: 98.4 F (36.9 C) 98 F (36.7 C) 98.6 F (37 C) 98.1 F (36.7 C)  TempSrc: Oral Oral Oral Oral  SpO2: 97% 99% 96% 93%  Weight:   60 kg (132 lb 5.2 oz)   Height:       Physical Exam General: Frail female, NAD. 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) Dialysis Access: TDC in R chest  Additional Objective Labs: Basic Metabolic Panel: Recent Labs  Lab 03/02/17 1907 03/03/17 0917 03/04/17 0822 03/05/17 0351  NA 134* 134* 136 136  K 3.7 4.0 4.0 3.9  CL 97* 99* 100* 98*  CO2 25 23 25 26   GLUCOSE 118* 161* 163* 265*  BUN 19 37* 59* 26*  CREATININE 3.13* 4.74* 6.08* 4.11*  CALCIUM 8.8* 8.4* 8.7* 8.7*  PHOS 2.6 5.0* 6.9*  --    Liver Function Tests: Recent Labs  Lab 03/02/17 1907 03/03/17 0917 03/04/17 0822  ALBUMIN 3.2* 2.9* 2.7*   CBC: Recent Labs  Lab 03/02/17 1908 03/03/17 0917 03/04/17 0823 03/05/17 0351 03/06/17 0641  WBC 4.7 4.8 5.1 7.0 6.7  HGB 9.9* 8.5* 8.1* 8.3* 8.6*  HCT 30.1* 27.3* 25.6* 26.8* 27.0*  MCV 93.5 95.8 95.2 97.5 95.4  PLT 179 154 159 186 175   CBG: Recent Labs  Lab 03/05/17 0732 03/05/17 1149 03/05/17 1649 03/05/17 2040 03/05/17 2133  GLUCAP 197* 139* 159* 190* 166*   Medications: . sodium chloride    . sodium chloride     . amLODipine  5 mg Oral Daily  . aspirin EC  81 mg Oral BID  . atorvastatin  40 mg Oral QPM  . chlorhexidine  15 mL Mouth Rinse BID  . docusate sodium  100 mg Oral Daily  . doxercalciferol  3  mcg Intravenous Q M,W,F-HD  . feeding supplement (NEPRO CARB STEADY)  237 mL Oral BID BM  . feeding supplement (PRO-STAT SUGAR FREE 64)  30 mL Oral BID  . folic acid  1 mg Oral Daily  . heparin  4,000 Units Dialysis Once in dialysis  . heparin  4,000 Units Dialysis Once in dialysis  . heparin  5,000 Units Subcutaneous Q8H  . insulin aspart  0-9 Units Subcutaneous TID AC & HS  . lanthanum  1,000 mg Oral TID WC  . levothyroxine  25 mcg Oral QAC breakfast  . mouth rinse  15 mL Mouth Rinse q12n4p  . metoprolol tartrate  25 mg Oral BID  . multivitamin with minerals  1 tablet Oral Daily  . sodium chloride flush  3 mL Intravenous Q12H  . 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 Oncology consulted, Dr. Jeffie Pollock recommending bilateral nephrectomy and cystectomy. Treatment plan unclear  at this point, pt not sure if wants to proceed. 2. ESRD: Back on MWF schedule via [place], next HD 11/26. 3. Anemia: Hgb 8.3. ESA on hold after Dx malignancy. Will need discussion with oncology and patient. Supportive care with transfusion as needed. 4. Secondary hyperparathyroidism: Corr Ca/Phos rising. Continue VDRA, resumed Fosrenol 5. HTN/volume: BP variable, no edema on exam.  Continue amlodipine/metoprolol. Weight higher of late ? 6. Nutrition: Alb 3.2 ->down to 2.7, continue pro-stat and nepro. 7. Hx AMS on admission: Thought to be due to pain medications. Resolved 8. Hx possible suicidal ideation. Seen by psych 10/30. No active SI. 9. DM: per primary    Corliss Parish, MD 03/06/2017

## 2017-03-07 DIAGNOSIS — R9439 Abnormal result of other cardiovascular function study: Secondary | ICD-10-CM

## 2017-03-07 DIAGNOSIS — D62 Acute posthemorrhagic anemia: Secondary | ICD-10-CM

## 2017-03-07 DIAGNOSIS — I208 Other forms of angina pectoris: Secondary | ICD-10-CM

## 2017-03-07 DIAGNOSIS — Z419 Encounter for procedure for purposes other than remedying health state, unspecified: Secondary | ICD-10-CM

## 2017-03-07 DIAGNOSIS — Z01818 Encounter for other preprocedural examination: Secondary | ICD-10-CM

## 2017-03-07 LAB — GLUCOSE, CAPILLARY
GLUCOSE-CAPILLARY: 165 mg/dL — AB (ref 65–99)
Glucose-Capillary: 102 mg/dL — ABNORMAL HIGH (ref 65–99)
Glucose-Capillary: 165 mg/dL — ABNORMAL HIGH (ref 65–99)
Glucose-Capillary: 135 mg/dL — ABNORMAL HIGH (ref 65–99)

## 2017-03-07 LAB — RENAL FUNCTION PANEL
ALBUMIN: 2.8 g/dL — AB (ref 3.5–5.0)
Anion gap: 11 (ref 5–15)
BUN: 58 mg/dL — AB (ref 6–20)
CHLORIDE: 99 mmol/L — AB (ref 101–111)
CO2: 25 mmol/L (ref 22–32)
Calcium: 8.6 mg/dL — ABNORMAL LOW (ref 8.9–10.3)
Creatinine, Ser: 7.51 mg/dL — ABNORMAL HIGH (ref 0.44–1.00)
GFR calc Af Amer: 6 mL/min — ABNORMAL LOW (ref >60)
GFR calc non Af Amer: 5 mL/min — ABNORMAL LOW (ref >60)
GLUCOSE: 167 mg/dL — AB (ref 65–99)
PHOSPHORUS: 4.6 mg/dL (ref 2.5–4.6)
POTASSIUM: 5.1 mmol/L (ref 3.5–5.1)
Sodium: 135 mmol/L (ref 135–145)

## 2017-03-07 LAB — CBC
HEMATOCRIT: 25.8 % — AB (ref 36.0–46.0)
Hemoglobin: 8.1 g/dL — ABNORMAL LOW (ref 12.0–15.0)
MCH: 30 pg (ref 26.0–34.0)
MCHC: 31.4 g/dL (ref 30.0–36.0)
MCV: 95.6 fL (ref 78.0–100.0)
Platelets: 162 10*3/uL (ref 150–400)
RBC: 2.7 MIL/uL — ABNORMAL LOW (ref 3.87–5.11)
RDW: 19.2 % — AB (ref 11.5–15.5)
WBC: 7.7 10*3/uL (ref 4.0–10.5)

## 2017-03-07 LAB — BASIC METABOLIC PANEL
Anion gap: 13 (ref 5–15)
BUN: 58 mg/dL — AB (ref 6–20)
CHLORIDE: 98 mmol/L — AB (ref 101–111)
CO2: 23 mmol/L (ref 22–32)
CREATININE: 7.36 mg/dL — AB (ref 0.44–1.00)
Calcium: 8.4 mg/dL — ABNORMAL LOW (ref 8.9–10.3)
GFR calc Af Amer: 6 mL/min — ABNORMAL LOW (ref >60)
GFR calc non Af Amer: 5 mL/min — ABNORMAL LOW (ref >60)
Glucose, Bld: 126 mg/dL — ABNORMAL HIGH (ref 65–99)
Potassium: 5.1 mmol/L (ref 3.5–5.1)
SODIUM: 134 mmol/L — AB (ref 135–145)

## 2017-03-07 MED ORDER — DOXERCALCIFEROL 4 MCG/2ML IV SOLN
INTRAVENOUS | Status: AC
  Administered 2017-03-07: 3 ug via INTRAVENOUS
  Filled 2017-03-07: qty 2

## 2017-03-07 NOTE — Progress Notes (Addendum)
4:93 CSW called [facility] again at end of day and admissions confirmed they can offer bed for patient. Patient will need HD clip to [place]. CSW to follow  3:55 CSW following for SNF placement. CSW called [facility] x2 today and left messages with reception for admissions. CSW awaiting call back to confirm if facility will be able to offer bed for patient. Note patient to be clipped to HD facility in [place] pending SNF placement. CSW to follow.  Estanislado Emms, [place]
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
## 2017-03-07 NOTE — Progress Notes (Signed)
Brandi Arellano Progress Note  Subjective: no c/o, still no SNF available  Vitals:   03/07/17 0807 03/07/17 0813 03/07/17 0830 03/07/17 0900  BP: (!) 141/91 (!) 166/92 (!) 159/76 (!) (P) 177/82  Pulse: (!) 56 (!) 58 (!) 55 (!) (P) 58  Resp: 16     Temp: 98.1 F (36.7 C)     TempSrc: Oral     SpO2: 95%     Weight: 59.1 kg (130 lb 4.7 oz)     Height:        Inpatient medications: . amLODipine  5 mg Oral Daily  . aspirin EC  81 mg Oral BID  . atorvastatin  40 mg Oral QPM  . chlorhexidine  15 mL Mouth Rinse BID  . docusate sodium  100 mg Oral Daily  . doxercalciferol  3 mcg Intravenous Q M,W,F-HD  . feeding supplement (NEPRO CARB STEADY)  237 mL Oral BID BM  . feeding supplement (PRO-STAT SUGAR FREE 64)  30 mL Oral BID  . folic acid  1 mg Oral Daily  . heparin  4,000 Units Dialysis Once in dialysis  . heparin  4,000 Units Dialysis Once in dialysis  . heparin  5,000 Units Subcutaneous Q8H  . insulin aspart  0-9 Units Subcutaneous TID AC & HS  . lanthanum  1,000 mg Oral TID WC  . levothyroxine  25 mcg Oral QAC breakfast  . mouth rinse  15 mL Mouth Rinse q12n4p  . metoprolol tartrate  25 mg Oral BID  . multivitamin with minerals  1 tablet Oral Daily  . sodium chloride flush  3 mL Intravenous Q12H  . thiamine  100 mg Oral Daily   . sodium chloride    . sodium chloride     sodium chloride, sodium chloride, acetaminophen **OR** acetaminophen, alteplase, heparin, lidocaine (PF), lidocaine-prilocaine, opium-belladonna, pentafluoroprop-tetrafluoroeth, senna-docusate  Exam: No distress No jvd Chest clear bilat RRR no mrg Abd soft ntnd  Foley cath in place Ext no edema, R BKA TDC R chest NF, ox 3  Dialysis: [place] [facility] MWF 4h 56.5kg 2/2.25 400/auto 1.5  Linear sodium UFP 4 -Hep 4000 R IJ TDC (new one placed 10/29) -hect 4 ug tiw -BMM: Fosrenol 1000mg  2 tabs tid       Impression: 1. SCCa of bladder neck w/ invasion - sp TURBT 11/20. Oncology consulted, Dr.  Jeffie Pollock recommending bilateral nephrectomy and cystectomy. Treatment plan unclear at this point, pt not sure if wants to proceed. 2. ESRD MWF HD 3. Anemia of CKD - ESA on hold d/t malignancy. Transfuse prn.  4. MBD of CKD - cont vdra/ fosrenol 5. HTN/ vol - no vol excess , cont norvasc/ MTP 6. Nutrition:Alb 3.2 ->down to 2.7, continue pro-stat and nepro 7. Hx AMS on admission: Thought to be due to pain medications. Resolved 8. Hx possible suicidal ideation. Seen by psych 10/30. No active SI. 9. DM: per primary   Plan - HD today  Brandi Splinter MD [facility] Are[facility] pager [phone]   03/07/2017, 9:40 AM   Recent Labs  Lab 03/03/17 0917 03/04/17 0822 03/05/17 0351 03/07/17 0309 03/07/17 0901  NA 134* 136 136 134* 135  K 4.0 4.0 3.9 5.1 5.1  CL 99* 100* 98* 98* 99*  CO2 23 25 26 23 25   GLUCOSE 161* 163* 265* 126* 167*  BUN 37* 59* 26* 58* 58*  CREATININE 4.74* 6.08* 4.11* 7.36* 7.51*  CALCIUM 8.4* 8.7* 8.7* 8.4* 8.6*  PHOS 5.0* 6.9*  --   --  4.6  Recent Labs  Lab 03/03/17 0917 03/04/17 0822 03/07/17 0901  ALBUMIN 2.9* 2.7* 2.8*   Recent Labs  Lab 03/05/17 0351 03/06/17 0641 03/07/17 0901  WBC 7.0 6.7 7.7  HGB 8.3* 8.6* 8.1*  HCT 26.8* 27.0* 25.8*  MCV 97.5 95.4 95.6  PLT 186 175 162   Iron/TIBC/Ferritin/ %Sat    Component Value Date/Time   IRON 123 02/16/2017 0900   TIBC 200 (L) 02/16/2017 0900   FERRITIN 1,388 (H) 02/16/2017 0900   IRONPCTSAT 61 (H) 02/16/2017 0900

## 2017-03-08 DIAGNOSIS — D631 Anemia in chronic kidney disease: Secondary | ICD-10-CM

## 2017-03-08 DIAGNOSIS — E119 Type 2 diabetes mellitus without complications: Secondary | ICD-10-CM

## 2017-03-08 LAB — GLUCOSE, CAPILLARY
GLUCOSE-CAPILLARY: 180 mg/dL — AB (ref 65–99)
GLUCOSE-CAPILLARY: 225 mg/dL — AB (ref 65–99)
GLUCOSE-CAPILLARY: 137 mg/dL — AB (ref 65–99)
Glucose-Capillary: 151 mg/dL — ABNORMAL HIGH (ref 65–99)

## 2017-03-08 NOTE — Care Management Note (Signed)
Case Management Note  Patient Details  Name: Brandi Arellano MRN: [id number] Date of Birth: 28-Mar-1956  Subjective/Objective:      CM following for progression and d/c planning.              Action/Plan: 03/08/2017 After talking with oncologist this pt has decided to have surgery as recommended by urology. Information shared with Shanon Brow renal PA. This CM notified attending, they are contacting urology for surgery to be planned, hopefully prior to d/c as pt currently would have to d/c to [facility].   Expected Discharge Date:                  Expected Discharge Plan:  Skilled Nursing Facility  In-House Referral:  Clinical Social Work  Discharge planning Services  CM Consult  Post Acute Care Choice:  NA Choice offered to:  NA  DME Arranged:  N/A DME Agency:  NA  HH Arranged:  NA HH Agency:  NA  Status of Service:  Completed, signed off  If discussed at [place] of Stay Meetings, dates discussed:    Additional Comments:  Adron Bene, RN 03/08/2017, 3:31 PM

## 2017-03-08 NOTE — Care Management Note (Addendum)
Case Management Note  Patient Details  Name: Prabhjot Maddux MRN: [id number] Date of Birth: [birth year]  Subjective/Objective:    CM following for progression and d/c planning.                 Action/Plan: 03/08/2017 All info faxed to [facility] 03/07/2017 pm this CM contacted this center, and faxed further info as requested await placement at [facility] in [place] area. Spoke with SNF in [place] who confirmed that they have a SNF bed and will transport to another HD center in [place] as [facility] has no available chairs for outpt HD.   Expected Discharge Date:                  Expected Discharge Plan:  Skilled Nursing Facility  In-House Referral:  Clinical Social Work  Discharge planning Services  CM Consult  Post Acute Care Choice:  NA Choice offered to:  NA  DME Arranged:  N/A DME Agency:  NA  HH Arranged:  NA HH Agency:  NA  Status of Service:  Completed, signed off  If discussed at [place] of Stay Meetings, dates discussed:    Additional Comments:  Adron Bene, RN 03/08/2017, 12:13 PM

## 2017-03-08 NOTE — Progress Notes (Signed)
Reason for Referral: Bladder cancer.  HPI: This is a 61 year old woman with a history of coronary artery disease and end-stage renal failure.  She is currently receiving hemodialysis.  She was hospitalized on 02/07/2017 for altered mental status and trauma to her dialysis catheter.  During her hospitalization, she underwent a CT scan of the abdomen and pelvis done on 02/06/2017 which showed a polypoid mass arising from the posterior base of the bladder concerning for malignancy.  A cystoscopy and transurethral resection of a bladder tumor performed by Dr. Jeffie Pollock on 03/01/2017.  The final pathology showed squamous cell carcinoma of the bladder with invasion into the lamina propria.  Muscularis propria is not present.  I was asked to comment about these findings regarding treatment options.  Clinically, she reports despite her end-stage renal disease she still makes urine.  She denies any hematuria or abdominal discomfort.  She denies any headaches or blurry vision.  She denies any chest pain or difficulty breathing.  She denies any cough or wheezing.  She does not report any change in her bowel habits.  Remaining review of systems unremarkable.  Past Medical History:  Diagnosis Date  . Anemia   . Bladder mass 02/09/2017  . CAD (coronary artery disease)    CABG  . CHF (congestive heart failure) (HCC)    Acute on chronic diastolic  . Complication of anesthesia   . Diabetes mellitus without complication (HCC)    Type II  . DVT (deep venous thrombosis) ([place])   . ESRD (end stage renal disease) ([place])    HD M-W-F  . Gangrene of lower extremity (HCC)    RLE  . GERD (gastroesophageal reflux disease)   . Hx of AKA (above knee amputation), right ([place])   . Hyperlipidemia   . Hypertension   . Hypothyroidism   . PONV (postoperative nausea and vomiting)   . Stroke [facility])    "light stroke" no deficits  . Thrombocytopenia ([place])   . Thyroid disease    Abnormal Thyroid function Test  . UTI (lower urinary  tract infection)   :  Past Surgical History:  Procedure Laterality Date  . ABDOMINAL ANGIOGRAM  12/26/2013   Procedure: ABDOMINAL ANGIOGRAM;  Surgeon: Serafina Bonfanti, MD;  Location: [facility] CATH LAB;  Service: Cardiovascular;;  . AMPUTATION Right 02/01/2014   Procedure: Right Below Knee Amputation;  Surgeon: Newt Minion, MD;  Location: [place];  Service: Orthopedics;  Laterality: Right;  . AMPUTATION Right 03/24/2014   Procedure: AMPUTATION ABOVE KNEE;  Surgeon: Newt Minion, MD;  Location: [place];  Service: Orthopedics;  Laterality: Right;  . ARCH AORTOGRAM  12/26/2013   Procedure: ARCH AORTOGRAM;  Surgeon: Serafina Coates, MD;  Location: [facility] CATH LAB;  Service: Cardiovascular;;  . AXILLARY ARTERY - BRACHIAL ARTERY BYPASS GRAFT  11/07/2013   At [place]     Bilateral  . CHOLECYSTECTOMY    . CORONARY ARTERY BYPASS GRAFT  2012   in [place], [place] W/ RETROGRADES Left 03/01/2017   Procedure: CYSTOSCOPY WITH RETROGRADE PYELOGRAM;  Surgeon: Irine Seal, MD;  Location: [facility];  Service: Urology;  Laterality: Left;  . FOOT AMPUTATION Bilateral   . HARDWARE REMOVAL Right 07/19/2014   Procedure: Removal Deep Hardware Right Femur;  Surgeon: Newt Minion, MD;  Location: [place];  Service: Orthopedics;  Laterality: Right;  Six screws and one condylar plate removed   . hemodialysis catheter    . INSERTION OF DIALYSIS CATHETER Right  02/07/2017   Procedure: INSERTION OF DIALYSIS CATHETER;  Surgeon: Rosetta Posner, MD;  Location: [place];  Service: Vascular;  Laterality: Right;  . LEFT HEART CATH AND CORS/GRAFTS ANGIOGRAPHY N/A 02/11/2017   Procedure: LEFT HEART CATH AND CORS/GRAFTS ANGIOGRAPHY;  Surgeon: Nelva Bush, MD;  Location: [place] CV LAB;  Service: Cardiovascular;  Laterality: N/A;  . LOWER EXTREMITY ANGIOGRAM N/A 12/26/2013   Procedure: LOWER EXTREMITY ANGIOGRAM;  Surgeon: Serafina Gaydos, MD;  Location: [facility] CATH LAB;  Service: Cardiovascular;   Laterality: N/A;  . TRANSURETHRAL RESECTION OF BLADDER TUMOR N/A 03/01/2017   Procedure: TRANSURETHRAL RESECTION OF LARGE BLADDER TUMOR (TURBT)FROM BLADDER NECK;  Surgeon: Irine Seal, MD;  Location: [facility];  Service: Urology;  Laterality: N/A;  . UPPER EXTREMITY ANGIOGRAM Right 12/26/2013   Procedure: UPPER EXTREMITY ANGIOGRAM;  Surgeon: Serafina Burgert, MD;  Location: [facility] CATH LAB;  Service: Cardiovascular;  Laterality: Right;  :   Current Facility-Administered Medications:  .  acetaminophen (TYLENOL) tablet 650 mg, 650 mg, Oral, Q6H PRN, 650 mg at 03/08/17 0442 **OR** acetaminophen (TYLENOL) suppository 650 mg, 650 mg, Rectal, Q6H PRN, Sela Hilding, MD .  amLODipine (NORVASC) tablet 5 mg, 5 mg, Oral, Daily, Sela Hilding, MD, 5 mg at 03/08/17 0916 .  aspirin EC tablet 81 mg, 81 mg, Oral, BID, Sela Hilding, MD, 81 mg at 03/08/17 0916 .  atorvastatin (LIPITOR) tablet 40 mg, 40 mg, Oral, QPM, Sela Hilding, MD, 40 mg at 03/07/17 1746 .  chlorhexidine (PERIDEX) 0.12 % solution 15 mL, 15 mL, Mouth Rinse, BID, Leeanne Rio, MD, 15 mL at 03/08/17 0916 .  docusate sodium (COLACE) capsule 100 mg, 100 mg, Oral, Daily, Guadalupe Dawn, MD, 100 mg at 03/03/17 1023 .  doxercalciferol (HECTOROL) injection 3 mcg, 3 mcg, Intravenous, Q M,W,F-HD, Valentina Gu, NP, 3 mcg at 03/07/17 1159 .  feeding supplement (NEPRO CARB STEADY) liquid 237 mL, 237 mL, Oral, BID BM, Stovall, Kathryn R, PA-C .  feeding supplement (PRO-STAT SUGAR FREE 64) liquid 30 mL, 30 mL, Oral, BID, Loren Racer, PA-C, 30 mL at 03/03/17 2252 .  folic acid (FOLVITE) tablet 1 mg, 1 mg, Oral, Daily, Sela Hilding, MD, 1 mg at 03/06/17 1132 .  heparin injection 5,000 Units, 5,000 Units, Subcutaneous, Q8H, End, Christopher, MD, 5,000 Units at 03/08/17 0617 .  insulin aspart (novoLOG) injection 0-9 Units, 0-9 Units, Subcutaneous, TID AC & HS, Eloise Levels, MD, 2 Units at 03/08/17 1206 .   lanthanum (FOSRENOL) chewable tablet 1,000 mg, 1,000 mg, Oral, TID WC, Corliss Parish, MD, 1,000 mg at 03/08/17 1206 .  levothyroxine (SYNTHROID, LEVOTHROID) tablet 25 mcg, 25 mcg, Oral, QAC breakfast, Sela Hilding, MD, 25 mcg at 03/08/17 0916 .  MEDLINE mouth rinse, 15 mL, Mouth Rinse, q12n4p, Leeanne Rio, MD, 15 mL at 03/06/17 1718 .  metoprolol tartrate (LOPRESSOR) tablet 25 mg, 25 mg, Oral, BID, Turner, Traci R, MD, 25 mg at 03/08/17 0916 .  multivitamin with minerals tablet 1 tablet, 1 tablet, Oral, Daily, Sela Hilding, MD, 1 tablet at 03/03/17 1023 .  opium-belladonna (B&O SUPPRETTES) 16.2-60 MG suppository 1 suppository, 1 suppository, Rectal, Q6H PRN, Rosalyn Gess, Diane, NP .  senna-docusate (Senokot-S) tablet 1 tablet, 1 tablet, Oral, QHS PRN, Rumball, Alison, DO .  sodium chloride flush (NS) 0.9 % injection 3 mL, 3 mL, Intravenous, Q12H, Sela Hilding, MD, 3 mL at 03/08/17 0923 .  thiamine (VITAMIN B-1) tablet 100 mg, 100 mg, Oral, Daily, Sela Hilding, MD, 100 mg at 03/03/17  1023  Facility-Administered Medications Ordered in Other Encounters:  .  0.9 %  sodium chloride infusion, , , Continuous PRN, Laretta Alstrom, CRNA:  Allergies  Allergen Reactions  . Diphenhydramine Nausea And Vomiting  . Tramadol Hcl Nausea And Vomiting  . Morphine And Related Nausea And Vomiting  :  Family History  Adopted: Yes  Problem Relation Age of Onset  . Hypertension Father   . Heart disease Father        Coronary Artery Disease  :  Social History   Socioeconomic History  . Marital status: Divorced    Spouse name: Not on file  . Number of children: Not on file  . Years of education: Not on file  . Highest education level: Not on file  Social Needs  . Financial resource strain: Not on file  . Food insecurity - worry: Not on file  . Food insecurity - inability: Not on file  . Transportation needs - medical: Not on file  . Transportation needs -  non-medical: Not on file  Occupational History  . Not on file  Tobacco Use  . Smoking status: Former Smoker    Years: 30.00    Types: Cigarettes  . Smokeless tobacco: Never Used  . Tobacco comment: quit in 2002  Substance and Sexual Activity  . Alcohol use: No  . Drug use: No  . Sexual activity: Not on file  Other Topics Concern  . Not on file  Social History Narrative   Pt currently living with her brother.   Independent w/ transfers bed>wheelchair  :  Pertinent items are noted in HPI.  Exam: Blood pressure (!) 146/78, pulse 61, temperature 97.8 F (36.6 C), temperature source Oral, resp. rate 18, height 5\' 6"  (1.676 m), weight 130 lb 4.7 oz (59.1 kg), SpO2 98 %. General appearance: alert and cooperative appears without distress. Throat: No oral thrush or ulcers.   Neck: No adenopathy. Resp: clear to auscultation bilaterally Chest wall: no tenderness Cardio: regular rate and rhythm, S1, S2 normal, no murmur, click, rub or gallop GI: soft, non-tender; bowel sounds normal; no masses,  no organomegaly Extremities: extremities normal, atraumatic, no cyanosis or edema  Recent Labs    03/06/17 0641 03/07/17 0901  WBC 6.7 7.7  HGB 8.6* 8.1*  HCT 27.0* 25.8*  PLT 175 162   Recent Labs    03/07/17 0309 03/07/17 0901  NA 134* 135  K 5.1 5.1  CL 98* 99*  CO2 23 25  GLUCOSE 126* 167*  BUN 58* 58*  CREATININE 7.36* 7.51*  CALCIUM 8.4* 8.6*      Ct Abdomen Pelvis W Contrast  Result Date: 02/06/2017 CLINICAL DATA:  61 year old female with abdominal pain. History of UTI and diabetes. Patient is on dialysis. EXAM: CT ABDOMEN AND PELVIS WITH CONTRAST TECHNIQUE: Multidetector CT imaging of the abdomen and pelvis was performed using the standard protocol following bolus administration of intravenous contrast. CONTRAST:  194mL ISOVUE-300 IOPAMIDOL (ISOVUE-300) INJECTION 61% COMPARISON:  Abdominal CT dated 01/01/2017 FINDINGS: Lower chest: Partially visualized moderate  bilateral pleural effusions, new compared to the CT of 01/01/2017. There is diffuse interstitial prominence consistent with pulmonary edema. Bibasilar airspace densities may represent atelectatic changes versus infiltrate. Multi vessel coronary vascular calcification noted. There is no intra-abdominal free air. There is a small free fluid within the pelvis. Hepatobiliary: The liver is unremarkable. There is mild dilatation of the intrahepatic biliary tree as well as common bile duct which may be related to post cholecystectomy changes. However if  there has been interval increase in the dilatation of the intrahepatic biliary tree which may represent some periportal edema. The common bile duct measures up to 13 mm in diameter. No retained calcified stone noted in the central CBD. Pancreas: Unremarkable. No pancreatic ductal dilatation or surrounding inflammatory changes. Spleen: Normal in size without focal abnormality. Adrenals/Urinary Tract: The adrenal glands are unremarkable. There is moderate bilateral renal parenchyma atrophy. Bilateral renovascular calcification noted. There is no hydronephrosis on either side. There is diffusely thickened and trabecular appearance of the bladder wall. There is a 3 x 2 x 3 cm heterogeneously enhancing mass arising from the posterior base of the bladder and to the right of the midline. There is associated mass effect and displacement of the balloon of the Foley catheter to the left. Findings most consistent with a neoplastic process possibly a transitional cell carcinoma. Further evaluation with cystoscopy is recommended. Diffuse thickening of the bladder wall with perivesical stranding may be related to infiltrative tumor or represent acute cystitis. Correlation with urinalysis recommended. Air within the urinary bladder, likely introduced via Foley catheter. Stomach/Bowel: Evaluation of the bowel is limited in the absence of oral contrast. Diffuse thickened appearance of the  colon likely related to underdistention. Correlation with clinical exam is recommended to exclude colitis. There is no bowel obstruction. The appendix is normal. Vascular/Lymphatic: There is advanced aortoiliac atherosclerotic disease. There is a 2.4 cm infrarenal aortic ectasia. The IVC is unremarkable. No portal venous gas identified. No adenopathy. Reproductive: The uterus is retroflexed and grossly unremarkable. The ovaries are poorly visualized. Other: Mild diffuse subcutaneous edema and anasarca. Musculoskeletal: No acute osseous pathology. IMPRESSION: 1. Polypoid mass arising from the posterior base of the bladder highly concerning for a neoplastic process. Diffuse thickened appearance of the bladder wall may be related to cystitis or infiltration of tumor. Correlation with urinalysis recommended. Further evaluation with cystoscopy, following resolution of acute inflammation, recommended. 2. Diffusely thickened: Likely related to underdistention. Clinical correlation is recommended to exclude colitis. No bowel obstruction. Normal appendix. 3. Partially visualized moderate bilateral pleural effusions, pulmonary edema, small ascites, and anasarca. 4.  Aortic Atherosclerosis (ICD10-I70.0). 5. A 2.4 cm infrarenal aortic ectasia. Electronically Signed   By: Anner Crete M.D.   On: 02/06/2017 23:57    Dg Chest Port 1 View  Result Date: 02/07/2017 CLINICAL DATA:  Status post dialysis catheter placement EXAM: PORTABLE CHEST 1 VIEW COMPARISON:  02/06/2017 FINDINGS: Right-sided dialysis catheter is in place with tips in the right atrium. No pneumothorax identified. Mild cardiac enlargement. Bilateral pleural effusions and pulmonary edema are similar to previous exam IMPRESSION: 1. No change in pulmonary edema and pleural effusions 2. No complications after right-sided dialysis catheter placement. Electronically Signed   By: Kerby Moors M.D.   On: 02/07/2017 15:43   Assessment and Plan:   61 year old  woman with the following issues:  1.  Squamous cell carcinoma of the bladder presented with a polypoid mass arising from the posterior base of the bladder.  This was biopsy-proven on 03/01/2017 and confirmed the pathology.  Imaging studies did not show any disease of the bladder.  Given her squamous cell histology, I see no role for systemic chemotherapy in her definitive treatment.  I agree with Dr. Jeffie Pollock that she will need cystectomy as the definitive modality to cure her cancer.  Radiation therapy can offer a palliative option if she elects not to proceed with surgery.  Dr. Jeffie Pollock will arrange follow-up for her with Dr. Tresa Moore who specializes in bladder  operations.  Without treatment, I believe her tumor can grow and cause more complications.  This will include bleeding, pain and potentially spread beyond the bladder.  If that happens, no curative option will exist and her prognosis would be poor.  2.  End-stage renal disease: She is currently receiving hemodialysis.  No medical oncology follow-up is needed at this time.

## 2017-03-08 NOTE — Progress Notes (Signed)
Family Medicine Teaching Service Daily Progress Note Intern Pager: [phone]  Patient name: Brandi Arellano Medical record number: [id number] Date of birth: [birth year] Age: 61 y.o. 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 Patient still declinced palliative medicine consult. Says she does not want surgery or any chemotherapy for treatment for her bladder cancer. She alert and oriented x4. Today she states that she did not know that she likely had a terminal diagnosis and wants to have a family meeting. We have formally consulted oncology. They said they will try and see the patient today or tomorrow -Urology following:   -recommends bilateral nephrectomy and cystectomy.  Patient is not interested -continue foley  ESRD: HD MWF -Continue Lanthanum, Hectorol for bone mineral disorder  Anemia of chronic disease: Baseline hemoglobin appears to be 10-11. Appears stable s/p cystectomy ranging from 8.1-8.5. Urine still looks blood tinged, but less so today. Iron panel consistent with anemia of chronic disease.  Hypertension: Normotensive -Continue amlodipine  Type II diabetes: CBG.  A1c 6.8 -sSSI -CBG monitoring with meals, qhs  FEN/GI:  -renal/carb modified, miralax -Continue feeding supplement  PPx: heparin  Dispo: Medically stable. Awaiting ALF coordinated with outpatient dialysis.   Subjective:  Sitting in chair. She is upset with not having placement yet. Does not want to go to [facility]. No other issue.    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

## 2017-03-08 NOTE — Care Management Note (Signed)
Case Management Note  Patient Details  Name: Brandi Arellano MRN: [id number] Date of Birth: 07-07-1955  Subjective/Objective:                    Action/Plan: 03/08/17 Notified at 4:30pm on 03/07/17 that this pt has a SNF bed available in [place] , [place]. This CM contacted that facility and inquired re HD centers in the area. This SNF transports pts to [place] for HD. This CM then contacted [name] intake and faxed all info to that center for review and placement of this pt @ 5:45pm on 03/07/2017.  10:30 am  Currently awaiting response from [facility] re HD in [place] , [place]    Expected Discharge Date:                  Expected Discharge Plan:  [place]  In-House Referral:  Clinical Social Work  Discharge planning Services  CM Consult  Post Acute Care Choice:  NA Choice offered to:  NA  DME Arranged:  N/A DME Agency:  NA  HH Arranged:  NA HH Agency:  NA  Status of Service:  Completed, signed off  If discussed at [employer], dates discussed:    Additional Comments:  Adron Bene, RN 03/08/2017, 10:20 AM

## 2017-03-08 NOTE — Progress Notes (Signed)
Called the [facility] to inquire about Dr. Hazeline Junker thoughts on patient. He had not been formally consulted previously and will review patient's case when able.  Olene Floss, MD [facility], PGY-3

## 2017-03-08 NOTE — Progress Notes (Signed)
Spoke with Dr. Louis Meckel with Urology. He spoke with Dr. Tammi Klippel. They will plan surgical outpatient follow up. He said it was ok to discontinue the foley catheter per patient preference.

## 2017-03-08 NOTE — Progress Notes (Signed)
Subjective:   No  Cos, noted she has changed her mind and agrees for plans for  GU surgery now  After talking to Oncology /   Objective Vital signs in last 24 hours: Vitals:   03/07/17 1659 03/07/17 2121 03/08/17 0502 03/08/17 0957  BP: (!) 142/82 (!) 125/52 129/73 (!) 146/78  Pulse: (!) 56 (!) 58 (!) 57 61  Resp: 17 18 18 18   Temp: 98.1 F (36.7 C) 98.3 F (36.8 C) 98.3 F (36.8 C) 97.8 F (36.6 C)  TempSrc: Oral Oral Oral Oral  SpO2: 96% 93% 95% 98%  Weight:      Height:       Weight change: 1.5 kg (3 lb 4.9 oz)  Physical Exam: General: Pleasant, NAD Heart: RRR, 3/6 systolic M Lungs: CTAB A/P Abdomen: active BS. Foley patent with yellow urine  Extremities: no pedal edema Dialysis Access: RIJ TDC drsg CDI  Dialysis: [facility] MWF 4h 56.5kg 2/2.25 400/auto 1.5 Linear sodium UFP 4 -Hep 4000 R IJ TDC (new one placed 10/29) -hect 4 ug tiw -BMM: Fosrenol 1000mg  2 tabs tid  Problem/Plan 1. SCCa of bladder neck w/ invasion - sp TURBT 11/20. Oncology consulted, Dr. Jeffie Pollock recommending bilateral nephrectomy and cystectomy.  And noted Oncology  Consulted noted this would be the most Definitive cure , she now agrees to have surgery after discussing  Options with Oncology.  2. ESRD MWF HD on schedule  3. Anemia of CKD - HGB 8.6  ESA on hold d/t malignancy. Transfuse prn.  4. MBD of CKD - cont vdra/ fosrenol 5. HTN/ vol - no vol excess , cont norvasc/ MTP 6. Nutrition:Alb 3.2 ->down to 2.7, continue pro-stat and nepro 7. Hx AMS on admission: Thought to be due to pain medications. Resolved 8. Hx possible suicidal ideation. Seen by psych 10/30. No active SI. 9. DM: per primary  Ernest Haber, PA-C [facility] [phone] 03/08/2017,3:00 PM  LOS: 29 days   Pt seen, examined and agree w A/P as above.  [name] MD [facility] pager [phone]   03/08/2017, 4:33 PM    Labs: Basic Metabolic Panel: Recent Labs  Lab 03/03/17 0917  03/04/17 0822 03/05/17 0351 03/07/17 0309 03/07/17 0901  NA 134* 136 136 134* 135  K 4.0 4.0 3.9 5.1 5.1  CL 99* 100* 98* 98* 99*  CO2 23 25 26 23 25   GLUCOSE 161* 163* 265* 126* 167*  BUN 37* 59* 26* 58* 58*  CREATININE 4.74* 6.08* 4.11* 7.36* 7.51*  CALCIUM 8.4* 8.7* 8.7* 8.4* 8.6*  PHOS 5.0* 6.9*  --   --  4.6   Liver Function Tests: Recent Labs  Lab 03/03/17 0917 03/04/17 0822 03/07/17 0901  ALBUMIN 2.9* 2.7* 2.8*   CBC: Recent Labs  Lab 03/03/17 0917 03/04/17 0823 03/05/17 0351 03/06/17 0641 03/07/17 0901  WBC 4.8 5.1 7.0 6.7 7.7  HGB 8.5* 8.1* 8.3* 8.6* 8.1*  HCT 27.3* 25.6* 26.8* 27.0* 25.8*  MCV 95.8 95.2 97.5 95.4 95.6  PLT 154 159 186 175 162  CBG: Recent Labs  Lab 03/07/17 1213 03/07/17 1700 03/07/17 2113 03/08/17 0750 03/08/17 1153  GLUCAP 102* 165* 135* 151* 180*   Medications:  . amLODipine  5 mg Oral Daily  . aspirin EC  81 mg Oral BID  . atorvastatin  40 mg Oral QPM  . chlorhexidine  15 mL Mouth Rinse BID  . docusate sodium  100 mg Oral Daily  . doxercalciferol  3 mcg Intravenous Q M,W,F-HD  .  feeding supplement (NEPRO CARB STEADY)  237 mL Oral BID BM  . feeding supplement (PRO-STAT SUGAR FREE 64)  30 mL Oral BID  . folic acid  1 mg Oral Daily  . heparin  5,000 Units Subcutaneous Q8H  . insulin aspart  0-9 Units Subcutaneous TID AC & HS  . lanthanum  1,000 mg Oral TID WC  . levothyroxine  25 mcg Oral QAC breakfast  . mouth rinse  15 mL Mouth Rinse q12n4p  . metoprolol tartrate  25 mg Oral BID  . multivitamin with minerals  1 tablet Oral Daily  . sodium chloride flush  3 mL Intravenous Q12H  . thiamine  100 mg Oral Daily

## 2017-03-09 DIAGNOSIS — Z7189 Other specified counseling: Secondary | ICD-10-CM

## 2017-03-09 DIAGNOSIS — Z515 Encounter for palliative care: Secondary | ICD-10-CM

## 2017-03-09 LAB — RENAL FUNCTION PANEL
ALBUMIN: 2.7 g/dL — AB (ref 3.5–5.0)
ANION GAP: 10 (ref 5–15)
BUN: 65 mg/dL — AB (ref 6–20)
CALCIUM: 9.2 mg/dL (ref 8.9–10.3)
CO2: 25 mmol/L (ref 22–32)
Chloride: 101 mmol/L (ref 101–111)
Creatinine, Ser: 6.99 mg/dL — ABNORMAL HIGH (ref 0.44–1.00)
GFR calc Af Amer: 7 mL/min — ABNORMAL LOW (ref >60)
GFR calc non Af Amer: 6 mL/min — ABNORMAL LOW (ref >60)
GLUCOSE: 172 mg/dL — AB (ref 65–99)
PHOSPHORUS: 5.2 mg/dL — AB (ref 2.5–4.6)
Potassium: 5 mmol/L (ref 3.5–5.1)
SODIUM: 136 mmol/L (ref 135–145)

## 2017-03-09 LAB — DIFFERENTIAL
Basophils Absolute: 0 10*3/uL (ref 0.0–0.1)
Basophils Relative: 0 %
EOS PCT: 5 %
Eosinophils Absolute: 0.3 10*3/uL (ref 0.0–0.7)
LYMPHS ABS: 1 10*3/uL (ref 0.7–4.0)
LYMPHS PCT: 16 %
MONO ABS: 0.4 10*3/uL (ref 0.1–1.0)
Monocytes Relative: 7 %
NEUTROS ABS: 4.5 10*3/uL (ref 1.7–7.7)
Neutrophils Relative %: 72 %

## 2017-03-09 LAB — CBC
HCT: 26.1 % — ABNORMAL LOW (ref 36.0–46.0)
HEMOGLOBIN: 8 g/dL — AB (ref 12.0–15.0)
MCH: 29.1 pg (ref 26.0–34.0)
MCHC: 30.7 g/dL (ref 30.0–36.0)
MCV: 94.9 fL (ref 78.0–100.0)
PLATELETS: 163 10*3/uL (ref 150–400)
RBC: 2.75 MIL/uL — ABNORMAL LOW (ref 3.87–5.11)
RDW: 18.8 % — ABNORMAL HIGH (ref 11.5–15.5)
WBC: 6.6 10*3/uL (ref 4.0–10.5)

## 2017-03-09 LAB — GLUCOSE, CAPILLARY
GLUCOSE-CAPILLARY: 185 mg/dL — AB (ref 65–99)
GLUCOSE-CAPILLARY: 194 mg/dL — AB (ref 65–99)
Glucose-Capillary: 197 mg/dL — ABNORMAL HIGH (ref 65–99)

## 2017-03-09 MED ORDER — HEPARIN SODIUM (PORCINE) 1000 UNIT/ML DIALYSIS: 20.0000 [IU]/kg | INTRAMUSCULAR | Status: DC | PRN

## 2017-03-09 MED ORDER — DOXERCALCIFEROL 4 MCG/2ML IV SOLN
INTRAVENOUS | Status: AC
  Administered 2017-03-09: 3 ug via INTRAVENOUS
  Filled 2017-03-09: qty 2

## 2017-03-09 NOTE — Progress Notes (Signed)
Subjective:  Looks like urology wants to f/u in OP setting.  Also looks like there may be a HD unit that will take Brandi Arellano, in [place], appreciate efforts of CM's.   Objective Vital signs in last 24 hours: Vitals:   03/09/17 0945 03/09/17 1015 03/09/17 1045 03/09/17 1100  BP: (!) 193/81 124/68 136/64 127/60  Pulse: 73 62 62 66  Resp: 18 18 19 18   Temp:    98.2 F (36.8 C)  TempSrc:    Oral  SpO2:    98%  Weight:    57.1 kg (125 lb 14.1 oz)  Height:       Weight change:   Physical Exam: General: Pleasant, NAD Heart: RRR, 3/6 systolic M Lungs: CTAB A/P Abdomen: active BS. Foley patent with yellow urine  Extremities: no pedal edema Dialysis Access: RIJ TDC drsg CDI  Dialysis: [facility] MWF 4h 56.5kg 2/2.25 400/auto 1.5 Linear sodium UFP 4 -Hep 4000 R IJ TDC (new one placed 10/29) -hect 4 ug tiw -BMM: Fosrenol 1000mg  2 tabs tid  Problem/Plan 1. SCCa of bladder neck w/ invasion - sp TURBT 11/20. Per primary team/ urology.  2. ESRD - on MWF HD. HD today.  3. Anemia of CKD - HGB 8.6  ESA on hold d/t malignancy. Transfuse prn.  4. MBD of CKD - cont vdra/ fosrenol 5. HTN/ vol - no vol excess , cont norvasc/ MTP 6. Nutrition:Alb 3.2 ->down to 2.7, continue pro-stat and nepro 7. Hx AMS on admission: Thought to be due to pain medications. Resolved 8. Hx possible suicidal ideation. Seen by psych 10/30. No active SI. 9. DM: per primary 10. Dispo: awaiting approval for HD unit in [place], maybe by tomorrow.     [name] MD [facility] pager [phone]   03/09/2017, 12:55 PM    Labs: Basic Metabolic Panel: Recent Labs  Lab 03/04/17 0822  03/07/17 0309 03/07/17 0901 03/09/17 0821  NA 136   < > 134* 135 136  K 4.0   < > 5.1 5.1 5.0  CL 100*   < > 98* 99* 101  CO2 25   < > 23 25 25   GLUCOSE 163*   < > 126* 167* 172*  BUN 59*   < > 58* 58* 65*  CREATININE 6.08*   < > 7.36* 7.51* 6.99*  CALCIUM 8.7*   < > 8.4* 8.6* 9.2  PHOS 6.9*  --   --   4.6 5.2*   < > = values in this interval not displayed.   Liver Function Tests: Recent Labs  Lab 03/04/17 0822 03/07/17 0901 03/09/17 0821  ALBUMIN 2.7* 2.8* 2.7*   CBC: Recent Labs  Lab 03/04/17 0823 03/05/17 0351 03/06/17 0641 03/07/17 0901 03/09/17 0821  WBC 5.1 7.0 6.7 7.7 6.6  NEUTROABS  --   --   --   --  4.5  HGB 8.1* 8.3* 8.6* 8.1* 8.0*  HCT 25.6* 26.8* 27.0* 25.8* 26.1*  MCV 95.2 97.5 95.4 95.6 94.9  PLT 159 186 175 162 163  CBG: Recent Labs  Lab 03/08/17 0750 03/08/17 1153 03/08/17 1640 03/08/17 2140 03/09/17 1236  GLUCAP 151* 180* 225* 137* 185*   Medications:  . amLODipine  5 mg Oral Daily  . aspirin EC  81 mg Oral BID  . atorvastatin  40 mg Oral QPM  . chlorhexidine  15 mL Mouth Rinse BID  . docusate sodium  100 mg Oral Daily  . doxercalciferol  3 mcg Intravenous Q M,W,F-HD  . feeding  supplement (NEPRO CARB STEADY)  237 mL Oral BID BM  . feeding supplement (PRO-STAT SUGAR FREE 64)  30 mL Oral BID  . folic acid  1 mg Oral Daily  . heparin  5,000 Units Subcutaneous Q8H  . insulin aspart  0-9 Units Subcutaneous TID AC & HS  . lanthanum  1,000 mg Oral TID WC  . levothyroxine  25 mcg Oral QAC breakfast  . mouth rinse  15 mL Mouth Rinse q12n4p  . metoprolol tartrate  25 mg Oral BID  . multivitamin with minerals  1 tablet Oral Daily  . sodium chloride flush  3 mL Intravenous Q12H  . thiamine  100 mg Oral Daily

## 2017-03-09 NOTE — Progress Notes (Signed)
Palliative Care Team paged for family meeting

## 2017-03-09 NOTE — Progress Notes (Signed)
Spoke to [facility] [phone] ext [place]. She verified she has all documentation needed from [facility], and is waiting on records from [facility] HD office in [place]. Once she has that and approval from MD at [facility] HD in [place] she will call back with HD schedule and chair time. She anticipated to have this information at the earliest by late today more likely tomorrow.

## 2017-03-09 NOTE — Progress Notes (Signed)
Family Medicine Teaching Service Daily Progress Note Intern Pager: [phone]  Patient name: Brandi Arellano Medical record number: [id number] Date of birth: December 24, 1955 Age: 61 y.o. 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 Confirmed on pathology on 03/09/2017. Patient wants surgery. Palliative consulted to help coordinate discussion with family. Oncology consulted and stated that chemotherapy is not a suitable treatment, but that palliative radiation is an option. Urology had discussed cystectomy with bilateral nephrectomy. They did say over the phone that patient was not likely a good surgical candidate, but that they would discuss the options for surgery in outpatient. Palliative met with patient and family today to help coordinate discussion of [place]. Patient has affirmed she wants surgery. She stated to me that she has been through surgery before and that she is going to "go with the flow". I did discuss with her that there was a high likely hood of dying in the operation given her cardiac history and ESRD. Patient  she demonstrated that she understood the risk and still wanted to pursue surgery. - appreciate urology recommendations - appreciate palliative recommendations  ESRD: HD MWF - per nephrology   Hypertension: Normotensive -Continue amlodipine  Type II diabetes: CBG stable.  A1c 6.8 -sSSI -CBG monitoring with meals, qhs  FEN/GI:  -renal/carb modified, miralax -Continue feeding supplement  PPx: heparin  Dispo:  Medically stable for discharge, awaiting ALF placement  Subjective:  At HD today. Patient is more upbeat than yesterday. 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

## 2017-03-09 NOTE — Consult Note (Signed)
Consultation Note Date: 03/09/17  Patient Name: Brandi Arellano  DOB: 10-21-1955  MRN: [id number]  Age / Sex: 61 y.o., female  PCP: Kerin Perna, NP Referring Physician: Irine Seal, MD  Reason for Consultation: Establishing goals of care  HPI/Patient Profile: 61 y.o. female  with past medical history of ESRD on HD, thyroid disease, thrombocytopenia, stroke, hypertension, hyperlipidemia, GERD, DVT, DM, CHF, CAD, anemia, right AKA, and bladder mass admitted on 02/06/2017 with altered mental status and severed perm cath. AMS likely due to overdose of pain medication. Evaluated by psych. No suicidal ideations. Perm cath has been replaced and dialysis continued. CT abdomen on 10/28 revealed polypoid mass arising from posterior base of bladder concerning for malignancy. Urology and nephrology following. A cystoscopy and transurethral resection of bladder tumor performed on 03/01/17. Final pathology reveals squamous cell carcinoma. Oncology consulted. No plan for systemic chemotherapy. Urology recommending bilateral nephrectomy and cystectomy. Palliative medicine consultation for goals of care.   Clinical Assessment and Goals of Care: I have reviewed medical records, discussed with care team, and initially met with patient at bedside to discuss diagnosis, GOC, EOL wishes, disposition and options. Later this after, met with patient's three daughters and her son at bedside who have arrived from [place].   Introduced Palliative Medicine as specialized medical care for people living with serious illness. It focuses on providing relief from the symptoms and stress of a serious illness. The goal is to improve quality of life for both the patient and the family.  We discussed a brief life review of the patient. Lives at home with brother, Brandi Arellano. The patient speaks in detail of her supportive family (six brothers and one  sister) and also with children and grandchildren. She moved to [place] from [place] to live with her brother and to receive better insurance and medical care. Brandi Arellano has been on dialysis for 3 years and feels she has maintained a good quality of life despite dialysis. Fairly independent and with good appetite.   Discussed in detail hospital diagnoses and interventions. After discussion, patient and children have a good understanding of cancer diagnosis and plan for surgery. We discussed her being high risk for surgery including death. Patient speaks of having "all the surgeries in the book" and willing to take the risk of surgery. Her son even inquires about a kidney/bladder transplant. I explained this is not an option and transplants are a very detailed and lengthy process.   Advanced directives, concepts specific to code status, and artifical feeding and hydration were discussed. Patient confirms partial code with wishes against intubation. She tells me she does want resuscitation attempted. One daughter cringes at the thought of this stating "I don't want you to be resuscitated" and being concerned of breaking ribs. Educated on medical recommendation for DNR with multiple co-morbidities and poor outcomes of CPR. Hard Choices copy given to daughter and patient. Encouraged them to continue [place] conversations as a family.   Reviewed living will with patient and her children. Living will is scanned into epic. Patient's  brother Brandi Arellano) is primary HCPOA and sister Horris Latino) is secondary HCPOA. Eldest daughter becomes upset with this. "Don't we have a say?" Patient continues to explain that she has discussed EOL wishes with her brother Brandi Arellano, including wishes for cremation. She also tells daughter she needs someone close and "involved" in her care, which is her brother. Daughter becomes upset and leaves the room.   Plan is for discharge to SNF for rehab and f/u outpatient with urology. Patient is hopeful to  "gain strength" and return home with brother after rehab.   Questions and concerns were addressed. Therapeutic listening and emotional support provided as patient shares many stories.     SUMMARY OF RECOMMENDATIONS    Limited code-no intubation.   Patient is planning to f/u outpatient with urology for surgery, understanding high risk for surgery.   Disposition plan is SNF for rehab. Continue HD.   May benefit from continued support from palliative services outpatient.   Code Status/Advance Care Planning:  Limited code-no intubation. Educated on medical recommendation for DNR.   Symptom Management:   Per attending  Palliative Prophylaxis:   Aspiration, Bowel Regimen, Delirium Protocol and Frequent Pain Assessment  Additional Recommendations (Limitations, Scope, Preferences):  Full Scope Treatment  Psycho-social/Spiritual:   Desire for further Chaplaincy support: yes  Additional Recommendations: Caregiving  Support/Resources  Prognosis:   Unable to determine  Discharge Planning: To Be Determined      Primary Diagnoses: Present on Admission: . Stable angina pectoris ([place]) . Nausea and vomiting . Bladder carcinoma ([place])   I have reviewed the medical record, interviewed the patient and family, and examined the patient. The following aspects are pertinent.  Past Medical History:  Diagnosis Date  . Anemia   . Bladder mass 02/09/2017  . CAD (coronary artery disease)    CABG  . CHF (congestive heart failure) (HCC)    Acute on chronic diastolic  . Complication of anesthesia   . Diabetes mellitus without complication (HCC)    Type II  . DVT (deep venous thrombosis) ([place])   . ESRD (end stage renal disease) ([place])    HD M-W-F  . Gangrene of lower extremity (HCC)    RLE  . GERD (gastroesophageal reflux disease)   . Hx of AKA (above knee amputation), right ([place])   . Hyperlipidemia   . Hypertension   . Hypothyroidism   . PONV (postoperative nausea and  vomiting)   . Stroke [facility])    "light stroke" no deficits  . Thrombocytopenia ([place])   . Thyroid disease    Abnormal Thyroid function Test  . UTI (lower urinary tract infection)    Social History   Socioeconomic History  . Marital status: Divorced    Spouse name: None  . Number of children: None  . Years of education: None  . Highest education level: None  Social Needs  . Financial resource strain: None  . Food insecurity - worry: None  . Food insecurity - inability: None  . Transportation needs - medical: None  . Transportation needs - non-medical: None  Occupational History  . None  Tobacco Use  . Smoking status: Former Smoker    Years: 30.00    Types: Cigarettes  . Smokeless tobacco: Never Used  . Tobacco comment: quit in 2002  Substance and Sexual Activity  . Alcohol use: No  . Drug use: No  . Sexual activity: None  Other Topics Concern  . None  Social History Narrative   Pt currently living with her brother.  Independent w/ transfers bed>wheelchair   Family History  Adopted: Yes  Problem Relation Age of Onset  . Hypertension Father   . Heart disease Father        Coronary Artery Disease   Scheduled Meds: . amLODipine  5 mg Oral Daily  . aspirin EC  81 mg Oral BID  . atorvastatin  40 mg Oral QPM  . chlorhexidine  15 mL Mouth Rinse BID  . docusate sodium  100 mg Oral Daily  . doxercalciferol  3 mcg Intravenous Q M,W,F-HD  . feeding supplement (NEPRO CARB STEADY)  237 mL Oral BID BM  . feeding supplement (PRO-STAT SUGAR FREE 64)  30 mL Oral BID  . folic acid  1 mg Oral Daily  . heparin  5,000 Units Subcutaneous Q8H  . insulin aspart  0-9 Units Subcutaneous TID AC & HS  . lanthanum  1,000 mg Oral TID WC  . levothyroxine  25 mcg Oral QAC breakfast  . mouth rinse  15 mL Mouth Rinse q12n4p  . metoprolol tartrate  25 mg Oral BID  . multivitamin with minerals  1 tablet Oral Daily  . sodium chloride flush  3 mL Intravenous Q12H  . thiamine  100 mg Oral Daily    Continuous Infusions: PRN Meds:.acetaminophen **OR** acetaminophen, heparin, opium-belladonna, senna-docusate Medications Prior to Admission:  Prior to Admission medications   Medication Sig Start Date End Date Taking? Authorizing Provider  amLODipine (NORVASC) 5 MG tablet Take 5 mg by mouth daily.   Yes [provider]  aspirin EC 81 MG tablet Take 81 mg by mouth daily.    Yes [provider]  atorvastatin (LIPITOR) 40 MG tablet Take 40 mg by mouth every evening.    Yes [provider]  cefpodoxime (VANTIN) 200 MG tablet Take 1 tab PO after dialysis for 4 doses Patient taking differently: Take 200 mg by mouth every 12 (twelve) hours. after dialysis 01/01/17  Yes Kirichenko, Tatyana, PA-C  insulin aspart (NOVOLOG) 100 UNIT/ML injection Inject 5 Units into the skin daily as needed for high blood sugar. Check BS twice daily : If >150 inject 5 units    Yes [provider]  lanthanum (FOSRENOL) 1000 MG chewable tablet Chew 1,000 mg by mouth See admin instructions. Pt to take 1 tablet with meals three times daily and 1 tablet with snacks   Yes [provider]  levothyroxine (SYNTHROID, LEVOTHROID) 25 MCG tablet Take 25 mcg by mouth daily before breakfast.   Yes [provider]   Allergies  Allergen Reactions  . Diphenhydramine Nausea And Vomiting  . Tramadol Hcl Nausea And Vomiting  . Morphine And Related Nausea And Vomiting   Review of Systems  Gastrointestinal: Positive for abdominal pain.  Genitourinary: Positive for dysuria.   Physical Exam  Constitutional: She is oriented to person, place, and time. She is cooperative.  Cardiovascular: Regular rhythm.  Pulmonary/Chest: Effort normal.  Abdominal: She exhibits distension. There is no tenderness.  Neurological: She is alert and oriented to person, place, and time.  Skin: Skin is warm and dry. There is pallor.  Psychiatric: She has a normal mood and affect. Her speech is normal and  behavior is normal. Cognition and memory are normal.  Nursing note and vitals reviewed.  Vital Signs: BP 127/60 (BP Location: Right Arm)   Pulse 66   Temp 98.2 F (36.8 C) (Oral)   Resp 18   Ht _0  (1.676 m)   Wt 57.1 kg (125 lb 14.1 oz)  SpO2 98%   BMI 20.32 kg/m  Pain Assessment: No/denies pain POSS *See Group Information*: 1-Acceptable,Awake and alert Pain Score: 0-No pain   SpO2: SpO2: 98 % O2 Device:SpO2: 98 % O2 Flow Rate: .O2 Flow Rate (L/min): 2 L/min  IO: Intake/output summary:   Intake/Output Summary (Last 24 hours) at 03/09/2017 1642 Last data filed at 03/09/2017 1100 Gross per 24 hour  Intake 363 ml  Output 2100 ml  Net -1737 ml    LBM: Last BM Date: 03/06/17 Baseline Weight: Weight: 63.5 kg (140 lb) Most recent weight: Weight: 57.1 kg (125 lb 14.1 oz)     Palliative Assessment/Data: PPS 60%   Flowsheet Rows     Most Recent Value  Intake Tab  Referral Department  Hospitalist  Unit at Time of Referral  Med/Surg Unit  Palliative Care Primary Diagnosis  Cancer  Date Notified  03/08/17  Palliative Care Type  New Palliative care  Reason for referral  Clarify Goals of Care  Date of Admission  02/06/17  Date first seen by Palliative Care  03/09/17  # of days Palliative referral response time  1 Day(s)  # of days IP prior to Palliative referral  30  Clinical Assessment  Palliative Performance Scale Score  60%  Psychosocial & Spiritual Assessment  Palliative Care Outcomes  Patient/Family meeting held?  Yes  Who was at the meeting?  patient, three daughters, and son  Palliative Care Outcomes  Clarified goals of care, Provided end of life care assistance, Provided psychosocial or spiritual support, ACP counseling assistance, Linked to palliative care logitudinal support      Time In/Out: 1145-1255, 1515-1600 Time Total: 115 min Greater than 50%  of this time was spent counseling and coordinating care related to the above assessment and  plan.  Signed by:  Ihor Dow, FNP-C Palliative Medicine Team  Phone: [phone] Fax: [phone]   Please contact Palliative Medicine Team phone at [phone] for questions and concerns.  For individual provider: See Shea Evans

## 2017-03-10 DIAGNOSIS — Z515 Encounter for palliative care: Secondary | ICD-10-CM

## 2017-03-10 LAB — URINALYSIS, ROUTINE W REFLEX MICROSCOPIC

## 2017-03-10 LAB — CBC
HEMATOCRIT: 30.7 % — AB (ref 36.0–46.0)
HEMOGLOBIN: 9.5 g/dL — AB (ref 12.0–15.0)
MCH: 29.3 pg (ref 26.0–34.0)
MCHC: 30.9 g/dL (ref 30.0–36.0)
MCV: 94.8 fL (ref 78.0–100.0)
Platelets: 200 10*3/uL (ref 150–400)
RBC: 3.24 MIL/uL — ABNORMAL LOW (ref 3.87–5.11)
RDW: 18.4 % — ABNORMAL HIGH (ref 11.5–15.5)
WBC: 8.3 10*3/uL (ref 4.0–10.5)

## 2017-03-10 LAB — URINALYSIS, MICROSCOPIC (REFLEX)

## 2017-03-10 LAB — GLUCOSE, CAPILLARY
GLUCOSE-CAPILLARY: 137 mg/dL — AB (ref 65–99)
GLUCOSE-CAPILLARY: 190 mg/dL — AB (ref 65–99)
GLUCOSE-CAPILLARY: 169 mg/dL — AB (ref 65–99)
Glucose-Capillary: 129 mg/dL — ABNORMAL HIGH (ref 65–99)

## 2017-03-10 NOTE — Progress Notes (Signed)
Subjective:  Tolerated HD yesterday on schedule.no cos today/ Per pt "ready for that Urology  Surgery " admit team notes "Awaiting formalization of surgical plans before patient is discharge."   Per CM  Has [facility] HD unit in [place] area and snh  "awaiting  Full paperwork"   Objective Vital signs in last 24 hours: Vitals:   03/09/17 1642 03/09/17 2221 03/10/17 0438 03/10/17 0813  BP: 106/64 (!) 122/53 140/60 126/73  Pulse: 60 61 60 65  Resp: 18 16 16 17   Temp: 98.3 F (36.8 C) 98 F (36.7 C) 98.1 F (36.7 C) 98.4 F (36.9 C)  TempSrc: Oral   Oral  SpO2: 98% 96% 96% 94%  Weight:  57.1 kg (125 lb 14.1 oz)    Height:       Weight change:   Physical Exam: General: alert NAD , appropriate  Heart: RRR, 3/6 sem , no rub or gallop Lungs: CTA , nonlabored breathing g Abdomen: BS pos , sift , nt, ND / Indwelling foley  With Brownish foul smelling urine  Extremities:  R AKA / L Transmet  Amp. On Left /no pedal edema  Dialysis Access: R  IJ P. Cath    Dialysis: [facility] MWF 4h 56.5kg 2/2.25 400/auto 1.5 Linear sodium UFP 4 -Hep 4000 R IJ TDC (new one placed 10/29) -hect 4 ug tiw -BMM: Fosrenol 1000mg  2 tabs tid  Problem/Plan  1. ESRD - on MWF HD. HD tomorrow  2.  SCCa of bladder neck w/ invasion - sp TURBT 11/20. RX Per urology team further Surgery Plans/ noted Primary team trying to confirm scheduled appointment  With Urology to  discuss surgery 3. Anemia of CKD - HGB 8.0> 9.5   ESA on hold d/t malignancy. Transfuse prn.  4. MBD of CKD - cont vdra/ fosrenol , corec ca 9.6 ,phos 4.6 5. HTN/ vol - no vol excess , cont norvasc/ MTP 6. Nutrition:Alb 3.2 ->down to 2.7, continue pro-stat and nepro 7. Hx AMS on admission: Thought to be due to pain medications. Resolved 8. Hx possible suicidal ideation. Seen by psych 10/30. No active SI. 9. DM: per primary 10.  Dispo: awaiting approval for HD unit in [place], and Urology plans for surgery?   Ernest Haber, PA-C [facility] [phone] 03/10/2017,12:41 PM  LOS: 31 days   Pt seen, examined and agree w A/P as above.  [name] MD [facility] pager [phone]   03/10/2017, 2:04 PM    Labs: Basic Metabolic Panel: Recent Labs  Lab 03/04/17 0822  03/07/17 0309 03/07/17 0901 03/09/17 0821  NA 136   < > 134* 135 136  K 4.0   < > 5.1 5.1 5.0  CL 100*   < > 98* 99* 101  CO2 25   < > 23 25 25   GLUCOSE 163*   < > 126* 167* 172*  BUN 59*   < > 58* 58* 65*  CREATININE 6.08*   < > 7.36* 7.51* 6.99*  CALCIUM 8.7*   < > 8.4* 8.6* 9.2  PHOS 6.9*  --   --  4.6 5.2*   < > = values in this interval not displayed.   Liver Function Tests: Recent Labs  Lab 03/04/17 0822 03/07/17 0901 03/09/17 0821  ALBUMIN 2.7* 2.8* 2.7*   No results for input(s): LIPASE, AMYLASE in the last 168 hours. No results for input(s): AMMONIA in the last 168 hours. CBC: Recent Labs  Lab 03/05/17 0351 03/06/17 0641 03/07/17 0901 03/09/17  6222 03/10/17 1104  WBC 7.0 6.7 7.7 6.6 8.3  NEUTROABS  --   --   --  4.5  --   HGB 8.3* 8.6* 8.1* 8.0* 9.5*  HCT 26.8* 27.0* 25.8* 26.1* 30.7*  MCV 97.5 95.4 95.6 94.9 94.8  PLT 186 175 162 163 200   Cardiac Enzymes: No results for input(s): CKTOTAL, CKMB, CKMBINDEX, TROPONINI in the last 168 hours. CBG: Recent Labs  Lab 03/09/17 1236 03/09/17 1636 03/09/17 2136 03/10/17 0734 03/10/17 1129  GLUCAP 185* 194* 197* 137* 129*    Studies/Results: No results found. Medications:  . amLODipine  5 mg Oral Daily  . aspirin EC  81 mg Oral BID  . atorvastatin  40 mg Oral QPM  . chlorhexidine  15 mL Mouth Rinse BID  . docusate sodium  100 mg Oral Daily  . doxercalciferol  3 mcg Intravenous Q M,W,F-HD  . feeding supplement (NEPRO CARB STEADY)  237 mL Oral BID BM  . feeding supplement (PRO-STAT SUGAR FREE 64)  30 mL Oral BID  . folic acid  1 mg Oral Daily  . heparin  5,000 Units Subcutaneous Q8H  . insulin aspart  0-9 Units Subcutaneous TID  AC & HS  . lanthanum  1,000 mg Oral TID WC  . levothyroxine  25 mcg Oral QAC breakfast  . mouth rinse  15 mL Mouth Rinse q12n4p  . metoprolol tartrate  25 mg Oral BID  . multivitamin with minerals  1 tablet Oral Daily  . sodium chloride flush  3 mL Intravenous Q12H  . thiamine  100 mg Oral Daily

## 2017-03-10 NOTE — Progress Notes (Signed)
Spoke with Dr. Gloriann Loan with urology regarding recent dark urine and foul smell. They feel that this change is most likely shedding of bladder cancer cells mixed with coagulated blood. They recommended flushing the foley catheter with normal saline multiple times a day. Urology said they will come and see her tomorrow.

## 2017-03-10 NOTE — Progress Notes (Signed)
Visited with patient per consult to support patient.  Patient was very talkative today and want her identity verified so that staff would provide care to right person.  Patient welcomed visit but had no specific needs at time. Just wanted to talk. Provided emotional support and listening. Will follow as needed.   03/10/17 1100  Clinical Encounter Type  Visited With Patient and family together;Health care provider  Visit Type Initial;Spiritual support  Referral From Nurse;Palliative care team  Spiritual Encounters  Spiritual Needs Emotional  Stress Factors  Patient Stress Factors None identified  Cristopher Peru, [facility], Pager [phone]

## 2017-03-10 NOTE — Progress Notes (Signed)
Brief Nutrition Note (seen previously on 02/14/17):  Pt reports excellent appetite, eating 100% of meals plus bed time snack. Reports not getting enough on meal trays; offered to send AM and PM snack as well but pt declined. Pt not drinking Nepro (reports it upsets her stomach), not taking Pro-Stat supplements. Discontinued supplements, modified bedtime snack and allowed double portion of protein foods (eggs, meat, etc) with all meals.   Current weight 57.1 kg, dry weight 56.5 kg. Continues HD on MWF.  Labs and meds reviewed.  Please consult if nutritional issues arise.   Kerman Passey MS, RD, [place], [place] [phone] Pager  [phone] Weekend/On-Call Pager

## 2017-03-10 NOTE — Progress Notes (Signed)
Family Medicine Teaching Service Daily Progress Note Intern Pager: [phone]  Patient name: Brandi Arellano Medical record number: [id number] Date of birth: December 25, 1955 Age: 61 y.o. Gender: female  Primary Care Provider: Kerin Perna, NP Consultants: nephrology, vascular surgery, urology, psychiatry, cardiology Code Status: partial code (DNI, thinking about DNR)  [name]:  10/28 admitted to fpts with AMS likely due to overdose of his pain medication 10/29 seen by [facility], permcath replaced, dialysis received, seen by urology 10/30 evaluated by psych  Assessment and Plan: Hematuria Pt has chronic indwelling catheter due to bladder cancer. Patient found to have very dark and foul smelling urine this AM. Pt is afebrile. She is endorsing mild dysuria. Will get a UA and urine culture. Will check CBC to screen for anemia.   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 Patient had family meeting with palliative yesterday. Endorses still wanting to get surgery. Will follow up with urology to confirm scheduled appointment to discuss surgery.  - appreciate urology recommendations - appreciate palliative recommendations  ESRD: HD MWF - per nephrology   Hypertension: Normotensive -Continue amlodipine  Type II diabetes: CBG stable.  A1c 6.8 -sSSI -CBG monitoring with meals, qhs  FEN/GI:  -renal/carb modified, miralax -Continue feeding supplement  PPx: heparin  Dispo: Awaiting formalization of surgical plans before patient is discharge. Urology has been paged to discuss further.   Subjective:  Patient endorse foul smelling urine and mild dysuria. She says the family meeting went well except with difficulty from oldest daughter not understanding her options.   Objective: Temp:  [98 F (36.7 C)-98.4 F (36.9 C)] 98.4 F (36.9 C) (11/29 0813) Pulse Rate:  [60-73] 65 (11/29 0813) Resp:  [16-19] 17 (11/29 0813) BP:  (106-193)/(53-81) 126/73 (11/29 0813) SpO2:  [94 %-98 %] 94 % (11/29 0813) Weight:  [125 lb 14.1 oz (57.1 kg)] 125 lb 14.1 oz (57.1 kg) (11/28 2221)  Physical Exam: General: NAD, resting sitting in chair CVS: RRR, III/VI murmur Lungs: CTAB, no increased work of breathing Abdomen: Soft, nontender, nondistended MSK: AK on the right, transmetatarsal amputation on the left  Bonnita Hollow, MD 03/10/2017, 9:18 AM PGY-1, [place] Intern pager: [phone], text pages welcome

## 2017-03-11 ENCOUNTER — Other Ambulatory Visit: Payer: Self-pay | Admitting: Urology

## 2017-03-11 DIAGNOSIS — C679 Malignant neoplasm of bladder, unspecified: Secondary | ICD-10-CM

## 2017-03-11 DIAGNOSIS — Z794 Long term (current) use of insulin: Secondary | ICD-10-CM

## 2017-03-11 DIAGNOSIS — I10 Essential (primary) hypertension: Secondary | ICD-10-CM

## 2017-03-11 LAB — BASIC METABOLIC PANEL
ANION GAP: 11 (ref 5–15)
BUN: 65 mg/dL — ABNORMAL HIGH (ref 6–20)
CALCIUM: 8.9 mg/dL (ref 8.9–10.3)
CO2: 24 mmol/L (ref 22–32)
Chloride: 99 mmol/L — ABNORMAL LOW (ref 101–111)
Creatinine, Ser: 7.25 mg/dL — ABNORMAL HIGH (ref 0.44–1.00)
GFR calc Af Amer: 6 mL/min — ABNORMAL LOW (ref >60)
GFR calc non Af Amer: 5 mL/min — ABNORMAL LOW (ref >60)
GLUCOSE: 177 mg/dL — AB (ref 65–99)
POTASSIUM: 5 mmol/L (ref 3.5–5.1)
Sodium: 134 mmol/L — ABNORMAL LOW (ref 135–145)

## 2017-03-11 LAB — GLUCOSE, CAPILLARY
GLUCOSE-CAPILLARY: 245 mg/dL — AB (ref 65–99)
Glucose-Capillary: 173 mg/dL — ABNORMAL HIGH (ref 65–99)
Glucose-Capillary: 174 mg/dL — ABNORMAL HIGH (ref 65–99)

## 2017-03-11 LAB — CBC
HEMATOCRIT: 25.8 % — AB (ref 36.0–46.0)
Hemoglobin: 8 g/dL — ABNORMAL LOW (ref 12.0–15.0)
MCH: 29.5 pg (ref 26.0–34.0)
MCHC: 31 g/dL (ref 30.0–36.0)
MCV: 95.2 fL (ref 78.0–100.0)
Platelets: 174 10*3/uL (ref 150–400)
RBC: 2.71 MIL/uL — ABNORMAL LOW (ref 3.87–5.11)
RDW: 18.6 % — AB (ref 11.5–15.5)
WBC: 8 10*3/uL (ref 4.0–10.5)

## 2017-03-11 MED ORDER — NEPRO/CARBSTEADY PO LIQD
237.0000 mL | Freq: Two times a day (BID) | ORAL | Status: DC
  Administered 2017-03-15: 237 mL via ORAL
  Filled 2017-03-11 (×11): qty 237

## 2017-03-11 MED ORDER — DOXERCALCIFEROL 4 MCG/2ML IV SOLN
INTRAVENOUS | Status: AC
  Filled 2017-03-11: qty 2

## 2017-03-11 NOTE — Progress Notes (Signed)
Family Medicine Teaching Service Daily Progress Note Intern Pager: [phone]  Patient name: Brandi Arellano Medical record number: [id number] Date of birth: May 07, 1955 Age: 61 y.o. 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 Tresa Moore with Urology spoke with patient this AM to discuss surgery. Plan for total GUectomy in (tentatively in January). Patient has an chronically indwelling catheter draining blood and tumor cells. This appear dark and is foul smelling, but is not feculant or infective. Per urology catheter can be removed, but may stay in per patient preference. Urology said no need to change even up until surgery. Patient today says she want catheter to stay in for her comfort. I discussed urology recommendations with her. Per urology, patient can be discharged from inpatient. Patient will need transport to surgery from ALF upon discharge.  - Please direct all non-urgent urological questions to Dr. Tresa Moore at [phone]  ESRD: HD MWF - per nephrology   Hypertension: Normotensive -Continue amlodipine  Type II diabetes: CBG stable.  A1c 6.8 -sSSI -CBG monitoring with meals, qhs  FEN/GI:  -renal/carb modified, miralax -Continue feeding supplement  PPx: heparin  Dispo: Medically stable for discharge, pending placement with HD   Subjective:  Patient feels fine today. She still wants surgery,but is concerned that waiting until January will make it "spread" and will be "too late".   Objective: Temp:  [97.7 F (36.5  C)-98.9 F (37.2 C)] 97.7 F (36.5 C) (11/30 0452) Pulse Rate:  [60-65] 65 (11/30 0452) Resp:  [14-18] 14 (11/30 0452) BP: (119-144)/(56-79) 144/79 (11/30 0452) SpO2:  [95 %-96 %] 95 % (11/30 0452)  Physical Exam: General: NAD, resting sitting in chair CVS: RRR, III/VI murmur Lungs: CTAB, no increased work of breathing Abdomen: Soft, nontender, nondistended MSK: AK on the right, transmetatarsal amputation on the left  Bonnita Hollow, MD 03/11/2017, 8:58 AM PGY-1, [place] Intern pager: [phone], text pages welcome

## 2017-03-11 NOTE — Clinical Social Work Note (Signed)
Nurse case manager continuing to follow up on progress with patient being placed at a dialysis center in [place], [place] - please see her note. CSW contacted [facility] facility and left message with admissions director Suanne Marker to return call so that update could be provided regarding progress with dialysis center placement. CSW will follow-up with facility admissions director on Monday.  Miquan Tandon Givens, MSW, LCSW Licensed Clinical Social Worker [place] [phone]

## 2017-03-11 NOTE — Progress Notes (Signed)
[facility] Progress Note   Dialysis Orders: [place] MWF 4h 56.5kg 2/2.25 400/auto 1.5 Linear sodium UFP 4 -Hep 4000 R IJ TDC (new one placed 10/29) -hect 4 ug tiw -BMM: Fosrenol 1000mg  2 tabs tid  Assessment/Plan: 1. ESRD - onMWF HD. Tolerating well today goal 3 L 2. SCCa of bladder neck w/ invasion - sp cystectomy andTURBT 11/20 with rare and aggressive histology - Urology note suggests surgery could be scheduled for early Jan 2019; chronic indwelling foley with brown urine- foul odor 3. Anemia of CKD - HGB 8.0> 9.5>8  ESA on hold d/t malignancy. Transfuse prn.  4. MBD of CKD - cont vdra/ fosrenol  5. HTN/ vol - no vol excess , cont norvasc/ MTP - net UF will be about 2.5 L today 6. Nutrition:Alb 3.2 ->down to 2.7, continue pro-stat and add nepro 7. Hx AMS on admission: Thought to be due to pain medications. Resolved 8. Hx possible suicidal ideation. Seen by psych 10/30. No active SI. 9. DM: per primary 10.  Dispo: awaiting approval for a [facility] HD unit in [place], and Urology plans for surgery? It is very unfortunate that we cannot find a local health care facility to accept her.  Brandi Jacobson, PA-C [facility] Beeper [phone] 03/11/2017,11:13 AM  LOS: 32 days   Pt seen, examined and agree w A/P as above.  [name] MD [facility] pager [phone]   03/11/2017, 1:19 PM    Subjective:   Tells me her doctor said her surgery would be in about 2 months - she doesn't want to wait that long  Objective Vitals:   03/11/17 0830 03/11/17 0900 03/11/17 0930 03/11/17 1000  BP: (!) 144/71 (!) 113/58 (!) 92/50 126/62  Pulse: (!) 54 (!) 54 (!) 55 (!) 55  Resp:      Temp:      TempSrc:      SpO2:      Weight:      Height:       Physical Exam General: NAD on HD Heart: RRR 3/7 murmur Lungs: no rales Abdomen: soft NT GU- foley with brown urine- foul odor Extremities: no LE edema Dialysis Access: right IJ Qb  400   Additional Objective Labs: Basic Metabolic Panel: Recent Labs  Lab 03/07/17 0901 03/09/17 0821 03/11/17 1026  NA 135 136 134*  K 5.1 5.0 5.0  CL 99* 101 99*  CO2 25 25 24   GLUCOSE 167* 172* 177*  BUN 58* 65* 65*  CREATININE 7.51* 6.99* 7.25*  CALCIUM 8.6* 9.2 8.9  PHOS 4.6 5.2*  --    Liver Function Tests: Recent Labs  Lab 03/07/17 0901 03/09/17 0821  ALBUMIN 2.8* 2.7*   CBC: Recent Labs  Lab 03/06/17 0641 03/07/17 0901 03/09/17 0821 03/10/17 1104 03/11/17 1026  WBC 6.7 7.7 6.6 8.3 8.0  NEUTROABS  --   --  4.5  --   --   HGB 8.6* 8.1* 8.0* 9.5* 8.0*  HCT 27.0* 25.8* 26.1* 30.7* 25.8*  MCV 95.4 95.6 94.9 94.8 95.2  PLT 175 162 163 200 174   Blood Culture    Component Value Date/Time   SDES URINE, CATHETERIZED 02/24/2017 1429   SPECREQUEST NONE 02/24/2017 1429   CULT NO GROWTH 02/24/2017 1429   REPTSTATUS 02/25/2017 FINAL 02/24/2017 1429    CBG: Recent Labs  Lab 03/09/17 2136 03/10/17 0734 03/10/17 1129 03/10/17 1705 03/10/17 2205  GLUCAP 197* 137* 129* 190* 169*    Lab Results  Component Value Date  INR 1.03 02/11/2017   INR 1.12 02/07/2017   INR 1.29 07/19/2014   Studies/Results: No results found. Medications:  . amLODipine  5 mg Oral Daily  . aspirin EC  81 mg Oral BID  . atorvastatin  40 mg Oral QPM  . chlorhexidine  15 mL Mouth Rinse BID  . docusate sodium  100 mg Oral Daily  . doxercalciferol      . doxercalciferol  3 mcg Intravenous Q M,W,F-HD  . folic acid  1 mg Oral Daily  . heparin  5,000 Units Subcutaneous Q8H  . insulin aspart  0-9 Units Subcutaneous TID AC & HS  . lanthanum  1,000 mg Oral TID WC  . levothyroxine  25 mcg Oral QAC breakfast  . mouth rinse  15 mL Mouth Rinse q12n4p  . metoprolol tartrate  25 mg Oral BID  . multivitamin with minerals  1 tablet Oral Daily  . sodium chloride flush  3 mL Intravenous Q12H  . thiamine  100 mg Oral Daily

## 2017-03-11 NOTE — Progress Notes (Signed)
10 Days Post-Op   Subjective/Chief Complaint:  1 - Primary Squamous Cell Bladder Cancer - large likely T2-T3 (clinically) bladder cancer by CT and surgical transurethral resection 02/2017 on eval hematuria. No locally advanced or distant disease on staging imaging. Medical oncology eval Alen Blew) feels minimal role for chemo with this histology.   2 - End Stage Renal Disease - on hemodialysis via [place] perm-cath x few years due to medical renal disease. At baseline makes minimal urine daily.   PMH sig for CAD/CABG (cath 02/2017 with non-intervention ischemia, med mgmt's only), Carotid stent, open chole, lower midline cesarean x3. Rt AKA, Lt partial foot amputation. She live in [place] with her brother and sister who help her with some ADL's and is remarkably independent. 3 daughters, one in [place], one in [place], one in [place].   Today "Brandi Arellano" is seen to discuss further management of her rare / aggressive histology bladder cancer that is symptomatic in setting of ESRD.    Objective: Vital signs in last 24 hours: Temp:  [97.7 F (36.5 C)-98.9 F (37.2 C)] 97.7 F (36.5 C) (11/30 0452) Pulse Rate:  [60-65] 65 (11/30 0452) Resp:  [14-18] 14 (11/30 0452) BP: (119-144)/(56-79) 144/79 (11/30 0452) SpO2:  [94 %-96 %] 95 % (11/30 0452) Last BM Date: 03/09/17  Intake/Output from previous day: 11/29 0701 - 11/30 0700 In: 373 [P.O.:360; I.V.:3] Out: 150 [Urine:150] Intake/Output this shift: No intake/output data recorded.  General appearance: alert, cooperative and appears older than stated age Eyes: negative Nose: Nares normal. Septum midline. Mucosa normal. No drainage or sinus tenderness. Throat: lips, mucosa, and tongue normal; teeth and gums normal Neck: bilateral neck scars c/w prior carotid intervention.  Back: symmetric, no curvature. ROM normal. No CVA tenderness. Resp: non-labored on room air.  GI: soft, non-tender; bowel sounds normal; no masses,  no organomegaly and prior infraumilical  and RUQ scars noted.  Pelvic: vagina normal without discharge and foley in place wtih some debris and hematuria as expected.  Extremities: well healed RLE AKA stump. LLE partial foot stump w/o obvious ongoing infection.  Skin: Skin color, texture, turgor normal. No rashes or lesions Neurologic: Grossly normal  Lab Results:  Recent Labs    03/09/17 0821 03/10/17 1104  WBC 6.6 8.3  HGB 8.0* 9.5*  HCT 26.1* 30.7*  PLT 163 200   BMET Recent Labs    03/09/17 0821  NA 136  K 5.0  CL 101  CO2 25  GLUCOSE 172*  BUN 65*  CREATININE 6.99*  CALCIUM 9.2   PT/INR No results for input(s): LABPROT, INR in the last 72 hours. ABG No results for input(s): PHART, HCO3 in the last 72 hours.  Invalid input(s): PCO2, PO2  Studies/Results: No results found.  Anti-infectives: Anti-infectives (From admission, onward)   Start     Dose/Rate Route Frequency Ordered Stop   03/01/17 0916  ceFAZolin (ANCEF) 2-4 GM/100ML-% IVPB    Comments:  Williford, Peggy   : cabinet override      03/01/17 0916 03/01/17 0939   03/01/17 0845  gentamicin (GARAMYCIN) IVPB 80 mg     80 mg 100 mL/hr over 30 Minutes Intravenous 30 min pre-op 02/28/17 1409 03/01/17 1025   03/01/17 0815  ceFAZolin (ANCEF) IVPB 2g/100 mL premix     2 g 200 mL/hr over 30 Minutes Intravenous 30 min pre-op 02/28/17 1726 03/01/17 0939   02/07/17 2000  ceFEPIme (MAXIPIME) 2 g in dextrose 5 % 50 mL IVPB  Status:  Discontinued  2 g 100 mL/hr over 30 Minutes Intravenous Every M-W-F (2000) 02/07/17 0144 02/08/17 1141   02/07/17 0145  ceFEPIme (MAXIPIME) 2 g in dextrose 5 % 50 mL IVPB     2 g 100 mL/hr over 30 Minutes Intravenous  Once 02/07/17 0137 02/07/17 9150      Assessment/Plan:  1 - Primary Squamous Cell Bladder Cancer - discussed rare and aggressive histology that is clinically localized. This is known to be relatively chemoresistant. Options of curative intent surgery (total GUectomy with bilateral nephrectomy, cystectomy,  node dissection, hysterectomy, bilatteral oophorectomy), palliative radiation, or purely palliative management. I would estimate life expectance 6-45mos untreated and continued hematuria.   She understands her overall health is not good, but she does have good quality of life and enjoys her family. Her goal is long term survival, even if it involves substantial short term risk and she would like to proceed with curative intent surgeyr in elective setting.  I do not feel further cardiac clearance is necessary as she has non-procedural ischemia (med mgm't only).  Frankly discussed risks, benefits, alternatives, expected peri-op course and that she is at least 10X higher risk for all complications included death given baseline comorbidity.    2 - End Stage Renal Disease - greatly appreciate nephrology team comanagment. WE will try to coordinate her cancer surgery so that it occurs day after dialysis and prompt transfer to [facility] post-op to allow for further renal replacement therapy in post-op setting (surgery likely in early Jan 2019).   Please contact me directly with questions anytime.   [facility], Climmie Buelow 03/11/2017

## 2017-03-11 NOTE — Care Management Note (Signed)
Case Management Note  Patient Details  Name: Brandi Arellano MRN: [id number] Date of Birth: [birth year]  Subjective/Objective:     CM following for progression and d/c planning.                Action/Plan: 03/10/2017 Await response from [facility] intake. They are currently working on outpatient HD placement in [place], [place]. This CM placed a call to [name] intake @ 8:50am re progress on HD placement. This pt has an accepting SNF bed in [place].  12n no return call therefore this CM placed another call to [facility] intake and spoke directed to [name] who has been working on placement at [place], [place] HD center . Ms Rubio's cell number is [phone].  [name] states that she will contact the HD center again as they have all the info required and the delay at this time is due to a response from [facility].  Noted concern that pt must d/c to a facility that is not local. This is regretiable however as we had only one SNF in the area and there was no available HD bed we are unable to place this pt locally. On 11/27 an attempt was made to place this pt locally again, however a HD outpt chair was not available.   Expected Discharge Date:                  Expected Discharge Plan:  Skilled Nursing Facility  In-House Referral:  Clinical Social Work  Discharge planning Services  CM Consult  Post Acute Care Choice:  NA Choice offered to:  NA  DME Arranged:  N/A DME Agency:  NA  HH Arranged:  NA HH Agency:  NA  Status of Service:  Completed, signed off  If discussed at [place] of Stay Meetings, dates discussed:    Additional Comments:  Adron Bene, RN 03/11/2017, 12:33 PM

## 2017-03-12 LAB — GLUCOSE, CAPILLARY
GLUCOSE-CAPILLARY: 140 mg/dL — AB (ref 65–99)
Glucose-Capillary: 140 mg/dL — ABNORMAL HIGH (ref 65–99)
Glucose-Capillary: 262 mg/dL — ABNORMAL HIGH (ref 65–99)
Glucose-Capillary: 122 mg/dL — ABNORMAL HIGH (ref 65–99)

## 2017-03-12 NOTE — Progress Notes (Signed)
[facility] Progress Note   Dialysis Orders: [place] MWF 4h 56.5kg 2/2.25 400/auto 1.5 Linear sodium UFP 4 -Hep 4000 R IJ TDC (new one placed 10/29) -hect 4 ug tiw -BMM: Fosrenol 1000mg  2 tabs tid  Assessment/Plan: 1. ESRD - onMWF HD. post HD wt 53.2 Friday - next HD Monday 2. SCCa of bladder neck w/ invasion - sp cystectomy andTURBT 11/20 with rare and aggressive histology - Urology note suggests surgery could be scheduled for early Jan 2019; chronic indwelling foley with brown urine- foul odor 3. Anemia of CKD - HGB 8.0> 9.5>8ESA on hold d/t malignancy. Transfuse prn. 61% sat 02/16/17 4. MBD of CKD - cont vdra/ fosrenol 5. HTN/ vol - no vol excess , cont norvasc/ MTP - net UF 2.5 L 11/30 6. Nutrition:Alb 3.2 ->down to 2.7, continue pro-stat and add nepro 7. Hx AMS on admission: Thought to be due to pain medications. Resolved 8. Hx possible suicidal ideation. Seen by psych 10/30. No active SI. 9. DM: per primary 10. Dispo: awaiting approval for a [facility] HD unit in [place], and Urology plans for surgery? It is very unfortunate that we cannot find a local health care facility to accept her. SW notes indicates possible d/c Monday  Myriam Jacobson, [facility] Beeper [phone] 03/12/2017,10:32 AM  LOS: 33 days   Pt seen, examined and agree w A/P as above.  [name] MD [facility] pager [phone]   03/12/2017, 1:46 PM    Subjective:   No problems - doesn't know why she has to go so far away after d/c.  Objective Vitals:   03/11/17 1714 03/11/17 2111 03/12/17 0546 03/12/17 1000  BP: 117/72 128/66 (!) 122/56 129/65  Pulse: (!) 57 67 65 62  Resp: 18 17 18    Temp: 98.9 F (37.2 C) 98.6 F (37 C) 98.2 F (36.8 C) 98.2 F (36.8 C)  TempSrc: Oral Oral Oral Oral  SpO2: 96% 98% 96% 97%  Weight:      Height:       Physical Exam General: comfortable in recliner Heart: RRR Lungs: no rales Abdomen:  Soft  NT GU: foley dark - foul odor Extremities: no edema Dialysis Access: right IJ    Additional Objective Labs: Basic Metabolic Panel: Recent Labs  Lab 03/07/17 0901 03/09/17 0821 03/11/17 1026  NA 135 136 134*  K 5.1 5.0 5.0  CL 99* 101 99*  CO2 25 25 24   GLUCOSE 167* 172* 177*  BUN 58* 65* 65*  CREATININE 7.51* 6.99* 7.25*  CALCIUM 8.6* 9.2 8.9  PHOS 4.6 5.2*  --    Liver Function Tests: Recent Labs  Lab 03/07/17 0901 03/09/17 0821  ALBUMIN 2.8* 2.7*   No results for input(s): LIPASE, AMYLASE in the last 168 hours. CBC: Recent Labs  Lab 03/06/17 0641 03/07/17 0901 03/09/17 0821 03/10/17 1104 03/11/17 1026  WBC 6.7 7.7 6.6 8.3 8.0  NEUTROABS  --   --  4.5  --   --   HGB 8.6* 8.1* 8.0* 9.5* 8.0*  HCT 27.0* 25.8* 26.1* 30.7* 25.8*  MCV 95.4 95.6 94.9 94.8 95.2  PLT 175 162 163 200 174   Blood Culture    Component Value Date/Time   SDES URINE, CATHETERIZED 02/24/2017 1429   SPECREQUEST NONE 02/24/2017 1429   CULT NO GROWTH 02/24/2017 1429   REPTSTATUS 02/25/2017 FINAL 02/24/2017 1429    Cardiac Enzymes: No results for input(s): CKTOTAL, CKMB, CKMBINDEX, TROPONINI in the last 168 hours. CBG: Recent Labs  Lab 03/10/17 2205 03/11/17 1251 03/11/17 1715 03/11/17 2049 03/12/17 0744  GLUCAP 169* 173* 174* 245* 140*   Iron Studies: No results for input(s): IRON, TIBC, TRANSFERRIN, FERRITIN in the last 72 hours. Lab Results  Component Value Date   INR 1.03 02/11/2017   INR 1.12 02/07/2017   INR 1.29 07/19/2014   Studies/Results: No results found. Medications:  . amLODipine  5 mg Oral Daily  . aspirin EC  81 mg Oral BID  . atorvastatin  40 mg Oral QPM  . chlorhexidine  15 mL Mouth Rinse BID  . docusate sodium  100 mg Oral Daily  . doxercalciferol  3 mcg Intravenous Q M,W,F-HD  . feeding supplement (NEPRO CARB STEADY)  237 mL Oral BID BM  . folic acid  1 mg Oral Daily  . heparin  5,000 Units Subcutaneous Q8H  . insulin aspart  0-9 Units  Subcutaneous TID AC & HS  . lanthanum  1,000 mg Oral TID WC  . levothyroxine  25 mcg Oral QAC breakfast  . mouth rinse  15 mL Mouth Rinse q12n4p  . metoprolol tartrate  25 mg Oral BID  . multivitamin with minerals  1 tablet Oral Daily  . sodium chloride flush  3 mL Intravenous Q12H  . thiamine  100 mg Oral Daily

## 2017-03-12 NOTE — Progress Notes (Signed)
  Family Medicine Teaching Service Daily Progress Note Intern Pager: [phone]  Patient name: Brandi Arellano Medical record number: [id number] Date of birth: [birth year] Age: 61 y.o. Gender: female  Primary Care Provider: Kerin Perna, NP Consultants: nephrology, vascular surgery, urology, psych, cardiology Code Status: partial code (DNI, considering DNR)  [name]:  10/28 admitted to [facility] with AMS likely due to overdose of pain medication 10/29 seen by [facility], permcath replaced, dialysis received, seen by urology 10/30 evaluated by psych 11/20 Cystectomy and TURBT  Assessment and Plan: Brandi Arellano is a 61 y.o. female with PMH of ESRD on MWF HD, T2DM, HLD, HTN, hypothyroidism, CAD, R AKA, and gastroparesis.  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 Patient has an chronically indwelling catheter draining blood and tumor cells. This appear dark and is foul smelling, but is not feculant or infective. Per urology catheter can be removed, but may stay in per patient preference. Urology said no need to change even up until surgery. Patient today says she want catheter to stay in for her comfort. Per urology, patient can be discharged from inpatient. Patient will need transport to surgery from ALF upon discharge.  - Please direct all non-urgent urological questions to Dr. Tresa Moore at [phone]  ESRD: HD MWF - per nephrology   Hypertension: Stable -Continue amlodipine  Type II diabetes: CBG stable.  A1c 6.8 -sSSI -CBG monitoring with meals, qhs  FEN/GI:  -renal/carb modified, miralax -Continue feeding supplement  PPx: heparin  Dispo: Medically stable for discharge, pending placement with HD and ALF  Subjective:  Brandi Arellano denies complaints today. She is questioning whether ALF is the best choice for her. She feels Brandi Arellano is too far away from her family and home. She is interested in  exploring home health. She lives with her brother who is able to take care of her but works M-F during the day. A local sister works third shift.   Objective: Temp:  [97.8 F (36.6 C)-98.9 F (37.2 C)] 98.2 F (36.8 C) (12/01 0546) Pulse Rate:  [57-70] 65 (12/01 0546) Resp:  [17-18] 18 (12/01 0546) BP: (115-179)/(56-89) 122/56 (12/01 0546) SpO2:  [96 %-100 %] 96 % (12/01 0546) Weight:  [117 lb 4.6 oz (53.2 kg)] 117 lb 4.6 oz (53.2 kg) (11/30 1209) Physical Exam: General: NAD,resting sitting in chair CVS: RRR, III/VI murmur Lungs: CTAB, no increased work of breathing Abdomen: Soft, nontender, nondistended MSK: AK on the right, transmetatarsal amputation on the left  Laboratory: Recent Labs  Lab 03/09/17 0821 03/10/17 1104 03/11/17 1026  WBC 6.6 8.3 8.0  HGB 8.0* 9.5* 8.0*  HCT 26.1* 30.7* 25.8*  PLT 163 200 174   Recent Labs  Lab 03/07/17 0901 03/09/17 0821 03/11/17 1026  NA 135 136 134*  K 5.1 5.0 5.0  CL 99* 101 99*  CO2 25 25 24   BUN 58* 65* 65*  CREATININE 7.51* 6.99* 7.25*  CALCIUM 8.6* 9.2 8.9  GLUCOSE 167* 172* 177*   Imaging/Diagnostic Tests: No results found.   Steve Rattler, DO 03/12/2017, 10:31 AM PGY-2, [facility] Intern pager: [phone], text pages welcome

## 2017-03-13 LAB — GLUCOSE, CAPILLARY
GLUCOSE-CAPILLARY: 224 mg/dL — AB (ref 65–99)
GLUCOSE-CAPILLARY: 149 mg/dL — AB (ref 65–99)
Glucose-Capillary: 121 mg/dL — ABNORMAL HIGH (ref 65–99)
Glucose-Capillary: 147 mg/dL — ABNORMAL HIGH (ref 65–99)

## 2017-03-13 NOTE — Progress Notes (Signed)
[facility] Progress Note   Dialysis Orders: [place] MWF 4h 56.5kg 2/2.25 400/auto 1.5 Linear sodium UFP 4 -Hep 4000 R IJ TDC (new one placed 10/29)-  -hect 4 ug tiw -BMM: Fosrenol 1000mg  2 tabs tid  Assessment/Plan: 1. ESRD - onMWF HD. post HD wt 53.2 Friday - next HD Monday first round 2. SCCa of bladder neck w/ invasion - sp cystectomy andTURBT 11/20with rare and aggressive histology - Urology note suggests surgery could be scheduled for early Jan 2019; chronic indwelling foley with brown urine- foul odor 3. Anemia of CKD - HGB 8.0> 9.5>8ESA on hold d/t malignancy. Transfuse prn. 61% sat 02/16/17. checklabs pre HD 4. MBD of CKD - cont vdra/ fosrenol 5. HTN/ vol - no vol excess , cont norvasc/ MTP- net UF 2.5 L 11/30 6. Nutrition:Alb 3.2 ->down to 2.7, continue pro-stat andaddnepro 7. Hx AMS on admission: Thought to be due to pain medications. Resolved 8. Hx possible suicidal ideation. Seen by psych 10/30. No active SI. 9. DM: per primary 10. [facility]: [facility] [place].  It is very unfortunate that we cannot find a local health care facility to accept her. SW notes indicates possible d/c Monday  Myriam Jacobson, [facility] Beeper [phone] 03/13/2017,10:56 AM  LOS: 34 days   Pt seen, examined and agree w A/P as above. Per CM the reason local SNF facilities would not accept her is because of the possibility that she had suicidal intentions on admission.  Psychiatry saw her on Oct 28 and she denied any suicidal intentions and was cleared by them.  [name] MD [employer] [employer] pager [phone]   03/13/2017, 2:05 PM    Subjective:   Nothing new.  Older brother visiting today.  Objective Vitals:   03/12/17 1657 03/12/17 2118 03/13/17 0445 03/13/17 0723  BP: 107/61 (!) 109/57 (!) 145/53 (!) 154/74  Pulse: (!) 56 61 62 62  Resp:      Temp: 98.2 F (36.8 C) 97.9 F (36.6 C) 98.9  F (37.2 C) 98.6 F (37 C)  TempSrc: Oral Oral Oral Oral  SpO2: 98% 97% 94% 93%  Weight:  53.2 kg (117 lb 6 oz)    Height:       Physical Exam General: NAD sitting in chair Heart: RRR + murmur Lungs: no rales Abdomen: soft  GU-Foley catheter - dark urine foul odor Extremities: rkght AKA  Left transmet no edema  Dialysis Access: right IJ  Additional Objective Labs: Basic Metabolic Panel: Recent Labs  Lab 03/07/17 0901 03/09/17 0821 03/11/17 1026  NA 135 136 134*  K 5.1 5.0 5.0  CL 99* 101 99*  CO2 25 25 24   GLUCOSE 167* 172* 177*  BUN 58* 65* 65*  CREATININE 7.51* 6.99* 7.25*  CALCIUM 8.6* 9.2 8.9  PHOS 4.6 5.2*  --    Liver Function Tests: Recent Labs  Lab 03/07/17 0901 03/09/17 0821  ALBUMIN 2.8* 2.7*   No results for input(s): LIPASE, AMYLASE in the last 168 hours. CBC: Recent Labs  Lab 03/07/17 0901 03/09/17 0821 03/10/17 1104 03/11/17 1026  WBC 7.7 6.6 8.3 8.0  NEUTROABS  --  4.5  --   --   HGB 8.1* 8.0* 9.5* 8.0*  HCT 25.8* 26.1* 30.7* 25.8*  MCV 95.6 94.9 94.8 95.2  PLT 162 163 200 174   Blood Culture    Component Value Date/Time   SDES URINE, CATHETERIZED 02/24/2017 1429   SPECREQUEST NONE 02/24/2017 1429   CULT  NO GROWTH 02/24/2017 1429   REPTSTATUS 02/25/2017 FINAL 02/24/2017 1429    Cardiac Enzymes: No results for input(s): CKTOTAL, CKMB, CKMBINDEX, TROPONINI in the last 168 hours. CBG: Recent Labs  Lab 03/12/17 0744 03/12/17 1146 03/12/17 1624 03/12/17 2109 03/13/17 0801  GLUCAP 140* 140* 262* 122* 121*   Iron Studies: No results for input(s): IRON, TIBC, TRANSFERRIN, FERRITIN in the last 72 hours. Lab Results  Component Value Date   INR 1.03 02/11/2017   INR 1.12 02/07/2017   INR 1.29 07/19/2014   Studies/Results: No results found. Medications:  . amLODipine  5 mg Oral Daily  . aspirin EC  81 mg Oral BID  . atorvastatin  40 mg Oral QPM  . chlorhexidine  15 mL Mouth Rinse BID  . docusate sodium  100 mg Oral Daily   . doxercalciferol  3 mcg Intravenous Q M,W,F-HD  . feeding supplement (NEPRO CARB STEADY)  237 mL Oral BID BM  . folic acid  1 mg Oral Daily  . heparin  5,000 Units Subcutaneous Q8H  . insulin aspart  0-9 Units Subcutaneous TID AC & HS  . lanthanum  1,000 mg Oral TID WC  . levothyroxine  25 mcg Oral QAC breakfast  . mouth rinse  15 mL Mouth Rinse q12n4p  . metoprolol tartrate  25 mg Oral BID  . multivitamin with minerals  1 tablet Oral Daily  . sodium chloride flush  3 mL Intravenous Q12H  . thiamine  100 mg Oral Daily

## 2017-03-13 NOTE — Progress Notes (Signed)
  Family Medicine Teaching Service Daily Progress Note Intern Pager: [phone]  Patient name: Brandi Arellano Medical record number: [id number] Date of birth: 1955/08/28 Age: 61 y.o. Gender: female  Primary Care Provider: Kerin Perna, NP Consultants: nephrology, vascular surgery, urology, psych, cardiology Code Status: partial code (DNI, considering DNR)  [name]:  10/28 admitted to [facility] with AMS likely due to overdose of pain medication 10/29 seen by vvs, permcath replaced, dialysis received, seen by urology 10/30 evaluated by psych 11/20 Cystectomy and TURBT  Assessment and Plan: Brandi Arellano is a 61 y.o. female with PMH of ESRD on MWF HD, T2DM, HLD, HTN, hypothyroidism, CAD, R AKA, and gastroparesis.  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 Patient has an chronically indwelling catheter draining blood and tumor cells. This appear dark and is foul smelling, but is not feculant or infective. Per urology catheter can be removed, but may stay in per patient preference. Urology said no need to change even up until surgery. Patient today says she want catheter to stay in for her comfort. Per urology, patient can be discharged from inpatient. Patient will need transport to surgery from SNF upon discharge.  - Please direct all non-urgent urological questions to Dr. Tresa Moore at [phone]  ESRD: HD MWF - per nephrology   Hypertension: Stable -Continue amlodipine  Type II diabetes: CBG stable.  A1c 6.8 -sSSI -CBG monitoring with meals, qhs  FEN/GI:  -renal/carb modified, miralax -Continue feeding supplement  PPx: heparin  Dispo: Medically stable for discharge, pending placement with SNF/HD  Subjective:  [name] reports she didn't sleep well due to noise in the hall and nursing checks. She denies complaints. No family at bedside.   Objective: Temp:  [97.9 F (36.6 C)-98.9 F  (37.2 C)] 98.6 F (37 C) (12/02 0723) Pulse Rate:  [56-62] 62 (12/02 0723) BP: (107-154)/(53-74) 154/74 (12/02 0723) SpO2:  [93 %-98 %] 93 % (12/02 0723) Weight:  [117 lb 6 oz (53.2 kg)] 117 lb 6 oz (53.2 kg) (12/01 2118) Physical Exam: General: NAD,laying in chair CVS: RRR, III/VI murmur Lungs: CTAB, no increased work of breathing Abdomen: Soft, nontender, nondistended MSK: AK on the right, transmetatarsal amputation on the left  Laboratory: Recent Labs  Lab 03/09/17 0821 03/10/17 1104 03/11/17 1026  WBC 6.6 8.3 8.0  HGB 8.0* 9.5* 8.0*  HCT 26.1* 30.7* 25.8*  PLT 163 200 174   Recent Labs  Lab 03/07/17 0901 03/09/17 0821 03/11/17 1026  NA 135 136 134*  K 5.1 5.0 5.0  CL 99* 101 99*  CO2 25 25 24   BUN 58* 65* 65*  CREATININE 7.51* 6.99* 7.25*  CALCIUM 8.6* 9.2 8.9  GLUCOSE 167* 172* 177*   Imaging/Diagnostic Tests: No results found.   Steve Rattler, DO 03/13/2017, 8:44 AM PGY-2, [place] Intern pager: [phone], text pages welcome

## 2017-03-14 LAB — RENAL FUNCTION PANEL
Albumin: 2.9 g/dL — ABNORMAL LOW (ref 3.5–5.0)
Anion gap: 14 (ref 5–15)
BUN: 85 mg/dL — ABNORMAL HIGH (ref 6–20)
CO2: 24 mmol/L (ref 22–32)
Calcium: 9 mg/dL (ref 8.9–10.3)
Chloride: 97 mmol/L — ABNORMAL LOW (ref 101–111)
Creatinine, Ser: 8.39 mg/dL — ABNORMAL HIGH (ref 0.44–1.00)
GFR calc Af Amer: 5 mL/min — ABNORMAL LOW (ref >60)
GFR calc non Af Amer: 5 mL/min — ABNORMAL LOW (ref >60)
Glucose, Bld: 160 mg/dL — ABNORMAL HIGH (ref 65–99)
Phosphorus: 5.3 mg/dL — ABNORMAL HIGH (ref 2.5–4.6)
Potassium: 5.9 mmol/L — ABNORMAL HIGH (ref 3.5–5.1)
Sodium: 135 mmol/L (ref 135–145)

## 2017-03-14 LAB — CBC
HCT: 26 % — ABNORMAL LOW (ref 36.0–46.0)
Hemoglobin: 8.2 g/dL — ABNORMAL LOW (ref 12.0–15.0)
MCH: 30 pg (ref 26.0–34.0)
MCHC: 31.5 g/dL (ref 30.0–36.0)
MCV: 95.2 fL (ref 78.0–100.0)
Platelets: 158 10*3/uL (ref 150–400)
RBC: 2.73 MIL/uL — ABNORMAL LOW (ref 3.87–5.11)
RDW: 18.5 % — ABNORMAL HIGH (ref 11.5–15.5)
WBC: 6.1 10*3/uL (ref 4.0–10.5)

## 2017-03-14 LAB — GLUCOSE, CAPILLARY
GLUCOSE-CAPILLARY: 221 mg/dL — AB (ref 65–99)
GLUCOSE-CAPILLARY: 88 mg/dL (ref 65–99)
GLUCOSE-CAPILLARY: 295 mg/dL — AB (ref 65–99)
GLUCOSE-CAPILLARY: 179 mg/dL — AB (ref 65–99)

## 2017-03-14 MED ORDER — DOXERCALCIFEROL 4 MCG/2ML IV SOLN
INTRAVENOUS | Status: AC
  Administered 2017-03-14: 3 ug via INTRAVENOUS
  Filled 2017-03-14: qty 2

## 2017-03-14 NOTE — Progress Notes (Signed)
  Family Medicine Teaching Service Daily Progress Note Intern Pager: [phone]  Patient name: Brandi Arellano Medical record number: [id number] Date of birth: 1955/06/05 Age: 61 y.o. Gender: female  Primary Care Provider: Kerin Perna, NP Consultants: nephrology, vascular surgery, urology, psych, cardiology Code Status: partial code (DNI, considering DNR)  [name]:  10/28 admitted to [facility] with AMS likely due to overdose of pain medication 10/29 seen by vvs, permcath replaced, dialysis received, seen by urology 10/30 evaluated by psych 11/20 Cystectomy and TURBT  Assessment and Plan: Brandi Arellano is a 61 y.o. female with PMH of ESRD on MWF HD, T2DM, HLD, HTN, hypothyroidism, CAD, R AKA, and gastroparesis.  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 Patient has an chronically indwelling catheter draining blood and tumor cells. This appear dark and is foul smelling, but is not feculant or infective. Per urology catheter can be removed, but may stay in per patient preference. Urology said no need to change even up until surgery. Patient today says she want catheter to stay in for her comfort. Per urology, patient can be discharged from inpatient. Patient will need transport to surgery from SNF upon discharge.  - Please direct all non-urgent urological questions to Dr. Tresa Moore at [phone]  ESRD: HD MWF, in HD today - per nephrology   Hypertension: Stable -Continue amlodipine  Type II diabetes: CBG stable.  A1c 6.8 -sSSI -CBG monitoring with meals, qhs  FEN/GI:  -renal/carb modified, miralax -Continue feeding supplement  PPx: heparin  Dispo: Medically stable for discharge, pending placement with SNF/HD  Subjective:  Brandi Arellano is in HD. She states that Brandi Arellano is too far, but she doesn't know if she feels safe going back to her home. She has no complaints.  Objective: Temp:   [97.9 F (36.6 C)-98.3 F (36.8 C)] 98.2 F (36.8 C) (12/03 1252) Pulse Rate:  [53-89] 69 (12/03 1252) Resp:  [18-19] 18 (12/03 1252) BP: (111-184)/(55-119) 173/72 (12/03 1252) SpO2:  [91 %-98 %] 95 % (12/03 1252) Weight:  [116 lb 6.5 oz (52.8 kg)-127 lb 10.3 oz (57.9 kg)] 122 lb 9.2 oz (55.6 kg) (12/03 1152) Physical Exam: General: NAD,in HD CVS: RRR, III/VI murmur Lungs: CTAB, no increased work of breathing Abdomen: Soft, nontender, nondistended MSK: AK on the right, transmetatarsal amputation on the left  Laboratory: Recent Labs  Lab 03/10/17 1104 03/11/17 1026 03/14/17 0730  WBC 8.3 8.0 6.1  HGB 9.5* 8.0* 8.2*  HCT 30.7* 25.8* 26.0*  PLT 200 174 158   Recent Labs  Lab 03/09/17 0821 03/11/17 1026 03/14/17 0730  NA 136 134* 135  K 5.0 5.0 5.9*  CL 101 99* 97*  CO2 25 24 24   BUN 65* 65* 85*  CREATININE 6.99* 7.25* 8.39*  CALCIUM 9.2 8.9 9.0  GLUCOSE 172* 177* 160*   Imaging/Diagnostic Tests: No results found.   Brandi Arellano, [place], DO 03/14/2017, 3:15 PM PGY-1, [place] Intern pager: [phone], text pages welcome

## 2017-03-14 NOTE — Progress Notes (Signed)
  [facility] Progress Note   Assessment/ Plan:   1. ESRD - onMWF HD.post HD wt 53.2 Friday - on normal schedule 2. SCCa of bladder neck w/ invasion - sp cystectomy andTURBT 11/20with rare and aggressive histology - Urology note suggests surgery could be scheduled for early Jan 2019; chronic indwelling foley with cloudy urine- foul odor 3. Anemia of CKD - HGB 8.0>9.5>8ESA on hold d/t malignancy. Transfuse prn. 61% sat 02/16/17. checklabs pre HD 4. MBD of CKD - cont vdra/ fosrenol 5. HTN/ vol - no vol excess , cont norvasc/ MTP- net UF 2.5 L11/30 6. Nutrition:Alb 3.2 ->down to 2.7, continue pro-stat andaddnepro 7. Hx AMS on admission: Thought to be due to pain medications. Resolved 8. Hx possible suicidal ideation. Seen by psych 10/30 and eventually cleared per notes No active SI. 9. DM: per primary 10. Dis[facility]: [facility] [place].  It is very unfortunate that we cannot find a local health care facility to accept her.SW notes indicates possible d/c Monday (? Today but will see)   Subjective:    No acute events.  Reports some irritation from Foley.    Objective:   BP (!) 117/55 (BP Location: Left Arm)   Pulse 61   Temp 98.1 F (36.7 C) (Oral)   Resp 18   Ht 5\' 6"  (1.676 m)   Wt 57.9 kg (127 lb 10.3 oz)   SpO2 91%   BMI 20.60 kg/m   Physical Exam: General: NAD sitting in chair Heart: RRR + murmur Lungs: no rales Abdomen: soft  GU-Foley catheter - dark urine foul odor Extremities: rkght AKA  Left transmet no edema  Dialysis Access: right IJ    Labs: BMET Recent Labs  Lab 03/09/17 0821 03/11/17 1026 03/14/17 0730  NA 136 134* 135  K 5.0 5.0 5.9*  CL 101 99* 97*  CO2 25 24 24   GLUCOSE 172* 177* 160*  BUN 65* 65* 85*  CREATININE 6.99* 7.25* 8.39*  CALCIUM 9.2 8.9 9.0  PHOS 5.2*  --  5.3*   CBC Recent Labs  Lab 03/09/17 0821 03/10/17 1104 03/11/17 1026 03/14/17 0730  WBC 6.6 8.3 8.0 6.1  NEUTROABS  4.5  --   --   --   HGB 8.0* 9.5* 8.0* 8.2*  HCT 26.1* 30.7* 25.8* 26.0*  MCV 94.9 94.8 95.2 95.2  PLT 163 200 174 158    @IMGRELPRIORS @ Medications:    . doxercalciferol      . amLODipine  5 mg Oral Daily  . aspirin EC  81 mg Oral BID  . atorvastatin  40 mg Oral QPM  . chlorhexidine  15 mL Mouth Rinse BID  . docusate sodium  100 mg Oral Daily  . doxercalciferol  3 mcg Intravenous Q M,W,F-HD  . feeding supplement (NEPRO CARB STEADY)  237 mL Oral BID BM  . folic acid  1 mg Oral Daily  . heparin  5,000 Units Subcutaneous Q8H  . insulin aspart  0-9 Units Subcutaneous TID AC & HS  . lanthanum  1,000 mg Oral TID WC  . levothyroxine  25 mcg Oral QAC breakfast  . mouth rinse  15 mL Mouth Rinse q12n4p  . metoprolol tartrate  25 mg Oral BID  . multivitamin with minerals  1 tablet Oral Daily  . sodium chloride flush  3 mL Intravenous Q12H  . thiamine  100 mg Oral Daily     Madelon Lips MD [facility] pgr [phone] 03/14/2017, 9:58 AM

## 2017-03-14 NOTE — Clinical Social Work Note (Signed)
Call made to [facility] skilled nursing facility (3:10 pm) and message left for admissions director Suanne Marker to call CSW regarding patient. Efforts continue regarding getting HD set up at a dialysis center in [place].  Semone Orlov Givens, MSW, LCSW Licensed Clinical Social Worker [place] [phone]

## 2017-03-14 NOTE — Care Management Note (Signed)
Case Management Note  Patient Details  Name: Brandi Arellano MRN: [id number] Date of Birth: Aug 16, 1955  Subjective/Objective:       CM following for progression and d/c planning.              Action/Plan: 03/14/2017 Spoke with Karmen Bongo coordinator, [name] on Friday 03/11/17 re [name] placement. As they have had required information from [facility] since 03/07/2017 and all info from [facility] since 03/09/2017. Per [name] they are awaiting [facility] approval and MD review at that facility.  [name] stated that she will contact [facility] again on 03/11/2017. 03/14/2017 Still no response re placement of this pt at [facility] for ongoing outpatient HD. Call placed to [name] again and this CM requested update re placement at [facility].    Expected Discharge Date:                  Expected Discharge Plan:  Skilled Nursing Facility  In-House Referral:  Clinical Social Work  Discharge planning Services  CM Consult  Post Acute Care Choice:  NA Choice offered to:  NA  DME Arranged:  N/A DME Agency:  NA  HH Arranged:  NA HH Agency:  NA  Status of Service:  Completed, signed off  If discussed at [place] of Stay Meetings, dates discussed:    Additional Comments:  Adron Bene, RN 03/14/2017, 3:47 PM

## 2017-03-15 LAB — GLUCOSE, CAPILLARY
GLUCOSE-CAPILLARY: 161 mg/dL — AB (ref 65–99)
Glucose-Capillary: 259 mg/dL — ABNORMAL HIGH (ref 65–99)

## 2017-03-15 MED ORDER — THIAMINE HCL 100 MG PO TABS: 100.0000 mg | ORAL_TABLET | Freq: Every day | ORAL | 0 refills | Status: AC

## 2017-03-15 MED ORDER — METOPROLOL TARTRATE 25 MG PO TABS: 25.0000 mg | ORAL_TABLET | Freq: Two times a day (BID) | ORAL | 0 refills | Status: DC

## 2017-03-15 MED ORDER — SENNOSIDES-DOCUSATE SODIUM 8.6-50 MG PO TABS: 1.0000 | ORAL_TABLET | Freq: Every evening | ORAL | 0 refills | Status: AC | PRN

## 2017-03-15 MED ORDER — AMLODIPINE BESYLATE 5 MG PO TABS: 5.0000 mg | ORAL_TABLET | Freq: Every day | ORAL | Status: DC

## 2017-03-15 NOTE — Care Management Note (Signed)
Case Management Note  Patient Details  Name: Brandi Arellano MRN: [id number] Date of Birth: Aug 11, 1955  Subjective/Objective:         CM following for progression and d/c planning.            Action/Plan: 03/15/2017 Notified this am that this pt wishes to d/c to home. CSW  Crawford Givens in touch with pt brother who will be coming to the hospital. Brother with multiple questions re pt care and possible surgery in January. CSW informed brother that all questions will be answered with the d/c instructions.  Per Freda Munro, HD unit secretary was able to inform pt that she still has a seat at [facility] at the same time so that she may return for ongoing HD treatment beginning tomorrow , Wednesday, Mar 16, 2017.    Expected Discharge Date:                  Expected Discharge Plan:  [place]  In-House Referral:  Clinical Social Work  Discharge planning Services  CM Consult  Post Acute Care Choice:  NA Choice offered to:  Patient  DME Arranged:  N/A DME Agency:  NA  HH Arranged:  RN Standard Agency:  NA  Status of Service:  In process, will continue to follow  If discussed at Long Length of Stay Meetings, dates discussed:    Additional Comments:  Adron Bene, RN 03/15/2017, 11:17 AM

## 2017-03-15 NOTE — Progress Notes (Signed)
Subjective:  tolerated hd yest. No cos  Objective Vital signs in last 24 hours: Vitals:   03/14/17 1735 03/14/17 2119 03/15/17 0535 03/15/17 0854  BP: (!) 166/57 138/77 132/70 (!) 187/75  Pulse: 64 69 64 64  Resp: 18 18 18 18   Temp: 98.2 F (36.8 C) 98.7 F (37.1 C) 98.5 F (36.9 C) 97.8 F (36.6 C)  TempSrc: Oral Oral Oral Oral  SpO2: 96% 95% 94% 94%  Weight:      Height:       Weight change: 5.1 kg (11 lb 3.9 oz)  Physical Exam: General: alert ,NAD sitting in chair Heart:RRR + murmur Lungs:CTA  ,no rales Abdomen: BS pos , Nt, ND soft  GU-Foley catheter - dark urine foul odor Extremities:rkght AKA Left transmet no edema  Dialysis Access:right IJ perm cath   Dialysis Orders: [place] M[facility] 4h 56.5kg 2/2.25 400/auto 1.5 Linear sodium UFP 4 -Hep 4000 R IJ TDC (new one placed 10/29)-  -hect 4 ug tiw -BMM: Fosrenol 1000mg  2 tabs tid   Problem/Plan: 1.  ESRD - onMWF HD.post HD wt 55.6  yest  - on normal schedule 2. SCCa of bladder neck w/ invasion - sp cystectomy andTURBT 11/20with rare and aggressive histology - Urology note suggests surgery could be scheduled for early Jan 2019; chronic indwelling foley with cloudy urine- foul odor 3. Anemia of CKD - HGB 8.0>9.5>8.2ESA on hold d/t malignancy. Transfuse prn. 61% sat 02/16/17. checklabs pre HD in am if here  4. MBD of CKD - cont vdra/ fosrenol phos 5.3  5. HTN/ vol - no vol excess , cont norvasc 5mg  qd make hs sec .to hd / MTP 25mg  bid - net UF 1975 yest.  Sl below edw now 55.6  6. Nutrition:Alb 3.2 ->down to 2.7>2.9  continue pro-stat andaddnepro 7. Hx AMS on admission:  Has resolved ,Thought to be due to pain medications.  8. Hx possible suicidal ideation. Seen by psych 10/30 and eventually cleared per notes No active SI. 9. DM: per primary 10. Dispo: NOW  Going back to live Brother in [place]  , [facility] will be HD center  [place], PA-C [facility] Beeper  [phone] 03/15/2017,10:13 AM  LOS: 36 days   Labs: Basic Metabolic Panel: Recent Labs  Lab 03/09/17 0821 03/11/17 1026 03/14/17 0730  NA 136 134* 135  K 5.0 5.0 5.9*  CL 101 99* 97*  CO2 25 24 24   GLUCOSE 172* 177* 160*  BUN 65* 65* 85*  CREATININE 6.99* 7.25* 8.39*  CALCIUM 9.2 8.9 9.0  PHOS 5.2*  --  5.3*   Liver Function Tests: Recent Labs  Lab 03/09/17 0821 03/14/17 0730  ALBUMIN 2.7* 2.9*   No results for input(s): LIPASE, AMYLASE in the last 168 hours. No results for input(s): AMMONIA in the last 168 hours. CBC: Recent Labs  Lab 03/09/17 0821 03/10/17 1104 03/11/17 1026 03/14/17 0730  WBC 6.6 8.3 8.0 6.1  NEUTROABS 4.5  --   --   --   HGB 8.0* 9.5* 8.0* 8.2*  HCT 26.1* 30.7* 25.8* 26.0*  MCV 94.9 94.8 95.2 95.2  PLT 163 200 174 158   Cardiac Enzymes: No results for input(s): CKTOTAL, CKMB, CKMBINDEX, TROPONINI in the last 168 hours. CBG: Recent Labs  Lab 03/13/17 2136 03/14/17 1250 03/14/17 1635 03/14/17 2116 03/15/17 0739  GLUCAP 149* 88 295* 179* 161*    Studies/Results: No results found. Medications:  . amLODipine  5 mg Oral Daily  . aspirin  EC  81 mg Oral BID  . atorvastatin  40 mg Oral QPM  . chlorhexidine  15 mL Mouth Rinse BID  . docusate sodium  100 mg Oral Daily  . doxercalciferol  3 mcg Intravenous Q M,W,F-HD  . feeding supplement (NEPRO CARB STEADY)  237 mL Oral BID BM  . folic acid  1 mg Oral Daily  . heparin  5,000 Units Subcutaneous Q8H  . insulin aspart  0-9 Units Subcutaneous TID AC & HS  . lanthanum  1,000 mg Oral TID WC  . levothyroxine  25 mcg Oral QAC breakfast  . mouth rinse  15 mL Mouth Rinse q12n4p  . metoprolol tartrate  25 mg Oral BID  . multivitamin with minerals  1 tablet Oral Daily  . sodium chloride flush  3 mL Intravenous Q12H  . thiamine  100 mg Oral Daily

## 2017-03-15 NOTE — Progress Notes (Signed)
Patient being discharged home. Her brother is going to help take care of her according to patient. He is on the way to get her. Discharge papers given to the patient.

## 2017-03-15 NOTE — Progress Notes (Signed)
  Family Medicine Teaching Service Daily Progress Note Intern Pager: [phone]  Patient name: Brandi Arellano Medical record number: [id number] Date of birth: 03-21-1956 Age: 61 y.o. Gender: female  Primary Care Provider: Kerin Perna, NP Consultants: nephrology, vascular surgery, urology, psych, cardiology Code Status: partial code (DNI, considering DNR)  [name]:  10/28 admitted to [facility] with AMS likely due to overdose of pain medication 10/29 seen by vvs, permcath replaced, dialysis received, seen by urology 10/30 evaluated by psych 11/20 Cystectomy and TURBT  Assessment and Plan: Brandi Arellano is a 61 y.o. female with PMH of ESRD on MWF HD, T2DM, HLD, HTN, hypothyroidism, CAD, R AKA, and gastroparesis.  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 Patient has a chronically indwelling catheter draining blood and tumor cells. This appear dark and is foul smelling, but is not feculant or infective. Per urology catheter can be removed, but may stay in per patient preference. Urology said no need to change even up until surgery. Patient today says she want catheter to stay in for her comfort. Per urology, patient can be discharged from inpatient.  - Please direct all non-urgent urological questions to Dr. Tresa Moore at [phone]  ESRD: HD MWF - per nephrology   Hypertension: Stable -Continue amlodipine  Type II diabetes: CBG stable.  A1c 6.8 -sSSI -CBG monitoring with meals, qhs  FEN/GI:  -renal/carb modified, miralax -Continue feeding supplement  PPx: heparin  Dispo: Medically stable for discharge to home with brother  Subjective:  Brandi Arellano says she would like to go home to her brother's and she is ready. She wants to go home for a while before her surgery.   Objective: Temp:  [97.8 F (36.6 C)-98.7 F (37.1 C)] 97.8 F (36.6 C) (12/04 0854) Pulse Rate:  [64-79] 64 (12/04  0854) Resp:  [18] 18 (12/04 0854) BP: (132-187)/(57-90) 187/75 (12/04 0854) SpO2:  [93 %-96 %] 94 % (12/04 0854) Weight:  [122 lb 9.2 oz (55.6 kg)] 122 lb 9.2 oz (55.6 kg) (12/03 1152) Physical Exam: General: NAD, sitting up in bed, talkative CVS: RRR, III/VI murmur Lungs: CTAB, no increased work of breathing Abdomen: Soft, nontender, nondistended MSK: AK on the right, transmetatarsal amputation on the left  Laboratory: Recent Labs  Lab 03/10/17 1104 03/11/17 1026 03/14/17 0730  WBC 8.3 8.0 6.1  HGB 9.5* 8.0* 8.2*  HCT 30.7* 25.8* 26.0*  PLT 200 174 158   Recent Labs  Lab 03/09/17 0821 03/11/17 1026 03/14/17 0730  NA 136 134* 135  K 5.0 5.0 5.9*  CL 101 99* 97*  CO2 25 24 24   BUN 65* 65* 85*  CREATININE 6.99* 7.25* 8.39*  CALCIUM 9.2 8.9 9.0  GLUCOSE 172* 177* 160*   Imaging/Diagnostic Tests: No results found.   Brandi Arellano, [place], DO 03/15/2017, 9:36 AM PGY-1, [place] Intern pager: [phone], text pages welcome

## 2017-03-15 NOTE — Care Management Note (Addendum)
Case Management Note  Patient Details  Name: Brandi Arellano MRN: [id number] Date of Birth: [birth year]  Subjective/Objective:    CM following for progression and d/c planning.                 Action/Plan: 03/15/2017 Pt for d/c to home today, noted [place] orders for HHPT/HHOT/HHRN and [place] aide. This CM notified MD that this pt has not required PT or OT since 02/15/17 and no skilled need for [facility] has been identified. Pt insurance will not cover HHPT or HHOT as the pt does not qualify under Medicaid guidelines. Await MD response.  Pt seems some what confused this CM placed a call to the pt brother and reviewed pt plan.  pt has stated several times that she will call her brother however we are unsure that she has actually done this Pt RN will go over d/c info with pt and family when pt is d/c.  Expected Discharge Date:  03/15/17               Expected Discharge Plan:  [place]  In-House Referral:  Clinical Social Work  Discharge planning Services  CM Consult  Post Acute Care Choice:  NA Choice offered to:  Patient  DME Arranged:  N/A DME Agency:  NA  HH Arranged:  RN [place] Agency:  NA  Status of Service:  In process, will continue to follow  If discussed at Long Length of Stay Meetings, dates discussed:    Additional Comments:  Adron Bene, RN 03/15/2017, 3:04 PM

## 2017-04-04 ENCOUNTER — Inpatient Hospital Stay (HOSPITAL_COMMUNITY): Payer: Medicaid Other

## 2017-04-04 ENCOUNTER — Emergency Department (HOSPITAL_COMMUNITY): Payer: Medicaid Other

## 2017-04-04 ENCOUNTER — Inpatient Hospital Stay (HOSPITAL_COMMUNITY)
Admission: EM | Admit: 2017-04-04 | Discharge: 2017-04-11 | DRG: 246 | Disposition: A | Discharge status: Home / Self Care | Payer: Medicaid Other | Attending: Family Medicine | Admitting: Family Medicine

## 2017-04-04 ENCOUNTER — Encounter (HOSPITAL_COMMUNITY): Payer: Self-pay | Admitting: Emergency Medicine

## 2017-04-04 DIAGNOSIS — Z794 Long term (current) use of insulin: Secondary | ICD-10-CM | POA: Diagnosis not present

## 2017-04-04 DIAGNOSIS — N2581 Secondary hyperparathyroidism of renal origin: Secondary | ICD-10-CM | POA: Diagnosis present

## 2017-04-04 DIAGNOSIS — Z992 Dependence on renal dialysis: Secondary | ICD-10-CM | POA: Diagnosis not present

## 2017-04-04 DIAGNOSIS — Z87891 Personal history of nicotine dependence: Secondary | ICD-10-CM

## 2017-04-04 DIAGNOSIS — I350 Nonrheumatic aortic (valve) stenosis: Secondary | ICD-10-CM | POA: Diagnosis present

## 2017-04-04 DIAGNOSIS — I214 Non-ST elevation (NSTEMI) myocardial infarction: Secondary | ICD-10-CM | POA: Diagnosis not present

## 2017-04-04 DIAGNOSIS — I447 Left bundle-branch block, unspecified: Secondary | ICD-10-CM | POA: Diagnosis present

## 2017-04-04 DIAGNOSIS — R0902 Hypoxemia: Secondary | ICD-10-CM | POA: Diagnosis present

## 2017-04-04 DIAGNOSIS — N390 Urinary tract infection, site not specified: Secondary | ICD-10-CM | POA: Diagnosis present

## 2017-04-04 DIAGNOSIS — R748 Abnormal levels of other serum enzymes: Secondary | ICD-10-CM | POA: Diagnosis not present

## 2017-04-04 DIAGNOSIS — K219 Gastro-esophageal reflux disease without esophagitis: Secondary | ICD-10-CM | POA: Diagnosis present

## 2017-04-04 DIAGNOSIS — E8889 Other specified metabolic disorders: Secondary | ICD-10-CM | POA: Diagnosis present

## 2017-04-04 DIAGNOSIS — R7989 Other specified abnormal findings of blood chemistry: Secondary | ICD-10-CM

## 2017-04-04 DIAGNOSIS — R778 Other specified abnormalities of plasma proteins: Secondary | ICD-10-CM

## 2017-04-04 DIAGNOSIS — Z79899 Other long term (current) drug therapy: Secondary | ICD-10-CM

## 2017-04-04 DIAGNOSIS — R112 Nausea with vomiting, unspecified: Secondary | ICD-10-CM | POA: Diagnosis present

## 2017-04-04 DIAGNOSIS — Z955 Presence of coronary angioplasty implant and graft: Secondary | ICD-10-CM

## 2017-04-04 DIAGNOSIS — I953 Hypotension of hemodialysis: Secondary | ICD-10-CM | POA: Diagnosis not present

## 2017-04-04 DIAGNOSIS — E039 Hypothyroidism, unspecified: Secondary | ICD-10-CM | POA: Diagnosis present

## 2017-04-04 DIAGNOSIS — Z86718 Personal history of other venous thrombosis and embolism: Secondary | ICD-10-CM

## 2017-04-04 DIAGNOSIS — D631 Anemia in chronic kidney disease: Secondary | ICD-10-CM | POA: Diagnosis present

## 2017-04-04 DIAGNOSIS — I132 Hypertensive heart and chronic kidney disease with heart failure and with stage 5 chronic kidney disease, or end stage renal disease: Secondary | ICD-10-CM | POA: Diagnosis present

## 2017-04-04 DIAGNOSIS — I2582 Chronic total occlusion of coronary artery: Secondary | ICD-10-CM | POA: Diagnosis present

## 2017-04-04 DIAGNOSIS — I21A1 Myocardial infarction type 2: Secondary | ICD-10-CM | POA: Diagnosis not present

## 2017-04-04 DIAGNOSIS — Z7982 Long term (current) use of aspirin: Secondary | ICD-10-CM

## 2017-04-04 DIAGNOSIS — N186 End stage renal disease: Secondary | ICD-10-CM

## 2017-04-04 DIAGNOSIS — I248 Other forms of acute ischemic heart disease: Secondary | ICD-10-CM | POA: Diagnosis present

## 2017-04-04 DIAGNOSIS — R109 Unspecified abdominal pain: Secondary | ICD-10-CM

## 2017-04-04 DIAGNOSIS — C679 Malignant neoplasm of bladder, unspecified: Secondary | ICD-10-CM | POA: Diagnosis present

## 2017-04-04 DIAGNOSIS — I509 Heart failure, unspecified: Secondary | ICD-10-CM | POA: Diagnosis present

## 2017-04-04 DIAGNOSIS — I2510 Atherosclerotic heart disease of native coronary artery without angina pectoris: Secondary | ICD-10-CM | POA: Diagnosis present

## 2017-04-04 DIAGNOSIS — I2584 Coronary atherosclerosis due to calcified coronary lesion: Secondary | ICD-10-CM | POA: Diagnosis present

## 2017-04-04 DIAGNOSIS — Z951 Presence of aortocoronary bypass graft: Secondary | ICD-10-CM

## 2017-04-04 DIAGNOSIS — B952 Enterococcus as the cause of diseases classified elsewhere: Secondary | ICD-10-CM | POA: Diagnosis present

## 2017-04-04 DIAGNOSIS — K5900 Constipation, unspecified: Secondary | ICD-10-CM | POA: Diagnosis present

## 2017-04-04 DIAGNOSIS — Z885 Allergy status to narcotic agent status: Secondary | ICD-10-CM

## 2017-04-04 DIAGNOSIS — Z89511 Acquired absence of right leg below knee: Secondary | ICD-10-CM

## 2017-04-04 DIAGNOSIS — I4581 Long QT syndrome: Secondary | ICD-10-CM | POA: Diagnosis present

## 2017-04-04 DIAGNOSIS — I700 Atherosclerosis of aorta: Secondary | ICD-10-CM | POA: Diagnosis present

## 2017-04-04 DIAGNOSIS — E1165 Type 2 diabetes mellitus with hyperglycemia: Secondary | ICD-10-CM | POA: Diagnosis present

## 2017-04-04 DIAGNOSIS — F329 Major depressive disorder, single episode, unspecified: Secondary | ICD-10-CM | POA: Diagnosis present

## 2017-04-04 DIAGNOSIS — E1122 Type 2 diabetes mellitus with diabetic chronic kidney disease: Secondary | ICD-10-CM

## 2017-04-04 DIAGNOSIS — E785 Hyperlipidemia, unspecified: Secondary | ICD-10-CM | POA: Diagnosis present

## 2017-04-04 DIAGNOSIS — Z8673 Personal history of transient ischemic attack (TIA), and cerebral infarction without residual deficits: Secondary | ICD-10-CM

## 2017-04-04 DIAGNOSIS — R1013 Epigastric pain: Secondary | ICD-10-CM | POA: Diagnosis not present

## 2017-04-04 DIAGNOSIS — T83511A Infection and inflammatory reaction due to indwelling urethral catheter, initial encounter: Secondary | ICD-10-CM

## 2017-04-04 DIAGNOSIS — Z888 Allergy status to other drugs, medicaments and biological substances status: Secondary | ICD-10-CM

## 2017-04-04 LAB — CBC
HCT: 33.5 % — ABNORMAL LOW (ref 36.0–46.0)
HCT: 31.8 % — ABNORMAL LOW (ref 36.0–46.0)
HEMATOCRIT: 30.4 % — AB (ref 36.0–46.0)
HEMOGLOBIN: 9.7 g/dL — AB (ref 12.0–15.0)
Hemoglobin: 10.5 g/dL — ABNORMAL LOW (ref 12.0–15.0)
Hemoglobin: 9.9 g/dL — ABNORMAL LOW (ref 12.0–15.0)
MCH: 30.4 pg (ref 26.0–34.0)
MCH: 30.2 pg (ref 26.0–34.0)
MCH: 29.9 pg (ref 26.0–34.0)
MCHC: 31.9 g/dL (ref 30.0–36.0)
MCHC: 31.3 g/dL (ref 30.0–36.0)
MCHC: 31.1 g/dL (ref 30.0–36.0)
MCV: 95.3 fL (ref 78.0–100.0)
MCV: 96.3 fL (ref 78.0–100.0)
MCV: 96.1 fL (ref 78.0–100.0)
Platelets: 167 10*3/uL (ref 150–400)
Platelets: 164 10*3/uL (ref 150–400)
Platelets: 187 10*3/uL (ref 150–400)
RBC: 3.19 MIL/uL — ABNORMAL LOW (ref 3.87–5.11)
RBC: 3.48 MIL/uL — ABNORMAL LOW (ref 3.87–5.11)
RBC: 3.31 MIL/uL — ABNORMAL LOW (ref 3.87–5.11)
RDW: 17.9 % — ABNORMAL HIGH (ref 11.5–15.5)
RDW: 18.1 % — ABNORMAL HIGH (ref 11.5–15.5)
RDW: 18 % — ABNORMAL HIGH (ref 11.5–15.5)
WBC: 11.3 10*3/uL — ABNORMAL HIGH (ref 4.0–10.5)
WBC: 11.1 10*3/uL — ABNORMAL HIGH (ref 4.0–10.5)
WBC: 11.4 10*3/uL — ABNORMAL HIGH (ref 4.0–10.5)

## 2017-04-04 LAB — TROPONIN I

## 2017-04-04 LAB — RENAL FUNCTION PANEL
Albumin: 3.2 g/dL — ABNORMAL LOW (ref 3.5–5.0)
Anion gap: 14 (ref 5–15)
BUN: 37 mg/dL — ABNORMAL HIGH (ref 6–20)
CO2: 26 mmol/L (ref 22–32)
Calcium: 9.3 mg/dL (ref 8.9–10.3)
Chloride: 98 mmol/L — ABNORMAL LOW (ref 101–111)
Creatinine, Ser: 5.31 mg/dL — ABNORMAL HIGH (ref 0.44–1.00)
GFR calc Af Amer: 9 mL/min — ABNORMAL LOW (ref >60)
GFR calc non Af Amer: 8 mL/min — ABNORMAL LOW (ref >60)
Glucose, Bld: 257 mg/dL — ABNORMAL HIGH (ref 65–99)
Phosphorus: 6.8 mg/dL — ABNORMAL HIGH (ref 2.5–4.6)
Potassium: 4.2 mmol/L (ref 3.5–5.1)
Sodium: 138 mmol/L (ref 135–145)

## 2017-04-04 LAB — I-STAT CHEM 8, ED
BUN: 37 mg/dL — ABNORMAL HIGH (ref 6–20)
Calcium, Ion: 1.13 mmol/L — ABNORMAL LOW (ref 1.15–1.40)
Chloride: 100 mmol/L — ABNORMAL LOW (ref 101–111)
Creatinine, Ser: 5.1 mg/dL — ABNORMAL HIGH (ref 0.44–1.00)
GLUCOSE: 294 mg/dL — AB (ref 65–99)
HCT: 32 % — ABNORMAL LOW (ref 36.0–46.0)
HEMOGLOBIN: 10.9 g/dL — AB (ref 12.0–15.0)
POTASSIUM: 4.1 mmol/L (ref 3.5–5.1)
Sodium: 139 mmol/L (ref 135–145)
TCO2: 28 mmol/L (ref 22–32)

## 2017-04-04 LAB — URINALYSIS, ROUTINE W REFLEX MICROSCOPIC
Bilirubin Urine: NEGATIVE
Glucose, UA: >=500 mg/dL — AB
Ketones, ur: NEGATIVE mg/dL
Nitrite: NEGATIVE
PROTEIN: 100 mg/dL — AB
SPECIFIC GRAVITY, URINE: 1.006 (ref 1.005–1.030)
pH: 8 (ref 5.0–8.0)

## 2017-04-04 LAB — COMPREHENSIVE METABOLIC PANEL
ALK PHOS: 75 U/L (ref 38–126)
ALT: 11 U/L — ABNORMAL LOW (ref 14–54)
ANION GAP: 14 (ref 5–15)
AST: 42 U/L — ABNORMAL HIGH (ref 15–41)
Albumin: 3.5 g/dL (ref 3.5–5.0)
BILIRUBIN TOTAL: 0.6 mg/dL (ref 0.3–1.2)
BUN: 33 mg/dL — ABNORMAL HIGH (ref 6–20)
CALCIUM: 9.7 mg/dL (ref 8.9–10.3)
CO2: 27 mmol/L (ref 22–32)
CREATININE: 5.1 mg/dL — AB (ref 0.44–1.00)
Chloride: 99 mmol/L — ABNORMAL LOW (ref 101–111)
GFR calc non Af Amer: 8 mL/min — ABNORMAL LOW (ref >60)
GFR, EST AFRICAN AMERICAN: 10 mL/min — AB (ref >60)
GLUCOSE: 281 mg/dL — AB (ref 65–99)
Potassium: 4.1 mmol/L (ref 3.5–5.1)
Sodium: 140 mmol/L (ref 135–145)
TOTAL PROTEIN: 8.2 g/dL — AB (ref 6.5–8.1)

## 2017-04-04 LAB — I-STAT TROPONIN, ED
TROPONIN I, POC: 8.52 ng/mL — AB (ref 0.00–0.08)
Troponin i, poc: >30 ng/mL (ref 0.00–0.08)

## 2017-04-04 LAB — I-STAT CG4 LACTIC ACID, ED
LACTIC ACID, VENOUS: 1.8 mmol/L (ref 0.5–1.9)
LACTIC ACID, VENOUS: 1.48 mmol/L (ref 0.5–1.9)

## 2017-04-04 LAB — MRSA PCR SCREENING: MRSA by PCR: NEGATIVE

## 2017-04-04 LAB — GLUCOSE, CAPILLARY: GLUCOSE-CAPILLARY: 232 mg/dL — AB (ref 65–99)

## 2017-04-04 MED ORDER — OXYCODONE HCL 5 MG PO TABS
ORAL_TABLET | ORAL | Status: AC
  Administered 2017-04-04: 10 mg via ORAL
  Filled 2017-04-04: qty 2

## 2017-04-04 MED ORDER — AMLODIPINE BESYLATE 5 MG PO TABS
5.0000 mg | ORAL_TABLET | Freq: Every day | ORAL | Status: DC
  Administered 2017-04-04: 5 mg via ORAL

## 2017-04-04 MED ORDER — ONDANSETRON HCL 4 MG/2ML IJ SOLN
INTRAMUSCULAR | Status: AC
  Administered 2017-04-04: 4 mg via INTRAVENOUS
  Filled 2017-04-04: qty 2

## 2017-04-04 MED ORDER — AMLODIPINE BESYLATE 5 MG PO TABS: 5.0000 mg | ORAL_TABLET | Freq: Every day | ORAL | Status: DC

## 2017-04-04 MED ORDER — HEPARIN SODIUM (PORCINE) 1000 UNIT/ML DIALYSIS
1000.0000 [IU] | INTRAMUSCULAR | Status: DC | PRN
  Filled 2017-04-04: qty 1

## 2017-04-04 MED ORDER — SODIUM CHLORIDE 0.9% FLUSH
3.0000 mL | Freq: Two times a day (BID) | INTRAVENOUS | Status: DC
  Administered 2017-04-05: 3 mL via INTRAVENOUS

## 2017-04-04 MED ORDER — LIDOCAINE-PRILOCAINE 2.5-2.5 % EX CREA
1.0000 "application " | TOPICAL_CREAM | CUTANEOUS | Status: DC | PRN
  Filled 2017-04-04: qty 5

## 2017-04-04 MED ORDER — OXYCODONE HCL 5 MG PO TABS
5.0000 mg | ORAL_TABLET | ORAL | Status: DC | PRN
  Administered 2017-04-04 – 2017-04-11 (×7): 5 mg via ORAL
  Filled 2017-04-04 (×7): qty 1

## 2017-04-04 MED ORDER — LANTHANUM CARBONATE 500 MG PO CHEW
1000.0000 mg | CHEWABLE_TABLET | Freq: Three times a day (TID) | ORAL | Status: DC
  Administered 2017-04-05 – 2017-04-11 (×6): 1000 mg via ORAL
  Filled 2017-04-04 (×16): qty 2

## 2017-04-04 MED ORDER — SENNOSIDES-DOCUSATE SODIUM 8.6-50 MG PO TABS
1.0000 | ORAL_TABLET | Freq: Every day | ORAL | Status: DC
  Filled 2017-04-04 (×4): qty 1

## 2017-04-04 MED ORDER — LIDOCAINE HCL (PF) 1 % IJ SOLN
INTRAMUSCULAR | Status: AC
  Filled 2017-04-04: qty 5

## 2017-04-04 MED ORDER — DOXERCALCIFEROL 4 MCG/2ML IV SOLN
2.0000 ug | INTRAVENOUS | Status: DC
  Administered 2017-04-04 – 2017-04-11 (×4): 2 ug via INTRAVENOUS
  Filled 2017-04-04 (×3): qty 2

## 2017-04-04 MED ORDER — METOPROLOL TARTRATE 25 MG PO TABS
25.0000 mg | ORAL_TABLET | Freq: Two times a day (BID) | ORAL | Status: DC
  Administered 2017-04-04 – 2017-04-11 (×9): 25 mg via ORAL
  Filled 2017-04-04 (×10): qty 1

## 2017-04-04 MED ORDER — SODIUM CHLORIDE 0.9 % IV SOLN
100.0000 mL | INTRAVENOUS | Status: DC | PRN
Start: 2017-04-04 — End: 2017-04-07

## 2017-04-04 MED ORDER — ATORVASTATIN CALCIUM 40 MG PO TABS
40.0000 mg | ORAL_TABLET | Freq: Every evening | ORAL | Status: DC
  Administered 2017-04-04 – 2017-04-05 (×2): 40 mg via ORAL
  Filled 2017-04-04 (×2): qty 1

## 2017-04-04 MED ORDER — LEVOTHYROXINE SODIUM 25 MCG PO TABS
25.0000 ug | ORAL_TABLET | Freq: Every day | ORAL | Status: DC
  Administered 2017-04-05 – 2017-04-11 (×6): 25 ug via ORAL
  Filled 2017-04-04 (×6): qty 1

## 2017-04-04 MED ORDER — OXYCODONE HCL 5 MG PO TABS
10.0000 mg | ORAL_TABLET | Freq: Once | ORAL | Status: AC
  Administered 2017-04-04: 10 mg via ORAL

## 2017-04-04 MED ORDER — INSULIN ASPART 100 UNIT/ML ~~LOC~~ SOLN
0.0000 [IU] | Freq: Every day | SUBCUTANEOUS | Status: DC
  Administered 2017-04-04: 2 [IU] via SUBCUTANEOUS

## 2017-04-04 MED ORDER — SODIUM CHLORIDE 0.9 % IV SOLN: 250.0000 mL | INTRAVENOUS | Status: DC | PRN

## 2017-04-04 MED ORDER — OXYCODONE HCL 5 MG PO TABS
ORAL_TABLET | ORAL | Status: AC
  Administered 2017-04-04: 5 mg via ORAL
  Filled 2017-04-04: qty 1

## 2017-04-04 MED ORDER — ALTEPLASE 2 MG IJ SOLR: 2.0000 mg | Freq: Once | INTRAMUSCULAR | Status: DC | PRN

## 2017-04-04 MED ORDER — ONDANSETRON HCL 4 MG/2ML IJ SOLN
4.0000 mg | Freq: Four times a day (QID) | INTRAMUSCULAR | Status: DC | PRN
  Administered 2017-04-04 – 2017-04-05 (×2): 4 mg via INTRAVENOUS
  Filled 2017-04-04 (×2): qty 2

## 2017-04-04 MED ORDER — LIDOCAINE HCL (PF) 1 % IJ SOLN: 5.0000 mL | INTRAMUSCULAR | Status: DC | PRN

## 2017-04-04 MED ORDER — NITROGLYCERIN 0.4 MG SL SUBL: 0.4000 mg | SUBLINGUAL_TABLET | SUBLINGUAL | Status: DC | PRN

## 2017-04-04 MED ORDER — PENTAFLUOROPROP-TETRAFLUOROETH EX AERO
1.0000 "application " | INHALATION_SPRAY | CUTANEOUS | Status: DC | PRN
  Filled 2017-04-04: qty 30

## 2017-04-04 MED ORDER — ASPIRIN EC 81 MG PO TBEC
81.0000 mg | DELAYED_RELEASE_TABLET | Freq: Every day | ORAL | Status: DC
  Administered 2017-04-04 – 2017-04-05 (×2): 81 mg via ORAL
  Filled 2017-04-04 (×2): qty 1

## 2017-04-04 MED ORDER — VITAMIN B-1 100 MG PO TABS
100.0000 mg | ORAL_TABLET | Freq: Every day | ORAL | Status: DC
  Administered 2017-04-05 – 2017-04-11 (×5): 100 mg via ORAL
  Filled 2017-04-04 (×6): qty 1

## 2017-04-04 MED ORDER — LANTHANUM CARBONATE 500 MG PO CHEW
1000.0000 mg | CHEWABLE_TABLET | Freq: Three times a day (TID) | ORAL | Status: DC | PRN
  Administered 2017-04-11: 1000 mg via ORAL
  Filled 2017-04-04: qty 2

## 2017-04-04 MED ORDER — ONDANSETRON HCL 4 MG PO TABS
4.0000 mg | ORAL_TABLET | Freq: Four times a day (QID) | ORAL | Status: DC | PRN
  Administered 2017-04-05: 4 mg via ORAL
  Filled 2017-04-04: qty 1

## 2017-04-04 MED ORDER — DOXERCALCIFEROL 4 MCG/2ML IV SOLN
INTRAVENOUS | Status: AC
  Administered 2017-04-04: 2 ug via INTRAVENOUS
  Filled 2017-04-04: qty 2

## 2017-04-04 MED ORDER — INSULIN ASPART 100 UNIT/ML ~~LOC~~ SOLN
0.0000 [IU] | Freq: Three times a day (TID) | SUBCUTANEOUS | Status: DC
  Administered 2017-04-05: 1 [IU] via SUBCUTANEOUS
  Administered 2017-04-05: 2 [IU] via SUBCUTANEOUS
  Administered 2017-04-05: 3 [IU] via SUBCUTANEOUS
  Administered 2017-04-06 – 2017-04-07 (×3): 1 [IU] via SUBCUTANEOUS

## 2017-04-04 MED ORDER — HEPARIN SODIUM (PORCINE) 1000 UNIT/ML DIALYSIS
4000.0000 [IU] | Freq: Once | INTRAMUSCULAR | Status: DC
  Filled 2017-04-04: qty 4

## 2017-04-04 MED ORDER — SODIUM CHLORIDE 0.9 % IV SOLN
100.0000 mL | INTRAVENOUS | Status: DC | PRN
  Administered 2017-04-05 (×2): 100 mL via INTRAVENOUS

## 2017-04-04 MED ORDER — ONDANSETRON HCL 4 MG/2ML IJ SOLN
4.0000 mg | Freq: Four times a day (QID) | INTRAMUSCULAR | Status: DC | PRN
  Administered 2017-04-04: 4 mg via INTRAVENOUS

## 2017-04-04 MED ORDER — SODIUM CHLORIDE 0.9% FLUSH: 3.0000 mL | INTRAVENOUS | Status: DC | PRN

## 2017-04-04 MED ORDER — DOCUSATE SODIUM 100 MG PO CAPS
100.0000 mg | ORAL_CAPSULE | Freq: Two times a day (BID) | ORAL | Status: DC
  Administered 2017-04-04 – 2017-04-11 (×5): 100 mg via ORAL
  Filled 2017-04-04 (×10): qty 1

## 2017-04-04 MED ORDER — HEPARIN (PORCINE) IN NACL 100-0.45 UNIT/ML-% IJ SOLN
900.0000 [IU]/h | INTRAMUSCULAR | Status: DC
  Administered 2017-04-04: 750 [IU]/h via INTRAVENOUS
  Filled 2017-04-04 (×3): qty 250

## 2017-04-04 MED ORDER — HEPARIN BOLUS VIA INFUSION
4000.0000 [IU] | Freq: Once | INTRAVENOUS | Status: AC
  Administered 2017-04-04: 4000 [IU] via INTRAVENOUS
  Filled 2017-04-04: qty 4000

## 2017-04-04 MED ORDER — SODIUM CHLORIDE 0.9% FLUSH
3.0000 mL | Freq: Two times a day (BID) | INTRAVENOUS | Status: DC
  Administered 2017-04-05 – 2017-04-06 (×3): 3 mL via INTRAVENOUS

## 2017-04-04 NOTE — ED Notes (Signed)
Attempted iv stick x1 unsuccessfully. Another RN also attempted unsuccessfully

## 2017-04-04 NOTE — ED Provider Notes (Signed)
[facility] EMERGENCY DEPARTMENT Provider Note   CSN: [id number] Arrival date & time: 04/04/17  3149     History   Chief Complaint Chief Complaint  Patient presents with  . Nausea  . Emesis  . Shortness of Breath    HPI Brandi Arellano is a 61 y.o. female presenting with nausea, vomiting, and epigastric pain.  Patient states that she had acute onset nausea and vomiting yesterday.  She reports epigastric abdominal pain, present when she vomits.  She has vomited x3 without blood. She reports feeling well the day before.  She denies fevers, chills, chest pain, shortness of breath, difficulty breathing, or abnormal bowel movements.  She has a Foley cath with a minimal amount of urine she produces, states there is been no change.  She has ESRD on dialysis M/W/F.  When she went on Friday, they did not pull off any fluid, she did not need it.  She states she is due to dialysis today. She does not report feeling fluid overloaded today.  Per triage note, family told EMS that patient appears short of breath, but pt denies.  She is not on oxygen at home.   HPI  Past Medical History:  Diagnosis Date  . Anemia   . Bladder mass 02/09/2017  . CAD (coronary artery disease)    CABG  . CHF (congestive heart failure) (HCC)    Acute on chronic diastolic  . Complication of anesthesia   . Diabetes mellitus without complication (HCC)    Type II  . DVT (deep venous thrombosis) ([place])   . ESRD (end stage renal disease) ([place])    HD M-W-F  . Gangrene of lower extremity (HCC)    RLE  . GERD (gastroesophageal reflux disease)   . Hx of AKA (above knee amputation), right ([place])   . Hyperlipidemia   . Hypertension   . Hypothyroidism   . PONV (postoperative nausea and vomiting)   . Stroke [facility])    "light stroke" no deficits  . Thrombocytopenia ([place])   . Thyroid disease    Abnormal Thyroid function Test  . UTI (lower urinary tract infection)     Patient Active Problem List   Diagnosis Date Noted  . Palliative care by specialist   . Acute blood loss anemia   . 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 Bladder tumor 03/01/2017  . Type 2 diabetes mellitus with chronic kidney disease on chronic dialysis, with long-term current use of insulin ([place])   . Urinary tract infection associated with indwelling urethral catheter ([place])   . S/P dialysis catheter insertion ([place])   . Nausea and vomiting   . Generalized abdominal pain   . Stable angina pectoris ([place])   . Abnormal stress test   . Bladder mass 02/09/2017  . Preoperative evaluation to rule out surgical contraindication 02/09/2017  . Goals of care, counseling/discussion   . ESRD on hemodialysis ([place])   . Altered mental status 02/07/2017  . Axillary mass, left 07/01/2016  . Dupuytren's contracture of left hand 05/07/2016  . HLD (hyperlipidemia) 08/04/2015  . Conjunctivitis 08/04/2015  . Insomnia 07/09/2015  . Gangrene associated with diabetes mellitus ([place]) 03/20/2014  . Gastroparesis due to DM ([place]) 02/12/2014  . 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 Buerger's disease ([place]) 02/08/2014  . Hyperparathyroidism, secondary renal ([place]) 02/06/2014  . Hyperphosphatemia 02/06/2014  . Anemia due to chronic kidney disease, on chronic dialysis ([place]) 02/06/2014  . 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 Hypertension 12/26/2013  . DM type 2, uncontrolled, with renal complications ([place]) 77/82/4235  . CKD (chronic kidney disease) 12/26/2013  . Hypothyroidism 12/26/2013  . CAD (coronary artery disease) 12/26/2013  . ESRD on dialysis [facility]) 12/14/2013    Past Surgical History:  Procedure Laterality Date  . ABDOMINAL ANGIOGRAM  12/26/2013   Procedure: ABDOMINAL ANGIOGRAM;  Surgeon: Serafina Fazekas, MD;  Location: [facility] CATH LAB;  Service: Cardiovascular;;  . AMPUTATION Right 02/01/2014   Procedure: Right Below Knee Amputation;  Surgeon: Newt Minion, MD;  Location: [facility];   Service: Orthopedics;  Laterality: Right;  . AMPUTATION Right 03/24/2014   Procedure: AMPUTATION ABOVE KNEE;  Surgeon: Newt Minion, MD;  Location: [facility];  Service: Orthopedics;  Laterality: Right;  . ARCH AORTOGRAM  12/26/2013   Procedure: ARCH AORTOGRAM;  Surgeon: Serafina Cerritos, MD;  Location: [facility] CATH LAB;  Service: Cardiovascular;;  . AXILLARY ARTERY - BRACHIAL ARTERY BYPASS GRAFT  11/07/2013   At [place]     Bilateral  . CHOLECYSTECTOMY    . CORONARY ARTERY BYPASS GRAFT  2012   in [place], [place] W/ RETROGRADES Left 03/01/2017   Procedure: CYSTOSCOPY WITH RETROGRADE PYELOGRAM;  Surgeon: Irine Seal, MD;  Location: [facility];  Service: Urology;  Laterality: Left;  . FOOT AMPUTATION Bilateral   . HARDWARE REMOVAL Right 07/19/2014   Procedure: Removal Deep Hardware Right Femur;  Surgeon: Newt Minion, MD;  Location: [place] [facility];  Service: Orthopedics;  Laterality: Right;  Six screws and one condylar plate removed   . hemodialysis catheter    . INSERTION OF DIALYSIS CATHETER Right 02/07/2017   Procedure: INSERTION OF DIALYSIS CATHETER;  Surgeon: Rosetta Posner, MD;  Location: [place];  Service: Vascular;  Laterality: Right;  . LEFT HEART CATH AND CORS/GRAFTS ANGIOGRAPHY N/A 02/11/2017   Procedure: LEFT HEART CATH AND CORS/GRAFTS ANGIOGRAPHY;  Surgeon: Nelva Bush, MD;  Location: [facility] CV LAB;  Service: Cardiovascular;  Laterality: N/A;  . LOWER EXTREMITY ANGIOGRAM N/A 12/26/2013   Procedure: LOWER EXTREMITY ANGIOGRAM;  Surgeon: Serafina Hanshaw, MD;  Location: [facility] CATH LAB;  Service: Cardiovascular;  Laterality: N/A;  . TRANSURETHRAL RESECTION OF BLADDER TUMOR N/A 03/01/2017   Procedure: TRANSURETHRAL RESECTION OF LARGE BLADDER TUMOR (TURBT)FROM BLADDER NECK;  Surgeon: Irine Seal, MD;  Location: [facility];  Service: Urology;  Laterality: N/A;  . UPPER EXTREMITY ANGIOGRAM Right 12/26/2013   Procedure: UPPER EXTREMITY ANGIOGRAM;  Surgeon: Serafina Warnell, MD;  Location[facility] CATH LAB;  Service: Cardiovascular;  Laterality: Right;    OB History    Gravida Para Term Preterm AB Living   3 3           SAB TAB Ectopic Multiple Live Births                   Home Medications    Prior to Admission medications   Medication Sig Start Date End Date Taking? Authorizing Provider  amLODipine (NORVASC) 5 MG tablet Take 5 mg by mouth daily.    [provider]  aspirin EC 81 MG tablet Take 81 mg by mouth daily.     [provider]  atorvastatin (LIPITOR) 40 MG tablet Take 40 mg by mouth every evening.     [provider]  lanthanum (FOSRENOL) 1000 MG chewable tablet Chew 1,000 mg by mouth See admin instructions. Pt to take 1 tablet with meals three times daily and  1 tablet with snacks    [provider]  levothyroxine (SYNTHROID, LEVOTHROID) 25 MCG tablet Take 25 mcg by mouth daily before breakfast.    [provider]  metoprolol tartrate (LOPRESSOR) 25 MG tablet Take 1 tablet (25 mg total) by mouth 2 (two) times daily. 03/15/17   Shirley, [place], DO  senna-docusate (SENOKOT-S) 8.6-50 MG tablet Take 1 tablet by mouth at bedtime as needed for mild constipation. 03/15/17   Shirley, [place], DO  thiamine 100 MG tablet Take 1 tablet (100 mg total) by mouth daily. 03/16/17   Shirley, [place], DO    Family History Family History  Adopted: Yes  Problem Relation Age of Onset  . Hypertension Father   . Heart disease Father        Coronary Artery Disease    Social History Social History   Tobacco Use  . Smoking status: Former Smoker    Years: 30.00    Types: Cigarettes  . Smokeless tobacco: Never Used  . Tobacco comment: quit in 2002  Substance Use Topics  . Alcohol use: No  . Drug use: No     Allergies   Diphenhydramine; Tramadol hcl; and Morphine and related   Review of Systems Review of Systems  Gastrointestinal: Positive for abdominal pain, nausea and vomiting.  All other systems  reviewed and are negative.    Physical Exam Updated Vital Signs BP (!) 168/99   Pulse (!) 106   Temp 98 F (36.7 C)   Resp 20   Ht 5\' 7"  (1.702 m)   Wt 63 kg (138 lb 14.2 oz)   SpO2 94%   BMI 21.75 kg/m   Physical Exam  Constitutional: She is oriented to person, place, and time. She appears well-developed.  Chronically ill appearing female  HENT:  Head: Normocephalic and atraumatic.  Mouth/Throat: Mucous membranes are dry.  Eyes: Conjunctivae and EOM are normal. Pupils are equal, round, and reactive to light.  Neck: Normal range of motion. Neck supple.  Cardiovascular: Regular rhythm and intact distal pulses.  Murmur heard. tachycardic  Pulmonary/Chest: Effort normal and breath sounds normal. No respiratory distress. She has no wheezes.  Pt hypoxic on arrival. Low 90's with 2L O2.   Abdominal: Soft. Bowel sounds are normal. She exhibits no distension and no mass. There is tenderness. There is no rebound and no guarding.  TTP of epigastric abd. No TTP elsewhere in the abd. No rigidity, guarding or distention.   Musculoskeletal: Normal range of motion.  R AKA. L distal foot amputation. Pedal pulse intact. Mild lower leg edema. No TTP of posterior calf  Neurological: She is alert and oriented to person, place, and time.  Skin: Skin is warm and dry. There is pallor.  Psychiatric: She has a normal mood and affect.  Nursing note and vitals reviewed.    ED Treatments / Results  Labs (all labs ordered are listed, but only abnormal results are displayed) Labs Reviewed  CBC - Abnormal; Notable for the following components:      Result Value   WBC 11.3 (*)    RBC 3.19 (*)    Hemoglobin 9.7 (*)    HCT 30.4 (*)    RDW 17.9 (*)    All other components within normal limits  COMPREHENSIVE METABOLIC PANEL - Abnormal; Notable for the following components:   Chloride 99 (*)    Glucose, Bld 281 (*)    BUN 33 (*)    Creatinine, Ser 5.10 (*)    Total Protein 8.2 (*)  AST 42 (*)     ALT 11 (*)    GFR calc non Af Amer 8 (*)    GFR calc Af Amer 10 (*)    All other components within normal limits  URINALYSIS, ROUTINE W REFLEX MICROSCOPIC - Abnormal; Notable for the following components:   Color, Urine AMBER (*)    APPearance TURBID (*)    Glucose, UA >=500 (*)    Hgb urine dipstick SMALL (*)    Protein, ur 100 (*)    Leukocytes, UA LARGE (*)    Bacteria, UA MANY (*)    Squamous Epithelial / LPF 6-30 (*)    Non Squamous Epithelial 0-5 (*)    All other components within normal limits  CBC - Abnormal; Notable for the following components:   WBC 11.1 (*)    RBC 3.48 (*)    Hemoglobin 10.5 (*)    HCT 33.5 (*)    RDW 18.1 (*)    All other components within normal limits  CBC - Abnormal; Notable for the following components:   WBC 11.4 (*)    RBC 3.31 (*)    Hemoglobin 9.9 (*)    HCT 31.8 (*)    RDW 18.0 (*)    All other components within normal limits  I-STAT CHEM 8, ED - Abnormal; Notable for the following components:   Chloride 100 (*)    BUN 37 (*)    Creatinine, Ser 5.10 (*)    Glucose, Bld 294 (*)    Calcium, Ion 1.13 (*)    Hemoglobin 10.9 (*)    HCT 32.0 (*)    All other components within normal limits  I-STAT TROPONIN, ED - Abnormal; Notable for the following components:   Troponin i, poc 8.52 (*)    All other components within normal limits  I-STAT TROPONIN, ED - Abnormal; Notable for the following components:   Troponin i, poc >30.00 (*)    All other components within normal limits  RENAL FUNCTION PANEL  RENAL FUNCTION PANEL  TROPONIN I  TROPONIN I  TROPONIN I  HEPARIN LEVEL (UNFRACTIONATED)  I-STAT CG4 LACTIC ACID, ED  I-STAT CG4 LACTIC ACID, ED    EKG  EKG Interpretation  Date/Time:  Monday April 04 2017 09:30:06 EST Ventricular Rate:  107 PR Interval:    QRS Duration: 117 QT Interval:  373 QTC Calculation: 498 R Axis:   71 Text Interpretation:  Sinus tachycardia Incomplete left bundle branch block LVH with secondary  repolarization abnormality Anterior Q waves, possibly due to LVH No significant change since last tracing Confirmed by Orlie Dakin [phone]) on 04/04/2017 10:08:15 AM       Radiology Dg Chest Portable 1 View  Result Date: 04/04/2017 CLINICAL DATA:  Hypoxia. EXAM: PORTABLE CHEST 1 VIEW COMPARISON:  Radiograph of February 07, 2017. FINDINGS: Stable cardiomegaly with central pulmonary vascular congestion. Atherosclerosis of thoracic aorta is noted. Bilateral pulmonary edema is noted with right worse than left. No pneumothorax or pleural effusion is noted. Right internal jugular dialysis catheter is unchanged in position. Sternotomy wires are noted. Bony thorax is unremarkable. IMPRESSION: Bilateral pulmonary edema is noted, right worse than left. Aortic atherosclerosis. Electronically Signed   By: Marijo Conception, M.D.   On: 04/04/2017 10:48    Procedures .Critical Care Performed by: Franchot Heidelberg, PA-C Authorized by: Franchot Heidelberg, PA-C   Critical care provider statement:    Critical care time (minutes):  40   Critical care time was exclusive of:  Separately billable procedures and  treating other patients and teaching time   Critical care was necessary to treat or prevent imminent or life-threatening deterioration of the following conditions:  Renal failure and cardiac failure   Critical care was time spent personally by me on the following activities:  Discussions with consultants, development of treatment plan with patient or surrogate, evaluation of patient's response to treatment, examination of patient, obtaining history from patient or surrogate, review of old charts, re-evaluation of patient's condition, pulse oximetry, ordering and review of radiographic studies, ordering and review of laboratory studies and ordering and performing treatments and interventions   I assumed direction of critical care for this patient from another provider in my specialty: no   Comments:     Ill  appearing F with multiple medical conditions including ESRD, DM, HTN, and CAD. Pt received emergent dialysis.  Troponin critically elevated to greater than 30.     (including critical care time)  Medications Ordered in ED Medications  lidocaine (PF) (XYLOCAINE) 1 % injection (not administered)  pentafluoroprop-tetrafluoroeth (GEBAUERS) aerosol 1 application (not administered)  lidocaine (PF) (XYLOCAINE) 1 % injection 5 mL (not administered)  lidocaine-prilocaine (EMLA) cream 1 application (not administered)  0.9 %  sodium chloride infusion (not administered)  0.9 %  sodium chloride infusion (not administered)  heparin injection 1,000 Units (not administered)  alteplase (CATHFLO ACTIVASE) injection 2 mg (not administered)  heparin injection 4,000 Units (not administered)  heparin bolus via infusion 4,000 Units (not administered)  heparin ADULT infusion 100 units/mL (25000 units/22mL sodium chloride 0.45%) (not administered)     Initial Impression / Assessment and Plan / ED Course  I have reviewed the triage vital signs and the nursing notes.  Pertinent labs & imaging results that were available during my care of the patient were reviewed by me and considered in my medical decision making (see chart for details).     Pt presenting to to the ER with nausea and vomiting.  Family was worried about patient shortness of breath.  Upon arrival, patient was tachycardic and hypoxic.  67% pulse ox on room air.  Physical exam shows ill-appearing female who is fluid overloaded on exam.  Chest x-ray shows bilateral peripheral edema right greater than left.  Troponin elevated at 8.52.  EKG unchanged from prior. Creatinine and lactate reassuring.  Patient states that she last went to dialysis on Friday, although later tells the nephrologist that she went yesterday.  Will consult with cardiology and nephrology for management on dialysis and elevated troponin.  Patient to receive dialysis today.   Cardiology evaluated the patient.  Troponin increased to greater than 30.  Cardiology recommends hospitalist admission with nephrology and cardiology as consult.  Discussed with family medicine, and pt to be admitted under their service.   Final Clinical Impressions(s) / ED Diagnoses   Final diagnoses:  ESRD needing dialysis [facility])  Elevated troponin  Hypoxia    ED Discharge Orders    None       Franchot Heidelberg, PA-C 04/04/17 1533    Orlie Dakin, MD 04/04/17 [phone]

## 2017-04-04 NOTE — Consult Note (Signed)
[facility] Renal Consultation Note    Indication for Consultation:  Management of ESRD/hemodialysis; anemia, hypertension/volume and secondary hyperparathyroidism PCP: Kerin Perna, NP  HPI: Brandi Arellano is a 61 y.o. female with ESRD on hemodialysis MWF at [facility]. She has active bladder cancer followed by Dr. Roni Bread. CAD, CHF, H/O CABG, DMT2, DVT, H/O R AKA, HTN, hypothyroidism. Last HD was 04/03/17 on holiday schedule. She left 0.7 kg above EDW.   She presented to ED today with C/O nausea and vomiting since yesterday. She presented to ED with elevated HD and BP, O2 sat 91% on RA. She was found to have 1st troponin 8.52 2nd troponin >30. EKG shows more prominent ST depression in lateral leads than previous tracings, incomplete LBBB. CXR shows Bilateral pulmonary edema is noted with right worse than left. Scr 5.1 K+ 4.1 HGB 10.5. Lactic acid 1.8. She denies chest pain, SOB, only C/O nausea, vomiting and diffuse abdominal pain. She has 1+ edema BLE, + JVD and bibasilar crackles. Will have urgent HD for volume removal.   Past Medical History:  Diagnosis Date  . Anemia   . Bladder mass 02/09/2017  . CAD (coronary artery disease)    CABG  . CHF (congestive heart failure) (HCC)    Acute on chronic diastolic  . Complication of anesthesia   . Diabetes mellitus without complication (HCC)    Type II  . DVT (deep venous thrombosis) ([place])   . ESRD (end stage renal disease) ([place])    HD M-W-F  . Gangrene of lower extremity (HCC)    RLE  . GERD (gastroesophageal reflux disease)   . Hx of AKA (above knee amputation), right ([place])   . Hyperlipidemia   . Hypertension   . Hypothyroidism   . PONV (postoperative nausea and vomiting)   . Stroke [facility])    "light stroke" no deficits  . Thrombocytopenia ([place])   . Thyroid disease    Abnormal Thyroid function Test  . UTI (lower urinary tract infection)    Past Surgical History:  Procedure Laterality Date  .  ABDOMINAL ANGIOGRAM  12/26/2013   Procedure: ABDOMINAL ANGIOGRAM;  Surgeon: Serafina Serratore, MD;  Location: [facility] CATH LAB;  Service: Cardiovascular;;  . AMPUTATION Right 02/01/2014   Procedure: Right Below Knee Amputation;  Surgeon: Newt Minion, MD;  Location: [place];  Service: Orthopedics;  Laterality: Right;  . AMPUTATION Right 03/24/2014   Procedure: AMPUTATION ABOVE KNEE;  Surgeon: Newt Minion, MD;  Location: [place];  Service: Orthopedics;  Laterality: Right;  . ARCH AORTOGRAM  12/26/2013   Procedure: ARCH AORTOGRAM;  Surgeon: Serafina Thome, MD;  Location: [facility] CATH LAB;  Service: Cardiovascular;;  . AXILLARY ARTERY - BRACHIAL ARTERY BYPASS GRAFT  11/07/2013   At [place]     Bilateral  . CHOLECYSTECTOMY    . CORONARY ARTERY BYPASS GRAFT  2012   in [place], [place] W/ RETROGRADES Left 03/01/2017   Procedure: CYSTOSCOPY WITH RETROGRADE PYELOGRAM;  Surgeon: Irine Seal, MD;  Location: [facility];  Service: Urology;  Laterality: Left;  . FOOT AMPUTATION Bilateral   . HARDWARE REMOVAL Right 07/19/2014   Procedure: Removal Deep Hardware Right Femur;  Surgeon: Newt Minion, MD;  Location: [place];  Service: Orthopedics;  Laterality: Right;  Six screws and one condylar plate removed   . hemodialysis catheter    . INSERTION OF DIALYSIS CATHETER Right 02/07/2017   Procedure: INSERTION OF DIALYSIS CATHETER;  Surgeon: Rosetta Posner, MD;  Location: [place];  Service: Vascular;  Laterality: Right;  . LEFT HEART CATH AND CORS/GRAFTS ANGIOGRAPHY N/A 02/11/2017   Procedure: LEFT HEART CATH AND CORS/GRAFTS ANGIOGRAPHY;  Surgeon: Nelva Bush, MD;  Location: [place] CV LAB;  Service: Cardiovascular;  Laterality: N/A;  . LOWER EXTREMITY ANGIOGRAM N/A 12/26/2013   Procedure: LOWER EXTREMITY ANGIOGRAM;  Surgeon: Serafina Podolak, MD;  Location: [facility] CATH LAB;  Service: Cardiovascular;  Laterality: N/A;  . TRANSURETHRAL RESECTION OF BLADDER TUMOR N/A 03/01/2017    Procedure: TRANSURETHRAL RESECTION OF LARGE BLADDER TUMOR (TURBT)FROM BLADDER NECK;  Surgeon: Irine Seal, MD;  Location: [facility];  Service: Urology;  Laterality: N/A;  . UPPER EXTREMITY ANGIOGRAM Right 12/26/2013   Procedure: UPPER EXTREMITY ANGIOGRAM;  Surgeon: Serafina Mitnick, MD;  Location: [facility] CATH LAB;  Service: Cardiovascular;  Laterality: Right;   Family History  Adopted: Yes  Problem Relation Age of Onset  . Hypertension Father   . Heart disease Father        Coronary Artery Disease   Social History:  reports that she has quit smoking. Her smoking use included cigarettes. She quit after 30.00 years of use. she has never used smokeless tobacco. She reports that she does not drink alcohol or use drugs. Allergies  Allergen Reactions  . Diphenhydramine Nausea And Vomiting  . Tramadol Hcl Nausea And Vomiting  . Morphine And Related Nausea And Vomiting   Prior to Admission medications   Medication Sig Start Date End Date Taking? Authorizing Provider  amLODipine (NORVASC) 5 MG tablet Take 5 mg by mouth daily.    [provider]  aspirin EC 81 MG tablet Take 81 mg by mouth daily.     [provider]  atorvastatin (LIPITOR) 40 MG tablet Take 40 mg by mouth every evening.     [provider]  lanthanum (FOSRENOL) 1000 MG chewable tablet Chew 1,000 mg by mouth See admin instructions. Pt to take 1 tablet with meals three times daily and 1 tablet with snacks    [provider]  levothyroxine (SYNTHROID, LEVOTHROID) 25 MCG tablet Take 25 mcg by mouth daily before breakfast.    [provider]  metoprolol tartrate (LOPRESSOR) 25 MG tablet Take 1 tablet (25 mg total) by mouth 2 (two) times daily. 03/15/17   Shirley, [place], DO  senna-docusate (SENOKOT-S) 8.6-50 MG tablet Take 1 tablet by mouth at bedtime as needed for mild constipation. 03/15/17   Shirley, [place], DO  thiamine 100 MG tablet Take 1 tablet (100 mg total) by mouth daily. 03/16/17   Shirley,  [place], DO   Current Facility-Administered Medications  Medication Dose Route Frequency Provider Last Rate Last Dose  . 0.9 %  sodium chloride infusion  100 mL Intravenous PRN Valentina Gu, NP      . 0.9 %  sodium chloride infusion  100 mL Intravenous PRN Valentina Gu, NP      . alteplase (CATHFLO ACTIVASE) injection 2 mg  2 mg Intracatheter Once PRN Valentina Gu, NP      . heparin injection 1,000 Units  1,000 Units Dialysis PRN Valentina Gu, NP      . heparin injection 4,000 Units  4,000 Units Dialysis Once in dialysis Valentina Gu, NP      . lidocaine (PF) (XYLOCAINE) 1 % injection 5 mL  5 mL Intradermal PRN Valentina Gu, NP      . lidocaine (PF) (XYLOCAINE) 1 % injection           .  lidocaine-prilocaine (EMLA) cream 1 application  1 application Topical PRN Valentina Gu, NP      . pentafluoroprop-tetrafluoroeth (GEBAUERS) aerosol 1 application  1 application Topical PRN Valentina Gu, NP       Current Outpatient Medications  Medication Sig Dispense Refill  . amLODipine (NORVASC) 5 MG tablet Take 5 mg by mouth daily.    Marland Kitchen aspirin EC 81 MG tablet Take 81 mg by mouth daily.     Marland Kitchen atorvastatin (LIPITOR) 40 MG tablet Take 40 mg by mouth every evening.     Marland Kitchen lanthanum (FOSRENOL) 1000 MG chewable tablet Chew 1,000 mg by mouth See admin instructions. Pt to take 1 tablet with meals three times daily and 1 tablet with snacks    . levothyroxine (SYNTHROID, LEVOTHROID) 25 MCG tablet Take 25 mcg by mouth daily before breakfast.    . metoprolol tartrate (LOPRESSOR) 25 MG tablet Take 1 tablet (25 mg total) by mouth 2 (two) times daily. 60 tablet 0  . senna-docusate (SENOKOT-S) 8.6-50 MG tablet Take 1 tablet by mouth at bedtime as needed for mild constipation. 30 tablet 0  . thiamine 100 MG tablet Take 1 tablet (100 mg total) by mouth daily. 30 tablet 0   Facility-Administered Medications Ordered in Other Encounters  Medication Dose Route  Frequency Provider Last Rate Last Dose  . 0.9 %  sodium chloride infusion    Continuous PRN Laretta Alstrom, CRNA       Labs: Basic Metabolic Panel: Recent Labs  Lab 04/04/17 1021 04/04/17 1036  NA 140 139  K 4.1 4.1  CL 99* 100*  CO2 27  --   GLUCOSE 281* 294*  BUN 33* 37*  CREATININE 5.10* 5.10*  CALCIUM 9.7  --    Liver Function Tests: Recent Labs  Lab 04/04/17 1021  AST 42*  ALT 11*  ALKPHOS 75  BILITOT 0.6  PROT 8.2*  ALBUMIN 3.5   No results for input(s): LIPASE, AMYLASE in the last 168 hours. No results for input(s): AMMONIA in the last 168 hours. CBC: Recent Labs  Lab 04/04/17 1021 04/04/17 1036  WBC 11.3*  --   HGB 9.7* 10.9*  HCT 30.4* 32.0*  MCV 95.3  --   PLT 167  --    Cardiac Enzymes: No results for input(s): CKTOTAL, CKMB, CKMBINDEX, TROPONINI in the last 168 hours. CBG: No results for input(s): GLUCAP in the last 168 hours. Iron Studies: No results for input(s): IRON, TIBC, TRANSFERRIN, FERRITIN in the last 72 hours. Studies/Results: Dg Chest Portable 1 View  Result Date: 04/04/2017 CLINICAL DATA:  Hypoxia. EXAM: PORTABLE CHEST 1 VIEW COMPARISON:  Radiograph of February 07, 2017. FINDINGS: Stable cardiomegaly with central pulmonary vascular congestion. Atherosclerosis of thoracic aorta is noted. Bilateral pulmonary edema is noted with right worse than left. No pneumothorax or pleural effusion is noted. Right internal jugular dialysis catheter is unchanged in position. Sternotomy wires are noted. Bony thorax is unremarkable. IMPRESSION: Bilateral pulmonary edema is noted, right worse than left. Aortic atherosclerosis. Electronically Signed   By: Marijo Conception, M.D.   On: 04/04/2017 10:48    ROS: As per HPI otherwise negative.   Physical Exam: Vitals:   04/04/17 1330 04/04/17 1345 04/04/17 1353 04/04/17 1355  BP: (!) 167/100  (!) 164/103   Pulse: (!) 102 (!) 102  87  Resp: (!) 23 (!) 29  (!) 21  Temp:      TempSrc:      SpO2: 91% 91%   94%  Weight:      Height:         General: Chronically ill appearing female in no acute distress. Head: Normocephalic, atraumatic, sclera non-icteric, mucus membranes are moist Neck: Supple. JVD is elevated halfway to mandible.  Lungs: Bilateral breath sounds with bibasilar crackles. Respirations shallow, No WOB. O2 Sats 92-94% on 3 L/M [place],  Heart: RRR with S1 S2. 3/6 systolic M  Abdomen: Soft, non-tender, non-distended with normoactive bowel sounds. No rebound/guarding. No obvious abdominal masses Has foley patent with leg bag. . M-S:  Appears weak, strength equal.  Lower extremities: 1+ edema LLE. R AKA with trace stump edema.  Neuro: Alert and oriented X 3. Moves all extremities spontaneously. Psych:  Responds to questions appropriately with a normal affect. Slightly lethargic.  Dialysis Access: RIJ TDC drsg intact.   CXR 12/24 - bilat pulm edema  Dialysis: [facility] MWF 4h  EDW ?   Hep 4000   R IJ cath    160 NRe 400/Auto 1.5 Linear sodium UFP 4 -Hectorol 2 mcg IV TIW (no recent PTH) -Mircera 150 mcg IV q 2 weeks (last dose 50 mcg IV 03/23/17 Last HGB 8.6 04/09/17)   Assessment/Plan: 1.  Volume Overload: Bilateral pulmonary edema on CXR. Troponins > 30. Ran full treatment yesterday. O2 Sats low. Urgent HD for volume removal-attempt 3-3.5 liters.  2. Positive Troponins: Troponins now > 30. No chest pain. Cardiology consulted. Starting heparin drip.  3. Epigastric pain/ N & V: Still C/O abd pain. Liver enzymes WNL. Still nauseated. Per primary 4.  ESRD -  MWF via RIJ TDC. HD today off schedule. K+4.1. Hold heparin, starting heparin drip. 5.  Hypertension/volume  - BP elevated on adm. Currently 168/99. Volume overload by exam.  6.  Anemia  - HGB 10.9 ISTAT. Doubt accuracy. Rechecking.  7.  Metabolic bone disease -  Ca 9.7 C Ca 10 Checking renal function panel. Continue binders/VDRA.  8.  Nutrition - NPO at present.  9. H/O Squamous Cell Bladder Cancer: sp TURBT from bladder neck on  11/20. Followed by Dr. Roni Bread. No chemo.  10. Hx remote CABG - last admit had stress test w/ ischemia, LVEF 29%.  Had heart cath 11/18 and plan per cards was medical Rx.   Rita H. Owens Shark, NP-C 04/04/2017, 2:12 PM  [employer] [phone]  Pt seen, examined and agree w A/P as above.  ESRD pt with CP, ^^trop and pulm edema on CXR/ exam.  Plan HD today with max UF as tolerated.   [name] MD [employer] pager [phone]   04/04/2017, 4:47 PM

## 2017-04-04 NOTE — ED Notes (Addendum)
EDP at bedside to attempt EJ access.

## 2017-04-04 NOTE — Progress Notes (Signed)
ANTICOAGULATION CONSULT NOTE - Initial Consult  Pharmacy Consult for heparin Indication: chest pain/ACS  Allergies  Allergen Reactions  . Diphenhydramine Nausea And Vomiting  . Tramadol Hcl Nausea And Vomiting  . Morphine And Related Nausea And Vomiting    Patient Measurements: Height: 5\' 7"  (170.2 cm) Weight: 139 lb (63 kg) IBW/kg (Calculated) : 61.6 Heparin Dosing Weight: 63 kg  Assessment: [name] presents on 12/24 with N/V, abdominal pain, and weakness. Troponin elevated. Hx of CAD. Pharmacy to start heparin. Hgb 9.9, plts wnl.  Goal of Therapy:  Heparin level 0.3-0.7 units/ml Monitor platelets by anticoagulation protocol: Yes   Plan:  Give heparin 4,000 unit bolus Start heparin gtt at 750 units/hr Monitor daily heparin level, CBC, s/s of bleed  Brandi Arellano J 04/04/2017,2:48 PM

## 2017-04-04 NOTE — ED Notes (Signed)
Paged family med. For unassigned admission

## 2017-04-04 NOTE — ED Provider Notes (Signed)
Complains of vomiting 2 episodes since yesterday.  Also complains of diffuse abdominal pain.  She denies diarrhea.  She denies shortness of breath.  On exam she is ill appearing.  HEENT exam mucous memories pale dry neck supple positive JVD positive bilateral carotid endarterectomy scars lungs Rales at bases bilaterally.  Chest with dialysis catheter at right subclavian area.  Bilateral lower extremities with 2+ pretibial pitting edema. Chest x-ray reviewed by me.  Nephrology and cardiology service is consulted She will be taken emergently to hemodialysis   Orlie Dakin, MD 04/04/17 1211

## 2017-04-04 NOTE — ED Triage Notes (Signed)
Per [facility] patient coming from home complaining of nausea/vomitting x 2 days. Complaining of abdominal pain, and generalized weakness. Family reports shortness of breath but patient denies. Patient denies any chest pain. Dialysis patient, last dialyzed on Friday. Due today. Patient alert oriented x4. 4 mg zofran given in route

## 2017-04-04 NOTE — H&P (Deleted)
Cardiology Admission History and Physical:   Patient ID: Brandi Arellano; MRN: [id number]; DOB: 10/15/1955   Admission date: 04/04/2017  Primary Care Provider: Kerin Perna, NP Primary Cardiologist: Dr. Radford Pax  Chief Complaint:  Epigastric Pain, N/V w/ Elevated Troponin   Patient Profile:   Brandi Arellano is a 61 y.o. female with a hx of CAD s/p remote CABG, ESRD on HD, PVD s/p right BKA, HTN, HLD, DM and recent diagnosis of bladder cancer, s/p recent surgical resection of tumor, who is being seen today for the evaluation of abnormal troponin at the request of Dr. Grace Blight, ED physician.   History of Present Illness:   Pt was admitted last month for bladder mass and was seen in consultation by Dr. Radford Pax on 02/11/17 for preoperative evaluation/ risk assessment. Pt has a h/o CABG in 2012 but had not been studied since that time. She endorsed resting CP when getting emotionally upset and also with poor functional status (unable to ambulate due to lack of leg prosthesis). As part of her pre-op w/u, she was ordered to undergo pharmacological NST, which was interpreted as a high risk study with ischemia in the inferolateral wall from the base to the apex. EF was estimated at 29%. Subsequent LHC by Dr. Saunders Revel on 02/11/17 showed severe native coronary artery disease, including heavily calcified and diffusely diseased LAD with distal occlusion, chronic total occlusion of the ostial LCx, and chronic total occlusion of ostial RCA. She was noted to have a patent LIMA to distal LAD with 50% ostial stenosis as well as patent SVG-OM and SVG-RCA. Both grafts supplies small, diffusely diseased vessels. There were no good targets for PCI. Dr. Saunders Revel recommended continued medical therapy. Mild to moderate AS was also noted. She was continued on ASA, BB and statin and cleared for urologic surgery. On 03/01/2017, she underwent transurethral resection of a large bladder tumor from the bladder neck. Final pathology  revealed squamous cell carcinoma. Oncology consulted and felt minimal role for chemo (aggressive histology known to be relatively chemoresistant). Plan is to proceed with curative intent surgery (tentatively scheduled in Jan. 2019).  Pt now presents to the ED with complaint of abdominal pain x 1 day with associated n/v.  Pain located in epigastric region. She denies any chest pain. No dyspnea. Last HD session was on 04/01/17. She is due for session today. SCr is 5.10. K is WNL at 4.1. BP elevated in ED in the 341D-622W systolic and 979G diastolic. Glucose also high at 294. POC troponin is 8.52. Her last checked tropoinin and only troponin on file is from 11/11/16 which was 0.05. EKG shows incomplete LBBB with LVH.    Repeat 2nd POC troponin is now >30.      Past Medical History:  Diagnosis Date  . Anemia   . Bladder mass 02/09/2017  . CAD (coronary artery disease)    CABG  . CHF (congestive heart failure) (HCC)    Acute on chronic diastolic  . Complication of anesthesia   . Diabetes mellitus without complication (HCC)    Type II  . DVT (deep venous thrombosis) ([place])   . ESRD (end stage renal disease) ([place])    HD M-W-F  . Gangrene of lower extremity (HCC)    RLE  . GERD (gastroesophageal reflux disease)   . Hx of AKA (above knee amputation), right ([place])   . Hyperlipidemia   . Hypertension   . Hypothyroidism   . PONV (postoperative nausea and vomiting)   . Stroke [facility])    "  light stroke" no deficits  . Thrombocytopenia ([place])   . Thyroid disease    Abnormal Thyroid function Test  . UTI (lower urinary tract infection)     Past Surgical History:  Procedure Laterality Date  . ABDOMINAL ANGIOGRAM  12/26/2013   Procedure: ABDOMINAL ANGIOGRAM;  Surgeon: Serafina Nesser, MD;  Location: [facility] CATH LAB;  Service: Cardiovascular;;  . AMPUTATION Right 02/01/2014   Procedure: Right Below Knee Amputation;  Surgeon: Newt Minion, MD;  Location: [place];  Service: Orthopedics;  Laterality: Right;   . AMPUTATION Right 03/24/2014   Procedure: AMPUTATION ABOVE KNEE;  Surgeon: Newt Minion, MD;  Location: [place];  Service: Orthopedics;  Laterality: Right;  . ARCH AORTOGRAM  12/26/2013   Procedure: ARCH AORTOGRAM;  Surgeon: Serafina Birdwell, MD;  Location: [facility] CATH LAB;  Service: Cardiovascular;;  . AXILLARY ARTERY - BRACHIAL ARTERY BYPASS GRAFT  11/07/2013   At [place]     Bilateral  . CHOLECYSTECTOMY    . CORONARY ARTERY BYPASS GRAFT  2012   in [place] W/ RETROGRADES Left 03/01/2017   Procedure: CYSTOSCOPY WITH RETROGRADE PYELOGRAM;  Surgeon: Irine Seal, MD;  Location: [facility];  Service: Urology;  Laterality: Left;  . FOOT AMPUTATION Bilateral   . HARDWARE REMOVAL Right 07/19/2014   Procedure: Removal Deep Hardware Right Femur;  Surgeon: Newt Minion, MD;  Location: [place];  Service: Orthopedics;  Laterality: Right;  Six screws and one condylar plate removed   . hemodialysis catheter    . INSERTION OF DIALYSIS CATHETER Right 02/07/2017   Procedure: INSERTION OF DIALYSIS CATHETER;  Surgeon: Rosetta Posner, MD;  Location: [place];  Service: Vascular;  Laterality: Right;  . LEFT HEART CATH AND CORS/GRAFTS ANGIOGRAPHY N/A 02/11/2017   Procedure: LEFT HEART CATH AND CORS/GRAFTS ANGIOGRAPHY;  Surgeon: Nelva Bush, MD;  Location: [place] CV LAB;  Service: Cardiovascular;  Laterality: N/A;  . LOWER EXTREMITY ANGIOGRAM N/A 12/26/2013   Procedure: LOWER EXTREMITY ANGIOGRAM;  Surgeon: Serafina Whitenight, MD;  Location: [facility] CATH LAB;  Service: Cardiovascular;  Laterality: N/A;  . TRANSURETHRAL RESECTION OF BLADDER TUMOR N/A 03/01/2017   Procedure: TRANSURETHRAL RESECTION OF LARGE BLADDER TUMOR (TURBT)FROM BLADDER NECK;  Surgeon: Irine Seal, MD;  Location: [facility];  Service: Urology;  Laterality: N/A;  . UPPER EXTREMITY ANGIOGRAM Right 12/26/2013   Procedure: UPPER EXTREMITY ANGIOGRAM;  Surgeon: Serafina Toppin, MD;  Location[facility] CATH LAB;  Service:  Cardiovascular;  Laterality: Right;     Medications Prior to Admission: Prior to Admission medications   Medication Sig Start Date End Date Taking? Authorizing Provider  amLODipine (NORVASC) 5 MG tablet Take 5 mg by mouth daily.    [provider]  aspirin EC 81 MG tablet Take 81 mg by mouth daily.     [provider]  atorvastatin (LIPITOR) 40 MG tablet Take 40 mg by mouth every evening.     [provider]  lanthanum (FOSRENOL) 1000 MG chewable tablet Chew 1,000 mg by mouth See admin instructions. Pt to take 1 tablet with meals three times daily and 1 tablet with snacks    [provider]  levothyroxine (SYNTHROID, LEVOTHROID) 25 MCG tablet Take 25 mcg by mouth daily before breakfast.    [provider]  metoprolol tartrate (LOPRESSOR) 25 MG tablet Take 1 tablet (25 mg total) by mouth 2 (two) times daily. 03/15/17   Shirley, [place], DO  senna-docusate (SENOKOT-S) 8.6-50 MG tablet Take  1 tablet by mouth at bedtime as needed for mild constipation. 03/15/17   Shirley, [place], DO  thiamine 100 MG tablet Take 1 tablet (100 mg total) by mouth daily. 03/16/17   Shirley, [place], DO     Allergies:    Allergies  Allergen Reactions  . Diphenhydramine Nausea And Vomiting  . Tramadol Hcl Nausea And Vomiting  . Morphine And Related Nausea And Vomiting    Social History:   Social History   Socioeconomic History  . Marital status: Divorced    Spouse name: Not on file  . Number of children: Not on file  . Years of education: Not on file  . Highest education level: Not on file  Social Needs  . Financial resource strain: Not on file  . Food insecurity - worry: Not on file  . Food insecurity - inability: Not on file  . Transportation needs - medical: Not on file  . Transportation needs - non-medical: Not on file  Occupational History  . Not on file  Tobacco Use  . Smoking status: Former Smoker    Years: 30.00    Types: Cigarettes  . Smokeless  tobacco: Never Used  . Tobacco comment: quit in 2002  Substance and Sexual Activity  . Alcohol use: No  . Drug use: No  . Sexual activity: Not on file  Other Topics Concern  . Not on file  Social History Narrative   Pt currently living with her brother.   Independent w/ transfers bed>wheelchair    Family History:  The patient's family history includes Heart disease in her father; Hypertension in her father. She was adopted.    ROS:  Please see the history of present illness. All other ROS reviewed and negative.     Physical Exam/Data:   Vitals:   04/04/17 1330 04/04/17 1345 04/04/17 1353 04/04/17 1355  BP: (!) 167/100  (!) 164/103   Pulse: (!) 102 (!) 102  87  Resp: (!) 23 (!) 29  (!) 21  Temp:      TempSrc:      SpO2: 91% 91%  94%  Weight:      Height:       No intake or output data in the 24 hours ending 04/04/17 1429 Filed Weights   04/04/17 0941  Weight: 139 lb (63 kg)   Body mass index is 21.77 kg/m.  General:  Well nourished, well developed, in no acute distress, ill appearing  HEENT: normal Lymph: no adenopathy Neck: no JVD Endocrine:  No thryomegaly Vascular: No carotid bruits; FA pulses 2+ bilaterally without bruits  Cardiac: RRR; 2/6 AS murmur Lungs:  clear to auscultation bilaterally, no wheezing, rhonchi or rales  Abd: soft, nontender, no hepatomegaly  Ext: no edema, s/p right BKA Musculoskeletal:  No deformities, BUE and BLE strength normal and equal Skin: warm and dry  Neuro:  CNs 2-12 intact, no focal abnormalities noted Psych:  Normal affect    EKG:  The ECG  was personally reviewed and demonstrates incomplete LBBB w/ LVH  Relevant CV Studies: [facility] 02/11/17 Procedures   LEFT HEART CATH AND CORS/GRAFTS ANGIOGRAPHY  Conclusion   Conclusions: 1. Severe native coronary artery disease, including heavily calcified and diffusely diseased LAD with distal occlusion, chronic total occlusion of the ostial LCx, and chronic total occlusion of ostial  RCA. 2. Patent LIMA to distal LAD with 50% ostial stenosis. 3. Patent SVG to OM and SVG to RCA. Both grafts supplies small, diffusely diseased vessels. 4. Normal left ventricular filling  pressure. 5. Mild to moderate aortic stenosis.  Recommendations: 1. No good targets for percutaneous intervention. Recommend medical therapy. 2. Aggressive secondary prevention.      Laboratory Data:  Chemistry Recent Labs  Lab 04/04/17 1021 04/04/17 1036  NA 140 139  K 4.1 4.1  CL 99* 100*  CO2 27  --   GLUCOSE 281* 294*  BUN 33* 37*  CREATININE 5.10* 5.10*  CALCIUM 9.7  --   GFRNONAA 8*  --   GFRAA 10*  --   ANIONGAP 14  --     Recent Labs  Lab 04/04/17 1021  PROT 8.2*  ALBUMIN 3.5  AST 42*  ALT 11*  ALKPHOS 75  BILITOT 0.6   Hematology Recent Labs  Lab 04/04/17 1021 04/04/17 1036 04/04/17 1349  WBC 11.3*  --  11.1*  RBC 3.19*  --  3.48*  HGB 9.7* 10.9* 10.5*  HCT 30.4* 32.0* 33.5*  MCV 95.3  --  96.3  MCH 30.4  --  30.2  MCHC 31.9  --  31.3  RDW 17.9*  --  18.1*  PLT 167  --  164   Cardiac EnzymesNo results for input(s): TROPONINI in the last 168 hours.  Recent Labs  Lab 04/04/17 1033 04/04/17 1400  TROPIPOC 8.52* >30.00*    BNPNo results for input(s): BNP, PROBNP in the last 168 hours.  DDimer No results for input(s): DDIMER in the last 168 hours.  Radiology/Studies:  Dg Chest Portable 1 View  Result Date: 04/04/2017 CLINICAL DATA:  Hypoxia. EXAM: PORTABLE CHEST 1 VIEW COMPARISON:  Radiograph of February 07, 2017. FINDINGS: Stable cardiomegaly with central pulmonary vascular congestion. Atherosclerosis of thoracic aorta is noted. Bilateral pulmonary edema is noted with right worse than left. No pneumothorax or pleural effusion is noted. Right internal jugular dialysis catheter is unchanged in position. Sternotomy wires are noted. Bony thorax is unremarkable. IMPRESSION: Bilateral pulmonary edema is noted, right worse than left. Aortic atherosclerosis.  Electronically Signed   By: Marijo Conception, M.D.   On: 04/04/2017 10:48    Assessment and Plan:    1. NSTEMI/CAD: Initial POC troponin abnormal at 8.52. 2nd POC troponin is now  >30. EKG shows incomplete LBBB and LVH. Known CAD with CABG in 2012. Recent LHC 02/11/17 with patent grafts but 50% ostial LIMA graft disease to LAD. No PCI at the time. Medical therapy recommended at that time. Pt denies recent CP but main complaint is abdominal/epigastric pain with associated nausea and vomiting. She is a diabetic and BG in ED is elevated at 2.94. Given history, EKG and degree of troponin elevation, she will need a repeat LHC to ensure no changes/ down grafts. We will start IV heparin. Continue ASA, metoprolol and Lipitor. Will try to arrange LHC today after HD.   2. Aortic Stenosis: mild to moderate on recent cath and echo.    3. ESRD: on HD. Per nephrology.   4. DM: poorly controlled. BG in the upper 200s. SSI. May need diabetes coordinator to assist with medication management.  5. HTN: elevated. Will order home metoprolol and amlodipine.   6. HLD: continue statin therapy w/ Lipitor.     Severity of Illness: The appropriate patient status for this patient is INPATIENT. Inpatient status is judged to be reasonable and necessary in order to provide the required intensity of service to ensure the patient's safety. The patient's presenting symptoms, physical exam findings, and initial radiographic and laboratory data in the context of their chronic comorbidities is felt to place  them at high risk for further clinical deterioration. Furthermore, it is not anticipated that the patient will be medically stable for discharge from the hospital within 2 midnights of admission. The following factors support the patient status of inpatient.   " The patient's presenting symptoms include epigastric pain, nausea and vomitting. " The worrisome physical exam findings include abnormal EKG and murmur " The  initial radiographic and laboratory data are worrisome because of abnormal EKG and markedly elevated troponin. " The chronic co-morbidities include bladder CA, DM, HTN, HLD and PVD, known CAD.    I certify that at the point of admission it is my clinical judgment that the patient will require inpatient hospital care spanning beyond 2 midnights from the point of admission due to high intensity of service, high risk for further deterioration and high frequency of surveillance required.*    For questions or updates, please contact [place] Please consult [email or web address] for contact info under Cardiology/STEMI.    Signed, Lyda Jester, PA-C  04/04/2017 2:29 PM

## 2017-04-04 NOTE — Progress Notes (Signed)
Dialysis treatment completed.  3500 mL ultrafiltrated.  3000 mL net fluid removal.  Patient status unchanged. Lung sounds diminished to ausculation in all fields. Generalized edema. Cardiac: NSR.  Cleansed RIJ catheter with chlorhexidine.  Disconnected lines and flushed ports with saline per protocol.  Ports locked with heparin and capped per protocol.    Report given to bedside, RN Aleksey.

## 2017-04-04 NOTE — Progress Notes (Signed)
Patient arrived to unit by ED stretcher.  Reviewed treatment plan and this RN agrees with plan.  Report received from ED RN, Hildred Alamin.  Consent obtained.  Patient A & O X 4.   Lung sounds diminished and coarse to ausculation in all fields. BLE non pitting edema. Cardiac:  ST.  Removed caps and cleansed RIJ catheter with chlorhedxidine.  Aspirated ports of heparin and flushed them with saline per protocol.  Connected and secured lines, initiated treatment at 1430.  UF Goal of 3500 mL and net fluid removal 3 L.  Will continue to monitor.

## 2017-04-04 NOTE — H&P (Signed)
[facility] Admission History and Physical Service Pager: [phone]  Patient name: Brandi Arellano Medical record number: [id number] Date of birth: Jan 23, 1956 Age: 61 y.o. Gender: female  Primary Care Provider: Kerin Perna, NP Consultants: nephrology, cardiology Code Status: partial- DNI  Chief Complaint: nausea and vomiting  Assessment and Plan: Brandi Arellano is a 61 y.o. female presenting with nausea and vomiting, found to be hypoxic with bilateral pleural effusions. PMH is significant for ESRD, HTN, T2DM, invasive bladder cancer  Hypoxia- improved with supplemental O2. CXR showing bilateral pulmonary edema, L>R. On 4L [place] in ED with improvement in O2 sats to 100. Mild leukocytosis on admission, patient is afebrile, no signs of pneumonia on CXR. Hypoxia may be secondary to acute coronary event. Patient did emphasize on admission she is DNI.  -admit to step down attending Dr. Nori Riis -vitals per floor -continuous pulse ox and cardiac monitoring -supplemental O2 as needed to keep sats > 90% -nephrology c/s for HD, patient taken to HD from ED which will hopefully pulmonary edema and respiratory status -consider further chest imaging if respiratory status declines -low threshold to involve CCM  Tropinemia- initial trop 8.52 then trended up to >30. Hx of CAD. Patient denies chest pain but does have diabetes. She was hypoxic on admission. EKG with LBBB. Pulmonary edema may be due to severe cardiac insult. Cardiology was consulted and started patient on heparin drip. -cardiology following, appreciate recommendations -continue heparin drip -will likely need cath -monitor on tele -am EKG -trend trops -prn nitro ordered  Nausea and vomiting- per patient began after HD 12/23 and has been intractable along w/ abdominal pain. Patient is constipated which may be contributing. Also with known invasive bladder cancer that may be causing pain. Patient with elevated  troponins so maybe cardiac etiology however she denies chest pain. -will obtain KUB -consider further imaging w/ CT -zofran as needed for nausea -oxy IR for pain  ESRD on HD- got HD 12/23 and 12/24 -nephrology following -daily renal function panels  Invasive bladder cancer- chronic indwelling Foley in place -patient was to follow up w/ urology for total GUectomy in January  HTN- elevated BP in HD, will likely improve w/ HD -continue amlodipine and metop  T2DM- last A1C 6.8 02/07/17. Not on any home meds. Glucose elevated on admission >250 -sensitive SSI -CBGs AC/HS  Hypothyroidism- stable, on synthroid -continue home synthroid -check TSH  HLD- stable, on lipitor -continue home medication  FEN/GI: renal/carb modified Prophylaxis: heparin  Disposition: admit to step down for further management  History of Present Illness:  Brandi Arellano is a 61 y.o. female presenting with nausea and vomiting and found to be hypoxic to 60's on room air in ED. Supplemental O2 was given with improvement in sats on 4L [place]. There was question if patient missed HD yesterday (Holiday schedule) but renal floor confirmed patient did get HD 12/23. She is seen in HD. Patient reports nausea and vomiting that began after HD yesterday and has persisted to today. She endorses abdominal pain. She points to her lower abdomen when asked where pain is the worst. It came on suddenly. Nothing has made the pain better. Nothing has made it worse than she can identify. She is living with brother currently. She denies chest pain, shortness of breath.   Review Of Systems: Per HPI with the following additions:   Review of Systems  Constitutional: Negative for chills, diaphoresis and fever.  HENT: Negative for congestion.   Eyes: Negative for double vision.  Respiratory: Positive for shortness of breath. Negative for cough, sputum production and wheezing.   Cardiovascular: Negative for chest pain.  Gastrointestinal:  Positive for abdominal pain, constipation, nausea and vomiting. Negative for diarrhea.  Genitourinary: Negative for dysuria and hematuria.  Musculoskeletal: Negative for falls.  Neurological: Negative for dizziness, speech change, focal weakness, loss of consciousness, weakness and headaches.  Psychiatric/Behavioral: Negative for substance abuse.    Patient Active Problem List   Diagnosis Date Noted  . Hypoxia 04/04/2017  . Palliative care by specialist   . Acute blood loss anemia   . Bladder carcinoma ([place]) 03/05/2017  . Bladder tumor 03/01/2017  . Type 2 diabetes mellitus with chronic kidney disease on chronic dialysis, with long-term current use of insulin ([place])   . Urinary tract infection associated with indwelling urethral catheter ([place])   . S/P dialysis catheter insertion ([place])   . Nausea and vomiting   . Generalized abdominal pain   . Stable angina pectoris ([place])   . Abnormal stress test   . Bladder mass 02/09/2017  . Preoperative evaluation to rule out surgical contraindication 02/09/2017  . Goals of care, counseling/discussion   . ESRD on hemodialysis ([place])   . Altered mental status 02/07/2017  . Axillary mass, left 07/01/2016  . Dupuytren's contracture of left hand 05/07/2016  . HLD (hyperlipidemia) 08/04/2015  . Conjunctivitis 08/04/2015  . Insomnia 07/09/2015  . Gangrene associated with diabetes mellitus ([place]) 03/20/2014  . Gastroparesis due to DM ([place]) 02/12/2014  . Status post above knee amputation, right ([place]) 02/08/2014  . Buerger's disease ([place]) 02/08/2014  . Hyperparathyroidism, secondary renal ([place]) 02/06/2014  . Hyperphosphatemia 02/06/2014  . Anemia due to chronic kidney disease, on chronic dialysis ([place]) 02/06/2014  . 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 Hypertension 12/26/2013  . DM type 2, uncontrolled, with renal complications ([place]) 63/04/6008  . CKD (chronic kidney disease) 12/26/2013  . Hypothyroidism 12/26/2013   . CAD (coronary artery disease) 12/26/2013  . ESRD on dialysis [facility]) 12/14/2013    Past Medical History: Past Medical History:  Diagnosis Date  . Anemia   . Bladder mass 02/09/2017  . CAD (coronary artery disease)    CABG  . CHF (congestive heart failure) (HCC)    Acute on chronic diastolic  . Complication of anesthesia   . Diabetes mellitus without complication (HCC)    Type II  . DVT (deep venous thrombosis) ([place])   . ESRD (end stage renal disease) ([place])    HD M-W-F  . Gangrene of lower extremity (HCC)    RLE  . GERD (gastroesophageal reflux disease)   . Hx of AKA (above knee amputation), right ([place])   . Hyperlipidemia   . Hypertension   . Hypothyroidism   . PONV (postoperative nausea and vomiting)   . Stroke [facility])    "light stroke" no deficits  . Thrombocytopenia ([place])   . Thyroid disease    Abnormal Thyroid function Test  . UTI (lower urinary tract infection)     Past Surgical History: Past Surgical History:  Procedure Laterality Date  . ABDOMINAL ANGIOGRAM  12/26/2013   Procedure: ABDOMINAL ANGIOGRAM;  Surgeon: Serafina Sweet, MD;  Location: [facility] CATH LAB;  Service: Cardiovascular;;  . AMPUTATION Right 02/01/2014   Procedure: Right Below Knee Amputation;  Surgeon: Newt Minion, MD;  Location: [place];  Service: Orthopedics;  Laterality: Right;  . AMPUTATION Right 03/24/2014   Procedure: AMPUTATION ABOVE KNEE;  Surgeon: Newt Minion, MD;  Location:  [place] OR;  Service: Orthopedics;  Laterality: Right;  . ARCH AORTOGRAM  12/26/2013   Procedure: ARCH AORTOGRAM;  Surgeon: Serafina Orihuela, MD;  Location: [facility] CATH LAB;  Service: Cardiovascular;;  . AXILLARY ARTERY - BRACHIAL ARTERY BYPASS GRAFT  11/07/2013   At [place]     Bilateral  . CHOLECYSTECTOMY    . CORONARY ARTERY BYPASS GRAFT  2012   in [place], [place] W/ RETROGRADES Left 03/01/2017   Procedure: CYSTOSCOPY WITH RETROGRADE PYELOGRAM;  Surgeon: Irine Seal, MD;   Location: [facility] ORS;  Service: Urology;  Laterality: Left;  . FOOT AMPUTATION Bilateral   . HARDWARE REMOVAL Right 07/19/2014   Procedure: Removal Deep Hardware Right Femur;  Surgeon: Newt Minion, MD;  Location: [place];  Service: Orthopedics;  Laterality: Right;  Six screws and one condylar plate removed   . hemodialysis catheter    . INSERTION OF DIALYSIS CATHETER Right 02/07/2017   Procedure: INSERTION OF DIALYSIS CATHETER;  Surgeon: Rosetta Posner, MD;  Location: [place];  Service: Vascular;  Laterality: Right;  . LEFT HEART CATH AND CORS/GRAFTS ANGIOGRAPHY N/A 02/11/2017   Procedure: LEFT HEART CATH AND CORS/GRAFTS ANGIOGRAPHY;  Surgeon: Nelva Bush, MD;  Location: [place] CV LAB;  Service: Cardiovascular;  Laterality: N/A;  . LOWER EXTREMITY ANGIOGRAM N/A 12/26/2013   Procedure: LOWER EXTREMITY ANGIOGRAM;  Surgeon: Serafina Boyland, MD;  Location: [facility] CATH LAB;  Service: Cardiovascular;  Laterality: N/A;  . TRANSURETHRAL RESECTION OF BLADDER TUMOR N/A 03/01/2017   Procedure: TRANSURETHRAL RESECTION OF LARGE BLADDER TUMOR (TURBT)FROM BLADDER NECK;  Surgeon: Irine Seal, MD;  Location: [facility] ORS;  Service: Urology;  Laterality: N/A;  . UPPER EXTREMITY ANGIOGRAM Right 12/26/2013   Procedure: UPPER EXTREMITY ANGIOGRAM;  Surgeon: Serafina Mensinger, MD;  Location: [facility] CATH LAB;  Service: Cardiovascular;  Laterality: Right;    Social History: Social History   Tobacco Use  . Smoking status: Former Smoker    Years: 30.00    Types: Cigarettes  . Smokeless tobacco: Never Used  . Tobacco comment: quit in 2002  Substance Use Topics  . Alcohol use: No  . Drug use: No   Additional social history: living with brother  Please also refer to relevant sections of EMR.  Family History: Family History  Adopted: Yes  Problem Relation Age of Onset  . Hypertension Father   . Heart disease Father        Coronary Artery Disease   Allergies and Medications: Allergies  Allergen Reactions  .  Diphenhydramine Nausea And Vomiting  . Tramadol Hcl Nausea And Vomiting  . Morphine And Related Nausea And Vomiting   Current Facility-Administered Medications on File Prior to Encounter  Medication Dose Route Frequency Provider Last Rate Last Dose  . 0.9 %  sodium chloride infusion    Continuous PRN Laretta Alstrom, CRNA       Current Outpatient Medications on File Prior to Encounter  Medication Sig Dispense Refill  . amLODipine (NORVASC) 5 MG tablet Take 5 mg by mouth daily.    Marland Kitchen aspirin EC 81 MG tablet Take 81 mg by mouth daily.     Marland Kitchen atorvastatin (LIPITOR) 40 MG tablet Take 40 mg by mouth every evening.     Marland Kitchen lanthanum (FOSRENOL) 1000 MG chewable tablet Chew 1,000 mg by mouth See admin instructions. Pt to take 1 tablet with meals three times daily and 1 tablet with snacks    . levothyroxine (SYNTHROID, LEVOTHROID) 25 MCG  tablet Take 25 mcg by mouth daily before breakfast.    . metoprolol tartrate (LOPRESSOR) 25 MG tablet Take 1 tablet (25 mg total) by mouth 2 (two) times daily. 60 tablet 0  . senna-docusate (SENOKOT-S) 8.6-50 MG tablet Take 1 tablet by mouth at bedtime as needed for mild constipation. 30 tablet 0  . thiamine 100 MG tablet Take 1 tablet (100 mg total) by mouth daily. 30 tablet 0    Objective: BP (!) 166/98   Pulse 97   Temp 98 F (36.7 C)   Resp 20   Ht 5\' 7"  (1.702 m)   Wt 138 lb 14.2 oz (63 kg)   SpO2 94%   BMI 21.75 kg/m  Exam: General: laying in bed with [place] in place in NAD Eyes: EOMI, sclera white ENTM: MMM Neck: supple, non-tender Cardiovascular: RRR, no MRG Respiratory: CTAB. No increased work of breathing Gastrointestinal: soft, non-distended. Diffusely tender worse in suprapubic region. No guarding or rebound tenderness. +BS MSK: left AKA. No edema or cyanosis in right leg Derm: warm, dry, no rashes appreciated Neuro: no focal deficits Psych: appropriate affect  Labs and Imaging: CBC BMET  Recent Labs  Lab 04/04/17 1430  WBC 11.4*  HGB  9.9*  HCT 31.8*  PLT 187   Recent Labs  Lab 04/04/17 1430  NA 138  K 4.2  CL 98*  CO2 26  BUN 37*  CREATININE 5.31*  GLUCOSE 257*  CALCIUM 9.3     Dg Chest Portable 1 View  Result Date: 04/04/2017 CLINICAL DATA:  Hypoxia. EXAM: PORTABLE CHEST 1 VIEW COMPARISON:  Radiograph of February 07, 2017. FINDINGS: Stable cardiomegaly with central pulmonary vascular congestion. Atherosclerosis of thoracic aorta is noted. Bilateral pulmonary edema is noted with right worse than left. No pneumothorax or pleural effusion is noted. Right internal jugular dialysis catheter is unchanged in position. Sternotomy wires are noted. Bony thorax is unremarkable. IMPRESSION: Bilateral pulmonary edema is noted, right worse than left. Aortic atherosclerosis. Electronically Signed   By: Marijo Conception, M.D.   On: 04/04/2017 10:48     Steve Rattler, DO 04/04/2017, 4:42 PM PGY-2, [place] Intern pager: [phone], text pages welcome

## 2017-04-04 NOTE — Consult Note (Signed)
Cardiology Consultation:   Patient ID: Brandi Arellano; [id number]; 05-26-1955   Admit date: 04/04/2017 Date of Consult: 04/04/2017  Primary Care Provider: Kerin Perna, NP Primary Cardiologist: Dr. Radford Pax    Patient Profile:   Brandi Arellano a 60 y.o.femalewith a hx of CAD s/p remote CABG, ESRD on HD, PVDs/p right BKA, HTN, HLD, DMand recent diagnosis of bladder cancer, s/p recent surgical resectionof tumor,who is being seen today for the evaluation ofabnormal troponinat the request of Dr. Grace Blight, ED physician.   History of Present Illness:  Pt was admitted last month for bladder mass and was seen in consultation by Dr. Radford Pax on 02/11/17 for preoperative evaluation/ risk assessment. Pt hasa h/o CABG in 2012 but had not been studied since that time. She endorsed resting CP when getting emotionally upset and also with poor functional status (unable to ambulate due to lack of leg prosthesis). As part of her pre-op w/u, she was ordered to undergo pharmacological NST, which was interpreted as a high risk study with ischemia in the inferolateral wall from the base to the apex. EF was estimated at 29%. Subsequent LHC by Dr. Saunders Revel on 02/11/17 showed severenative coronary artery disease, including heavily calcified and diffusely diseased LAD with distal occlusion, chronic total occlusion of the ostial LCx, and chronic total occlusion of ostial RCA.She was noted to have a patent LIMA to distal LAD with 50% ostial stenosis as well as patent SVG-OM and SVG-RCA. Both grafts supplies small, diffusely diseased vessels. There were no good targets for PCI. Dr. Saunders Revel recommended continued medical therapy. Mild to moderate AS was also noted. She was continued on ASA, BB and statin and cleared for urologic surgery. On 03/01/2017, she underwent transurethral resection of a large bladder tumor from the bladder neck. Final pathology revealed squamous cell carcinoma. Oncology consulted and felt  minimal role for chemo (aggressive histology known to be relatively chemoresistant). Planisto proceed with curative intent surgery (tentatively scheduled in Jan. 2019).  Pt now presents to the ED with complaint of abdominalpain x 1 daywith associated n/v. Pain located in epigastric region. She denies any chest pain. No dyspnea.Last HD session was on 04/01/17. She is due for session today. SCr is 5.10. K is WNL at 4.1. BP elevated in ED in the 784O-962X systolic and 528U diastolic. Glucose also high at 294. POC troponin is 8.52. Her last checked tropoinin and only troponin on file is from 11/11/16 which was 0.05.EKG shows incomplete LBBB with LVH.  Repeat 2nd POC troponin is now >30.   Past Medical History:  Diagnosis Date  . Anemia   . Bladder mass 02/09/2017  . CAD (coronary artery disease)    CABG  . CHF (congestive heart failure) (HCC)    Acute on chronic diastolic  . Complication of anesthesia   . Diabetes mellitus without complication (HCC)    Type II  . DVT (deep venous thrombosis) ([place])   . ESRD (end stage renal disease) ([place])    HD M-W-F  . Gangrene of lower extremity (HCC)    RLE  . GERD (gastroesophageal reflux disease)   . Hx of AKA (above knee amputation), right ([place])   . Hyperlipidemia   . Hypertension   . Hypothyroidism   . PONV (postoperative nausea and vomiting)   . Stroke [facility])    "light stroke" no deficits  . Thrombocytopenia ([place])   . Thyroid disease    Abnormal Thyroid function Test  . UTI (lower urinary tract infection)     Past Surgical History:  Procedure Laterality Date  . ABDOMINAL ANGIOGRAM  12/26/2013   Procedure: ABDOMINAL ANGIOGRAM;  Surgeon: Serafina Daubenspeck, MD;  Location: [facility] CATH LAB;  Service: Cardiovascular;;  . AMPUTATION Right 02/01/2014   Procedure: Right Below Knee Amputation;  Surgeon: Newt Minion, MD;  Location: [place];  Service: Orthopedics;  Laterality: Right;  . AMPUTATION Right 03/24/2014   Procedure: AMPUTATION ABOVE  KNEE;  Surgeon: Newt Minion, MD;  Location: [place];  Service: Orthopedics;  Laterality: Right;  . ARCH AORTOGRAM  12/26/2013   Procedure: ARCH AORTOGRAM;  Surgeon: Serafina Craighead, MD;  Location: [facility] CATH LAB;  Service: Cardiovascular;;  . AXILLARY ARTERY - BRACHIAL ARTERY BYPASS GRAFT  11/07/2013   At [place]     Bilateral  . CHOLECYSTECTOMY    . CORONARY ARTERY BYPASS GRAFT  2012   in [place], [place] W/ RETROGRADES Left 03/01/2017   Procedure: CYSTOSCOPY WITH RETROGRADE PYELOGRAM;  Surgeon: Irine Seal, MD;  Location: [facility];  Service: Urology;  Laterality: Left;  . FOOT AMPUTATION Bilateral   . HARDWARE REMOVAL Right 07/19/2014   Procedure: Removal Deep Hardware Right Femur;  Surgeon: Newt Minion, MD;  Location: [place];  Service: Orthopedics;  Laterality: Right;  Six screws and one condylar plate removed   . hemodialysis catheter    . INSERTION OF DIALYSIS CATHETER Right 02/07/2017   Procedure: INSERTION OF DIALYSIS CATHETER;  Surgeon: Rosetta Posner, MD;  Location: [place];  Service: Vascular;  Laterality: Right;  . LEFT HEART CATH AND CORS/GRAFTS ANGIOGRAPHY N/A 02/11/2017   Procedure: LEFT HEART CATH AND CORS/GRAFTS ANGIOGRAPHY;  Surgeon: Nelva Bush, MD;  Location: [place] CV LAB;  Service: Cardiovascular;  Laterality: N/A;  . LOWER EXTREMITY ANGIOGRAM N/A 12/26/2013   Procedure: LOWER EXTREMITY ANGIOGRAM;  Surgeon: Serafina Abadi, MD;  Location[facility] CATH LAB;  Service: Cardiovascular;  Laterality: N/A;  . TRANSURETHRAL RESECTION OF BLADDER TUMOR N/A 03/01/2017   Procedure: TRANSURETHRAL RESECTION OF LARGE BLADDER TUMOR (TURBT)FROM BLADDER NECK;  Surgeon: Irine Seal, MD;  Location: [facility];  Service: Urology;  Laterality: N/A;  . UPPER EXTREMITY ANGIOGRAM Right 12/26/2013   Procedure: UPPER EXTREMITY ANGIOGRAM;  Surgeon: Serafina Ortner, MD;  Location: [facility] CATH LAB;  Service: Cardiovascular;  Laterality: Right;     Home Medications:    Prior to Admission medications   Medication Sig Start Date End Date Taking? Authorizing Provider  amLODipine (NORVASC) 5 MG tablet Take 5 mg by mouth daily.    [provider]  aspirin EC 81 MG tablet Take 81 mg by mouth daily.     [provider]  atorvastatin (LIPITOR) 40 MG tablet Take 40 mg by mouth every evening.     [provider]  lanthanum (FOSRENOL) 1000 MG chewable tablet Chew 1,000 mg by mouth See admin instructions. Pt to take 1 tablet with meals three times daily and 1 tablet with snacks    [provider]  levothyroxine (SYNTHROID, LEVOTHROID) 25 MCG tablet Take 25 mcg by mouth daily before breakfast.    [provider]  metoprolol tartrate (LOPRESSOR) 25 MG tablet Take 1 tablet (25 mg total) by mouth 2 (two) times daily. 03/15/17   Shirley, [place], DO  senna-docusate (SENOKOT-S) 8.6-50 MG tablet Take 1 tablet by mouth at bedtime as needed for mild constipation. 03/15/17   Shirley, [place], DO  thiamine 100 MG tablet Take 1 tablet (100 mg total) by mouth daily. 03/16/17   Enid Derry,  [place], DO    Inpatient Medications: Scheduled Meds: . heparin  4,000 Units Dialysis Once in dialysis  . lidocaine (PF)       Continuous Infusions: . sodium chloride    . sodium chloride     PRN Meds: sodium chloride, sodium chloride, alteplase, heparin, lidocaine (PF), lidocaine-prilocaine, pentafluoroprop-tetrafluoroeth  Allergies:    Allergies  Allergen Reactions  . Diphenhydramine Nausea And Vomiting  . Tramadol Hcl Nausea And Vomiting  . Morphine And Related Nausea And Vomiting    Social History:   Social History   Socioeconomic History  . Marital status: Divorced    Spouse name: Not on file  . Number of children: Not on file  . Years of education: Not on file  . Highest education level: Not on file  Social Needs  . Financial resource strain: Not on file  . Food insecurity - worry: Not on file  . Food insecurity - inability: Not on  file  . Transportation needs - medical: Not on file  . Transportation needs - non-medical: Not on file  Occupational History  . Not on file  Tobacco Use  . Smoking status: Former Smoker    Years: 30.00    Types: Cigarettes  . Smokeless tobacco: Never Used  . Tobacco comment: quit in 2002  Substance and Sexual Activity  . Alcohol use: No  . Drug use: No  . Sexual activity: Not on file  Other Topics Concern  . Not on file  Social History Narrative   Pt currently living with her brother.   Independent w/ transfers bed>wheelchair    Family History:    Family History  Adopted: Yes  Problem Relation Age of Onset  . Hypertension Father   . Heart disease Father        Coronary Artery Disease     ROS:  Please see the history of present illness.  ROS  All other ROS reviewed and negative.     Physical Exam/Data:   Vitals:   04/04/17 1330 04/04/17 1345 04/04/17 1353 04/04/17 1355  BP: (!) 167/100  (!) 164/103   Pulse: (!) 102 (!) 102  87  Resp: (!) 23 (!) 29  (!) 21  Temp:      TempSrc:      SpO2: 91% 91%  94%  Weight:      Height:       No intake or output data in the 24 hours ending 04/04/17 1437 Filed Weights   04/04/17 0941  Weight: 139 lb (63 kg)   Body mass index is 21.77 kg/m.  General:  No acute distress. Ill appearing  HEENT: normal Lymph: no adenopathy Neck: no JVD Endocrine:  No thryomegaly Vascular: No carotid bruits; FA pulses 2+ bilaterally without bruits  Cardiac:  normal S1, S2; RRR; no murmur  Lungs:  clear to auscultation bilaterally, no wheezing, rhonchi or rales  Abd: soft, nontender, no hepatomegaly  Ext: no edema Musculoskeletal:  No deformities, BUE and BLE strength normal and equal Skin: warm and dry  Neuro:  CNs 2-12 intact, no focal abnormalities noted Psych:  Normal affect   EKG:  The EKG was personally reviewed and demonstrates:  Incomplete LBBB, LVH Telemetry:  Telemetry was personally reviewed and demonstrates:  Sinus  tach  Relevant CV Studies: LHC 02/11/17 Procedures   LEFT HEART CATH AND CORS/GRAFTS ANGIOGRAPHY  Conclusion   Conclusions: 1. Severe native coronary artery disease, including heavily calcified and diffusely diseased LAD with distal occlusion, chronic total occlusion of the  ostial LCx, and chronic total occlusion of ostial RCA. 2. Patent LIMA to distal LAD with 50% ostial stenosis. 3. Patent SVG to OM and SVG to RCA. Both grafts supplies small, diffusely diseased vessels. 4. Normal left ventricular filling pressure. 5. Mild to moderate aortic stenosis.  Recommendations: 1. No good targets for percutaneous intervention. Recommend medical therapy. 2. Aggressive secondary prevention.     Laboratory Data:  Chemistry Recent Labs  Lab 04/04/17 1021 04/04/17 1036  NA 140 139  K 4.1 4.1  CL 99* 100*  CO2 27  --   GLUCOSE 281* 294*  BUN 33* 37*  CREATININE 5.10* 5.10*  CALCIUM 9.7  --   GFRNONAA 8*  --   GFRAA 10*  --   ANIONGAP 14  --     Recent Labs  Lab 04/04/17 1021  PROT 8.2*  ALBUMIN 3.5  AST 42*  ALT 11*  ALKPHOS 75  BILITOT 0.6   Hematology Recent Labs  Lab 04/04/17 1021 04/04/17 1036 04/04/17 1349  WBC 11.3*  --  11.1*  RBC 3.19*  --  3.48*  HGB 9.7* 10.9* 10.5*  HCT 30.4* 32.0* 33.5*  MCV 95.3  --  96.3  MCH 30.4  --  30.2  MCHC 31.9  --  31.3  RDW 17.9*  --  18.1*  PLT 167  --  164   Cardiac EnzymesNo results for input(s): TROPONINI in the last 168 hours.  Recent Labs  Lab 04/04/17 1033 04/04/17 1400  TROPIPOC 8.52* >30.00*    BNPNo results for input(s): BNP, PROBNP in the last 168 hours.  DDimer No results for input(s): DDIMER in the last 168 hours.  Radiology/Studies:  Dg Chest Portable 1 View  Result Date: 04/04/2017 CLINICAL DATA:  Hypoxia. EXAM: PORTABLE CHEST 1 VIEW COMPARISON:  Radiograph of February 07, 2017. FINDINGS: Stable cardiomegaly with central pulmonary vascular congestion. Atherosclerosis of thoracic aorta is  noted. Bilateral pulmonary edema is noted with right worse than left. No pneumothorax or pleural effusion is noted. Right internal jugular dialysis catheter is unchanged in position. Sternotomy wires are noted. Bony thorax is unremarkable. IMPRESSION: Bilateral pulmonary edema is noted, right worse than left. Aortic atherosclerosis. Electronically Signed   By: Marijo Conception, M.D.   On: 04/04/2017 10:48    Assessment and Plan:   1. MI possibly type 2/CAD:InitialPOC troponin abnormal at 8.52.2nd POC troponin is now >30. EKG shows incomplete LBBB and LVH. Known CAD with CABG in 2012. Recent LHC 02/11/17 with patent grafts but 50% ostial LIMA graft disease to LAD. No PCIat the time. Medical therapy recommendedat that time.Pt denies recent CP but main complaint is abdominal/epigastric pain with associated nausea and vomiting. She is a diabetic and BG in ED is elevated at 2.94.Given history, EKG and degree of troponin elevation, she may need a repeat LHC to ensure no changes/ down grafts, however also ? Intraabdominal process. We will start IV heparin and will monitor for now. Continue ASA, metoprolol and Lipitor. Possible LHC later this week.    2. Aortic Stenosis: mild to moderate on recent cath and echo.   3. ESRD: on HD. Per nephrology.   4. UM:PNTIRW controlled. BG in the upper 200s. IM to manage.   5. ERX:VQMGQQPY. Will order home metoprolol and amlodipine.   6. PPJ:KDTOIZTI statin therapy w/ Lipitor.    For questions or updates, please contact [place] Please consult [email or web address] for contact info under Cardiology/STEMI.   Signed, Lyda Jester, PA-C  04/04/2017 2:37 PM   Personally  seen and examined. Agree with above.  61 year old female with multiple comorbidities including CAD status post CABG with recent cardiac cath resulting in medical management, newly diagnosed squamous cell carcinoma of the bladder awaiting surgery, moderate aortic stenosis, end-stage  renal disease on hemodialysis, poorly controlled diabetes, hypertension, hyperlipidemia who was admitted with lethargy, nausea vomiting and intra-abdominal discomfort.  Her point-of-care troponin was elevated at 8 and a second point-of-care troponin was then elevated at 30.  Her EKG shows diffuse ST segment depression, accentuated from prior EKG which also showed diffuse ST segment depression.  She has had a recent cardiac catheterization given her known coronary disease status post CABG with 50% ostial LIMA graft to LAD.  No PCI.  Please see above for further details.  I interviewed her in dialysis and she appeared quite ill, pale, lethargic.  She is denying any chest discomfort.  Her bed smelled of feces.  She does not appear to be short of breath.  Heart regular rate and rhythm with 2/6 systolic aortic stenosis murmur, lungs appear clear without any wheezes, 1+ pitting edema bilateral lower extremities.  Chest x-ray personally reviewed shows right greater than left pulmonary edema which interestingly appears fairly unchanged from her prior x-ray.  Echocardiogram shows EF of 45-50% with moderate aortic stenosis.  Assessment: Myocardial infarction, possibly type II Uncontrolled diabetes End-stage renal disease on hemodialysis Essential hypertension Squamous cell carcinoma of bladder Hyperlipidemia  - She is currently denying any anginal symptoms and she presented with nausea and vomiting as well as abdominal discomfort.  She does have aortoiliac atherosclerosis on CT scan previously reviewed.  Could she be having an intra-abdominal process that is then leading to excessive strain, possible significant demand ischemia.  She was laying in her own feces.  Seems a bit lethargic.  Point-of-care troponin is greater than 30.  Await troponin I. -For now I would treat her with IV heparin.  Watch for any signs of bleeding.  Since she is not having any active chest discomfort, I would like to wait on pursuing  diagnostic cardiac catheterization until possible abdominal issues are resolved.  If she begins to have anginal symptoms, we could consider diagnostic cardiac catheterization.  Remember she just had this done on February 11, 2017.  This resulted in medical management. -Given her highly elevated troponin and multiple comorbidities, she is at high risk for cardiovascular mortality.  We will continue to follow with you.  Candee Furbish, MD

## 2017-04-04 NOTE — Procedures (Signed)
   I was present at this dialysis session, have reviewed the session itself and made  appropriate changes [name] MD [place] pager [phone]   04/04/2017, 4:51 PM

## 2017-04-05 ENCOUNTER — Inpatient Hospital Stay (HOSPITAL_COMMUNITY): Payer: Medicaid Other

## 2017-04-05 ENCOUNTER — Other Ambulatory Visit: Payer: Self-pay

## 2017-04-05 DIAGNOSIS — E1122 Type 2 diabetes mellitus with diabetic chronic kidney disease: Secondary | ICD-10-CM

## 2017-04-05 DIAGNOSIS — I2510 Atherosclerotic heart disease of native coronary artery without angina pectoris: Secondary | ICD-10-CM

## 2017-04-05 DIAGNOSIS — R112 Nausea with vomiting, unspecified: Secondary | ICD-10-CM

## 2017-04-05 DIAGNOSIS — I214 Non-ST elevation (NSTEMI) myocardial infarction: Principal | ICD-10-CM

## 2017-04-05 DIAGNOSIS — R1013 Epigastric pain: Secondary | ICD-10-CM

## 2017-04-05 LAB — CBC
HCT: 34.3 % — ABNORMAL LOW (ref 36.0–46.0)
HEMOGLOBIN: 10.6 g/dL — AB (ref 12.0–15.0)
MCH: 29.5 pg (ref 26.0–34.0)
MCHC: 30.9 g/dL (ref 30.0–36.0)
MCV: 95.5 fL (ref 78.0–100.0)
PLATELETS: 199 10*3/uL (ref 150–400)
RBC: 3.59 MIL/uL — ABNORMAL LOW (ref 3.87–5.11)
RDW: 17.9 % — ABNORMAL HIGH (ref 11.5–15.5)
WBC: 13.1 10*3/uL — ABNORMAL HIGH (ref 4.0–10.5)

## 2017-04-05 LAB — RENAL FUNCTION PANEL
ANION GAP: 15 (ref 5–15)
Albumin: 3.5 g/dL (ref 3.5–5.0)
BUN: 24 mg/dL — ABNORMAL HIGH (ref 6–20)
CALCIUM: 8.8 mg/dL — AB (ref 8.9–10.3)
CO2: 23 mmol/L (ref 22–32)
Chloride: 98 mmol/L — ABNORMAL LOW (ref 101–111)
Creatinine, Ser: 3.8 mg/dL — ABNORMAL HIGH (ref 0.44–1.00)
GFR calc Af Amer: 14 mL/min — ABNORMAL LOW (ref >60)
GFR calc non Af Amer: 12 mL/min — ABNORMAL LOW (ref >60)
GLUCOSE: 203 mg/dL — AB (ref 65–99)
Phosphorus: 5 mg/dL — ABNORMAL HIGH (ref 2.5–4.6)
Potassium: 4 mmol/L (ref 3.5–5.1)
SODIUM: 136 mmol/L (ref 135–145)

## 2017-04-05 LAB — HEPARIN LEVEL (UNFRACTIONATED)
Heparin Unfractionated: 0.28 IU/mL — ABNORMAL LOW (ref 0.30–0.70)
Heparin Unfractionated: 0.31 IU/mL (ref 0.30–0.70)

## 2017-04-05 LAB — TSH: TSH: 5.737 u[IU]/mL — ABNORMAL HIGH (ref 0.350–4.500)

## 2017-04-05 LAB — ECHOCARDIOGRAM COMPLETE
Height: 67 in
Weight: 1856 oz

## 2017-04-05 LAB — GLUCOSE, CAPILLARY
GLUCOSE-CAPILLARY: 203 mg/dL — AB (ref 65–99)
GLUCOSE-CAPILLARY: 124 mg/dL — AB (ref 65–99)
GLUCOSE-CAPILLARY: 158 mg/dL — AB (ref 65–99)
Glucose-Capillary: 197 mg/dL — ABNORMAL HIGH (ref 65–99)

## 2017-04-05 LAB — LIPASE, BLOOD: Lipase: 56 U/L — ABNORMAL HIGH (ref 11–51)

## 2017-04-05 LAB — T4, FREE: FREE T4: 0.97 ng/dL (ref 0.61–1.12)

## 2017-04-05 LAB — HEPATITIS B SURFACE ANTIGEN: Hepatitis B Surface Ag: NEGATIVE

## 2017-04-05 LAB — CK TOTAL AND CKMB (NOT AT ARMC)
CK, MB: 44.5 ng/mL — AB (ref 0.5–5.0)
Relative Index: 8.4 — ABNORMAL HIGH (ref 0.0–2.5)
Total CK: 532 U/L — ABNORMAL HIGH (ref 38–234)

## 2017-04-05 LAB — TROPONIN I: TROPONIN I: 60.15 ng/mL — AB (ref <0.03)

## 2017-04-05 MED ORDER — DEXTROSE 5 % IV SOLN
1.0000 g | INTRAVENOUS | Status: DC
  Administered 2017-04-05 – 2017-04-09 (×5): 1 g via INTRAVENOUS
  Filled 2017-04-05 (×5): qty 10

## 2017-04-05 MED ORDER — SODIUM CHLORIDE 0.9 % IV BOLUS (SEPSIS)
500.0000 mL | Freq: Once | INTRAVENOUS | Status: AC
  Administered 2017-04-05: 500 mL via INTRAVENOUS

## 2017-04-05 MED ORDER — METOCLOPRAMIDE HCL 5 MG/ML IJ SOLN
5.0000 mg | Freq: Two times a day (BID) | INTRAMUSCULAR | Status: DC
  Administered 2017-04-05 – 2017-04-11 (×5): 5 mg via INTRAVENOUS
  Filled 2017-04-05 (×7): qty 2

## 2017-04-05 MED ORDER — CLOPIDOGREL BISULFATE 75 MG PO TABS
75.0000 mg | ORAL_TABLET | Freq: Every day | ORAL | Status: DC
  Administered 2017-04-05: 75 mg via ORAL
  Filled 2017-04-05: qty 1

## 2017-04-05 MED ORDER — SODIUM CHLORIDE 0.9 % IV BOLUS (SEPSIS)
250.0000 mL | Freq: Once | INTRAVENOUS | Status: AC
  Administered 2017-04-05: 250 mL via INTRAVENOUS

## 2017-04-05 MED ORDER — METOCLOPRAMIDE HCL 5 MG/ML IJ SOLN: 5.0000 mg | Freq: Three times a day (TID) | INTRAMUSCULAR | Status: DC

## 2017-04-05 MED ORDER — ORAL CARE MOUTH RINSE
15.0000 mL | Freq: Two times a day (BID) | OROMUCOSAL | Status: DC
  Administered 2017-04-05 – 2017-04-11 (×6): 15 mL via OROMUCOSAL

## 2017-04-05 NOTE — Progress Notes (Signed)
Progress Note  Patient Name: Brandi Arellano Date of Encounter: 04/05/2017  Primary Cardiologist: No primary care provider on file.  Dr. Radford Pax  Subjective   Experienced hypotension post dialysis overnight.  Fluid boluses were administered.  Denies any chest pain.  Her symptoms are only mid abdominal discomfort with nausea and vomiting.  She states that she saw someone vomiting at hemodialysis session and that this made her feel sick and she has been throwing up.  Denying any fevers.  Inpatient Medications    Scheduled Meds: . amLODipine  5 mg Oral Daily  . aspirin EC  81 mg Oral Daily  . atorvastatin  40 mg Oral QPM  . docusate sodium  100 mg Oral BID  . [START ON 04/06/2017] doxercalciferol  2 mcg Intravenous Q M,W,F-HD  . heparin  4,000 Units Dialysis Once in dialysis  . insulin aspart  0-5 Units Subcutaneous QHS  . insulin aspart  0-9 Units Subcutaneous TID WC  . lanthanum  1,000 mg Oral TID WC  . levothyroxine  25 mcg Oral QAC breakfast  . mouth rinse  15 mL Mouth Rinse BID  . metoprolol tartrate  25 mg Oral BID  . senna-docusate  1 tablet Oral QHS  . sodium chloride flush  3 mL Intravenous Q12H  . sodium chloride flush  3 mL Intravenous Q12H  . thiamine  100 mg Oral Daily   Continuous Infusions: . sodium chloride 100 mL (04/05/17 0146)  . sodium chloride    . sodium chloride    . heparin 900 Units/hr (04/05/17 0347)   PRN Meds: sodium chloride, sodium chloride, sodium chloride, alteplase, heparin, lanthanum, lidocaine (PF), lidocaine-prilocaine, nitroGLYCERIN, ondansetron **OR** ondansetron (ZOFRAN) IV, oxyCODONE, pentafluoroprop-tetrafluoroeth, sodium chloride flush   Vital Signs    Vitals:   04/05/17 0300 04/05/17 0315 04/05/17 0400 04/05/17 0700  BP: (!) 63/47  (!) 89/61 (!) 153/95  Pulse: 62 62 62 71  Resp: 14 15 15 10   Temp: 97.9 F (36.6 C)     TempSrc: Oral     SpO2: (!) 89% (!) 89% 99% 99%  Weight:   116 lb (52.6 kg)   Height:         Intake/Output Summary (Last 24 hours) at 04/05/2017 0728 Last data filed at 04/05/2017 0600 Gross per 24 hour  Intake 759.13 ml  Output 3000 ml  Net -2240.87 ml   Filed Weights   04/04/17 1830 04/04/17 2000 04/05/17 0400  Weight: 132 lb 4.4 oz (60 kg) 115 lb 15.4 oz (52.6 kg) 116 lb (52.6 kg)    Telemetry    No adverse arrhythmias, no ventricular tachycardia, intraventricular conduction delay noted. - Personally Reviewed  ECG    Left bundle branch block, LVH, ST segment depression noted.- Personally Reviewed  Physical Exam   GEN:  Ill-appearing, pale, in bed, conversant this morning, appears somewhat better than yesterday but still having nausea. Neck: No JVD, dialysis catheter Cardiac: RRR, 2/6 systolic murmur, no rubs, or gallops.  Respiratory:  Minimal crackles at bases bilaterally  GI: Soft, nontender, non-distended  MS: No edema; No deformity. Neuro:  Nonfocal  Psych: Normal affect   Labs    Chemistry Recent Labs  Lab 04/04/17 1021 04/04/17 1036 04/04/17 1430  NA 140 139 138  K 4.1 4.1 4.2  CL 99* 100* 98*  CO2 27  --  26  GLUCOSE 281* 294* 257*  BUN 33* 37* 37*  CREATININE 5.10* 5.10* 5.31*  CALCIUM 9.7  --  9.3  PROT 8.2*  --   --  ALBUMIN 3.5  --  3.2*  AST 42*  --   --   ALT 11*  --   --   ALKPHOS 75  --   --   BILITOT 0.6  --   --   GFRNONAA 8*  --  8*  GFRAA 10*  --  9*  ANIONGAP 14  --  14     Hematology Recent Labs  Lab 04/04/17 1349 04/04/17 1430 04/05/17 0648  WBC 11.1* 11.4* 13.1*  RBC 3.48* 3.31* 3.59*  HGB 10.5* 9.9* 10.6*  HCT 33.5* 31.8* 34.3*  MCV 96.3 96.1 95.5  MCH 30.2 29.9 29.5  MCHC 31.3 31.1 30.9  RDW 18.1* 18.0* 17.9*  PLT 164 187 199    Cardiac Enzymes Recent Labs  Lab 04/04/17 1910 04/05/17 0026  TROPONINI >65.00* >65.00*    Recent Labs  Lab 04/04/17 1033 04/04/17 1400  TROPIPOC 8.52* >30.00*     BNPNo results for input(s): BNP, PROBNP in the last 168 hours.   DDimer No results for  input(s): DDIMER in the last 168 hours.   Radiology    Abd 1 View (kub)  Result Date: 04/04/2017 CLINICAL DATA:  61 year old female with abdominal pain, nausea vomiting. EXAM: ABDOMEN - 1 VIEW COMPARISON:  CT of the abdomen pelvis dated 02/06/2017 FINDINGS: There is no bowel dilatation or evidence of obstruction. No free air identified on the provided images. There is extensive atherosclerotic calcification of the aorta and mesenteric vasculature. Large amount of stool noted in the rectal vault. The osseous structures and soft tissues appear unremarkable. IMPRESSION: Large amount of stool in the rectal vault may represent fecal impaction. No evidence of obstruction. Electronically Signed   By: Anner Crete M.D.   On: 04/04/2017 21:49   Dg Chest Portable 1 View  Result Date: 04/04/2017 CLINICAL DATA:  Hypoxia. EXAM: PORTABLE CHEST 1 VIEW COMPARISON:  Radiograph of February 07, 2017. FINDINGS: Stable cardiomegaly with central pulmonary vascular congestion. Atherosclerosis of thoracic aorta is noted. Bilateral pulmonary edema is noted with right worse than left. No pneumothorax or pleural effusion is noted. Right internal jugular dialysis catheter is unchanged in position. Sternotomy wires are noted. Bony thorax is unremarkable. IMPRESSION: Bilateral pulmonary edema is noted, right worse than left. Aortic atherosclerosis. Electronically Signed   By: Marijo Conception, M.D.   On: 04/04/2017 10:48    Cardiac Studies   [facility] 02/11/17 Procedures   LEFT HEART CATH AND CORS/GRAFTS ANGIOGRAPHY  Conclusion   Conclusions: 1. Severe native coronary artery disease, including heavily calcified and diffusely diseased LAD with distal occlusion, chronic total occlusion of the ostial LCx, and chronic total occlusion of ostial RCA. 2. Patent LIMA to distal LAD with 50% ostial stenosis. 3. Patent SVG to OM and SVG to RCA. Both grafts supplies small, diffusely diseased vessels. 4. Normal left ventricular  filling pressure. 5. Mild to moderate aortic stenosis.  Recommendations: 1. No good targets for percutaneous intervention. Recommend medical therapy. 2. Aggressive secondary prevention.      Patient Profile     61 y.o. female with coronary artery disease status post CABG with cardiac catheterization performed last month showing 50% ostial stenosis of LIMA to LAD with patent SVG to OM, SVG to RCA but both graft supply small diffusely diseased vessels with mild to moderate aortic stenosis as well.  Her native vessels are heavily calcified and diffusely diseased.  Currently having non-ST elevation myocardial infarction with symptoms of nausea and vomiting.  Denying any active chest pain.  Assessment &  Plan    Non-ST elevation myocardial infarction -Troponin has vastly increased to greater than 65.  She certainly could have occluded 1 of her bypass grafts or native vessels.  Interestingly, she is not having any chest pain.  She did have chest discomfort prior to her bypass surgery she states.  She has not felt any escalation of anginal symptoms at home.  Her symptoms currently are nausea, vomiting.  She states that 1 of her dialysis mates was throwing up and this made her feel nauseous and she began to throw up as well.  Perhaps she has a inflammatory response that has caused plaque rupture from potentially a gastroenteritis.  Or, this MI is being manifested early by mid abdominal discomfort and nausea and vomiting. -Continue with IV heparin.  Beta-blocker as tolerated however she was hypotensive post dialysis. -Awaiting repeat echocardiogram. -I think it would be a good idea to study her with another diagnostic cardiac catheterization to see if there is anything new that has developed given her markedly elevated troponin.  Perhaps this will take place tomorrow.  Her abdomen seems soft, does not seem to have any intra-abdominal process.  Mild to moderate aortic stenosis -Evaluate with  echocardiogram.  Should be of no clinical consequence.  Murmur appreciated on exam.  End-stage renal disease -On hemodialysis, last session 04/04/17.  Post dialysis hypotension, fluid boluses administered.  Chest x-ray originally appeared to have a degree of pulmonary edema.  She is currently breathing well.  Minimal crackles at bases.  Diabetes with hypertension -Per primary team.  Continue with aggressive secondary prevention, metoprolol, statin with Lipitor, I will add clopidogrel.  For questions or updates, please contact [place] Please consult [email or web address] for contact info under Cardiology/STEMI.      Signed, Candee Furbish, MD  04/05/2017, 7:28 AM

## 2017-04-05 NOTE — Progress Notes (Signed)
CRITICAL VALUE ALERT  Critical Value: 67  Date & Time Notied:  04/04/17  Provider Notified: FMTS notified   Orders Received/Actions taken: No new orders given

## 2017-04-05 NOTE — Progress Notes (Signed)
ANTICOAGULATION CONSULT NOTE - Follow Up Consult  Pharmacy Consult for Heparin Indication: chest pain/ACS  Allergies  Allergen Reactions  . Diphenhydramine Nausea And Vomiting  . Tramadol Hcl Nausea And Vomiting  . Morphine And Related Nausea And Vomiting    Patient Measurements: Height: 5\' 7"  (170.2 cm) Weight: 116 lb (52.6 kg) IBW/kg (Calculated) : 61.6  Vital Signs: Temp: 97.7 F (36.5 C) (12/25 0838) Temp Source: Oral (12/25 0838) BP: 141/80 (12/25 1200) Pulse Rate: 62 (12/25 1200)  Labs: Recent Labs    04/04/17 1036 04/04/17 1349 04/04/17 1430 04/04/17 1910 04/05/17 0026 04/05/17 0648 04/05/17 1048  HGB 10.9* 10.5* 9.9*  --   --  10.6*  --   HCT 32.0* 33.5* 31.8*  --   --  34.3*  --   PLT  --  164 187  --   --  199  --   HEPARINUNFRC  --   --   --   --  0.28*  --  0.31  CREATININE 5.10*  --  5.31*  --   --  3.80*  --   TROPONINI  --   --   --  >65.00* >65.00*  --   --     Estimated Creatinine Clearance: 13.1 mL/min (A) (by C-G formula based on SCr of 3.8 mg/dL (H)).  Assessment: 61 y.o. female continues on heparin for NSTEMI Heparin level therapeutic  Goal of Therapy:  Heparin level 0.3-0.7 units/ml Monitor platelets by anticoagulation protocol: Yes   Plan:  Continue heparin at 900 units / hr Follow up AM labs  Thank you Anette Guarneri, PharmD [phone]  04/05/2017,12:24 PM

## 2017-04-05 NOTE — Progress Notes (Signed)
Family Medicine Teaching Service Daily Progress Note Intern Pager: [phone]  Patient name: Brandi Arellano Medical record number: [id number] Date of birth: [birth year] Age: 61 y.o. Gender: female  Primary Care Provider: Kerin Perna, NP Consultants: nephrology, cardiology Code Status: partial DNI  [name]:   Assessment and Plan: Brandi Arellano is a 61 y.o. female presenting with nausea and vomiting, found to be hypoxic with bilateral pleural effusions. PMH is significant for ESRD, HTN, T2DM, invasive bladder cancer  Acute Hypoxia- Sats at 100% on 3 Liters, no previous oxygen requirement. CXR showing bilateral pulmonary edema, L>R. Mild leukocytosis on admission, patient is afebrile, no signs of pneumonia on CXR. Hypoxia may be secondary to acute coronary event. Patient did emphasize on admission she is DNI.  -Will obtain a 2 view xray following dialysis to ensure resolution of this issue -continuous pulse ox and cardiac monitoring -supplemental O2 as needed to keep sats > 90%  NSTEMI - Troponin 8.52> 30>65 Hx of CAD. EKG with LBBB.  Cardiology was consulted and started patient on heparin drip.  AM EKG similar to previous  -cardiology following, appreciate recommendations -continue heparin drip -Per cardiology adding clopidogrel. - Continue  Metoprolol and Lipitor  - Repeat ECHO per cardiology pending  - Cards another diagnostic cardiac catheterization possibly tomorrow  -Total CK to get an idea of  level of myocardial destruction  - prn nitro ordered  Pyuria: Suprapubic tenderness. Will treat for UTI  - Urine culture pending  - Ceftriaxone 1 gram daily  - Nurse order to change urine catheter   Nausea and vomiting- KUB with a large amount of stool in the rectal vault may represent fecal impaction. No evidence of obstruction.Also with known invasive bladder cancer that may be causing pain. - Will need an enema in the future given concern for  fecal impaction - will hold off as concern for vasovagal response given patient's tentative cardiac status  - Will give metoclopramide 5 mg BID, dose adjusted for kidney function  - Zofran as needed for nausea  - oxy IR 5 mg every  3 hour prn   ESRD on HD- got HD 12/23 and 12/24 -nephrology following -daily renal function panels  Invasive bladder cancer- chronic indwelling Foley in place -patient was to follow up w/ urology for total GUectomy in January  HTN-  Hypotensive overnight after dialysis with 3 fluid boluses, BP now elevated  - Will hold amlodipine for now  - continue metop  T2DM- last A1C 6.8 02/07/17. Not on any home meds. Glucose elevated on admission >250 -sensitive SSI -CBGs AC/HS  Hypothyroidism- stable, on synthroid -continue home synthroid -check TSH  HLD- stable, on lipitor -continue home medication  FEN/GI: renal/carb modified Prophylaxis: heparin  Disposition: Step Down  Subjective:  Patient feels miserable. She continues to have nausea and vomiting. She indicates having epigastric pain.    Objective: Temp:  [97.7 F (36.5 C)-99.5 F (37.5 C)] 97.7 F (36.5 C) (12/25 0838) Pulse Rate:  [62-109] 83 (12/25 0838) Resp:  [10-29] 17 (12/25 0838) BP: (63-176)/(36-108) 157/90 (12/25 0838) SpO2:  [89 %-100 %] 100 % (12/25 0838) Weight:  [115 lb 15.4 oz (52.6 kg)-139 lb (63 kg)] 116 lb (52.6 kg) (12/25 0400) Physical Exam: General: Middle aged female in mild distress  Cardiovascular: Murmur noted on exam,  Otherwise regular rate  Respiratory: CTAB,  Abdomen: No BS heard, epigastric tenderness noted  Extremities: No lower extremity edema noted   Laboratory: Recent Labs  Lab 04/04/17  1349 04/04/17 1430 04/05/17 0648  WBC 11.1* 11.4* 13.1*  HGB 10.5* 9.9* 10.6*  HCT 33.5* 31.8* 34.3*  PLT 164 187 199   Recent Labs  Lab 04/04/17 1021 04/04/17 1036 04/04/17 1430 04/05/17 0648  NA 140 139 138 136  K 4.1 4.1 4.2 4.0  CL 99* 100* 98*  98*  CO2 27  --  26 23  BUN 33* 37* 37* 24*  CREATININE 5.10* 5.10* 5.31* 3.80*  CALCIUM 9.7  --  9.3 8.8*  PROT 8.2*  --   --   --   BILITOT 0.6  --   --   --   ALKPHOS 75  --   --   --   ALT 11*  --   --   --   AST 42*  --   --   --   GLUCOSE 281* 294* 257* 203*     Imaging/Diagnostic Tests: Abd 1 View (kub)  Result Date: 04/04/2017 CLINICAL DATA:  61 year old female with abdominal pain, nausea vomiting. EXAM: ABDOMEN - 1 VIEW COMPARISON:  CT of the abdomen pelvis dated 02/06/2017 FINDINGS: There is no bowel dilatation or evidence of obstruction. No free air identified on the provided images. There is extensive atherosclerotic calcification of the aorta and mesenteric vasculature. Large amount of stool noted in the rectal vault. The osseous structures and soft tissues appear unremarkable. IMPRESSION: Large amount of stool in the rectal vault may represent fecal impaction. No evidence of obstruction. Electronically Signed   By: Anner Crete M.D.   On: 04/04/2017 21:49   Dg Chest Portable 1 View  Result Date: 04/04/2017 CLINICAL DATA:  Hypoxia. EXAM: PORTABLE CHEST 1 VIEW COMPARISON:  Radiograph of February 07, 2017. FINDINGS: Stable cardiomegaly with central pulmonary vascular congestion. Atherosclerosis of thoracic aorta is noted. Bilateral pulmonary edema is noted with right worse than left. No pneumothorax or pleural effusion is noted. Right internal jugular dialysis catheter is unchanged in position. Sternotomy wires are noted. Bony thorax is unremarkable. IMPRESSION: Bilateral pulmonary edema is noted, right worse than left. Aortic atherosclerosis. Electronically Signed   By: Marijo Conception, M.D.   On: 04/04/2017 10:48    Tonette Bihari, MD 04/05/2017, 9:33 AM PGY-3, [place] Intern pager: [phone], text pages welcome

## 2017-04-05 NOTE — Plan of Care (Signed)
Patient has been hypotensive several times through the night. After 3 fluid boluses patients blood pressure has begun to increase. Patient is  asymptomatic otherwise with the only complaint being that abdominal pain has subsided.

## 2017-04-05 NOTE — Progress Notes (Signed)
[facility] Progress Note  Subjective: 3L off w/ HD yest, doing better, peak trop 60  Vitals:   04/05/17 0830 04/05/17 0838 04/05/17 1000 04/05/17 1200  BP:  (!) 157/90 120/77 (!) 141/80  Pulse: 86 83 69 62  Resp: 10 17 18 17   Temp:  97.7 F (36.5 C)  98 F (36.7 C)  TempSrc:  Oral  Oral  SpO2: 98% 100% 98% 99%  Weight:      Height:        Inpatient medications: . aspirin EC  81 mg Oral Daily  . atorvastatin  40 mg Oral QPM  . clopidogrel  75 mg Oral Daily  . docusate sodium  100 mg Oral BID  . [START ON 04/06/2017] doxercalciferol  2 mcg Intravenous Q M,W,F-HD  . heparin  4,000 Units Dialysis Once in dialysis  . insulin aspart  0-5 Units Subcutaneous QHS  . insulin aspart  0-9 Units Subcutaneous TID WC  . lanthanum  1,000 mg Oral TID WC  . levothyroxine  25 mcg Oral QAC breakfast  . mouth rinse  15 mL Mouth Rinse BID  . metoCLOPramide (REGLAN) injection  5 mg Intravenous Q12H  . metoprolol tartrate  25 mg Oral BID  . senna-docusate  1 tablet Oral QHS  . sodium chloride flush  3 mL Intravenous Q12H  . sodium chloride flush  3 mL Intravenous Q12H  . thiamine  100 mg Oral Daily   . sodium chloride 100 mL (04/05/17 0146)  . sodium chloride    . sodium chloride    . cefTRIAXone (ROCEPHIN)  IV 1 g (04/05/17 1304)  . heparin 900 Units/hr (04/05/17 0347)   sodium chloride, sodium chloride, sodium chloride, alteplase, heparin, lanthanum, lidocaine (PF), lidocaine-prilocaine, nitroGLYCERIN, ondansetron **OR** ondansetron (ZOFRAN) IV, oxyCODONE, pentafluoroprop-tetrafluoroeth, sodium chloride flush  Exam: Alert, no distress No jvd Chest mostly , occ basilar rales RRR 2/6 sem Abd soft ntnd GU foley in place with leg bag Ext R AKA, LLE no edema R IJ TDC  CXR - 12/24 bilat pulm edema          - 12/25 no edema, no active disease  Dialysis: [facility] MWF 4h  EDW ?   Hep 4000   R IJ cath    160 NRe 400/Auto 1.5 Linear sodium UFP 4 -Hectorol 2 mcg IV TIW (no  recent PTH) -Mircera 150 mcg IV q 2 weeks (last dose 50 mcg IV 03/23/17 Last HGB 8.6 04/09/17) -home BP Rx > norvasc 10 qd/ metop 25 bid       Impression: 1. Acute MI / hx CABG  - per cards, recent cath showed patent grafts (sp CABG), severe native disease.  2. Pulm edema - resolved w HD yest 3. Abd pain - improving 4. ESRD usual HD is MWF. Had HD Sunday on holiday sched + extra HD yest 5. HTN - getting MTP 25 bid here , vol OK on exam 6. Anemia of CKD - will give darbe 100 on 12/26. Hb 10's.  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 Per family is on for surgery in late January.  8. CAD hx CABG  Plan - HD Wed, schedule around possible LHC   [name] MD [place] pager [phone]   04/05/2017, 1:33 PM   Recent Labs  Lab 04/04/17 1021 04/04/17 1036 04/04/17 1430 04/05/17 0648  NA 140 139 138 136  K 4.1 4.1 4.2 4.0  CL  99* 100* 98* 98*  CO2 27  --  26 23  GLUCOSE 281* 294* 257* 203*  BUN 33* 37* 37* 24*  CREATININE 5.10* 5.10* 5.31* 3.80*  CALCIUM 9.7  --  9.3 8.8*  PHOS  --   --  6.8* 5.0*   Recent Labs  Lab 04/04/17 1021 04/04/17 1430 04/05/17 0648  AST 42*  --   --   ALT 11*  --   --   ALKPHOS 75  --   --   BILITOT 0.6  --   --   PROT 8.2*  --   --   ALBUMIN 3.5 3.2* 3.5   Recent Labs  Lab 04/04/17 1349 04/04/17 1430 04/05/17 0648  WBC 11.1* 11.4* 13.1*  HGB 10.5* 9.9* 10.6*  HCT 33.5* 31.8* 34.3*  MCV 96.3 96.1 95.5  PLT 164 187 199   Iron/TIBC/Ferritin/ %Sat    Component Value Date/Time   IRON 123 02/16/2017 0900   TIBC 200 (L) 02/16/2017 0900   FERRITIN 1,388 (H) 02/16/2017 0900   IRONPCTSAT 61 (H) 02/16/2017 0900

## 2017-04-05 NOTE — Progress Notes (Signed)
ANTICOAGULATION CONSULT NOTE - Follow Up Consult  Pharmacy Consult for Heparin Indication: chest pain/ACS  Allergies  Allergen Reactions  . Diphenhydramine Nausea And Vomiting  . Tramadol Hcl Nausea And Vomiting  . Morphine And Related Nausea And Vomiting    Patient Measurements: Height: 5\' 7"  (170.2 cm) Weight: 115 lb 15.4 oz (52.6 kg) IBW/kg (Calculated) : 61.6  Vital Signs: Temp: 99.5 F (37.5 C) (12/24 2327) Temp Source: Oral (12/24 2327) BP: 134/75 (12/24 2327) Pulse Rate: 84 (12/24 2327)  Labs: Recent Labs    04/04/17 1021 04/04/17 1036 04/04/17 1349 04/04/17 1430 04/04/17 1910 04/05/17 0026  HGB 9.7* 10.9* 10.5* 9.9*  --   --   HCT 30.4* 32.0* 33.5* 31.8*  --   --   PLT 167  --  164 187  --   --   HEPARINUNFRC  --   --   --   --   --  0.28*  CREATININE 5.10* 5.10*  --  5.31*  --   --   TROPONINI  --   --   --   --  >65.00*  --     Estimated Creatinine Clearance: 9.4 mL/min (A) (by C-G formula based on SCr of 5.31 mg/dL (H)).  Assessment: 61 y.o. female with elevated troponins for heparin Goal of Therapy:  Heparin level 0.3-0.7 units/ml Monitor platelets by anticoagulation protocol: Yes   Plan:  Increase Heparin 900 units/hr Check heparin level in 8 hours.   Caryl Pina 04/05/2017,1:04 AM

## 2017-04-05 NOTE — H&P (View-Only) (Signed)
Progress Note  Patient Name: Brandi Arellano Date of Encounter: 04/05/2017  Primary Cardiologist: No primary care provider on file.  Dr. Radford Pax  Subjective   Experienced hypotension post dialysis overnight.  Fluid boluses were administered.  Denies any chest pain.  Her symptoms are only mid abdominal discomfort with nausea and vomiting.  She states that she saw someone vomiting at hemodialysis session and that this made her feel sick and she has been throwing up.  Denying any fevers.  Inpatient Medications    Scheduled Meds: . amLODipine  5 mg Oral Daily  . aspirin EC  81 mg Oral Daily  . atorvastatin  40 mg Oral QPM  . docusate sodium  100 mg Oral BID  . [START ON 04/06/2017] doxercalciferol  2 mcg Intravenous Q M,W,F-HD  . heparin  4,000 Units Dialysis Once in dialysis  . insulin aspart  0-5 Units Subcutaneous QHS  . insulin aspart  0-9 Units Subcutaneous TID WC  . lanthanum  1,000 mg Oral TID WC  . levothyroxine  25 mcg Oral QAC breakfast  . mouth rinse  15 mL Mouth Rinse BID  . metoprolol tartrate  25 mg Oral BID  . senna-docusate  1 tablet Oral QHS  . sodium chloride flush  3 mL Intravenous Q12H  . sodium chloride flush  3 mL Intravenous Q12H  . thiamine  100 mg Oral Daily   Continuous Infusions: . sodium chloride 100 mL (04/05/17 0146)  . sodium chloride    . sodium chloride    . heparin 900 Units/hr (04/05/17 0347)   PRN Meds: sodium chloride, sodium chloride, sodium chloride, alteplase, heparin, lanthanum, lidocaine (PF), lidocaine-prilocaine, nitroGLYCERIN, ondansetron **OR** ondansetron (ZOFRAN) IV, oxyCODONE, pentafluoroprop-tetrafluoroeth, sodium chloride flush   Vital Signs    Vitals:   04/05/17 0300 04/05/17 0315 04/05/17 0400 04/05/17 0700  BP: (!) 63/47  (!) 89/61 (!) 153/95  Pulse: 62 62 62 71  Resp: 14 15 15 10   Temp: 97.9 F (36.6 C)     TempSrc: Oral     SpO2: (!) 89% (!) 89% 99% 99%  Weight:   116 lb (52.6 kg)   Height:         Intake/Output Summary (Last 24 hours) at 04/05/2017 0728 Last data filed at 04/05/2017 0600 Gross per 24 hour  Intake 759.13 ml  Output 3000 ml  Net -2240.87 ml   Filed Weights   04/04/17 1830 04/04/17 2000 04/05/17 0400  Weight: 132 lb 4.4 oz (60 kg) 115 lb 15.4 oz (52.6 kg) 116 lb (52.6 kg)    Telemetry    No adverse arrhythmias, no ventricular tachycardia, intraventricular conduction delay noted. - Personally Reviewed  ECG    Left bundle branch block, LVH, ST segment depression noted.- Personally Reviewed  Physical Exam   GEN:  Ill-appearing, pale, in bed, conversant this morning, appears somewhat better than yesterday but still having nausea. Neck: No JVD, dialysis catheter Cardiac: RRR, 2/6 systolic murmur, no rubs, or gallops.  Respiratory:  Minimal crackles at bases bilaterally  GI: Soft, nontender, non-distended  MS: No edema; No deformity. Neuro:  Nonfocal  Psych: Normal affect   Labs    Chemistry Recent Labs  Lab 04/04/17 1021 04/04/17 1036 04/04/17 1430  NA 140 139 138  K 4.1 4.1 4.2  CL 99* 100* 98*  CO2 27  --  26  GLUCOSE 281* 294* 257*  BUN 33* 37* 37*  CREATININE 5.10* 5.10* 5.31*  CALCIUM 9.7  --  9.3  PROT 8.2*  --   --  ALBUMIN 3.5  --  3.2*  AST 42*  --   --   ALT 11*  --   --   ALKPHOS 75  --   --   BILITOT 0.6  --   --   GFRNONAA 8*  --  8*  GFRAA 10*  --  9*  ANIONGAP 14  --  14     Hematology Recent Labs  Lab 04/04/17 1349 04/04/17 1430 04/05/17 0648  WBC 11.1* 11.4* 13.1*  RBC 3.48* 3.31* 3.59*  HGB 10.5* 9.9* 10.6*  HCT 33.5* 31.8* 34.3*  MCV 96.3 96.1 95.5  MCH 30.2 29.9 29.5  MCHC 31.3 31.1 30.9  RDW 18.1* 18.0* 17.9*  PLT 164 187 199    Cardiac Enzymes Recent Labs  Lab 04/04/17 1910 04/05/17 0026  TROPONINI >65.00* >65.00*    Recent Labs  Lab 04/04/17 1033 04/04/17 1400  TROPIPOC 8.52* >30.00*     BNPNo results for input(s): BNP, PROBNP in the last 168 hours.   DDimer No results for  input(s): DDIMER in the last 168 hours.   Radiology    Abd 1 View (kub)  Result Date: 04/04/2017 CLINICAL DATA:  61 year old female with abdominal pain, nausea vomiting. EXAM: ABDOMEN - 1 VIEW COMPARISON:  CT of the abdomen pelvis dated 02/06/2017 FINDINGS: There is no bowel dilatation or evidence of obstruction. No free air identified on the provided images. There is extensive atherosclerotic calcification of the aorta and mesenteric vasculature. Large amount of stool noted in the rectal vault. The osseous structures and soft tissues appear unremarkable. IMPRESSION: Large amount of stool in the rectal vault may represent fecal impaction. No evidence of obstruction. Electronically Signed   By: Anner Crete M.D.   On: 04/04/2017 21:49   Dg Chest Portable 1 View  Result Date: 04/04/2017 CLINICAL DATA:  Hypoxia. EXAM: PORTABLE CHEST 1 VIEW COMPARISON:  Radiograph of February 07, 2017. FINDINGS: Stable cardiomegaly with central pulmonary vascular congestion. Atherosclerosis of thoracic aorta is noted. Bilateral pulmonary edema is noted with right worse than left. No pneumothorax or pleural effusion is noted. Right internal jugular dialysis catheter is unchanged in position. Sternotomy wires are noted. Bony thorax is unremarkable. IMPRESSION: Bilateral pulmonary edema is noted, right worse than left. Aortic atherosclerosis. Electronically Signed   By: Marijo Conception, M.D.   On: 04/04/2017 10:48    Cardiac Studies   [facility] 02/11/17 Procedures   LEFT HEART CATH AND CORS/GRAFTS ANGIOGRAPHY  Conclusion   Conclusions: 1. Severe native coronary artery disease, including heavily calcified and diffusely diseased LAD with distal occlusion, chronic total occlusion of the ostial LCx, and chronic total occlusion of ostial RCA. 2. Patent LIMA to distal LAD with 50% ostial stenosis. 3. Patent SVG to OM and SVG to RCA. Both grafts supplies small, diffusely diseased vessels. 4. Normal left ventricular  filling pressure. 5. Mild to moderate aortic stenosis.  Recommendations: 1. No good targets for percutaneous intervention. Recommend medical therapy. 2. Aggressive secondary prevention.      Patient Profile     61 y.o. female with coronary artery disease status post CABG with cardiac catheterization performed last month showing 50% ostial stenosis of LIMA to LAD with patent SVG to OM, SVG to RCA but both graft supply small diffusely diseased vessels with mild to moderate aortic stenosis as well.  Her native vessels are heavily calcified and diffusely diseased.  Currently having non-ST elevation myocardial infarction with symptoms of nausea and vomiting.  Denying any active chest pain.  Assessment &  Plan    Non-ST elevation myocardial infarction -Troponin has vastly increased to greater than 65.  She certainly could have occluded 1 of her bypass grafts or native vessels.  Interestingly, she is not having any chest pain.  She did have chest discomfort prior to her bypass surgery she states.  She has not felt any escalation of anginal symptoms at home.  Her symptoms currently are nausea, vomiting.  She states that 1 of her dialysis mates was throwing up and this made her feel nauseous and she began to throw up as well.  Perhaps she has a inflammatory response that has caused plaque rupture from potentially a gastroenteritis.  Or, this MI is being manifested early by mid abdominal discomfort and nausea and vomiting. -Continue with IV heparin.  Beta-blocker as tolerated however she was hypotensive post dialysis. -Awaiting repeat echocardiogram. -I think it would be a good idea to study her with another diagnostic cardiac catheterization to see if there is anything new that has developed given her markedly elevated troponin.  Perhaps this will take place tomorrow.  Her abdomen seems soft, does not seem to have any intra-abdominal process.  Mild to moderate aortic stenosis -Evaluate with  echocardiogram.  Should be of no clinical consequence.  Murmur appreciated on exam.  End-stage renal disease -On hemodialysis, last session 04/04/17.  Post dialysis hypotension, fluid boluses administered.  Chest x-ray originally appeared to have a degree of pulmonary edema.  She is currently breathing well.  Minimal crackles at bases.  Diabetes with hypertension -Per primary team.  Continue with aggressive secondary prevention, metoprolol, statin with Lipitor, I will add clopidogrel.  For questions or updates, please contact [place] Please consult [email or web address] for contact info under Cardiology/STEMI.      Signed, Candee Furbish, MD  04/05/2017, 7:28 AM

## 2017-04-05 NOTE — Progress Notes (Signed)
*  PRELIMINARY RESULTS* Echocardiogram 2D Echocardiogram has been performed.  Brandi Arellano 04/05/2017, 4:41 PM

## 2017-04-05 NOTE — Progress Notes (Signed)
FPTS Interim Progress Note  S: Came up to evaluate patient has some concern for hypotension earlier.  Patient had received 500 cc bolus.  She was well-appearing. States that her nausea has improved significantly.  She was able to eat a little bit of her dinner.  O: BP (!) 79/55   Pulse 63   Temp 97.9 F (36.6 C) (Oral)   Resp 16   Ht 5\' 7"  (1.702 m)   Wt 116 lb (52.6 kg)   SpO2 98%   BMI 18.17 kg/m   Well-appearing sitting up in bed eating her dinner Cardiac: regular rate and rhythm Chest: no increased work of breathing  A/P: Concern for hypotension -Patient significantly improved with 500 cc bolus; repeat blood pressure 128/76 -Patient states she is feeling better, looks well no respiratory distress, currently on 3 L satting 100% -Discussed patient briefly with cardiology given NSTEMI; possibly some decrease in cardiac contraction as well contributing to patient's current hypotension; furthermore patient is on dialysis and likely has some volatile blood pressures   Nan Maya, Jeani Sow, MD 04/05/2017, 6:36 PM PGY-3, [facility] pager [phone]

## 2017-04-06 ENCOUNTER — Encounter (HOSPITAL_COMMUNITY)
Admission: EM | Disposition: A | Discharge status: Home / Self Care | Payer: Self-pay | Source: Home / Self Care | Attending: Family Medicine

## 2017-04-06 HISTORY — PX: LEFT HEART CATH AND CORONARY ANGIOGRAPHY: CATH118249

## 2017-04-06 LAB — RENAL FUNCTION PANEL
ALBUMIN: 2.9 g/dL — AB (ref 3.5–5.0)
ANION GAP: 12 (ref 5–15)
BUN: 54 mg/dL — ABNORMAL HIGH (ref 6–20)
CALCIUM: 8.3 mg/dL — AB (ref 8.9–10.3)
CO2: 23 mmol/L (ref 22–32)
Chloride: 98 mmol/L — ABNORMAL LOW (ref 101–111)
Creatinine, Ser: 6.56 mg/dL — ABNORMAL HIGH (ref 0.44–1.00)
GFR calc Af Amer: 7 mL/min — ABNORMAL LOW (ref >60)
GFR calc non Af Amer: 6 mL/min — ABNORMAL LOW (ref >60)
GLUCOSE: 102 mg/dL — AB (ref 65–99)
PHOSPHORUS: 4.8 mg/dL — AB (ref 2.5–4.6)
Potassium: 3.7 mmol/L (ref 3.5–5.1)
SODIUM: 133 mmol/L — AB (ref 135–145)

## 2017-04-06 LAB — CBC
HCT: 28.5 % — ABNORMAL LOW (ref 36.0–46.0)
Hemoglobin: 9 g/dL — ABNORMAL LOW (ref 12.0–15.0)
MCH: 29.9 pg (ref 26.0–34.0)
MCHC: 31.6 g/dL (ref 30.0–36.0)
MCV: 94.7 fL (ref 78.0–100.0)
PLATELETS: 167 10*3/uL (ref 150–400)
RBC: 3.01 MIL/uL — ABNORMAL LOW (ref 3.87–5.11)
RDW: 17.7 % — AB (ref 11.5–15.5)
WBC: 6.7 10*3/uL (ref 4.0–10.5)

## 2017-04-06 LAB — GLUCOSE, CAPILLARY
GLUCOSE-CAPILLARY: 143 mg/dL — AB (ref 65–99)
GLUCOSE-CAPILLARY: 89 mg/dL (ref 65–99)
GLUCOSE-CAPILLARY: 102 mg/dL — AB (ref 65–99)
Glucose-Capillary: 140 mg/dL — ABNORMAL HIGH (ref 65–99)

## 2017-04-06 LAB — POCT ACTIVATED CLOTTING TIME: Activated Clotting Time: 120 seconds

## 2017-04-06 SURGERY — LEFT HEART CATH AND CORONARY ANGIOGRAPHY
Anesthesia: LOCAL

## 2017-04-06 MED ORDER — IOPAMIDOL (ISOVUE-370) INJECTION 76%
INTRAVENOUS | Status: DC | PRN
  Administered 2017-04-06: 130 mL via INTRA_ARTERIAL

## 2017-04-06 MED ORDER — HEPARIN (PORCINE) IN NACL 100-0.45 UNIT/ML-% IJ SOLN
950.0000 [IU]/h | INTRAMUSCULAR | Status: DC
  Administered 2017-04-06: 20:00:00 950 [IU]/h via INTRAVENOUS
  Filled 2017-04-06: qty 250

## 2017-04-06 MED ORDER — DOXERCALCIFEROL 4 MCG/2ML IV SOLN
INTRAVENOUS | Status: AC
  Filled 2017-04-06: qty 2

## 2017-04-06 MED ORDER — MIDAZOLAM HCL 2 MG/2ML IJ SOLN
INTRAMUSCULAR | Status: AC
  Filled 2017-04-06: qty 2

## 2017-04-06 MED ORDER — MIDAZOLAM HCL 2 MG/2ML IJ SOLN
INTRAMUSCULAR | Status: DC | PRN
  Administered 2017-04-06: 1 mg via INTRAVENOUS

## 2017-04-06 MED ORDER — LIDOCAINE HCL (PF) 1 % IJ SOLN
INTRAMUSCULAR | Status: AC
Start: 2017-04-06 — End: 2017-04-06
  Filled 2017-04-06: qty 30

## 2017-04-06 MED ORDER — HEPARIN (PORCINE) IN NACL 2-0.9 UNIT/ML-% IJ SOLN
INTRAMUSCULAR | Status: AC | PRN
  Administered 2017-04-06: 1000 mL via INTRA_ARTERIAL

## 2017-04-06 MED ORDER — ACETAMINOPHEN 325 MG PO TABS: 650.0000 mg | ORAL_TABLET | ORAL | Status: DC | PRN

## 2017-04-06 MED ORDER — IOPAMIDOL (ISOVUE-370) INJECTION 76%
INTRAVENOUS | Status: AC
  Filled 2017-04-06: qty 50

## 2017-04-06 MED ORDER — LIDOCAINE HCL (PF) 1 % IJ SOLN: 5.0000 mL | INTRAMUSCULAR | Status: DC | PRN

## 2017-04-06 MED ORDER — SODIUM CHLORIDE 0.9 % IV SOLN: 250.0000 mL | INTRAVENOUS | Status: DC | PRN

## 2017-04-06 MED ORDER — ANGIOPLASTY BOOK
Freq: Once | Status: AC
  Administered 2017-04-06: 1
  Filled 2017-04-06: qty 1

## 2017-04-06 MED ORDER — ONDANSETRON HCL 4 MG/2ML IJ SOLN: 4.0000 mg | Freq: Four times a day (QID) | INTRAMUSCULAR | Status: DC | PRN

## 2017-04-06 MED ORDER — LIDOCAINE HCL (PF) 1 % IJ SOLN
INTRAMUSCULAR | Status: DC | PRN
Start: 2017-04-06 — End: 2017-04-06
  Administered 2017-04-06: 17 mL via INTRADERMAL

## 2017-04-06 MED ORDER — ATORVASTATIN CALCIUM 80 MG PO TABS
80.0000 mg | ORAL_TABLET | Freq: Every day | ORAL | Status: DC
  Administered 2017-04-06 – 2017-04-10 (×4): 80 mg via ORAL
  Filled 2017-04-06 (×5): qty 1

## 2017-04-06 MED ORDER — HEART ATTACK BOUNCING BOOK
Freq: Once | Status: AC
  Administered 2017-04-06: 22:00:00 1
  Filled 2017-04-06: qty 1

## 2017-04-06 MED ORDER — ACTIVE PARTNERSHIP FOR HEALTH OF YOUR HEART BOOK
Freq: Once | Status: AC
  Administered 2017-04-06: 1
  Filled 2017-04-06: qty 1

## 2017-04-06 MED ORDER — IOPAMIDOL (ISOVUE-370) INJECTION 76%
INTRAVENOUS | Status: AC
  Filled 2017-04-06: qty 100

## 2017-04-06 MED ORDER — ASPIRIN 81 MG PO CHEW
81.0000 mg | CHEWABLE_TABLET | Freq: Every day | ORAL | Status: DC
  Administered 2017-04-08 – 2017-04-11 (×4): 81 mg via ORAL
  Filled 2017-04-06 (×4): qty 1

## 2017-04-06 MED ORDER — SODIUM CHLORIDE 0.9 % IV SOLN: 100.0000 mL | INTRAVENOUS | Status: DC | PRN

## 2017-04-06 MED ORDER — SODIUM CHLORIDE 0.9 % IV SOLN: INTRAVENOUS | Status: DC

## 2017-04-06 MED ORDER — SODIUM CHLORIDE 0.9% FLUSH
3.0000 mL | Freq: Two times a day (BID) | INTRAVENOUS | Status: DC
  Administered 2017-04-06: 22:00:00 3 mL via INTRAVENOUS

## 2017-04-06 MED ORDER — LIDOCAINE-PRILOCAINE 2.5-2.5 % EX CREA
1.0000 "application " | TOPICAL_CREAM | CUTANEOUS | Status: DC | PRN
  Filled 2017-04-06: qty 5

## 2017-04-06 MED ORDER — HEPARIN SODIUM (PORCINE) 5000 UNIT/ML IJ SOLN: 5000.0000 [IU] | Freq: Three times a day (TID) | INTRAMUSCULAR | Status: DC

## 2017-04-06 MED ORDER — SODIUM CHLORIDE 0.9% FLUSH: 3.0000 mL | INTRAVENOUS | Status: DC | PRN

## 2017-04-06 MED ORDER — HEPARIN (PORCINE) IN NACL 2-0.9 UNIT/ML-% IJ SOLN
INTRAMUSCULAR | Status: AC
  Filled 2017-04-06: qty 500

## 2017-04-06 MED ORDER — SODIUM CHLORIDE 0.9 % IV SOLN
INTRAVENOUS | Status: DC
  Administered 2017-04-07: 06:00:00 via INTRAVENOUS

## 2017-04-06 MED ORDER — SODIUM CHLORIDE 0.9% FLUSH: 3.0000 mL | Freq: Two times a day (BID) | INTRAVENOUS | Status: DC

## 2017-04-06 MED ORDER — ASPIRIN 81 MG PO CHEW
81.0000 mg | CHEWABLE_TABLET | ORAL | Status: AC
  Administered 2017-04-07: 07:00:00 81 mg via ORAL
  Filled 2017-04-06: qty 1

## 2017-04-06 MED ORDER — ASPIRIN 81 MG PO CHEW
81.0000 mg | CHEWABLE_TABLET | ORAL | Status: AC
  Administered 2017-04-06: 81 mg via ORAL
  Filled 2017-04-06: qty 1

## 2017-04-06 MED ORDER — SODIUM CHLORIDE 0.9% FLUSH
3.0000 mL | Freq: Two times a day (BID) | INTRAVENOUS | Status: DC
  Administered 2017-04-06: 3 mL via INTRAVENOUS

## 2017-04-06 MED ORDER — HEPARIN SODIUM (PORCINE) 1000 UNIT/ML DIALYSIS
1000.0000 [IU] | INTRAMUSCULAR | Status: DC | PRN
  Filled 2017-04-06: qty 1

## 2017-04-06 MED ORDER — PENTAFLUOROPROP-TETRAFLUOROETH EX AERO
1.0000 "application " | INHALATION_SPRAY | CUTANEOUS | Status: DC | PRN
  Filled 2017-04-06: qty 30

## 2017-04-06 MED ORDER — CLOPIDOGREL BISULFATE 75 MG PO TABS
75.0000 mg | ORAL_TABLET | Freq: Every day | ORAL | Status: DC
  Administered 2017-04-06 – 2017-04-11 (×6): 75 mg via ORAL
  Filled 2017-04-06 (×8): qty 1

## 2017-04-06 MED ORDER — ALTEPLASE 2 MG IJ SOLR: 2.0000 mg | Freq: Once | INTRAMUSCULAR | Status: DC | PRN

## 2017-04-06 SURGICAL SUPPLY — 7 items
CATH INFINITI 5FR MULTPACK ANG (CATHETERS) ×1 IMPLANT
KIT HEART LEFT (KITS) ×2 IMPLANT
PACK CARDIAC CATHETERIZATION (CUSTOM PROCEDURE TRAY) ×2 IMPLANT
TRANSDUCER W/STOPCOCK (MISCELLANEOUS) ×2 IMPLANT
WIRE EMERALD 3MM-J .035X150CM (WIRE) ×1 IMPLANT
WIRE EMERALD 3MM-J .035X260CM (WIRE) ×1 IMPLANT
WIRE EMERALD ST .035X260CM (WIRE) IMPLANT

## 2017-04-06 NOTE — Progress Notes (Addendum)
     Cardiac catheterization reveals ostial LIMA 90% stenosis after several views.  Subclavian stent proximal to this.  Calcified lesion.  She also has significant plaque burden, dense calcification in her large proximal and mid diagonal branch.  Diagonal branch is just is vital as her LAD, large in caliber.  Both SVG grafts are widely patent and provide collateral blood flow through the septal branches to a small portion of her distal LAD.  It is certainly plausible that she is ischemic from her ostial LAD lesion as well as her diagonal lesions.  We will have the interventional team discussed with her in more detail the risks of the procedure and developed a staged plan.  First lesion may be the LIMA ostial lesion and then second lesion to be done would be the Rotablator to diagonal branch.  I had lengthy discussion with her in the Cath Lab holding area.  She understands the risks and benefits of procedure including worsening MI, arrhythmia, stroke, death.  She is willing to proceed.  I gave her option of having nothing done and she understands that she will continue to have ongoing ischemia especially during hemodialysis sessions.   Also complicating the situation is her invasive squamous cell bladder cancer.  Dr. Irine Seal of [facility] performed cystoscopy in late November 2018.  Plan was to perform extensive GU resection possibly in January however given her current non-ST elevation myocardial infarction and cardiac situation, she is too high risk to undergo this procedure currently.  Revascularization as described above, ostial LAD as well as calcified diagonal will be performed.  Potentially, if drug-eluting stent is placed, she could be on dual antiplatelet therapy for 3 months then undergo surgery.   I discussed her case with Dr. Dulcy Fanny of [facility] who was scheduled to perform extensive GU resection on 05/05/16.  Given the recent large infarct, her surgery will need to be  postponed.  Given the Rotablator procedure planned, DES placement will be inevitable.  He states that closer to the 3 months timeframe, he would restage her with imaging prior to extensive GU resection.  We will go ahead and proceed with first of stage procedure, ostial LIMA tomorrow on 04/07/17.  Following that, perhaps Monday, we will proceed with Rotablator of diagonal.  Candee Furbish, MD

## 2017-04-06 NOTE — Care Management Note (Signed)
Case Management Note  Patient Details  Name: Briah Nary MRN: [id number] Date of Birth: 06-25-1955  Subjective/Objective:   Presents with N/V , acute hypoxia, NSTEMI, pyuria, has hx of ESRD, invasive bladder cancer, htn, dm2. For heart cath today.                   Action/Plan: NCM will follow for dc needs.   Expected Discharge Date:  04/09/17               Expected Discharge Plan:     In-House Referral:     Discharge planning Services  CM Consult  Post Acute Care Choice:    Choice offered to:     DME Arranged:    DME Agency:     HH Arranged:    HH Agency:     Status of Service:  In process, will continue to follow  If discussed at Long Length of Stay Meetings, dates discussed:    Additional Comments:  Zenon Mayo, RN 04/06/2017, 5:37 PM

## 2017-04-06 NOTE — Interval H&P Note (Signed)
Cath Lab Visit (complete for each Cath Lab visit)  Clinical Evaluation Leading to the Procedure:   ACS: Yes.    Non-ACS:    Anginal Classification: CCS III  Anti-ischemic medical therapy: Maximal Therapy (2 or more classes of medications)  Non-Invasive Test Results: No non-invasive testing performed  Prior CABG: Previous CABG      History and Physical Interval Note:  04/06/2017 9:58 AM  Brandi Arellano  has presented today for surgery, with the diagnosis of NSTEMI  The various methods of treatment have been discussed with the patient and family. After consideration of risks, benefits and other options for treatment, the patient has consented to  Procedure(s): LEFT HEART CATH AND CORONARY ANGIOGRAPHY (N/A) as a surgical intervention .  The patient's history has been reviewed, patient examined, no change in status, stable for surgery.  I have reviewed the patient's chart and labs.  Questions were answered to the patient's satisfaction.     Shelva Majestic

## 2017-04-06 NOTE — Progress Notes (Signed)
ANTICOAGULATION CONSULT NOTE - Follow Up Consult  Pharmacy Consult for Heparin Indication: chest pain/ACS  Allergies  Allergen Reactions  . Diphenhydramine Nausea And Vomiting  . Tramadol Hcl Nausea And Vomiting  . Morphine And Related Nausea And Vomiting    Patient Measurements: Height: 5\' 7"  (170.2 cm) Weight: 120 lb (54.4 kg) IBW/kg (Calculated) : 61.6  Vital Signs: Temp: 98 F (36.7 C) (12/26 1245) Temp Source: Oral (12/26 1245) BP: 120/62 (12/26 1245) Pulse Rate: 58 (12/26 1245)  Labs: Recent Labs    04/04/17 1430 04/04/17 1910 04/05/17 0026 04/05/17 0648 04/05/17 1048 04/05/17 1346 04/06/17 1400  HGB 9.9*  --   --  10.6*  --   --  9.0*  HCT 31.8*  --   --  34.3*  --   --  28.5*  PLT 187  --   --  199  --   --  167  HEPARINUNFRC  --   --  0.28*  --  0.31  --   --   CREATININE 5.31*  --   --  3.80*  --   --  6.56*  CKTOTAL  --   --   --   --   --  532*  --   CKMB  --   --   --   --   --  44.5*  --   TROPONINI  --  >65.00* >65.00*  --  60.15*  --   --     Estimated Creatinine Clearance: 7.8 mL/min (A) (by C-G formula based on SCr of 6.56 mg/dL (H)).  Assessment: 61 y.o. female started on a heparin drip for NSTEMI, now s/p cath with blockages to ostial LIMA and diagonal branch. Plan is for DES to ostial LIMA tomorrow. Pharmacy consulted to resume heparin drip 8 hrs post sheath pull with no bolus. Sheath out ~noon.  Heparin level was 0.31 and on low end of goal on 900 units/hr yesterday so will increase a little. No bleeding noted, Hgb 9, platelets are normal.  Goal of Therapy:  Heparin level 0.3-0.7 units/ml Monitor platelets by anticoagulation protocol: Yes   Plan:  Resume heparin drip at 950 units/hr with no bolus at 20:00 8 hr heparin level Daily heparin level and CBC Monitor for s/sx of bleeding   Renold Genta, PharmD, BCPS Clinical Pharmacist Phone for today - [place] - [phone] 04/06/2017 3:10 PM

## 2017-04-06 NOTE — Progress Notes (Addendum)
Order for sheath removal verified per post procedural orders. 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 5 [place] Sheath removed and manual pressure applied for 20  minutes. Pre, peri, & post procedural vitals: HR 50's, RR 11, O2 Sat upper 90's (on 3 L's), BP 143-105/72-63, Pain 0. Access site level 0 and dressed with 4X4 gauze and tegaderm.  Mickel Baas, RN confirmed condition of site. Post procedural instructions discussed and return demonstration from patient.

## 2017-04-06 NOTE — Progress Notes (Signed)
Family Medicine Teaching Service Daily Progress Note Intern Pager: [phone]  Patient name: Brandi Arellano Medical record number: [id number] Date of birth: February 12, 1956 Age: 61 y.o. Gender: female  Primary Care Provider: Kerin Perna, NP Consultants: nephrology, cardiology Code Status: partial DNI  [name]:   Assessment and Plan: Brandi Arellano is a 61 y.o. female presenting with nausea and vomiting, found to be hypoxic with bilateral pleural effusions. PMH is significant for ESRD, HTN, T2DM, invasive bladder cancer  Acute Hypoxia- Sats at 100% on 3 Liters, no previous oxygen requirement. CXR showing mild vascular congestion and small right effusion. Mild leukocytosis on admission, patient is afebrile, no signs of pneumonia on CXR. Hypoxia may be secondary to acute coronary event. Patient did emphasize on admission she is DNI.  -continuous pulse ox and cardiac monitoring -supplemental O2 as needed to keep sats > 90%  NSTEMI - Troponin 8.52> 30>(>65)>(>65)>60.   Hx of CAD. EKG with LBBB.  Cardiology was consulted and started patient on heparin drip.  AM EKG similar to previous.  Total CK 532 -cardiology following, appreciate recommendations -continue heparin drip -Per cardiology adding clopidogrel. - Continue  Metoprolol and Lipitor  - Cards another diagnostic cardiac catheterization 12/26 - prn nitro ordered  Pyuria: Suprapubic tenderness. Will treat for UTI  - Urine culture pending  - Ceftriaxone 1 gram daily  - Nurse order to change urine catheter  -culture pending  Nausea and vomiting- KUB with a large amount of stool in the rectal vault may represent fecal impaction. No evidence of obstruction.Also with known invasive bladder cancer that may be causing pain. - Will need an enema in the future given concern for fecal impaction - will hold off as concern for vasovagal response given patient's tentative cardiac status, NO FLEET - Will give  metoclopramide 5 mg BID, dose adjusted for kidney function  - Zofran as needed for nausea  - oxy IR 5 mg every  3 hour prn   ESRD on HD- got HD 12/23 and 12/24 -nephrology following -daily renal function panels  Invasive bladder cancer- chronic indwelling Foley in place -patient was to follow up w/ urology  in January  HTN-  Hypotensive overnight, was going to offer small bolus but BP recovered  - Will hold amlodipine for now  - continue metop  T2DM- last A1C 6.8 02/07/17. Not on any home meds. Glucose 12/26 140s-150s -sensitive SSI -CBGs AC/HS  Hypothyroidism- stable, on synthroid -continue home synthroid -check TSH  HLD- stable, on lipitor -continue home medication  FEN/GI: renal/carb modified Prophylaxis: heparin  Disposition: Step Down  Subjective:  Patient seemed flat but denied depression and declined to speak with chaplain or palliative.  She insisted she was just tired.   Denied any current pain or SOB  Objective: Temp:  [97.4 F (36.3 C)-98 F (36.7 C)] 97.7 F (36.5 C) (12/26 0400) Pulse Rate:  [59-86] 59 (12/26 0400) Resp:  [5-23] 14 (12/26 0400) BP: (68-157)/(46-95) 102/64 (12/26 0400) SpO2:  [97 %-100 %] 99 % (12/26 0400) Weight:  [120 lb (54.4 kg)] 120 lb (54.4 kg) (12/26 0053) Physical Exam: General: Middle aged female in no distress Cardiovascular: Murmur noted on exam,  Otherwise regular rate  Respiratory: CTAB,  Abdomen: No BS heard, epigastric tenderness noted to palpation, soft belly Extremities: No lower extremity edema noted   Laboratory: Recent Labs  Lab 04/04/17 1349 04/04/17 1430 04/05/17 0648  WBC 11.1* 11.4* 13.1*  HGB 10.5* 9.9* 10.6*  HCT 33.5* 31.8* 34.3*  PLT 164 187 199   Recent Labs  Lab 04/04/17 1021 04/04/17 1036 04/04/17 1430 04/05/17 0648  NA 140 139 138 136  K 4.1 4.1 4.2 4.0  CL 99* 100* 98* 98*  CO2 27  --  26 23  BUN 33* 37* 37* 24*  CREATININE 5.10* 5.10* 5.31* 3.80*  CALCIUM 9.7  --  9.3 8.8*   PROT 8.2*  --   --   --   BILITOT 0.6  --   --   --   ALKPHOS 75  --   --   --   ALT 11*  --   --   --   AST 42*  --   --   --   GLUCOSE 281* 294* 257* 203*     Imaging/Diagnostic Tests: Dg Chest 2 View  Result Date: 04/05/2017 CLINICAL DATA:  Abdominal pain with nausea and vomiting 3 days. Dry cough. EXAM: CHEST  2 VIEW COMPARISON:  04/04/2017 FINDINGS: Sternotomy wires and right IJ dialysis catheter unchanged. Lungs are adequately inflated with minimal prominence of the perihilar markings suggesting mild vascular congestion. Small amount of posterior right pleural fluid. Cardiomediastinal silhouette and remainder of the exam is unchanged. IMPRESSION: Findings suggesting mild vascular congestion.  Small right effusion. Electronically Signed   By: Marin Olp M.D.   On: 04/05/2017 10:18    Sherene Sires, DO 04/06/2017, 6:45 AM PGY-1, [place] Intern pager: [phone], text pages welcome

## 2017-04-06 NOTE — Progress Notes (Signed)
   04/06/17 1700  Clinical Encounter Type  Visited With Patient  Visit Type Initial  Consult/Referral To Nurse    Patient was in hemodialysis. Followed her with ASD paperwork. Patient declined that son has one in place. Chaplain provided emotional support.  Roch Quach a Medical sales representative, [employer]

## 2017-04-06 NOTE — Progress Notes (Signed)
[facility] Progress Note   Subjective:  Seen on HD. No CP or dyspnea this morning; she is tired and hungry. S/p LHC this morning showing blockages to ostial LIMA and diagonal branch. Per cardiology note, plan is for DES to ostial LIMA tomorrow. Her bladder surgery will need to be delayed x 3 months given need for dual-antiplatelet therapy.  Objective Vitals:   04/06/17 1155 04/06/17 1200 04/06/17 1235 04/06/17 1245  BP: 105/63 130/67 133/68 120/62  Pulse: (!) 56 (!) 57 62 (!) 58  Resp: 14 11 12 13   Temp:    98 F (36.7 C)  TempSrc:    Oral  SpO2: 100% 100% 94% 93%  Weight:      Height:       Physical Exam General: Sleepy appearing female. NAD. On nasal oxygen. Heart: RRR; 4/6 systolic murmur Lungs: CTA anteriorly Abdomen: soft, non-tender. Foley in place. 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) Dialysis Access: TDC in R chest  Additional Objective Labs: Basic Metabolic Panel: Recent Labs  Lab 04/04/17 1021 04/04/17 1036 04/04/17 1430 04/05/17 0648  NA 140 139 138 136  K 4.1 4.1 4.2 4.0  CL 99* 100* 98* 98*  CO2 27  --  26 23  GLUCOSE 281* 294* 257* 203*  BUN 33* 37* 37* 24*  CREATININE 5.10* 5.10* 5.31* 3.80*  CALCIUM 9.7  --  9.3 8.8*  PHOS  --   --  6.8* 5.0*   Liver Function Tests: Recent Labs  Lab 04/04/17 1021 04/04/17 1430 04/05/17 0648  AST 42*  --   --   ALT 11*  --   --   ALKPHOS 75  --   --   BILITOT 0.6  --   --   PROT 8.2*  --   --   ALBUMIN 3.5 3.2* 3.5   Recent Labs  Lab 04/05/17 1812  LIPASE 56*   CBC: Recent Labs  Lab 04/04/17 1021  04/04/17 1349 04/04/17 1430 04/05/17 0648  WBC 11.3*  --  11.1* 11.4* 13.1*  HGB 9.7*   < > 10.5* 9.9* 10.6*  HCT 30.4*   < > 33.5* 31.8* 34.3*  MCV 95.3  --  96.3 96.1 95.5  PLT 167  --  164 187 199   < > = values in this interval not displayed.   Cardiac Enzymes: Recent Labs  Lab 04/04/17 1910 04/05/17 0026 04/05/17 1048 04/05/17 1346  CKTOTAL  --   --   --   532*  CKMB  --   --   --  44.5*  TROPONINI >65.00* >65.00* 60.15*  --    CBG: Recent Labs  Lab 04/05/17 1255 04/05/17 1800 04/05/17 2053 04/06/17 0902 04/06/17 1242  GLUCAP 197* 124* 158* 143* 89   Studies/Results: Dg Chest 2 View  Result Date: 04/05/2017 CLINICAL DATA:  Abdominal pain with nausea and vomiting 3 days. Dry cough. EXAM: CHEST  2 VIEW COMPARISON:  04/04/2017 FINDINGS: Sternotomy wires and right IJ dialysis catheter unchanged. Lungs are adequately inflated with minimal prominence of the perihilar markings suggesting mild vascular congestion. Small amount of posterior right pleural fluid. Cardiomediastinal silhouette and remainder of the exam is unchanged. IMPRESSION: Findings suggesting mild vascular congestion.  Small right effusion. Electronically Signed   By: Marin Olp M.D.   On: 04/05/2017 10:18   Abd 1 View (kub)  Result Date: 04/04/2017 CLINICAL DATA:  61 year old female with abdominal pain, nausea vomiting. EXAM: ABDOMEN - 1 VIEW COMPARISON:  CT of the  abdomen pelvis dated 02/06/2017 FINDINGS: There is no bowel dilatation or evidence of obstruction. No free air identified on the provided images. There is extensive atherosclerotic calcification of the aorta and mesenteric vasculature. Large amount of stool noted in the rectal vault. The osseous structures and soft tissues appear unremarkable. IMPRESSION: Large amount of stool in the rectal vault may represent fecal impaction. No evidence of obstruction. Electronically Signed   By: Anner Crete M.D.   On: 04/04/2017 21:49   Medications: . sodium chloride 100 mL (04/05/17 0146)  . sodium chloride    . sodium chloride    . sodium chloride    . sodium chloride    . sodium chloride    . cefTRIAXone (ROCEPHIN)  IV Stopped (04/05/17 1600)  . heparin Stopped (04/06/17 0910)   . [START ON 04/07/2017] aspirin  81 mg Oral Daily  . atorvastatin  80 mg Oral q1800  . [START ON 04/07/2017] clopidogrel  75 mg Oral Q  breakfast  . docusate sodium  100 mg Oral BID  . doxercalciferol  2 mcg Intravenous Q M,W,F-HD  . heparin  4,000 Units Dialysis Once in dialysis  . [START ON 04/07/2017] heparin  5,000 Units Subcutaneous Q8H  . insulin aspart  0-5 Units Subcutaneous QHS  . insulin aspart  0-9 Units Subcutaneous TID WC  . lanthanum  1,000 mg Oral TID WC  . levothyroxine  25 mcg Oral QAC breakfast  . mouth rinse  15 mL Mouth Rinse BID  . metoCLOPramide (REGLAN) injection  5 mg Intravenous Q12H  . metoprolol tartrate  25 mg Oral BID  . senna-docusate  1 tablet Oral QHS  . sodium chloride flush  3 mL Intravenous Q12H  . sodium chloride flush  3 mL Intravenous Q12H  . sodium chloride flush  3 mL Intravenous Q12H  . thiamine  100 mg Oral Daily    Dialysis Orders: [facility] MWF 4h EDW ? Hep 4000 R IJ cath, BFR400/Auto 1.5, Linear sodium UFP 4 - Hectorol 2 mcg IV TIW (no recent PTH) - Mircera 150 mcg IV q 2 weeks (last dose 50 mcg IV 03/23/17 Last HGB 8.6 04/09/17) - home BP Rx > norvasc 10 qd/ metop 25 bid  Impression: 1. Acute NSTEMI (trop peaked >65), known CAD (Hx CABG): Echo with EF 40-45% 12/25. S/p LHC 12/26 with stenosis to LIMA (ostial LAD) and diagonal branch. For repeat cath with DES to LAD tomorrow. 2. Pulm edema (on admit): Improved s/p HD. 3. Abd pain: Improving. 4. ESRD: Usual MWF schedule. S/p HD Sunday  and Monday, now back to usual schedule. 5. HTN/volume: BP controlled, no volume excess on exam. On metop 25mg  BID. 6. Anemia of CKD: Hgb 10.6, for Aranesp 144mcg today. 7. SCCa of bladder: S/p TURBT on 11/20, didn't get all of tumors due to large size. Was scheduled for cystectomy and B nephrectomy in mid-Jan. Unfortunately, this will need to be pushed out given acute MI. Per card note, will need to be on dual antiplatelets x 3 months. 8. CAD (Hx CABG): See above.  Plan: HD today and repeat cardiac cath with DES to LAD sched for tomorrow.    Veneta Penton, PA-C 04/06/2017, 1:39 PM   [facility] Pager: [phone]  Pt seen, examined and agree w A/P as above.  [name] MD [employer] pager [phone]   04/06/2017, 2:58 PM

## 2017-04-07 ENCOUNTER — Encounter (HOSPITAL_COMMUNITY)
Admission: EM | Disposition: A | Discharge status: Home / Self Care | Payer: Self-pay | Source: Home / Self Care | Attending: Family Medicine

## 2017-04-07 ENCOUNTER — Encounter (HOSPITAL_COMMUNITY): Payer: Self-pay | Admitting: Cardiovascular Disease

## 2017-04-07 DIAGNOSIS — Z794 Long term (current) use of insulin: Secondary | ICD-10-CM

## 2017-04-07 DIAGNOSIS — N390 Urinary tract infection, site not specified: Secondary | ICD-10-CM

## 2017-04-07 DIAGNOSIS — C679 Malignant neoplasm of bladder, unspecified: Secondary | ICD-10-CM

## 2017-04-07 DIAGNOSIS — R0902 Hypoxemia: Secondary | ICD-10-CM

## 2017-04-07 DIAGNOSIS — Z992 Dependence on renal dialysis: Secondary | ICD-10-CM

## 2017-04-07 HISTORY — PX: CORONARY STENT INTERVENTION: CATH118234

## 2017-04-07 LAB — CBC
HEMATOCRIT: 27.6 % — AB (ref 36.0–46.0)
HEMOGLOBIN: 8.7 g/dL — AB (ref 12.0–15.0)
MCH: 29.6 pg (ref 26.0–34.0)
MCHC: 31.5 g/dL (ref 30.0–36.0)
MCV: 93.9 fL (ref 78.0–100.0)
PLATELETS: 157 10*3/uL (ref 150–400)
RBC: 2.94 MIL/uL — AB (ref 3.87–5.11)
RDW: 17.3 % — ABNORMAL HIGH (ref 11.5–15.5)
WBC: 8 10*3/uL (ref 4.0–10.5)

## 2017-04-07 LAB — GLUCOSE, CAPILLARY
GLUCOSE-CAPILLARY: 129 mg/dL — AB (ref 65–99)
GLUCOSE-CAPILLARY: 142 mg/dL — AB (ref 65–99)
GLUCOSE-CAPILLARY: 93 mg/dL (ref 65–99)
GLUCOSE-CAPILLARY: 79 mg/dL (ref 65–99)
Glucose-Capillary: 67 mg/dL (ref 65–99)
Glucose-Capillary: 62 mg/dL — ABNORMAL LOW (ref 65–99)
Glucose-Capillary: 117 mg/dL — ABNORMAL HIGH (ref 65–99)

## 2017-04-07 LAB — TROPONIN I: TROPONIN I: 20.08 ng/mL — AB (ref <0.03)

## 2017-04-07 LAB — RENAL FUNCTION PANEL
ANION GAP: 12 (ref 5–15)
Albumin: 2.8 g/dL — ABNORMAL LOW (ref 3.5–5.0)
BUN: 23 mg/dL — ABNORMAL HIGH (ref 6–20)
CHLORIDE: 95 mmol/L — AB (ref 101–111)
CO2: 25 mmol/L (ref 22–32)
Calcium: 8.3 mg/dL — ABNORMAL LOW (ref 8.9–10.3)
Creatinine, Ser: 4.18 mg/dL — ABNORMAL HIGH (ref 0.44–1.00)
GFR calc Af Amer: 12 mL/min — ABNORMAL LOW (ref >60)
GFR calc non Af Amer: 11 mL/min — ABNORMAL LOW (ref >60)
GLUCOSE: 114 mg/dL — AB (ref 65–99)
POTASSIUM: 3.4 mmol/L — AB (ref 3.5–5.1)
Phosphorus: 4 mg/dL (ref 2.5–4.6)
SODIUM: 132 mmol/L — AB (ref 135–145)

## 2017-04-07 LAB — POCT ACTIVATED CLOTTING TIME
ACTIVATED CLOTTING TIME: 296 s
ACTIVATED CLOTTING TIME: 312 s
Activated Clotting Time: 0 seconds
Activated Clotting Time: 329 seconds

## 2017-04-07 LAB — PROTIME-INR
INR: 1.13
PROTHROMBIN TIME: 14.4 s (ref 11.4–15.2)

## 2017-04-07 LAB — HEPARIN LEVEL (UNFRACTIONATED): Heparin Unfractionated: 0.42 IU/mL (ref 0.30–0.70)

## 2017-04-07 SURGERY — CORONARY STENT INTERVENTION
Anesthesia: LOCAL

## 2017-04-07 MED ORDER — DEXTROSE 50 % IV SOLN
INTRAVENOUS | Status: AC
  Administered 2017-04-07: 12:00:00
  Filled 2017-04-07: qty 50

## 2017-04-07 MED ORDER — FENTANYL CITRATE (PF) 100 MCG/2ML IJ SOLN
INTRAMUSCULAR | Status: DC | PRN
  Administered 2017-04-07: 25 ug via INTRAVENOUS

## 2017-04-07 MED ORDER — FENTANYL CITRATE (PF) 100 MCG/2ML IJ SOLN
INTRAMUSCULAR | Status: AC
Start: 2017-04-07 — End: 2017-04-07
  Filled 2017-04-07: qty 2

## 2017-04-07 MED ORDER — CLOPIDOGREL BISULFATE 75 MG PO TABS: 75.0000 mg | ORAL_TABLET | Freq: Every day | ORAL | Status: DC

## 2017-04-07 MED ORDER — SODIUM CHLORIDE 0.9% FLUSH: 3.0000 mL | INTRAVENOUS | Status: DC | PRN

## 2017-04-07 MED ORDER — HEPARIN (PORCINE) IN NACL 2-0.9 UNIT/ML-% IJ SOLN
INTRAMUSCULAR | Status: AC | PRN
  Administered 2017-04-07: 1500 mL

## 2017-04-07 MED ORDER — HEPARIN (PORCINE) IN NACL 2-0.9 UNIT/ML-% IJ SOLN
INTRAMUSCULAR | Status: AC
  Filled 2017-04-07: qty 500

## 2017-04-07 MED ORDER — HEPARIN SODIUM (PORCINE) 1000 UNIT/ML IJ SOLN
INTRAMUSCULAR | Status: AC
  Filled 2017-04-07: qty 1

## 2017-04-07 MED ORDER — ASPIRIN 81 MG PO CHEW: 81.0000 mg | CHEWABLE_TABLET | Freq: Every day | ORAL | Status: DC

## 2017-04-07 MED ORDER — DARBEPOETIN ALFA 100 MCG/0.5ML IJ SOSY
100.0000 ug | PREFILLED_SYRINGE | INTRAMUSCULAR | Status: DC
  Administered 2017-04-08: 10:00:00 100 ug via INTRAVENOUS
  Filled 2017-04-07: qty 0.5

## 2017-04-07 MED ORDER — SODIUM CHLORIDE 0.9% FLUSH: 3.0000 mL | Freq: Two times a day (BID) | INTRAVENOUS | Status: DC

## 2017-04-07 MED ORDER — HYDRALAZINE HCL 20 MG/ML IJ SOLN: 5.0000 mg | INTRAMUSCULAR | Status: AC | PRN

## 2017-04-07 MED ORDER — MIDAZOLAM HCL 2 MG/2ML IJ SOLN
INTRAMUSCULAR | Status: DC | PRN
  Administered 2017-04-07 (×2): 1 mg via INTRAVENOUS

## 2017-04-07 MED ORDER — SODIUM CHLORIDE 0.9 % IV SOLN: INTRAVENOUS | Status: AC

## 2017-04-07 MED ORDER — ACETAMINOPHEN 325 MG PO TABS: 650.0000 mg | ORAL_TABLET | ORAL | Status: DC | PRN

## 2017-04-07 MED ORDER — HEPARIN SODIUM (PORCINE) 1000 UNIT/ML IJ SOLN
INTRAMUSCULAR | Status: DC | PRN
  Administered 2017-04-07: 3000 [IU] via INTRAVENOUS
  Administered 2017-04-07: 2000 [IU] via INTRAVENOUS
  Administered 2017-04-07: 5000 [IU] via INTRAVENOUS

## 2017-04-07 MED ORDER — HEPARIN (PORCINE) IN NACL 2-0.9 UNIT/ML-% IJ SOLN
INTRAMUSCULAR | Status: AC
  Filled 2017-04-07: qty 1000

## 2017-04-07 MED ORDER — LIDOCAINE HCL (PF) 1 % IJ SOLN
INTRAMUSCULAR | Status: DC | PRN
  Administered 2017-04-07: 17 mL via SUBCUTANEOUS

## 2017-04-07 MED ORDER — IOPAMIDOL (ISOVUE-370) INJECTION 76%
INTRAVENOUS | Status: DC | PRN
  Administered 2017-04-07: 165 mL via INTRA_ARTERIAL

## 2017-04-07 MED ORDER — CITALOPRAM HYDROBROMIDE 20 MG PO TABS
20.0000 mg | ORAL_TABLET | Freq: Every day | ORAL | Status: DC
  Administered 2017-04-08 – 2017-04-10 (×3): 20 mg via ORAL
  Filled 2017-04-07 (×4): qty 1

## 2017-04-07 MED ORDER — LABETALOL HCL 5 MG/ML IV SOLN: 10.0000 mg | INTRAVENOUS | Status: AC | PRN

## 2017-04-07 MED ORDER — ATORVASTATIN CALCIUM 80 MG PO TABS: 80.0000 mg | ORAL_TABLET | Freq: Every day | ORAL | Status: DC

## 2017-04-07 MED ORDER — MIDAZOLAM HCL 2 MG/2ML IJ SOLN
INTRAMUSCULAR | Status: AC
  Filled 2017-04-07: qty 2

## 2017-04-07 MED ORDER — ONDANSETRON HCL 4 MG/2ML IJ SOLN: 4.0000 mg | Freq: Four times a day (QID) | INTRAMUSCULAR | Status: DC | PRN

## 2017-04-07 MED ORDER — NITROGLYCERIN 1 MG/10 ML FOR IR/CATH LAB
INTRA_ARTERIAL | Status: DC | PRN
  Administered 2017-04-07: 100 ug via INTRACORONARY

## 2017-04-07 MED ORDER — IOPAMIDOL (ISOVUE-370) INJECTION 76%
INTRAVENOUS | Status: AC
  Filled 2017-04-07: qty 50

## 2017-04-07 MED ORDER — LIDOCAINE HCL (PF) 1 % IJ SOLN
INTRAMUSCULAR | Status: AC
  Filled 2017-04-07: qty 30

## 2017-04-07 MED ORDER — IOPAMIDOL (ISOVUE-370) INJECTION 76%
INTRAVENOUS | Status: AC
  Filled 2017-04-07: qty 100

## 2017-04-07 MED ORDER — SODIUM CHLORIDE 0.9 % IV SOLN: 250.0000 mL | INTRAVENOUS | Status: DC | PRN

## 2017-04-07 MED ORDER — ATROPINE SULFATE 1 MG/10ML IJ SOSY
PREFILLED_SYRINGE | INTRAMUSCULAR | Status: AC
  Filled 2017-04-07: qty 10

## 2017-04-07 SURGICAL SUPPLY — 18 items
BALLN WOLVERINE 2.00X10 (BALLOONS) ×2
BALLN WOLVERINE 2.50X10 (BALLOONS) ×2
BALLN ~~LOC~~ EUPHORA RX 3.25X8 (BALLOONS) ×2
BALLOON WOLVERINE 2.00X10 (BALLOONS) IMPLANT
BALLOON WOLVERINE 2.50X10 (BALLOONS) IMPLANT
BALLOON ~~LOC~~ EUPHORA RX 3.25X8 (BALLOONS) IMPLANT
CATH INFINITI JR4 5F (CATHETERS) ×1 IMPLANT
CATH VISTA GUIDE 6FR IM 90 CM (CATHETERS) ×1 IMPLANT
KIT ENCORE 26 ADVANTAGE (KITS) ×1 IMPLANT
KIT HEART LEFT (KITS) ×2 IMPLANT
PACK CARDIAC CATHETERIZATION (CUSTOM PROCEDURE TRAY) ×2 IMPLANT
SHEATH PINNACLE 6F 10CM (SHEATH) ×1 IMPLANT
STENT SYNERGY DES 3X12 (Permanent Stent) ×1 IMPLANT
TRANSDUCER W/STOPCOCK (MISCELLANEOUS) ×2 IMPLANT
TUBING CIL FLEX 10 FLL-RA (TUBING) ×2 IMPLANT
WIRE EMERALD 3MM-J .035X150CM (WIRE) ×1 IMPLANT
WIRE EMERALD 3MM-J .035X260CM (WIRE) ×1 IMPLANT
WIRE PT2 MS 185 (WIRE) ×1 IMPLANT

## 2017-04-07 NOTE — Progress Notes (Signed)
[facility] Progress Note   Subjective:  Doing well, no c/o'. BP's low, under dry wt.  No sob, cough or abd pain.   Objective Vitals:   04/06/17 2254 04/07/17 0442 04/07/17 0602 04/07/17 0700  BP: (!) 98/48 (!) 81/43 (!) 86/39 (!) 94/49  Pulse:  73  68  Resp: 15 15 (!) 0 16  Temp:  98.5 F (36.9 C)  (!) 97.5 F (36.4 C)  TempSrc:  Oral  Oral  SpO2: 96% 93%  96%  Weight:  53.7 kg (118 lb 6.2 oz)    Height:       Physical Exam General: Sleepy appearing female. NAD. On nasal oxygen. Heart: RRR; 4/6 systolic murmur Lungs: CTA anteriorly Abdomen: soft, non-tender. Foley in place. 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) Dialysis Access: TDC in R chest  Additional Objective Labs: Basic Metabolic Panel: Recent Labs  Lab 04/05/17 0648 04/06/17 1400 04/07/17 0356  NA 136 133* 132*  K 4.0 3.7 3.4*  CL 98* 98* 95*  CO2 23 23 25   GLUCOSE 203* 102* 114*  BUN 24* 54* 23*  CREATININE 3.80* 6.56* 4.18*  CALCIUM 8.8* 8.3* 8.3*  PHOS 5.0* 4.8* 4.0   Liver Function Tests: Recent Labs  Lab 04/04/17 1021  04/05/17 0648 04/06/17 1400 04/07/17 0356  AST 42*  --   --   --   --   ALT 11*  --   --   --   --   ALKPHOS 75  --   --   --   --   BILITOT 0.6  --   --   --   --   PROT 8.2*  --   --   --   --   ALBUMIN 3.5   < > 3.5 2.9* 2.8*   < > = values in this interval not displayed.   Recent Labs  Lab 04/05/17 1812  LIPASE 56*   CBC: Recent Labs  Lab 04/04/17 1349 04/04/17 1430 04/05/17 0648 04/06/17 1400 04/07/17 0356  WBC 11.1* 11.4* 13.1* 6.7 8.0  HGB 10.5* 9.9* 10.6* 9.0* 8.7*  HCT 33.5* 31.8* 34.3* 28.5* 27.6*  MCV 96.3 96.1 95.5 94.7 93.9  PLT 164 187 199 167 157   Cardiac Enzymes: Recent Labs  Lab 04/04/17 1910 04/05/17 0026 04/05/17 1048 04/05/17 1346  CKTOTAL  --   --   --  532*  CKMB  --   --   --  44.5*  TROPONINI >65.00* >65.00* 60.15*  --    CBG: Recent Labs  Lab 04/06/17 0902 04/06/17 1242 04/06/17 1759  04/06/17 2104 04/07/17 0648  GLUCAP 143* 89 140* 102* 129*   Studies/Results: No results found. Medications: . sodium chloride Stopped (04/06/17 1954)  . sodium chloride    . sodium chloride    . sodium chloride    . sodium chloride    . sodium chloride    . sodium chloride    . sodium chloride 10 mL/hr at 04/07/17 0700  . cefTRIAXone (ROCEPHIN)  IV Stopped (04/06/17 1755)  . heparin 950 Units/hr (04/07/17 0700)   . aspirin  81 mg Oral Daily  . atorvastatin  80 mg Oral q1800  . clopidogrel  75 mg Oral Q breakfast  . docusate sodium  100 mg Oral BID  . doxercalciferol  2 mcg Intravenous Q M,W,F-HD  . heparin  4,000 Units Dialysis Once in dialysis  . insulin aspart  0-5 Units Subcutaneous QHS  . insulin aspart  0-9  Units Subcutaneous TID WC  . lanthanum  1,000 mg Oral TID WC  . levothyroxine  25 mcg Oral QAC breakfast  . mouth rinse  15 mL Mouth Rinse BID  . metoCLOPramide (REGLAN) injection  5 mg Intravenous Q12H  . metoprolol tartrate  25 mg Oral BID  . senna-docusate  1 tablet Oral QHS  . sodium chloride flush  3 mL Intravenous Q12H  . sodium chloride flush  3 mL Intravenous Q12H  . sodium chloride flush  3 mL Intravenous Q12H  . sodium chloride flush  3 mL Intravenous Q12H  . thiamine  100 mg Oral Daily    Dialysis Orders: [place] MWF 4h 56kg Hep 4000 R IJ cath BFR400/Auto 1.5, Linear sodium UFP 4 - Hectorol 2 mcg IV TIW (no recent PTH) - Mircera 150 mcg IV q 2 weeks (last dose 50 mcg IV 03/23/17 Last HGB 8.6 04/09/17) - home BP Rx > norvasc 10 qd/ metop 25 bid  Impression: 1. Acute NSTEMI/ prior CABG/ trop > 60 - s/p LHC 12/26 w/ stenotic LIMA graft (PCI today) and native diagonal (rotablator next week).  2. ESRD: cont HD MWF schedule. HD tomorrow.  3. HTN/volume: BP's low, under dry wt , holding BP meds. Vol depletion. Will give NS 1 liter over 10 hrs today.  4. Anemia of CKD: Hgb 10.6, for Aranesp 146mcg tomorrow. . 5. SCCa of bladder: s/p TURBT on 11/20,  didn't get all of tumors due to large size. Was scheduled for cystectomy and B nephrectomy in mid-Jan, now pushed back until 3 mos after PCI.  Per ONC, chemoRx not helpful w/ SCCa bladder.  Surgical plan intent is curative. Per urology, w/ no surgery life expectancy would be about 6- 24 mos. Have d/w patient.   6. CAD (Hx CABG): See above.  Plan:  HD Friday, no fluid off. 1L NS today.     [name] MD [employer] pager [phone]   04/07/2017, 10:50 AM

## 2017-04-07 NOTE — Consult Note (Signed)
Reason for Consult: Bladder Cancer, ESRD  Referring Physician: Candee Furbish MD  Brandi Arellano is an 61 y.o. female.   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 No locally advanced or distant disease on staging imaging. Medical oncology eval Alen Blew) feels minimal role for chemo with this histology.   Was tentatively scheduled for bilateral nephro-ureterectomy and cystectomy 05/05/17.   2 - End Stage Renal Disease - on hemodialysis via Rt [place] perm-cath x few years due to medical renal disease. At baseline makes minimal urine daily.   PMH sig for CAD/CABG, Carotid stent, open chole, lower midline cesarean x3. Rt AKA, Lt partial foot amputation. She live in [place] with her brother and sister who help her with some ADL's and is remarkably independent. 3 daughters, one in [place], one in [place], one in [place].   Today "Brandi Arellano" is seen to discuss further management of her rare / aggressive histology bladder cancer that is symptomatic in setting of ESRD. She is now admitted following large MI, will be requiring new cardiac stents.     Past Medical History:  Diagnosis Date  . Anemia   . Bladder mass 02/09/2017  . CAD (coronary artery disease)    CABG  . CHF (congestive heart failure) (HCC)    Acute on chronic diastolic  . Complication of anesthesia   . Diabetes mellitus without complication (HCC)    Type II  . DVT (deep venous thrombosis) ([place])   . ESRD (end stage renal disease) ([place])    HD M-W-F  . Gangrene of lower extremity (HCC)    RLE  . GERD (gastroesophageal reflux disease)   . Hx of AKA (above knee amputation), right ([place])   . Hyperlipidemia   . Hypertension   . Hypothyroidism   . PONV (postoperative nausea and vomiting)   . Stroke [facility])    "light stroke" no deficits  . Thrombocytopenia ([place])   . Thyroid disease    Abnormal Thyroid function Test  . UTI  (lower urinary tract infection)     Past Surgical History:  Procedure Laterality Date  . ABDOMINAL ANGIOGRAM  12/26/2013   Procedure: ABDOMINAL ANGIOGRAM;  Surgeon: Serafina Hovater, MD;  Location: [facility] CATH LAB;  Service: Cardiovascular;;  . AMPUTATION Right 02/01/2014   Procedure: Right Below Knee Amputation;  Surgeon: Newt Minion, MD;  Location: [place];  Service: Orthopedics;  Laterality: Right;  . AMPUTATION Right 03/24/2014   Procedure: AMPUTATION ABOVE KNEE;  Surgeon: Newt Minion, MD;  Location: [facility];  Service: Orthopedics;  Laterality: Right;  . ARCH AORTOGRAM  12/26/2013   Procedure: ARCH AORTOGRAM;  Surgeon: Serafina Quevedo, MD;  Location: [facility] CATH LAB;  Service: Cardiovascular;;  . AXILLARY ARTERY - BRACHIAL ARTERY BYPASS GRAFT  11/07/2013   At [place]     Bilateral  . CHOLECYSTECTOMY    . CORONARY ARTERY BYPASS GRAFT  2012   in [place] W/ RETROGRADES Left 03/01/2017   Procedure: CYSTOSCOPY WITH RETROGRADE PYELOGRAM;  Surgeon: Irine Seal, MD;  Location: [facility] ORS;  Service: Urology;  Laterality: Left;  . FOOT AMPUTATION Bilateral   . HARDWARE REMOVAL Right 07/19/2014   Procedure: Removal Deep Hardware Right Femur;  Surgeon: Newt Minion, MD;  Location: [facility];  Service: Orthopedics;  Laterality: Right;  Six screws and one condylar plate removed   . hemodialysis  catheter    . INSERTION OF DIALYSIS CATHETER Right 02/07/2017   Procedure: INSERTION OF DIALYSIS CATHETER;  Surgeon: Rosetta Posner, MD;  Location: [place];  Service: Vascular;  Laterality: Right;  . LEFT HEART CATH AND CORONARY ANGIOGRAPHY N/A 04/06/2017   Procedure: LEFT HEART CATH AND CORONARY ANGIOGRAPHY;  Surgeon: Troy Sine, MD;  Location: [place] CV LAB;  Service: Cardiovascular;  Laterality: N/A;  . LEFT HEART CATH AND CORS/GRAFTS ANGIOGRAPHY N/A 02/11/2017   Procedure: LEFT HEART CATH AND CORS/GRAFTS ANGIOGRAPHY;  Surgeon: Nelva Bush, MD;  Location: [facility] CV LAB;  Service: Cardiovascular;  Laterality: N/A;  . LOWER EXTREMITY ANGIOGRAM N/A 12/26/2013   Procedure: LOWER EXTREMITY ANGIOGRAM;  Surgeon: Serafina Roszak, MD;  Location: [facility] CATH LAB;  Service: Cardiovascular;  Laterality: N/A;  . TRANSURETHRAL RESECTION OF BLADDER TUMOR N/A 03/01/2017   Procedure: TRANSURETHRAL RESECTION OF LARGE BLADDER TUMOR (TURBT)FROM BLADDER NECK;  Surgeon: Irine Seal, MD;  Location: [facility];  Service: Urology;  Laterality: N/A;  . UPPER EXTREMITY ANGIOGRAM Right 12/26/2013   Procedure: UPPER EXTREMITY ANGIOGRAM;  Surgeon: Serafina Seefeld, MD;  Location: [facility] CATH LAB;  Service: Cardiovascular;  Laterality: Right;    Family History  Adopted: Yes  Problem Relation Age of Onset  . Hypertension Father   . Heart disease Father        Coronary Artery Disease    Social History:  reports that she has quit smoking. Her smoking use included cigarettes. She quit after 30.00 years of use. she has never used smokeless tobacco. She reports that she does not drink alcohol or use drugs.  Allergies:  Allergies  Allergen Reactions  . Diphenhydramine Nausea And Vomiting  . Tramadol Hcl Nausea And Vomiting  . Morphine And Related Nausea And Vomiting    Medications: I have reviewed the patient's current medications.  Results for orders placed or performed during the hospital encounter of 04/04/17 (from the past 48 hour(s))  Heparin level (unfractionated)     Status: None   Collection Time: 04/05/17 10:48 AM  Result Value Ref Range   Heparin Unfractionated 0.31 0.30 - 0.70 IU/mL    Comment:        IF HEPARIN RESULTS ARE BELOW EXPECTED VALUES, AND PATIENT DOSAGE HAS BEEN CONFIRMED, SUGGEST FOLLOW UP TESTING OF ANTITHROMBIN III LEVELS.   Troponin I (q 6hr x 3)     Status: Abnormal   Collection Time: 04/05/17 10:48 AM  Result Value Ref Range   Troponin I 60.15 (HH) <0.03 ng/mL    Comment: CRITICAL VALUE NOTED.  VALUE IS CONSISTENT WITH PREVIOUSLY REPORTED AND  CALLED VALUE.  Glucose, capillary     Status: Abnormal   Collection Time: 04/05/17 12:55 PM  Result Value Ref Range   Glucose-Capillary 197 (H) 65 - 99 mg/dL  CK total and CKMB (cardiac)not at [facility]     Status: Abnormal   Collection Time: 04/05/17  1:46 PM  Result Value Ref Range   Total CK 532 (H) 38 - 234 U/L   CK, MB 44.5 (H) 0.5 - 5.0 ng/mL   Relative Index 8.4 (H) 0.0 - 2.5  Glucose, capillary     Status: Abnormal   Collection Time: 04/05/17  6:00 PM  Result Value Ref Range   Glucose-Capillary 124 (H) 65 - 99 mg/dL  Lipase, blood     Status: Abnormal   Collection Time: 04/05/17  6:12 PM  Result Value Ref Range   Lipase 56 (H) 11 - 51 U/L  Glucose, capillary     Status: Abnormal   Collection Time: 04/05/17  8:53 PM  Result Value Ref Range   Glucose-Capillary 158 (H) 65 - 99 mg/dL  Urine Culture     Status: None (Preliminary result)   Collection Time: 04/06/17  6:28 AM  Result Value Ref Range   Specimen Description URINE, CATHETERIZED    Special Requests NONE    Culture CULTURE REINCUBATED FOR BETTER GROWTH    Report Status PENDING   Glucose, capillary     Status: Abnormal   Collection Time: 04/06/17  9:02 AM  Result Value Ref Range   Glucose-Capillary 143 (H) 65 - 99 mg/dL  POCT Activated clotting time     Status: None   Collection Time: 04/06/17 11:10 AM  Result Value Ref Range   Activated Clotting Time 120 seconds  Glucose, capillary     Status: None   Collection Time: 04/06/17 12:42 PM  Result Value Ref Range   Glucose-Capillary 89 65 - 99 mg/dL   Comment 1 Notify RN    Comment 2 Document in Chart   CBC     Status: Abnormal   Collection Time: 04/06/17  2:00 PM  Result Value Ref Range   WBC 6.7 4.0 - 10.5 K/uL   RBC 3.01 (L) 3.87 - 5.11 MIL/uL   Hemoglobin 9.0 (L) 12.0 - 15.0 g/dL   HCT 28.5 (L) 36.0 - 46.0 %   MCV 94.7 78.0 - 100.0 fL   MCH 29.9 26.0 - 34.0 pg   MCHC 31.6 30.0 - 36.0 g/dL   RDW 17.7 (H) 11.5 - 15.5 %   Platelets 167 150 - 400 K/uL  Renal  function panel     Status: Abnormal   Collection Time: 04/06/17  2:00 PM  Result Value Ref Range   Sodium 133 (L) 135 - 145 mmol/L   Potassium 3.7 3.5 - 5.1 mmol/L   Chloride 98 (L) 101 - 111 mmol/L   CO2 23 22 - 32 mmol/L   Glucose, Bld 102 (H) 65 - 99 mg/dL   BUN 54 (H) 6 - 20 mg/dL   Creatinine, Ser 6.56 (H) 0.44 - 1.00 mg/dL    Comment: DIALYSIS   Calcium 8.3 (L) 8.9 - 10.3 mg/dL   Phosphorus 4.8 (H) 2.5 - 4.6 mg/dL   Albumin 2.9 (L) 3.5 - 5.0 g/dL   GFR calc non Af Amer 6 (L) >60 mL/min   GFR calc Af Amer 7 (L) >60 mL/min    Comment: (NOTE) The eGFR has been calculated using the CKD EPI equation. This calculation has not been validated in all clinical situations. eGFR's persistently <60 mL/min signify possible Chronic Kidney Disease.    Anion gap 12 5 - 15  Glucose, capillary     Status: Abnormal   Collection Time: 04/06/17  5:59 PM  Result Value Ref Range   Glucose-Capillary 140 (H) 65 - 99 mg/dL   Comment 1 Notify RN    Comment 2 Document in Chart   Glucose, capillary     Status: Abnormal   Collection Time: 04/06/17  9:04 PM  Result Value Ref Range   Glucose-Capillary 102 (H) 65 - 99 mg/dL   Comment 1 Notify RN    Comment 2 Document in Chart   Protime-INR     Status: None   Collection Time: 04/07/17  3:56 AM  Result Value Ref Range   Prothrombin Time 14.4 11.4 - 15.2 seconds   INR 1.13   Heparin level (unfractionated)  Status: None   Collection Time: 04/07/17  3:56 AM  Result Value Ref Range   Heparin Unfractionated 0.42 0.30 - 0.70 IU/mL    Comment:        IF HEPARIN RESULTS ARE BELOW EXPECTED VALUES, AND PATIENT DOSAGE HAS BEEN CONFIRMED, SUGGEST FOLLOW UP TESTING OF ANTITHROMBIN III LEVELS.   Renal function panel     Status: Abnormal   Collection Time: 04/07/17  3:56 AM  Result Value Ref Range   Sodium 132 (L) 135 - 145 mmol/L   Potassium 3.4 (L) 3.5 - 5.1 mmol/L   Chloride 95 (L) 101 - 111 mmol/L   CO2 25 22 - 32 mmol/L   Glucose, Bld 114 (H) 65  - 99 mg/dL   BUN 23 (H) 6 - 20 mg/dL   Creatinine, Ser 4.18 (H) 0.44 - 1.00 mg/dL   Calcium 8.3 (L) 8.9 - 10.3 mg/dL   Phosphorus 4.0 2.5 - 4.6 mg/dL   Albumin 2.8 (L) 3.5 - 5.0 g/dL   GFR calc non Af Amer 11 (L) >60 mL/min   GFR calc Af Amer 12 (L) >60 mL/min    Comment: (NOTE) The eGFR has been calculated using the CKD EPI equation. This calculation has not been validated in all clinical situations. eGFR's persistently <60 mL/min signify possible Chronic Kidney Disease.    Anion gap 12 5 - 15  CBC     Status: Abnormal   Collection Time: 04/07/17  3:56 AM  Result Value Ref Range   WBC 8.0 4.0 - 10.5 K/uL   RBC 2.94 (L) 3.87 - 5.11 MIL/uL   Hemoglobin 8.7 (L) 12.0 - 15.0 g/dL   HCT 27.6 (L) 36.0 - 46.0 %   MCV 93.9 78.0 - 100.0 fL   MCH 29.6 26.0 - 34.0 pg   MCHC 31.5 30.0 - 36.0 g/dL   RDW 17.3 (H) 11.5 - 15.5 %   Platelets 157 150 - 400 K/uL  Glucose, capillary     Status: Abnormal   Collection Time: 04/07/17  6:48 AM  Result Value Ref Range   Glucose-Capillary 129 (H) 65 - 99 mg/dL   Comment 1 Notify RN    Comment 2 Document in Chart     No results found.  Review of Systems  Constitutional: Positive for malaise/fatigue.  HENT: Negative.   Respiratory: Negative.   Cardiovascular: Negative.   Gastrointestinal: Negative.  Negative for vomiting.  Genitourinary: Positive for hematuria.  Musculoskeletal: Negative.   Skin: Negative.   Neurological: Negative.   Endo/Heme/Allergies: Negative.   Psychiatric/Behavioral: Negative.    Blood pressure (!) 94/49, pulse 68, temperature (!) 97.5 F (36.4 C), temperature source Oral, resp. rate 16, height _0  (1.702 m), weight 53.7 kg (118 lb 6.2 oz), SpO2 96 %. Physical Exam  Constitutional: She appears well-developed.  Brother at bedside. She is at baseline. Very pleasant as always.   HENT:  Head: Normocephalic.  Eyes: Pupils are equal, round, and reactive to light.  Neck: Normal range of motion.  Cardiovascular: Normal  rate.  Respiratory: Effort normal.  GI: Soft.  Genitourinary:  Genitourinary Comments: Foley in place with scant urine that is red but not thick or foul.   Musculoskeletal:  S/p Rt leg and Lt foot amputations  Neurological: She is alert.  Skin: Skin is warm.  Psychiatric: She has a normal mood and affect.    Assessment/Plan:  1 - Primary Squamous Cell Bladder Cancer - discussed rare and aggressive histology that is clinically localized. This is known to  be relatively chemoresistant. Options of curative intent surgery (total GUectomy with bilateral nephrectomy, cystectomy, node dissection, hysterectomy, bilatteral oophorectomy), palliative radiation, or purely palliative management. I would estimate life expectance 6-68mo untreated and continued hematuria.   We both agree on reconsidering curative intent surgery likely in April 2019. This will allow for cardiac reperfusion procedures with antiplatelet therapy for few mos, restaging imaging to r/o metastatic progression prior.    2 - End Stage Renal Disease - greatly appreciate nephrology team comanagment. WE will try to coordinate her cancer surgery so that it occurs day after dialysis and prompt transfer to [facility] post-op to allow for further renal replacement therapy in post-op setting (surgery likely in  April-may 2019).    We will arrange for her GU follow up and re-staging imaging.  Please contact me directly with questions anytime.     Recardo Linn 04/07/2017, 10:05 AM

## 2017-04-07 NOTE — Progress Notes (Signed)
Progress Note  Patient Name: Brandi Arellano Date of Encounter: 04/07/2017  Primary Cardiologist: Fransico Him, MD   Subjective   No chest pain or dyspnea. She is not eating or drinking well.   Inpatient Medications    Scheduled Meds: . aspirin  81 mg Oral Daily  . atorvastatin  80 mg Oral q1800  . clopidogrel  75 mg Oral Q breakfast  . docusate sodium  100 mg Oral BID  . doxercalciferol  2 mcg Intravenous Q M,W,F-HD  . heparin  4,000 Units Dialysis Once in dialysis  . insulin aspart  0-5 Units Subcutaneous QHS  . insulin aspart  0-9 Units Subcutaneous TID WC  . lanthanum  1,000 mg Oral TID WC  . levothyroxine  25 mcg Oral QAC breakfast  . mouth rinse  15 mL Mouth Rinse BID  . metoCLOPramide (REGLAN) injection  5 mg Intravenous Q12H  . metoprolol tartrate  25 mg Oral BID  . senna-docusate  1 tablet Oral QHS  . sodium chloride flush  3 mL Intravenous Q12H  . sodium chloride flush  3 mL Intravenous Q12H  . sodium chloride flush  3 mL Intravenous Q12H  . sodium chloride flush  3 mL Intravenous Q12H  . thiamine  100 mg Oral Daily   Continuous Infusions: . sodium chloride Stopped (04/06/17 1954)  . sodium chloride    . sodium chloride    . sodium chloride    . sodium chloride    . sodium chloride    . sodium chloride    . sodium chloride 10 mL/hr at 04/07/17 0700  . cefTRIAXone (ROCEPHIN)  IV Stopped (04/06/17 1755)  . heparin 950 Units/hr (04/07/17 0700)   PRN Meds: sodium chloride, sodium chloride, sodium chloride, sodium chloride, sodium chloride, sodium chloride, sodium chloride, acetaminophen, alteplase, heparin, lanthanum, lidocaine (PF), lidocaine-prilocaine, nitroGLYCERIN, ondansetron **OR** ondansetron (ZOFRAN) IV, oxyCODONE, pentafluoroprop-tetrafluoroeth, sodium chloride flush, sodium chloride flush, sodium chloride flush   Vital Signs    Vitals:   04/06/17 2254 04/07/17 0442 04/07/17 0602 04/07/17 0700  BP: (!) 98/48 (!) 81/43 (!) 86/39 (!) 94/49    Pulse:  73  68  Resp: 15 15 (!) 0 16  Temp:  98.5 F (36.9 C)  (!) 97.5 F (36.4 C)  TempSrc:  Oral  Oral  SpO2: 96% 93%  96%  Weight:  118 lb 6.2 oz (53.7 kg)    Height:        Intake/Output Summary (Last 24 hours) at 04/07/2017 0811 Last data filed at 04/07/2017 0716 Gross per 24 hour  Intake 364.45 ml  Output 1525 ml  Net -1160.55 ml   Filed Weights   04/06/17 1348 04/06/17 1718 04/07/17 0442  Weight: 121 lb 14.6 oz (55.3 kg) 119 lb 0.8 oz (54 kg) 118 lb 6.2 oz (53.7 kg)    Telemetry    Sinus rhythm  - Personally Reviewed  ECG    N/A  Physical Exam   GEN: Ill appearing female in no acute distress.   Neck: No JVD Cardiac: RRR, 2/6 systolic murmurs, rubs, or gallops.  Respiratory: Clear to auscultation bilaterally. GI: Soft, nontender, non-distended  MS: No edema; Left AKA Neuro:  Nonfocal  Psych: Normal affect   Labs    Chemistry Recent Labs  Lab 04/04/17 1021  04/05/17 0648 04/06/17 1400 04/07/17 0356  NA 140   < > 136 133* 132*  K 4.1   < > 4.0 3.7 3.4*  CL 99*   < > 98* 98* 95*  CO2 27   < > 23 23 25   GLUCOSE 281*   < > 203* 102* 114*  BUN 33*   < > 24* 54* 23*  CREATININE 5.10*   < > 3.80* 6.56* 4.18*  CALCIUM 9.7   < > 8.8* 8.3* 8.3*  PROT 8.2*  --   --   --   --   ALBUMIN 3.5   < > 3.5 2.9* 2.8*  AST 42*  --   --   --   --   ALT 11*  --   --   --   --   ALKPHOS 75  --   --   --   --   BILITOT 0.6  --   --   --   --   GFRNONAA 8*   < > 12* 6* 11*  GFRAA 10*   < > 14* 7* 12*  ANIONGAP 14   < > 15 12 12    < > = values in this interval not displayed.     Hematology Recent Labs  Lab 04/05/17 0648 04/06/17 1400 04/07/17 0356  WBC 13.1* 6.7 8.0  RBC 3.59* 3.01* 2.94*  HGB 10.6* 9.0* 8.7*  HCT 34.3* 28.5* 27.6*  MCV 95.5 94.7 93.9  MCH 29.5 29.9 29.6  MCHC 30.9 31.6 31.5  RDW 17.9* 17.7* 17.3*  PLT 199 167 157    Cardiac Enzymes Recent Labs  Lab 04/04/17 1910 04/05/17 0026 04/05/17 1048  TROPONINI >65.00* >65.00* 60.15*     Recent Labs  Lab 04/04/17 1033 04/04/17 1400  TROPIPOC 8.52* >30.00*     Radiology    Dg Chest 2 View  Result Date: 04/05/2017 CLINICAL DATA:  Abdominal pain with nausea and vomiting 3 days. Dry cough. EXAM: CHEST  2 VIEW COMPARISON:  04/04/2017 FINDINGS: Sternotomy wires and right IJ dialysis catheter unchanged. Lungs are adequately inflated with minimal prominence of the perihilar markings suggesting mild vascular congestion. Small amount of posterior right pleural fluid. Cardiomediastinal silhouette and remainder of the exam is unchanged. IMPRESSION: Findings suggesting mild vascular congestion.  Small right effusion. Electronically Signed   By: Marin Olp M.D.   On: 04/05/2017 10:18    Cardiac Studies   LEFT HEART CATH AND CORONARY ANGIOGRAPHY  Conclusion     Ost RCA to Prox RCA lesion is 100% stenosed.  Mid RCA to Dist RCA lesion is 100% stenosed with 100% stenosed side branch in Acute Mrg.  Prox Cx to Mid Cx lesion is 100% stenosed with 100% stenosed side branch in Ost 2nd Mrg.  Ost LAD to Prox LAD lesion is 80% stenosed.  Prox LAD lesion is 90% stenosed.  Mid LAD-1 lesion is 90% stenosed.  Mid LAD-2 lesion is 95% stenosed.  Origin lesion is 80% stenosed.  Ost 1st Diag lesion is 50% stenosed.   Severe native CAD with calcification and evidence for probable 80% calcified stenoses in the very proximal LAD prior to a moderate size bifurcating diagonal vessel with 50% narrowing in the diagonal vessel and 80% in the distal superior branch of this vessel, diffuse 80-90% and 95% multiple stenoses in a severely calcified mid LAD prior to mid-distal diagonal vessel with the LIMA graft inserting beyond this diagonal vessel; total occlusion of the right proximal circumflex after a diminutive first marginal branch, and total occlusion of the RCA at its ostium.  Subclavian stent with mild luminal irregularity.  80% eccentric ostial stenosis in the LIMA which supplies  the distal LAD after mid-distal diagonal vessel.  Patent vein graft supplying the distal marginal vessel of the circumflex coronary artery.  Patent vein graft supplying the distal RCA.  There are septal collaterals from the distal RCA towards the LAD.   RECOMMENDATION: Angiographic findings were reviewed in detail with both Drs. Nekisha Mcdiarmid and [employer].   It appears that the ostial stenosis of the LIMA graft is tighter than previously thought and is at least 80%.  There is also concern that the very proximal LAD which is calcified and at least 80% stenoses jeopardizing a large diagonal vessel, which is not supplied by the LIMA graft.  The patient will undergo dialysis later today.  Discussion was made concerning possible stage intervention to the LIMA ostium and staged atherectomy to the very proximal LAD before the diagonal vessel with medical therapy for the diffuse stenoses beyond the diagonal vessel in the mid LAD.    Diagnostic Diagram      Echo 04/05/17 Study Conclusions  - Left ventricle: The cavity size was normal. Wall thickness was   increased in a pattern of moderate LVH. Systolic function was   mildly to moderately reduced. The estimated ejection fraction was   in the range of 40% to 45%. Diffuse hypokinesis. Features are   consistent with a pseudonormal left ventricular filling pattern,   with concomitant abnormal relaxation and increased filling   pressure (grade 2 diastolic dysfunction). - Aortic valve: Trileaflet; severely thickened, severely calcified   leaflets. Valve mobility was restricted. There was mild to   moderate stenosis. There was trivial regurgitation. Peak velocity   (S): 274 cm/s. Mean gradient (S): 17 mm Hg. - Mitral valve: Severely calcified annulus. Mildly thickened   leaflets . - Left atrium: The atrium was mildly dilated. Volume/bsa, S: 38.7   ml/m^2.  Impressions:  - EF is reduced when compared to prior (45-50%)  Patient Profile     61 y.o.  female with coronary artery disease status post CABG with cardiac catheterization performed last month showing 50% ostial stenosis of LIMA to LAD with patent SVG to OM, SVG to RCA but both graft supply small diffusely diseased vessels with mild to moderate aortic stenosis as well.  Her native vessels are heavily calcified and diffusely diseased.  Currently having non-ST elevation myocardial infarction with symptoms of nausea and vomiting.  Denying any active chest pain.  Assessment & Plan    1. NSTEMI - Troponin >65. Cath showed ostial LIMA 80% stenosis with subclavian stent proximal to this and tight diagonal lesions. Detailed report as above. Plan for PCI of ostial LIMA today and staged PCI of diagonal with Rotablator next week.  - Chest pain free. Continue IV heparin, ASA, Plavix, and BB.   2. Mild to moderate AS - Follow with yearly echo  3. ESRD on HD - Per nephrology  4. Invasive Bladder cancer - Scheduled to perform extensive GU resection on 05/05/16. Needs to prost pond.   5. Hypotension - Likely due to loss of appetite. Nurse has offered multiple food choice however she has declined all.   For questions or updates, please contact [place] Please consult [email or web address] for contact info under Cardiology/STEMI.      SignedLeanor Kail, PA  04/07/2017, 8:11 AM    Personally seen and examined. Agree with above.  Plan ostial LIMA to LAD intervention, PCI today Spoke with Zitlali Primm, daughter in law [phone] Understands risks.  Alert, RRR  Candee Furbish, MD

## 2017-04-07 NOTE — Discharge Summary (Signed)
[facility] Discharge Summary  Patient name: Brandi Arellano Medical record number: [id number] Date of birth: 1955/08/27 Age: 61 y.o. Gender: female Date of Admission: 04/04/2017  Date of Discharge: 04/11/2017 Admitting Physician: Dickie La, MD  Primary Care Provider: Kerin Perna, NP Consultants: Nephrology, cardiology  Indication for Hospitalization: Hypoxia  Discharge Diagnoses/Problem List:  Acute Hypoxia-resolved NSTEMI Mild to moderate AS Pyuria Nausea/vomiting ESRD, MWF HD Invasive bladder cancer HTN T2DM Hypothyroidism HLD Depression  Disposition: Home  Discharge Condition: Stable, improved  Discharge Exam:  General: awake and alert, NAD, pleasant, sitting in HD Cardiovascular: 3/6 systolic murmur, no MRG, dialysis catheter in right chest Respiratory: CTAB, no wheezes, rales or rhonchi  Abdomen: soft, non tender, non distended, bowel sounds present Extremities: non tender, 5/5 strength in all remaining extremities, 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 female presenting with nausea, vomiting, and presented hypoxic in ED (satting in 37s).   CXR on admission showed mild vascular congestion and small right effusion, no signs of pneumonia. Mild leukocytosis on admission resolved prior to discharge. Hypoxia was likely 2/2 acute coronary event on admission.  During admission patient had elevated troponins (>65.00 > >65 > 60.15), with a prior history of CAD. EKG showed LBBB. Cardiology consulted and performed PCI of ostial LIMA on 12/27. Patient was kept on heparin gtt and transitioned to ASA, plavix, and metoprolol. Echo did reveal mild to moderate aortic stenosis.   On presentation patient also presenting with pyuria and suprapubic tenderness. Was treated with IV CTX for a total of 5 days. Urine cultures growing enterococcus and strep viridans.   Patient had KUB  on admission due to recurrent nausea/vomiting. KUB showed large stool burden and possible fecal impaction. Enema was held initially to avoid excess stress given cardiac symptoms.  Patient with history of primary squamous cell bladder cancer. Patient was seen by urology while inpatient who recommended curative intent surgery, palliative radiation, or purely palliative management.   Issues for Follow Up:  1. Patient displayed signs of depression during admission. Not previously on anti-depressant. Recommend re-evaluating depression (PHQ-9) and determining titration of anti-depressant medications  2. Yearly echo for mild to moderate Aortic stenosis 3. Outpatient follow up for primary squamous cell bladder cancer, patient resistant to palliative medicine.  4. Outpatient follow up with cardiology   Significant Procedures:  PCI of ostial LIMA on 12/27   Significant Labs and Imaging:  Recent Labs  Lab 04/09/17 0328 04/10/17 0333 04/11/17 0630  WBC 5.0 5.7 6.0  HGB 7.7* 7.6* 7.1*  HCT 23.8* 24.2* 22.3*  PLT 135* 138* 145*   Recent Labs  Lab 04/07/17 0356 04/08/17 0319 04/09/17 0328 04/10/17 0333 04/11/17 0500  NA 132* 129* 130* 130* 130*  K 3.4* 3.6 3.8 4.0 4.0  CL 95* 93* 96* 95* 92*  CO2 25 22 23 26 25   GLUCOSE 114* 180* 173* 190* 152*  BUN 23* 41* 23* 34* 40*  CREATININE 4.18* 6.46* 4.28* 6.23* 7.85*  CALCIUM 8.3* 8.1* 8.3* 8.4* 8.1*  PHOS 4.0 6.4* 3.5 3.4 4.0  ALBUMIN 2.8* 2.8* 2.8* 2.7* 2.5*     Ref. Range 04/04/2017 19:10 04/05/2017 00:26 04/05/2017 10:48  Troponin I Latest Ref Range: <0.03 ng/mL >65.00 (HH) >65.00 (HH) 60.15 (HH)    Ref. Range 04/05/2017 13:46  CK Total Latest Ref Range: 38 - 234 U/L 532 (H)  CK, MB Latest Ref Range: 0.5 - 5.0 ng/mL 44.5 (  H)    Ref. Range 04/05/2017 00:26 04/05/2017 06:48  TSH Latest Ref Range: 0.350 - 4.500 uIU/mL 5.737 (H)   T4,Free(Direct) Latest Ref Range: 0.61 - 1.12 ng/dL  0.97   CBG (last 3)  Recent Labs    04/10/17 1616  04/10/17 2137 04/11/17 1111  GLUCAP 237* 139* 132*     Dg Chest 2 View  Result Date: 04/05/2017 CLINICAL DATA:  Abdominal pain with nausea and vomiting 3 days. Dry cough. EXAM: CHEST  2 VIEW COMPARISON:  04/04/2017 FINDINGS: Sternotomy wires and right IJ dialysis catheter unchanged. Lungs are adequately inflated with minimal prominence of the perihilar markings suggesting mild vascular congestion. Small amount of posterior right pleural fluid. Cardiomediastinal silhouette and remainder of the exam is unchanged. IMPRESSION: Findings suggesting mild vascular congestion.  Small right effusion. Electronically Signed   By: Marin Olp M.D.   On: 04/05/2017 10:18   Abd 1 View (kub)  Result Date: 04/04/2017 CLINICAL DATA:  61 year old female with abdominal pain, nausea vomiting. EXAM: ABDOMEN - 1 VIEW COMPARISON:  CT of the abdomen pelvis dated 02/06/2017 FINDINGS: There is no bowel dilatation or evidence of obstruction. No free air identified on the provided images. There is extensive atherosclerotic calcification of the aorta and mesenteric vasculature. Large amount of stool noted in the rectal vault. The osseous structures and soft tissues appear unremarkable. IMPRESSION: Large amount of stool in the rectal vault may represent fecal impaction. No evidence of obstruction. Electronically Signed   By: Anner Crete M.D.   On: 04/04/2017 21:49   Dg Chest Portable 1 View  Result Date: 04/04/2017 CLINICAL DATA:  Hypoxia. EXAM: PORTABLE CHEST 1 VIEW COMPARISON:  Radiograph of February 07, 2017. FINDINGS: Stable cardiomegaly with central pulmonary vascular congestion. Atherosclerosis of thoracic aorta is noted. Bilateral pulmonary edema is noted with right worse than left. No pneumothorax or pleural effusion is noted. Right internal jugular dialysis catheter is unchanged in position. Sternotomy wires are noted. Bony thorax is unremarkable. IMPRESSION: Bilateral pulmonary edema is noted, right worse than  left. Aortic atherosclerosis. Electronically Signed   By: Marijo Conception, M.D.   On: 04/04/2017 10:48     Results/Tests Pending at Time of Discharge: None  Discharge Medications:  Allergies as of 04/11/2017      Reactions   Diphenhydramine Nausea And Vomiting   Tramadol Hcl Nausea And Vomiting   Morphine And Related Nausea And Vomiting      Medication List    TAKE these medications   amLODipine 10 MG tablet Commonly known as:  NORVASC Take 10 mg by mouth daily with breakfast.   aspirin EC 81 MG tablet Take 81 mg by mouth daily with breakfast.   atorvastatin 40 MG tablet Commonly known as:  LIPITOR Take 40 mg by mouth at bedtime.   citalopram 20 MG tablet Commonly known as:  CELEXA Take 1 tablet (20 mg total) by mouth daily. Start taking on:  04/12/2017   clopidogrel 75 MG tablet Commonly known as:  PLAVIX Take 1 tablet (75 mg total) by mouth daily with breakfast. Start taking on:  04/12/2017   lanthanum 1000 MG chewable tablet Commonly known as:  FOSRENOL Chew 1,000 mg by mouth See admin instructions. Crush and sprinkle over food one tablet (1000 mg) three times daily with meals   levothyroxine 25 MCG tablet Commonly known as:  SYNTHROID, LEVOTHROID Take 25 mcg by mouth daily with breakfast.   metoprolol tartrate 25 MG tablet Commonly known as:  LOPRESSOR Take 1 tablet (25  mg total) by mouth 2 (two) times daily. What changed:    when to take this  additional instructions   senna-docusate 8.6-50 MG tablet Commonly known as:  Senokot-S Take 1 tablet by mouth at bedtime as needed for mild constipation.   thiamine 100 MG tablet Take 1 tablet (100 mg total) by mouth daily. What changed:    when to take this  additional instructions       Discharge Instructions: Please refer to Patient Instructions section of EMR for full details.  Patient was counseled important signs and symptoms that should prompt return to medical care, changes in medications, dietary  instructions, activity restrictions, and follow up appointments.   Follow-Up Appointments: Follow-up Information    Lyda Jester M, PA-C. Go on 04/18/2017.   Specialties:  Cardiology, Radiology Why:  @ 9:30 am for TCM follow up. Please arrive 15 minutes early Contact information: [address] [place] [address] [phone]           McMullen, [place], DO 04/11/2017, 9:07 PM PGY-2, [place]

## 2017-04-07 NOTE — Progress Notes (Signed)
Family Medicine Teaching Service Daily Progress Note Intern Pager: [phone]  Patient name: Brandi Arellano Medical record number: [id number] Date of birth: 1955-10-19 Age: 61 y.o. Gender: female  Primary Care Provider: Kerin Perna, NP Consultants: nephrology, cardiology, urology  Code Status: partial DNI   [name]:  Admitted to [facility] on 04/04/16 for nausea/vomiting and hypoxia   Assessment and Plan: [name] a 61 y.o.femalepresenting with nausea and vomiting, found to be hypoxic with bilateral pleural effusions. PMH is significant forESRD, HTN, T2DM, invasive bladder cancer  Acute Hypoxia-resolved Sats at 96% on RA. No history of oxygen requirement.CXR showing mild vascular congestion and small right effusion. Mild leukocytosis on admission that is now resolved, patient is afebrile, no signs of pneumonia on CXR. Hypoxia may be secondary to acute coronary event. Patient did emphasize on admission she is DNI. -continuous pulse ox and cardiac monitoring -supplemental O2 as needed to keep sats > 90%  NSTEMI  Troponin 8.52> 30>(>65)>(>65)>60. Hx of CAD. EKG with LBBB.  Cardiology was consulted and started patient on heparin drip. EKG similar to previous. Total CK 532. Cardiac cath showing ostial LIMA 80% stenosis with subclavian stent proximal with tight diagonal lesions.  -Cardiology consulted, appreciate recommendations: plan for PCI of ostial LIMA on 12/27 and staged PCI of diagonal with rotablatero next week. Recommend continuing IV heparin, ASA, plavix, and BB  -Continue Metoprolol and Lipitor  -prn nitro ordered  Mild to moderate AS Echo showing mild to moderate aortic stenosis. Recommended by cardiology for yearly echo -appreciate cardiology recommendations   Pyuria Suprapubic tenderness. Will treat for UTI  -Urine culture pending, reincubated for better growth  -Ceftriaxone 1 gram daily   Nausea and vomiting Resolved. KUB  with a large amount of stool in the rectal vault may represent fecal impaction. No evidence of obstruction.Also with known invasive bladder cancer that may be causing pain. -Will need an enema in the future given concern for fecal impaction - will hold off as concern for vasovagal response given patient's tentative cardiac status, NO FLEET -Will give metoclopramide 5 mg BID, dose adjusted for kidney function  -Zofran as needed for nausea  -oxy IR 5 mg every  3 hour prn   ESRD, MWF HD Last HD on 12/27 -nephrology following, appreciate recommendations  -daily renal function panels  Invasive bladder cancer chronic indwelling Foley in place -patient was to follow up w/ urology in January -seen by urology this am, note is pending   HTN Hypotensive overnight but currently BP 144/56 -Will hold amlodipine for now  -continuemetop -fluid bolus as needed   T2DM last A1C 6.8 02/07/17. Not on any home meds. Glucose 12/26 140s-150s -sensitiveSSI -CBGs AC/HS  Hypothyroidism stable, on synthroid -continue home synthroid -check TSH  HLD stable, on lipitor -continue home medication  Depression Patient reported by nursing staff to be depressed and have decreased appetite due to depression. -will start celexa 20mg  daily   FEN/GI:renal/carb modified Prophylaxis:heparin  Disposition: awaiting PCI this afternoon   Subjective:  Patient today states improvement. Denies further nausea, vomiting, or diarrhea. States some "soreness" in stomach related to prior vomiting episodes. Patient states she vomiting in HD as she has a "sensitive stomach" and another patient in HD had just vomited. Denies SOB or CP. States she used to have burning with urination, but this has resolved. Per nursing staff patient has been refusing to eat, even when offered foods like ice cream. Nursing staff believes patient is depressed.   Objective: Temp:  [  97.2 F (36.2 C)-98.5 F (36.9 C)] 98 F (36.7 C)  (12/27 1058) Pulse Rate:  [56-89] 71 (12/27 1058) Resp:  [0-21] 21 (12/27 1100) BP: (70-151)/(39-80) 144/56 (12/27 1100) SpO2:  [90 %-100 %] 94 % (12/27 1058) Weight:  [118 lb 6.2 oz (53.7 kg)-121 lb 14.6 oz (55.3 kg)] 118 lb 6.2 oz (53.7 kg) (12/27 0442) Physical Exam: General: awake and alert, laying in bed, NAD  Cardiovascular: 3/6 systolic murmur, regular rate Respiratory: CTAB, no wheezes, rales, or rhonchi  Abdomen: soft, non tender, non distended, bowel sounds normal  Extremities: soft, non tender, no edema   Laboratory: Recent Labs  Lab 04/05/17 0648 04/06/17 1400 04/07/17 0356  WBC 13.1* 6.7 8.0  HGB 10.6* 9.0* 8.7*  HCT 34.3* 28.5* 27.6*  PLT 199 167 157   Recent Labs  Lab 04/04/17 1021  04/05/17 0648 04/06/17 1400 04/07/17 0356  NA 140   < > 136 133* 132*  K 4.1   < > 4.0 3.7 3.4*  CL 99*   < > 98* 98* 95*  CO2 27   < > 23 23 25   BUN 33*   < > 24* 54* 23*  CREATININE 5.10*   < > 3.80* 6.56* 4.18*  CALCIUM 9.7   < > 8.8* 8.3* 8.3*  PROT 8.2*  --   --   --   --   BILITOT 0.6  --   --   --   --   ALKPHOS 75  --   --   --   --   ALT 11*  --   --   --   --   AST 42*  --   --   --   --   GLUCOSE 281*   < > 203* 102* 114*   < > = values in this interval not displayed.    CBG (last 3)  Recent Labs    04/07/17 0648 04/07/17 1101 04/07/17 1140  GLUCAP 129* 67 62*     Imaging/Diagnostic Tests: Dg Chest 2 View  Result Date: 04/05/2017 CLINICAL DATA:  Abdominal pain with nausea and vomiting 3 days. Dry cough. EXAM: CHEST  2 VIEW COMPARISON:  04/04/2017 FINDINGS: Sternotomy wires and right IJ dialysis catheter unchanged. Lungs are adequately inflated with minimal prominence of the perihilar markings suggesting mild vascular congestion. Small amount of posterior right pleural fluid. Cardiomediastinal silhouette and remainder of the exam is unchanged. IMPRESSION: Findings suggesting mild vascular congestion.  Small right effusion. Electronically Signed   By:  Marin Olp M.D.   On: 04/05/2017 10:18   Abd 1 View (kub)  Result Date: 04/04/2017 CLINICAL DATA:  61 year old female with abdominal pain, nausea vomiting. EXAM: ABDOMEN - 1 VIEW COMPARISON:  CT of the abdomen pelvis dated 02/06/2017 FINDINGS: There is no bowel dilatation or evidence of obstruction. No free air identified on the provided images. There is extensive atherosclerotic calcification of the aorta and mesenteric vasculature. Large amount of stool noted in the rectal vault. The osseous structures and soft tissues appear unremarkable. IMPRESSION: Large amount of stool in the rectal vault may represent fecal impaction. No evidence of obstruction. Electronically Signed   By: Anner Crete M.D.   On: 04/04/2017 21:49   Dg Chest Portable 1 View  Result Date: 04/04/2017 CLINICAL DATA:  Hypoxia. EXAM: PORTABLE CHEST 1 VIEW COMPARISON:  Radiograph of February 07, 2017. FINDINGS: Stable cardiomegaly with central pulmonary vascular congestion. Atherosclerosis of thoracic aorta is noted. Bilateral pulmonary edema is noted with right worse  than left. No pneumothorax or pleural effusion is noted. Right internal jugular dialysis catheter is unchanged in position. Sternotomy wires are noted. Bony thorax is unremarkable. IMPRESSION: Bilateral pulmonary edema is noted, right worse than left. Aortic atherosclerosis. Electronically Signed   By: Marijo Conception, M.D.   On: 04/04/2017 10:48     Caroline More, DO 04/07/2017, 11:54 AM PGY-1, [facility] Intern pager: [phone], text pages welcome

## 2017-04-07 NOTE — Progress Notes (Signed)
Patient had cbg of 52 1101, Dr. Tammi Klippel notified and ordered to recheck blood sugar and notify her of value.  Rechecked cbg at 1140, value 62. Given 1/2 amp of dextrose at 1155per hypoglycemic protocol. Rechecked cbg at 1214 was 142. Will check at 1400 to ensure stable bs before cardiac intervention scheduled at 1500.

## 2017-04-07 NOTE — H&P (View-Only) (Signed)
Progress Note  Patient Name: Brandi Arellano Date of Encounter: 04/07/2017  Primary Cardiologist: Fransico Him, MD   Subjective   No chest pain or dyspnea. She is not eating or drinking well.   Inpatient Medications    Scheduled Meds: . aspirin  81 mg Oral Daily  . atorvastatin  80 mg Oral q1800  . clopidogrel  75 mg Oral Q breakfast  . docusate sodium  100 mg Oral BID  . doxercalciferol  2 mcg Intravenous Q M,W,F-HD  . heparin  4,000 Units Dialysis Once in dialysis  . insulin aspart  0-5 Units Subcutaneous QHS  . insulin aspart  0-9 Units Subcutaneous TID WC  . lanthanum  1,000 mg Oral TID WC  . levothyroxine  25 mcg Oral QAC breakfast  . mouth rinse  15 mL Mouth Rinse BID  . metoCLOPramide (REGLAN) injection  5 mg Intravenous Q12H  . metoprolol tartrate  25 mg Oral BID  . senna-docusate  1 tablet Oral QHS  . sodium chloride flush  3 mL Intravenous Q12H  . sodium chloride flush  3 mL Intravenous Q12H  . sodium chloride flush  3 mL Intravenous Q12H  . sodium chloride flush  3 mL Intravenous Q12H  . thiamine  100 mg Oral Daily   Continuous Infusions: . sodium chloride Stopped (04/06/17 1954)  . sodium chloride    . sodium chloride    . sodium chloride    . sodium chloride    . sodium chloride    . sodium chloride    . sodium chloride 10 mL/hr at 04/07/17 0700  . cefTRIAXone (ROCEPHIN)  IV Stopped (04/06/17 1755)  . heparin 950 Units/hr (04/07/17 0700)   PRN Meds: sodium chloride, sodium chloride, sodium chloride, sodium chloride, sodium chloride, sodium chloride, sodium chloride, acetaminophen, alteplase, heparin, lanthanum, lidocaine (PF), lidocaine-prilocaine, nitroGLYCERIN, ondansetron **OR** ondansetron (ZOFRAN) IV, oxyCODONE, pentafluoroprop-tetrafluoroeth, sodium chloride flush, sodium chloride flush, sodium chloride flush   Vital Signs    Vitals:   04/06/17 2254 04/07/17 0442 04/07/17 0602 04/07/17 0700  BP: (!) 98/48 (!) 81/43 (!) 86/39 (!) 94/49    Pulse:  73  68  Resp: 15 15 (!) 0 16  Temp:  98.5 F (36.9 C)  (!) 97.5 F (36.4 C)  TempSrc:  Oral  Oral  SpO2: 96% 93%  96%  Weight:  118 lb 6.2 oz (53.7 kg)    Height:        Intake/Output Summary (Last 24 hours) at 04/07/2017 0811 Last data filed at 04/07/2017 0716 Gross per 24 hour  Intake 364.45 ml  Output 1525 ml  Net -1160.55 ml   Filed Weights   04/06/17 1348 04/06/17 1718 04/07/17 0442  Weight: 121 lb 14.6 oz (55.3 kg) 119 lb 0.8 oz (54 kg) 118 lb 6.2 oz (53.7 kg)    Telemetry    Sinus rhythm  - Personally Reviewed  ECG    N/A  Physical Exam   GEN: Ill appearing female in no acute distress.   Neck: No JVD Cardiac: RRR, 2/6 systolic murmurs, rubs, or gallops.  Respiratory: Clear to auscultation bilaterally. GI: Soft, nontender, non-distended  MS: No edema; Left AKA Neuro:  Nonfocal  Psych: Normal affect   Labs    Chemistry Recent Labs  Lab 04/04/17 1021  04/05/17 0648 04/06/17 1400 04/07/17 0356  NA 140   < > 136 133* 132*  K 4.1   < > 4.0 3.7 3.4*  CL 99*   < > 98* 98* 95*  CO2 27   < > 23 23 25   GLUCOSE 281*   < > 203* 102* 114*  BUN 33*   < > 24* 54* 23*  CREATININE 5.10*   < > 3.80* 6.56* 4.18*  CALCIUM 9.7   < > 8.8* 8.3* 8.3*  PROT 8.2*  --   --   --   --   ALBUMIN 3.5   < > 3.5 2.9* 2.8*  AST 42*  --   --   --   --   ALT 11*  --   --   --   --   ALKPHOS 75  --   --   --   --   BILITOT 0.6  --   --   --   --   GFRNONAA 8*   < > 12* 6* 11*  GFRAA 10*   < > 14* 7* 12*  ANIONGAP 14   < > 15 12 12    < > = values in this interval not displayed.     Hematology Recent Labs  Lab 04/05/17 0648 04/06/17 1400 04/07/17 0356  WBC 13.1* 6.7 8.0  RBC 3.59* 3.01* 2.94*  HGB 10.6* 9.0* 8.7*  HCT 34.3* 28.5* 27.6*  MCV 95.5 94.7 93.9  MCH 29.5 29.9 29.6  MCHC 30.9 31.6 31.5  RDW 17.9* 17.7* 17.3*  PLT 199 167 157    Cardiac Enzymes Recent Labs  Lab 04/04/17 1910 04/05/17 0026 04/05/17 1048  TROPONINI >65.00* >65.00* 60.15*     Recent Labs  Lab 04/04/17 1033 04/04/17 1400  TROPIPOC 8.52* >30.00*     Radiology    Dg Chest 2 View  Result Date: 04/05/2017 CLINICAL DATA:  Abdominal pain with nausea and vomiting 3 days. Dry cough. EXAM: CHEST  2 VIEW COMPARISON:  04/04/2017 FINDINGS: Sternotomy wires and right IJ dialysis catheter unchanged. Lungs are adequately inflated with minimal prominence of the perihilar markings suggesting mild vascular congestion. Small amount of posterior right pleural fluid. Cardiomediastinal silhouette and remainder of the exam is unchanged. IMPRESSION: Findings suggesting mild vascular congestion.  Small right effusion. Electronically Signed   By: Marin Olp M.D.   On: 04/05/2017 10:18    Cardiac Studies   LEFT HEART CATH AND CORONARY ANGIOGRAPHY  Conclusion     Ost RCA to Prox RCA lesion is 100% stenosed.  Mid RCA to Dist RCA lesion is 100% stenosed with 100% stenosed side branch in Acute Mrg.  Prox Cx to Mid Cx lesion is 100% stenosed with 100% stenosed side branch in Ost 2nd Mrg.  Ost LAD to Prox LAD lesion is 80% stenosed.  Prox LAD lesion is 90% stenosed.  Mid LAD-1 lesion is 90% stenosed.  Mid LAD-2 lesion is 95% stenosed.  Origin lesion is 80% stenosed.  Ost 1st Diag lesion is 50% stenosed.   Severe native CAD with calcification and evidence for probable 80% calcified stenoses in the very proximal LAD prior to a moderate size bifurcating diagonal vessel with 50% narrowing in the diagonal vessel and 80% in the distal superior branch of this vessel, diffuse 80-90% and 95% multiple stenoses in a severely calcified mid LAD prior to mid-distal diagonal vessel with the LIMA graft inserting beyond this diagonal vessel; total occlusion of the right proximal circumflex after a diminutive first marginal branch, and total occlusion of the RCA at its ostium.  Subclavian stent with mild luminal irregularity.  80% eccentric ostial stenosis in the LIMA which supplies  the distal LAD after mid-distal diagonal vessel.  Patent vein graft supplying the distal marginal vessel of the circumflex coronary artery.  Patent vein graft supplying the distal RCA.  There are septal collaterals from the distal RCA towards the LAD.   RECOMMENDATION: Angiographic findings were reviewed in detail with both Drs. Jarah Pember and [employer].   It appears that the ostial stenosis of the LIMA graft is tighter than previously thought and is at least 80%.  There is also concern that the very proximal LAD which is calcified and at least 80% stenoses jeopardizing a large diagonal vessel, which is not supplied by the LIMA graft.  The patient will undergo dialysis later today.  Discussion was made concerning possible stage intervention to the LIMA ostium and staged atherectomy to the very proximal LAD before the diagonal vessel with medical therapy for the diffuse stenoses beyond the diagonal vessel in the mid LAD.    Diagnostic Diagram      Echo 04/05/17 Study Conclusions  - Left ventricle: The cavity size was normal. Wall thickness was   increased in a pattern of moderate LVH. Systolic function was   mildly to moderately reduced. The estimated ejection fraction was   in the range of 40% to 45%. Diffuse hypokinesis. Features are   consistent with a pseudonormal left ventricular filling pattern,   with concomitant abnormal relaxation and increased filling   pressure (grade 2 diastolic dysfunction). - Aortic valve: Trileaflet; severely thickened, severely calcified   leaflets. Valve mobility was restricted. There was mild to   moderate stenosis. There was trivial regurgitation. Peak velocity   (S): 274 cm/s. Mean gradient (S): 17 mm Hg. - Mitral valve: Severely calcified annulus. Mildly thickened   leaflets . - Left atrium: The atrium was mildly dilated. Volume/bsa, S: 38.7   ml/m^2.  Impressions:  - EF is reduced when compared to prior (45-50%)  Patient Profile     61 y.o.  female with coronary artery disease status post CABG with cardiac catheterization performed last month showing 50% ostial stenosis of LIMA to LAD with patent SVG to OM, SVG to RCA but both graft supply small diffusely diseased vessels with mild to moderate aortic stenosis as well.  Her native vessels are heavily calcified and diffusely diseased.  Currently having non-ST elevation myocardial infarction with symptoms of nausea and vomiting.  Denying any active chest pain.  Assessment & Plan    1. NSTEMI - Troponin >65. Cath showed ostial LIMA 80% stenosis with subclavian stent proximal to this and tight diagonal lesions. Detailed report as above. Plan for PCI of ostial LIMA today and staged PCI of diagonal with Rotablator next week.  - Chest pain free. Continue IV heparin, ASA, Plavix, and BB.   2. Mild to moderate AS - Follow with yearly echo  3. ESRD on HD - Per nephrology  4. Invasive Bladder cancer - Scheduled to perform extensive GU resection on 05/05/16. Needs to prost pond.   5. Hypotension - Likely due to loss of appetite. Nurse has offered multiple food choice however she has declined all.   For questions or updates, please contact [facility] Please consult [email or web address] for contact info under Cardiology/STEMI.      SignedLeanor Kail, PA  04/07/2017, 8:11 AM    Personally seen and examined. Agree with above.  Plan ostial LIMA to LAD intervention, PCI today Spoke with Kenslie Abbruzzese, daughter in law [phone] Understands risks.  Alert, RRR  Candee Furbish, MD

## 2017-04-07 NOTE — Progress Notes (Signed)
ANTICOAGULATION CONSULT NOTE - Follow Up Consult  Pharmacy Consult for heparin Indication: chest pain/ACS  Allergies  Allergen Reactions  . Diphenhydramine Nausea And Vomiting  . Tramadol Hcl Nausea And Vomiting  . Morphine And Related Nausea And Vomiting    Patient Measurements: Height: 5\' 7"  (170.2 cm) Weight: 118 lb 6.2 oz (53.7 kg) IBW/kg (Calculated) : 61.6 Heparin Dosing Weight: 61.6 kg  Vital Signs: Temp: 97.5 F (36.4 C) (12/27 0700) Temp Source: Oral (12/27 0700) BP: 94/49 (12/27 0700) Pulse Rate: 68 (12/27 0700)  Labs: Recent Labs    04/04/17 1910 04/05/17 0026 04/05/17 0648 04/05/17 1048 04/05/17 1346 04/06/17 1400 04/07/17 0356  HGB  --   --  10.6*  --   --  9.0* 8.7*  HCT  --   --  34.3*  --   --  28.5* 27.6*  PLT  --   --  199  --   --  167 157  LABPROT  --   --   --   --   --   --  14.4  INR  --   --   --   --   --   --  1.13  HEPARINUNFRC  --  0.28*  --  0.31  --   --  0.42  CREATININE  --   --  3.80*  --   --  6.56* 4.18*  CKTOTAL  --   --   --   --  532*  --   --   CKMB  --   --   --   --  44.5*  --   --   TROPONINI >65.00* >65.00*  --  60.15*  --   --   --     Estimated Creatinine Clearance: 12.1 mL/min (A) (by C-G formula based on SCr of 4.18 mg/dL (H)).   Medications:  Scheduled:  . aspirin  81 mg Oral Daily  . atorvastatin  80 mg Oral q1800  . clopidogrel  75 mg Oral Q breakfast  . docusate sodium  100 mg Oral BID  . doxercalciferol  2 mcg Intravenous Q M,W,F-HD  . heparin  4,000 Units Dialysis Once in dialysis  . insulin aspart  0-5 Units Subcutaneous QHS  . insulin aspart  0-9 Units Subcutaneous TID WC  . lanthanum  1,000 mg Oral TID WC  . levothyroxine  25 mcg Oral QAC breakfast  . mouth rinse  15 mL Mouth Rinse BID  . metoCLOPramide (REGLAN) injection  5 mg Intravenous Q12H  . metoprolol tartrate  25 mg Oral BID  . senna-docusate  1 tablet Oral QHS  . sodium chloride flush  3 mL Intravenous Q12H  . sodium chloride flush  3  mL Intravenous Q12H  . sodium chloride flush  3 mL Intravenous Q12H  . sodium chloride flush  3 mL Intravenous Q12H  . thiamine  100 mg Oral Daily   Infusions:  . sodium chloride Stopped (04/06/17 1954)  . sodium chloride    . sodium chloride    . sodium chloride    . sodium chloride    . sodium chloride    . sodium chloride    . sodium chloride 10 mL/hr at 04/07/17 0700  . cefTRIAXone (ROCEPHIN)  IV Stopped (04/06/17 1755)  . heparin 950 Units/hr (04/07/17 0700)    Assessment: 61 y/o female on IV heparin for NSTEMI, s/p cardiac cath which revealed blockages to ostial LIMA and diagonal branch. Plan for DES to ostial LIMA today,  staged PCI to diagonal branch next week. Heparin resumed 8 hours post-sheath removal yesterday. Heparin level therapeutic at 0.42 this morning. CBC is low, decreasing since 12/25 but no bleeding noted.  Goal of Therapy:  Heparin level 0.3-0.7 units/ml Monitor platelets by anticoagulation protocol: Yes   Plan:  Continue heparin 950 units/hr Daily heparin level and CBC while on heparin Monitor s/sx of bleeding F/u cardiology plans post-DES today   Charlene Brooke, PharmD PGY1 Pharmacy Resident Phone: [phone] After 4:30PM please call [facility] [phone] 04/07/2017,9:39 AM

## 2017-04-07 NOTE — Interval H&P Note (Signed)
Cath Lab Visit (complete for each Cath Lab visit)  Clinical Evaluation Leading to the Procedure:   ACS: Yes.    Non-ACS:    Anginal Classification: CCS III  Anti-ischemic medical therapy: Maximal Therapy (2 or more classes of medications)  Non-Invasive Test Results: No non-invasive testing performed  Prior CABG: Previous CABG      History and Physical Interval Note:  04/07/2017 4:43 PM  Brandi Arellano  has presented today for surgery, with the diagnosis of cad  The various methods of treatment have been discussed with the patient and family. After consideration of risks, benefits and other options for treatment, the patient has consented to  Procedure(s): CORONARY STENT INTERVENTION (N/A) as a surgical intervention .  The patient's history has been reviewed, patient examined, no change in status, stable for surgery.  I have reviewed the patient's chart and labs.  Questions were answered to the patient's satisfaction.     Shelva Majestic

## 2017-04-08 ENCOUNTER — Telehealth: Payer: Self-pay | Admitting: Cardiology

## 2017-04-08 ENCOUNTER — Encounter (HOSPITAL_COMMUNITY): Payer: Self-pay | Admitting: Cardiovascular Disease

## 2017-04-08 DIAGNOSIS — R748 Abnormal levels of other serum enzymes: Secondary | ICD-10-CM

## 2017-04-08 LAB — RENAL FUNCTION PANEL
Albumin: 2.8 g/dL — ABNORMAL LOW (ref 3.5–5.0)
Anion gap: 14 (ref 5–15)
BUN: 41 mg/dL — AB (ref 6–20)
CHLORIDE: 93 mmol/L — AB (ref 101–111)
CO2: 22 mmol/L (ref 22–32)
CREATININE: 6.46 mg/dL — AB (ref 0.44–1.00)
Calcium: 8.1 mg/dL — ABNORMAL LOW (ref 8.9–10.3)
GFR calc Af Amer: 7 mL/min — ABNORMAL LOW (ref >60)
GFR, EST NON AFRICAN AMERICAN: 6 mL/min — AB (ref >60)
GLUCOSE: 180 mg/dL — AB (ref 65–99)
POTASSIUM: 3.6 mmol/L (ref 3.5–5.1)
Phosphorus: 6.4 mg/dL — ABNORMAL HIGH (ref 2.5–4.6)
Sodium: 129 mmol/L — ABNORMAL LOW (ref 135–145)

## 2017-04-08 LAB — GLUCOSE, CAPILLARY
GLUCOSE-CAPILLARY: 239 mg/dL — AB (ref 65–99)
Glucose-Capillary: 151 mg/dL — ABNORMAL HIGH (ref 65–99)
Glucose-Capillary: 74 mg/dL (ref 65–99)
Glucose-Capillary: 250 mg/dL — ABNORMAL HIGH (ref 65–99)

## 2017-04-08 LAB — CBC
HEMATOCRIT: 25.3 % — AB (ref 36.0–46.0)
Hemoglobin: 8.1 g/dL — ABNORMAL LOW (ref 12.0–15.0)
MCH: 30 pg (ref 26.0–34.0)
MCHC: 32 g/dL (ref 30.0–36.0)
MCV: 93.7 fL (ref 78.0–100.0)
PLATELETS: 146 10*3/uL — AB (ref 150–400)
RBC: 2.7 MIL/uL — ABNORMAL LOW (ref 3.87–5.11)
RDW: 17.7 % — AB (ref 11.5–15.5)
WBC: 6.6 10*3/uL (ref 4.0–10.5)

## 2017-04-08 MED ORDER — DARBEPOETIN ALFA 100 MCG/0.5ML IJ SOSY
PREFILLED_SYRINGE | INTRAMUSCULAR | Status: AC
  Filled 2017-04-08: qty 0.5

## 2017-04-08 MED ORDER — SODIUM CHLORIDE 0.9 % IV SOLN: 100.0000 mL | INTRAVENOUS | Status: DC | PRN

## 2017-04-08 MED ORDER — HEPARIN SODIUM (PORCINE) 1000 UNIT/ML DIALYSIS
1000.0000 [IU] | INTRAMUSCULAR | Status: DC | PRN
  Filled 2017-04-08: qty 1

## 2017-04-08 MED ORDER — LIDOCAINE HCL (PF) 1 % IJ SOLN: 5.0000 mL | INTRAMUSCULAR | Status: DC | PRN

## 2017-04-08 MED ORDER — LIDOCAINE-PRILOCAINE 2.5-2.5 % EX CREA: 1.0000 "application " | TOPICAL_CREAM | CUTANEOUS | Status: DC | PRN

## 2017-04-08 MED ORDER — ALTEPLASE 2 MG IJ SOLR: 2.0000 mg | Freq: Once | INTRAMUSCULAR | Status: DC | PRN

## 2017-04-08 MED ORDER — POLYETHYLENE GLYCOL 3350 17 G PO PACK
17.0000 g | PACK | Freq: Once | ORAL | Status: DC
  Filled 2017-04-08: qty 1

## 2017-04-08 MED ORDER — PENTAFLUOROPROP-TETRAFLUOROETH EX AERO: 1.0000 "application " | INHALATION_SPRAY | CUTANEOUS | Status: DC | PRN

## 2017-04-08 MED ORDER — DOXERCALCIFEROL 4 MCG/2ML IV SOLN
INTRAVENOUS | Status: AC
  Filled 2017-04-08: qty 2

## 2017-04-08 NOTE — Progress Notes (Addendum)
CARDIAC REHAB PHASE I   Discussed with pt MI, stent, Plavix, NTG, and restrictions. She will f/u with RD and HD for diet. She is unable to ambulate and also has HD MWF, she is not appropriate for CRPII. Pt voiced understanding but will need reiteration with her family. Raymond, ACSM 04/08/2017 1:26 PM

## 2017-04-08 NOTE — Progress Notes (Signed)
Progress Note  Patient Name: Brandi Arellano Date of Encounter: 04/08/2017  Primary Cardiologist: Fransico Him, MD   Subjective   Feeling well. No chest pain, sob or palpitations.   Inpatient Medications    Scheduled Meds: . Darbepoetin Alfa      . doxercalciferol      . aspirin  81 mg Oral Daily  . atorvastatin  80 mg Oral q1800  . citalopram  20 mg Oral Daily  . clopidogrel  75 mg Oral Q breakfast  . darbepoetin (ARANESP) injection - DIALYSIS  100 mcg Intravenous Q Fri-HD  . docusate sodium  100 mg Oral BID  . doxercalciferol  2 mcg Intravenous Q M,W,F-HD  . lanthanum  1,000 mg Oral TID WC  . levothyroxine  25 mcg Oral QAC breakfast  . mouth rinse  15 mL Mouth Rinse BID  . metoCLOPramide (REGLAN) injection  5 mg Intravenous Q12H  . metoprolol tartrate  25 mg Oral BID  . senna-docusate  1 tablet Oral QHS  . thiamine  100 mg Oral Daily   Continuous Infusions: . sodium chloride    . sodium chloride    . cefTRIAXone (ROCEPHIN)  IV Stopped (04/07/17 2019)   PRN Meds: sodium chloride, sodium chloride, acetaminophen, alteplase, heparin, lanthanum, lidocaine (PF), lidocaine-prilocaine, nitroGLYCERIN, ondansetron **OR** ondansetron (ZOFRAN) IV, oxyCODONE, pentafluoroprop-tetrafluoroeth   Vital Signs    Vitals:   04/08/17 0700 04/08/17 0810 04/08/17 0815 04/08/17 0830  BP: (!) 141/64 110/60 113/63 (!) 137/59  Pulse: (!) 58 (!) 56 (!) 56 (!) 56  Resp: 18     Temp: 98.3 F (36.8 C)  98.1 F (36.7 C)   TempSrc: Oral  Oral   SpO2: 92%  95%   Weight:   126 lb 12.2 oz (57.5 kg)   Height:        Intake/Output Summary (Last 24 hours) at 04/08/2017 0910 Last data filed at 04/08/2017 0700 Gross per 24 hour  Intake 136.5 ml  Output 0 ml  Net 136.5 ml   Filed Weights   04/07/17 0442 04/08/17 0300 04/08/17 0815  Weight: 118 lb 6.2 oz (53.7 kg) 126 lb 12.2 oz (57.5 kg) 126 lb 12.2 oz (57.5 kg)    Telemetry    SR - Personally Reviewed  ECG    SR with ST depression  with inferior lateral leads with LVH - Personally Reviewed  Physical Exam   GEN: No acute distress.   Neck: No JVD Cardiac: RRR, no murmurs, rubs, or gallops. R radial and groin site without hematoma Respiratory: Clear to auscultation bilaterally. GI: Soft, nontender, non-distended  MS: No edema; Left AKA Neuro:  Nonfocal  Psych: Normal affect   Labs    Chemistry Recent Labs  Lab 04/04/17 1021  04/06/17 1400 04/07/17 0356 04/08/17 0319  NA 140   < > 133* 132* 129*  K 4.1   < > 3.7 3.4* 3.6  CL 99*   < > 98* 95* 93*  CO2 27   < > 23 25 22   GLUCOSE 281*   < > 102* 114* 180*  BUN 33*   < > 54* 23* 41*  CREATININE 5.10*   < > 6.56* 4.18* 6.46*  CALCIUM 9.7   < > 8.3* 8.3* 8.1*  PROT 8.2*  --   --   --   --   ALBUMIN 3.5   < > 2.9* 2.8* 2.8*  AST 42*  --   --   --   --   ALT 11*  --   --   --   --  ALKPHOS 75  --   --   --   --   BILITOT 0.6  --   --   --   --   GFRNONAA 8*   < > 6* 11* 6*  GFRAA 10*   < > 7* 12* 7*  ANIONGAP 14   < > 12 12 14    < > = values in this interval not displayed.     Hematology Recent Labs  Lab 04/06/17 1400 04/07/17 0356 04/08/17 0319  WBC 6.7 8.0 6.6  RBC 3.01* 2.94* 2.70*  HGB 9.0* 8.7* 8.1*  HCT 28.5* 27.6* 25.3*  MCV 94.7 93.9 93.7  MCH 29.9 29.6 30.0  MCHC 31.6 31.5 32.0  RDW 17.7* 17.3* 17.7*  PLT 167 157 146*    Cardiac Enzymes Recent Labs  Lab 04/04/17 1910 04/05/17 0026 04/05/17 1048 04/07/17 2125  TROPONINI >65.00* >65.00* 60.15* 20.08*    Recent Labs  Lab 04/04/17 1033 04/04/17 1400  TROPIPOC 8.52* >30.00*       Radiology    No results found.  Cardiac Studies   CORONARY STENT INTERVENTION  04/07/17  Conclusion     Ost LAD to Prox LAD lesion is 80% stenosed.  Prox LAD lesion is 90% stenosed.  Mid LAD-1 lesion is 90% stenosed.  Mid LAD-2 lesion is 95% stenosed.  Ost 1st Diag lesion is 50% stenosed.  Prox Cx to Mid Cx lesion is 100% stenosed with 100% stenosed side branch in Ost 2nd  Mrg.  Ost RCA to Prox RCA lesion is 100% stenosed.  Mid RCA to Dist RCA lesion is 100% stenosed with 100% stenosed side branch in Acute Mrg.  Origin lesion is 80% stenosed.  Post intervention, there is a 0% residual stenosis.  A stent was successfully placed.   Successful percutaneous coronary intervention to a calcified 80-85% ostial LIMA graft stenosis treated with a 2.010 mm and 2.510 mm Wilverine cutting balloon for predilatation, and DES stenting with a 3.012 mm Synergy stent postdilated to 3.26 mm with the stenosis being reduced to 0%  RECOMMENDATION: The patient will continue with DAPT.  Medical therapy for significant concomitant CAD.  If she continues to experience symptomatology, consider atherectomy of the proximal LAD prior to a large first diagonal takeoff.     LEFT HEART CATH AND CORONARY ANGIOGRAPHY 04/06/17  Conclusion     Ost RCA to Prox RCA lesion is 100% stenosed.  Mid RCA to Dist RCA lesion is 100% stenosed with 100% stenosed side branch in Acute Mrg.  Prox Cx to Mid Cx lesion is 100% stenosed with 100% stenosed side branch in Ost 2nd Mrg.  Ost LAD to Prox LAD lesion is 80% stenosed.  Prox LAD lesion is 90% stenosed.  Mid LAD-1 lesion is 90% stenosed.  Mid LAD-2 lesion is 95% stenosed.  Origin lesion is 80% stenosed.  Ost 1st Diag lesion is 50% stenosed.  Severe native CAD with calcification and evidence for probable 80% calcified stenoses in the very proximal LAD prior to a moderate size bifurcating diagonal vessel with 50% narrowing in the diagonal vessel and 80% in the distal superior branch of this vessel, diffuse 80-90% and 95% multiple stenoses in a severely calcified mid LAD prior to mid-distal diagonal vessel with the LIMA graft inserting beyond this diagonal vessel; total occlusion of the right proximal circumflex after a diminutive first marginal branch, and total occlusion of the RCA at its ostium.  Subclavian stent with mild luminal  irregularity.  80% eccentric ostial stenosis in the  LIMA which supplies the distal LAD after mid-distal diagonal vessel.  Patent vein graft supplying the distal marginal vessel of the circumflex coronary artery.  Patent vein graft supplying the distal RCA. There are septal collaterals from the distal RCA towards the LAD.   RECOMMENDATION: Angiographic findings were reviewed in detail with both Drs. Skains and [employer]. It appears that the ostial stenosis of the LIMA graft is tighter than previously thought and is at least 80%. There is also concern that the very proximal LAD which is calcified and at least 80% stenoses jeopardizing a large diagonal vessel, which is not supplied by the LIMA graft. The patient will undergo dialysis later today. Discussion was made concerning possible stage intervention to the LIMA ostium and staged atherectomy to the very proximal LAD before the diagonal vessel with medical therapy for the diffuse stenoses beyond the diagonal vessel in the mid LAD.   Echo 04/05/17 Study Conclusions  - Left ventricle: The cavity size was normal. Wall thickness was increased in a pattern of moderate LVH. Systolic function was mildly to moderately reduced. The estimated ejection fraction was in the range of 40% to 45%. Diffuse hypokinesis. Features are consistent with a pseudonormal left ventricular filling pattern, with concomitant abnormal relaxation and increased filling pressure (grade 2 diastolic dysfunction). - Aortic valve: Trileaflet; severely thickened, severely calcified leaflets. Valve mobility was restricted. There was mild to moderate stenosis. There was trivial regurgitation. Peak velocity (S): 274 cm/s. Mean gradient (S): 17 mm Hg. - Mitral valve: Severely calcified annulus. Mildly thickened leaflets . - Left atrium: The atrium was mildly dilated. Volume/bsa, S: 38.7 ml/m^2.  Impressions:  - EF is reduced when compared to  prior (45-50%)   Patient Profile    61 y.o.femalewith coronary artery disease status post CABG with cardiac catheterization performed last month showing 50% ostial stenosis of LIMA to LAD with patent SVG to OM, SVG to RCA but both graft supply small diffusely diseased vessels with mild to moderate aortic stenosis as well. Her native vessels are heavily calcified and diffusely diseased. Currently having non-ST elevation myocardial infarction with symptoms of nausea and vomiting. Denying any active chest pain.  Assessment & Plan    1. NSTEMI - Troponin >65. Now trended down. Cath showed ostial LIMA 80% stenosis with subclavian stent proximal to this and tight diagonal lesions. Detailed report as above. Initial plan for PCI of ostial LIMA and staged PCI of diagonal with Rotablator next week.  - She underwent successful PTCA and DES to ostial LIMA graft 04/07/17 by Dr. Claiborne Billings. His Recommendations >>>If she continues to experience symptomatology, consider atherectomy of the proximal LAD prior to a large first diagonal takeoff (initial plan was to do PCI with Rotablator next week). - The case was discussed with Dr. Irish Lack today who also reccommended medical management for residual disease -  Continue ASA, Plavix, statin  and BB.   2. Mild to moderate AS - Follow with yearly echo  3. ESRD on HD - Per nephrology  4. Invasive Bladder cancer - Seen by Dr.  Tresa Moore yesterday. GU resection likely 07/2017.  5. HLD - Continue statin. LFT and lipid panel in 6 weeks.   For questions or updates, please contact [facility] Please consult [email or web address] for contact info under Cardiology/STEMI.      Jarrett Soho, PA  04/08/2017, 9:10 AM    Patient examined chart reviewed. Exam on dialysis with catheter right subclavian clear lungs SEM and RFA cath ]sight with no hematoma. Discussed with Dr  Joanna Hews and [place]. They favor medical Rx of native LAD And proceed with  bladder surgery in 8-10 weeks. Ok to d/c home and will arrange cardiology f/u with Dr Onnie Boer

## 2017-04-08 NOTE — Progress Notes (Signed)
[facility] Progress Note   Subjective:  No new c/o's, not much appetite, no abd pain.   Objective Vitals:   04/08/17 1100 04/08/17 1130 04/08/17 1145 04/08/17 1306  BP: 130/70 110/70 130/73 (!) 124/44  Pulse: 62 66 70 71  Resp: 18 18 18 16   Temp:   97.6 F (36.4 C) (!) 97.4 F (36.3 C)  TempSrc:   Oral Oral  SpO2:  99%  96%  Weight:  57.5 kg (126 lb 12.2 oz)    Height:       Physical Exam General: Sleepy appearing female. NAD. On nasal oxygen. Heart: RRR; 4/6 systolic murmur Lungs: CTA anteriorly Abdomen: soft, non-tender. Foley in place. 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) Dialysis Access: TDC in R chest  Additional Objective Labs: Basic Metabolic Panel: Recent Labs  Lab 04/06/17 1400 04/07/17 0356 04/08/17 0319  NA 133* 132* 129*  K 3.7 3.4* 3.6  CL 98* 95* 93*  CO2 23 25 22   GLUCOSE 102* 114* 180*  BUN 54* 23* 41*  CREATININE 6.56* 4.18* 6.46*  CALCIUM 8.3* 8.3* 8.1*  PHOS 4.8* 4.0 6.4*   Liver Function Tests: Recent Labs  Lab 04/04/17 1021  04/06/17 1400 04/07/17 0356 04/08/17 0319  AST 42*  --   --   --   --   ALT 11*  --   --   --   --   ALKPHOS 75  --   --   --   --   BILITOT 0.6  --   --   --   --   PROT 8.2*  --   --   --   --   ALBUMIN 3.5   < > 2.9* 2.8* 2.8*   < > = values in this interval not displayed.   Recent Labs  Lab 04/05/17 1812  LIPASE 56*   CBC: Recent Labs  Lab 04/04/17 1430 04/05/17 0648 04/06/17 1400 04/07/17 0356 04/08/17 0319  WBC 11.4* 13.1* 6.7 8.0 6.6  HGB 9.9* 10.6* 9.0* 8.7* 8.1*  HCT 31.8* 34.3* 28.5* 27.6* 25.3*  MCV 96.1 95.5 94.7 93.9 93.7  PLT 187 199 167 157 146*   Cardiac Enzymes: Recent Labs  Lab 04/04/17 1910 04/05/17 0026 04/05/17 1048 04/05/17 1346 04/07/17 2125  CKTOTAL  --   --   --  532*  --   CKMB  --   --   --  44.5*  --   TROPONINI >65.00* >65.00* 60.15*  --  20.08*   CBG: Recent Labs  Lab 04/07/17 1405 04/07/17 1615 04/07/17 1955  04/08/17 0626 04/08/17 1253  GLUCAP 117* 93 79 151* 74   Studies/Results: No results found. Medications: . sodium chloride    . sodium chloride    . cefTRIAXone (ROCEPHIN)  IV Stopped (04/07/17 2019)   . aspirin  81 mg Oral Daily  . atorvastatin  80 mg Oral q1800  . citalopram  20 mg Oral Daily  . clopidogrel  75 mg Oral Q breakfast  . darbepoetin (ARANESP) injection - DIALYSIS  100 mcg Intravenous Q Fri-HD  . docusate sodium  100 mg Oral BID  . doxercalciferol  2 mcg Intravenous Q M,W,F-HD  . lanthanum  1,000 mg Oral TID WC  . levothyroxine  25 mcg Oral QAC breakfast  . mouth rinse  15 mL Mouth Rinse BID  . metoCLOPramide (REGLAN) injection  5 mg Intravenous Q12H  . metoprolol tartrate  25 mg Oral BID  . senna-docusate  1 tablet  Oral QHS  . thiamine  100 mg Oral Daily    Dialysis Orders: [facility] MWF 4h 56kg Hep 4000 R IJ cath BFR400/Auto 1.5, Linear sodium UFP 4 - Hectorol 2 mcg IV TIW (no recent PTH) - Mircera 150 mcg IV q 2 weeks (last dose 50 mcg IV 03/23/17 Last HGB 8.6 04/09/17) - home BP Rx > norvasc 10 qd/ metop 25 bid  Impression: 1. Acute NSTEMI/ prior CABG/ trop > 60 - s/p LHC 12/26 w/ stenotic LIMA graft (PCI today) and native diagonal (rotablator next week).  2. ESRD: cont HD MWF.  HD today.  3. HTN/volume: BP's soft, under dry wt, no vol on exam, on MTP only.  4. 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 Was scheduled for cystectomy and B nephrectomy in mid-Jan, now pushed back until 3 mos after PCI, see #1.  Per ONC, chemoRx not helpful w/ this cancer.  Surgical plan intent is curative. 6. CAD (Hx CABG): See above.  Plan:  HD today, keep even.     [name] MD [employer] pager [phone]   04/08/2017, 1:39 PM

## 2017-04-08 NOTE — Telephone Encounter (Signed)
TOC Brandi Arellano-Please call Brandi Arellano-Pt has an appointment with Ellen Henri on 04-18-17.

## 2017-04-08 NOTE — Progress Notes (Signed)
Family Medicine Teaching Service Daily Progress Note Intern Pager: [phone]  Patient name: Brandi Arellano Medical record number: [id number] Date of birth: Aug 20, 1955 Age: 61 y.o. Gender: female  Primary Care Provider: Kerin Perna, NP Consultants: nephrology, cardiology, urology  Code Status: partial DNI   [name]:  Admitted to [facility] on 04/04/16 for nausea/vomiting and hypoxia   Assessment and Plan: Brandi Arellano a 61 y.o.femalepresenting with nausea and vomiting, found to be hypoxic with bilateral pleural effusions. PMH is significant forESRD, HTN, T2DM, invasive bladder cancer  Acute Hypoxia-resolved Sats at 90% on RA. No history of oxygen requirement.CXR showingmild vascular congestion and small right effusion. Mild leukocytosis on admission that is now resolved, patient is afebrile, no signs of pneumonia on CXR. Hypoxia may be secondary to acute coronary event. Patient did emphasize on admission she is DNI. -continuous pulse ox and cardiac monitoring -supplemental O2 as needed to keep sats > 90%  NSTEMI  Troponin 8.52> 30>(>65)>(>65)>60>20.Hx of CAD. EKG with LBBB. Cardiology was consulted and started patient on heparin drip. EKG similar to previous. Total CK 532. Cardiac cath showing ostial LIMA 80% stenosis with subclavian stent proximal with tight diagonal lesions. Patient had PCI of ostial LIMA on 12/27 -Cardiology consulted, appreciate recommendations: Plan for staged PCI of diagonal with rotablatero next week. Recommend continuing IV heparin, ASA, plavix, and BB  -Continue Metoprolol and Lipitor  -prn nitro ordered  Mild to moderate AS Echo showing mild to moderate aortic stenosis. Recommended by cardiology for yearly echo -appreciate cardiology recommendations   Pyuria Suprapubic tenderness. Will treat for UTI  -Urine culture pending, reincubated for better growth  -Ceftriaxone 1 gram daily   Nausea and  vomiting Resolved. KUB with a large amount of stool in the rectal vault may represent fecal impaction. No evidence of obstruction.Also with known invasive bladder cancer that may be causing pain. -Will need an enema in the future given concern for fecal impaction - will hold off as concern for vasovagal response given patient's tentative cardiac status, NO FLEET -Will give metoclopramide 5 mg BID, dose adjusted for kidney function  -Zofran as needed for nausea  -oxy IR 5 mg every 3 hour prn   ESRD, MWF HD Last HD on 12/27 -nephrology following, appreciate recommendations  -daily renal function panels  Invasive bladder cancer chronic indwelling Foley in place -patient was to follow up w/ urology in January -seen by urology this am, note is pending   HTN Hypotensive overnight but currently BP 141/64 -Will hold amlodipine for now  -continuemetop -fluid bolus as needed   T2DM last A1C 6.8 02/07/17. Not on any home meds. Glucose12/26 140s-150s -sensitiveSSI -CBGs AC/HS  Hypothyroidism stable, on synthroid -continue home synthroid -check TSH  HLD stable, on lipitor -continue home medication  Depression Patient reported by nursing staff to be depressed and have decreased appetite due to depression. -will start celexa 20mg  daily   FEN/GI:renal/carb modified Prophylaxis:heparin  Disposition: continued inpatient stay   Subjective:  Patient today with no complaints, laying in HD bed. Denies n/v/d. States she had a BM yesterday and states it was "a good bit". Denies CP or SOB. States some pain from PCI yesterday but otherwise feeling well.   Objective: Temp:  [98 F (36.7 C)-98.3 F (36.8 C)] 98.1 F (36.7 C) (12/28 0815) Pulse Rate:  [56-75] 61 (12/28 0930) Resp:  [4-25] 18 (12/28 0700) BP: (78-165)/(44-91) 120/62 (12/28 0930) SpO2:  [0 %-100 %] 95 % (12/28 0815) Weight:  [126 lb 12.2  oz (57.5 kg)] 126 lb 12.2 oz (57.5 kg) (12/28 0815) Physical  Exam: General: awake and alert, laying in HD bed, NAD  Cardiovascular: 3/6 systolic murmur, no MRG Respiratory: CTAB, no wheezes, rales or rhonchi  Abdomen: soft, non tender, non distended, bowel sounds normal  Extremities: non tender   Laboratory: Recent Labs  Lab 04/06/17 1400 04/07/17 0356 04/08/17 0319  WBC 6.7 8.0 6.6  HGB 9.0* 8.7* 8.1*  HCT 28.5* 27.6* 25.3*  PLT 167 157 146*   Recent Labs  Lab 04/04/17 1021  04/06/17 1400 04/07/17 0356 04/08/17 0319  NA 140   < > 133* 132* 129*  K 4.1   < > 3.7 3.4* 3.6  CL 99*   < > 98* 95* 93*  CO2 27   < > 23 25 22   BUN 33*   < > 54* 23* 41*  CREATININE 5.10*   < > 6.56* 4.18* 6.46*  CALCIUM 9.7   < > 8.3* 8.3* 8.1*  PROT 8.2*  --   --   --   --   BILITOT 0.6  --   --   --   --   ALKPHOS 75  --   --   --   --   ALT 11*  --   --   --   --   AST 42*  --   --   --   --   GLUCOSE 281*   < > 102* 114* 180*   < > = values in this interval not displayed.     Ref. Range 04/04/2017 19:10 04/05/2017 00:26 04/05/2017 10:48 04/07/2017 21:25  Troponin I Latest Ref Range: <0.03 ng/mL >65.00 (HH) >65.00 (HH) 60.15 (HH) 20.08 (HH)    Imaging/Diagnostic Tests: Dg Chest 2 View  Result Date: 04/05/2017 CLINICAL DATA:  Abdominal pain with nausea and vomiting 3 days. Dry cough. EXAM: CHEST  2 VIEW COMPARISON:  04/04/2017 FINDINGS: Sternotomy wires and right IJ dialysis catheter unchanged. Lungs are adequately inflated with minimal prominence of the perihilar markings suggesting mild vascular congestion. Small amount of posterior right pleural fluid. Cardiomediastinal silhouette and remainder of the exam is unchanged. IMPRESSION: Findings suggesting mild vascular congestion.  Small right effusion. Electronically Signed   By: Marin Olp M.D.   On: 04/05/2017 10:18   Abd 1 View (kub)  Result Date: 04/04/2017 CLINICAL DATA:  61 year old female with abdominal pain, nausea vomiting. EXAM: ABDOMEN - 1 VIEW COMPARISON:  CT of the abdomen pelvis  dated 02/06/2017 FINDINGS: There is no bowel dilatation or evidence of obstruction. No free air identified on the provided images. There is extensive atherosclerotic calcification of the aorta and mesenteric vasculature. Large amount of stool noted in the rectal vault. The osseous structures and soft tissues appear unremarkable. IMPRESSION: Large amount of stool in the rectal vault may represent fecal impaction. No evidence of obstruction. Electronically Signed   By: Anner Crete M.D.   On: 04/04/2017 21:49   Dg Chest Portable 1 View  Result Date: 04/04/2017 CLINICAL DATA:  Hypoxia. EXAM: PORTABLE CHEST 1 VIEW COMPARISON:  Radiograph of February 07, 2017. FINDINGS: Stable cardiomegaly with central pulmonary vascular congestion. Atherosclerosis of thoracic aorta is noted. Bilateral pulmonary edema is noted with right worse than left. No pneumothorax or pleural effusion is noted. Right internal jugular dialysis catheter is unchanged in position. Sternotomy wires are noted. Bony thorax is unremarkable. IMPRESSION: Bilateral pulmonary edema is noted, right worse than left. Aortic atherosclerosis. Electronically Signed   By: Sabino Dick  Brooke Bonito, M.D.   On: 04/04/2017 10:48     Caroline More, DO 04/08/2017, 9:48 AM PGY-1, [place] Intern pager: [phone], text pages welcome

## 2017-04-09 LAB — GLUCOSE, CAPILLARY
GLUCOSE-CAPILLARY: 150 mg/dL — AB (ref 65–99)
GLUCOSE-CAPILLARY: 262 mg/dL — AB (ref 65–99)
Glucose-Capillary: 142 mg/dL — ABNORMAL HIGH (ref 65–99)
Glucose-Capillary: 61 mg/dL — ABNORMAL LOW (ref 65–99)
Glucose-Capillary: 98 mg/dL (ref 65–99)

## 2017-04-09 LAB — RENAL FUNCTION PANEL
ALBUMIN: 2.8 g/dL — AB (ref 3.5–5.0)
ANION GAP: 11 (ref 5–15)
BUN: 23 mg/dL — ABNORMAL HIGH (ref 6–20)
CALCIUM: 8.3 mg/dL — AB (ref 8.9–10.3)
CO2: 23 mmol/L (ref 22–32)
CREATININE: 4.28 mg/dL — AB (ref 0.44–1.00)
Chloride: 96 mmol/L — ABNORMAL LOW (ref 101–111)
GFR calc non Af Amer: 10 mL/min — ABNORMAL LOW (ref >60)
GFR, EST AFRICAN AMERICAN: 12 mL/min — AB (ref >60)
Glucose, Bld: 173 mg/dL — ABNORMAL HIGH (ref 65–99)
PHOSPHORUS: 3.5 mg/dL (ref 2.5–4.6)
Potassium: 3.8 mmol/L (ref 3.5–5.1)
SODIUM: 130 mmol/L — AB (ref 135–145)

## 2017-04-09 LAB — CBC
HCT: 23.8 % — ABNORMAL LOW (ref 36.0–46.0)
HEMOGLOBIN: 7.7 g/dL — AB (ref 12.0–15.0)
MCH: 30.3 pg (ref 26.0–34.0)
MCHC: 32.4 g/dL (ref 30.0–36.0)
MCV: 93.7 fL (ref 78.0–100.0)
PLATELETS: 135 10*3/uL — AB (ref 150–400)
RBC: 2.54 MIL/uL — AB (ref 3.87–5.11)
RDW: 17.5 % — ABNORMAL HIGH (ref 11.5–15.5)
WBC: 5 10*3/uL (ref 4.0–10.5)

## 2017-04-09 MED ORDER — AMOXICILLIN 500 MG PO CAPS
500.0000 mg | ORAL_CAPSULE | ORAL | Status: DC
  Administered 2017-04-10: 500 mg via ORAL
  Filled 2017-04-09 (×2): qty 1

## 2017-04-09 MED ORDER — AMOXICILLIN 500 MG PO CAPS: 500.0000 mg | ORAL_CAPSULE | Freq: Three times a day (TID) | ORAL | Status: DC

## 2017-04-09 MED ORDER — INSULIN ASPART 100 UNIT/ML ~~LOC~~ SOLN
0.0000 [IU] | Freq: Three times a day (TID) | SUBCUTANEOUS | Status: DC
  Administered 2017-04-09: 8 [IU] via SUBCUTANEOUS

## 2017-04-09 NOTE — Progress Notes (Signed)
Had visit and prayer with the patient.  She was upset over a dialysis patient exposing sickness to other patients like her when they went to dialysis.  Patient would not wear mask and others got sick. She was upset over it and wanted that patient to be more considerate.  Conard Novak, Chaplain   04/09/17 1800  Clinical Encounter Type  Visited With Patient  Visit Type Spiritual support  Spiritual Encounters  Spiritual Needs Prayer;Emotional  Stress Factors  Patient Stress Factors Other (Comment) (was upset over dialysis patient refusing to wear mask)

## 2017-04-09 NOTE — Progress Notes (Signed)
[facility] Progress Note   Subjective:  Denies chest pain/SOB. No abdominal pain. Says she could have gone home yesterday but delayed because of weather (?).   Objective Vitals:   04/08/17 2006 04/08/17 2209 04/09/17 0351 04/09/17 0803  BP: (!) 84/46 (!) 148/63 (!) 150/61 (!) 140/56  Pulse: (!) 57 61 65 63  Resp: 16 12 19    Temp: 98.5 F (36.9 C)  98.1 F (36.7 C) 98.2 F (36.8 C)  TempSrc: Oral  Oral Oral  SpO2: 93% 93% 96% 95%  Weight:   54.2 kg (119 lb 7.8 oz)   Height:       Physical Exam General: Pleasant, NAD Heart: G2,I9, RRR 3/6 systolic M. No JVD. SR on monitor. Still with ST depression visible on monitor.  Lungs: CTAB A/P Abdomen: Active BS foley patent.  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 No LLE edema Dialysis Access: RIJ TDC drsg CDI.   Additional Objective Labs: Basic Metabolic Panel: Recent Labs  Lab 04/07/17 0356 04/08/17 0319 04/09/17 0328  NA 132* 129* 130*  K 3.4* 3.6 3.8  CL 95* 93* 96*  CO2 25 22 23   GLUCOSE 114* 180* 173*  BUN 23* 41* 23*  CREATININE 4.18* 6.46* 4.28*  CALCIUM 8.3* 8.1* 8.3*  PHOS 4.0 6.4* 3.5   Liver Function Tests: Recent Labs  Lab 04/04/17 1021  04/07/17 0356 04/08/17 0319 04/09/17 0328  AST 42*  --   --   --   --   ALT 11*  --   --   --   --   ALKPHOS 75  --   --   --   --   BILITOT 0.6  --   --   --   --   PROT 8.2*  --   --   --   --   ALBUMIN 3.5   < > 2.8* 2.8* 2.8*   < > = values in this interval not displayed.   Recent Labs  Lab 04/05/17 1812  LIPASE 56*   CBC: Recent Labs  Lab 04/05/17 0648 04/06/17 1400 04/07/17 0356 04/08/17 0319 04/09/17 0328  WBC 13.1* 6.7 8.0 6.6 5.0  HGB 10.6* 9.0* 8.7* 8.1* 7.7*  HCT 34.3* 28.5* 27.6* 25.3* 23.8*  MCV 95.5 94.7 93.9 93.7 93.7  PLT 199 167 157 146* 135*   Blood Culture    Component Value Date/Time   SDES URINE, CATHETERIZED 04/06/2017 0628   SPECREQUEST NONE 04/06/2017 0628   CULT (A) 04/06/2017 0628    80,000 COLONIES/mL  ENTEROCOCCUS FAECALIS REPEATING SENSITIVITIES    REPTSTATUS PENDING 04/06/2017 0628    Cardiac Enzymes: Recent Labs  Lab 04/04/17 1910 04/05/17 0026 04/05/17 1048 04/05/17 1346 04/07/17 2125  CKTOTAL  --   --   --  532*  --   CKMB  --   --   --  44.5*  --   TROPONINI >65.00* >65.00* 60.15*  --  20.08*   CBG: Recent Labs  Lab 04/08/17 0626 04/08/17 1253 04/08/17 1641 04/08/17 2121 04/09/17 0617  GLUCAP 151* 74 250* 239* 142*   Iron Studies: No results for input(s): IRON, TIBC, TRANSFERRIN, FERRITIN in the last 72 hours. @lablastinr3 @ Studies/Results: No results found. Medications: . sodium chloride    . sodium chloride    . cefTRIAXone (ROCEPHIN)  IV Stopped (04/08/17 1748)   . aspirin  81 mg Oral Daily  . atorvastatin  80 mg Oral q1800  . citalopram  20 mg Oral Daily  . clopidogrel  75  mg Oral Q breakfast  . darbepoetin (ARANESP) injection - DIALYSIS  100 mcg Intravenous Q Fri-HD  . docusate sodium  100 mg Oral BID  . doxercalciferol  2 mcg Intravenous Q M,W,F-HD  . lanthanum  1,000 mg Oral TID WC  . levothyroxine  25 mcg Oral QAC breakfast  . mouth rinse  15 mL Mouth Rinse BID  . metoCLOPramide (REGLAN) injection  5 mg Intravenous Q12H  . metoprolol tartrate  25 mg Oral BID  . polyethylene glycol  17 g Oral Once  . senna-docusate  1 tablet Oral QHS  . thiamine  100 mg Oral Daily    Dialysis Orders: [facility] MWF 4h 56kg Hep 4000 R IJ cath BFR400/Auto 1.5, Linear sodium UFP 4 - Hectorol 2 mcg IV TIW (no recent PTH) - Mircera 150 mcg IV q 2 weeks (last dose 50 mcg IV 03/23/17 Last HGB 8.6 04/09/17) - home BP Rx > norvasc 10 qd/ metop 25 bid  Impression: 1. Acute NSTEMI/ prior CABG/ trop > 60 - s/p LHC 12/26 w/ stenotic LIMA graft S/P PTCI to LIMA 04/08/17 per Dr. Claiborne Billings. Medical management for residual disease. OK from cardiology stand point to DC home.   2. ESRD: cont HD MWF.  Will have HD tomorrow on Holiday schedule. Info written down and given to pt.    3. HTN/volume: BP stable today. HD 12/28 Net UF 500 Post wt 57.5 kg. Looks euvolemic at this weight. Raise EDW to 57 kg on DC.  4. Anemia of CKD: HGB 7.7 today. Did receive Darbe 100 mcg IV with HD 04/08/17. Follow HGB carefully as OP.  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 Was scheduled for cystectomy and B nephrectomy in mid-Jan, now pushed back until 3 mos after PCI, see #1.  Per ONC, chemoRx not helpful w/ this cancer.  Surgical plan intent is curative. 6. CAD (Hx CABG): See above.  Disposition: OK from renal stand point for DC. HD tomorrow on holiday schedule. Pt aware.   Rita H. Brown NP-C 04/09/2017, 10:46 AM  [facility] [phone]  Pt seen, examined and agree w A/P as above.  [name] MD [employer] pager [phone]   04/09/2017, 11:52 AM

## 2017-04-09 NOTE — Progress Notes (Signed)
Family Medicine Teaching Service Daily Progress Note Intern Pager: [phone]  Patient name: Brandi Arellano Medical record number: [id number] Date of birth: [birth year] Age: 61 y.o. Gender: female  Primary Care Provider: Kerin Perna, NP Consultants: nephrology, cardiology, urology  Code Status: partial DNI   [name]:  Admitted to [facility] on 04/04/16 for nausea/vomiting and hypoxia   Assessment and Plan: Brandi Arellano a 61 y.o.femalepresenting with nausea and vomiting, found to be hypoxic with bilateral pleural effusions. PMH is significant forESRD, HTN, T2DM, invasive bladder cancer  Acute Hypoxia-resolved Sats at 96% on 1L. No history of oxygen requirement.CXR showingmild vascular congestion and small right effusion. Mild leukocytosis on admission that is now resolved, patient is afebrile, no signs of pneumonia on CXR. Hypoxia may be secondary to acute coronary event. Turned O2 all the way down this am. If continues to do well off oxygen can likely be discharged later today  -continuous pulse ox and cardiac monitoring -supplemental O2 as needed to keep sats > 90%  NSTEMI  Troponin 8.52> 30>(>65)>(>65)>60>20.Hx of CAD. EKG with LBBB. Cardiology was consulted and started patient on heparin drip. EKG similar to previous. Total CK 532. Cardiac cath showing ostial LIMA 80% stenosis with subclavian stent proximal with tight diagonal lesions. Patient had PCI of ostial LIMA on 12/27 -Cardiology consulted, appreciate recommendations: If continues to experience symptoms, atherectomy with rotoblater next week - for now, favor medical management with anticoag, asa, plavix, and bb - continue metoprolol and lipitor - prn nitro - ok for dc from cardiology standpoint per their notes  Mild to moderate AS Echo showing mild to moderate aortic stenosis. Recommended by cardiology for yearly echo -appreciate cardiology recommendations   Pyuria Patient tells  me that she has never had symptoms of a UTI, but past notes describe suprapubic tenderness. If truly asymptomatic likely does not need treatment. Given unreliable nature of patient will opt to treat. Will need sensitivities to switch to PO. -Urine culture, enterococcus faecalis, awaiting sensitivities -Ceftriaxone 1 gram daily (day 5)  Nausea and vomiting Resolved. KUB with a large amount of stool in the rectal vault may represent fecal impaction. No evidence of obstruction. - refused enema 12/28 - Continue give metoclopramide 5 mg BID, dose adjusted for kidney function  - Zofran as needed for nausea  -oxy IR 5 mg every 3 hour prn - miralax 17g daily - senna-docusate 1 tablet daily  ESRD, MWF HD Last HD on 12/28 -nephrology following, appreciate recommendations  -daily renal function panels  Invasive bladder cancer chronic indwelling Foley in place - urology to arrange outpatient follow up - likely curative intent surgery in April 2019  HTN Hypotensive overnight but currently BP 150/61 -Will hold amlodipine for now  -continuemetop -fluid bolus as needed   T2DM last A1C 6.8 02/07/17. Not on any home meds. Glucose12/28 74s-250s -sensitiveSSI -CBGs AC/HS  Hypothyroidism stable, on synthroid -continue home synthroid -check TSH  HLD stable, on lipitor -continue home medication  Depression Patient reported by nursing staff to be depressed and have decreased appetite due to depression. -will start celexa 20mg  daily   FEN/GI:renal/carb modified Prophylaxis:heparin  Disposition: likely home  Subjective:  No complaints this am. Says that she never felt any discomfort from her presumed UTI. Also states that she has no appetite. Continues to have large bowel movements.   Objective: Temp:  [97.4 F (36.3 C)-98.8 F (37.1 C)] 98.1 F (36.7 C) (12/29 0351) Pulse Rate:  [56-75] 65 (12/29 0351) Resp:  [12-21]  19 (12/29 0351) BP: (78-150)/(44-73) 150/61  (12/29 0351) SpO2:  [91 %-99 %] 96 % (12/29 0351) Weight:  [119 lb 7.8 oz (54.2 kg)-126 lb 12.2 oz (57.5 kg)] 119 lb 7.8 oz (54.2 kg) (12/29 0351) Physical Exam: General: awake and alert, NAD, pleasant Cardiovascular: 3/6 systolic murmur, no MRG, dialysis catheter in right chest Respiratory: CTAB, no wheezes, rales or rhonchi  Abdomen: soft, non tender, non distended, bowel sounds normal  Extremities: non tender, 5/5 strength in all remaining extremities, 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 GU: foley catheter in place  Laboratory: Recent Labs  Lab 04/07/17 0356 04/08/17 0319 04/09/17 0328  WBC 8.0 6.6 5.0  HGB 8.7* 8.1* 7.7*  HCT 27.6* 25.3* 23.8*  PLT 157 146* 135*   Recent Labs  Lab 04/04/17 1021  04/07/17 0356 04/08/17 0319 04/09/17 0328  NA 140   < > 132* 129* 130*  K 4.1   < > 3.4* 3.6 3.8  CL 99*   < > 95* 93* 96*  CO2 27   < > 25 22 23   BUN 33*   < > 23* 41* 23*  CREATININE 5.10*   < > 4.18* 6.46* 4.28*  CALCIUM 9.7   < > 8.3* 8.1* 8.3*  PROT 8.2*  --   --   --   --   BILITOT 0.6  --   --   --   --   ALKPHOS 75  --   --   --   --   ALT 11*  --   --   --   --   AST 42*  --   --   --   --   GLUCOSE 281*   < > 114* 180* 173*   < > = values in this interval not displayed.     Ref. Range 04/04/2017 19:10 04/05/2017 00:26 04/05/2017 10:48 04/07/2017 21:25  Troponin I Latest Ref Range: <0.03 ng/mL >65.00 (HH) >65.00 (HH) 60.15 (HH) 20.08 (HH)    Imaging/Diagnostic Tests: Dg Chest 2 View  Result Date: 04/05/2017 CLINICAL DATA:  Abdominal pain with nausea and vomiting 3 days. Dry cough. EXAM: CHEST  2 VIEW COMPARISON:  04/04/2017 FINDINGS: Sternotomy wires and right IJ dialysis catheter unchanged. Lungs are adequately inflated with minimal prominence of the perihilar markings suggesting mild vascular congestion. Small amount of posterior right pleural fluid. Cardiomediastinal silhouette and remainder of the exam is unchanged. IMPRESSION: Findings suggesting mild vascular  congestion.  Small right effusion. Electronically Signed   By: Marin Olp M.D.   On: 04/05/2017 10:18   Abd 1 View (kub)  Result Date: 04/04/2017 CLINICAL DATA:  61 year old female with abdominal pain, nausea vomiting. EXAM: ABDOMEN - 1 VIEW COMPARISON:  CT of the abdomen pelvis dated 02/06/2017 FINDINGS: There is no bowel dilatation or evidence of obstruction. No free air identified on the provided images. There is extensive atherosclerotic calcification of the aorta and mesenteric vasculature. Large amount of stool noted in the rectal vault. The osseous structures and soft tissues appear unremarkable. IMPRESSION: Large amount of stool in the rectal vault may represent fecal impaction. No evidence of obstruction. Electronically Signed   By: Anner Crete M.D.   On: 04/04/2017 21:49   Dg Chest Portable 1 View  Result Date: 04/04/2017 CLINICAL DATA:  Hypoxia. EXAM: PORTABLE CHEST 1 VIEW COMPARISON:  Radiograph of February 07, 2017. FINDINGS: Stable cardiomegaly with central pulmonary vascular congestion. Atherosclerosis of thoracic aorta is noted. Bilateral pulmonary edema is noted with right worse than left. No  pneumothorax or pleural effusion is noted. Right internal jugular dialysis catheter is unchanged in position. Sternotomy wires are noted. Bony thorax is unremarkable. IMPRESSION: Bilateral pulmonary edema is noted, right worse than left. Aortic atherosclerosis. Electronically Signed   By: Marijo Conception, M.D.   On: 04/04/2017 10:48     Guadalupe Dawn, MD 04/09/2017, 7:07 AM PGY-1, [place] Intern pager: [phone], text pages welcome

## 2017-04-10 LAB — CBC WITH DIFFERENTIAL/PLATELET
BASOS ABS: 0 10*3/uL (ref 0.0–0.1)
Basophils Relative: 0 %
EOS PCT: 8 %
Eosinophils Absolute: 0.5 10*3/uL (ref 0.0–0.7)
HEMATOCRIT: 24.2 % — AB (ref 36.0–46.0)
Hemoglobin: 7.6 g/dL — ABNORMAL LOW (ref 12.0–15.0)
LYMPHS ABS: 1 10*3/uL (ref 0.7–4.0)
LYMPHS PCT: 17 %
MCH: 29.7 pg (ref 26.0–34.0)
MCHC: 31.4 g/dL (ref 30.0–36.0)
MCV: 94.5 fL (ref 78.0–100.0)
MONO ABS: 0.4 10*3/uL (ref 0.1–1.0)
MONOS PCT: 7 %
NEUTROS ABS: 3.9 10*3/uL (ref 1.7–7.7)
Neutrophils Relative %: 68 %
PLATELETS: 138 10*3/uL — AB (ref 150–400)
RBC: 2.56 MIL/uL — ABNORMAL LOW (ref 3.87–5.11)
RDW: 17.6 % — AB (ref 11.5–15.5)
WBC: 5.7 10*3/uL (ref 4.0–10.5)

## 2017-04-10 LAB — URINE CULTURE: Culture: 80000 — AB

## 2017-04-10 LAB — RENAL FUNCTION PANEL
Albumin: 2.7 g/dL — ABNORMAL LOW (ref 3.5–5.0)
Anion gap: 9 (ref 5–15)
BUN: 34 mg/dL — ABNORMAL HIGH (ref 6–20)
CO2: 26 mmol/L (ref 22–32)
Calcium: 8.4 mg/dL — ABNORMAL LOW (ref 8.9–10.3)
Chloride: 95 mmol/L — ABNORMAL LOW (ref 101–111)
Creatinine, Ser: 6.23 mg/dL — ABNORMAL HIGH (ref 0.44–1.00)
GFR calc Af Amer: 8 mL/min — ABNORMAL LOW
GFR calc non Af Amer: 7 mL/min — ABNORMAL LOW
Glucose, Bld: 190 mg/dL — ABNORMAL HIGH (ref 65–99)
Phosphorus: 3.4 mg/dL (ref 2.5–4.6)
Potassium: 4 mmol/L (ref 3.5–5.1)
Sodium: 130 mmol/L — ABNORMAL LOW (ref 135–145)

## 2017-04-10 LAB — GLUCOSE, CAPILLARY
GLUCOSE-CAPILLARY: 166 mg/dL — AB (ref 65–99)
GLUCOSE-CAPILLARY: 148 mg/dL — AB (ref 65–99)
GLUCOSE-CAPILLARY: 237 mg/dL — AB (ref 65–99)
GLUCOSE-CAPILLARY: 139 mg/dL — AB (ref 65–99)
Glucose-Capillary: 213 mg/dL — ABNORMAL HIGH (ref 65–99)

## 2017-04-10 MED ORDER — INSULIN ASPART 100 UNIT/ML ~~LOC~~ SOLN
0.0000 [IU] | Freq: Three times a day (TID) | SUBCUTANEOUS | Status: DC
  Administered 2017-04-10: 3 [IU] via SUBCUTANEOUS

## 2017-04-10 NOTE — Progress Notes (Signed)
[facility] Progress Note   Subjective: Was supposed to be Dc'd home yesterday however still here. Missed appt at HD center today. No spot available to have HD there tomorrow. Will need to stay in hospital over night again for HD tomorrow. No C/O chest pain/SOB.   Objective Vitals:   04/09/17 1549 04/09/17 1952 04/09/17 2125 04/10/17 0644  BP: (!) 96/54 123/66  129/63  Pulse:  (!) 56 (!) 54 66  Resp:  (!) 8  11  Temp: 98.2 F (36.8 C) 97.8 F (36.6 C)  (!) 97.5 F (36.4 C)  TempSrc: Oral Oral  Oral  SpO2: 93% 96%  96%  Weight: 56.3 kg (124 lb 1.9 oz)   55.4 kg (122 lb 2.2 oz)  Height: 5\' 5"  (1.651 m)      Physical Exam General: Pleasant, NAD Heart: O9,B3, 3/6 systolic M. No JVD. SR.  Lungs: CTAB A/P Abdomen: Active BS. Foley patent cloudy urine Extremities: R BKA no stump edema, trace LLE edema Dialysis Access: RIJ [facility] drsg CDI   Additional Objective Labs: Basic Metabolic Panel: Recent Labs  Lab 04/08/17 0319 04/09/17 0328 04/10/17 0333  NA 129* 130* 130*  K 3.6 3.8 4.0  CL 93* 96* 95*  CO2 22 23 26   GLUCOSE 180* 173* 190*  BUN 41* 23* 34*  CREATININE 6.46* 4.28* 6.23*  CALCIUM 8.1* 8.3* 8.4*  PHOS 6.4* 3.5 3.4   Liver Function Tests: Recent Labs  Lab 04/04/17 1021  04/08/17 0319 04/09/17 0328 04/10/17 0333  AST 42*  --   --   --   --   ALT 11*  --   --   --   --   ALKPHOS 75  --   --   --   --   BILITOT 0.6  --   --   --   --   PROT 8.2*  --   --   --   --   ALBUMIN 3.5   < > 2.8* 2.8* 2.7*   < > = values in this interval not displayed.   Recent Labs  Lab 04/05/17 1812  LIPASE 56*   CBC: Recent Labs  Lab 04/06/17 1400 04/07/17 0356 04/08/17 0319 04/09/17 0328 04/10/17 0333  WBC 6.7 8.0 6.6 5.0 5.7  NEUTROABS  --   --   --   --  3.9  HGB 9.0* 8.7* 8.1* 7.7* 7.6*  HCT 28.5* 27.6* 25.3* 23.8* 24.2*  MCV 94.7 93.9 93.7 93.7 94.5  PLT 167 157 146* 135* 138*   Blood Culture    Component Value Date/Time   SDES URINE,  CATHETERIZED 04/06/2017 0628   SPECREQUEST NONE 04/06/2017 0628   CULT (A) 04/06/2017 0628    80,000 COLONIES/mL ENTEROCOCCUS FAECALIS REPEATING SENSITIVITIES    REPTSTATUS PENDING 04/06/2017 0628    Cardiac Enzymes: Recent Labs  Lab 04/04/17 1910 04/05/17 0026 04/05/17 1048 04/05/17 1346 04/07/17 2125  CKTOTAL  --   --   --  532*  --   CKMB  --   --   --  44.5*  --   TROPONINI >65.00* >65.00* 60.15*  --  20.08*   CBG: Recent Labs  Lab 04/09/17 1627 04/09/17 2143 04/09/17 2225 04/10/17 0214 04/10/17 0801  GLUCAP 262* 61* 98 213* 166*   Iron Studies: No results for input(s): IRON, TIBC, TRANSFERRIN, FERRITIN in the last 72 hours. @lablastinr3 @ Studies/Results: No results found. Medications: . sodium chloride    . sodium chloride     . amoxicillin  500 mg Oral Q24H  . aspirin  81 mg Oral Daily  . atorvastatin  80 mg Oral q1800  . citalopram  20 mg Oral Daily  . clopidogrel  75 mg Oral Q breakfast  . darbepoetin (ARANESP) injection - DIALYSIS  100 mcg Intravenous Q Fri-HD  . docusate sodium  100 mg Oral BID  . doxercalciferol  2 mcg Intravenous Q M,W,F-HD  . insulin aspart  0-9 Units Subcutaneous TID WC  . lanthanum  1,000 mg Oral TID WC  . levothyroxine  25 mcg Oral QAC breakfast  . mouth rinse  15 mL Mouth Rinse BID  . metoCLOPramide (REGLAN) injection  5 mg Intravenous Q12H  . metoprolol tartrate  25 mg Oral BID  . polyethylene glycol  17 g Oral Once  . senna-docusate  1 tablet Oral QHS  . thiamine  100 mg Oral Daily     Dialysis Orders: [facility] MWF 4h 56kg Hep 4000 R IJ cath BFR400/Auto 1.5, Linear sodium UFP 4 - Hectorol 2 mcg IV TIW (no recent PTH) - Mircera 150 mcg IV q 2 weeks (last dose 50 mcg IV 03/23/17 Last HGB 8.6 04/09/17) - home BP Rx > norvasc 10 qd/ metop 25 bid  Impression: 1. Acute NSTEMI/ prior CABG/ trop >60 - s/p LHC 12/26 w/ stenotic LIMA graft S/P PTCI to LIMA 04/08/17 per Dr. Claiborne Billings. Medical management for residual disease.  OK from cardiology stand point to DC home.   2. ESRD: cont HD MWF.  Was supposed to have HD todayon Holiday schedule however still in hospital. HD tomorrow on schedule. K+ 4.0 3. HTN/volume: BP stable. Wt 55.4 kg this AM. 1-1.5 liters with HD tomorrow 4. Anemia of CKD: HGB 7.6 today. Did receive Darbe 100 mcg IV with HD 04/08/17. Follow HGB. No need for transfusion unless she develops symptomatic anemia.   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 Was scheduled for cystectomy and B nephrectomy in mid-Jan, now pushed back until 3 mos after PCI, see #1. Per ONC, chemoRx not helpful w/ this cancer. Surgical plan intent is curative. 6. CAD (Hx CABG): See above. 7. BMD: Phos 3.4 Ca 8.4 Continue binders/VDRA.  8. Enterococcus UTI: On amoxicillin per primary  Disposition: OK from renal stand point for DC however will NEED to stay in hospital until she gets HD tomorrow. Otherwise, she will have to wait until Wednesday for HD.    Rita H. Brown NP-C 04/10/2017, 8:28 AM  [facility] [phone]  Pt seen, examined and agree w A/P as above.  [name] MD [employer] pager [phone]   04/10/2017, 4:39 PM

## 2017-04-10 NOTE — Progress Notes (Signed)
Family Medicine Teaching Service Daily Progress Note Intern Pager: [phone]  Patient name: Brandi Arellano Medical record number: [id number] Date of birth: 1955-04-28 Age: 61 y.o. Gender: female  Primary Care Provider: Kerin Perna, NP Consultants: nephrology, cardiology, urology  Code Status: partial DNI   [name]:  Admitted to [facility] on 04/04/16 for nausea/vomiting and hypoxia   Assessment and Plan: Brandi Arellano a 61 y.o.femalepresenting with nausea and vomiting, found to be hypoxic with bilateral pleural effusions. PMH is significant forESRD, HTN, T2DM, invasive bladder cancer  Acute Hypoxia-resolved Sats at 96% on RA. No history of oxygen requirement.CXR showingmild vascular congestion and small right effusion. Mild leukocytosis on admission that is now resolved, patient is afebrile, no signs of pneumonia on CXR. Hypoxia may have been secondary to acute coronary event. -continuous pulse ox and cardiac monitoring -supplemental O2 as needed to keep sats > 90%  NSTEMI  Troponin 8.52> 30>(>65)>(>65)>60>20.Hx of CAD. EKG with LBBB. Cardiology was consulted and started patient on heparin drip. EKG similar to previous. Total CK 532. Cardiac cath showing ostial LIMA 80% stenosis with subclavian stent proximal with tight diagonal lesions. Patient had PCI of ostial LIMA on 12/27 -Cardiology consulted, appreciate recommendations: If continues to experience symptoms, atherectomy with rotoblater next week - for now, favor medical management with anticoag, asa, plavix, and bb - continue metoprolol and lipitor - prn nitro - ok for dc from cardiology standpoint per their notes  Mild to moderate AS Echo showing mild to moderate aortic stenosis. Recommended by cardiology for yearly echo -appreciate cardiology recommendations   Pyuria Treating enterococcus UTI. Sensitive to ampicillin. Will treat with amoxcil. Per pharmacy recommendations will dose  500mg  q 24 hours 2/2 ESRD. -Urine culture, enterococcus faecalis, awaiting sensitivities -s/p ctx 5 day course - amoxil 500mg  q 24 hours for 5 days  Nausea and vomiting Resolved. KUB with a large amount of stool in the rectal vault may represent fecal impaction. No evidence of obstruction. - refused enema 12/28 - Continue give metoclopramide 5 mg BID, dose adjusted for kidney function  - Zofran as needed for nausea  -oxy IR 5 mg every 3 hour prn - miralax 17g daily - senna-docusate 1 tablet daily  ESRD, MWF HD Last HD on 12/28, scheduled 12/30 -nephrology following, appreciate recommendations  -daily renal function panels  Invasive bladder cancer chronic indwelling Foley in place - urology to arrange outpatient follow up - likely curative intent surgery in April 2019  HTN Hypotensive overnight but currently BP 150/61 -Will hold amlodipine for now  -continuemetop -fluid bolus as needed   T2DM last A1C 6.8 02/07/17. Not on any home meds. Glucose12/28 74s-250s -sensitiveSSI -CBGs AC/HS  Hypothyroidism stable, on synthroid -continue home synthroid -check TSH  HLD stable, on lipitor -continue home medication  Depression Patient reported by nursing staff to be depressed and have decreased appetite due to depression. -will start celexa 20mg  daily   FEN/GI:renal/carb modified Prophylaxis:heparin  Disposition: likely home  Subjective:  No complaints this am, wants to be discharged.  Objective: Temp:  [97.5 F (36.4 C)-98.2 F (36.8 C)] 97.5 F (36.4 C) (12/30 0644) Pulse Rate:  [54-66] 66 (12/30 0644) Resp:  [8-11] 11 (12/30 0644) BP: (96-140)/(54-66) 129/63 (12/30 0644) SpO2:  [93 %-96 %] 96 % (12/30 0644) Weight:  [122 lb 2.2 oz (55.4 kg)-124 lb 1.9 oz (56.3 kg)] 122 lb 2.2 oz (55.4 kg) (12/30 2297) Physical Exam: General: awake and alert, NAD, pleasant Cardiovascular: 3/6 systolic murmur, no MRG, dialysis  catheter in right  chest Respiratory: CTAB, no wheezes, rales or rhonchi  Abdomen: soft, non tender, non distended, bowel sounds normal  Extremities: non tender, 5/5 strength in all remaining extremities, 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 GU: foley catheter in place  Laboratory: Recent Labs  Lab 04/08/17 0319 04/09/17 0328 04/10/17 0333  WBC 6.6 5.0 5.7  HGB 8.1* 7.7* 7.6*  HCT 25.3* 23.8* 24.2*  PLT 146* 135* 138*   Recent Labs  Lab 04/04/17 1021  04/08/17 0319 04/09/17 0328 04/10/17 0333  NA 140   < > 129* 130* 130*  K 4.1   < > 3.6 3.8 4.0  CL 99*   < > 93* 96* 95*  CO2 27   < > 22 23 26   BUN 33*   < > 41* 23* 34*  CREATININE 5.10*   < > 6.46* 4.28* 6.23*  CALCIUM 9.7   < > 8.1* 8.3* 8.4*  PROT 8.2*  --   --   --   --   BILITOT 0.6  --   --   --   --   ALKPHOS 75  --   --   --   --   ALT 11*  --   --   --   --   AST 42*  --   --   --   --   GLUCOSE 281*   < > 180* 173* 190*   < > = values in this interval not displayed.     Ref. Range 04/04/2017 19:10 04/05/2017 00:26 04/05/2017 10:48 04/07/2017 21:25  Troponin I Latest Ref Range: <0.03 ng/mL >65.00 (HH) >65.00 (HH) 60.15 (HH) 20.08 (HH)    Imaging/Diagnostic Tests: Dg Chest 2 View  Result Date: 04/05/2017 CLINICAL DATA:  Abdominal pain with nausea and vomiting 3 days. Dry cough. EXAM: CHEST  2 VIEW COMPARISON:  04/04/2017 FINDINGS: Sternotomy wires and right IJ dialysis catheter unchanged. Lungs are adequately inflated with minimal prominence of the perihilar markings suggesting mild vascular congestion. Small amount of posterior right pleural fluid. Cardiomediastinal silhouette and remainder of the exam is unchanged. IMPRESSION: Findings suggesting mild vascular congestion.  Small right effusion. Electronically Signed   By: Marin Olp M.D.   On: 04/05/2017 10:18   Abd 1 View (kub)  Result Date: 04/04/2017 CLINICAL DATA:  61 year old female with abdominal pain, nausea vomiting. EXAM: ABDOMEN - 1 VIEW COMPARISON:  CT of the abdomen  pelvis dated 02/06/2017 FINDINGS: There is no bowel dilatation or evidence of obstruction. No free air identified on the provided images. There is extensive atherosclerotic calcification of the aorta and mesenteric vasculature. Large amount of stool noted in the rectal vault. The osseous structures and soft tissues appear unremarkable. IMPRESSION: Large amount of stool in the rectal vault may represent fecal impaction. No evidence of obstruction. Electronically Signed   By: Anner Crete M.D.   On: 04/04/2017 21:49   Dg Chest Portable 1 View  Result Date: 04/04/2017 CLINICAL DATA:  Hypoxia. EXAM: PORTABLE CHEST 1 VIEW COMPARISON:  Radiograph of February 07, 2017. FINDINGS: Stable cardiomegaly with central pulmonary vascular congestion. Atherosclerosis of thoracic aorta is noted. Bilateral pulmonary edema is noted with right worse than left. No pneumothorax or pleural effusion is noted. Right internal jugular dialysis catheter is unchanged in position. Sternotomy wires are noted. Bony thorax is unremarkable. IMPRESSION: Bilateral pulmonary edema is noted, right worse than left. Aortic atherosclerosis. Electronically Signed   By: Marijo Conception, M.D.   On: 04/04/2017 10:48  Guadalupe Dawn, MD 04/10/2017, 10:11 AM PGY-1, [place] Intern pager: [phone], text pages welcome

## 2017-04-11 LAB — RENAL FUNCTION PANEL
ALBUMIN: 2.5 g/dL — AB (ref 3.5–5.0)
ANION GAP: 13 (ref 5–15)
BUN: 40 mg/dL — ABNORMAL HIGH (ref 6–20)
CALCIUM: 8.1 mg/dL — AB (ref 8.9–10.3)
CO2: 25 mmol/L (ref 22–32)
Chloride: 92 mmol/L — ABNORMAL LOW (ref 101–111)
Creatinine, Ser: 7.85 mg/dL — ABNORMAL HIGH (ref 0.44–1.00)
GFR calc non Af Amer: 5 mL/min — ABNORMAL LOW (ref >60)
GFR, EST AFRICAN AMERICAN: 6 mL/min — AB (ref >60)
Glucose, Bld: 152 mg/dL — ABNORMAL HIGH (ref 65–99)
PHOSPHORUS: 4 mg/dL (ref 2.5–4.6)
POTASSIUM: 4 mmol/L (ref 3.5–5.1)
SODIUM: 130 mmol/L — AB (ref 135–145)

## 2017-04-11 LAB — CBC
HCT: 22.3 % — ABNORMAL LOW (ref 36.0–46.0)
Hemoglobin: 7.1 g/dL — ABNORMAL LOW (ref 12.0–15.0)
MCH: 30.3 pg (ref 26.0–34.0)
MCHC: 31.8 g/dL (ref 30.0–36.0)
MCV: 95.3 fL (ref 78.0–100.0)
Platelets: 145 10*3/uL — ABNORMAL LOW (ref 150–400)
RBC: 2.34 MIL/uL — ABNORMAL LOW (ref 3.87–5.11)
RDW: 17.9 % — ABNORMAL HIGH (ref 11.5–15.5)
WBC: 6 K/uL (ref 4.0–10.5)

## 2017-04-11 LAB — POCT ACTIVATED CLOTTING TIME
ACTIVATED CLOTTING TIME: 191 s
ACTIVATED CLOTTING TIME: 164 s
Activated Clotting Time: 224 seconds

## 2017-04-11 LAB — PREPARE RBC (CROSSMATCH)

## 2017-04-11 LAB — GLUCOSE, CAPILLARY: Glucose-Capillary: 132 mg/dL — ABNORMAL HIGH (ref 65–99)

## 2017-04-11 MED ORDER — HEPARIN SODIUM (PORCINE) 1000 UNIT/ML DIALYSIS: 4000.0000 [IU] | Freq: Once | INTRAMUSCULAR | Status: DC

## 2017-04-11 MED ORDER — CLOPIDOGREL BISULFATE 75 MG PO TABS: 75.0000 mg | ORAL_TABLET | Freq: Every day | ORAL | 0 refills | Status: AC

## 2017-04-11 MED ORDER — CITALOPRAM HYDROBROMIDE 20 MG PO TABS: 20.0000 mg | ORAL_TABLET | Freq: Every day | ORAL | 0 refills | Status: DC

## 2017-04-11 MED ORDER — DOXERCALCIFEROL 4 MCG/2ML IV SOLN
INTRAVENOUS | Status: AC
  Filled 2017-04-11: qty 2

## 2017-04-11 MED ORDER — SODIUM CHLORIDE 0.9 % IV SOLN
Freq: Once | INTRAVENOUS | Status: AC
  Administered 2017-04-11: 11:00:00 via INTRAVENOUS

## 2017-04-11 MED ORDER — SODIUM CHLORIDE 0.9 % IV SOLN: 100.0000 mL | INTRAVENOUS | Status: DC | PRN

## 2017-04-11 NOTE — Procedures (Signed)
I was present at this dialysis session. I have reviewed the session itself and made appropriate changes.   Filed Weights   04/09/17 1549 04/10/17 0644 04/11/17 0635  Weight: 56.3 kg (124 lb 1.9 oz) 55.4 kg (122 lb 2.2 oz) 54.6 kg (120 lb 5.9 oz)    Recent Labs  Lab 04/11/17 0500  NA 130*  K 4.0  CL 92*  CO2 25  GLUCOSE 152*  BUN 40*  CREATININE 7.85*  CALCIUM 8.1*  PHOS 4.0    Recent Labs  Lab 04/09/17 0328 04/10/17 0333 04/11/17 0630  WBC 5.0 5.7 6.0  NEUTROABS  --  3.9  --   HGB 7.7* 7.6* 7.1*  HCT 23.8* 24.2* 22.3*  MCV 93.7 94.5 95.3  PLT 135* 138* 145*    Scheduled Meds: . amoxicillin  500 mg Oral Q24H  . aspirin  81 mg Oral Daily  . atorvastatin  80 mg Oral q1800  . citalopram  20 mg Oral Daily  . clopidogrel  75 mg Oral Q breakfast  . darbepoetin (ARANESP) injection - DIALYSIS  100 mcg Intravenous Q Fri-HD  . docusate sodium  100 mg Oral BID  . doxercalciferol  2 mcg Intravenous Q M,W,F-HD  . heparin  4,000 Units Dialysis Once in dialysis  . insulin aspart  0-9 Units Subcutaneous TID WC  . lanthanum  1,000 mg Oral TID WC  . levothyroxine  25 mcg Oral QAC breakfast  . mouth rinse  15 mL Mouth Rinse BID  . metoCLOPramide (REGLAN) injection  5 mg Intravenous Q12H  . metoprolol tartrate  25 mg Oral BID  . polyethylene glycol  17 g Oral Once  . senna-docusate  1 tablet Oral QHS  . thiamine  100 mg Oral Daily   Continuous Infusions: . sodium chloride    . sodium chloride    . sodium chloride    . sodium chloride     PRN Meds:.sodium chloride, sodium chloride, sodium chloride, sodium chloride, acetaminophen, alteplase, heparin, lanthanum, lidocaine (PF), lidocaine-prilocaine, nitroGLYCERIN, ondansetron **OR** ondansetron (ZOFRAN) IV, oxyCODONE, pentafluoroprop-tetrafluoroeth   Donetta Potts,  MD 04/11/2017, 9:08 AM

## 2017-04-11 NOTE — Progress Notes (Signed)
Family Medicine Teaching Service Daily Progress Note Intern Pager: [phone]  Patient name: Brandi Arellano Medical record number: [id number] Date of birth: [birth year] Age: 61 y.o. Gender: female  Primary Care Provider: Kerin Perna, NP Consultants: nephrology, cardiology, urology  Code Status: partial DNI   [name]:  Admitted to [facility] on 04/04/16 for nausea/vomiting and hypoxia   Assessment and Plan: Brandi Arellano a 61 y.o.femalepresenting with nausea and vomiting, found to be hypoxic with bilateral pleural effusions. PMH is significant forESRD, HTN, T2DM, invasive bladder cancer  Acute Hypoxia-resolved Sats at 94% on RA. No history of oxygen requirement.CXR showingmild vascular congestion and small right effusion. Mild leukocytosis on admission that is now resolved, patient is afebrile, no signs of pneumonia on CXR. Hypoxia may have been secondary to acute coronary event. -continuous pulse ox and cardiac monitoring -supplemental O2 as needed to keep sats > 90%  NSTEMI  Troponin 8.52> 30>(>65)>(>65)>60>20.Hx of CAD. EKG with LBBB. Cardiology was consulted and started patient on heparin drip. EKG similar to previous. Total CK 532. Cardiac cath showing ostial LIMA 80% stenosis with subclavian stent proximal with tight diagonal lesions. Patient had PCI of ostial LIMA on 12/27 -Cardiology consulted, appreciate recommendations: If continues to experience symptoms, atherectomy with rotoblater next week - for now, favor medical management with anticoag, asa, plavix, and bb - continue metoprolol and lipitor - prn nitro - ok for dc from cardiology standpoint per their notes  Mild to moderate AS Echo showing mild to moderate aortic stenosis. Recommended by cardiology for yearly echo -appreciate cardiology recommendations   Pyuria Treating enterococcus UTI. Sensitive to ampicillin. Will treat with amoxcil. Per pharmacy recommendations will dose  500mg  q 24 hours 2/2 ESRD. - Urine culture, enterococcus faecalis, sensitive to amp - s/p ctx 5 day course - amoxil 500mg  q 24 hours for 5 days (day 2)  Nausea and vomiting Resolved. KUB with a large amount of stool in the rectal vault may represent fecal impaction. No evidence of obstruction. - refused enema 12/28 - Continue give metoclopramide 5 mg BID, dose adjusted for kidney function  - Zofran as needed for nausea  -oxy IR 5 mg every 3 hour prn - miralax 17g daily - senna-docusate 1 tablet daily  ESRD, MWF HD Last HD on 12/28, scheduled 12/31 -nephrology following, appreciate recommendations  -daily renal function panels  Invasive bladder cancer chronic indwelling Foley in place - urology to arrange outpatient follow up - likely curative intent surgery in April 2019  HTN Hypotensive overnight but currently 112/52 -Will hold amlodipine for now  -continuemetop -fluid bolus as needed   T2DM last A1C 6.8 02/07/17. Not on any home meds. Last glucose 152 -sensitiveSSI -CBGs AC/HS  Hypothyroidism stable, on synthroid -continue home synthroid -check TSH  HLD stable, on lipitor -continue home medication  Depression Patient reported by nursing staff to be depressed and have decreased appetite due to depression. -continue start celexa 20mg  daily   FEN/GI:renal/carb modified Prophylaxis:heparin  Disposition: home  Subjective:  Doing well in HD this am. No acute distress. Ready to go home.  Objective: Temp:  [97.8 F (36.6 C)-98.4 F (36.9 C)] 98.4 F (36.9 C) (12/31 0511) Pulse Rate:  [54-63] 54 (12/31 0511) Resp:  [13] (P) 13 (12/31 0635) BP: (98-129)/(52-74) 112/52 (12/31 0511) SpO2:  [92 %-98 %] 94 % (12/31 0511) Weight:  [120 lb 5.9 oz (54.6 kg)] (P) 120 lb 5.9 oz (54.6 kg) (12/31 0272) Physical Exam: General: awake and alert, NAD, pleasant, sitting  in HD Cardiovascular: 3/6 systolic murmur, no MRG, dialysis catheter in right  chest Respiratory: CTAB, no wheezes, rales or rhonchi  Abdomen: soft, non tender, non distended, bowel sounds present Extremities: non tender, 5/5 strength in all remaining extremities, 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 GU: foley catheter in place  Laboratory: Recent Labs  Lab 04/09/17 0328 04/10/17 0333 04/11/17 0630  WBC 5.0 5.7 6.0  HGB 7.7* 7.6* 7.1*  HCT 23.8* 24.2* 22.3*  PLT 135* 138* 145*   Recent Labs  Lab 04/04/17 1021  04/09/17 0328 04/10/17 0333 04/11/17 0500  NA 140   < > 130* 130* 130*  K 4.1   < > 3.8 4.0 4.0  CL 99*   < > 96* 95* 92*  CO2 27   < > 23 26 25   BUN 33*   < > 23* 34* 40*  CREATININE 5.10*   < > 4.28* 6.23* 7.85*  CALCIUM 9.7   < > 8.3* 8.4* 8.1*  PROT 8.2*  --   --   --   --   BILITOT 0.6  --   --   --   --   ALKPHOS 75  --   --   --   --   ALT 11*  --   --   --   --   AST 42*  --   --   --   --   GLUCOSE 281*   < > 173* 190* 152*   < > = values in this interval not displayed.     Ref. Range 04/04/2017 19:10 04/05/2017 00:26 04/05/2017 10:48 04/07/2017 21:25  Troponin I Latest Ref Range: <0.03 ng/mL >65.00 (HH) >65.00 (HH) 60.15 (HH) 20.08 (HH)    Imaging/Diagnostic Tests: Dg Chest 2 View  Result Date: 04/05/2017 CLINICAL DATA:  Abdominal pain with nausea and vomiting 3 days. Dry cough. EXAM: CHEST  2 VIEW COMPARISON:  04/04/2017 FINDINGS: Sternotomy wires and right IJ dialysis catheter unchanged. Lungs are adequately inflated with minimal prominence of the perihilar markings suggesting mild vascular congestion. Small amount of posterior right pleural fluid. Cardiomediastinal silhouette and remainder of the exam is unchanged. IMPRESSION: Findings suggesting mild vascular congestion.  Small right effusion. Electronically Signed   By: Marin Olp M.D.   On: 04/05/2017 10:18   Abd 1 View (kub)  Result Date: 04/04/2017 CLINICAL DATA:  61 year old female with abdominal pain, nausea vomiting. EXAM: ABDOMEN - 1 VIEW COMPARISON:  CT of the abdomen  pelvis dated 02/06/2017 FINDINGS: There is no bowel dilatation or evidence of obstruction. No free air identified on the provided images. There is extensive atherosclerotic calcification of the aorta and mesenteric vasculature. Large amount of stool noted in the rectal vault. The osseous structures and soft tissues appear unremarkable. IMPRESSION: Large amount of stool in the rectal vault may represent fecal impaction. No evidence of obstruction. Electronically Signed   By: Anner Crete M.D.   On: 04/04/2017 21:49   Dg Chest Portable 1 View  Result Date: 04/04/2017 CLINICAL DATA:  Hypoxia. EXAM: PORTABLE CHEST 1 VIEW COMPARISON:  Radiograph of February 07, 2017. FINDINGS: Stable cardiomegaly with central pulmonary vascular congestion. Atherosclerosis of thoracic aorta is noted. Bilateral pulmonary edema is noted with right worse than left. No pneumothorax or pleural effusion is noted. Right internal jugular dialysis catheter is unchanged in position. Sternotomy wires are noted. Bony thorax is unremarkable. IMPRESSION: Bilateral pulmonary edema is noted, right worse than left. Aortic atherosclerosis. Electronically Signed   By: Marijo Conception, M.D.  On: 04/04/2017 10:48     Guadalupe Dawn, MD 04/11/2017, 8:02 AM PGY-1, [facility] Intern pager: [phone], text pages welcome

## 2017-04-11 NOTE — Progress Notes (Signed)
Pt received 25 mg metoprolol at HS as ordered.  Pt SB/SR on telemetry with heart rate 48-62.  Pt resting without complaints.  Will cont to monitor pt closely.

## 2017-04-11 NOTE — Progress Notes (Signed)
Patient's follow-up appt was scheduled at the same time as dialysis.  Rescheduled for 1:30pm.

## 2017-04-11 NOTE — Discharge Instructions (Signed)
You underwent a necessary catheter procedure for a heart blockage.  You began having shortness of breath and other symptoms prompting your admission following this procedure.  We evaluated you with cardiology's recommendations.  Please follow-up at their office outpatient for further management.  I have also given you a 30-day prescription for your Celexa.  Please continue taking this medication daily.  Your primary care physician can manage this at follow-up.

## 2017-04-11 NOTE — Telephone Encounter (Signed)
Pt currently admitted for laparoscopic surgery.

## 2017-04-12 LAB — TYPE AND SCREEN
ABO/RH(D): O POS
ANTIBODY SCREEN: NEGATIVE
DONOR AG TYPE: NEGATIVE
Unit division: 0

## 2017-04-12 LAB — BPAM RBC
Blood Product Expiration Date: 201901112359
ISSUE DATE / TIME: 201812311107
UNIT TYPE AND RH: 5100

## 2017-04-14 NOTE — Telephone Encounter (Signed)
Left message to call back  

## 2017-04-15 NOTE — Telephone Encounter (Signed)
Left message to call back  

## 2017-04-18 ENCOUNTER — Ambulatory Visit: Payer: Medicaid Other | Admitting: Cardiology

## 2017-04-18 ENCOUNTER — Encounter: Payer: Self-pay | Admitting: Cardiology

## 2017-04-18 VITALS — BP 128/60 | HR 63 | Resp 16 | Ht 65.0 in | Wt 117.0 lb

## 2017-04-18 DIAGNOSIS — I2510 Atherosclerotic heart disease of native coronary artery without angina pectoris: Secondary | ICD-10-CM | POA: Diagnosis not present

## 2017-04-18 DIAGNOSIS — Z9861 Coronary angioplasty status: Secondary | ICD-10-CM

## 2017-04-18 DIAGNOSIS — I214 Non-ST elevation (NSTEMI) myocardial infarction: Secondary | ICD-10-CM | POA: Diagnosis not present

## 2017-04-18 NOTE — Telephone Encounter (Signed)
Pt saw B. Simmons, PA today 

## 2017-04-18 NOTE — Patient Instructions (Signed)
Medication Instructions:  Your physician recommends that you continue on your current medications as directed. Please refer to the Current Medication list given to you today.   Labwork: None ordered today  Testing/Procedures: None ordered today  Follow-Up: Your physician recommends that you schedule a follow-up appointment on June 16, 2017 with Dr. Radford Pax at 1:20 pm   Any Other Special Instructions Will Be Listed Below (If Applicable).     If you need a refill on your cardiac medications before your next appointment, please call your pharmacy.

## 2017-04-18 NOTE — Progress Notes (Signed)
04/18/2017 Brandi Arellano   10-20-1955  [id number]  Primary Physician Kerin Perna, NP Primary Cardiologist: Dr. Radford Pax    Reason for Visit/CC: [facility] F/u for CAD s/p PCI  HPI:  Nayda Riesen a 62 y.o.femalewith a hx of CAD s/p remote CABG, ESRD on HD, PVDs/p right BKA, HTN, HLD, DMand recent diagnosis of bladder cancer, s/p recent surgical resectionof tumor. Pathology revealed squamous cell carcinoma. Planisto ultimately proceed with curative intent surgery. Pt has been resistant to idea of palliative care.   Recently, pt had LHC 02/11/17 by Dr. Saunders Revel on 02/11/17 which showed severenative coronary artery disease, including heavily calcified and diffusely diseased LAD with distal occlusion, chronic total occlusion of the ostial LCx, and chronic total occlusion of ostial RCA.She was noted to have a patent LIMA to distal LAD with 50% ostial stenosis as well as patent SVG-OM and SVG-RCA. Both grafts supplies small, diffusely diseased vessels. There were no good targets for PCI. Dr. Saunders Revel recommended continued medical therapy.  However, she presented back to [facility] on 04/04/17 with severe epigastric pain, n&v and ruled in for NSTEMI. Initial troponin was 8 and subsequent troponin levels continued to show upward trend. Troponin level rose to > 65. She was started on IV heparin. Cath showedostial LIMA80% stenosiswith subclavian stent proximal to this and tight diagonal lesions. Detailed report as below. Initial plan for PCI of ostial LIMA and staged PCI of diagonal with Rotablator. She underwent successful PTCA and DES to ostial LIMA graft 04/07/17 by Dr. Claiborne Billings. He recommended medical therapy for residual disease. If she continues to experience symptomatology, consider atherectomy of the proximal LAD prior to a large first diagonal takeoff. Also of note, echo showed mildly reduced EF at 40-45%. She is on DAPT with ASA and Plavix, high intensity statin, metoprolol and amlodipine.    She presents back to clinic for f/u. She reports doing much better. She denies any CP or dyspnea. No issues since discharge. She reports full med compliance. BP is well controlled.    Procedures- Diagnostic LHC 04/06/17  LEFT HEART CATH AND CORONARY ANGIOGRAPHY  Conclusion     Ost RCA to Prox RCA lesion is 100% stenosed.  Mid RCA to Dist RCA lesion is 100% stenosed with 100% stenosed side branch in Acute Mrg.  Prox Cx to Mid Cx lesion is 100% stenosed with 100% stenosed side branch in Ost 2nd Mrg.  Ost LAD to Prox LAD lesion is 80% stenosed.  Prox LAD lesion is 90% stenosed.  Mid LAD-1 lesion is 90% stenosed.  Mid LAD-2 lesion is 95% stenosed.  Origin lesion is 80% stenosed.  Ost 1st Diag lesion is 50% stenosed.   Severe native CAD with calcification and evidence for probable 80% calcified stenoses in the very proximal LAD prior to a moderate size bifurcating diagonal vessel with 50% narrowing in the diagonal vessel and 80% in the distal superior branch of this vessel, diffuse 80-90% and 95% multiple stenoses in a severely calcified mid LAD prior to mid-distal diagonal vessel with the LIMA graft inserting beyond this diagonal vessel; total occlusion of the right proximal circumflex after a diminutive first marginal branch, and total occlusion of the RCA at its ostium.  Subclavian stent with mild luminal irregularity.  80% eccentric ostial stenosis in the LIMA which supplies the distal LAD after mid-distal diagonal vessel.  Patent vein graft supplying the distal marginal vessel of the circumflex coronary artery.  Patent vein graft supplying the distal RCA.  There are septal collaterals from the  distal RCA towards the LAD.     LHC- Intervention w/ Coronary Stent Placement 04/07/17  Successful percutaneous coronary intervention to a calcified 80-85% ostial LIMA graft stenosis treated with a 2.010 mm and 2.510 mm Wilverine cutting balloon for predilatation, and DES  stenting with a 3.012 mm Synergy stent postdilated to 3.26 mm with the stenosis being reduced to 0%  RECOMMENDATION: The patient will continue with DAPT.  Medical therapy for significant concomitant CAD.  If she continues to experience symptomatology, consider atherectomy of the proximal LAD prior to a large first diagonal takeoff.  Current Meds  Medication Sig  . amLODipine (NORVASC) 10 MG tablet Take 10 mg by mouth daily with breakfast.  . aspirin EC 81 MG tablet Take 81 mg by mouth daily with breakfast.   . atorvastatin (LIPITOR) 40 MG tablet Take 40 mg by mouth at bedtime.   . citalopram (CELEXA) 20 MG tablet Take 1 tablet (20 mg total) by mouth daily.  . clopidogrel (PLAVIX) 75 MG tablet Take 1 tablet (75 mg total) by mouth daily with breakfast.  . lanthanum (FOSRENOL) 1000 MG chewable tablet Chew 1,000 mg by mouth See admin instructions. Crush and sprinkle over food one tablet (1000 mg) three times daily with meals  . levothyroxine (SYNTHROID, LEVOTHROID) 25 MCG tablet Take 25 mcg by mouth daily with breakfast.   . metoprolol tartrate (LOPRESSOR) 25 MG tablet Take 1 tablet (25 mg total) by mouth 2 (two) times daily. (Patient taking differently: Take 25 mg by mouth See admin instructions. Take 1 tablet (25 mg) by mouth twice daily - with breakfast (5am) and with lunch (noon))  . senna-docusate (SENOKOT-S) 8.6-50 MG tablet Take 1 tablet by mouth at bedtime as needed for mild constipation.  . thiamine 100 MG tablet Take 1 tablet (100 mg total) by mouth daily. (Patient taking differently: Take 100 mg by mouth daily with breakfast. Vitamin B1)   Allergies  Allergen Reactions  . Diphenhydramine Nausea And Vomiting  . Tramadol Hcl Nausea And Vomiting  . Morphine And Related Nausea And Vomiting   Past Medical History:  Diagnosis Date  . Anemia   . Bladder mass 02/09/2017  . CAD (coronary artery disease)    CABG  . CHF (congestive heart failure) (HCC)    Acute on chronic diastolic  .  Complication of anesthesia   . Diabetes mellitus without complication (HCC)    Type II  . DVT (deep venous thrombosis) ([place])   . ESRD (end stage renal disease) ([place])    HD M-W-F  . Gangrene of lower extremity (HCC)    RLE  . GERD (gastroesophageal reflux disease)   . Hx of AKA (above knee amputation), right ([place])   . Hyperlipidemia   . Hypertension   . Hypothyroidism   . PONV (postoperative nausea and vomiting)   . Stroke [facility])    "light stroke" no deficits  . Thrombocytopenia ([place])   . Thyroid disease    Abnormal Thyroid function Test  . UTI (lower urinary tract infection)    Family History  Adopted: Yes  Problem Relation Age of Onset  . Hypertension Father   . Heart disease Father        Coronary Artery Disease   Past Surgical History:  Procedure Laterality Date  . ABDOMINAL ANGIOGRAM  12/26/2013   Procedure: ABDOMINAL ANGIOGRAM;  Surgeon: Serafina Hilyard, MD;  Location: [facility] CATH LAB;  Service: Cardiovascular;;  . AMPUTATION Right 02/01/2014   Procedure: Right Below Knee Amputation;  Surgeon: Beverely Low  Fernanda Drum, MD;  Location: [place];  Service: Orthopedics;  Laterality: Right;  . AMPUTATION Right 03/24/2014   Procedure: AMPUTATION ABOVE KNEE;  Surgeon: Newt Minion, MD;  Location: [place];  Service: Orthopedics;  Laterality: Right;  . ARCH AORTOGRAM  12/26/2013   Procedure: ARCH AORTOGRAM;  Surgeon: Serafina Duffett, MD;  Location: [facility] CATH LAB;  Service: Cardiovascular;;  . AXILLARY ARTERY - BRACHIAL ARTERY BYPASS GRAFT  11/07/2013   At [place]     Bilateral  . CHOLECYSTECTOMY    . CORONARY ARTERY BYPASS GRAFT  2012   in [place], [place] N/A 04/07/2017   Procedure: CORONARY STENT INTERVENTION;  Surgeon: Troy Sine, MD;  Location: [place] CV LAB;  Service: Cardiovascular;  Laterality: N/A;  . CYSTOSCOPY W/ RETROGRADES Left 03/01/2017   Procedure: CYSTOSCOPY WITH RETROGRADE PYELOGRAM;  Surgeon: Irine Seal,  MD;  Location: [facility];  Service: Urology;  Laterality: Left;  . FOOT AMPUTATION Bilateral   . HARDWARE REMOVAL Right 07/19/2014   Procedure: Removal Deep Hardware Right Femur;  Surgeon: Newt Minion, MD;  Location: [place];  Service: Orthopedics;  Laterality: Right;  Six screws and one condylar plate removed   . hemodialysis catheter    . INSERTION OF DIALYSIS CATHETER Right 02/07/2017   Procedure: INSERTION OF DIALYSIS CATHETER;  Surgeon: Rosetta Posner, MD;  Location: [place];  Service: Vascular;  Laterality: Right;  . LEFT HEART CATH AND CORONARY ANGIOGRAPHY N/A 04/06/2017   Procedure: LEFT HEART CATH AND CORONARY ANGIOGRAPHY;  Surgeon: Troy Sine, MD;  Location: [place] CV LAB;  Service: Cardiovascular;  Laterality: N/A;  . LEFT HEART CATH AND CORS/GRAFTS ANGIOGRAPHY N/A 02/11/2017   Procedure: LEFT HEART CATH AND CORS/GRAFTS ANGIOGRAPHY;  Surgeon: Nelva Bush, MD;  Location: [place] CV LAB;  Service: Cardiovascular;  Laterality: N/A;  . LOWER EXTREMITY ANGIOGRAM N/A 12/26/2013   Procedure: LOWER EXTREMITY ANGIOGRAM;  Surgeon: Serafina Obryant, MD;  Location[facility] C[facility] LAB;  Service: Cardiovascular;  Laterality: N/A;  . TRANSURETHRAL RESECTION OF BLADDER TUMOR N/A 03/01/2017   Procedure: TRANSURETHRAL RESECTION OF LARGE BLADDER TUMOR (TURBT)FROM BLADDER NECK;  Surgeon: Irine Seal, MD;  Location: [facility];  Service: Urology;  Laterality: N/A;  . UPPER EXTREMITY ANGIOGRAM Right 12/26/2013   Procedure: UPPER EXTREMITY ANGIOGRAM;  Surgeon: Serafina Bynum, MD;  Location: [facility] CATH LAB;  Service: Cardiovascular;  Laterality: Right;   Social History   Socioeconomic History  . Marital status: Divorced    Spouse name: Not on file  . Number of children: Not on file  . Years of education: Not on file  . Highest education level: Not on file  Social Needs  . Financial resource strain: Not on file  . Food insecurity - worry: Not on file  . Food insecurity - inability: Not on file  .  Transportation needs - medical: Not on file  . Transportation needs - non-medical: Not on file  Occupational History  . Not on file  Tobacco Use  . Smoking status: Former Smoker    Years: 30.00    Types: Cigarettes  . Smokeless tobacco: Never Used  . Tobacco comment: quit in 2002  Substance and Sexual Activity  . Alcohol use: No  . Drug use: No  . Sexual activity: Not on file  Other Topics Concern  . Not on file  Social History Narrative   Pt currently living with her brother.   Independent w/  transfers bed>wheelchair     Review of Systems: General: negative for chills, fever, night sweats or weight changes.  Cardiovascular: negative for chest pain, dyspnea on exertion, edema, orthopnea, palpitations, paroxysmal nocturnal dyspnea or shortness of breath Dermatological: negative for rash Respiratory: negative for cough or wheezing Urologic: negative for hematuria Abdominal: negative for nausea, vomiting, diarrhea, bright red blood per rectum, melena, or hematemesis Neurologic: negative for visual changes, syncope, or dizziness All other systems reviewed and are otherwise negative except as noted above.   Physical Exam:  Blood pressure 128/60, pulse 63, resp. rate 16, height 5\' 5"  (1.651 m), weight 117 lb (53.1 kg), SpO2 95 %.  General appearance: alert, cooperative and no distress Neck: no carotid bruit and no JVD Lungs: clear to auscultation bilaterally Heart: regular rate and rhythm, 3/6 SM Extremities: s/p right BKA Pulses: 2+ and symmetric Skin: Skin color, texture, turgor normal. No rashes or lesions Neurologic: Grossly normal  EKG NSR, LVH with QRS widening, 60 bpm -- personally reviewed   ASSESSMENT AND PLAN:   1. CAD: s/p recent NSTEMI 04/04/17. Troponin > 65. Cath showedostial LIMA80% stenosiswith subclavian stent proximal to this and tight diagonal lesions. S/p successful PCI of ostial LIMA. Medical therapy for residual disease. If she fails medical therapy,  plan is to consider return to cath lab for atherectomy of the proximal LAD prior to a large first diagonal takeoff. Luckily, she has had no recurrent anginal symptoms. Continue DAPT w/ ASA and Plavix, BB and statin.   2. Systolic HF: EF 82-50%. Volume controlled through HD. No dyspnea.   3. Aortic Stenosis: mild to moderate. No CP or dyspnea.   4. ESRD: on HD.   5. Bladder CA: Plan for GU resection likely 07/2017.    Follow-Up w/ Dr. Radford Pax in 6-8 weeks or sooner if recurrent symptoms.   Brittainy Ladoris Gene, [facility] 04/18/2017 1:57 PM

## 2017-04-22 ENCOUNTER — Other Ambulatory Visit: Payer: Self-pay | Admitting: Family Medicine

## 2017-05-01 ENCOUNTER — Emergency Department (HOSPITAL_COMMUNITY): Payer: Medicaid Other

## 2017-05-01 ENCOUNTER — Inpatient Hospital Stay (HOSPITAL_COMMUNITY): Payer: Medicaid Other

## 2017-05-01 ENCOUNTER — Inpatient Hospital Stay (HOSPITAL_COMMUNITY)
Admission: EM | Admit: 2017-05-01 | Discharge: 2017-05-06 | DRG: 280 | Disposition: A | Discharge status: Home Health | Payer: Medicaid Other | Attending: Family Medicine | Admitting: Family Medicine

## 2017-05-01 ENCOUNTER — Encounter (HOSPITAL_COMMUNITY): Payer: Self-pay | Admitting: *Deleted

## 2017-05-01 ENCOUNTER — Other Ambulatory Visit: Payer: Self-pay

## 2017-05-01 DIAGNOSIS — E1165 Type 2 diabetes mellitus with hyperglycemia: Secondary | ICD-10-CM | POA: Diagnosis not present

## 2017-05-01 DIAGNOSIS — J810 Acute pulmonary edema: Secondary | ICD-10-CM

## 2017-05-01 DIAGNOSIS — I2510 Atherosclerotic heart disease of native coronary artery without angina pectoris: Secondary | ICD-10-CM | POA: Diagnosis present

## 2017-05-01 DIAGNOSIS — Z86718 Personal history of other venous thrombosis and embolism: Secondary | ICD-10-CM

## 2017-05-01 DIAGNOSIS — I953 Hypotension of hemodialysis: Secondary | ICD-10-CM | POA: Diagnosis not present

## 2017-05-01 DIAGNOSIS — Z888 Allergy status to other drugs, medicaments and biological substances status: Secondary | ICD-10-CM

## 2017-05-01 DIAGNOSIS — Z66 Do not resuscitate: Secondary | ICD-10-CM | POA: Diagnosis not present

## 2017-05-01 DIAGNOSIS — Z955 Presence of coronary angioplasty implant and graft: Secondary | ICD-10-CM

## 2017-05-01 DIAGNOSIS — R54 Age-related physical debility: Secondary | ICD-10-CM | POA: Diagnosis present

## 2017-05-01 DIAGNOSIS — Z515 Encounter for palliative care: Secondary | ICD-10-CM | POA: Diagnosis not present

## 2017-05-01 DIAGNOSIS — E785 Hyperlipidemia, unspecified: Secondary | ICD-10-CM | POA: Diagnosis present

## 2017-05-01 DIAGNOSIS — D631 Anemia in chronic kidney disease: Secondary | ICD-10-CM | POA: Diagnosis present

## 2017-05-01 DIAGNOSIS — C679 Malignant neoplasm of bladder, unspecified: Secondary | ICD-10-CM

## 2017-05-01 DIAGNOSIS — R109 Unspecified abdominal pain: Secondary | ICD-10-CM | POA: Diagnosis present

## 2017-05-01 DIAGNOSIS — Z794 Long term (current) use of insulin: Secondary | ICD-10-CM

## 2017-05-01 DIAGNOSIS — K3184 Gastroparesis: Secondary | ICD-10-CM | POA: Diagnosis present

## 2017-05-01 DIAGNOSIS — Z79890 Hormone replacement therapy (postmenopausal): Secondary | ICD-10-CM

## 2017-05-01 DIAGNOSIS — J9601 Acute respiratory failure with hypoxia: Secondary | ICD-10-CM | POA: Diagnosis present

## 2017-05-01 DIAGNOSIS — I252 Old myocardial infarction: Secondary | ICD-10-CM | POA: Diagnosis not present

## 2017-05-01 DIAGNOSIS — Z79899 Other long term (current) drug therapy: Secondary | ICD-10-CM

## 2017-05-01 DIAGNOSIS — E1143 Type 2 diabetes mellitus with diabetic autonomic (poly)neuropathy: Secondary | ICD-10-CM | POA: Diagnosis present

## 2017-05-01 DIAGNOSIS — E1122 Type 2 diabetes mellitus with diabetic chronic kidney disease: Secondary | ICD-10-CM | POA: Diagnosis present

## 2017-05-01 DIAGNOSIS — N186 End stage renal disease: Secondary | ICD-10-CM | POA: Diagnosis present

## 2017-05-01 DIAGNOSIS — Z89511 Acquired absence of right leg below knee: Secondary | ICD-10-CM

## 2017-05-01 DIAGNOSIS — Z951 Presence of aortocoronary bypass graft: Secondary | ICD-10-CM

## 2017-05-01 DIAGNOSIS — Z992 Dependence on renal dialysis: Secondary | ICD-10-CM | POA: Diagnosis not present

## 2017-05-01 DIAGNOSIS — Z8673 Personal history of transient ischemic attack (TIA), and cerebral infarction without residual deficits: Secondary | ICD-10-CM

## 2017-05-01 DIAGNOSIS — I10 Essential (primary) hypertension: Secondary | ICD-10-CM | POA: Diagnosis not present

## 2017-05-01 DIAGNOSIS — E039 Hypothyroidism, unspecified: Secondary | ICD-10-CM | POA: Diagnosis present

## 2017-05-01 DIAGNOSIS — I132 Hypertensive heart and chronic kidney disease with heart failure and with stage 5 chronic kidney disease, or end stage renal disease: Secondary | ICD-10-CM | POA: Diagnosis present

## 2017-05-01 DIAGNOSIS — Z7189 Other specified counseling: Secondary | ICD-10-CM | POA: Diagnosis not present

## 2017-05-01 DIAGNOSIS — R0902 Hypoxemia: Secondary | ICD-10-CM | POA: Diagnosis present

## 2017-05-01 DIAGNOSIS — R1084 Generalized abdominal pain: Secondary | ICD-10-CM

## 2017-05-01 DIAGNOSIS — K219 Gastro-esophageal reflux disease without esophagitis: Secondary | ICD-10-CM | POA: Diagnosis present

## 2017-05-01 DIAGNOSIS — R112 Nausea with vomiting, unspecified: Secondary | ICD-10-CM

## 2017-05-01 DIAGNOSIS — Z885 Allergy status to narcotic agent status: Secondary | ICD-10-CM

## 2017-05-01 DIAGNOSIS — Z7902 Long term (current) use of antithrombotics/antiplatelets: Secondary | ICD-10-CM

## 2017-05-01 DIAGNOSIS — N390 Urinary tract infection, site not specified: Secondary | ICD-10-CM | POA: Diagnosis present

## 2017-05-01 DIAGNOSIS — T83511A Infection and inflammatory reaction due to indwelling urethral catheter, initial encounter: Secondary | ICD-10-CM | POA: Diagnosis present

## 2017-05-01 DIAGNOSIS — E8889 Other specified metabolic disorders: Secondary | ICD-10-CM | POA: Diagnosis present

## 2017-05-01 DIAGNOSIS — Y846 Urinary catheterization as the cause of abnormal reaction of the patient, or of later complication, without mention of misadventure at the time of the procedure: Secondary | ICD-10-CM | POA: Diagnosis present

## 2017-05-01 DIAGNOSIS — E1129 Type 2 diabetes mellitus with other diabetic kidney complication: Secondary | ICD-10-CM | POA: Diagnosis not present

## 2017-05-01 DIAGNOSIS — N2581 Secondary hyperparathyroidism of renal origin: Secondary | ICD-10-CM | POA: Diagnosis present

## 2017-05-01 DIAGNOSIS — R4182 Altered mental status, unspecified: Secondary | ICD-10-CM

## 2017-05-01 DIAGNOSIS — I161 Hypertensive emergency: Secondary | ICD-10-CM | POA: Diagnosis present

## 2017-05-01 DIAGNOSIS — I350 Nonrheumatic aortic (valve) stenosis: Secondary | ICD-10-CM | POA: Diagnosis present

## 2017-05-01 DIAGNOSIS — Z87891 Personal history of nicotine dependence: Secondary | ICD-10-CM

## 2017-05-01 DIAGNOSIS — Z452 Encounter for adjustment and management of vascular access device: Secondary | ICD-10-CM

## 2017-05-01 DIAGNOSIS — I5020 Unspecified systolic (congestive) heart failure: Secondary | ICD-10-CM | POA: Diagnosis present

## 2017-05-01 DIAGNOSIS — R1013 Epigastric pain: Secondary | ICD-10-CM | POA: Diagnosis not present

## 2017-05-01 DIAGNOSIS — I214 Non-ST elevation (NSTEMI) myocardial infarction: Secondary | ICD-10-CM | POA: Diagnosis present

## 2017-05-01 DIAGNOSIS — Z7982 Long term (current) use of aspirin: Secondary | ICD-10-CM

## 2017-05-01 DIAGNOSIS — Z8551 Personal history of malignant neoplasm of bladder: Secondary | ICD-10-CM

## 2017-05-01 DIAGNOSIS — Z89611 Acquired absence of right leg above knee: Secondary | ICD-10-CM

## 2017-05-01 HISTORY — DX: Insomnia, unspecified: G47.00

## 2017-05-01 HISTORY — DX: Neoplasm of unspecified behavior of bladder: D49.4

## 2017-05-01 HISTORY — DX: Type 2 diabetes mellitus with other diabetic kidney complication: E11.29

## 2017-05-01 HISTORY — DX: Type 2 diabetes mellitus with diabetic peripheral angiopathy with gangrene: E11.52

## 2017-05-01 HISTORY — DX: Other forms of angina pectoris: I20.89

## 2017-05-01 HISTORY — DX: Type 2 diabetes mellitus with hyperglycemia: E11.65

## 2017-05-01 HISTORY — DX: Thromboangiitis obliterans (Buerger's disease): I73.1

## 2017-05-01 HISTORY — DX: End stage renal disease: N18.6

## 2017-05-01 HISTORY — DX: Unspecified conjunctivitis: H10.9

## 2017-05-01 HISTORY — DX: Presence of other vascular implants and grafts: Z99.2

## 2017-05-01 HISTORY — DX: Palmar fascial fibromatosis (dupuytren): M72.0

## 2017-05-01 HISTORY — DX: Dependence on renal dialysis: Z95.828

## 2017-05-01 HISTORY — DX: Other forms of angina pectoris: I20.8

## 2017-05-01 HISTORY — DX: Acquired absence of right leg above knee: Z89.611

## 2017-05-01 HISTORY — DX: Non-pressure chronic ulcer of other part of right foot with necrosis of bone: L97.514

## 2017-05-01 LAB — COMPREHENSIVE METABOLIC PANEL
ALK PHOS: 113 U/L (ref 38–126)
ALT: 11 U/L — AB (ref 14–54)
AST: 20 U/L (ref 15–41)
Albumin: 3.5 g/dL (ref 3.5–5.0)
Anion gap: 18 — ABNORMAL HIGH (ref 5–15)
BILIRUBIN TOTAL: 0.6 mg/dL (ref 0.3–1.2)
BUN: 38 mg/dL — AB (ref 6–20)
CALCIUM: 9.5 mg/dL (ref 8.9–10.3)
CO2: 23 mmol/L (ref 22–32)
CREATININE: 5.86 mg/dL — AB (ref 0.44–1.00)
Chloride: 97 mmol/L — ABNORMAL LOW (ref 101–111)
GFR, EST AFRICAN AMERICAN: 8 mL/min — AB (ref >60)
GFR, EST NON AFRICAN AMERICAN: 7 mL/min — AB (ref >60)
Glucose, Bld: 267 mg/dL — ABNORMAL HIGH (ref 65–99)
Potassium: 3.7 mmol/L (ref 3.5–5.1)
Sodium: 138 mmol/L (ref 135–145)
Total Protein: 7.9 g/dL (ref 6.5–8.1)

## 2017-05-01 LAB — LIPASE, BLOOD: LIPASE: 137 U/L — AB (ref 11–51)

## 2017-05-01 LAB — TROPONIN I
TROPONIN I: 0.43 ng/mL — AB (ref <0.03)
TROPONIN I: 4.52 ng/mL — AB (ref <0.03)

## 2017-05-01 LAB — CBC WITH DIFFERENTIAL/PLATELET
Basophils Absolute: 0 10*3/uL (ref 0.0–0.1)
Basophils Relative: 0 %
EOS PCT: 2 %
Eosinophils Absolute: 0.2 10*3/uL (ref 0.0–0.7)
HEMATOCRIT: 36.9 % (ref 36.0–46.0)
HEMOGLOBIN: 11.8 g/dL — AB (ref 12.0–15.0)
LYMPHS PCT: 7 %
Lymphs Abs: 0.6 10*3/uL — ABNORMAL LOW (ref 0.7–4.0)
MCH: 31.7 pg (ref 26.0–34.0)
MCHC: 32 g/dL (ref 30.0–36.0)
MCV: 99.2 fL (ref 78.0–100.0)
Monocytes Absolute: 0.6 10*3/uL (ref 0.1–1.0)
Monocytes Relative: 6 %
NEUTROS ABS: 7.6 10*3/uL (ref 1.7–7.7)
NEUTROS PCT: 85 %
Platelets: 173 10*3/uL (ref 150–400)
RBC: 3.72 MIL/uL — AB (ref 3.87–5.11)
RDW: 17.8 % — ABNORMAL HIGH (ref 11.5–15.5)
WBC: 9 10*3/uL (ref 4.0–10.5)

## 2017-05-01 LAB — HEMOGLOBIN A1C
Hgb A1c MFr Bld: 6.2 % — ABNORMAL HIGH (ref 4.8–5.6)
Mean Plasma Glucose: 131.24 mg/dL

## 2017-05-01 LAB — PROTIME-INR
INR: 0.98
Prothrombin Time: 12.9 seconds (ref 11.4–15.2)

## 2017-05-01 LAB — CK TOTAL AND CKMB (NOT AT ARMC)
CK, MB: 7.8 ng/mL — ABNORMAL HIGH (ref 0.5–5.0)
Relative Index: INVALID (ref 0.0–2.5)
Total CK: 85 U/L (ref 38–234)

## 2017-05-01 LAB — GLUCOSE, CAPILLARY
Glucose-Capillary: 192 mg/dL — ABNORMAL HIGH (ref 65–99)
Glucose-Capillary: 216 mg/dL — ABNORMAL HIGH (ref 65–99)

## 2017-05-01 LAB — TSH: TSH: 9.707 u[IU]/mL — ABNORMAL HIGH (ref 0.350–4.500)

## 2017-05-01 MED ORDER — PIPERACILLIN-TAZOBACTAM 3.375 G IVPB
3.3750 g | Freq: Two times a day (BID) | INTRAVENOUS | Status: DC
  Administered 2017-05-01 – 2017-05-03 (×4): 3.375 g via INTRAVENOUS
  Filled 2017-05-01 (×4): qty 50

## 2017-05-01 MED ORDER — LEVOTHYROXINE SODIUM 25 MCG PO TABS
25.0000 ug | ORAL_TABLET | Freq: Every day | ORAL | Status: DC
  Administered 2017-05-02 – 2017-05-06 (×5): 25 ug via ORAL
  Filled 2017-05-01 (×5): qty 1

## 2017-05-01 MED ORDER — ASPIRIN 300 MG RE SUPP: 300.0000 mg | RECTAL | Status: DC

## 2017-05-01 MED ORDER — METOPROLOL TARTRATE 25 MG PO TABS
25.0000 mg | ORAL_TABLET | Freq: Two times a day (BID) | ORAL | Status: DC
  Administered 2017-05-01: 25 mg via ORAL
  Filled 2017-05-01: qty 1

## 2017-05-01 MED ORDER — PROMETHAZINE HCL 12.5 MG RE SUPP
12.5000 mg | Freq: Three times a day (TID) | RECTAL | Status: AC
  Filled 2017-05-01: qty 1

## 2017-05-01 MED ORDER — VITAMIN B-1 100 MG PO TABS
100.0000 mg | ORAL_TABLET | Freq: Every day | ORAL | Status: DC
  Administered 2017-05-02 – 2017-05-06 (×5): 100 mg via ORAL
  Filled 2017-05-01 (×5): qty 1

## 2017-05-01 MED ORDER — METOCLOPRAMIDE HCL 5 MG/ML IJ SOLN: 5.0000 mg | Freq: Two times a day (BID) | INTRAMUSCULAR | Status: DC | PRN

## 2017-05-01 MED ORDER — ONDANSETRON 4 MG PO TBDP
8.0000 mg | ORAL_TABLET | Freq: Three times a day (TID) | ORAL | Status: DC
  Administered 2017-05-01 – 2017-05-02 (×2): 8 mg via ORAL
  Filled 2017-05-01 (×2): qty 2

## 2017-05-01 MED ORDER — PROMETHAZINE HCL 25 MG/ML IJ SOLN
12.5000 mg | Freq: Once | INTRAMUSCULAR | Status: AC
  Administered 2017-05-01: 12.5 mg via INTRAVENOUS

## 2017-05-01 MED ORDER — SODIUM CHLORIDE 0.9 % IV BOLUS (SEPSIS)
1000.0000 mL | Freq: Once | INTRAVENOUS | Status: AC
  Administered 2017-05-02: 1000 mL via INTRAVENOUS

## 2017-05-01 MED ORDER — ONDANSETRON HCL 4 MG/2ML IJ SOLN
4.0000 mg | Freq: Once | INTRAMUSCULAR | Status: DC
  Filled 2017-05-01: qty 2

## 2017-05-01 MED ORDER — ASPIRIN EC 81 MG PO TBEC
81.0000 mg | DELAYED_RELEASE_TABLET | Freq: Every day | ORAL | Status: DC
  Administered 2017-05-02 – 2017-05-06 (×5): 81 mg via ORAL
  Filled 2017-05-01 (×5): qty 1

## 2017-05-01 MED ORDER — SODIUM CHLORIDE 0.9 % IV SOLN: INTRAVENOUS | Status: DC

## 2017-05-01 MED ORDER — LANTHANUM CARBONATE 500 MG PO CHEW
1000.0000 mg | CHEWABLE_TABLET | Freq: Three times a day (TID) | ORAL | Status: DC
  Administered 2017-05-02 – 2017-05-05 (×5): 1000 mg via ORAL
  Filled 2017-05-01 (×8): qty 2

## 2017-05-01 MED ORDER — POLYETHYLENE GLYCOL 3350 17 G PO PACK: 17.0000 g | PACK | Freq: Every day | ORAL | Status: DC | PRN

## 2017-05-01 MED ORDER — IOPAMIDOL (ISOVUE-370) INJECTION 76%
INTRAVENOUS | Status: AC
  Administered 2017-05-01: 100 mL
  Filled 2017-05-01: qty 100

## 2017-05-01 MED ORDER — INSULIN ASPART 100 UNIT/ML ~~LOC~~ SOLN: 10.0000 [IU] | Freq: Once | SUBCUTANEOUS | Status: DC

## 2017-05-01 MED ORDER — HEPARIN SODIUM (PORCINE) 5000 UNIT/ML IJ SOLN
5000.0000 [IU] | Freq: Three times a day (TID) | INTRAMUSCULAR | Status: DC
  Filled 2017-05-01: qty 1

## 2017-05-01 MED ORDER — METOCLOPRAMIDE HCL 5 MG PO TABS: 5.0000 mg | ORAL_TABLET | Freq: Two times a day (BID) | ORAL | Status: DC | PRN

## 2017-05-01 MED ORDER — INSULIN ASPART 100 UNIT/ML ~~LOC~~ SOLN
0.0000 [IU] | SUBCUTANEOUS | Status: DC
  Administered 2017-05-01: 3 [IU] via SUBCUTANEOUS
  Administered 2017-05-02: 1 [IU] via SUBCUTANEOUS
  Administered 2017-05-02: 3 [IU] via SUBCUTANEOUS
  Administered 2017-05-03 (×2): 2 [IU] via SUBCUTANEOUS
  Administered 2017-05-03: 1 [IU] via SUBCUTANEOUS
  Administered 2017-05-03: 2 [IU] via SUBCUTANEOUS
  Administered 2017-05-04: 1 [IU] via SUBCUTANEOUS
  Administered 2017-05-04: 5 [IU] via SUBCUTANEOUS
  Administered 2017-05-04 (×2): 1 [IU] via SUBCUTANEOUS
  Administered 2017-05-05: 5 [IU] via SUBCUTANEOUS
  Administered 2017-05-05: 1 [IU] via SUBCUTANEOUS
  Administered 2017-05-05 – 2017-05-06 (×3): 2 [IU] via SUBCUTANEOUS

## 2017-05-01 MED ORDER — OXYCODONE HCL 5 MG PO TABS
5.0000 mg | ORAL_TABLET | ORAL | Status: DC | PRN
  Administered 2017-05-01 – 2017-05-03 (×6): 5 mg via ORAL
  Filled 2017-05-01 (×6): qty 1

## 2017-05-01 MED ORDER — CLOPIDOGREL BISULFATE 75 MG PO TABS
75.0000 mg | ORAL_TABLET | Freq: Every day | ORAL | Status: DC
  Administered 2017-05-02 – 2017-05-06 (×5): 75 mg via ORAL
  Filled 2017-05-01 (×5): qty 1

## 2017-05-01 MED ORDER — PROMETHAZINE HCL 25 MG/ML IJ SOLN
12.5000 mg | Freq: Four times a day (QID) | INTRAMUSCULAR | Status: DC | PRN
  Filled 2017-05-01: qty 1

## 2017-05-01 MED ORDER — ATORVASTATIN CALCIUM 40 MG PO TABS
40.0000 mg | ORAL_TABLET | Freq: Every day | ORAL | Status: DC
  Administered 2017-05-01 – 2017-05-05 (×5): 40 mg via ORAL
  Filled 2017-05-01 (×5): qty 1

## 2017-05-01 MED ORDER — SENNOSIDES-DOCUSATE SODIUM 8.6-50 MG PO TABS: 1.0000 | ORAL_TABLET | Freq: Every evening | ORAL | Status: DC | PRN

## 2017-05-01 MED ORDER — "THROMBI-PAD 3""X3"" EX PADS": 1.0000 | MEDICATED_PAD | Freq: Once | CUTANEOUS | Status: DC

## 2017-05-01 MED ORDER — AMLODIPINE BESYLATE 10 MG PO TABS: 10.0000 mg | ORAL_TABLET | Freq: Every day | ORAL | Status: DC

## 2017-05-01 MED ORDER — ASPIRIN 81 MG PO CHEW: 81.0000 mg | CHEWABLE_TABLET | Freq: Every day | ORAL | Status: DC

## 2017-05-01 NOTE — Progress Notes (Signed)
Troponin 4.52. MD notified.

## 2017-05-01 NOTE — Consult Note (Signed)
[facility] Renal Consultation Note    Indication for Consultation:  Management of ESRD/hemodialysis, anemia, hypertension/volume, and secondary hyperparathyroidism. PCP:  HPI: Brandi Arellano is a 62 y.o. female with ESRD, CAD (Hx CABG, s/p recent NSTEMI/stenting), Type 2 DM, Hx DVT, PAD (Hx R AKA), Hx CVA, hypothyroidism, and bladder cancer (awaiting surgical treatment) who was admitted with N/V and CP/dyspnea.  Brought in by EMS with abdominal pain and N/V since last night. She felt symptoms were similar to her presentation with NSTEMI last month. Denies diarrhea, fever or chills. Afebrile, but hypertensive. ED work-up so far showed Na 138, K 3.7, Glu 267, Lipase 137, trop 0.43, WBC 9, Hgb 11.8, Plt 173. CXR with + interstitial edema present. EKG without ST elevation. CT angio of chest/abdomen ordered, pending.   Looks miserable at this time, dry heaving into bag. Denies CP, but + epigastric pains and SOB.   Dialyzes MWF at [facility]. Last HD 1/18. Completed 3:35hr of 4hr treatment, got very close to EDW. Currently using TDC without issues.   Past Medical History:  Diagnosis Date  . Anemia   . Bladder mass 02/09/2017  . CAD (coronary artery disease)    CABG  . CHF (congestive heart failure) (HCC)    Acute on chronic diastolic  . Complication of anesthesia   . Diabetes mellitus without complication (HCC)    Type II  . DVT (deep venous thrombosis) ([place])   . ESRD (end stage renal disease) ([place])    HD M-W-F  . Gangrene of lower extremity (HCC)    RLE  . GERD (gastroesophageal reflux disease)   . Hx of AKA (above knee amputation), right ([place])   . Hyperlipidemia   . Hypertension   . Hypothyroidism   . PONV (postoperative nausea and vomiting)   . Stroke [facility])    "light stroke" no deficits  . Thrombocytopenia ([place])   . Thyroid disease    Abnormal Thyroid function Test  . UTI (lower urinary tract infection)    Past Surgical History:  Procedure Laterality  Date  . ABDOMINAL ANGIOGRAM  12/26/2013   Procedure: ABDOMINAL ANGIOGRAM;  Surgeon: Serafina Olivera, MD;  Location: [facility] CATH LAB;  Service: Cardiovascular;;  . AMPUTATION Right 02/01/2014   Procedure: Right Below Knee Amputation;  Surgeon: Newt Minion, MD;  Location: [place];  Service: Orthopedics;  Laterality: Right;  . AMPUTATION Right 03/24/2014   Procedure: AMPUTATION ABOVE KNEE;  Surgeon: Newt Minion, MD;  Location: [place];  Service: Orthopedics;  Laterality: Right;  . ARCH AORTOGRAM  12/26/2013   Procedure: ARCH AORTOGRAM;  Surgeon: Serafina Whitcher, MD;  Location: [facility] CATH LAB;  Service: Cardiovascular;;  . AXILLARY ARTERY - BRACHIAL ARTERY BYPASS GRAFT  11/07/2013   At [place]     Bilateral  . CHOLECYSTECTOMY    . CORONARY ARTERY BYPASS GRAFT  2012   in [place], [place] N/A 04/07/2017   Procedure: CORONARY STENT INTERVENTION;  Surgeon: Troy Sine, MD;  Location: [place] CV LAB;  Service: Cardiovascular;  Laterality: N/A;  . CYSTOSCOPY W/ RETROGRADES Left 03/01/2017   Procedure: CYSTOSCOPY WITH RETROGRADE PYELOGRAM;  Surgeon: Irine Seal, MD;  Location: [facility];  Service: Urology;  Laterality: Left;  . FOOT AMPUTATION Bilateral   . HARDWARE REMOVAL Right 07/19/2014   Procedure: Removal Deep Hardware Right Femur;  Surgeon: Newt Minion, MD;  Location: [place];  Service: Orthopedics;  Laterality: Right;  Six screws  and one condylar plate removed   . hemodialysis catheter    . INSERTION OF DIALYSIS CATHETER Right 02/07/2017   Procedure: INSERTION OF DIALYSIS CATHETER;  Surgeon: Rosetta Posner, MD;  Location: [place];  Service: Vascular;  Laterality: Right;  . LEFT HEART CATH AND CORONARY ANGIOGRAPHY N/A 04/06/2017   Procedure: LEFT HEART CATH AND CORONARY ANGIOGRAPHY;  Surgeon: Troy Sine, MD;  Location: [place] CV LAB;  Service: Cardiovascular;  Laterality: N/A;  . LEFT HEART CATH AND CORS/GRAFTS ANGIOGRAPHY N/A  02/11/2017   Procedure: LEFT HEART CATH AND CORS/GRAFTS ANGIOGRAPHY;  Surgeon: Nelva Bush, MD;  Location: [place] CV LAB;  Service: Cardiovascular;  Laterality: N/A;  . LOWER EXTREMITY ANGIOGRAM N/A 12/26/2013   Procedure: LOWER EXTREMITY ANGIOGRAM;  Surgeon: Serafina Reifsteck, MD;  Location: [facility] CATH LAB;  Service: Cardiovascular;  Laterality: N/A;  . TRANSURETHRAL RESECTION OF BLADDER TUMOR N/A 03/01/2017   Procedure: TRANSURETHRAL RESECTION OF LARGE BLADDER TUMOR (TURBT)FROM BLADDER NECK;  Surgeon: Irine Seal, MD;  Location: [facility];  Service: Urology;  Laterality: N/A;  . UPPER EXTREMITY ANGIOGRAM Right 12/26/2013   Procedure: UPPER EXTREMITY ANGIOGRAM;  Surgeon: Serafina Isaacson, MD;  Location[facility] CATH LAB;  Service: Cardiovascular;  Laterality: Right;   Family History  Adopted: Yes  Problem Relation Age of Onset  . Hypertension Father   . Heart disease Father        Coronary Artery Disease   Social History:  reports that she has quit smoking. Her smoking use included cigarettes. She quit after 30.00 years of use. she has never used smokeless tobacco. She reports that she does not drink alcohol or use drugs.  ROS: As per HPI otherwise negative.  Physical Exam: Vitals:   05/01/17 0800 05/01/17 0815 05/01/17 0930 05/01/17 0945  BP: (!) 200/108 (!) 193/88 (!) 180/89   Pulse: (!) 117 (!) 101  (!) 101  Resp: 14 19  (!) 24  Temp:      SpO2: 97% 97%  93%  Weight:      Height:         General: Ill appearing female, dry heaving into bag.  Head: Normocephalic, atraumatic Neck: Supple without lymphadenopathy/masses. Lungs: Bibasilar rales. Heart: RRR; 3/6 murmur Abdomen: Soft, mild tenderness. Unable to fully examine due to vomiting. Musculoskeletal:  Strength and tone appear normal for age. Lower extremities: No LE edema. Neuro: Alert and oriented X 3. Moves all extremities spontaneously. Psych:  Responds to questions appropriately. Dialysis Access: TDC in R chest without  erythema or tenderness  Allergies  Allergen Reactions  . Diphenhydramine Nausea And Vomiting  . Tramadol Hcl Nausea And Vomiting  . Morphine And Related Nausea And Vomiting   Prior to Admission medications   Medication Sig Start Date End Date Taking? Authorizing Provider  amLODipine (NORVASC) 10 MG tablet Take 10 mg by mouth daily with breakfast.   Yes [provider]  aspirin EC 81 MG tablet Take 81 mg by mouth daily with breakfast.    Yes [provider]  atorvastatin (LIPITOR) 40 MG tablet Take 40 mg by mouth at bedtime.    Yes [provider]  clopidogrel (PLAVIX) 75 MG tablet Take 1 tablet (75 mg total) by mouth daily with breakfast. 04/12/17  Yes [place] Bing, DO  lanthanum (FOSRENOL) 1000 MG chewable tablet Chew 1,000 mg by mouth See admin instructions. Crush and sprinkle over food one tablet (1000 mg) three times daily with meals   Yes [provider]  levothyroxine (  SYNTHROID, LEVOTHROID) 25 MCG tablet Take 25 mcg by mouth daily with breakfast.    Yes [provider]  metoprolol tartrate (LOPRESSOR) 25 MG tablet Take 1 tablet (25 mg total) by mouth 2 (two) times daily. 03/15/17  Yes [place] Derry, [place], DO  senna-docusate (SENOKOT-S) 8.6-50 MG tablet Take 1 tablet by mouth at bedtime as needed for mild constipation. 03/15/17  Yes [place] Derry, [place], DO  thiamine 100 MG tablet Take 1 tablet (100 mg total) by mouth daily. Patient taking differently: Take 100 mg by mouth daily with breakfast.  03/16/17  Yes [place] Derry, [place], DO  citalopram (CELEXA) 20 MG tablet Take 1 tablet (20 mg total) by mouth daily. Patient not taking: Reported on 05/01/2017 04/12/17   [place] Bing, DO   Current Facility-Administered Medications  Medication Dose Route Frequency Provider Last Rate Last Dose  . ondansetron (ZOFRAN) injection 4 mg  4 mg Intravenous Once Rancour, Annie Main, MD       Current Outpatient Medications  Medication Sig Dispense Refill  . amLODipine  (NORVASC) 10 MG tablet Take 10 mg by mouth daily with breakfast.    . aspirin EC 81 MG tablet Take 81 mg by mouth daily with breakfast.     . atorvastatin (LIPITOR) 40 MG tablet Take 40 mg by mouth at bedtime.     . clopidogrel (PLAVIX) 75 MG tablet Take 1 tablet (75 mg total) by mouth daily with breakfast. 30 tablet 0  . lanthanum (FOSRENOL) 1000 MG chewable tablet Chew 1,000 mg by mouth See admin instructions. Crush and sprinkle over food one tablet (1000 mg) three times daily with meals    . levothyroxine (SYNTHROID, LEVOTHROID) 25 MCG tablet Take 25 mcg by mouth daily with breakfast.     . metoprolol tartrate (LOPRESSOR) 25 MG tablet Take 1 tablet (25 mg total) by mouth 2 (two) times daily. 60 tablet 0  . senna-docusate (SENOKOT-S) 8.6-50 MG tablet Take 1 tablet by mouth at bedtime as needed for mild constipation. 30 tablet 0  . thiamine 100 MG tablet Take 1 tablet (100 mg total) by mouth daily. (Patient taking differently: Take 100 mg by mouth daily with breakfast. ) 30 tablet 0  . citalopram (CELEXA) 20 MG tablet Take 1 tablet (20 mg total) by mouth daily. (Patient not taking: Reported on 05/01/2017) 30 tablet 0   Facility-Administered Medications Ordered in Other Encounters  Medication Dose Route Frequency Provider Last Rate Last Dose  . 0.9 %  sodium chloride infusion    Continuous PRN Laretta Alstrom, CRNA       Labs: Basic Metabolic Panel: Recent Labs  Lab 05/01/17 0640  NA 138  K 3.7  CL 97*  CO2 23  GLUCOSE 267*  BUN 38*  CREATININE 5.86*  CALCIUM 9.5   Liver Function Tests: Recent Labs  Lab 05/01/17 0640  AST 20  ALT 11*  ALKPHOS 113  BILITOT 0.6  PROT 7.9  ALBUMIN 3.5   Recent Labs  Lab 05/01/17 0640  LIPASE 137*   CBC: Recent Labs  Lab 05/01/17 0640  WBC 9.0  NEUTROABS 7.6  HGB 11.8*  HCT 36.9  MCV 99.2  PLT 173   Cardiac Enzymes: Recent Labs  Lab 05/01/17 0640  TROPONINI 0.43*   Studies/Results: Dg Chest Portable 1 View  Result Date:  05/01/2017 CLINICAL DATA:  Chest pain EXAM: PORTABLE CHEST 1 VIEW COMPARISON:  April 05, 2017 FINDINGS: Central catheter tip is in the right atrium slightly beyond the cavoatrial junction. No pneumothorax. There is interstitial  pulmonary edema with a small right pleural effusion. There is cardiomegaly with pulmonary venous hypertension. There is aortic atherosclerosis. Patient is status post median sternotomy. No evident adenopathy. There is postoperative change in each lower neck region. There is extensive carotid and axillary artery calcification. Temporary pacemaker wires are attached to the right heart. IMPRESSION: Interstitial pulmonary edema with pulmonary vascular congestion and small right pleural effusion. The appearance is felt to be indicative of congestive heart failure. No airspace consolidation. Central catheter as described without pneumothorax. Temporary pacemaker wires attached to right heart. There is extensive vascular calcification with aortic atherosclerosis as well as extensive bilateral carotid and axillary artery calcification. Aortic Atherosclerosis (ICD10-I70.0). Electronically Signed   By: Lowella Grip III M.D.   On: 05/01/2017 07:11    Dialysis Orders: MWF at [facility] 4hr, 400/A1.5, EDW 54kg, 2K/2.25Ca, Linear Na/UF #4, TDC, heparin 4000 bolus - Venofer 50mg  IV weekly - Hectoral 101mcg IV q HD - Mircera 252mcg q 2 weeks  Assessment/Plan: 1.  N/V: Per primary. Follow CTA results. ?pancreatitis. Would check blood and urine Cx.  2.  Pulmonary edema: Diffuse on CXR. Will plan on HD this afternoon to correct. 3.  ESRD: Usually MWF schedule. + volume overload, HD today as above. 4.  Hypertension/volume: BP high. UF as tolerated, will get her below EDW. 5.  Anemia: Hgb 11.8. No ESA for now. 6.  Metabolic bone disease: Ca ok, will resume binders once able to eat. 7. Type 2 DM 8. Bladder Cancer 9. CAD (Hx CABG, Hx recent NSTEMI with stenting): 1st trop 0.43, much lower than  last presentation. EKG without contiguous lead ST depression/elevation. Per primary.   Veneta Penton, PA-C 05/01/2017, 12:24 PM  [facility] Pager: [phone]

## 2017-05-01 NOTE — Progress Notes (Signed)
I have interviewed and examined the patient.  I have discussed the case and verified the key findings with Dr. Shan Levans.   I agree with their assessments and plans as documented in their admission note.   Principal Problem:   Abdominal pain, diffuse - Acute onset last night with associated nausea and vomiting.    Relevant history:  recent NSTEMI with Stent of LIMA and PCI Rotablator of diagonal with recommendation on 03/18/17 by Dr Claiborne Billings (Card) Dr Claiborne Billings recommended arthrectomy of proximal LAD just before takeoff of first Diagonal is symptoms persisted.,  - diabetic gastroparesis,  - Invasive bladder carcinoma,  - recent CAUTI (Enterococcus faecalis), and  -  ESRD - HD - Labs: Urinalysis (Catheter collection without change in indwelling catheter) WBC TNTC, Lipase 137 (has been 295 and 116 in last 3 months), Troponin 0.47 (20.08 on 04/07/17) - EKG: Concerning for possible ST depression downward sloping in V5 and V6, and possibly in inferior leads changes on 1/20 EKG compared to last EKG 04/18/16 at [facility] outpatient office visit.   Active Problems:   Anemia due to chronic kidney disease, on chronic dialysis (HCC)   Gastroparesis due to DM (HCC)   Altered mental status   ESRD on hemodialysis ([place])   Urinary tract infection associated with indwelling urethral catheter ([place])   Type 2 diabetes mellitus with chronic kidney disease on chronic dialysis, with long-term current use of insulin (HCC)   Bladder carcinoma (HCC)   NSTEMI (non-ST elevated myocardial infarction) ([place])   Hypertension   Hypothyroidism   HLD (hyperlipidemia)   Hypoxia   Frailty  Recommend - Start Zosyn for CAUTI with hx of enterococcus - Serial Troponin, will check CK/MB to distinguish between declining Toponin long tail and new AMI.  - Consult cardiology [facility]) for concern for ACS given elevated Troponin I and change in ST apparent changes in ST segments since 04/18/16 [facility] cardiology office EKG. - Greatly  Appreciate Critical Cares Assistance in helping obtain IV access for this patient who is unable to keep down PO medications and needs IV meds.  - Change foley and recollect urine for culture. - Consult with Nephrology for possible HD.

## 2017-05-01 NOTE — ED Triage Notes (Signed)
The pt arrived by [facility] from home where she has been having abd pain with n anv no diarrhea   Dialysis pt that was dialyzed  Friday dialysis catheter in her rt upper chest  Cardiac event  RecenT ??

## 2017-05-01 NOTE — ED Notes (Signed)
The pt keeps removing her venturi mask and O2 sats drop. Informed to keep it on. Pt still in pain to abdomen. Denies chest pain. MD winfrey aware pt remains without any IV/Central access. Pt will go for dialysis today.

## 2017-05-01 NOTE — ED Notes (Signed)
IV team was unable to place IV X 2 person attempt. MD Ayesha Rumpf informed. Set up for EJ placement. Pt also was desatting to 78% on 8L O2 via nasal cannula. Placed on a venturi mask at 55% on 14L, O2 sats now maintaining at 93-98%

## 2017-05-01 NOTE — ED Notes (Signed)
Pt returned from CT. Tech stated IV infiltrated. Admitting at bedside, IV team consult placed.

## 2017-05-01 NOTE — ED Notes (Signed)
Attempted report 

## 2017-05-01 NOTE — Consult Note (Signed)
Cardiology Consultation:   Patient ID: Brandi Arellano; [id number]; 09-17-1955   Admit date: 05/01/2017 Date of Consult: 05/01/2017  Primary Care Provider: Kerin Perna, NP Primary Cardiologist: Dr. Radford Pax Primary Electrophysiologist: NA  Patient Profile:   Brandi Arellano is a 62 y.o. female with a hx of remote CABG, CAD with NSTEMI on 04/07/17 s/p PTCA and DES to ostial LIMA graft with recommendationS for medical therapy for residual disease, borderline HFpEF w/ EF 40-45%, ESRD on HD, PVDs/p right BKA, HTN, HLD, DMand recent diagnosis of bladder cancer, s/p surgical resectionof tumor who is being seen today for the evaluation of chest pain and elevated troponin level at the request of Dr. Shawna Orleans.   Of note, per Dr. Claiborne Billings post cath note, if the pt continues to experience symptoms, will consider arthrectomy of the proximal LAD prior to a large first diagonal takeoff. echo showed mildly reduced EF at 40-45%. She is on DAPT with ASA and Plavix, high intensity statin, metoprolol and amlodipine.   History of Present Illness:   Brandi Arellano is a 62 yo female who presents from home with diffuse abdominal pain with associated nausea and vomiting since last night. She reports three episodes of emesis. She states that this is similar to how she felt during her last hospitalization for NSTEMI one month ago and that she has not felt "like herself" or at baseline since her last hospitalization. She reports normal dialysis yesterday (04/30/17). Her normal HD day are MWF. She reports compliance with her medications.   In the ED today 05/01/17, her troponin levels are elevated at 0.43, her EKG ST-T abnormalities in leads V5-V6 similar to prior tracing from NSTEMI admission last month. CXR showed interstitial pulmonary edema with pulmonary vascular congestion and small right pleural effusion. The appearance is felt to be indicative of congestive heart failure with no airspace consolidation. There is a CT  abdomen pending given her severe c/o of abdominal pain.   She is currently in HD when I spoke with her. She had a central line placed in the ED which is profusely bleeding. She has no CP complaints, her main concern at this point is her nausea, vomiting and ongoing abdominal discomfort.   Past Medical History:  Diagnosis Date  . Anemia   . Bladder mass 02/09/2017  . Bladder tumor 03/01/2017  . Buerger's disease ([place]) 02/08/2014  . CAD (coronary artery disease)    CABG  . CHF (congestive heart failure) (HCC)    Acute on chronic diastolic  . Complication of anesthesia   . Conjunctivitis 08/04/2015  . Diabetes mellitus without complication ([place])    Type II  . DM type 2, uncontrolled, with renal complications ([place]) 9/98/3382  . Dupuytren's contracture of left hand 05/07/2016  . DVT (deep venous thrombosis) ([place])   . ESRD (end stage renal disease) ([place])    HD M-W-F  . ESRD needing dialysis ([place]) 12/14/2013  . Gangrene associated with diabetes mellitus ([place]) 03/20/2014  . Gangrene of lower extremity (HCC)    RLE  . GERD (gastroesophageal reflux disease)   . Hx of AKA (above knee amputation), right ([place])   . Hyperlipidemia   . Hypertension   . Hypothyroidism   . Insomnia 07/09/2015  . 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 PONV (postoperative nausea and vomiting)   . S/P dialysis catheter insertion ([place])   . Stable angina pectoris ([place])   . Status post above knee amputation, right ([place]) 02/08/2014  . Stroke (  [place])    "light stroke" no deficits  . Thrombocytopenia ([place])   . Thyroid disease    Abnormal Thyroid function Test  . UTI (lower urinary tract infection)     Past Surgical History:  Procedure Laterality Date  . ABDOMINAL ANGIOGRAM  12/26/2013   Procedure: ABDOMINAL ANGIOGRAM;  Surgeon: Serafina Vanputten, MD;  Location: [facility] CATH LAB;  Service: Cardiovascular;;  . AMPUTATION Right 02/01/2014   Procedure: Right Below Knee Amputation;  Surgeon: Newt Minion, MD;  Location: [place];  Service: Orthopedics;  Laterality: Right;  . AMPUTATION Right 03/24/2014   Procedure: AMPUTATION ABOVE KNEE;  Surgeon: Newt Minion, MD;  Location: [place];  Service: Orthopedics;  Laterality: Right;  . ARCH AORTOGRAM  12/26/2013   Procedure: ARCH AORTOGRAM;  Surgeon: Serafina Battiste, MD;  Location: [facility] CATH LAB;  Service: Cardiovascular;;  . AXILLARY ARTERY - BRACHIAL ARTERY BYPASS GRAFT  11/07/2013   At [place]     Bilateral  . CHOLECYSTECTOMY    . CORONARY ARTERY BYPASS GRAFT  2012   in [place], [place] N/A 04/07/2017   Procedure: CORONARY STENT INTERVENTION;  Surgeon: Troy Sine, MD;  Location: [place] CV LAB;  Service: Cardiovascular;  Laterality: N/A;  . CYSTOSCOPY W/ RETROGRADES Left 03/01/2017   Procedure: CYSTOSCOPY WITH RETROGRADE PYELOGRAM;  Surgeon: Irine Seal, MD;  Location: [facility];  Service: Urology;  Laterality: Left;  . FOOT AMPUTATION Bilateral   . HARDWARE REMOVAL Right 07/19/2014   Procedure: Removal Deep Hardware Right Femur;  Surgeon: Newt Minion, MD;  Location: [place];  Service: Orthopedics;  Laterality: Right;  Six screws and one condylar plate removed   . hemodialysis catheter    . INSERTION OF DIALYSIS CATHETER Right 02/07/2017   Procedure: INSERTION OF DIALYSIS CATHETER;  Surgeon: Rosetta Posner, MD;  Location: [place];  Service: Vascular;  Laterality: Right;  . LEFT HEART CATH AND CORONARY ANGIOGRAPHY N/A 04/06/2017   Procedure: LEFT HEART CATH AND CORONARY ANGIOGRAPHY;  Surgeon: Troy Sine, MD;  Location: [place] CV LAB;  Service: Cardiovascular;  Laterality: N/A;  . LEFT HEART CATH AND CORS/GRAFTS ANGIOGRAPHY N/A 02/11/2017   Procedure: LEFT HEART CATH AND CORS/GRAFTS ANGIOGRAPHY;  Surgeon: Nelva Bush, MD;  Location: [place] CV LAB;  Service: Cardiovascular;  Laterality: N/A;  . LOWER EXTREMITY ANGIOGRAM N/A 12/26/2013   Procedure: LOWER EXTREMITY  ANGIOGRAM;  Surgeon: Serafina Harman, MD;  Location: [facility] CATH LAB;  Service: Cardiovascular;  Laterality: N/A;  . TRANSURETHRAL RESECTION OF BLADDER TUMOR N/A 03/01/2017   Procedure: TRANSURETHRAL RESECTION OF LARGE BLADDER TUMOR (TURBT)FROM BLADDER NECK;  Surgeon: Irine Seal, MD;  Location: [facility];  Service: Urology;  Laterality: N/A;  . UPPER EXTREMITY ANGIOGRAM Right 12/26/2013   Procedure: UPPER EXTREMITY ANGIOGRAM;  Surgeon: Serafina Roseland, MD;  Location: [facility] CATH LAB;  Service: Cardiovascular;  Laterality: Right;     Prior to Admission medications   Medication Sig Start Date End Date Taking? Authorizing Provider  amLODipine (NORVASC) 10 MG tablet Take 10 mg by mouth daily with breakfast.   Yes [provider]  aspirin EC 81 MG tablet Take 81 mg by mouth daily with breakfast.    Yes [provider]  atorvastatin (LIPITOR) 40 MG tablet Take 40 mg by mouth at bedtime.    Yes [provider]  clopidogrel (PLAVIX) 75 MG tablet Take 1 tablet (75 mg total) by mouth daily with breakfast.  04/12/17  Yes [place] Bing, DO  lanthanum (FOSRENOL) 1000 MG chewable tablet Chew 1,000 mg by mouth See admin instructions. Crush and sprinkle over food one tablet (1000 mg) three times daily with meals   Yes [provider]  levothyroxine (SYNTHROID, LEVOTHROID) 25 MCG tablet Take 25 mcg by mouth daily with breakfast.    Yes [provider]  metoprolol tartrate (LOPRESSOR) 25 MG tablet Take 1 tablet (25 mg total) by mouth 2 (two) times daily. 03/15/17  Yes [place] Derry, [place], DO  senna-docusate (SENOKOT-S) 8.6-50 MG tablet Take 1 tablet by mouth at bedtime as needed for mild constipation. 03/15/17  Yes [place] Derry, [place], DO  thiamine 100 MG tablet Take 1 tablet (100 mg total) by mouth daily. Patient taking differently: Take 100 mg by mouth daily with breakfast.  03/16/17  Yes [place] Derry, [place], DO  citalopram (CELEXA) 20 MG tablet Take 1 tablet (20 mg total) by mouth  daily. Patient not taking: Reported on 05/01/2017 04/12/17   [place] Bing, DO    Inpatient Medications: Scheduled Meds: . [START ON 05/02/2017] aspirin  81 mg Oral Daily  . aspirin  300 mg Rectal NOW  . ondansetron  8 mg Oral Q8H  . promethazine  12.5 mg Rectal Q8H  . THROMBI-PAD  1 each Topical Once   Continuous Infusions:  PRN Meds:   Allergies:    Allergies  Allergen Reactions  . Diphenhydramine Nausea And Vomiting  . Tramadol Hcl Nausea And Vomiting  . Morphine And Related Nausea And Vomiting    Social History:   Social History   Socioeconomic History  . Marital status: Divorced    Spouse name: Not on file  . Number of children: Not on file  . Years of education: Not on file  . Highest education level: Not on file  Social Needs  . Financial resource strain: Not on file  . Food insecurity - worry: Not on file  . Food insecurity - inability: Not on file  . Transportation needs - medical: Not on file  . Transportation needs - non-medical: Not on file  Occupational History  . Not on file  Tobacco Use  . Smoking status: Former Smoker    Years: 30.00    Types: Cigarettes  . Smokeless tobacco: Never Used  . Tobacco comment: quit in 2002  Substance and Sexual Activity  . Alcohol use: No  . Drug use: No  . Sexual activity: Not on file  Other Topics Concern  . Not on file  Social History Narrative   Pt currently living with her brother.   Independent w/ transfers bed>wheelchair    Family History:   Family History  Adopted: Yes  Problem Relation Age of Onset  . Hypertension Father   . Heart disease Father        Coronary Artery Disease   Family Status:  Family Status  Relation Name Status  . Father  Alive  . Brother [name] Alive    ROS:  Please see the history of present illness.  All other ROS reviewed and negative.     Physical Exam/Data:   Vitals:   05/01/17 1400 05/01/17 1415 05/01/17 1430 05/01/17 1445  BP: (!) 178/96 (!) 170/94 (!)  176/93 (!) 178/95  Pulse: (!) 109 (!) 107 (!) 106 (!) 103  Resp:      Temp:      SpO2: 93% (!) 89% (!) 88% (!) 89%  Weight:      Height:  No intake or output data in the 24 hours ending 05/01/17 1613 Filed Weights   05/01/17 0652  Weight: 118 lb (53.5 kg)   Body mass index is 19.64 kg/m.   General: Frail, elderly, ill appearing  Skin: Warm, dry, intact  Head: Normocephalic, atraumatic, clear, moist mucus membranes. Neck: Negative for carotid bruits. No JVD Lungs: Diminished throughout,  No wheezes, rales, or rhonchi. Breathing is unlabored.On [place]/FM 85-88% Cardiovascular: RRR with S1 S2. No murmurs, rubs, or gallops Abdomen: Tender to palpation, non-distended. No obvious abdominal masses. MSK: Strength and tone appear decreased for age. 4/4 in all extremities Extremities: Mild 1+edema. No clubbing or cyanosis. DP/PT pulses 1+.R AKA  Neuro: Alert and oriented. No focal deficits. No facial asymmetry. MAE spontaneously. Psych: Responds to questions appropriately with normal affect.    EKG:  The EKG was personally reviewed and demonstrates: 01/20/19ST-T abnormalities in leads V5-V6 similar to prior tracing from NSTEMI admission last month Telemetry:  Telemetry was personally reviewed and demonstrates: 05/01/17 SR  Relevant CV Studies:  ECHO: 04/05/17 Study Conclusions  - Left ventricle: The cavity size was normal. Wall thickness was   increased in a pattern of moderate LVH. Systolic function was   mildly to moderately reduced. The estimated ejection fraction was   in the range of 40% to 45%. Diffuse hypokinesis. Features are   consistent with a pseudonormal left ventricular filling pattern,   with concomitant abnormal relaxation and increased filling   pressure (grade 2 diastolic dysfunction). - Aortic valve: Trileaflet; severely thickened, severely calcified   leaflets. Valve mobility was restricted. There was mild to   moderate stenosis. There was trivial  regurgitation. Peak velocity   (S): 274 cm/s. Mean gradient (S): 17 mm Hg. - Mitral valve: Severely calcified annulus. Mildly thickened   leaflets . - Left atrium: The atrium was mildly dilated. Volume/bsa, S: 38.7   ml/m^2.  Impressions:  - EF is reduced when compared to prior (45-50%)  -------------------------------------------------------------------  CATH:  04/06/17  Ost RCA to Prox RCA lesion is 100% stenosed.  Mid RCA to Dist RCA lesion is 100% stenosed with 100% stenosed side branch in Acute Mrg.  Prox Cx to Mid Cx lesion is 100% stenosed with 100% stenosed side branch in Ost 2nd Mrg.  Ost LAD to Prox LAD lesion is 80% stenosed.  Prox LAD lesion is 90% stenosed.  Mid LAD-1 lesion is 90% stenosed.  Mid LAD-2 lesion is 95% stenosed.  Origin lesion is 80% stenosed.  Ost 1st Diag lesion is 50% stenosed.   Severe native CAD with calcification and evidence for probable 80% calcified stenoses in the very proximal LAD prior to a moderate size bifurcating diagonal vessel with 50% narrowing in the diagonal vessel and 80% in the distal superior branch of this vessel, diffuse 80-90% and 95% multiple stenoses in a severely calcified mid LAD prior to mid-distal diagonal vessel with the LIMA graft inserting beyond this diagonal vessel; total occlusion of the right proximal circumflex after a diminutive first marginal branch, and total occlusion of the RCA at its ostium.  Subclavian stent with mild luminal irregularity.  80% eccentric ostial stenosis in the LIMA which supplies the distal LAD after mid-distal diagonal vessel.  Patent vein graft supplying the distal marginal vessel of the circumflex coronary artery.  Patent vein graft supplying the distal RCA.  There are septal collaterals from the distal RCA towards the LAD.   RECOMMENDATION: Angiographic findings were reviewed in detail with both Drs. Skains and [employer].   It  appears that the ostial stenosis of the LIMA  graft is tighter than previously thought and is at least 80%.  There is also concern that the very proximal LAD which is calcified and at least 80% stenoses jeopardizing a large diagonal vessel, which is not supplied by the LIMA graft.  The patient will undergo dialysis later today.  Discussion was made concerning possible stage intervention to the LIMA ostium and staged atherectomy to the very proximal LAD before the diagonal vessel with medical therapy for the diffuse stenoses beyond the diagonal vessel in the mid LAD.  Staged PCI 04/07/17:  RECOMMENDATION: The patient will continue with DAPT.  Medical therapy for significant concomitant CAD.  If she continues to experience symptomatology, consider atherectomy of the proximal LAD prior to a large first diagonal takeoff.  Laboratory Data:  Chemistry Recent Labs  Lab 05/01/17 0640  NA 138  K 3.7  CL 97*  CO2 23  GLUCOSE 267*  BUN 38*  CREATININE 5.86*  CALCIUM 9.5  GFRNONAA 7*  GFRAA 8*  ANIONGAP 18*    Total Protein  Date Value Ref Range Status  05/01/2017 7.9 6.5 - 8.1 g/dL Final   Albumin  Date Value Ref Range Status  05/01/2017 3.5 3.5 - 5.0 g/dL Final   AST  Date Value Ref Range Status  05/01/2017 20 15 - 41 U/L Final   ALT  Date Value Ref Range Status  05/01/2017 11 (L) 14 - 54 U/L Final   Alkaline Phosphatase  Date Value Ref Range Status  05/01/2017 113 38 - 126 U/L Final   Total Bilirubin  Date Value Ref Range Status  05/01/2017 0.6 0.3 - 1.2 mg/dL Final   Hematology Recent Labs  Lab 05/01/17 0640  WBC 9.0  RBC 3.72*  HGB 11.8*  HCT 36.9  MCV 99.2  MCH 31.7  MCHC 32.0  RDW 17.8*  PLT 173   Cardiac Enzymes Recent Labs  Lab 05/01/17 0640  TROPONINI 0.43*   No results for input(s): TROPIPOC in the last 168 hours.  BNPNo results for input(s): BNP, PROBNP in the last 168 hours.  DDimer No results for input(s): DDIMER in the last 168 hours. TSH:  Lab Results  Component Value Date   TSH 5.737  (H) 04/05/2017   Lipids:No results found for: CHOL, HDL, LDLCALC, LDLDIRECT, TRIG, CHOLHDL HgbA1c: Lab Results  Component Value Date   HGBA1C 6.8 (H) 02/07/2017    Radiology/Studies:  Dg Chest Portable 1 View  Result Date: 05/01/2017 CLINICAL DATA:  central line placement EXAM: PORTABLE CHEST 1 VIEW COMPARISON:  05/01/2017 FINDINGS: RIGHT central venous line unchanged. New LEFT central venous line tip in the mid SVC. Stable cardiac silhouette. Increased RIGHT upper lobe airspace opacity. Lung bases clear. IMPRESSION: 1. Increasing RIGHT upper lobe airspace opacities suggesting pulmonary edema. 2. Introduction of LEFT central venous line without pneumothorax. Electronically Signed   By: Suzy Bouchard M.D.   On: 05/01/2017 15:51   Dg Chest Portable 1 View  Result Date: 05/01/2017 CLINICAL DATA:  Chest pain EXAM: PORTABLE CHEST 1 VIEW COMPARISON:  April 05, 2017 FINDINGS: Central catheter tip is in the right atrium slightly beyond the cavoatrial junction. No pneumothorax. There is interstitial pulmonary edema with a small right pleural effusion. There is cardiomegaly with pulmonary venous hypertension. There is aortic atherosclerosis. Patient is status post median sternotomy. No evident adenopathy. There is postoperative change in each lower neck region. There is extensive carotid and axillary artery calcification. Temporary pacemaker wires are attached to the  right heart. IMPRESSION: Interstitial pulmonary edema with pulmonary vascular congestion and small right pleural effusion. The appearance is felt to be indicative of congestive heart failure. No airspace consolidation. Central catheter as described without pneumothorax. Temporary pacemaker wires attached to right heart. There is extensive vascular calcification with aortic atherosclerosis as well as extensive bilateral carotid and axillary artery calcification. Aortic Atherosclerosis (ICD10-I70.0). Electronically Signed   By: Lowella Grip III M.D.   On: 05/01/2017 07:11    Assessment and Plan:   1.Elevated Troponin: -Trend troponin levels,inital 0.43 -Denies chest pain  -Hold off on heprin for now given significant bleeding of central line  -Remains unclear if the nausea is truly an anginal equivalent given her multiple and severe co-morbities.  -Will consider a cardiac cath with atherectomy of the proximal LAD prior to a large first diagonal takeoff if symptoms persist per cath note from 04/07/18 -Pt on ASA, plavix, BB, and statin>> reports compliance -Last echo 04/05/18, EF of 40-45%, hold off on repeat until more stable  2. ESRD: -Per IM -HD MWF, HD today 05/01/17  3. Abdominal pain: -Per IM -CT abdomen pending for further evaluation   4. Systolic HF: -Echo from last admission, EF 40-45% -Daily weights>> per HD -HD to correct fluid volume overload -Will hold off on repeat echo at this time  5. Right pleural effusion: -CXR completed in ED -Decreased saturations>> encouraged FM for oxygenation  -Per IM  6. Hypertension: -Elevated -Restart amlodipine, lopressor   For questions or updates, please contact [place] Please consult [email or web address] for contact info under Cardiology/STEMI.   SignedKathyrn Drown NP-C [facility] Pager: [phone] 05/01/2017 4:13 PM

## 2017-05-01 NOTE — Progress Notes (Signed)
IV team consulted due to bleeding of new RIJ. Bp blood elevated and patient continues to complain of abdominal pain. MD said to give oxycodone as ordered. Will continue to monitor patient.

## 2017-05-01 NOTE — Progress Notes (Signed)
FPTS Interim Progress Note  Paged to room via IV team to assess mass at [facility] catheter (see below, about 2cm in length, ~1.5cm width, firm). Reported to have had oozing from site throughout the day after placement. Patient on Plavix, ASA 81mg , subq heparin. Contacted CCM who placed catheter and stated it looked like external clot and if able to draw back then should be able to use. IV team was able to place peripheral IV, currently using for antibiotics. Obtaining stat CXR to check placement and CBC with platelets. With PIV in place, will defer use of LIJ until able to check placement. Discontinued heparin.  Also received elevated troponin of 4.52, up from 0.43 earlier today. Obtaining stat EKG and continue trending troponins. Spoke with Cardiology fellow on call and agreed with attestation from Dr. Lysbeth Penner note earlier today that stated patient would not be candidate for cath at this time. Did not recommend heparin due to bleeding around LIJ site. Cardiology will continue to follow.  BP at this time hypotensive, 73/58. Giving 1L fluid bolus. Due to access issues earlier in the day, patient just now receiving IV antibiotics for presumed UTI, awaiting urine cultures.   Rory Percy, DO 05/02/2017, 12:26 AM PGY-1, [place] [facility] pager [phone]

## 2017-05-01 NOTE — H&P (Signed)
[facility] Admission History and Physical Service Pager: [phone]  Patient name: Brandi Arellano Medical record number: [id number] Date of birth: 09-20-1955 Age: 62 y.o. Gender: female  Primary Care Provider: Kerin Perna, NP Consultants: Nephrology Code Status: full  Chief Complaint: abdominal pain, nausea, and vomiting  Assessment and Plan: Kirsi Hugh is a 62 y.o. female presenting with diffuse abdominal pain, nausea, and vomiting.  She also has hypertensive urgency in the ED.  PMH is significant for NSTEMI in 12/18, bladder cancer, T2DM, ESRD, borderline HFpEF w/ EF 40-45%, hypothyroidism, right AKA, and previous stroke.    Abdominal pain, nausea, vomiting: Patient reports symptoms similar to when she was diagnosed with an NSTEMI in 12/18.  Tender to light palpation diffusely.  EKG with some changes from prior.  Lipase elevated to 137, has been elevated to 295 in Sept. 2018.  LFTs wnl.  WBC wnl.  Troponin elevated to 0.43, was 20 on 04/07/17.  Lactic acid in process.  CXR with pulmonary edema and vascular congestion with small right pleural effusion indicative of CHF with no consolidation.  CT abdomen in process.  Differential includes pancreatitis, MI, gastroenteritis, ischemic colitis, constipation, gastritis and catheter associated UTI.  Given her significant pain and tenderness, pancreatitis and ischemic colitis are the most likely diagnoses, however patient's symptoms are similar to her previous NSTEMI, so MI is another possibility.  CT abdomen/pelvis will help narrow differential.  IV access has been difficult to achieve with two attempts by the IV team, so a central line will be placed. - admit to stepdown, attending Dr. McDiarmid - vitals per unit routine - continuous pulse oximetry - gentle maintenance NS @ 75 ml/hr - NPO with sips with meds for bowel rest, ADAT - oxycodone IR 5 mg Q4H PRN - reglan 5 mg PO or 5 mg IV BID PRN for nausea  (patient's symptoms responded to reglan in the past) - measure troponin again this pm, if plateaued or decreased, will stop measuring - am EKG - place central line and start zosyn for possible catheter associated UTI - obtain urine Cx and change out foley - cardiology consult for concerning EKG in patient with recent NSTEMI  Hypertensive urgency: BP elevated to 203/101, likely due to pain and vomiting.  No signs of end organ damage.  Most recent BP 179/78.  Since patient's MAP already appropriately decreased by 25% and she will receive dialysis today, will not add BP medication to further decrease BP. - home amlodipine 10 mg starting 1/21  Recent NSTEMI, with CAD s/p remote CABG: PCI and placement of drug-eluting stent of LIMA performed on 04/07/17.  Patient on ASA, plavix, atorvastatin, and lopressor at home. - continue home medications - trend troponins x 3 - am EKG - cardiology consulted, appreciate recommendations  Squamous cell bladder cancer: Has had surgery to remove tumor, scheduled for nephroureterectomy with cystectomy in March. - consider palliative care consult; patient has been resistant to palliative care in the past  T2DM: Well-controlled, with Hemoglobin A1c 6.8 on 02/07/17.  Diet controlled. - sensitive SSI Q4H with Q4H CBGs - will change to [facility] insulin when diet is advanced - repeat Hgb A1c  ESRD: Patient volume overloaded on exam and on CXR.  Nephrology consulted with plan for emergent dialysis on 1/20 pm.  Gets dialysis MWF with last session on 1/18. - appreciate nephrology recs - daily renal function panel - daily weights - continue home thiamine and lanthanum  Borderline HFpEF: Last EF 40-45% on 04/05/17.  Volume overloaded on exam and on CXR. - continue Lopressor - daily weights - dialysis to correct volume overload  Hypothyroidism: Last TSH was 5.737 on 12/25.  Takes synthroid 25 mcg daily. 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  Takes ASA, plavix, atorvastatin - continue home medications  FEN/GI: NPO, NS @ 75 ml/hr Prophylaxis: heparin 5000 U  Disposition: admit to stepdown, attending Dr. McDiarmid  History of Present Illness:  Brandi Arellano is a 62 y.o. female presenting with abdominal pain, nausea, and vomiting.  She says that she had three episodes of nonbilious, nonbloody emesis starting last night, and continues to spit in the emesis bag in the ED.  Her abdominal pain has been going on for the last week.  She says these symptoms are similar to the symptoms she experienced when she was diagnosed with an NSTEMI in December 2018.  She denies diarrhea and says that she was able to eat last night.  She went to her full dialysis session on Friday, 1/18 and goes to dialysis on MWF.  History is limited by patient's acute pain and distress.  Review Of Systems: Per HPI with the following additions: ROS limited by patient condition  Review of Systems  Constitutional: Negative for chills and fever.  Respiratory: Negative for cough and shortness of breath.   Cardiovascular: Negative for chest pain.  Gastrointestinal: Negative for constipation.  Neurological: Negative for headaches.    Patient Active Problem List   Diagnosis Date Noted  . Elevated troponin   . NSTEMI (non-ST elevated myocardial infarction) ([place]) 04/05/2017  . Hypoxia 04/04/2017  . Palliative care by specialist   . Acute blood loss anemia   . Bladder carcinoma ([place]) 03/05/2017  . Bladder tumor 03/01/2017  . Type 2 diabetes mellitus with chronic kidney disease on chronic dialysis, with long-term current use of insulin ([place])   . Urinary tract infection associated with indwelling urethral catheter ([place])   . S/P dialysis catheter insertion ([place])   . Nausea and vomiting   . Abdominal pain   . Stable angina pectoris ([place])   . Abnormal stress test   . Bladder mass 02/09/2017  . Preoperative evaluation to  rule out surgical contraindication 02/09/2017  . Goals of care, counseling/discussion   . ESRD on hemodialysis ([place])   . Altered mental status 02/07/2017  . Axillary mass, left 07/01/2016  . Dupuytren's contracture of left hand 05/07/2016  . HLD (hyperlipidemia) 08/04/2015  . Conjunctivitis 08/04/2015  . Insomnia 07/09/2015  . Gangrene associated with diabetes mellitus ([place]) 03/20/2014  . Gastroparesis due to DM ([place]) 02/12/2014  . Status post above knee amputation, right ([place]) 02/08/2014  . Buerger's disease ([place]) 02/08/2014  . Hyperparathyroidism, secondary renal ([place]) 02/06/2014  . Hyperphosphatemia 02/06/2014  . Anemia due to chronic kidney disease, on chronic dialysis ([place]) 02/06/2014  . Ischemic ulcer of right foot with necrosis of bone ([place]) 01/27/2014  . Hypertension 12/26/2013  . DM type 2, uncontrolled, with renal complications ([place]) 41/96/2229  . CKD (chronic kidney disease) 12/26/2013  . Hypothyroidism 12/26/2013  . CAD (coronary artery disease) 12/26/2013  . ESRD needing dialysis ([place]) 12/14/2013    Past Medical History: Past Medical History:  Diagnosis Date  . Anemia   . Bladder mass 02/09/2017  . CAD (coronary artery disease)    CABG  . CHF (congestive heart failure) (HCC)    Acute on chronic diastolic  . Complication of anesthesia   . Diabetes mellitus  without complication (HCC)    Type II  . DVT (deep venous thrombosis) ([place])   . ESRD (end stage renal disease) ([place])    HD M-W-F  . Gangrene of lower extremity (HCC)    RLE  . GERD (gastroesophageal reflux disease)   . Hx of AKA (above knee amputation), right ([place])   . Hyperlipidemia   . Hypertension   . Hypothyroidism   . PONV (postoperative nausea and vomiting)   . Stroke [facility])    "light stroke" no deficits  . Thrombocytopenia ([place])   . Thyroid disease    Abnormal Thyroid function Test  . UTI (lower urinary tract infection)     Past Surgical History: Past Surgical History:  Procedure  Laterality Date  . ABDOMINAL ANGIOGRAM  12/26/2013   Procedure: ABDOMINAL ANGIOGRAM;  Surgeon: Serafina Sweeny, MD;  Location: [facility] CATH LAB;  Service: Cardiovascular;;  . AMPUTATION Right 02/01/2014   Procedure: Right Below Knee Amputation;  Surgeon: Newt Minion, MD;  Location: [place];  Service: Orthopedics;  Laterality: Right;  . AMPUTATION Right 03/24/2014   Procedure: AMPUTATION ABOVE KNEE;  Surgeon: Newt Minion, MD;  Location: [place];  Service: Orthopedics;  Laterality: Right;  . ARCH AORTOGRAM  12/26/2013   Procedure: ARCH AORTOGRAM;  Surgeon: Serafina Miceli, MD;  Location: [facility] CATH LAB;  Service: Cardiovascular;;  . AXILLARY ARTERY - BRACHIAL ARTERY BYPASS GRAFT  11/07/2013   At [place]     Bilateral  . CHOLECYSTECTOMY    . CORONARY ARTERY BYPASS GRAFT  2012   in [place], [place] N/A 04/07/2017   Procedure: CORONARY STENT INTERVENTION;  Surgeon: Troy Sine, MD;  Location: [place] CV LAB;  Service: Cardiovascular;  Laterality: N/A;  . CYSTOSCOPY W/ RETROGRADES Left 03/01/2017   Procedure: CYSTOSCOPY WITH RETROGRADE PYELOGRAM;  Surgeon: Irine Seal, MD;  Location: [facility];  Service: Urology;  Laterality: Left;  . FOOT AMPUTATION Bilateral   . HARDWARE REMOVAL Right 07/19/2014   Procedure: Removal Deep Hardware Right Femur;  Surgeon: Newt Minion, MD;  Location: [place];  Service: Orthopedics;  Laterality: Right;  Six screws and one condylar plate removed   . hemodialysis catheter    . INSERTION OF DIALYSIS CATHETER Right 02/07/2017   Procedure: INSERTION OF DIALYSIS CATHETER;  Surgeon: Rosetta Posner, MD;  Location: [place];  Service: Vascular;  Laterality: Right;  . LEFT HEART CATH AND CORONARY ANGIOGRAPHY N/A 04/06/2017   Procedure: LEFT HEART CATH AND CORONARY ANGIOGRAPHY;  Surgeon: Troy Sine, MD;  Location: [place] CV LAB;  Service: Cardiovascular;  Laterality: N/A;  . LEFT HEART CATH AND CORS/GRAFTS  ANGIOGRAPHY N/A 02/11/2017   Procedure: LEFT HEART CATH AND CORS/GRAFTS ANGIOGRAPHY;  Surgeon: Nelva Bush, MD;  Location: [place] CV LAB;  Service: Cardiovascular;  Laterality: N/A;  . LOWER EXTREMITY ANGIOGRAM N/A 12/26/2013   Procedure: LOWER EXTREMITY ANGIOGRAM;  Surgeon: Serafina Poulson, MD;  Location: [facility] CATH LAB;  Service: Cardiovascular;  Laterality: N/A;  . TRANSURETHRAL RESECTION OF BLADDER TUMOR N/A 03/01/2017   Procedure: TRANSURETHRAL RESECTION OF LARGE BLADDER TUMOR (TURBT)FROM BLADDER NECK;  Surgeon: Irine Seal, MD;  Location: [facility];  Service: Urology;  Laterality: N/A;  . UPPER EXTREMITY ANGIOGRAM Right 12/26/2013   Procedure: UPPER EXTREMITY ANGIOGRAM;  Surgeon: Serafina Sessler, MD;  Location: [facility] CATH LAB;  Service: Cardiovascular;  Laterality: Right;    Social History: Social History   Tobacco Use  .  Smoking status: Former Smoker    Years: 30.00    Types: Cigarettes  . Smokeless tobacco: Never Used  . Tobacco comment: quit in 2002  Substance Use Topics  . Alcohol use: No  . Drug use: No   Additional social history:   Please also refer to relevant sections of EMR.  Family History: Family History  Adopted: Yes  Problem Relation Age of Onset  . Hypertension Father   . Heart disease Father        Coronary Artery Disease   (If not completed, MUST add something in)  Allergies and Medications: Allergies  Allergen Reactions  . Diphenhydramine Nausea And Vomiting  . Tramadol Hcl Nausea And Vomiting  . Morphine And Related Nausea And Vomiting   Current Facility-Administered Medications on File Prior to Encounter  Medication Dose Route Frequency Provider Last Rate Last Dose  . 0.9 %  sodium chloride infusion    Continuous PRN Laretta Alstrom, CRNA       Current Outpatient Medications on File Prior to Encounter  Medication Sig Dispense Refill  . amLODipine (NORVASC) 10 MG tablet Take 10 mg by mouth daily with breakfast.    . aspirin EC 81 MG tablet  Take 81 mg by mouth daily with breakfast.     . atorvastatin (LIPITOR) 40 MG tablet Take 40 mg by mouth at bedtime.     . clopidogrel (PLAVIX) 75 MG tablet Take 1 tablet (75 mg total) by mouth daily with breakfast. 30 tablet 0  . lanthanum (FOSRENOL) 1000 MG chewable tablet Chew 1,000 mg by mouth See admin instructions. Crush and sprinkle over food one tablet (1000 mg) three times daily with meals    . levothyroxine (SYNTHROID, LEVOTHROID) 25 MCG tablet Take 25 mcg by mouth daily with breakfast.     . metoprolol tartrate (LOPRESSOR) 25 MG tablet Take 1 tablet (25 mg total) by mouth 2 (two) times daily. 60 tablet 0  . senna-docusate (SENOKOT-S) 8.6-50 MG tablet Take 1 tablet by mouth at bedtime as needed for mild constipation. 30 tablet 0  . thiamine 100 MG tablet Take 1 tablet (100 mg total) by mouth daily. (Patient taking differently: Take 100 mg by mouth daily with breakfast. ) 30 tablet 0  . citalopram (CELEXA) 20 MG tablet Take 1 tablet (20 mg total) by mouth daily. (Patient not taking: Reported on 05/01/2017) 30 tablet 0    Objective: BP (!) 180/89   Pulse (!) 101   Temp 98.5 F (36.9 C)   Resp (!) 24   Ht 5\' 5"  (1.651 m)   Wt 118 lb (53.5 kg)   SpO2 93%   BMI 19.64 kg/m  Physical Exam  Constitutional: She appears ill. She appears distressed.  Appears frail and very uncomfortable, writhing in bed  HENT:  Head: Normocephalic and atraumatic.  Eyes: EOM are normal. No scleral icterus.  Cardiovascular: Normal rate and regular rhythm.  Murmur heard. Murmur likely d/t fistula  Pulmonary/Chest: Effort normal. She has rales.  Crackles especially in right base  Abdominal: Soft. Normal appearance and bowel sounds are normal. There is generalized tenderness. There is CVA tenderness. There is no rigidity and no rebound.  Musculoskeletal:  Right AKA  Neurological: She is alert.  Skin: Skin is warm and dry.  Bilateral scars along carotids, CABG scar, abdominal scars likely from  laparoscopic surgery  Subclavian dialysis catheter in place     Labs and Imaging: CBC BMET  Recent Labs  Lab 05/01/17 0640  WBC 9.0  HGB 11.8*  HCT 36.9  PLT 173   Recent Labs  Lab 05/01/17 0640  NA 138  K 3.7  CL 97*  CO2 23  BUN 38*  CREATININE 5.86*  GLUCOSE 267*  CALCIUM 9.5      Dg Chest Portable 1 View  Result Date: 05/01/2017 CLINICAL DATA:  Chest pain EXAM: PORTABLE CHEST 1 VIEW COMPARISON:  April 05, 2017 FINDINGS: Central catheter tip is in the right atrium slightly beyond the cavoatrial junction. No pneumothorax. There is interstitial pulmonary edema with a small right pleural effusion. There is cardiomegaly with pulmonary venous hypertension. There is aortic atherosclerosis. Patient is status post median sternotomy. No evident adenopathy. There is postoperative change in each lower neck region. There is extensive carotid and axillary artery calcification. Temporary pacemaker wires are attached to the right heart. IMPRESSION: Interstitial pulmonary edema with pulmonary vascular congestion and small right pleural effusion. The appearance is felt to be indicative of congestive heart failure. No airspace consolidation. Central catheter as described without pneumothorax. Temporary pacemaker wires attached to right heart. There is extensive vascular calcification with aortic atherosclerosis as well as extensive bilateral carotid and axillary artery calcification. Aortic Atherosclerosis (ICD10-I70.0). Electronically Signed   By: Lowella Grip III M.D.   On: 05/01/2017 07:11     Kathrene Alu, MD 05/01/2017, 11:57 AM PGY-1, [place] Intern pager: [phone], text pages welcome  FPTS Upper-Level Resident Addendum  I have independently interviewed and examined the patient. I have discussed the above with the original author and agree with their documentation. My edits for correction/addition/clarification are in blue. Please see also any  attending notes.   Bufford Lope, DO PGY-2, [facility] 05/01/2017 3:53 PM  FPTS Service pager: [phone] (text pages welcome through [facility])

## 2017-05-01 NOTE — Progress Notes (Signed)
Pharmacy Antibiotic Note  Brandi Arellano is a 62 y.o. female admitted on 05/01/2017 with UTI.  Pharmacy has been consulted for Zosyn dosing. Patient with history of UTIs from enterococcus.  ESRD on HD.  Plan: Zosyn 3.375g IV q12h EI Follow c/s, HD schedule/tolernace, LOT, de-escalation   Height: 5\' 5"  (165.1 cm) Weight: (refused standing wt.) IBW/kg (Calculated) : 57  Temp (24hrs), Avg:98.2 F (36.8 C), Min:97.6 F (36.4 C), Max:98.5 F (36.9 C)  Recent Labs  Lab 05/01/17 0640  WBC 9.0  CREATININE 5.86*    Estimated Creatinine Clearance: 8.6 mL/min (A) (by C-G formula based on SCr of 5.86 mg/dL (H)).    Allergies  Allergen Reactions  . Diphenhydramine Nausea And Vomiting  . Tramadol Hcl Nausea And Vomiting  . Morphine And Related Nausea And Vomiting    Antimicrobials this admission: Zosyn 1/20 >>   Dose adjustments this admission: n/a  Microbiology results: 1/20 UCx:     Thank you for allowing pharmacy to be a part of this patient's care.  Justinian Miano D. Denys Labree, PharmD, BCPS Clinical Pharmacist  [phone] 05/01/2017 9:10 PM

## 2017-05-01 NOTE — ED Notes (Signed)
IV infiltrated when being power flushed with saline at CT. IV team at bedside for ultrasound IV placement.

## 2017-05-01 NOTE — ED Notes (Signed)
Per admitting MD pt to have CT without contrast instead.

## 2017-05-01 NOTE — ED Notes (Signed)
Dialysis based report given to dialysis RN.

## 2017-05-01 NOTE — ED Provider Notes (Signed)
[place] EMERGENCY DEPARTMENT Provider Note   CSN: [id number] Arrival date & time: 05/01/17  4098     History   Chief Complaint No chief complaint on file.   HPI Brandi Arellano is a 62 y.o. female.  Patient presents from home with diffuse abdominal pain with nausea and vomiting since last night.  She reports 3 episodes of nonbilious nonbloody emesis similar to when she was diagnosed with a NSTEMI a month ago.  She denies any diarrhea.  She denies any cough or fever.  She reports normal dialysis session yesterday and normally goes Monday Wednesday and Friday.  She makes very little urine.  Denies any missed sessions.  She told EMS she had a headache which she denies to me.  She states the symptoms are similar to when she presented last month and was found to have an MI.  Patient also has history of bladder cancer, diabetes, CHF, previous right AKA and previous stroke.   The history is provided by the patient and the EMS personnel. The history is limited by the condition of the patient.    Past Medical History:  Diagnosis Date  . Anemia   . Bladder mass 02/09/2017  . CAD (coronary artery disease)    CABG  . CHF (congestive heart failure) (HCC)    Acute on chronic diastolic  . Complication of anesthesia   . Diabetes mellitus without complication (HCC)    Type II  . DVT (deep venous thrombosis) ([place])   . ESRD (end stage renal disease) ([place])    HD M-W-F  . Gangrene of lower extremity (HCC)    RLE  . GERD (gastroesophageal reflux disease)   . Hx of AKA (above knee amputation), right ([place])   . Hyperlipidemia   . Hypertension   . Hypothyroidism   . PONV (postoperative nausea and vomiting)   . Stroke [facility])    "light stroke" no deficits  . Thrombocytopenia ([place])   . Thyroid disease    Abnormal Thyroid function Test  . UTI (lower urinary tract infection)     Patient Active Problem List   Diagnosis Date Noted  . Elevated troponin   . NSTEMI (non-ST  elevated myocardial infarction) ([place]) 04/05/2017  . Hypoxia 04/04/2017  . Palliative care by specialist   . Acute blood loss anemia   . Bladder carcinoma ([place]) 03/05/2017  . Bladder tumor 03/01/2017  . Type 2 diabetes mellitus with chronic kidney disease on chronic dialysis, with long-term current use of insulin ([place])   . Urinary tract infection associated with indwelling urethral catheter ([place])   . S/P dialysis catheter insertion ([place])   . Nausea and vomiting   . Abdominal pain   . Stable angina pectoris ([place])   . Abnormal stress test   . Bladder mass 02/09/2017  . Preoperative evaluation to rule out surgical contraindication 02/09/2017  . Goals of care, counseling/discussion   . ESRD on hemodialysis ([place])   . Altered mental status 02/07/2017  . Axillary mass, left 07/01/2016  . Dupuytren's contracture of left hand 05/07/2016  . HLD (hyperlipidemia) 08/04/2015  . Conjunctivitis 08/04/2015  . Insomnia 07/09/2015  . Gangrene associated with diabetes mellitus ([place]) 03/20/2014  . Gastroparesis due to DM ([place]) 02/12/2014  . Status post above knee amputation, right ([place]) 02/08/2014  . Buerger's disease ([place]) 02/08/2014  . Hyperparathyroidism, secondary renal ([place]) 02/06/2014  . Hyperphosphatemia 02/06/2014  . Anemia due to chronic kidney disease, on chronic dialysis ([place]) 02/06/2014  . 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 Hypertension 12/26/2013  . DM type 2, uncontrolled, with renal complications ([place]) 30/16/0109  . CKD (chronic kidney disease) 12/26/2013  . Hypothyroidism 12/26/2013  . CAD (coronary artery disease) 12/26/2013  . ESRD needing dialysis ([place]) 12/14/2013    Past Surgical History:  Procedure Laterality Date  . ABDOMINAL ANGIOGRAM  12/26/2013   Procedure: ABDOMINAL ANGIOGRAM;  Surgeon: Serafina Sorber, MD;  Location: [facility] CATH LAB;  Service: Cardiovascular;;  . AMPUTATION Right 02/01/2014   Procedure: Right Below Knee Amputation;   Surgeon: Newt Minion, MD;  Location: [place];  Service: Orthopedics;  Laterality: Right;  . AMPUTATION Right 03/24/2014   Procedure: AMPUTATION ABOVE KNEE;  Surgeon: Newt Minion, MD;  Location: [facility];  Service: Orthopedics;  Laterality: Right;  . ARCH AORTOGRAM  12/26/2013   Procedure: ARCH AORTOGRAM;  Surgeon: Serafina Haan, MD;  Location: [facility] CATH LAB;  Service: Cardiovascular;;  . AXILLARY ARTERY - BRACHIAL ARTERY BYPASS GRAFT  11/07/2013   At [place]     Bilateral  . CHOLECYSTECTOMY    . CORONARY ARTERY BYPASS GRAFT  2012   in [place] N/A 04/07/2017   Procedure: CORONARY STENT INTERVENTION;  Surgeon: Troy Sine, MD;  Location: [place] CV LAB;  Service: Cardiovascular;  Laterality: N/A;  . CYSTOSCOPY W/ RETROGRADES Left 03/01/2017   Procedure: CYSTOSCOPY WITH RETROGRADE PYELOGRAM;  Surgeon: Irine Seal, MD;  Location: [facility];  Service: Urology;  Laterality: Left;  . FOOT AMPUTATION Bilateral   . HARDWARE REMOVAL Right 07/19/2014   Procedure: Removal Deep Hardware Right Femur;  Surgeon: Newt Minion, MD;  Location: [place];  Service: Orthopedics;  Laterality: Right;  Six screws and one condylar plate removed   . hemodialysis catheter    . INSERTION OF DIALYSIS CATHETER Right 02/07/2017   Procedure: INSERTION OF DIALYSIS CATHETER;  Surgeon: Rosetta Posner, MD;  Location: [place];  Service: Vascular;  Laterality: Right;  . LEFT HEART CATH AND CORONARY ANGIOGRAPHY N/A 04/06/2017   Procedure: LEFT HEART CATH AND CORONARY ANGIOGRAPHY;  Surgeon: Troy Sine, MD;  Location: [place] CV LAB;  Service: Cardiovascular;  Laterality: N/A;  . LEFT HEART CATH AND CORS/GRAFTS ANGIOGRAPHY N/A 02/11/2017   Procedure: LEFT HEART CATH AND CORS/GRAFTS ANGIOGRAPHY;  Surgeon: Nelva Bush, MD;  Location: [place] CV LAB;  Service: Cardiovascular;  Laterality: N/A;  . LOWER EXTREMITY ANGIOGRAM N/A 12/26/2013   Procedure:  LOWER EXTREMITY ANGIOGRAM;  Surgeon: Serafina Stratmann, MD;  Location[facility]ATH LAB;  Service: Cardiovascular;  Laterality: N/A;  . TRANSURETHRAL RESECTION OF BLADDER TUMOR N/A 03/01/2017   Procedure: TRANSURETHRAL RESECTION OF LARGE BLADDER TUMOR (TURBT)FROM BLADDER NECK;  Surgeon: Irine Seal, MD;  Location: [facility];  Service: Urology;  Laterality: N/A;  . UPPER EXTREMITY ANGIOGRAM Right 12/26/2013   Procedure: UPPER EXTREMITY ANGIOGRAM;  Surgeon: Serafina Suppes, MD;  Location: [facility] CATH LAB;  Service: Cardiovascular;  Laterality: Right;    OB History    Gravida Para Term Preterm AB Living   3 3           SAB TAB Ectopic Multiple Live Births                   Home Medications    Prior to Admission medications   Medication Sig Start Date End Date Taking? Authorizing Provider  amLODipine (NORVASC) 10 MG tablet Take 10 mg by  mouth daily with breakfast.    [provider]  aspirin EC 81 MG tablet Take 81 mg by mouth daily with breakfast.     [provider]  atorvastatin (LIPITOR) 40 MG tablet Take 40 mg by mouth at bedtime.     [provider]  citalopram (CELEXA) 20 MG tablet Take 1 tablet (20 mg total) by mouth daily. 04/12/17   Notus Bing, DO  clopidogrel (PLAVIX) 75 MG tablet Take 1 tablet (75 mg total) by mouth daily with breakfast. 04/12/17   Huntleigh Bing, DO  lanthanum (FOSRENOL) 1000 MG chewable tablet Chew 1,000 mg by mouth See admin instructions. Crush and sprinkle over food one tablet (1000 mg) three times daily with meals    [provider]  levothyroxine (SYNTHROID, LEVOTHROID) 25 MCG tablet Take 25 mcg by mouth daily with breakfast.     [provider]  metoprolol tartrate (LOPRESSOR) 25 MG tablet Take 1 tablet (25 mg total) by mouth 2 (two) times daily. Patient taking differently: Take 25 mg by mouth See admin instructions. Take 1 tablet (25 mg) by mouth twice daily - with breakfast (5am) and with lunch (noon) 03/15/17   Shirley,  [place], DO  senna-docusate (SENOKOT-S) 8.6-50 MG tablet Take 1 tablet by mouth at bedtime as needed for mild constipation. 03/15/17   Shirley, [place], DO  thiamine 100 MG tablet Take 1 tablet (100 mg total) by mouth daily. Patient taking differently: Take 100 mg by mouth daily with breakfast. Vitamin B1 03/16/17   Shirley, [place], DO    Family History Family History  Adopted: Yes  Problem Relation Age of Onset  . Hypertension Father   . Heart disease Father        Coronary Artery Disease    Social History Social History   Tobacco Use  . Smoking status: Former Smoker    Years: 30.00    Types: Cigarettes  . Smokeless tobacco: Never Used  . Tobacco comment: quit in 2002  Substance Use Topics  . Alcohol use: No  . Drug use: No     Allergies   Diphenhydramine; Tramadol hcl; and Morphine and related   Review of Systems Review of Systems  Constitutional: Positive for activity change and appetite change. Negative for fatigue.  HENT: Negative for congestion and rhinorrhea.   Eyes: Negative for visual disturbance.  Respiratory: Positive for chest tightness.   Cardiovascular: Negative for chest pain.  Gastrointestinal: Positive for abdominal pain, nausea and vomiting.  Genitourinary: Negative for dysuria, frequency, genital sores, vaginal bleeding and vaginal discharge.  Musculoskeletal: Negative for arthralgias and back pain.  Neurological: Positive for weakness. Negative for dizziness, light-headedness and headaches.   all other systems are negative except as noted in the HPI and PMH.     Physical Exam Updated Vital Signs BP (!) 193/88   Pulse (!) 101   Temp 98.5 F (36.9 C)   Resp 19   Ht 5\' 5"  (1.651 m)   Wt 53.5 kg (118 lb)   SpO2 97%   BMI 19.64 kg/m   Physical Exam  Constitutional: She is oriented to person, place, and time. She appears well-developed and well-nourished. She appears ill. No distress.  Chronically ill appearing Increased work of breathing    HENT:  Head: Normocephalic and atraumatic.  Mouth/Throat: Oropharynx is clear and moist. No oropharyngeal exudate.  Eyes: Conjunctivae and EOM are normal. Pupils are equal, round, and reactive to light.  Neck: Normal range of motion. Neck supple.  No meningismus.  Cardiovascular: Normal rate, regular rhythm, normal heart sounds and intact distal pulses.  No murmur heard. Dialysis catheter R chest  Pulmonary/Chest: Effort normal. No respiratory distress. She has rales. She exhibits no tenderness.  Basilar crackles  Abdominal: Soft. She exhibits distension. There is tenderness in the epigastric area. There is no rebound and no guarding.  Diffuse tenderness with voluntary guarding. Soft.  Genitourinary:  Genitourinary Comments: Indwelling foley in place  Musculoskeletal: Normal range of motion. She exhibits no edema or tenderness.  R AKA  Neurological: She is alert and oriented to person, place, and time. No cranial nerve deficit. She exhibits normal muscle tone. Coordination normal.   5/5 strength throughout. CN 2-12 intact.Equal grip strength.   Skin: Skin is warm.  Psychiatric: She has a normal mood and affect. Her behavior is normal.  Nursing note and vitals reviewed.    ED Treatments / Results  Labs (all labs ordered are listed, but only abnormal results are displayed) Labs Reviewed  CBC WITH DIFFERENTIAL/PLATELET - Abnormal; Notable for the following components:      Result Value   RBC 3.72 (*)    Hemoglobin 11.8 (*)    RDW 17.8 (*)    Lymphs Abs 0.6 (*)    All other components within normal limits  COMPREHENSIVE METABOLIC PANEL - Abnormal; Notable for the following components:   Chloride 97 (*)    Glucose, Bld 267 (*)    BUN 38 (*)    Creatinine, Ser 5.86 (*)    ALT 11 (*)    GFR calc non Af Amer 7 (*)    GFR calc Af Amer 8 (*)    Anion gap 18 (*)    All other components within normal limits  LIPASE, BLOOD - Abnormal; Notable for the following components:    Lipase 137 (*)    All other components within normal limits  TROPONIN I - Abnormal; Notable for the following components:   Troponin I 0.43 (*)    All other components within normal limits  PROTIME-INR  I-STAT CHEM 8, ED  I-STAT TROPONIN, ED  I-STAT CG4 LACTIC ACID, ED  I-STAT CG4 LACTIC ACID, ED    EKG  EKG Interpretation  Date/Time:  Sunday May 01 2017 06:31:28 EST Ventricular Rate:  93 PR Interval:    QRS Duration: 113 QT Interval:  373 QTC Calculation: 464 R Axis:   72 Text Interpretation:  Sinus or ectopic atrial rhythm Incomplete left bundle branch block LVH with secondary repolarization abnormality Anterior Q waves, possibly due to LVH No significant change was found Confirmed by Ezequiel Essex [phone]) on 05/01/2017 6:37:15 AM Also confirmed by Ezequiel Essex [phone]), editor Philomena Doheny [phone])  on 05/01/2017 8:51:18 AM       Radiology Dg Chest Portable 1 View  Result Date: 05/01/2017 CLINICAL DATA:  Chest pain EXAM: PORTABLE CHEST 1 VIEW COMPARISON:  April 05, 2017 FINDINGS: Central catheter tip is in the right atrium slightly beyond the cavoatrial junction. No pneumothorax. There is interstitial pulmonary edema with a small right pleural effusion. There is cardiomegaly with pulmonary venous hypertension. There is aortic atherosclerosis. Patient is status post median sternotomy. No evident adenopathy. There is postoperative change in each lower neck region. There is extensive carotid and axillary artery calcification. Temporary pacemaker wires are attached to the right heart. IMPRESSION: Interstitial pulmonary edema with pulmonary vascular congestion and small right pleural effusion. The appearance is felt to be indicative of congestive heart failure. No airspace consolidation. Central catheter as described without pneumothorax.  Temporary pacemaker wires attached to right heart. There is extensive vascular calcification with aortic atherosclerosis as well as extensive  bilateral carotid and axillary artery calcification. Aortic Atherosclerosis (ICD10-I70.0). Electronically Signed   By: Lowella Grip III M.D.   On: 05/01/2017 07:11    Procedures Procedures (including critical care time)  Medications Ordered in ED Medications  ondansetron (ZOFRAN) injection 4 mg (not administered)     Initial Impression / Assessment and Plan / ED Course  I have reviewed the triage vital signs and the nursing notes.  Pertinent labs & imaging results that were available during my care of the patient were reviewed by me and considered in my medical decision making (see chart for details).    Abdominal pain with nausea and vomiting similar to previous NSTEMI. EKG unchanged LVH with ST depressions. Denies chest pain. Hypertensive.   Edema on exam and Xray. States compliance with dialysis.   LHC last month with stenting of graft. Known LAD lesion was medically managed.  K 3.7. Troponin 0.5, likely still trending down. Denies chest pain. Lipase mildly elevated.  D/w Nephrology Dr. Hollie Salk who will arrange for dialysis today. CT will be obtained to further evaluate abdominal pain with elevated lipase. Hold heparin gtt until CT results.  Admission d/w [facility] residents.   CRITICAL CARE Performed by: Ezequiel Essex Total critical care time: 45 minutes Critical care time was exclusive of separately billable procedures and treating other patients. Critical care was necessary to treat or prevent imminent or life-threatening deterioration. Critical care was time spent personally by me on the following activities: development of treatment plan with patient and/or surrogate as well as nursing, discussions with consultants, evaluation of patient's response to treatment, examination of patient, obtaining history from patient or surrogate, ordering and performing treatments and interventions, ordering and review of laboratory studies, ordering and review of radiographic studies,  pulse oximetry and re-evaluation of patient's condition.  Final Clinical Impressions(s) / ED Diagnoses   Final diagnoses:  Hypertensive emergency  Non-intractable vomiting with nausea, unspecified vomiting type  Acute pulmonary edema [facility])    ED Discharge Orders    None       Ezequiel Essex, MD 05/01/17 [phone]

## 2017-05-01 NOTE — Progress Notes (Addendum)
Arrived to unit from Dialysis in stable condition. Oriented to room and unit. CHG bath given. Orders reviewed. Will continue to monitor.

## 2017-05-01 NOTE — Progress Notes (Signed)
Patient has right IF that continues to drain blood. Site cleaned.

## 2017-05-01 NOTE — Procedures (Signed)
  Central Venous Catheter Insertion Procedure Note Brandi Arellano [id number] [birth year]  Procedure: Insertion of Central Venous Catheter Indications: Drug and/or fluid administration  Procedure Details Consent: Risks of procedure as well as the alternatives and risks of each were explained to the (patient/caregiver).  Consent for procedure obtained. Time Out: Verified patient identification, verified procedure, site/side was marked, verified correct patient position, special equipment/implants available, medications/allergies/relevent history reviewed, required imaging and test results available.  Performed  Maximum sterile technique was used including antiseptics, cap, gloves, gown, hand hygiene, mask and sheet. Skin prep: Chlorhexidine; local anesthetic administered A antimicrobial bonded/coated triple lumen catheter was placed in the left internal jugular vein using the Seldinger technique. Ultrasound guidance used.Yes.   Catheter placed to 20 cm. Blood aspirated via all 3 ports and then flushed x 3. Line sutured x 2 and dressing applied.  Evaluation Blood flow good Complications: No apparent complications Patient did tolerate procedure well. Chest X-ray ordered to verify placement.  CXR: pending.  Richardson Landry Minor ACNP Maryanna Shape PCCM Pager [phone] till 1 pm If no answer page [phone] 05/01/2017, 3:51 PM

## 2017-05-01 NOTE — ED Notes (Signed)
THE PT REPORTS THAT SHE CANNOT TAKE ZOFRAN IT MAKES HER SICK

## 2017-05-01 NOTE — ED Notes (Signed)
Admitting to consult IR for IV placement.

## 2017-05-02 ENCOUNTER — Inpatient Hospital Stay (HOSPITAL_COMMUNITY): Payer: Medicaid Other

## 2017-05-02 DIAGNOSIS — I161 Hypertensive emergency: Secondary | ICD-10-CM

## 2017-05-02 DIAGNOSIS — E1122 Type 2 diabetes mellitus with diabetic chronic kidney disease: Secondary | ICD-10-CM

## 2017-05-02 DIAGNOSIS — Z794 Long term (current) use of insulin: Secondary | ICD-10-CM

## 2017-05-02 DIAGNOSIS — I214 Non-ST elevation (NSTEMI) myocardial infarction: Principal | ICD-10-CM

## 2017-05-02 DIAGNOSIS — R1013 Epigastric pain: Secondary | ICD-10-CM

## 2017-05-02 LAB — RENAL FUNCTION PANEL
ANION GAP: 12 (ref 5–15)
Albumin: 2.7 g/dL — ABNORMAL LOW (ref 3.5–5.0)
BUN: 29 mg/dL — ABNORMAL HIGH (ref 6–20)
CHLORIDE: 102 mmol/L (ref 101–111)
CO2: 27 mmol/L (ref 22–32)
Calcium: 8.4 mg/dL — ABNORMAL LOW (ref 8.9–10.3)
Creatinine, Ser: 5.07 mg/dL — ABNORMAL HIGH (ref 0.44–1.00)
GFR calc non Af Amer: 8 mL/min — ABNORMAL LOW (ref >60)
GFR, EST AFRICAN AMERICAN: 10 mL/min — AB (ref >60)
Glucose, Bld: 83 mg/dL (ref 65–99)
POTASSIUM: 4.1 mmol/L (ref 3.5–5.1)
Phosphorus: 5.1 mg/dL — ABNORMAL HIGH (ref 2.5–4.6)
Sodium: 141 mmol/L (ref 135–145)

## 2017-05-02 LAB — CBC
HEMATOCRIT: 34.9 % — AB (ref 36.0–46.0)
HEMOGLOBIN: 10.4 g/dL — AB (ref 12.0–15.0)
MCH: 29.5 pg (ref 26.0–34.0)
MCHC: 29.8 g/dL — ABNORMAL LOW (ref 30.0–36.0)
MCV: 98.9 fL (ref 78.0–100.0)
Platelets: 155 10*3/uL (ref 150–400)
RBC: 3.53 MIL/uL — ABNORMAL LOW (ref 3.87–5.11)
RDW: 18.4 % — AB (ref 11.5–15.5)
WBC: 6.8 10*3/uL (ref 4.0–10.5)

## 2017-05-02 LAB — GLUCOSE, CAPILLARY
GLUCOSE-CAPILLARY: 128 mg/dL — AB (ref 65–99)
GLUCOSE-CAPILLARY: 130 mg/dL — AB (ref 65–99)
GLUCOSE-CAPILLARY: 152 mg/dL — AB (ref 65–99)
GLUCOSE-CAPILLARY: 59 mg/dL — AB (ref 65–99)
Glucose-Capillary: 115 mg/dL — ABNORMAL HIGH (ref 65–99)
Glucose-Capillary: 229 mg/dL — ABNORMAL HIGH (ref 65–99)
Glucose-Capillary: 89 mg/dL (ref 65–99)

## 2017-05-02 LAB — TROPONIN I
TROPONIN I: 6.31 ng/mL — AB (ref <0.03)
TROPONIN I: 5.63 ng/mL — AB (ref <0.03)
Troponin I: 4.36 ng/mL (ref <0.03)

## 2017-05-02 LAB — T4, FREE: Free T4: 0.78 ng/dL (ref 0.61–1.12)

## 2017-05-02 LAB — CORTISOL: CORTISOL PLASMA: 13.1 ug/dL

## 2017-05-02 LAB — LACTIC ACID, PLASMA: Lactic Acid, Venous: 1.4 mmol/L (ref 0.5–1.9)

## 2017-05-02 LAB — HEPARIN LEVEL (UNFRACTIONATED)

## 2017-05-02 LAB — BRAIN NATRIURETIC PEPTIDE

## 2017-05-02 LAB — PROCALCITONIN: PROCALCITONIN: 0.6 ng/mL

## 2017-05-02 MED ORDER — HEPARIN (PORCINE) IN NACL 100-0.45 UNIT/ML-% IJ SOLN
950.0000 [IU]/h | INTRAMUSCULAR | Status: DC
Start: 2017-05-02 — End: 2017-05-03
  Administered 2017-05-02: 650 [IU]/h via INTRAVENOUS
  Administered 2017-05-03: 950 [IU]/h via INTRAVENOUS
  Filled 2017-05-02 (×2): qty 250

## 2017-05-02 MED ORDER — HEPARIN BOLUS VIA INFUSION
2000.0000 [IU] | Freq: Once | INTRAVENOUS | Status: AC
  Administered 2017-05-02: 2000 [IU] via INTRAVENOUS
  Filled 2017-05-02: qty 2000

## 2017-05-02 MED ORDER — DEXTROSE 50 % IV SOLN
25.0000 mL | Freq: Once | INTRAVENOUS | Status: AC
  Administered 2017-05-02: 25 mL via INTRAVENOUS

## 2017-05-02 MED ORDER — SODIUM CHLORIDE 0.9% FLUSH
10.0000 mL | Freq: Two times a day (BID) | INTRAVENOUS | Status: DC
  Administered 2017-05-02: 30 mL
  Administered 2017-05-05: 10 mL

## 2017-05-02 MED ORDER — DEXTROSE 50 % IV SOLN
INTRAVENOUS | Status: AC
  Filled 2017-05-02: qty 50

## 2017-05-02 NOTE — Progress Notes (Signed)
RRT called concerning decline in patient status and BP. MD in and brother on call.

## 2017-05-02 NOTE — Progress Notes (Signed)
FPTS Interim Progress Note  Responded to rapid response page for dropping BP to SBP 70-80s despite 700cc bolus given. Per RN, patient appears tired on 13L Venturi mask. Repeat CXR shows worsening pulmonary congestion. Concern for further fluid overload. Patient mentating well on exam. Spoke with brother/POA and patient who are in agreement and wanting aggressive measures. Will consult CCM for recommendations.   Rory Percy, DO 05/02/2017, 2:06 AM PGY-1, [place] Medicine Service pager [phone]

## 2017-05-02 NOTE — Consult Note (Signed)
Name: Brandi Arellano MRN: [id number] DOB: September 19, 1955    ADMISSION DATE:  05/01/2017 CONSULTATION DATE:  05/02/2017  REFERRING MD :  Dr. 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 Lipase 137. Troponin 0.43. CXR with pulmonary edema and vascular congestion with small right pleural effusion. CT A/P with known bladder mass, no acute process. Admitted to Stepdown with UTI vs gastritis vs MI. On 1/21 patient with hypotension, systolic 68-34. Given 700 ml bolus without improvement. PCCM asked to consult.   Troponin 6.31. WBC 6.8. Afebrile. Lactic Acid 1.4   SIGNIFICANT EVENTS  1/20 > Presents to ED   STUDIES:  CT A/P 1/20 > The known bladder mass deviates the Foley catheter to the left. Bilateral pleural effusions with atelectasis. Dense atherosclerosis in the aorta and branching vessels. No other significant abnormalities. CXR 1/20 > Increasing RIGHT upper lobe airspace opacities suggesting pulmonary edema. Introduction of LEFT central venous line without pneumothorax.  PAST MEDICAL HISTORY :   has a past medical history of Anemia, Bladder mass (02/09/2017), Bladder tumor (03/01/2017), Buerger's disease ([place]) (02/08/2014), CAD (coronary artery disease), CHF (congestive heart failure) ([place]), Complication of anesthesia, Conjunctivitis (08/04/2015), Diabetes mellitus without complication ([place]), DM type 2, uncontrolled, with renal complications ([place]) (1/96/2229), Dupuytren's contracture of left hand (05/07/2016), DVT (deep venous thrombosis) ([place]), ESRD (end stage renal disease) ([place]), ESRD needing dialysis ([place]) (12/14/2013), Gangrene associated with diabetes mellitus ([place]) (03/20/2014), Gangrene of lower extremity ([place]),  GERD (gastroesophageal reflux disease), AKA (above knee amputation), right ([place]), Hyperlipidemia, Hypertension, Hypothyroidism, Insomnia (07/09/2015), 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  has a past surgical history that includes Coronary artery bypass graft (2012); Cholecystectomy; Carotid endarterectomy; Foot Amputation (Bilateral); Amputation (Right, 02/01/2014); lower extremity angiogram (N/A, 12/26/2013); abdominal angiogram (12/26/2013); Upper extremity angiogram (Right, 12/26/2013); arch aortogram (12/26/2013); Amputation (Right, 03/24/2014); Hardware Removal (Right, 07/19/2014); hemodialysis catheter; Insertion of dialysis catheter (Right, 02/07/2017); Axillary artery - brachial artery bypass graft (11/07/2013); LEFT HEART CATH AND CORS/GRAFTS ANGIOGRAPHY (N/A, 02/11/2017); Transurethral resection of bladder tumor (N/A, 03/01/2017); Cystoscopy w/ retrogrades (Left, 03/01/2017); LEFT HEART CATH AND CORONARY ANGIOGRAPHY (N/A, 04/06/2017); and CORONARY STENT INTERVENTION (N/A, 04/07/2017). Prior to Admission medications   Medication Sig Start Date End Date Taking? Authorizing Provider  amLODipine (NORVASC) 10 MG tablet Take 10 mg by mouth daily with breakfast.   Yes [provider]  aspirin EC 81 MG tablet Take 81 mg by mouth daily with breakfast.    Yes [provider]  atorvastatin (LIPITOR) 40 MG tablet Take 40 mg by mouth at bedtime.    Yes [provider]  clopidogrel (PLAVIX) 75 MG tablet Take 1 tablet (75 mg total) by mouth daily with breakfast. 04/12/17  Yes [place] Bing, DO  lanthanum (FOSRENOL) 1000 MG chewable tablet Chew 1,000 mg by mouth See admin instructions. Crush and  sprinkle over food one tablet (1000 mg) three times daily with meals   Yes [provider]  levothyroxine (SYNTHROID, LEVOTHROID) 25 MCG tablet Take 25 mcg by mouth  daily with breakfast.    Yes [provider]  metoprolol tartrate (LOPRESSOR) 25 MG tablet Take 1 tablet (25 mg total) by mouth 2 (two) times daily. 03/15/17  Yes Enid Derry, [place], DO  senna-docusate (SENOKOT-S) 8.6-50 MG tablet Take 1 tablet by mouth at bedtime as needed for mild constipation. 03/15/17  Yes Enid Derry, [place], DO  thiamine 100 MG tablet Take 1 tablet (100 mg total) by mouth daily. Patient taking differently: Take 100 mg by mouth daily with breakfast.  03/16/17  Yes Enid Derry, [place], DO  citalopram (CELEXA) 20 MG tablet Take 1 tablet (20 mg total) by mouth daily. Patient not taking: Reported on 05/01/2017 04/12/17   Hico Bing, DO   Allergies  Allergen Reactions  . Diphenhydramine Nausea And Vomiting  . Tramadol Hcl Nausea And Vomiting  . Morphine And Related Nausea And Vomiting    FAMILY HISTORY:  family history includes Heart disease in her father; Hypertension in her father. She was adopted. SOCIAL HISTORY:  reports that she has quit smoking. Her smoking use included cigarettes. She quit after 30.00 years of use. she has never used smokeless tobacco. She reports that she does not drink alcohol or use drugs.  REVIEW OF SYSTEMS:   All negative; except for those that are bolded, which indicate positives.  Constitutional: weight loss, weight gain, night sweats, fevers, chills, fatigue, weakness.  HEENT: headaches, sore throat, sneezing, nasal congestion, post nasal drip, difficulty swallowing, tooth/dental problems, visual complaints, visual changes, ear aches. Neuro: difficulty with speech, weakness, numbness, ataxia. CV:  chest pain, orthopnea, PND, swelling in lower extremities, dizziness, palpitations, syncope.  Resp: cough, hemoptysis, dyspnea, wheezing. GI: heartburn, indigestion, abdominal  pain, nausea, vomiting, diarrhea, constipation, change in bowel habits, loss of appetite, hematemesis, melena, hematochezia.  GU: dysuria, change in color of urine, urgency or frequency, flank pain, hematuria. MSK: joint pain or swelling, decreased range of motion. Psych: change in mood or affect, depression, anxiety, suicidal ideations, homicidal ideations. Skin: rash, itching, bruising.  SUBJECTIVE:   VITAL SIGNS: Temp:  [97.6 F (36.4 C)-98.5 F (36.9 C)] 98.1 F (36.7 C) (01/21 0301) Pulse Rate:  [63-130] 63 (01/21 0400) Resp:  [12-28] 15 (01/21 0400) BP: (73-203)/(47-108) 101/60 (01/21 0400) SpO2:  [87 %-100 %] 96 % (01/21 0400) FiO2 (%):  [98 %] 98 % (01/21 0136) Weight:  [53.5 kg (118 lb)] 53.5 kg (118 lb) (01/20 4696)  PHYSICAL EXAMINATION: General:  Elderly female, no distress  Neuro:  Alert, oriented, follows commands  HEENT:  Normocephalic  Cardiovascular:  RRR, no MRG  Lungs:  Clear breath sounds, no wheeze/crackles  Abdomen:  Non-distended, active bowel sounds Musculoskeletal:  Left BKA, RIJ HD cath  Skin:  Warm, dry, intact   Recent Labs  Lab 05/01/17 0640 05/02/17 0153  NA 138 141  K 3.7 4.1  CL 97* 102  CO2 23 27  BUN 38* 29*  CREATININE 5.86* 5.07*  GLUCOSE 267* 83   Recent Labs  Lab 05/01/17 0640 05/02/17 0008  HGB 11.8* 10.4*  HCT 36.9 34.9*  WBC 9.0 6.8  PLT 173 155   Ct Abdomen Pelvis Wo Contrast  Result Date: 05/01/2017 CLINICAL DATA:  Pain with nausea and vomiting. EXAM: CT ABDOMEN AND PELVIS WITHOUT CONTRAST TECHNIQUE: Multidetector CT imaging of the abdomen and pelvis was performed following the standard protocol without IV contrast. COMPARISON:  February 06, 2017 FINDINGS: Lower chest: Bilateral pleural effusions with associated atelectasis identified. Small hiatal hernia. Hepatobiliary: Previous cholecystectomy. The liver is otherwise unremarkable. Pancreas: Unremarkable. No  pancreatic ductal dilatation or surrounding inflammatory  changes. Spleen: Normal in size without focal abnormality. Adrenals/Urinary Tract: Adrenal glands are normal. Significant vascular calcifications in both mildly atrophic kidneys. No hydronephrosis or ureterectasis. No ureteral stone. There is a Foley catheter in the bladder. The previously identified bladder mass deviates the catheter to the left. The bladder is thick walled as well which is stable to mildly improved. Stomach/Bowel: The stomach and small bowel are normal. The colon is unremarkable. The appendix is not visualized but there is no secondary evidence of appendicitis. Vascular/Lymphatic: Dense atherosclerotic changes in the nonaneurysmal aorta, branching vessels, iliac vessels, and femoral vessels. No adenopathy identified. Reproductive: Uterus and bilateral adnexa are unremarkable. Other: No free air or free fluid. Musculoskeletal: No acute or significant osseous findings. IMPRESSION: 1. The known bladder mass deviates the Foley catheter to the left. 2. Bilateral pleural effusions with atelectasis. 3. Dense atherosclerosis in the aorta and branching vessels. 4. No other significant abnormalities. Electronically Signed   By: Dorise Bullion III M.D   On: 05/01/2017 21:09   Dg Chest Portable 1 View  Result Date: 05/02/2017 CLINICAL DATA:  Central line placement EXAM: PORTABLE CHEST 1 VIEW COMPARISON:  05/01/2017 FINDINGS: Right central venous catheter with tip in the right atrium, similar to position of previous study. Left central venous catheter with tip over the mid SVC region, slightly advanced since previous study. No pneumothorax. Postoperative changes in the mediastinum. Shallow inspiration. Heart size and pulmonary vascularity are normal. Increasing infiltration in the right mid and lower lung with decreased lung volume on the right consistent with progressing pneumonia. Small right pleural effusion is developing. Prominent vascular calcifications including aorta, bilateral subclavian and  axillary arteries, and bilateral common carotid artery. Surgical clips in the neck. Vascular stent in the left upper mediastinum. IMPRESSION: Appliances appear in satisfactory position. Progression of consolidation and development of small pleural effusion in the right mid and lower lung likely representing progressing pneumonia. Prominent aortic atherosclerosis. Electronically Signed   By: Lucienne Capers M.D.   On: 05/02/2017 00:43   Dg Chest Portable 1 View  Result Date: 05/01/2017 CLINICAL DATA:  central line placement EXAM: PORTABLE CHEST 1 VIEW COMPARISON:  05/01/2017 FINDINGS: RIGHT central venous line unchanged. New LEFT central venous line tip in the mid SVC. Stable cardiac silhouette. Increased RIGHT upper lobe airspace opacity. Lung bases clear. IMPRESSION: 1. Increasing RIGHT upper lobe airspace opacities suggesting pulmonary edema. 2. Introduction of LEFT central venous line without pneumothorax. Electronically Signed   By: Suzy Bouchard M.D.   On: 05/01/2017 15:51   Dg Chest Portable 1 View  Result Date: 05/01/2017 CLINICAL DATA:  Chest pain EXAM: PORTABLE CHEST 1 VIEW COMPARISON:  April 05, 2017 FINDINGS: Central catheter tip is in the right atrium slightly beyond the cavoatrial junction. No pneumothorax. There is interstitial pulmonary edema with a small right pleural effusion. There is cardiomegaly with pulmonary venous hypertension. There is aortic atherosclerosis. Patient is status post median sternotomy. No evident adenopathy. There is postoperative change in each lower neck region. There is extensive carotid and axillary artery calcification. Temporary pacemaker wires are attached to the right heart. IMPRESSION: Interstitial pulmonary edema with pulmonary vascular congestion and small right pleural effusion. The appearance is felt to be indicative of congestive heart failure. No airspace consolidation. Central catheter as described without pneumothorax. Temporary pacemaker wires  attached to right heart. There is extensive vascular calcification with aortic atherosclerosis as well as extensive bilateral carotid and axillary artery calcification. Aortic Atherosclerosis (ICD10-I70.0). Electronically  Signed   By: Lowella Grip III M.D.   On: 05/01/2017 07:11    ASSESSMENT / PLAN:  Acute Hypoxic Respiratory Failure in setting of decompensated heart failure  CXR with RIGHT upper lobe airspace opacities Plan  -Maintain Oxygen Saturation >90 - upon assessment patient is on 13L [place] Mask with Oxygen Saturation 100% and no distress, while in room patient was weaned to 4L [place] with oxygen saturation 96%  -Trend CXR   Hypotension in setting of sepsis vs hypoperfusion H/O HTN  Plan  -LA 1.4, Temp 98.4 Plan  -Cardiac Monitoring -Maintain MAP >65 - after 700 ml fluid bolus patient systolic 354-656, mentating well  -cortisol level pending (if low could consider stress-dose steroids)  Elevated Troponin in setting of NSTEMI Troponin 0.43 > 4.52 > 6.31  Plan  -Per Cariology  -Bleeding to CVC has stopped, will resume Heparin gtt -Trend Troponin  -Continue Plavix and ASA -D/C Metoprolol and Norvasc    At this time patient is okay to stay in step-down under [facility] Medicine Teaching service. Please call back if condition were to change.   Hayden Pedro, AGACNP-BC [facility]  Pgr: [phone]  PCCM Pgr: [phone]

## 2017-05-02 NOTE — Progress Notes (Signed)
Family medicine called back an ordered to give IV bolus due to drop in BP. Family practice said the RIJ can be used if blood return is noted. But nurse and IV team is not comfortable doing it based on the status of the site.

## 2017-05-02 NOTE — Progress Notes (Signed)
[facility] Progress Note  Subjective: SOB and abd pain better, had HD last night  Vitals:   05/02/17 0301 05/02/17 0400 05/02/17 0610 05/02/17 0830  BP: 115/65 101/60  (!) 149/67  Pulse: 63 63  65  Resp: 15 15  18   Temp: 98.1 F (36.7 C)     TempSrc: Oral   Oral  SpO2: 96% 96%  97%  Weight:   55.8 kg (123 lb 0.3 oz)   Height:        Inpatient medications: . aspirin EC  81 mg Oral Q breakfast  . atorvastatin  40 mg Oral QHS  . clopidogrel  75 mg Oral Q breakfast  . insulin aspart  0-9 Units Subcutaneous Q4H  . lanthanum  1,000 mg Oral TID WC  . levothyroxine  25 mcg Oral QAC breakfast  . sodium chloride flush  10-40 mL Intracatheter Q12H  . thiamine  100 mg Oral Q breakfast  . THROMBI-PAD  1 each Topical Once   . sodium chloride Stopped (05/01/17 2151)  . heparin 650 Units/hr (05/02/17 0357)  . piperacillin-tazobactam (ZOSYN)  IV 3.375 g (05/02/17 0959)   metoCLOPramide **OR** metoCLOPramide (REGLAN) injection, oxyCODONE, polyethylene glycol, senna-docusate  Exam: Alert, nasal O2 No jvd Chest clear bilat RRR 3/6 sem Abd soft ntnd GU indwell foley Ext no edema NF, OX 3 TDC R chest  Dialysis: MWF [facility] 4h   54kg   2/2.25  Linear Na UF 4   TDC  Hep 4000 - Venofer 50mg  IV weekly - Hectoral 10mcg IV q HD - Mircera 249mcg q 2 weeks - home BP Rx norvasc 10, metoprolol 25 bid   Impression: 1  N/V better 2  ESRD ^ vol , better after HD 3  Pulm edema - better clinically after HD yest 4  DM 5  Anemia no esa for now 6  Bladder Ca 7  CAD  Plan - HD today to get back on schedule   Brandi Splinter MD [facility] pager [phone]   05/02/2017, 1:15 PM   Recent Labs  Lab 05/01/17 0640 05/02/17 0153  NA 138 141  K 3.7 4.1  CL 97* 102  CO2 23 27  GLUCOSE 267* 83  BUN 38* 29*  CREATININE 5.86* 5.07*  CALCIUM 9.5 8.4*  PHOS  --  5.1*   Recent Labs  Lab 05/01/17 0640 05/02/17 0153  AST 20  --   ALT 11*  --   ALKPHOS 113  --    BILITOT 0.6  --   PROT 7.9  --   ALBUMIN 3.5 2.7*   Recent Labs  Lab 05/01/17 0640 05/02/17 0008  WBC 9.0 6.8  NEUTROABS 7.6  --   HGB 11.8* 10.4*  HCT 36.9 34.9*  MCV 99.2 98.9  PLT 173 155   Iron/TIBC/Ferritin/ %Sat    Component Value Date/Time   IRON 123 02/16/2017 0900   TIBC 200 (L) 02/16/2017 0900   FERRITIN 1,388 (H) 02/16/2017 0900   IRONPCTSAT 61 (H) 02/16/2017 0900

## 2017-05-02 NOTE — Progress Notes (Signed)
ANTICOAGULATION CONSULT NOTE - Initial Consult  Pharmacy Consult for Heparin Indication: chest pain/ACS  Allergies  Allergen Reactions  . Diphenhydramine Nausea And Vomiting  . Tramadol Hcl Nausea And Vomiting  . Morphine And Related Nausea And Vomiting    Patient Measurements: Height: 5\' 5"  (165.1 cm) Weight: (refused standing wt.) IBW/kg (Calculated) : 57  Vital Signs: Temp: 98.1 F (36.7 C) (01/21 0301) Temp Source: Oral (01/21 0301) BP: 115/65 (01/21 0301) Pulse Rate: 63 (01/21 0301)  Labs: Recent Labs    05/01/17 0640 05/01/17 1745 05/01/17 2217 05/02/17 0008 05/02/17 0153  HGB 11.8*  --   --  10.4*  --   HCT 36.9  --   --  34.9*  --   PLT 173  --   --  155  --   LABPROT 12.9  --   --   --   --   INR 0.98  --   --   --   --   CREATININE 5.86*  --   --   --  5.07*  CKTOTAL  --  85  --   --   --   CKMB  --  7.8*  --   --   --   TROPONINI 0.43*  --  4.52*  --  6.31*    Estimated Creatinine Clearance: 10 mL/min (A) (by C-G formula based on SCr of 5.07 mg/dL (H)).   Medical History: Past Medical History:  Diagnosis Date  . Anemia   . Bladder mass 02/09/2017  . Bladder tumor 03/01/2017  . Buerger's disease ([place]) 02/08/2014  . CAD (coronary artery disease)    CABG  . CHF (congestive heart failure) (H[facility])    Acute on chronic diastolic  . Complication of anesthesia   . Conjunctivitis 08/04/2015  . Diabetes mellitus without complication ([place])    Type II  . DM type 2, uncontrolled, with renal complications ([place]) 1/60/7371  . Dupuytren's contracture of left hand 05/07/2016  . DVT (deep venous thrombosis) ([place])   . ESRD (end stage renal disease) ([place])    HD M-W-F  . ESRD needing dialysis ([place]) 12/14/2013  . Gangrene associated with diabetes mellitus ([place]) 03/20/2014  . Gangrene of lower extremity (H[facility])    RLE  . GERD (gastroesophageal reflux disease)   . Hx of AKA (above knee amputation), right ([place])   . Hyperlipidemia   . Hypertension   . Hypothyroidism    . Insomnia 07/09/2015  . 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 PONV (postoperative nausea and vomiting)   . S/P dialysis catheter insertion ([place])   . Stable angina pectoris ([place])   . Status post above knee amputation, right ([place]) 02/08/2014  . Stroke [facility])    "light stroke" no deficits  . Thrombocytopenia ([place])   . Thyroid disease    Abnormal Thyroid function Test  . UTI (lower urinary tract infection)     Medications:  Medications Prior to Admission  Medication Sig Dispense Refill Last Dose  . amLODipine (NORVASC) 10 MG tablet Take 10 mg by mouth daily with breakfast.   04/30/2017 at Unknown time  . aspirin EC 81 MG tablet Take 81 mg by mouth daily with breakfast.    05/01/2017 at Unknown time  . atorvastatin (LIPITOR) 40 MG tablet Take 40 mg by mouth at bedtime.    04/30/2017 at Unknown time  . clopidogrel (PLAVIX) 75 MG tablet Take 1 tablet (75 mg total) by mouth daily with breakfast. 30 tablet  0 04/30/2017 at Unknown time  . lanthanum (FOSRENOL) 1000 MG chewable tablet Chew 1,000 mg by mouth See admin instructions. Crush and sprinkle over food one tablet (1000 mg) three times daily with meals   04/30/2017 at Unknown time  . levothyroxine (SYNTHROID, LEVOTHROID) 25 MCG tablet Take 25 mcg by mouth daily with breakfast.    04/30/2017 at Unknown time  . metoprolol tartrate (LOPRESSOR) 25 MG tablet Take 1 tablet (25 mg total) by mouth 2 (two) times daily. 60 tablet 0 04/30/2017 at 20:00  . senna-docusate (SENOKOT-S) 8.6-50 MG tablet Take 1 tablet by mouth at bedtime as needed for mild constipation. 30 tablet 0 04/30/2017 at Unknown time  . thiamine 100 MG tablet Take 1 tablet (100 mg total) by mouth daily. (Patient taking differently: Take 100 mg by mouth daily with breakfast. ) 30 tablet 0 04/30/2017 at Unknown time  . citalopram (CELEXA) 20 MG tablet Take 1 tablet (20 mg total) by mouth daily. (Patient not taking: Reported on 05/01/2017) 30 tablet 0 Not Taking at  Unknown time    Assessment: 62 y.o. female with elevated troponin for heparin Goal of Therapy:  Heparin level 0.3-0.7 units/ml Monitor platelets by anticoagulation protocol: Yes   Plan:  Heparin 2000 units IV bolus, then start heparin 650 units/hr Check heparin level in 8 hours.   Caryl Pina 05/02/2017,3:26 AM

## 2017-05-02 NOTE — Progress Notes (Addendum)
The patient has been seen in conjunction with Reino Bellis, NP-C. All aspects of care have been considered and discussed. The patient has been personally interviewed, examined, and all clinical data has been reviewed.   Difficult clinical situation.  Nausea and vomiting which likely represent either intestinal or myocardial ischemia was present on admission along with pulmonary edema.  All responded to fluid removal with dialysis.  Significant enzyme bump with troponin greater than 5.    Images from most recent coronary intervention and catheterization demonstrates stent to the ostium of the LIMA to LAD and severe diffuse native vessel disease beyond graft insertion sites.  He also has moderate aortic stenosis.  Suspect that enzyme elevation is related to heart failure, elevated blood pressure, moderate aortic stenosis, subsequent hypotension on dialysis, and the severe diffuse nature of the patient's native coronary disease beyond graft insertion sites.  The native disease is not treatable with PCI or repeat surgery.  My best recommendation at this time is medical therapy guarding against excessive anemia and uncontrolled hypertension.  I do not believe repeat coronary catheterization/intervention is indicated unless other clinical problems develop which raise a question about LIMA stent patency.  Continue current therapy.  Overall prognosis in the setting is poor.  Progress Note  Patient Name: Brandi Arellano Date of Encounter: 05/02/2017  Primary Cardiologist: Radford Pax  Subjective   No chest pain or abd pain this morning.   Inpatient Medications    Scheduled Meds: . aspirin EC  81 mg Oral Q breakfast  . atorvastatin  40 mg Oral QHS  . clopidogrel  75 mg Oral Q breakfast  . insulin aspart  0-9 Units Subcutaneous Q4H  . lanthanum  1,000 mg Oral TID WC  . levothyroxine  25 mcg Oral QAC breakfast  . ondansetron  8 mg Oral Q8H  . thiamine  100 mg Oral Q breakfast  . THROMBI-PAD   1 each Topical Once   Continuous Infusions: . sodium chloride Stopped (05/01/17 2151)  . heparin 650 Units/hr (05/02/17 0357)  . piperacillin-tazobactam (ZOSYN)  IV Stopped (05/02/17 0306)   PRN Meds: metoCLOPramide **OR** metoCLOPramide (REGLAN) injection, oxyCODONE, polyethylene glycol, senna-docusate   Vital Signs    Vitals:   05/02/17 0301 05/02/17 0400 05/02/17 0610 05/02/17 0830  BP: 115/65 101/60  (!) 149/67  Pulse: 63 63  65  Resp: 15 15  18   Temp: 98.1 F (36.7 C)     TempSrc: Oral   Oral  SpO2: 96% 96%  97%  Weight:   123 lb 0.3 oz (55.8 kg)   Height:        Intake/Output Summary (Last 24 hours) at 05/02/2017 6237 Last data filed at 05/01/2017 2230 Gross per 24 hour  Intake -  Output 3000 ml  Net -3000 ml   Filed Weights   05/01/17 0652 05/02/17 0610  Weight: 118 lb (53.5 kg) 123 lb 0.3 oz (55.8 kg)    Telemetry    SR - Personally Reviewed  ECG    SR with diffuse TWI, deeper in lateral leads - Personally Reviewed  Physical Exam   General: Chronically ill older AA female appearing in no acute distress. Head: Normocephalic, atraumatic.  Neck: Supple,JVD. Lungs:  Resp regular and unlabored, CTA. Heart: RRR, S1, S2, no S3, S4, harsh systolic murmur; no rub. Abdomen: Soft, non-tender, non-distended with normoactive bowel sounds. Extremities: No clubbing, cyanosis, edema. Neuro: Alert and oriented X 3. Moves all extremities spontaneously. Psych: Normal affect.  Labs    Chemistry Recent Labs  Lab 05/01/17 0640 05/02/17 0153  NA 138 141  K 3.7 4.1  CL 97* 102  CO2 23 27  GLUCOSE 267* 83  BUN 38* 29*  CREATININE 5.86* 5.07*  CALCIUM 9.5 8.4*  PROT 7.9  --   ALBUMIN 3.5 2.7*  AST 20  --   ALT 11*  --   ALKPHOS 113  --   BILITOT 0.6  --   GFRNONAA 7* 8*  GFRAA 8* 10*  ANIONGAP 18* 12     Hematology Recent Labs  Lab 05/01/17 0640 05/02/17 0008  WBC 9.0 6.8  RBC 3.72* 3.53*  HGB 11.8* 10.4*  HCT 36.9 34.9*  MCV 99.2 98.9  MCH  31.7 29.5  MCHC 32.0 29.8*  RDW 17.8* 18.4*  PLT 173 155    Cardiac Enzymes Recent Labs  Lab 05/01/17 0640 05/01/17 2217 05/02/17 0153  TROPONINI 0.43* 4.52* 6.31*   No results for input(s): TROPIPOC in the last 168 hours.   BNP Recent Labs  Lab 05/02/17 0246  BNP >4,500.0*     DDimer No results for input(s): DDIMER in the last 168 hours.    Radiology    Ct Abdomen Pelvis Wo Contrast  Result Date: 05/01/2017 CLINICAL DATA:  Pain with nausea and vomiting. EXAM: CT ABDOMEN AND PELVIS WITHOUT CONTRAST TECHNIQUE: Multidetector CT imaging of the abdomen and pelvis was performed following the standard protocol without IV contrast. COMPARISON:  February 06, 2017 FINDINGS: Lower chest: Bilateral pleural effusions with associated atelectasis identified. Small hiatal hernia. Hepatobiliary: Previous cholecystectomy. The liver is otherwise unremarkable. Pancreas: Unremarkable. No pancreatic ductal dilatation or surrounding inflammatory changes. Spleen: Normal in size without focal abnormality. Adrenals/Urinary Tract: Adrenal glands are normal. Significant vascular calcifications in both mildly atrophic kidneys. No hydronephrosis or ureterectasis. No ureteral stone. There is a Foley catheter in the bladder. The previously identified bladder mass deviates the catheter to the left. The bladder is thick walled as well which is stable to mildly improved. Stomach/Bowel: The stomach and small bowel are normal. The colon is unremarkable. The appendix is not visualized but there is no secondary evidence of appendicitis. Vascular/Lymphatic: Dense atherosclerotic changes in the nonaneurysmal aorta, branching vessels, iliac vessels, and femoral vessels. No adenopathy identified. Reproductive: Uterus and bilateral adnexa are unremarkable. Other: No free air or free fluid. Musculoskeletal: No acute or significant osseous findings. IMPRESSION: 1. The known bladder mass deviates the Foley catheter to the left. 2.  Bilateral pleural effusions with atelectasis. 3. Dense atherosclerosis in the aorta and branching vessels. 4. No other significant abnormalities. Electronically Signed   By: Dorise Bullion III M.D   On: 05/01/2017 21:09   Dg Chest Portable 1 View  Result Date: 05/02/2017 CLINICAL DATA:  Central line placement EXAM: PORTABLE CHEST 1 VIEW COMPARISON:  05/01/2017 FINDINGS: Right central venous catheter with tip in the right atrium, similar to position of previous study. Left central venous catheter with tip over the mid SVC region, slightly advanced since previous study. No pneumothorax. Postoperative changes in the mediastinum. Shallow inspiration. Heart size and pulmonary vascularity are normal. Increasing infiltration in the right mid and lower lung with decreased lung volume on the right consistent with progressing pneumonia. Small right pleural effusion is developing. Prominent vascular calcifications including aorta, bilateral subclavian and axillary arteries, and bilateral common carotid artery. Surgical clips in the neck. Vascular stent in the left upper mediastinum. IMPRESSION: Appliances appear in satisfactory position. Progression of consolidation and development of small pleural effusion in the right mid and lower lung likely  representing progressing pneumonia. Prominent aortic atherosclerosis. Electronically Signed   By: Lucienne Capers M.D.   On: 05/02/2017 00:43   Dg Chest Portable 1 View  Result Date: 05/01/2017 CLINICAL DATA:  central line placement EXAM: PORTABLE CHEST 1 VIEW COMPARISON:  05/01/2017 FINDINGS: RIGHT central venous line unchanged. New LEFT central venous line tip in the mid SVC. Stable cardiac silhouette. Increased RIGHT upper lobe airspace opacity. Lung bases clear. IMPRESSION: 1. Increasing RIGHT upper lobe airspace opacities suggesting pulmonary edema. 2. Introduction of LEFT central venous line without pneumothorax. Electronically Signed   By: Suzy Bouchard M.D.   On:  05/01/2017 15:51   Dg Chest Portable 1 View  Result Date: 05/01/2017 CLINICAL DATA:  Chest pain EXAM: PORTABLE CHEST 1 VIEW COMPARISON:  April 05, 2017 FINDINGS: Central catheter tip is in the right atrium slightly beyond the cavoatrial junction. No pneumothorax. There is interstitial pulmonary edema with a small right pleural effusion. There is cardiomegaly with pulmonary venous hypertension. There is aortic atherosclerosis. Patient is status post median sternotomy. No evident adenopathy. There is postoperative change in each lower neck region. There is extensive carotid and axillary artery calcification. Temporary pacemaker wires are attached to the right heart. IMPRESSION: Interstitial pulmonary edema with pulmonary vascular congestion and small right pleural effusion. The appearance is felt to be indicative of congestive heart failure. No airspace consolidation. Central catheter as described without pneumothorax. Temporary pacemaker wires attached to right heart. There is extensive vascular calcification with aortic atherosclerosis as well as extensive bilateral carotid and axillary artery calcification. Aortic Atherosclerosis (ICD10-I70.0). Electronically Signed   By: Lowella Grip III M.D.   On: 05/01/2017 07:11    Cardiac Studies   CATH:  04/06/17  Ost RCA to Prox RCA lesion is 100% stenosed.  Mid RCA to Dist RCA lesion is 100% stenosed with 100% stenosed side branch in Acute Mrg.  Prox Cx to Mid Cx lesion is 100% stenosed with 100% stenosed side branch in Ost 2nd Mrg.  Ost LAD to Prox LAD lesion is 80% stenosed.  Prox LAD lesion is 90% stenosed.  Mid LAD-1 lesion is 90% stenosed.  Mid LAD-2 lesion is 95% stenosed.  Origin lesion is 80% stenosed.  Ost 1st Diag lesion is 50% stenosed.  Severe native CAD with calcification and evidence for probable 80% calcified stenoses in the very proximal LAD prior to a moderate size bifurcating diagonal vessel with 50% narrowing in the  diagonal vessel and 80% in the distal superior branch of this vessel, diffuse 80-90% and 95% multiple stenoses in a severely calcified mid LAD prior to mid-distal diagonal vessel with the LIMA graft inserting beyond this diagonal vessel; total occlusion of the right proximal circumflex after a diminutive first marginal branch, and total occlusion of the RCA at its ostium.  Subclavian stent with mild luminal irregularity.  80% eccentric ostial stenosis in the LIMA which supplies the distal LAD after mid-distal diagonal vessel.  Patent vein graft supplying the distal marginal vessel of the circumflex coronary artery.  Patent vein graft supplying the distal RCA. There are septal collaterals from the distal RCA towards the LAD.   RECOMMENDATION: Angiographic findings were reviewed in detail with both Drs. Skains and [employer]. It appears that the ostial stenosis of the LIMA graft is tighter than previously thought and is at least 80%. There is also concern that the very proximal LAD which is calcified and at least 80% stenoses jeopardizing a large diagonal vessel, which is not supplied by the LIMA graft. The  patient will undergo dialysis later today. Discussion was made concerning possible stage intervention to the LIMA ostium and staged atherectomy to the very proximal LAD before the diagonal vessel with medical therapy for the diffuse stenoses beyond the diagonal vessel in the mid LAD.  Staged PCI 04/07/17:  RECOMMENDATION: The patient will continue with DAPT. Medical therapy for significant concomitant CAD. If she continues to experience symptomatology, consider atherectomy of the proximal LAD prior to a large first diagonal takeoff.  Patient Profile     62 y.o. female with a hx of remote CABG, CAD with NSTEMI on 04/07/17 s/p PTCA and DES to ostial LIMA graft with recommendationS for medical therapy for residual disease, borderline HFpEF w/ EF 40-45%, ESRD on HD, PVDs/p right BKA, HTN,  HLD, DMand recent diagnosis of bladder cancer, s/p surgical resectionof tumor who presented with abd pain, and positive troponins.    Assessment & Plan    1.NSTEMI: peak 6.31 this morning. Denies chest pain or abd pain this morning. Reports she did not have significant chest pain with last admission when she underwent previous stenting. Given elevation in troponins she was started on IV heparin. Previous notes indicate to consider atherectomy of the pLAD prior to 1st diag. Given multiple co-morbidites not sure that she is a candidate for repeat cath at this time. Best option may be for medical therapy moving forward. But given troponin increase would consider re-imaging. Will discuss with MD.   2. ESRD: -Per IM -HD MWF  3. Abdominal pain: -Per IM -CT abdomen negative for bleeding. These symptoms could be her anginal symptoms as she reports the same with her previous admission requiring stenting to LIMA -LAD  4. Systolic HF: -Echo from last admission, EF 40-45% -Daily weights>> per HD -HD to correct fluid volume overload, weight to up today, but does not have signs of significant volume overload on exam.   5. Right pleural effusion: -CXR completed in ED - underwent HD yesterday, O2 now weaned to [facility].  -Per IM  6. Hypertension: improved today. Would continue with current therapy.   7. Anemia: Has required transfusions in the past. H/H stable this admission, though did report several episodes of vomiting prior to admission that was dark in nature. CT was neg.   Signed, Reino Bellis, NP  05/02/2017, 9:28 AM  Pager # [phone]   For questions or updates, please contact [place] Please consult [email or web address] for contact info under Cardiology/STEMI.

## 2017-05-02 NOTE — Progress Notes (Signed)
Family Medicine Teaching Service Daily Progress Note Intern Pager: [phone]  Patient name: Brandi Arellano Medical record number: [id number] Date of birth: 27-Jan-1956 Age: 62 y.o. Gender: female  Primary Care Provider: Kerin Perna, NP Consultants: CCM, Cards, Nephro Code Status: full  [name]:  1/20 admitted to [facility], cards, CCM, nephrology consulted, central line placed  Assessment and Plan: Brandi Arellano is a 62 y.o. female presenting with diffuse abdominal pain, nausea, and vomiting.  She also has hypertensive urgency in the ED.  PMH is significant for NSTEMI in 12/18, bladder cancer, T2DM, ESRD, borderline HFpEF w/ EF 40-45%, hypothyroidism, right AKA, and previous stroke.    NSTEMI  Improving on increased oxycodone. Diffusely tender to light palpation on admission. Apparently similar presentation to last admission back in December in which she had an NSTEMI. Had PTCA and Drug-eluting stent at that admission. On aspirin and plavix. Troponin 1 0.43 at admission, trend 4.52->6.31->5.63. Appears that patient had an NSTEMI overnight which likely lowered her blood pressure acutely. Per notes from previous admission patient is a candidate to undergo rotoblater procedure at this time. Will follow up cardiology recs regarding this. For now patient needs to remain on heparin gtt but this is challenging due to patient's known bleeding issues from her perm cath and also her bladder mass. Will continue medical management at thist time. - vitals per unit routine - continuous pulse oximetry - gentle maintenance NS @ 75 ml/hr  - NPO - oxycodone IR 5 mg Q4H PRN - reglan 5 mg PO or 5 mg IV BID PRN for nausea - follow up cardiology recommendations - follow urine culture - continue zosyn pharmacy to dose (1/20 - ) - recheck troponin 1200 - Continue heparin gtt  Hypertension  Hypotension BP elevated to 203/101 at admission, likely due to pain and vomiting. Became  very hypotensive in am 1/21 down to 70s/30s. Received small bolus of NS overnight which improved pressure. 140/80s while in room. Continue to monitor. Could consider restarting home amlodipine if remains hypertensive during 1/21 but rather her be slightly hypertensive than hypotensive given that she is likely struggling to perfuse multiple organs at baseline. - hold amlodipine 10 mg - PRN hydralazine 10mg  IV for sbp > 180  Recent NSTEMI, with CAD s/p remote CABG: PCI and placement of drug-eluting stent of LIMA performed on 04/07/17.  Patient on ASA, plavix, atorvastatin, and lopressor at home. 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 - consider palliative care consult; patient has been resistant to palliative care in the past  T2DM: Well-controlled, with Hemoglobin A1c 6.8 on 02/07/17.  Diet controlled. - sensitive SSI Q4H with Q4H CBGs - will change to [facility] insulin when diet is advanced - follow up Hgb A1c  ESRD: Patient volume overloaded on exam and on CXR. Underwent emergent dialysis on 1/20. Has improved significantly since admission. - appreciate nephrology recs - daily renal function panel - daily weights - continue home thiamine and lanthanum  Borderline HFpEF: Last EF 40-45% on 04/05/17.  Volume overloaded on exam and on CXR. - continue Lopressor - daily weights - dialysis to correct volume overload  Hypothyroidism: Last TSH was 5.737 on 12/25.  Takes synthroid 25 mcg daily. - continue synthroid - repeat TSH  Previous stroke: Old basal ganglia and right thalamus lacunar infarcts.  Takes ASA,  plavix, atorvastatin - continue home medications  FEN/GI: NPO PPx: heprain, asa, plavix  Disposition: home vs snf  Subjective:  Doing much better this morning. Resting comfortably in med.  Pain improved. NPO.  Objective: Temp:  [97.6 F (36.4 C)-98.4 F (36.9 C)] 98.1 F (36.7 C) (01/21 0301) Pulse Rate:  [63-130] 65 (01/21 0830) Resp:  [12-28] 18 (01/21 0830) BP: (73-193)/(47-104) 149/67 (01/21 0830) SpO2:  [87 %-100 %] 97 % (01/21 0830) FiO2 (%):  [98 %] 98 % (01/21 0136) Weight:  [123 lb 0.3 oz (55.8 kg)] 123 lb 0.3 oz (55.8 kg) (01/21 0610) Physical Exam: General: no acute distress, resting comfortably,  AOx3. ON 2L [place] Cardiovascular: rrr, palpable radial pulse, Left IJ central line in place, subclavian dialysis catheter in place, LUE fistula noted,  Respiratory: crackles in lung bases bilaterally Abdomen: soft, non-tender, non-distended. Extremities: right AKA, able to move all extremities.  Laboratory: Recent Labs  Lab 05/01/17 0640 05/02/17 0008  WBC 9.0 6.8  HGB 11.8* 10.4*  HCT 36.9 34.9*  PLT 173 155   Recent Labs  Lab 05/01/17 0640 05/02/17 0153  NA 138 141  K 3.7 4.1  CL 97* 102  CO2 23 27  BUN 38* 29*  CREATININE 5.86* 5.07*  CALCIUM 9.5 8.4*  PROT 7.9  --   BILITOT 0.6  --   ALKPHOS 113  --   ALT 11*  --   AST 20  --   GLUCOSE 267* 83    Imaging/Diagnostic Tests: CLINICAL DATA:  Central line placement  EXAM: PORTABLE CHEST 1 VIEW  COMPARISON:  05/01/2017  FINDINGS: Right central venous catheter with tip in the right atrium, similar to position of previous study. Left central venous catheter with tip over the mid SVC region, slightly advanced since previous study. No pneumothorax. Postoperative changes in the mediastinum. Shallow inspiration. Heart size and pulmonary vascularity are normal. Increasing infiltration in the right mid and lower lung with decreased lung volume on the right consistent with progressing pneumonia. Small right pleural effusion is developing. Prominent vascular calcifications including aorta, bilateral subclavian and axillary arteries, and bilateral common carotid artery. Surgical clips in the  neck. Vascular stent in the left upper mediastinum.  IMPRESSION: Appliances appear in satisfactory position. Progression of consolidation and development of small pleural effusion in the right mid and lower lung likely representing progressing pneumonia. Prominent aortic atherosclerosis.  CLINICAL DATA:  Pain with nausea and vomiting.  EXAM: CT ABDOMEN AND PELVIS WITHOUT CONTRAST  TECHNIQUE: Multidetector CT imaging of the abdomen and pelvis was performed following the standard protocol without IV contrast.  COMPARISON:  February 06, 2017  FINDINGS: Lower chest: Bilateral pleural effusions with associated atelectasis identified. Small hiatal hernia.  Hepatobiliary: Previous cholecystectomy. The liver is otherwise unremarkable.  Pancreas: Unremarkable. No pancreatic ductal dilatation or surrounding inflammatory changes.  Spleen: Normal in size without focal abnormality.  Adrenals/Urinary Tract: Adrenal glands are normal. Significant vascular calcifications in both mildly atrophic kidneys. No hydronephrosis or ureterectasis. No ureteral stone. There is a Foley catheter in the bladder. The previously identified bladder mass deviates the catheter to the left. The bladder is thick walled as well which is stable to mildly improved.  Stomach/Bowel: The stomach and small bowel are normal. The colon is unremarkable. The appendix is not visualized but there is no secondary evidence of appendicitis.  Vascular/Lymphatic: Dense atherosclerotic changes in the nonaneurysmal aorta, branching vessels, iliac vessels, and femoral vessels. No adenopathy identified.  Reproductive: Uterus and bilateral adnexa are unremarkable.  Other:  No free air or free fluid.  Musculoskeletal: No acute or significant osseous findings.  IMPRESSION: 1. The known bladder mass deviates the Foley catheter to the left. 2. Bilateral pleural effusions with atelectasis. 3. Dense  atherosclerosis in the aorta and branching vessels. 4. No other significant abnormalities.  Guadalupe Dawn, MD 05/02/2017, 9:31 AM PGY-1, [place] Intern pager: [phone], text pages welcome

## 2017-05-02 NOTE — Progress Notes (Signed)
[place] for Heparin Indication: chest pain/ACS  Allergies  Allergen Reactions  . Diphenhydramine Nausea And Vomiting  . Tramadol Hcl Nausea And Vomiting  . Morphine And Related Nausea And Vomiting    Patient Measurements: Height: 5\' 5"  (165.1 cm) Weight: 123 lb 0.3 oz (55.8 kg) IBW/kg (Calculated) : 57  Vital Signs: Temp: 98.4 F (36.9 C) (01/21 1300) Temp Source: Oral (01/21 1300) BP: 149/67 (01/21 0830) Pulse Rate: 65 (01/21 0830)  Labs: Recent Labs    05/01/17 0640 05/01/17 1745  05/02/17 0008 05/02/17 0153 05/02/17 0657 05/02/17 1142 05/02/17 1411  HGB 11.8*  --   --  10.4*  --   --   --   --   HCT 36.9  --   --  34.9*  --   --   --   --   PLT 173  --   --  155  --   --   --   --   LABPROT 12.9  --   --   --   --   --   --   --   INR 0.98  --   --   --   --   --   --   --   HEPARINUNFRC  --   --   --   --   --   --   --  <0.10*  CREATININE 5.86*  --   --   --  5.07*  --   --   --   CKTOTAL  --  85  --   --   --   --   --   --   CKMB  --  7.8*  --   --   --   --   --   --   TROPONINI 0.43*  --    < >  --  6.31* 5.63* 4.36*  --    < > = values in this interval not displayed.    Estimated Creatinine Clearance: 10.4 mL/min (A) (by C-G formula based on SCr of 5.07 mg/dL (H)).   Medical History: Past Medical History:  Diagnosis Date  . Anemia   . Bladder mass 02/09/2017  . Bladder tumor 03/01/2017  . Buerger's disease ([place]) 02/08/2014  . CAD (coronary artery disease)    CABG  . CHF (congestive heart failure) (HCC)    Acute on chronic diastolic  . Complication of anesthesia   . Conjunctivitis 08/04/2015  . Diabetes mellitus without complication ([place])    Type II  . DM type 2, uncontrolled, with renal complications ([place]) 1/61/0960  . Dupuytren's contracture of left hand 05/07/2016  . DVT (deep venous thrombosis) ([place])   . ESRD (end stage renal disease) ([place])    HD M-W-F  . ESRD needing dialysis ([place]) 12/14/2013  .  Gangrene associated with diabetes mellitus ([place]) 03/20/2014  . Gangrene of lower extremity (HCC)    RLE  . GERD (gastroesophageal reflux disease)   . Hx of AKA (above knee amputation), right ([place])   . Hyperlipidemia   . Hypertension   . Hypothyroidism   . Insomnia 07/09/2015  . 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 PONV (postoperative nausea and vomiting)   . S/P dialysis catheter insertion ([place])   . Stable angina pectoris ([place])   . Status post above knee amputation, right ([place]) 02/08/2014  . Stroke [facility])    "light stroke" no deficits  . Thrombocytopenia ([place])   .  Thyroid disease    Abnormal Thyroid function Test  . UTI (lower urinary tract infection)     Medications:  Medications Prior to Admission  Medication Sig Dispense Refill Last Dose  . amLODipine (NORVASC) 10 MG tablet Take 10 mg by mouth daily with breakfast.   04/30/2017 at Unknown time  . aspirin EC 81 MG tablet Take 81 mg by mouth daily with breakfast.    05/01/2017 at Unknown time  . atorvastatin (LIPITOR) 40 MG tablet Take 40 mg by mouth at bedtime.    04/30/2017 at Unknown time  . clopidogrel (PLAVIX) 75 MG tablet Take 1 tablet (75 mg total) by mouth daily with breakfast. 30 tablet 0 04/30/2017 at Unknown time  . lanthanum (FOSRENOL) 1000 MG chewable tablet Chew 1,000 mg by mouth See admin instructions. Crush and sprinkle over food one tablet (1000 mg) three times daily with meals   04/30/2017 at Unknown time  . levothyroxine (SYNTHROID, LEVOTHROID) 25 MCG tablet Take 25 mcg by mouth daily with breakfast.    04/30/2017 at Unknown time  . metoprolol tartrate (LOPRESSOR) 25 MG tablet Take 1 tablet (25 mg total) by mouth 2 (two) times daily. 60 tablet 0 04/30/2017 at 20:00  . senna-docusate (SENOKOT-S) 8.6-50 MG tablet Take 1 tablet by mouth at bedtime as needed for mild constipation. 30 tablet 0 04/30/2017 at Unknown time  . thiamine 100 MG tablet Take 1 tablet (100 mg total) by mouth daily.  (Patient taking differently: Take 100 mg by mouth daily with breakfast. ) 30 tablet 0 04/30/2017 at Unknown time  . citalopram (CELEXA) 20 MG tablet Take 1 tablet (20 mg total) by mouth daily. (Patient not taking: Reported on 05/01/2017) 30 tablet 0 Not Taking at Unknown time    Assessment: 62 y.o. female with elevated troponin started on heparin overnight.  Heparin level undetectable this afternoon, no bleeding issues noted.    Will adjust rate.   Goal of Therapy:  Heparin level 0.3-0.7 units/ml Monitor platelets by anticoagulation protocol: Yes   Plan:  Rebolus 2000 units x1 then increase heparin rate to 850 units/hr Recheck heparin level in am  Erin Hearing PharmD., BCPS Clinical Pharmacist Pager [phone] 05/02/2017 3:41 PM

## 2017-05-02 NOTE — Progress Notes (Signed)
Critical provider came and saw patient and ordered heparin drip. Removed Venti Mask and decreased O2 to 4L. Patient RIJ still has blood on dressing with a big clot. Nurse will use the peripheral IV for the heparin.

## 2017-05-02 NOTE — Progress Notes (Signed)
Provider called back to give additional 200cc of fluid and recheck BP. Bp recheck 78/52 MAP 62.

## 2017-05-02 NOTE — Progress Notes (Signed)
Blood is sugar is 59. Provider on call paged. Patient alert ans responsive.

## 2017-05-02 NOTE — Progress Notes (Addendum)
Dsg removed from [place] central cath.  2 biopatches blood soaked removed.  Grape sized clot, noted.  Upon further evaluation, this is also the consistency of a hard grape.  Sutures around this mass.  Staff RN advised to call physician.  Family medicine here to check.  They are to call CCM.  Saline soaked guaze covering site.

## 2017-05-03 ENCOUNTER — Other Ambulatory Visit: Payer: Self-pay | Admitting: Family Medicine

## 2017-05-03 DIAGNOSIS — R109 Unspecified abdominal pain: Secondary | ICD-10-CM

## 2017-05-03 LAB — RENAL FUNCTION PANEL
ALBUMIN: 2.5 g/dL — AB (ref 3.5–5.0)
Anion gap: 17 — ABNORMAL HIGH (ref 5–15)
BUN: 47 mg/dL — ABNORMAL HIGH (ref 6–20)
CALCIUM: 8.1 mg/dL — AB (ref 8.9–10.3)
CO2: 21 mmol/L — ABNORMAL LOW (ref 22–32)
CREATININE: 6.59 mg/dL — AB (ref 0.44–1.00)
Chloride: 100 mmol/L — ABNORMAL LOW (ref 101–111)
GFR, EST AFRICAN AMERICAN: 7 mL/min — AB (ref >60)
GFR, EST NON AFRICAN AMERICAN: 6 mL/min — AB (ref >60)
Glucose, Bld: 132 mg/dL — ABNORMAL HIGH (ref 65–99)
PHOSPHORUS: 5.4 mg/dL — AB (ref 2.5–4.6)
Potassium: 4.5 mmol/L (ref 3.5–5.1)
SODIUM: 138 mmol/L (ref 135–145)

## 2017-05-03 LAB — COMPREHENSIVE METABOLIC PANEL
ALT: 11 U/L — AB (ref 14–54)
AST: 24 U/L (ref 15–41)
Albumin: 2.5 g/dL — ABNORMAL LOW (ref 3.5–5.0)
Alkaline Phosphatase: 62 U/L (ref 38–126)
Anion gap: 17 — ABNORMAL HIGH (ref 5–15)
BUN: 47 mg/dL — ABNORMAL HIGH (ref 6–20)
CHLORIDE: 100 mmol/L — AB (ref 101–111)
CO2: 21 mmol/L — AB (ref 22–32)
CREATININE: 6.59 mg/dL — AB (ref 0.44–1.00)
Calcium: 8.1 mg/dL — ABNORMAL LOW (ref 8.9–10.3)
GFR calc non Af Amer: 6 mL/min — ABNORMAL LOW (ref >60)
GFR, EST AFRICAN AMERICAN: 7 mL/min — AB (ref >60)
Glucose, Bld: 132 mg/dL — ABNORMAL HIGH (ref 65–99)
Potassium: 4.5 mmol/L (ref 3.5–5.1)
SODIUM: 138 mmol/L (ref 135–145)
Total Bilirubin: 1 mg/dL (ref 0.3–1.2)
Total Protein: 6.1 g/dL — ABNORMAL LOW (ref 6.5–8.1)

## 2017-05-03 LAB — GLUCOSE, CAPILLARY
GLUCOSE-CAPILLARY: 125 mg/dL — AB (ref 65–99)
GLUCOSE-CAPILLARY: 129 mg/dL — AB (ref 65–99)
GLUCOSE-CAPILLARY: 103 mg/dL — AB (ref 65–99)
GLUCOSE-CAPILLARY: 173 mg/dL — AB (ref 65–99)
GLUCOSE-CAPILLARY: 162 mg/dL — AB (ref 65–99)
Glucose-Capillary: 151 mg/dL — ABNORMAL HIGH (ref 65–99)
Glucose-Capillary: 137 mg/dL — ABNORMAL HIGH (ref 65–99)

## 2017-05-03 LAB — CBC
HCT: 29.9 % — ABNORMAL LOW (ref 36.0–46.0)
Hemoglobin: 9.2 g/dL — ABNORMAL LOW (ref 12.0–15.0)
MCH: 30.2 pg (ref 26.0–34.0)
MCHC: 30.8 g/dL (ref 30.0–36.0)
MCV: 98 fL (ref 78.0–100.0)
PLATELETS: 144 10*3/uL — AB (ref 150–400)
RBC: 3.05 MIL/uL — AB (ref 3.87–5.11)
RDW: 17.7 % — ABNORMAL HIGH (ref 11.5–15.5)
WBC: 6.8 10*3/uL (ref 4.0–10.5)

## 2017-05-03 LAB — HEPARIN LEVEL (UNFRACTIONATED)
HEPARIN UNFRACTIONATED: 0.13 [IU]/mL — AB (ref 0.30–0.70)
Heparin Unfractionated: 0.34 IU/mL (ref 0.30–0.70)

## 2017-05-03 LAB — URINE CULTURE

## 2017-05-03 MED ORDER — HEPARIN BOLUS VIA INFUSION
2000.0000 [IU] | Freq: Once | INTRAVENOUS | Status: AC
  Administered 2017-05-03: 2000 [IU] via INTRAVENOUS
  Filled 2017-05-03: qty 2000

## 2017-05-03 MED ORDER — HEPARIN SODIUM (PORCINE) 5000 UNIT/ML IJ SOLN
5000.0000 [IU] | Freq: Three times a day (TID) | INTRAMUSCULAR | Status: DC
  Administered 2017-05-03 – 2017-05-06 (×8): 5000 [IU] via SUBCUTANEOUS
  Filled 2017-05-03 (×8): qty 1

## 2017-05-03 NOTE — Progress Notes (Addendum)
Patient BP is 80/44mmHg,checked (3 times),HR-62 NSR,O2 sat 94 % on room air, blood sugar is 162,patient is alert and oriented,complaints of generalized weakness.Dr.Wallace (on call) made aware without orders made.Will continue to monitor patient.

## 2017-05-03 NOTE — Progress Notes (Addendum)
[place] for Heparin Indication: chest pain/ACS  Allergies  Allergen Reactions  . Diphenhydramine Nausea And Vomiting  . Tramadol Hcl Nausea And Vomiting  . Morphine And Related Nausea And Vomiting    Patient Measurements: Height: 5\' 5"  (165.1 cm) Weight: 119 lb 11.4 oz (54.3 kg) IBW/kg (Calculated) : 57  Vital Signs: Temp: 98.1 F (36.7 C) (01/22 1154) Temp Source: Oral (01/22 1154) BP: 139/68 (01/22 1154) Pulse Rate: 68 (01/22 1154)  Labs: Recent Labs    05/01/17 0640 05/01/17 1745  05/02/17 0008 05/02/17 0153 05/02/17 0657 05/02/17 1142 05/02/17 1411 05/03/17 0058 05/03/17 0302 05/03/17 1229  HGB 11.8*  --   --  10.4*  --   --   --   --   --  9.2*  --   HCT 36.9  --   --  34.9*  --   --   --   --   --  29.9*  --   PLT 173  --   --  155  --   --   --   --   --  144*  --   LABPROT 12.9  --   --   --   --   --   --   --   --   --   --   INR 0.98  --   --   --   --   --   --   --   --   --   --   HEPARINUNFRC  --   --   --   --   --   --   --  <0.10* 0.13*  --  0.34  CREATININE 5.86*  --   --   --  5.07*  --   --   --   --  6.59*  6.59*  --   CKTOTAL  --  85  --   --   --   --   --   --   --   --   --   CKMB  --  7.8*  --   --   --   --   --   --   --   --   --   TROPONINI 0.43*  --    < >  --  6.31* 5.63* 4.36*  --   --   --   --    < > = values in this interval not displayed.    Estimated Creatinine Clearance: 7.8 mL/min (A) (by C-G formula based on SCr of 6.59 mg/dL (H)).   Medical History: Past Medical History:  Diagnosis Date  . Anemia   . Bladder mass 02/09/2017  . Bladder tumor 03/01/2017  . Buerger's disease ([place]) 02/08/2014  . CAD (coronary artery disease)    CABG  . CHF (congestive heart failure) (HCC)    Acute on chronic diastolic  . Complication of anesthesia   . Conjunctivitis 08/04/2015  . Diabetes mellitus without complication ([place])    Type II  . DM type 2, uncontrolled, with renal complications  ([place]) 8/67/6720  . Dupuytren's contracture of left hand 05/07/2016  . DVT (deep venous thrombosis) ([place])   . ESRD (end stage renal disease) ([place])    HD M-W-F  . ESRD needing dialysis ([place]) 12/14/2013  . Gangrene associated with diabetes mellitus ([place]) 03/20/2014  . Gangrene of lower extremity (HCC)    RLE  . GERD (gastroesophageal reflux disease)   .  Hx of AKA (above knee amputation), right ([place])   . Hyperlipidemia   . Hypertension   . Hypothyroidism   . Insomnia 07/09/2015  . 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 PONV (postoperative nausea and vomiting)   . S/P dialysis catheter insertion ([place])   . Stable angina pectoris ([place])   . Status post above knee amputation, right ([place]) 02/08/2014  . Stroke [facility])    "light stroke" no deficits  . Thrombocytopenia ([place])   . Thyroid disease    Abnormal Thyroid function Test  . UTI (lower urinary tract infection)     Assessment: 62 y/o F on heparin for CP/elevated troponin. Heparin level at goal this afternoon. No issues per RN. Hgb continues to trend down from 11.8>>9.2 this am.   Goal of Therapy:  Heparin level 0.3-0.7 units/ml Monitor platelets by anticoagulation protocol: Yes   Plan:  Continue heparin at 950 units/hr Daily heparin level   Erin Hearing PharmD., BCPS Clinical Pharmacist 05/03/2017 1:22 PM

## 2017-05-03 NOTE — Progress Notes (Signed)
No charge note:  Palliative consult received. Left message for patient's brother, Brandi Arellano, requesting return call to schedule [place] meeting.   [name], AGNP-C Palliative Medicine  Please call Palliative Medicine team phone with any questions [phone]. For individual providers please see AMION.

## 2017-05-03 NOTE — Progress Notes (Signed)
Family Medicine Teaching Service Daily Progress Note Intern Pager: [phone]  Patient name: Brandi Arellano Medical record number: [id number] Date of birth: 03/20/1956 Age: 62 y.o. Gender: female  Primary Care Provider: Kerin Perna, NP Consultants: CCM, Cards, Nephro Code Status: full  [name]:  1/20 admitted to [facility], cards, CCM, nephrology consulted, central line placed 1/22 HD  Assessment and Plan: Brandi Arellano is a 62 y.o. female presenting with diffuse abdominal pain, nausea, and vomiting.  She also has hypertensive urgency in the ED.  PMH is significant for NSTEMI in 12/18, bladder cancer, T2DM, ESRD, borderline HFpEF w/ EF 40-45%, hypothyroidism, right AKA, and previous stroke.    Elevated Troponins Continues to improve from a pain standpoint on heparin gtt and pain meds. Reassuring troponin trend 4.51->6.31->5.63->4.36. Not felt to be a candidate for repeat cath or rotoblator procedure by cardiology due to multiple comorbidities. On Aspirin and plavix. Pressures fine since the early am of 1/21. Per cardiology recs will continue medical management. Will need to get patient off heparin gtt as soon as possible as she has multiple areas of bleeding risk including bladder mass and left IJ. - vitals per unit routine - continuous pulse oximetry - KVO fluids - carb-modified, renal diet - oxycodone IR 5 mg Q4H PRN - reglan 5 mg PO or 5 mg IV BID PRN for nausea - follow up cardiology recommendations - Ucx: multiple species present - continue zosyn pharmacy to dose (1/20 - ), can likely dc - Continue heparin gtt  Hypertension  Hypotension BP elevated to 203/101 at admission, likely due to pain and vomiting. Became very hypotensive in am 1/21 down to 70s/30s. Normotensive last 24 hours. Can restart fluids if hypotensive. - hold amlodipine 10 mg - PRN hydralazine 10mg  IV for sbp > 180  Recent NSTEMI, with CAD s/p remote CABG: PCI and placement of  drug-eluting stent of LIMA performed on 04/07/17.  Patient on ASA, plavix, atorvastatin, and lopressor at home. - continue asa and plavix - f/u cardiology recommendations  Squamous cell bladder cancer: Has had surgery to remove tumor, scheduled for nephroureterectomy with cystectomy in March. - Have asked Palliative to have goals of care discussion with patient   T2DM: Well-controlled, with Hemoglobin A1c 6.8 on 02/07/17.  Diet controlled. Recheck A1c 6.2 on 1/20. - sensitive SSI Q4H with Q4H CBGs - will change to [facility] insulin when diet is advanced  ESRD: Patient volume overloaded on exam and on CXR. Underwent emergent dialysis on 1/20. Has improved significantly since admission. - appreciate nephrology recs - daily renal function panel - daily weights - continue home thiamine and lanthanum  Borderline HFpEF: Last EF 40-45% on 04/05/17.  Volume overloaded on exam and on CXR. - continue Lopressor - daily weights - dialysis to correct volume overload  Hypothyroidism:  1/21 TSH 9.707. T4 0.78. Unclear if patient actually taking synthroid. Continue to monitor.  Previous stroke: Old basal ganglia and right thalamus lacunar infarcts.  Takes ASA, plavix, atorvastatin - continue home medications  FEN/GI: NPO PPx: heprain, asa, plavix  Disposition: home vs snf  Subjective:  Sleepy but easily arousable. Resting comfortably, receiving dialysis. No acute events.  Objective: Temp:  [97.7 F (36.5 C)-98.7 F (37.1 C)] 97.7 F (36.5 C) (01/22 0628) Pulse Rate:  [54-66] 58 (01/22 0800) Resp:  [11-18] 14 (01/22 0800) BP: (103-155)/(54-78) 109/68 (01/22 0800) SpO2:  [96 %-99 %] 99 % (01/22 0628) Weight:  [125 lb 3.5 oz (56.8 kg)-126 lb 12.2 oz (57.5  kg)] 125 lb 3.5 oz (56.8 kg) (01/22 2440) Physical Exam: General: no acute distress, resting comfortably,  AOx3. Cardiovascular: regular rate rhythm, palpable radial pulse, Left IJ central line in place, dialyzing through R IJ  pemcath. Respiratory: crackles in lung bases bilaterally Abdomen: soft, non-tender, non-distended. Extremities: right AKA, able to move all extremities.   Laboratory: Recent Labs  Lab 05/01/17 0640 05/02/17 0008 05/03/17 0302  WBC 9.0 6.8 6.8  HGB 11.8* 10.4* 9.2*  HCT 36.9 34.9* 29.9*  PLT 173 155 144*   Recent Labs  Lab 05/01/17 0640 05/02/17 0153 05/03/17 0302  NA 138 141 138  138  K 3.7 4.1 4.5  4.5  CL 97* 102 100*  100*  CO2 23 27 21*  21*  BUN 38* 29* 47*  47*  CREATININE 5.86* 5.07* 6.59*  6.59*  CALCIUM 9.5 8.4* 8.1*  8.1*  PROT 7.9  --  6.1*  BILITOT 0.6  --  1.0  ALKPHOS 113  --  62  ALT 11*  --  11*  AST 20  --  24  GLUCOSE 267* 83 132*  132*    Imaging/Diagnostic Tests: CLINICAL DATA:  Central line placement  EXAM: PORTABLE CHEST 1 VIEW  COMPARISON:  05/01/2017  FINDINGS: Right central venous catheter with tip in the right atrium, similar to position of previous study. Left central venous catheter with tip over the mid SVC region, slightly advanced since previous study. No pneumothorax. Postoperative changes in the mediastinum. Shallow inspiration. Heart size and pulmonary vascularity are normal. Increasing infiltration in the right mid and lower lung with decreased lung volume on the right consistent with progressing pneumonia. Small right pleural effusion is developing. Prominent vascular calcifications including aorta, bilateral subclavian and axillary arteries, and bilateral common carotid artery. Surgical clips in the neck. Vascular stent in the left upper mediastinum.  IMPRESSION: Appliances appear in satisfactory position. Progression of consolidation and development of small pleural effusion in the right mid and lower lung likely representing progressing pneumonia. Prominent aortic atherosclerosis.  CLINICAL DATA:  Pain with nausea and vomiting.  EXAM: CT ABDOMEN AND PELVIS WITHOUT  CONTRAST  TECHNIQUE: Multidetector CT imaging of the abdomen and pelvis was performed following the standard protocol without IV contrast.  COMPARISON:  February 06, 2017  FINDINGS: Lower chest: Bilateral pleural effusions with associated atelectasis identified. Small hiatal hernia.  Hepatobiliary: Previous cholecystectomy. The liver is otherwise unremarkable.  Pancreas: Unremarkable. No pancreatic ductal dilatation or surrounding inflammatory changes.  Spleen: Normal in size without focal abnormality.  Adrenals/Urinary Tract: Adrenal glands are normal. Significant vascular calcifications in both mildly atrophic kidneys. No hydronephrosis or ureterectasis. No ureteral stone. There is a Foley catheter in the bladder. The previously identified bladder mass deviates the catheter to the left. The bladder is thick walled as well which is stable to mildly improved.  Stomach/Bowel: The stomach and small bowel are normal. The colon is unremarkable. The appendix is not visualized but there is no secondary evidence of appendicitis.  Vascular/Lymphatic: Dense atherosclerotic changes in the nonaneurysmal aorta, branching vessels, iliac vessels, and femoral vessels. No adenopathy identified.  Reproductive: Uterus and bilateral adnexa are unremarkable.  Other: No free air or free fluid.  Musculoskeletal: No acute or significant osseous findings.  IMPRESSION: 1. The known bladder mass deviates the Foley catheter to the left. 2. Bilateral pleural effusions with atelectasis. 3. Dense atherosclerosis in the aorta and branching vessels. 4. No other significant abnormalities.  Guadalupe Dawn, MD 05/03/2017, 9:19 AM PGY-1, [place]  [place] Intern pager: [phone], text pages welcome

## 2017-05-03 NOTE — Progress Notes (Signed)
Pt has two patent peripheral IV sites in RUE.  MD team notified pt still has double lumen PICC, not in use at this time.  The team will discuss and enter orders if PICC line removed.

## 2017-05-03 NOTE — Progress Notes (Signed)
[place] for Heparin Indication: chest pain/ACS  Allergies  Allergen Reactions  . Diphenhydramine Nausea And Vomiting  . Tramadol Hcl Nausea And Vomiting  . Morphine And Related Nausea And Vomiting    Patient Measurements: Height: 5\' 5"  (165.1 cm) Weight: 123 lb 0.3 oz (55.8 kg) IBW/kg (Calculated) : 57  Vital Signs: Temp: 98.7 F (37.1 C) (01/22 0019) Temp Source: Oral (01/22 0019) BP: 128/68 (01/22 0019) Pulse Rate: 66 (01/22 0019)  Labs: Recent Labs    05/01/17 0640 05/01/17 1745  05/02/17 0008 05/02/17 0153 05/02/17 0657 05/02/17 1142 05/02/17 1411 05/03/17 0058  HGB 11.8*  --   --  10.4*  --   --   --   --   --   HCT 36.9  --   --  34.9*  --   --   --   --   --   PLT 173  --   --  155  --   --   --   --   --   LABPROT 12.9  --   --   --   --   --   --   --   --   INR 0.98  --   --   --   --   --   --   --   --   HEPARINUNFRC  --   --   --   --   --   --   --  <0.10* 0.13*  CREATININE 5.86*  --   --   --  5.07*  --   --   --   --   CKTOTAL  --  85  --   --   --   --   --   --   --   CKMB  --  7.8*  --   --   --   --   --   --   --   TROPONINI 0.43*  --    < >  --  6.31* 5.63* 4.36*  --   --    < > = values in this interval not displayed.    Estimated Creatinine Clearance: 10.4 mL/min (A) (by C-G formula based on SCr of 5.07 mg/dL (H)).   Medical History: Past Medical History:  Diagnosis Date  . Anemia   . Bladder mass 02/09/2017  . Bladder tumor 03/01/2017  . Buerger's disease ([place]) 02/08/2014  . CAD (coronary artery disease)    CABG  . CHF (congestive heart failure) (HCC)    Acute on chronic diastolic  . Complication of anesthesia   . Conjunctivitis 08/04/2015  . Diabetes mellitus without complication ([place])    Type II  . DM type 2, uncontrolled, with renal complications ([place]) 9/79/8921  . Dupuytren's contracture of left hand 05/07/2016  . DVT (deep venous thrombosis) ([place])   . ESRD (end stage renal disease)  ([place])    HD M-W-F  . ESRD needing dialysis ([place]) 12/14/2013  . Gangrene associated with diabetes mellitus ([place]) 03/20/2014  . Gangrene of lower extremity (HCC)    RLE  . GERD (gastroesophageal reflux disease)   . Hx of AKA (above knee amputation), right ([place])   . Hyperlipidemia   . Hypertension   . Hypothyroidism   . Insomnia 07/09/2015  . 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 PONV (postoperative nausea and vomiting)   . S/P dialysis catheter insertion ([place])   .  Stable angina pectoris ([place])   . Status post above knee amputation, right ([place]) 02/08/2014  . Stroke [facility])    "light stroke" no deficits  . Thrombocytopenia ([place])   . Thyroid disease    Abnormal Thyroid function Test  . UTI (lower urinary tract infection)     Medications:  Medications Prior to Admission  Medication Sig Dispense Refill Last Dose  . amLODipine (NORVASC) 10 MG tablet Take 10 mg by mouth daily with breakfast.   04/30/2017 at Unknown time  . aspirin EC 81 MG tablet Take 81 mg by mouth daily with breakfast.    05/01/2017 at Unknown time  . atorvastatin (LIPITOR) 40 MG tablet Take 40 mg by mouth at bedtime.    04/30/2017 at Unknown time  . clopidogrel (PLAVIX) 75 MG tablet Take 1 tablet (75 mg total) by mouth daily with breakfast. 30 tablet 0 04/30/2017 at Unknown time  . lanthanum (FOSRENOL) 1000 MG chewable tablet Chew 1,000 mg by mouth See admin instructions. Crush and sprinkle over food one tablet (1000 mg) three times daily with meals   04/30/2017 at Unknown time  . levothyroxine (SYNTHROID, LEVOTHROID) 25 MCG tablet Take 25 mcg by mouth daily with breakfast.    04/30/2017 at Unknown time  . metoprolol tartrate (LOPRESSOR) 25 MG tablet Take 1 tablet (25 mg total) by mouth 2 (two) times daily. 60 tablet 0 04/30/2017 at 20:00  . senna-docusate (SENOKOT-S) 8.6-50 MG tablet Take 1 tablet by mouth at bedtime as needed for mild constipation. 30 tablet 0 04/30/2017 at Unknown time  . thiamine  100 MG tablet Take 1 tablet (100 mg total) by mouth daily. (Patient taking differently: Take 100 mg by mouth daily with breakfast. ) 30 tablet 0 04/30/2017 at Unknown time  . citalopram (CELEXA) 20 MG tablet Take 1 tablet (20 mg total) by mouth daily. (Patient not taking: Reported on 05/01/2017) 30 tablet 0 Not Taking at Unknown time    Assessment: 62 y/o F on heparin for CP/elevated troponin. Heparin level sub-therapeutic but trending up. No issues per RN.  Goal of Therapy:  Heparin level 0.3-0.7 units/ml Monitor platelets by anticoagulation protocol: Yes   Plan:  -Rebolus heparin 2000 units x1 then increase heparin rate to 950 units/hr -1000 HL  Narda Bonds, PharmD, BCPS Clinical Pharmacist Phone: [phone]

## 2017-05-03 NOTE — Progress Notes (Addendum)
The patient has been seen in conjunction with Reino Bellis, NP-C. All aspects of care have been considered and discussed. The patient has been personally interviewed, examined, and all clinical data has been reviewed.   Digital images from the recent procedure were reviewed.  Clinically improved this morning.  Not short of breath or having epigastric discomfort.  Recommend medical therapy for underlying cardiac issues.  Agree with aggressive dialysis.  Change heparin to DVT prophylaxis.  We plan no further testing.  Progress Note  Patient Name: Brandi Arellano Date of Encounter: 05/03/2017  Primary Cardiologist: Radford Pax  Subjective   No more abd pain like prior to admission, but does report pressure in the lower pelvis with urination.   Inpatient Medications    Scheduled Meds: . aspirin EC  81 mg Oral Q breakfast  . atorvastatin  40 mg Oral QHS  . clopidogrel  75 mg Oral Q breakfast  . insulin aspart  0-9 Units Subcutaneous Q4H  . lanthanum  1,000 mg Oral TID WC  . levothyroxine  25 mcg Oral QAC breakfast  . sodium chloride flush  10-40 mL Intracatheter Q12H  . thiamine  100 mg Oral Q breakfast  . THROMBI-PAD  1 each Topical Once   Continuous Infusions: . sodium chloride Stopped (05/01/17 2151)  . heparin 950 Units/hr (05/03/17 5188)  . piperacillin-tazobactam (ZOSYN)  IV 3.375 g (05/03/17 1050)   PRN Meds: metoCLOPramide **OR** metoCLOPramide (REGLAN) injection, oxyCODONE, polyethylene glycol, senna-docusate   Vital Signs    Vitals:   05/03/17 0900 05/03/17 0930 05/03/17 0943 05/03/17 1029  BP: (!) 141/68 (!) 191/92 (!) 156/83 107/63  Pulse: 75 76 71 69  Resp: 13 14 14 18   Temp:   97.8 F (36.6 C) 98.2 F (36.8 C)  TempSrc:   Oral Oral  SpO2: 99% 95% 99% 99%  Weight:   119 lb 11.4 oz (54.3 kg)   Height:        Intake/Output Summary (Last 24 hours) at 05/03/2017 1054 Last data filed at 05/03/2017 4166 Gross per 24 hour  Intake 645 ml  Output 2650 ml    Net -2005 ml   Filed Weights   05/03/17 0328 05/03/17 0628 05/03/17 0943  Weight: 126 lb 12.2 oz (57.5 kg) 125 lb 3.5 oz (56.8 kg) 119 lb 11.4 oz (54.3 kg)    Telemetry    SR - Personally Reviewed  ECG    SR with continued deep TWIs - Personally Reviewed  Physical Exam   General: Chronically ill, thin, frail, older AA female appearing in no acute distress. Head: Normocephalic, atraumatic.  Neck: Supple, mild JVD. Lungs:  Resp regular and unlabored, CTA. Heart: RRR, S1, S2, no S3, S4, harsh systolic murmur; no rub. HD cath right upper chest.  Abdomen: Soft, non-tender, non-distended with normoactive bowel sounds.  Extremities: No clubbing, cyanosis, edema. . Neuro: Alert and oriented X 3. Moves all extremities spontaneously. Psych: Normal affect.  Labs    Chemistry Recent Labs  Lab 05/01/17 0640 05/02/17 0153 05/03/17 0302  NA 138 141 138  138  K 3.7 4.1 4.5  4.5  CL 97* 102 100*  100*  CO2 23 27 21*  21*  GLUCOSE 267* 83 132*  132*  BUN 38* 29* 47*  47*  CREATININE 5.86* 5.07* 6.59*  6.59*  CALCIUM 9.5 8.4* 8.1*  8.1*  PROT 7.9  --  6.1*  ALBUMIN 3.5 2.7* 2.5*  2.5*  AST 20  --  24  ALT 11*  --  11*  ALKPHOS 113  --  62  BILITOT 0.6  --  1.0  GFRNONAA 7* 8* 6*  6*  GFRAA 8* 10* 7*  7*  ANIONGAP 18* 12 17*  17*     Hematology Recent Labs  Lab 05/01/17 0640 05/02/17 0008 05/03/17 0302  WBC 9.0 6.8 6.8  RBC 3.72* 3.53* 3.05*  HGB 11.8* 10.4* 9.2*  HCT 36.9 34.9* 29.9*  MCV 99.2 98.9 98.0  MCH 31.7 29.5 30.2  MCHC 32.0 29.8* 30.8  RDW 17.8* 18.4* 17.7*  PLT 173 155 144*    Cardiac Enzymes Recent Labs  Lab 05/01/17 2217 05/02/17 0153 05/02/17 0657 05/02/17 1142  TROPONINI 4.52* 6.31* 5.63* 4.36*   No results for input(s): TROPIPOC in the last 168 hours.   BNP Recent Labs  Lab 05/02/17 0246  BNP >4,500.0*     DDimer No results for input(s): DDIMER in the last 168 hours.    Radiology    Ct Abdomen Pelvis Wo  Contrast  Result Date: 05/01/2017 CLINICAL DATA:  Pain with nausea and vomiting. EXAM: CT ABDOMEN AND PELVIS WITHOUT CONTRAST TECHNIQUE: Multidetector CT imaging of the abdomen and pelvis was performed following the standard protocol without IV contrast. COMPARISON:  February 06, 2017 FINDINGS: Lower chest: Bilateral pleural effusions with associated atelectasis identified. Small hiatal hernia. Hepatobiliary: Previous cholecystectomy. The liver is otherwise unremarkable. Pancreas: Unremarkable. No pancreatic ductal dilatation or surrounding inflammatory changes. Spleen: Normal in size without focal abnormality. Adrenals/Urinary Tract: Adrenal glands are normal. Significant vascular calcifications in both mildly atrophic kidneys. No hydronephrosis or ureterectasis. No ureteral stone. There is a Foley catheter in the bladder. The previously identified bladder mass deviates the catheter to the left. The bladder is thick walled as well which is stable to mildly improved. Stomach/Bowel: The stomach and small bowel are normal. The colon is unremarkable. The appendix is not visualized but there is no secondary evidence of appendicitis. Vascular/Lymphatic: Dense atherosclerotic changes in the nonaneurysmal aorta, branching vessels, iliac vessels, and femoral vessels. No adenopathy identified. Reproductive: Uterus and bilateral adnexa are unremarkable. Other: No free air or free fluid. Musculoskeletal: No acute or significant osseous findings. IMPRESSION: 1. The known bladder mass deviates the Foley catheter to the left. 2. Bilateral pleural effusions with atelectasis. 3. Dense atherosclerosis in the aorta and branching vessels. 4. No other significant abnormalities. Electronically Signed   By: Dorise Bullion III M.D   On: 05/01/2017 21:09   Dg Chest Portable 1 View  Result Date: 05/02/2017 CLINICAL DATA:  Central line placement EXAM: PORTABLE CHEST 1 VIEW COMPARISON:  05/01/2017 FINDINGS: Right central venous catheter  with tip in the right atrium, similar to position of previous study. Left central venous catheter with tip over the mid SVC region, slightly advanced since previous study. No pneumothorax. Postoperative changes in the mediastinum. Shallow inspiration. Heart size and pulmonary vascularity are normal. Increasing infiltration in the right mid and lower lung with decreased lung volume on the right consistent with progressing pneumonia. Small right pleural effusion is developing. Prominent vascular calcifications including aorta, bilateral subclavian and axillary arteries, and bilateral common carotid artery. Surgical clips in the neck. Vascular stent in the left upper mediastinum. IMPRESSION: Appliances appear in satisfactory position. Progression of consolidation and development of small pleural effusion in the right mid and lower lung likely representing progressing pneumonia. Prominent aortic atherosclerosis. Electronically Signed   By: Lucienne Capers M.D.   On: 05/02/2017 00:43   Dg Chest Portable 1 View  Result Date: 05/01/2017 CLINICAL DATA:  central line placement EXAM: PORTABLE CHEST 1 VIEW COMPARISON:  05/01/2017 FINDINGS: RIGHT central venous line unchanged. New LEFT central venous line tip in the mid SVC. Stable cardiac silhouette. Increased RIGHT upper lobe airspace opacity. Lung bases clear. IMPRESSION: 1. Increasing RIGHT upper lobe airspace opacities suggesting pulmonary edema. 2. Introduction of LEFT central venous line without pneumothorax. Electronically Signed   By: Suzy Bouchard M.D.   On: 05/01/2017 15:51    Cardiac Studies   TTE: 04/05/17  Study Conclusions  - Left ventricle: The cavity size was normal. Wall thickness was   increased in a pattern of moderate LVH. Systolic function was   mildly to moderately reduced. The estimated ejection fraction was   in the range of 40% to 45%. Diffuse hypokinesis. Features are   consistent with a pseudonormal left ventricular filling  pattern,   with concomitant abnormal relaxation and increased filling   pressure (grade 2 diastolic dysfunction). - Aortic valve: Trileaflet; severely thickened, severely calcified   leaflets. Valve mobility was restricted. There was mild to   moderate stenosis. There was trivial regurgitation. Peak velocity   (S): 274 cm/s. Mean gradient (S): 17 mm Hg. - Mitral valve: Severely calcified annulus. Mildly thickened   leaflets . - Left atrium: The atrium was mildly dilated. Volume/bsa, S: 38.7   ml/m^2.  Impressions:  - EF is reduced when compared to prior (45-50%)  Cath: 04/07/17  Conclusion     Ost LAD to Prox LAD lesion is 80% stenosed.  Prox LAD lesion is 90% stenosed.  Mid LAD-1 lesion is 90% stenosed.  Mid LAD-2 lesion is 95% stenosed.  Ost 1st Diag lesion is 50% stenosed.  Prox Cx to Mid Cx lesion is 100% stenosed with 100% stenosed side branch in Ost 2nd Mrg.  Ost RCA to Prox RCA lesion is 100% stenosed.  Mid RCA to Dist RCA lesion is 100% stenosed with 100% stenosed side branch in Acute Mrg.  Origin lesion is 80% stenosed.  Post intervention, there is a 0% residual stenosis.  A stent was successfully placed.   Successful percutaneous coronary intervention to a calcified 80-85% ostial LIMA graft stenosis treated with a 2.010 mm and 2.510 mm Wilverine cutting balloon for predilatation, and DES stenting with a 3.012 mm Synergy stent postdilated to 3.26 mm with the stenosis being reduced to 0%  RECOMMENDATION: The patient will continue with DAPT.  Medical therapy for significant concomitant CAD.  If she continues to experience symptomatology, consider atherectomy of the proximal LAD prior to a large first diagonal takeoff.   Patient Profile     62 y.o. female with a hx of remote CABG,CAD with NSTEMI on 04/07/17 s/p PTCA and DES to ostial LIMA graft with recommendationSfor medical therapy for residual disease, borderline HFpEF w/ EF 40-45%,ESRD on HD,  PVDs/p right BKA, HTN, HLD, DMand recent diagnosis of bladder cancer, s/p surgical resectionof tumorwho presented with abd pain, and positive troponins.   Assessment & Plan    1. NSTEMI: peak 6.31, now trending down. Denies any recurrent chest pain or abd pain. At this time given her multiple chronic issues, plan at this time is for medical therapy. Currently on ASA, statin and plavix. Blood pressures are soft at times, and has had episodes of hypotension this admission. Will hold on adding BB at this time.  -- has been in IV heparin for over 24 hours, suspect can dc this today.   2. ESRD:Per nephrology, HD yesterday. -HD MWF as outpatient.   3. Abdominal pain: -  Per IM -CT abdomen negative for bleeding. These symptoms could be her anginal symptoms as she reports the same with her previous admission requiring stenting to LIMA -LAD. Improved today.   4. Systolic HF: -Echo from last admission, EF 40-45% -Daily weights>> per HD -HD to correct fluid volume overload, weight stable today. No signs on volume overload today.   5. Right pleural effusion: - weaned from [place] to RA.   - Per IM  6. Hypertension: improved today. Would continue with current therapy.   7. Anemia: Has required transfusions in the past. Trending down.  -- daily CBC  Signed, Reino Bellis, NP  05/03/2017, 10:54 AM  Pager # [phone]   For questions or updates, please contact [place] Please consult [email or web address] for contact info under Cardiology/STEMI.

## 2017-05-03 NOTE — Progress Notes (Signed)
[facility] Progress Note  Subjective: no c/o, on HD  Vitals:   05/03/17 0930 05/03/17 0943 05/03/17 1029 05/03/17 1154  BP: (!) 191/92 (!) 156/83 107/63 139/68  Pulse: 76 71 69 68  Resp: 14 14 18 11   Temp:  97.8 F (36.6 C) 98.2 F (36.8 C) 98.1 F (36.7 C)  TempSrc:  Oral Oral Oral  SpO2: 95% 99% 99% 94%  Weight:  54.3 kg (119 lb 11.4 oz)    Height:        Inpatient medications: . aspirin EC  81 mg Oral Q breakfast  . atorvastatin  40 mg Oral QHS  . clopidogrel  75 mg Oral Q breakfast  . insulin aspart  0-9 Units Subcutaneous Q4H  . lanthanum  1,000 mg Oral TID WC  . levothyroxine  25 mcg Oral QAC breakfast  . sodium chloride flush  10-40 mL Intracatheter Q12H  . thiamine  100 mg Oral Q breakfast  . THROMBI-PAD  1 each Topical Once   . sodium chloride Stopped (05/01/17 2151)  . heparin 950 Units/hr (05/03/17 2119)   metoCLOPramide **OR** metoCLOPramide (REGLAN) injection, polyethylene glycol, senna-docusate  Exam: Alert, nasal O2 No jvd Chest clear bilat RRR 3/6 sem Abd soft ntnd GU indwell foley Ext no edema NF, OX 3 TDC R chest  Dialysis: MWF [facility] 4h   54kg   2/2.25  Linear Na UF 4   TDC  Hep 4000 - Venofer 50mg  IV weekly - Hectoral 59mcg IV q HD - Mircera 263mcg q 2 weeks - home BP Rx norvasc 10, metoprolol 25 bid   Impression: 1  N/V better 2  ESRD ^ vol / SOB , resolved, down to dry wt 3  +trop, med Rx per cards 4  DM 5  Anemia no esa for now 6  Bladder Ca 7  CAD  Plan - HD MWF,    [name] MD [facility] pager [phone]   05/03/2017, 1:46 PM   Recent Labs  Lab 05/01/17 0640 05/02/17 0153 05/03/17 0302  NA 138 141 138  138  K 3.7 4.1 4.5  4.5  CL 97* 102 100*  100*  CO2 23 27 21*  21*  GLUCOSE 267* 83 132*  132*  BUN 38* 29* 47*  47*  CREATININE 5.86* 5.07* 6.59*  6.59*  CALCIUM 9.5 8.4* 8.1*  8.1*  PHOS  --  5.1* 5.4*   Recent Labs  Lab 05/01/17 0640 05/02/17 0153 05/03/17 0302   AST 20  --  24  ALT 11*  --  11*  ALKPHOS 113  --  62  BILITOT 0.6  --  1.0  PROT 7.9  --  6.1*  ALBUMIN 3.5 2.7* 2.5*  2.5*   Recent Labs  Lab 05/01/17 0640 05/02/17 0008 05/03/17 0302  WBC 9.0 6.8 6.8  NEUTROABS 7.6  --   --   HGB 11.8* 10.4* 9.2*  HCT 36.9 34.9* 29.9*  MCV 99.2 98.9 98.0  PLT 173 155 144*   Iron/TIBC/Ferritin/ %Sat    Component Value Date/Time   IRON 123 02/16/2017 0900   TIBC 200 (L) 02/16/2017 0900   FERRITIN 1,388 (H) 02/16/2017 0900   IRONPCTSAT 61 (H) 02/16/2017 0900

## 2017-05-04 DIAGNOSIS — Z515 Encounter for palliative care: Secondary | ICD-10-CM

## 2017-05-04 DIAGNOSIS — Z7189 Other specified counseling: Secondary | ICD-10-CM

## 2017-05-04 LAB — CBC
HCT: 29.6 % — ABNORMAL LOW (ref 36.0–46.0)
Hemoglobin: 9 g/dL — ABNORMAL LOW (ref 12.0–15.0)
MCH: 30.1 pg (ref 26.0–34.0)
MCHC: 30.4 g/dL (ref 30.0–36.0)
MCV: 99 fL (ref 78.0–100.0)
Platelets: 149 10*3/uL — ABNORMAL LOW (ref 150–400)
RBC: 2.99 MIL/uL — ABNORMAL LOW (ref 3.87–5.11)
RDW: 17.1 % — ABNORMAL HIGH (ref 11.5–15.5)
WBC: 5.5 10*3/uL (ref 4.0–10.5)

## 2017-05-04 LAB — RENAL FUNCTION PANEL
ALBUMIN: 2.6 g/dL — AB (ref 3.5–5.0)
ANION GAP: 12 (ref 5–15)
BUN: 24 mg/dL — AB (ref 6–20)
CALCIUM: 8.6 mg/dL — AB (ref 8.9–10.3)
CO2: 24 mmol/L (ref 22–32)
Chloride: 100 mmol/L — ABNORMAL LOW (ref 101–111)
Creatinine, Ser: 4.83 mg/dL — ABNORMAL HIGH (ref 0.44–1.00)
GFR calc Af Amer: 10 mL/min — ABNORMAL LOW (ref >60)
GFR, EST NON AFRICAN AMERICAN: 9 mL/min — AB (ref >60)
GLUCOSE: 132 mg/dL — AB (ref 65–99)
PHOSPHORUS: 4 mg/dL (ref 2.5–4.6)
Potassium: 4.2 mmol/L (ref 3.5–5.1)
SODIUM: 136 mmol/L (ref 135–145)

## 2017-05-04 LAB — GLUCOSE, CAPILLARY
GLUCOSE-CAPILLARY: 127 mg/dL — AB (ref 65–99)
GLUCOSE-CAPILLARY: 253 mg/dL — AB (ref 65–99)
GLUCOSE-CAPILLARY: 287 mg/dL — AB (ref 65–99)
Glucose-Capillary: 130 mg/dL — ABNORMAL HIGH (ref 65–99)

## 2017-05-04 MED ORDER — OXYCODONE HCL 5 MG PO TABS
5.0000 mg | ORAL_TABLET | ORAL | Status: DC | PRN
  Administered 2017-05-04 – 2017-05-05 (×4): 5 mg via ORAL
  Filled 2017-05-04 (×4): qty 1

## 2017-05-04 NOTE — Progress Notes (Signed)
Progress Note  Patient Name: Brandi Arellano Date of Encounter: 05/04/2017  Primary Cardiologist: Radford Pax  Subjective   No chest pain, and breathing has significantly improved. Very frustrated about her bladder Ca and what the next step is. Palliative at the bedside.   Inpatient Medications    Scheduled Meds: . aspirin EC  81 mg Oral Q breakfast  . atorvastatin  40 mg Oral QHS  . clopidogrel  75 mg Oral Q breakfast  . heparin injection (subcutaneous)  5,000 Units Subcutaneous Q8H  . insulin aspart  0-9 Units Subcutaneous Q4H  . lanthanum  1,000 mg Oral TID WC  . levothyroxine  25 mcg Oral QAC breakfast  . sodium chloride flush  10-40 mL Intracatheter Q12H  . thiamine  100 mg Oral Q breakfast  . THROMBI-PAD  1 each Topical Once   Continuous Infusions: . sodium chloride Stopped (05/01/17 2151)   PRN Meds: metoCLOPramide **OR** metoCLOPramide (REGLAN) injection, polyethylene glycol, senna-docusate   Vital Signs    Vitals:   05/03/17 2140 05/03/17 2333 05/04/17 0404 05/04/17 0818  BP: 121/62 (!) 146/63 (!) 120/54 (!) 173/68  Pulse: 63 62 66 72  Resp: 14 12 (!) 9 13  Temp:  98.7 F (37.1 C) (!) 97.3 F (36.3 C)   TempSrc:  Oral Oral   SpO2: 97% 97% 97% 97%  Weight:   116 lb 13.5 oz (53 kg)   Height:        Intake/Output Summary (Last 24 hours) at 05/04/2017 0949 Last data filed at 05/04/2017 0406 Gross per 24 hour  Intake -  Output 25 ml  Net -25 ml   Filed Weights   05/03/17 0628 05/03/17 0943 05/04/17 0404  Weight: 125 lb 3.5 oz (56.8 kg) 119 lb 11.4 oz (54.3 kg) 116 lb 13.5 oz (53 kg)    Telemetry    SR with PACs - Personally Reviewed  Physical Exam   General: Chronically ill older AA female appearing in no acute distress. Head: Normocephalic, atraumatic.  Neck: Supple, no JVD. Lungs:  Resp regular and unlabored, CTA. Heart: RRR, S1, S2, no S3, S4, harsh 4/6 systolic murmur; no rub. Abdomen: Soft, non-tender, non-distended with normoactive bowel  sounds.  Extremities: No clubbing, cyanosis,  Right AKA.  Neuro: Alert and oriented X 3. Moves all extremities spontaneously. Psych: Normal affect.  Labs    Chemistry Recent Labs  Lab 05/01/17 0640 05/02/17 0153 05/03/17 0302 05/04/17 0438  NA 138 141 138  138 136  K 3.7 4.1 4.5  4.5 4.2  CL 97* 102 100*  100* 100*  CO2 23 27 21*  21* 24  GLUCOSE 267* 83 132*  132* 132*  BUN 38* 29* 47*  47* 24*  CREATININE 5.86* 5.07* 6.59*  6.59* 4.83*  CALCIUM 9.5 8.4* 8.1*  8.1* 8.6*  PROT 7.9  --  6.1*  --   ALBUMIN 3.5 2.7* 2.5*  2.5* 2.6*  AST 20  --  24  --   ALT 11*  --  11*  --   ALKPHOS 113  --  62  --   BILITOT 0.6  --  1.0  --   GFRNONAA 7* 8* 6*  6* 9*  GFRAA 8* 10* 7*  7* 10*  ANIONGAP 18* 12 17*  17* 12     Hematology Recent Labs  Lab 05/02/17 0008 05/03/17 0302 05/04/17 0438  WBC 6.8 6.8 5.5  RBC 3.53* 3.05* 2.99*  HGB 10.4* 9.2* 9.0*  HCT 34.9* 29.9* 29.6*  MCV  98.9 98.0 99.0  MCH 29.5 30.2 30.1  MCHC 29.8* 30.8 30.4  RDW 18.4* 17.7* 17.1*  PLT 155 144* 149*    Cardiac Enzymes Recent Labs  Lab 05/01/17 2217 05/02/17 0153 05/02/17 0657 05/02/17 1142  TROPONINI 4.52* 6.31* 5.63* 4.36*   No results for input(s): TROPIPOC in the last 168 hours.   BNP Recent Labs  Lab 05/02/17 0246  BNP >4,500.0*     DDimer No results for input(s): DDIMER in the last 168 hours.    Radiology    No results found.  Cardiac Studies   TTE: 04/05/17  Study Conclusions  - Left ventricle: The cavity size was normal. Wall thickness was increased in a pattern of moderate LVH. Systolic function was mildly to moderately reduced. The estimated ejection fraction was in the range of 40% to 45%. Diffuse hypokinesis. Features are consistent with a pseudonormal left ventricular filling pattern, with concomitant abnormal relaxation and increased filling pressure (grade 2 diastolic dysfunction). - Aortic valve: Trileaflet; severely thickened,  severely calcified leaflets. Valve mobility was restricted. There was mild to moderate stenosis. There was trivial regurgitation. Peak velocity (S): 274 cm/s. Mean gradient (S): 17 mm Hg. - Mitral valve: Severely calcified annulus. Mildly thickened leaflets . - Left atrium: The atrium was mildly dilated. Volume/bsa, S: 38.7 ml/m^2.  Impressions:  - EF is reduced when compared to prior (45-50%)  Cath: 04/07/17  Conclusion     Ost LAD to Prox LAD lesion is 80% stenosed.  Prox LAD lesion is 90% stenosed.  Mid LAD-1 lesion is 90% stenosed.  Mid LAD-2 lesion is 95% stenosed.  Ost 1st Diag lesion is 50% stenosed.  Prox Cx to Mid Cx lesion is 100% stenosed with 100% stenosed side branch in Ost 2nd Mrg.  Ost RCA to Prox RCA lesion is 100% stenosed.  Mid RCA to Dist RCA lesion is 100% stenosed with 100% stenosed side branch in Acute Mrg.  Origin lesion is 80% stenosed.  Post intervention, there is a 0% residual stenosis.  A stent was successfully placed.  Successful percutaneous coronary intervention to a calcified 80-85% ostial LIMA graft stenosis treated with a 2.010 mm and 2.510 mm Wilverine cutting balloon for predilatation, and DES stenting with a 3.012 mm Synergy stent postdilated to 3.26 mm with the stenosis being reduced to 0%  RECOMMENDATION: The patient will continue with DAPT. Medical therapy for significant concomitant CAD. If she continues to experience symptomatology, consider atherectomy of the proximal LAD prior to a large first diagonal takeoff.    Patient Profile     62 y.o. female with a hx of remote CABG,CAD with NSTEMI on 04/07/17 s/p PTCA and DES to ostial LIMA graft with recommendationSfor medical therapy for residual disease, borderline HFpEF w/ EF 40-45%,ESRD on HD, PVDs/p right BKA, HTN, HLD, DMand recent diagnosis of bladder cancer, s/p surgical resectionof tumorwhopresented with abd pain, and positive troponins.    Assessment & Plan    1. NSTEMI: likely in the setting of HF, HTN, AS with episodes of hypotension. She has severe underlying native vessel disease with patent grafts per cath back in 12/18. She was on heparin gtt, but now transitioned Sq. She is on medical therapy, and unable to titrate as she continues to have episodes of hypotension. Would continue with current therapy  2. N/v, Abd pain with Hx of Bladder Ca: According to notes, she underwent TURBT and suggested to have a cystectomy and bilateral nephrectomy if felt to be a surgical candidate. At this time given  overall prognosis is poor. Palliative care has been consulted by primary to address her goals of care.   3. ERSD on HD: Nephrology following. Had HD yesterday. Volume status significantly improved since admission  4. Chronic systolic HF: EF 77-41% on last echo. Volume status is now stable with HD.   5.  Calcific aortic stenosis -needs chronic outpatient longitudinal follow-up.  Not currently severe enough to require consideration of TAVR.  Call if we can be of further assistance.  Signed, Reino Bellis, NP  05/04/2017, 9:49 AM  Pager # [phone]   For questions or updates, please contact [place] Please consult [email or web address] for contact info under Cardiology/STEMI.

## 2017-05-04 NOTE — Progress Notes (Signed)
[facility] Progress Note  Subjective: no c/o  Vitals:   05/03/17 2140 05/03/17 2333 05/04/17 0404 05/04/17 0818  BP: 121/62 (!) 146/63 (!) 120/54 (!) 173/68  Pulse: 63 62 66 72  Resp: 14 12 (!) 9 13  Temp:  98.7 F (37.1 C) (!) 97.3 F (36.3 C)   TempSrc:  Oral Oral   SpO2: 97% 97% 97% 97%  Weight:   53 kg (116 lb 13.5 oz)   Height:        Inpatient medications: . aspirin EC  81 mg Oral Q breakfast  . atorvastatin  40 mg Oral QHS  . clopidogrel  75 mg Oral Q breakfast  . heparin injection (subcutaneous)  5,000 Units Subcutaneous Q8H  . insulin aspart  0-9 Units Subcutaneous Q4H  . lanthanum  1,000 mg Oral TID WC  . levothyroxine  25 mcg Oral QAC breakfast  . sodium chloride flush  10-40 mL Intracatheter Q12H  . thiamine  100 mg Oral Q breakfast  . THROMBI-PAD  1 each Topical Once   . sodium chloride Stopped (05/01/17 2151)   metoCLOPramide **OR** metoCLOPramide (REGLAN) injection, oxyCODONE, polyethylene glycol, senna-docusate  Exam: Alert, nasal O2 No jvd Chest clear bilat RRR 3/6 sem Abd soft ntnd GU indwell foley Ext no edema NF, OX 3 TDC R chest  Dialysis: MWF [facility] 4h   54kg   2/2.25  Linear Na UF 4   TDC  Hep 4000 - Venofer 50mg  IV weekly - Hectoral 22mcg IV q HD - Mircera 252mcg q 2 weeks - home BP Rx norvasc 10, metoprolol 25 bid   Impression: 1  N/V better 2  ESRD ^ vol / SOB , resolved, down to dry wt 3  +trop, med Rx per cards 4  DM 5  Anemia no esa for now 6  Bladder Ca 7  CAD 8  dispo - stable from renal standpoint  Plan - HD today   [name] MD [facility] pager [phone]   05/04/2017, 2:33 PM   Recent Labs  Lab 05/02/17 0153 05/03/17 0302 05/04/17 0438  NA 141 138  138 136  K 4.1 4.5  4.5 4.2  CL 102 100*  100* 100*  CO2 27 21*  21* 24  GLUCOSE 83 132*  132* 132*  BUN 29* 47*  47* 24*  CREATININE 5.07* 6.59*  6.59* 4.83*  CALCIUM 8.4* 8.1*  8.1* 8.6*  PHOS 5.1* 5.4* 4.0    Recent Labs  Lab 05/01/17 0640 05/02/17 0153 05/03/17 0302 05/04/17 0438  AST 20  --  24  --   ALT 11*  --  11*  --   ALKPHOS 113  --  62  --   BILITOT 0.6  --  1.0  --   PROT 7.9  --  6.1*  --   ALBUMIN 3.5 2.7* 2.5*  2.5* 2.6*   Recent Labs  Lab 05/01/17 0640 05/02/17 0008 05/03/17 0302 05/04/17 0438  WBC 9.0 6.8 6.8 5.5  NEUTROABS 7.6  --   --   --   HGB 11.8* 10.4* 9.2* 9.0*  HCT 36.9 34.9* 29.9* 29.6*  MCV 99.2 98.9 98.0 99.0  PLT 173 155 144* 149*   Iron/TIBC/Ferritin/ %Sat    Component Value Date/Time   IRON 123 02/16/2017 0900   TIBC 200 (L) 02/16/2017 0900   FERRITIN 1,388 (H) 02/16/2017 0900   IRONPCTSAT 61 (H) 02/16/2017 0900

## 2017-05-04 NOTE — Progress Notes (Signed)
Family Medicine Teaching Service Daily Progress Note Intern Pager: [phone]  Patient name: Brandi Arellano Medical record number: [id number] Date of birth: [birth year] Age: 62 y.o. Gender: female  Primary Care Provider: Kerin Perna, NP Consultants: CCM, Cards, Nephro Code Status: full  [name]:  1/20 admitted to [facility], cards, CCM, nephrology consulted, central line placed 1/22 HD  Assessment and Plan: Brandi Arellano is a 62 y.o. female presenting with diffuse abdominal pain, nausea, and vomiting.  She also has hypertensive urgency in the ED.  PMH is significant for NSTEMI in 12/18, bladder cancer, T2DM, ESRD, borderline HFpEF w/ EF 40-45%, hypothyroidism, right AKA, and previous stroke.    Elevated Troponins  NSTEMI Patient doing much better from this standpoint. Off heparin gtt and have stopped narcotics. Not a candidate for intervention at this time. On aspirin and plavix. Continues to have intermittent hypotension after dialysis sessions. Can likely dc 1/23 vs 1/24. Cardiology signed off 1/22. Palliative care saw and evaluated 1/23. Patient is now DNR/DNI but wants full workup done for bladder mass. Bladder mass detailed below. - vitals per unit routine - KVO fluids - carb-modified, renal diet - oxycodone IR 5 mg Q4H PRN per palliative recs - reglan 5 mg PO or 5 mg IV BID PRN for nausea - heparin 5000U for dvt ppx  Hypertension  Hypotension BP elevated to 203/101 at admission, likely due to pain and vomiting. Became very hypotensive in am 1/21 down to 70s/30s. Had another episode of hypotension down to 63Z systolic 8/58. Can restart fluids if hypotensive. - hold amlodipine 10 mg - PRN hydralazine 10mg  IV for sbp > 180  Recent NSTEMI, with CAD s/p remote CABG: PCI and placement of drug-eluting stent of LIMA performed on 04/07/17.  Patient on ASA, plavix, atorvastatin, and lopressor at home. - continue asa and plavix - f/u cardiology  recommendations  Squamous cell bladder cancer S/P TURBT. Will need much larger surgery to definitively cure her cancer. Urology understandably hesitant to perform operation as patient is very poor surgical candidate. Per notes from last admission, this surgery is likely to occur in April or may of 2019. Will need to improve from medical standpoint prior.   T2DM: Well-controlled, with Hemoglobin A1c 6.8 on 02/07/17.  Diet controlled. Recheck A1c 6.2 on 1/20. - sensitive SSI Q4H with Q4H CBGs - will change to [facility] insulin when diet is advanced  ESRD: Patient volume overloaded on exam and on CXR. Underwent emergent dialysis on 1/20. Has improved significantly since admission. - appreciate nephrology recs - daily renal function panel - daily weights - continue home thiamine and lanthanum  Borderline HFpEF: Last EF 40-45% on 04/05/17.  Volume overloaded on exam and on CXR. - continue Lopressor - daily weights - dialysis to correct volume overload  Hypothyroidism:  1/21 TSH 9.707. T4 0.78. Unclear if patient actually taking synthroid. Continue to monitor.  Previous stroke: Old basal ganglia and right thalamus lacunar infarcts.  Takes ASA, plavix, atorvastatin - continue home medications  FEN/GI: renal, carb-modified PPx: heparin, asa, plavix  Disposition: home vs snf  Subjective:  Doing well this morning. Complaining about lack of workup for bladder mass. Also complaining about food. No acute issues. Had [place] on face, not in nostrils.  Objective: Temp:  [97.3 F (36.3 C)-98.8 F (37.1 C)] 97.3 F (36.3 C) (01/23 0404) Pulse Rate:  [62-72] 72 (01/23 0818) Resp:  [9-14] 13 (01/23 0818) BP: (81-173)/(42-68) 173/68 (01/23 0818) SpO2:  [96 %-97 %] 97 % (  01/23 0818) Weight:  [116 lb 13.5 oz (53 kg)] 116 lb 13.5 oz (53 kg) (01/23 0404) Physical Exam: General: no acute distress, resting comfortably,  AOx3. Cardiovascular: regular rate rhythm, palpable radial pulse, Left IJ central  line in place, dialyzing through R IJ pemcath. Respiratory: crackles in lung bases bilaterally Abdomen: soft, non-tender, non-distended. Extremities: right AKA, able to move all extremities.   Laboratory: Recent Labs  Lab 05/02/17 0008 05/03/17 0302 05/04/17 0438  WBC 6.8 6.8 5.5  HGB 10.4* 9.2* 9.0*  HCT 34.9* 29.9* 29.6*  PLT 155 144* 149*   Recent Labs  Lab 05/01/17 0640 05/02/17 0153 05/03/17 0302 05/04/17 0438  NA 138 141 138  138 136  K 3.7 4.1 4.5  4.5 4.2  CL 97* 102 100*  100* 100*  CO2 23 27 21*  21* 24  BUN 38* 29* 47*  47* 24*  CREATININE 5.86* 5.07* 6.59*  6.59* 4.83*  CALCIUM 9.5 8.4* 8.1*  8.1* 8.6*  PROT 7.9  --  6.1*  --   BILITOT 0.6  --  1.0  --   ALKPHOS 113  --  62  --   ALT 11*  --  11*  --   AST 20  --  24  --   GLUCOSE 267* 83 132*  132* 132*    Imaging/Diagnostic Tests: CLINICAL DATA:  Central line placement  EXAM: PORTABLE CHEST 1 VIEW  COMPARISON:  05/01/2017  FINDINGS: Right central venous catheter with tip in the right atrium, similar to position of previous study. Left central venous catheter with tip over the mid SVC region, slightly advanced since previous study. No pneumothorax. Postoperative changes in the mediastinum. Shallow inspiration. Heart size and pulmonary vascularity are normal. Increasing infiltration in the right mid and lower lung with decreased lung volume on the right consistent with progressing pneumonia. Small right pleural effusion is developing. Prominent vascular calcifications including aorta, bilateral subclavian and axillary arteries, and bilateral common carotid artery. Surgical clips in the neck. Vascular stent in the left upper mediastinum.  IMPRESSION: Appliances appear in satisfactory position. Progression of consolidation and development of small pleural effusion in the right mid and lower lung likely representing progressing pneumonia. Prominent aortic atherosclerosis.  CLINICAL  DATA:  Pain with nausea and vomiting.  EXAM: CT ABDOMEN AND PELVIS WITHOUT CONTRAST  TECHNIQUE: Multidetector CT imaging of the abdomen and pelvis was performed following the standard protocol without IV contrast.  COMPARISON:  February 06, 2017  FINDINGS: Lower chest: Bilateral pleural effusions with associated atelectasis identified. Small hiatal hernia.  Hepatobiliary: Previous cholecystectomy. The liver is otherwise unremarkable.  Pancreas: Unremarkable. No pancreatic ductal dilatation or surrounding inflammatory changes.  Spleen: Normal in size without focal abnormality.  Adrenals/Urinary Tract: Adrenal glands are normal. Significant vascular calcifications in both mildly atrophic kidneys. No hydronephrosis or ureterectasis. No ureteral stone. There is a Foley catheter in the bladder. The previously identified bladder mass deviates the catheter to the left. The bladder is thick walled as well which is stable to mildly improved.  Stomach/Bowel: The stomach and small bowel are normal. The colon is unremarkable. The appendix is not visualized but there is no secondary evidence of appendicitis.  Vascular/Lymphatic: Dense atherosclerotic changes in the nonaneurysmal aorta, branching vessels, iliac vessels, and femoral vessels. No adenopathy identified.  Reproductive: Uterus and bilateral adnexa are unremarkable.  Other: No free air or free fluid.  Musculoskeletal: No acute or significant osseous findings.  IMPRESSION: 1. The known bladder mass deviates the Foley catheter to  the left. 2. Bilateral pleural effusions with atelectasis. 3. Dense atherosclerosis in the aorta and branching vessels. 4. No other significant abnormalities.  Guadalupe Dawn, MD 05/04/2017, 2:11 PM PGY-1, [place] Intern pager: [phone], text pages welcome

## 2017-05-04 NOTE — Progress Notes (Signed)
Patient back from [facility] and oriented V/S checked,central telemetry notified.Will continue to monitor.

## 2017-05-04 NOTE — Consult Note (Signed)
Consultation Note Date: 05/04/2017   Patient Name: Brandi Arellano  DOB: 1955-09-01  MRN: [id number]  Age / Sex: 62 y.o., female  PCP: Kerin Perna, NP Referring Physician: McDiarmid, Blane Ohara, MD  Reason for Consultation: Establishing goals of care  HPI/Patient Profile: 62 y.o. female  with past medical history of ESRD on HD, bladder ca (s/p resection, was scheduled for additional surgery 1/24, but now cancelled), remote CABG, multiesssel CAD, NSTEMI s/p PTCA, PVD s/p R BKA on 05/01/2017 with nausea, vomiting, abdominal pain, and increasing troponins. Workup reveals NSTEMI. Palliative medicine consulted for [place].    Clinical Assessment and Goals of Care: Patient in bed, denies pain, N/V.   Attempted to discuss [place] with patient. She was very difficult to engage in [place] conversation. States she has no preferences about end of life. Denies depression, suicidal ideations.  When asked what she enjoyed in life, she states there is nothing she enjoys. She has children but says she doesn't interact with them.   Asked her what her hopes are. She expresses that she is hopeful to have surgery for her bladder cancer. She states if the doctors tell her she cannot have surgery then she "will just deal with it". She feels she is "being eaten up" by her cancer, and the longer the surgery is postponed, the more advanced her cancer will be. Tells me she has been losing a significant amount of weight.   Attempted to discuss that her cardiac state may preclude the possibility of her having surgery. Discussed what her hopes would be if she were unable to have surgery and cancer was anticipated to progress. She states that she would "just accept it", would want to die peacefully. She notes she would refuse to stop dialysis in order to receive Hospice support.   Discussed code status- she states if she died peacefully she would not  want to be resuscitated. Agreed that DNR aligns more closely with this goal than full code status.   Symptom management- tells me she doesn't want pain medication. She seems concerned that providers may think she is fabricating pain in order to obtain medication. Discussed with her that cancer can be very painful. She says she would rather have the pain then take pain medication.  Primary Decision Maker PATIENT    SUMMARY OF RECOMMENDATIONS -DNR  -Unable to arrange full family GOC d/t patient's brother not returning calls -Pt wishes to continue full scope care. She is adamant that she wants surgery for her cancer.  -Recommend urology consult to discuss when/if bladder surgery can be performed -Patient states she would not want chemo- per chart review- Dr. Alen Blew indicates there is no role for chemo in her case -Will order pain medication for patient to take if she desires- noted Oxy IR 5mg  was previously working for her- she is describing her pain as spasmodic- oxybutynin TID would likely be beneficial- but hesitate to use it due to anticholinergic effects and pt low BP -Recommend outpatient Palliative f/u at discharge   Code Status/Advance  Care Planning:  DNR  Palliative Prophylaxis:   Frequent Pain Assessment  Additional Recommendations (Limitations, Scope, Preferences):  Full Scope Treatment  Prognosis:    Unable to determine  Discharge Planning: Home with Palliative Services  Primary Diagnoses: Present on Admission: . Hypoxia . Abdominal pain . Altered mental status . Bladder carcinoma ([place]) . Gastroparesis due to DM ([place]) . Hypertension . Hypothyroidism . Nausea and vomiting . NSTEMI (non-ST elevated myocardial infarction) ([place]) . HLD (hyperlipidemia) . Urinary tract infection associated with indwelling urethral catheter ([place])   I have reviewed the medical record, interviewed the patient and family, and examined the patient. The following aspects are  pertinent.  Past Medical History:  Diagnosis Date  . Anemia   . Bladder mass 02/09/2017  . Bladder tumor 03/01/2017  . Buerger's disease ([place]) 02/08/2014  . CAD (coronary artery disease)    CABG  . CHF (congestive heart failure) (HCC)    Acute on chronic diastolic  . Complication of anesthesia   . Conjunctivitis 08/04/2015  . Diabetes mellitus without complication ([place])    Type II  . DM type 2, uncontrolled, with renal complications ([place]) 1/82/9937  . Dupuytren's contracture of left hand 05/07/2016  . DVT (deep venous thrombosis) ([place])   . ESRD (end stage renal disease) ([place])    HD M-W-F  . ESRD needing dialysis ([place]) 12/14/2013  . Gangrene associated with diabetes mellitus ([place]) 03/20/2014  . Gangrene of lower extremity (HCC)    RLE  . GERD (gastroesophageal reflux disease)   . Hx of AKA (above knee amputation), right ([place])   . Hyperlipidemia   . Hypertension   . Hypothyroidism   . Insomnia 07/09/2015  . 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 PONV (postoperative nausea and vomiting)   . S/P dialysis catheter insertion ([place])   . Stable angina pectoris ([place])   . Status post above knee amputation, right ([place]) 02/08/2014  . Stroke [facility])    "light stroke" no deficits  . Thrombocytopenia ([place])   . Thyroid disease    Abnormal Thyroid function Test  . UTI (lower urinary tract infection)    Social History   Socioeconomic History  . Marital status: Divorced    Spouse name: None  . Number of children: None  . Years of education: None  . Highest education level: None  Social Needs  . Financial resource strain: None  . Food insecurity - worry: None  . Food insecurity - inability: None  . Transportation needs - medical: None  . Transportation needs - non-medical: None  Occupational History  . None  Tobacco Use  . Smoking status: Former Smoker    Years: 30.00    Types: Cigarettes  . Smokeless tobacco: Never Used  . Tobacco comment: quit in  2002  Substance and Sexual Activity  . Alcohol use: No  . Drug use: No  . Sexual activity: None  Other Topics Concern  . None  Social History Narrative   Pt currently living with her brother.   Independent w/ transfers bed>wheelchair   Family History  Adopted: Yes  Problem Relation Age of Onset  . Hypertension Father   . Heart disease Father        Coronary Artery Disease   Scheduled Meds: . aspirin EC  81 mg Oral Q breakfast  . atorvastatin  40 mg Oral QHS  . clopidogrel  75 mg Oral Q breakfast  . heparin injection (subcutaneous)  5,000 Units Subcutaneous Q8H  .  insulin aspart  0-9 Units Subcutaneous Q4H  . lanthanum  1,000 mg Oral TID WC  . levothyroxine  25 mcg Oral QAC breakfast  . sodium chloride flush  10-40 mL Intracatheter Q12H  . thiamine  100 mg Oral Q breakfast  . THROMBI-PAD  1 each Topical Once   Continuous Infusions: . sodium chloride Stopped (05/01/17 2151)   PRN Meds:.metoCLOPramide **OR** metoCLOPramide (REGLAN) injection, polyethylene glycol, senna-docusate Medications Prior to Admission:  Prior to Admission medications   Medication Sig Start Date End Date Taking? Authorizing Provider  amLODipine (NORVASC) 10 MG tablet Take 10 mg by mouth daily with breakfast.   Yes [provider]  aspirin EC 81 MG tablet Take 81 mg by mouth daily with breakfast.    Yes [provider]  atorvastatin (LIPITOR) 40 MG tablet Take 40 mg by mouth at bedtime.    Yes [provider]  clopidogrel (PLAVIX) 75 MG tablet Take 1 tablet (75 mg total) by mouth daily with breakfast. 04/12/17  Yes [place] Bing, DO  lanthanum (FOSRENOL) 1000 MG chewable tablet Chew 1,000 mg by mouth See admin instructions. Crush and sprinkle over food one tablet (1000 mg) three times daily with meals   Yes [provider]  levothyroxine (SYNTHROID, LEVOTHROID) 25 MCG tablet Take 25 mcg by mouth daily with breakfast.    Yes [provider]  metoprolol  tartrate (LOPRESSOR) 25 MG tablet Take 1 tablet (25 mg total) by mouth 2 (two) times daily. 03/15/17  Yes [place] Derry, [place], DO  senna-docusate (SENOKOT-S) 8.6-50 MG tablet Take 1 tablet by mouth at bedtime as needed for mild constipation. 03/15/17  Yes Enid Derry, [place], DO  thiamine 100 MG tablet Take 1 tablet (100 mg total) by mouth daily. Patient taking differently: Take 100 mg by mouth daily with breakfast.  03/16/17  Yes Enid Derry, [place], DO  citalopram (CELEXA) 20 MG tablet Take 1 tablet (20 mg total) by mouth daily. Patient not taking: Reported on 05/01/2017 04/12/17   Katy Bing, DO   Allergies  Allergen Reactions  . Diphenhydramine Nausea And Vomiting  . Tramadol Hcl Nausea And Vomiting  . Morphine And Related Nausea And Vomiting   Review of Systems  Constitutional: Positive for fatigue.  Respiratory: Negative for chest tightness.   Cardiovascular: Negative for chest pain.  Genitourinary: Positive for difficulty urinating.  Psychiatric/Behavioral: Negative for dysphoric mood and self-injury.    Physical Exam  Constitutional:  Frail, ill appearing  Cardiovascular: Normal rate and regular rhythm.  Pulmonary/Chest: Effort normal.  Skin: There is pallor.  Psychiatric:  Flat affect  Nursing note and vitals reviewed.   Vital Signs: BP (!) 173/68   Pulse 72   Temp (!) 97.3 F (36.3 C) (Oral)   Resp 13   Ht 5\' 5"  (1.651 m)   Wt 53 kg (116 lb 13.5 oz)   SpO2 97%   BMI 19.44 kg/m  Pain Assessment: 0-10   Pain Score: 3    SpO2: SpO2: 97 % O2 Device:SpO2: 97 % O2 Flow Rate: .O2 Flow Rate (L/min): 2 L/min  IO: Intake/output summary:   Intake/Output Summary (Last 24 hours) at 05/04/2017 1007 Last data filed at 05/04/2017 0406 Gross per 24 hour  Intake -  Output 25 ml  Net -25 ml    LBM: Last BM Date: 04/30/17 Baseline Weight: Weight: 53.5 kg (118 lb) Most recent weight: Weight: 53 kg (116 lb 13.5 oz)     Palliative Assessment/Data: PPS: 60%     Thank you for  this consult. Palliative medicine will continue to follow and assist as needed.   Time In: 0900 Time Out: 1010 Time Total: 70 mins Greater than 50%  of this time was spent counseling and coordinating care related to the above assessment and plan.  Signed by: [name], AGNP-C Palliative Medicine    Please contact Palliative Medicine Team phone at [phone] for questions and concerns.  For individual provider: See Shea Evans

## 2017-05-05 ENCOUNTER — Inpatient Hospital Stay (HOSPITAL_COMMUNITY): Admission: RE | Admit: 2017-05-05 | Payer: Medicaid Other | Source: Ambulatory Visit | Admitting: Urology

## 2017-05-05 ENCOUNTER — Encounter (HOSPITAL_COMMUNITY): Admission: RE | Payer: Self-pay | Source: Ambulatory Visit

## 2017-05-05 LAB — RENAL FUNCTION PANEL
ANION GAP: 13 (ref 5–15)
Albumin: 2.6 g/dL — ABNORMAL LOW (ref 3.5–5.0)
BUN: 19 mg/dL (ref 6–20)
CO2: 25 mmol/L (ref 22–32)
CREATININE: 3.73 mg/dL — AB (ref 0.44–1.00)
Calcium: 8.6 mg/dL — ABNORMAL LOW (ref 8.9–10.3)
Chloride: 98 mmol/L — ABNORMAL LOW (ref 101–111)
GFR calc Af Amer: 14 mL/min — ABNORMAL LOW (ref >60)
GFR calc non Af Amer: 12 mL/min — ABNORMAL LOW (ref >60)
GLUCOSE: 146 mg/dL — AB (ref 65–99)
PHOSPHORUS: 4 mg/dL (ref 2.5–4.6)
Potassium: 3.5 mmol/L (ref 3.5–5.1)
SODIUM: 136 mmol/L (ref 135–145)

## 2017-05-05 LAB — GLUCOSE, CAPILLARY
GLUCOSE-CAPILLARY: 139 mg/dL — AB (ref 65–99)
Glucose-Capillary: 176 mg/dL — ABNORMAL HIGH (ref 65–99)
Glucose-Capillary: 178 mg/dL — ABNORMAL HIGH (ref 65–99)

## 2017-05-05 SURGERY — NEPHROURETERECTOMY, ROBOT-ASSISTED, LAPAROSCOPIC
Anesthesia: General

## 2017-05-05 MED ORDER — METOPROLOL SUCCINATE ER 50 MG PO TB24: 50.0000 mg | ORAL_TABLET | Freq: Every day | ORAL | 0 refills | Status: AC

## 2017-05-05 MED ORDER — METOPROLOL SUCCINATE ER 50 MG PO TB24
50.0000 mg | ORAL_TABLET | Freq: Every day | ORAL | Status: DC
  Administered 2017-05-05: 50 mg via ORAL
  Filled 2017-05-05: qty 1

## 2017-05-05 MED ORDER — OXYCODONE HCL 5 MG PO TABS: 5.0000 mg | ORAL_TABLET | ORAL | 0 refills | Status: AC | PRN

## 2017-05-05 MED ORDER — POLYETHYLENE GLYCOL 3350 17 G PO PACK: 17.0000 g | PACK | Freq: Every day | ORAL | 0 refills | Status: AC | PRN

## 2017-05-05 NOTE — Care Management Note (Signed)
Case Management Note Marvetta Gibbons RN, BSN Unit 4E-Case Manager [phone]  Patient Details  Name: Brandi Arellano MRN: [id number] Date of Birth: 1955/10/02  Subjective/Objective:  Pt admitted with abd. Pain N/V- HTN                   Action/Plan: PTA pt lived at home with brother- hx ESRD- HD- M/W/F- pt also has hx of non-compliance- order for [facility] for BP checks/med. Compliance- Per MD ("We would like home health RN to measure patient's blood pressure three times per week")- spoke with pt at bedside - pt address and phone # confirmed in epic- brother's cell # also confirmed- choice offered to pt for [facility] services- per choice pt would like to use [facility] for [facility] services- per conversation with pt she goes to [facility] center and has transportation- pt also reports that she had aide services at some time but can not remember what agency was providing the [facility] or for how many hours- she states "that didn't work out they didn't do what they were suppose to"- referral called to [facility] with Alvis Lemmings for [facility] services- referral accepted for [facility] needs.   Expected Discharge Date:  05/05/17               Expected Discharge Plan:  [place]  In-House Referral:  NA  Discharge planning Services  CM Consult  Post Acute Care Choice:  Home Health Choice offered to:  Patient  DME Arranged:    DME Agency:     HH Arranged:  RN [place] Agency:  [place]  Status of Service:  Completed, signed off  If discussed at [place] of Stay Meetings, dates discussed:    Discharge Disposition: home/home health   Additional Comments:  Dawayne Patricia, RN 05/05/2017, 12:18 PM

## 2017-05-05 NOTE — Discharge Instructions (Signed)
Ms. Chavis, you were admitted for nausea and vomiting that was due to heart ischemia (lack of oxygen to your heart).  We gave you medicines to improve your heart, and you received dialysis while you were here.  We stopped your amlodipine for your blood pressure and started Toprol XL 50 mg daily for your heart.  You will need to see your PCP in the next week, and she will need to see you again in about one month to check your thyroid hormone level, since it was too high during this admission.    A home health nurse should be coming by your home about three times per week to check your blood pressure.

## 2017-05-05 NOTE — Progress Notes (Signed)
Family Medicine Teaching Service Daily Progress Note Intern Pager: [phone]  Patient name: Brandi Arellano Medical record number: [id number] Date of birth: [birth year] Age: 62 y.o. Gender: female  Primary Care Provider: Kerin Perna, NP Consultants: CCM, Cards, Nephro Code Status: full  [name]:  1/20 admitted to [facility], cards, CCM, nephrology consulted, central line placed 1/22 HD  Assessment and Plan: Brandi Arellano is a 62 y.o. female presenting with diffuse abdominal pain, nausea, and vomiting.  She also has hypertensive urgency in the ED.  PMH is significant for NSTEMI in 12/18, bladder cancer, T2DM, ESRD, borderline HFpEF w/ EF 40-45%, hypothyroidism, right AKA, and previous stroke.    Elevated Troponins  NSTEMI Patient doing much better from this standpoint. Off heparin gtt and have stopped narcotics. Not a candidate for intervention at this time. On aspirin and plavix. Continues to have intermittent hypotension after dialysis sessions. Can likely dc 1/23 vs 1/24. Cardiology signed off 1/22. Palliative care saw and evaluated 1/23. Patient is now DNR/DNI but wants full workup done for bladder mass. Bladder mass detailed below. - vitals per unit routine - KVO fluids - carb-modified, renal diet - oxycodone IR 5 mg Q4H PRN per palliative recs - reglan 5 mg PO or 5 mg IV BID PRN for nausea - heparin 5000U for dvt ppx - will d/c central line today before discharge  Hypertension  Hypotension BP elevated to 203/101 at admission, likely due to pain and vomiting. Became very hypotensive in am 1/21 down to 70s/30s. Had another episode of hypotension down to 69C systolic 7/89.  BP 124/74 on 1/24. - discontinue amlodipine 10 mg - PRN hydralazine 10mg  IV for sbp > 180 - start Toprol XL 50 mg daily  Recent NSTEMI, with CAD s/p remote CABG: PCI and placement of drug-eluting stent of LIMA performed on 04/07/17.  Patient on ASA, plavix, atorvastatin, and  lopressor at home. - continue asa and plavix - cardiology recommendations - continue medical therapy, chronic outpatient follow up for calcific aortic stenosis, dual antiplatelet at home  Squamous cell bladder cancer S/P TURBT. Will need much larger surgery to definitively cure her cancer. Urology understandably hesitant to perform operation as patient is very poor surgical candidate. Per notes from last admission, this surgery is likely to occur in April or may of 2019. Will need to improve from medical standpoint prior.   T2DM: Well-controlled, with Hemoglobin A1c 6.8 on 02/07/17.  Diet controlled. Recheck A1c 6.2 on 1/20. - sensitive SSI Q4H with Q4H CBGs - will change to [facility] insulin when diet is advanced  ESRD: Patient volume overloaded on exam and on CXR. Underwent emergent dialysis on 1/20. Has improved significantly since admission. - appreciate nephrology recs - daily renal function panel - daily weights - continue home thiamine and lanthanum - no esa for now  Borderline HFpEF: Last EF 40-45% on 04/05/17.  Volume overloaded on exam and on CXR. - daily weights - dialysis to correct volume overload  Hypothyroidism:  1/21 TSH 9.707. T4 0.78. Unclear if patient actually taking synthroid. Continue to monitor. - will need TSH rechecked outpatient in 4-6 weeks  Previous stroke: Old basal ganglia and right thalamus lacunar infarcts.  Takes ASA, plavix, atorvastatin - continue home medications  FEN/GI: renal, carb-modified PPx: heparin, asa, plavix  Disposition: discharge home today  Subjective:  Very talkative this morning, complaining of chronic pressure in bladder.  In good spirits overall.  Objective: Temp:  [98.4 F (36.9 C)-99.6 F (37.6 C)] 98.4  F (36.9 C) (01/24 0429) Pulse Rate:  [68-89] 78 (01/24 0124) Resp:  [13-23] 15 (01/24 0124) BP: (111-173)/(58-85) 124/74 (01/24 0429) SpO2:  [96 %-100 %] 100 % (01/24 0124) Weight:  [111 lb 8.8 oz (50.6 kg)-115 lb 15.4  oz (52.6 kg)] 114 lb 13.8 oz (52.1 kg) (01/24 8546) Physical Exam: General: no acute distress, resting comfortably,  AOx3. Cardiovascular: regular rate rhythm, palpable radial pulse, Left IJ central line in place Respiratory: crackles in lung bases bilaterally Abdomen: soft, non-tender, non-distended. Extremities: right AKA, able to move all extremities.   Laboratory: Recent Labs  Lab 05/02/17 0008 05/03/17 0302 05/04/17 0438  WBC 6.8 6.8 5.5  HGB 10.4* 9.2* 9.0*  HCT 34.9* 29.9* 29.6*  PLT 155 144* 149*   Recent Labs  Lab 05/01/17 0640  05/03/17 0302 05/04/17 0438 05/05/17 0347  NA 138   < > 138  138 136 136  K 3.7   < > 4.5  4.5 4.2 3.5  CL 97*   < > 100*  100* 100* 98*  CO2 23   < > 21*  21* 24 25  BUN 38*   < > 47*  47* 24* 19  CREATININE 5.86*   < > 6.59*  6.59* 4.83* 3.73*  CALCIUM 9.5   < > 8.1*  8.1* 8.6* 8.6*  PROT 7.9  --  6.1*  --   --   BILITOT 0.6  --  1.0  --   --   ALKPHOS 113  --  62  --   --   ALT 11*  --  11*  --   --   AST 20  --  24  --   --   GLUCOSE 267*   < > 132*  132* 132* 146*   < > = values in this interval not displayed.    Imaging/Diagnostic Tests: CLINICAL DATA:  Central line placement  EXAM: PORTABLE CHEST 1 VIEW  COMPARISON:  05/01/2017  FINDINGS: Right central venous catheter with tip in the right atrium, similar to position of previous study. Left central venous catheter with tip over the mid SVC region, slightly advanced since previous study. No pneumothorax. Postoperative changes in the mediastinum. Shallow inspiration. Heart size and pulmonary vascularity are normal. Increasing infiltration in the right mid and lower lung with decreased lung volume on the right consistent with progressing pneumonia. Small right pleural effusion is developing. Prominent vascular calcifications including aorta, bilateral subclavian and axillary arteries, and bilateral common carotid artery. Surgical clips in the neck. Vascular  stent in the left upper mediastinum.  IMPRESSION: Appliances appear in satisfactory position. Progression of consolidation and development of small pleural effusion in the right mid and lower lung likely representing progressing pneumonia. Prominent aortic atherosclerosis.  CLINICAL DATA:  Pain with nausea and vomiting.  EXAM: CT ABDOMEN AND PELVIS WITHOUT CONTRAST  TECHNIQUE: Multidetector CT imaging of the abdomen and pelvis was performed following the standard protocol without IV contrast.  COMPARISON:  February 06, 2017  FINDINGS: Lower chest: Bilateral pleural effusions with associated atelectasis identified. Small hiatal hernia.  Hepatobiliary: Previous cholecystectomy. The liver is otherwise unremarkable.  Pancreas: Unremarkable. No pancreatic ductal dilatation or surrounding inflammatory changes.  Spleen: Normal in size without focal abnormality.  Adrenals/Urinary Tract: Adrenal glands are normal. Significant vascular calcifications in both mildly atrophic kidneys. No hydronephrosis or ureterectasis. No ureteral stone. There is a Foley catheter in the bladder. The previously identified bladder mass deviates the catheter to the left. The bladder is thick walled  as well which is stable to mildly improved.  Stomach/Bowel: The stomach and small bowel are normal. The colon is unremarkable. The appendix is not visualized but there is no secondary evidence of appendicitis.  Vascular/Lymphatic: Dense atherosclerotic changes in the nonaneurysmal aorta, branching vessels, iliac vessels, and femoral vessels. No adenopathy identified.  Reproductive: Uterus and bilateral adnexa are unremarkable.  Other: No free air or free fluid.  Musculoskeletal: No acute or significant osseous findings.  IMPRESSION: 1. The known bladder mass deviates the Foley catheter to the left. 2. Bilateral pleural effusions with atelectasis. 3. Dense atherosclerosis in the aorta  and branching vessels. 4. No other significant abnormalities.  Kathrene Alu, MD 05/05/2017, 7:10 AM PGY-1, [place] Intern pager: [phone], text pages welcome

## 2017-05-05 NOTE — Progress Notes (Signed)
Family Medicine Progress Note  Removed left IJ central line in place. Area sterilized with alcohol prior procedure. Both sutures were cut with small scissors obtained from a suture removal kit. Pressure held on opening of skin and central line pulled out in usual fashion. Pressure held for 5 minutes. 4x4 placed on area and tape applied. The catheter tip was inspected and found to be intact. No bleeding noted immediately on gauze.  Guadalupe Dawn MD PGY-1 Family Medicine Resident

## 2017-05-05 NOTE — Progress Notes (Signed)
Daily Progress Note   Patient Name: Brandi Arellano       Date: 05/05/2017 DOB: August 08, 1955  Age: 62 y.o. MRN#: [id number] Attending Physician: McDiarmid, Blane Ohara, MD Primary Care Physician: Kerin Perna, NP Admit Date: 05/01/2017  Reason for Consultation/Follow-up: Establishing goals of care  Subjective: Patient in bed. Mood much improved from yesterday, excited to talk about her recipes and cooking at home. Notes improvement in abdominal pain with oxycodone IR. She is ready to discharge home. Continues to desire full scope care. Remains hopeful for surgery for bladder despite discussion of cardiac status inhibiting that possibility. Declines outpatient Palliative followup.  ROS  Length of Stay: 4  Current Medications: Scheduled Meds:  . aspirin EC  81 mg Oral Q breakfast  . atorvastatin  40 mg Oral QHS  . clopidogrel  75 mg Oral Q breakfast  . heparin injection (subcutaneous)  5,000 Units Subcutaneous Q8H  . insulin aspart  0-9 Units Subcutaneous Q4H  . lanthanum  1,000 mg Oral TID WC  . levothyroxine  25 mcg Oral QAC breakfast  . metoprolol succinate  50 mg Oral Daily  . sodium chloride flush  10-40 mL Intracatheter Q12H  . thiamine  100 mg Oral Q breakfast  . THROMBI-PAD  1 each Topical Once    Continuous Infusions: . sodium chloride Stopped (05/01/17 2151)    PRN Meds: metoCLOPramide **OR** metoCLOPramide (REGLAN) injection, oxyCODONE, polyethylene glycol, senna-docusate  Physical Exam  Constitutional:  frail  Pulmonary/Chest: Effort normal and breath sounds normal.  Abdominal: There is tenderness.  Skin: Skin is warm and dry.  Nursing note and vitals reviewed.           Vital Signs: BP (!) 160/74 (BP Location: Left Arm)   Pulse 69   Temp 97.7 F (36.5 C)  (Oral)   Resp 17   Ht 5\' 5"  (1.651 m)   Wt 52.1 kg (114 lb 13.8 oz)   SpO2 99%   BMI 19.11 kg/m  SpO2: SpO2: 99 % O2 Device: O2 Device: Not Delivered O2 Flow Rate: O2 Flow Rate (L/min): 2 L/min  Intake/output summary:   Intake/Output Summary (Last 24 hours) at 05/05/2017 1714 Last data filed at 05/05/2017 1330 Gross per 24 hour  Intake 360 ml  Output 1600 ml  Net -1240 ml   LBM: Last BM Date:  05/04/17 Baseline Weight: Weight: 53.5 kg (118 lb) Most recent weight: Weight: 52.1 kg (114 lb 13.8 oz)       Palliative Assessment/Data: PPS:     Flowsheet Rows     Most Recent Value  Intake Tab  Referral Department  Hospitalist  Unit at Time of Referral  Med/Surg Unit  Palliative Care Primary Diagnosis  Cancer  Date Notified  05/02/17  Palliative Care Type  Return patient Palliative Care  Reason for referral  Clarify Goals of Care  Date of Admission  05/01/17  Date first seen by Palliative Care  05/04/17  # of days Palliative referral response time  2 Day(s)  # of days IP prior to Palliative referral  1  Clinical Assessment  Psychosocial & Spiritual Assessment  Palliative Care Outcomes      Patient Active Problem List   Diagnosis Date Noted  . Advance care planning   . Hypertensive emergency   . Acute pulmonary edema (HCC)   . NSTEMI (non-ST elevated myocardial infarction) ([place]) 04/05/2017  . Hypoxia 04/04/2017  . Palliative care by specialist   . Acute blood loss anemia   . Bladder carcinoma ([place]) 03/05/2017  . Type 2 diabetes mellitus with chronic kidney disease on chronic dialysis, with long-term current use of insulin ([place])   . Urinary tract infection associated with indwelling urethral catheter ([place])   . Nausea and vomiting   . Abdominal pain   . Abnormal stress test   . Goals of care, counseling/discussion   . ESRD on hemodialysis ([place])   . Altered mental status 02/07/2017  . Axillary mass, left 07/01/2016  . HLD (hyperlipidemia) 08/04/2015  .  Gastroparesis due to DM ([place]) 02/12/2014  . Hyperparathyroidism, secondary renal ([place]) 02/06/2014  . Hyperphosphatemia 02/06/2014  . Anemia due to chronic kidney disease, on chronic dialysis ([place]) 02/06/2014  . Hypertension 12/26/2013  . CKD (chronic kidney disease) 12/26/2013  . Hypothyroidism 12/26/2013  . CAD (coronary artery disease) 12/26/2013    Palliative Care Assessment & Plan   Patient Profile: 62 y.o. female  with past medical history of ESRD on HD, bladder ca (s/p resection, was scheduled for additional surgery 1/24, but now cancelled), remote CABG, multiesssel CAD, NSTEMI s/p PTCA, PVD s/p R BKA on 05/01/2017 with nausea, vomiting, abdominal pain, and increasing troponins. Workup reveals NSTEMI. Palliative medicine consulted for [place].     Assessment/Recommendations/Plan   Please continue patient's oxycodone IR 5mg  at discharge for pain control of advancing bladder cancer  D/C home with home health  Goals of Care and Additional Recommendations:  Limitations on Scope of Treatment: Full Scope Treatment  Code Status:  DNR  Prognosis:   < 6 months likely due to bladder cancer without current treatment  Discharge Planning:  Home with [place] was discussed with patient.  Thank you for allowing the Palliative Medicine Team to assist in the care of this patient.   Time In: 1445 Time Out: 1510 Total Time 25 mins Prolonged Time Billed no      Greater than 50%  of this time was spent counseling and coordinating care related to the above assessment and plan.  [name], AGNP-C Palliative Medicine   Please contact Palliative Medicine Team phone at [phone] for questions and concerns.

## 2017-05-05 NOTE — Progress Notes (Signed)
Pt made aware of d/c this afternoon. Requesting d/c plans, inconclusive answers, delaying calling ride until after she ate dinner. When pt finished dinner, phoned brother who has the only key to the house, and he is in [place] helping somebody move. Pt states sister works until [employer]. And other brothers are "up [place]". MD made aware. Will continue to monitor at this time.

## 2017-05-06 LAB — GLUCOSE, CAPILLARY: GLUCOSE-CAPILLARY: 174 mg/dL — AB (ref 65–99)

## 2017-05-06 LAB — RENAL FUNCTION PANEL
ALBUMIN: 2.6 g/dL — AB (ref 3.5–5.0)
ANION GAP: 14 (ref 5–15)
BUN: 38 mg/dL — AB (ref 6–20)
CHLORIDE: 99 mmol/L — AB (ref 101–111)
CO2: 22 mmol/L (ref 22–32)
Calcium: 8.5 mg/dL — ABNORMAL LOW (ref 8.9–10.3)
Creatinine, Ser: 5.81 mg/dL — ABNORMAL HIGH (ref 0.44–1.00)
GFR calc Af Amer: 8 mL/min — ABNORMAL LOW (ref >60)
GFR, EST NON AFRICAN AMERICAN: 7 mL/min — AB (ref >60)
GLUCOSE: 172 mg/dL — AB (ref 65–99)
POTASSIUM: 4.4 mmol/L (ref 3.5–5.1)
Phosphorus: 6.5 mg/dL — ABNORMAL HIGH (ref 2.5–4.6)
Sodium: 135 mmol/L (ref 135–145)

## 2017-05-06 LAB — HEPATITIS B SURFACE ANTIGEN: Hepatitis B Surface Ag: NEGATIVE

## 2017-05-06 MED ORDER — HEPARIN SODIUM (PORCINE) 1000 UNIT/ML DIALYSIS: 4000.0000 [IU] | Freq: Once | INTRAMUSCULAR | Status: DC

## 2017-05-06 MED ORDER — OXYCODONE HCL 5 MG PO TABS: 5.0000 mg | ORAL_TABLET | ORAL | 0 refills | Status: DC | PRN

## 2017-05-06 NOTE — Progress Notes (Signed)
Discharged to home with family office visits in place teaching done  

## 2017-05-06 NOTE — Discharge Summary (Signed)
[facility] Discharge Summary  Patient name: Brandi Arellano Medical record number: [id number] Date of birth: 01-13-1956 Age: 62 y.o. Gender: female Date of Admission: 05/01/2017  Date of Discharge: 05/06/2017 Admitting Physician: Blane Ohara McDiarmid, MD  Primary Care Provider: Kerin Perna, NP Consultants: cardiology, nephrology  Indication for Hospitalization: Chest Pain  Discharge Diagnoses/Problem List:  NSTEMI Hypertension Squamous cell bladder cancer Type II diabetes mellitus  Disposition: home  Discharge Condition: fair  Discharge Exam: General: no acute distress, resting comfortably,  AOx3. Cardiovascular: regular rate rhythm, palpable radial pulse, Left IJ central line in place Respiratory: crackles in lung bases bilaterally Abdomen: soft, non-tender, non-distended. Extremities: right AKA, able to move all extremities.   Brief Hospital Course:  62 year old female who presented on 1/20 with abdominal pain, nausea, vomiting. Found to have troponin elevated to 0.43 which eventually increased to 6.27 on 1/21. She was treated as an NSTEMI and seen by cardiology. She was started on a heparin gtt overnight on 1/21 for treatment and given pain control. She was evaluated by cardiology who felt that she was not a good candidate for any intervention given her chronic medical problems. After 48 hours on the heparin gtt her symptoms had resolved and she was back at her baseline. Cardiology signed off on 1/24 with no plans for any intervention at this time.  She was evaluated by palliative care and she was made a dnr/dni. She still wishes for aggressive treatment of her cancer. Per palliative recs she was discharged home with pain medication to ease her discomfort. She is scheduled to be seen by urology in April per their notes for possible large pelvic surgery to remove any residual cancer after she had TURBT in 02/2017.  Issues for Follow Up:  1. Follow  up with urology regarding bladder cancer 2. Follow up with patient regarding chest pain  Significant Procedures:   Significant Labs and Imaging:  Recent Labs  Lab 05/02/17 0008 05/03/17 0302 05/04/17 0438  WBC 6.8 6.8 5.5  HGB 10.4* 9.2* 9.0*  HCT 34.9* 29.9* 29.6*  PLT 155 144* 149*   Recent Labs  Lab 05/01/17 0640 05/02/17 0153 05/03/17 0302 05/04/17 0438 05/05/17 0347 05/06/17 0327  NA 138 141 138  138 136 136 135  K 3.7 4.1 4.5  4.5 4.2 3.5 4.4  CL 97* 102 100*  100* 100* 98* 99*  CO2 23 27 21*  21* 24 25 22   GLUCOSE 267* 83 132*  132* 132* 146* 172*  BUN 38* 29* 47*  47* 24* 19 38*  CREATININE 5.86* 5.07* 6.59*  6.59* 4.83* 3.73* 5.81*  CALCIUM 9.5 8.4* 8.1*  8.1* 8.6* 8.6* 8.5*  PHOS  --  5.1* 5.4* 4.0 4.0 6.5*  ALKPHOS 113  --  62  --   --   --   AST 20  --  24  --   --   --   ALT 11*  --  11*  --   --   --   ALBUMIN 3.5 2.7* 2.5*  2.5* 2.6* 2.6* 2.6*    Results/Tests Pending at Time of Discharge:  Discharge Medications:  Allergies as of 05/06/2017      Reactions   Diphenhydramine Nausea And Vomiting   Tramadol Hcl Nausea And Vomiting   Morphine And Related Nausea And Vomiting      Medication List    STOP taking these medications   amLODipine 10 MG tablet Commonly known as:  NORVASC   citalopram  20 MG tablet Commonly known as:  CELEXA   metoprolol tartrate 25 MG tablet Commonly known as:  LOPRESSOR     TAKE these medications   aspirin EC 81 MG tablet Take 81 mg by mouth daily with breakfast.   atorvastatin 40 MG tablet Commonly known as:  LIPITOR Take 40 mg by mouth at bedtime.   clopidogrel 75 MG tablet Commonly known as:  PLAVIX Take 1 tablet (75 mg total) by mouth daily with breakfast.   lanthanum 1000 MG chewable tablet Commonly known as:  FOSRENOL Chew 1,000 mg by mouth See admin instructions. Crush and sprinkle over food one tablet (1000 mg) three times daily with meals   levothyroxine 25 MCG tablet Commonly known as:   SYNTHROID, LEVOTHROID Take 25 mcg by mouth daily with breakfast.   metoprolol succinate 50 MG 24 hr tablet Commonly known as:  TOPROL-XL Take 1 tablet (50 mg total) by mouth daily. Take with or immediately following a meal.   oxyCODONE 5 MG immediate release tablet Commonly known as:  Oxy IR/ROXICODONE Take 1 tablet (5 mg total) by mouth every 4 (four) hours as needed for moderate pain.   oxyCODONE 5 MG immediate release tablet Commonly known as:  ROXICODONE Take 1 tablet (5 mg total) by mouth every 4 (four) hours as needed for severe pain.   polyethylene glycol packet Commonly known as:  MIRALAX / GLYCOLAX Take 17 g by mouth daily as needed for mild constipation.   senna-docusate 8.6-50 MG tablet Commonly known as:  Senokot-S Take 1 tablet by mouth at bedtime as needed for mild constipation.   thiamine 100 MG tablet Take 1 tablet (100 mg total) by mouth daily. What changed:  when to take this       Discharge Instructions: Please refer to Patient Instructions section of EMR for full details.  Patient was counseled important signs and symptoms that should prompt return to medical care, changes in medications, dietary instructions, activity restrictions, and follow up appointments.   Follow-Up Appointments: Follow-up Information    Kerin Perna, NP Follow up.   Specialty:  Internal Medicine Contact information: [place] [address] [phone]        [facility] Follow up.   Specialty:  [place] Why:  [facility] arranged- for BP checks and medication review- they will call you to set up home visits Contact information: [address] [place] [address] [phone]           Guadalupe Dawn, MD 05/06/2017, 5:21 PM PGY-1, [place]

## 2017-05-06 NOTE — Progress Notes (Signed)
Family Medicine Progress Note  Due to issues with discharge yesterday attempted to clear up any excuses for why she couldn't go today. Asked for patient to call her brother while I was in the room. She discussed that she was being discharged later today with him. He is in agreement and said he would be by to pick her up later this morning. Patient going up to dialysis as I left room. Can be discharged from dialysis. Full progress note or discharge summary to follow later.  Guadalupe Dawn MD PGY-1 Family Medicine Resident

## 2017-05-06 NOTE — Procedures (Signed)
   I was present at this dialysis session, have reviewed the session itself and made  appropriate changes [name] MD [place] pager [phone]   05/06/2017, 12:25 PM

## 2017-05-09 ENCOUNTER — Other Ambulatory Visit: Payer: Self-pay | Admitting: Family Medicine

## 2017-05-13 ENCOUNTER — Encounter (HOSPITAL_COMMUNITY): Payer: Self-pay

## 2017-05-13 ENCOUNTER — Emergency Department (HOSPITAL_COMMUNITY)
Admission: EM | Admit: 2017-05-13 | Discharge: 2017-05-13 | Disposition: A | Discharge status: Home / Self Care | Payer: Medicaid Other | Attending: Emergency Medicine | Admitting: Emergency Medicine

## 2017-05-13 DIAGNOSIS — Z79899 Other long term (current) drug therapy: Secondary | ICD-10-CM | POA: Diagnosis not present

## 2017-05-13 DIAGNOSIS — E1122 Type 2 diabetes mellitus with diabetic chronic kidney disease: Secondary | ICD-10-CM | POA: Insufficient documentation

## 2017-05-13 DIAGNOSIS — Z8551 Personal history of malignant neoplasm of bladder: Secondary | ICD-10-CM | POA: Insufficient documentation

## 2017-05-13 DIAGNOSIS — R1030 Lower abdominal pain, unspecified: Secondary | ICD-10-CM | POA: Diagnosis not present

## 2017-05-13 DIAGNOSIS — Z87891 Personal history of nicotine dependence: Secondary | ICD-10-CM | POA: Diagnosis not present

## 2017-05-13 DIAGNOSIS — I2510 Atherosclerotic heart disease of native coronary artery without angina pectoris: Secondary | ICD-10-CM | POA: Insufficient documentation

## 2017-05-13 DIAGNOSIS — N186 End stage renal disease: Secondary | ICD-10-CM | POA: Insufficient documentation

## 2017-05-13 DIAGNOSIS — R310 Gross hematuria: Secondary | ICD-10-CM | POA: Diagnosis present

## 2017-05-13 DIAGNOSIS — Z992 Dependence on renal dialysis: Secondary | ICD-10-CM | POA: Insufficient documentation

## 2017-05-13 DIAGNOSIS — I5032 Chronic diastolic (congestive) heart failure: Secondary | ICD-10-CM | POA: Insufficient documentation

## 2017-05-13 DIAGNOSIS — Z86718 Personal history of other venous thrombosis and embolism: Secondary | ICD-10-CM | POA: Diagnosis not present

## 2017-05-13 DIAGNOSIS — I132 Hypertensive heart and chronic kidney disease with heart failure and with stage 5 chronic kidney disease, or end stage renal disease: Secondary | ICD-10-CM | POA: Insufficient documentation

## 2017-05-13 DIAGNOSIS — Z89611 Acquired absence of right leg above knee: Secondary | ICD-10-CM | POA: Insufficient documentation

## 2017-05-13 LAB — URINALYSIS, ROUTINE W REFLEX MICROSCOPIC
Bilirubin Urine: NEGATIVE
Glucose, UA: 50 mg/dL — AB
Ketones, ur: 5 mg/dL — AB
NITRITE: NEGATIVE
PH: 7 (ref 5.0–8.0)
Protein, ur: 100 mg/dL — AB
SPECIFIC GRAVITY, URINE: 1.016 (ref 1.005–1.030)

## 2017-05-13 MED ORDER — OXYCODONE HCL 5 MG PO TABS
5.0000 mg | ORAL_TABLET | Freq: Once | ORAL | Status: AC
  Administered 2017-05-13: 5 mg via ORAL
  Filled 2017-05-13: qty 1

## 2017-05-13 MED ORDER — CEPHALEXIN 500 MG PO CAPS
1000.0000 mg | ORAL_CAPSULE | Freq: Once | ORAL | Status: AC
  Administered 2017-05-13: 1000 mg via ORAL
  Filled 2017-05-13: qty 2

## 2017-05-13 NOTE — ED Notes (Signed)
Bed: NI77 Expected date:  Expected time:  Means of arrival:  Comments: Bladder Cancer/blood in catheter

## 2017-05-13 NOTE — Discharge Instructions (Signed)
When you attend dialysis today please advise them that you were given antibiotics for possible urinary tract infection in the ED.  They may need to repeat your antibiotics after dialysis.  Advised them that a urine culture is pending.

## 2017-05-13 NOTE — ED Provider Notes (Signed)
[name] DEPT Provider Note: [name], MD, FACEP  CSN: [id number] MRN: [id number] ARRIVAL: 05/13/17 at [place]: [place]  Hematuria   HISTORY OF PRESENT ILLNESS  05/13/17 6:00 AM Brandi Arellano is a 62 y.o. female with multiple medical problems including bladder cancer who has an indwelling Foley for urinary retention.  The Foley was last changed a week ago.  She is here this morning after noticing gross blood in her Foley bag along with suprapubic pain consistent with incomplete voiding.  Her bladder pain is been as severe as a 10 out of 10 at its worse.  She has not had associated fever or chills.  She reports her urine has been foul-smelling.  Her Foley catheter was irrigated by nursing staff prior to my evaluation.  The nurse reports the urine is foul-smelling.  This relieved her urinary retention and several clots were noted in the irrigant.  She denies pain presently.   Past Medical History:  Diagnosis Date  . Anemia   . Bladder mass 02/09/2017  . Bladder tumor 03/01/2017  . Buerger's disease ([place]) 02/08/2014  . CAD (coronary artery disease)    CABG  . CHF (congestive heart failure) (HCC)    Acute on chronic diastolic  . Complication of anesthesia   . Conjunctivitis 08/04/2015  . Diabetes mellitus without complication ([place])    Type II  . DM type 2, uncontrolled, with renal complications ([place]) 1/85/6314  . Dupuytren's contracture of left hand 05/07/2016  . DVT (deep venous thrombosis) ([place])   . ESRD (end stage renal disease) ([place])    HD M-W-F  . ESRD needing dialysis ([place]) 12/14/2013  . Gangrene associated with diabetes mellitus ([place]) 03/20/2014  . Gangrene of lower extremity (HCC)    RLE  . GERD (gastroesophageal reflux disease)   . Hx of AKA (above knee amputation), right ([place])   . Hyperlipidemia   . Hypertension   . Hypothyroidism   . Insomnia 07/09/2015  . 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 PONV  (postoperative nausea and vomiting)   . S/P dialysis catheter insertion ([place])   . Stable angina pectoris ([place])   . Status post above knee amputation, right ([place]) 02/08/2014  . Stroke [facility])    "light stroke" no deficits  . Thrombocytopenia ([place])   . Thyroid disease    Abnormal Thyroid function Test  . UTI (lower urinary tract infection)     Past Surgical History:  Procedure Laterality Date  . ABDOMINAL ANGIOGRAM  12/26/2013   Procedure: ABDOMINAL ANGIOGRAM;  Surgeon: Serafina Simonet, MD;  Location: [facility] CATH LAB;  Service: Cardiovascular;;  . AMPUTATION Right 02/01/2014   Procedure: Right Below Knee Amputation;  Surgeon: Newt Minion, MD;  Location: [place];  Service: Orthopedics;  Laterality: Right;  . AMPUTATION Right 03/24/2014   Procedure: AMPUTATION ABOVE KNEE;  Surgeon: Newt Minion, MD;  Location: [place];  Service: Orthopedics;  Laterality: Right;  . ARCH AORTOGRAM  12/26/2013   Procedure: ARCH AORTOGRAM;  Surgeon: Serafina Wery, MD;  Location: [facility] CATH LAB;  Service: Cardiovascular;;  . AXILLARY ARTERY - BRACHIAL ARTERY BYPASS GRAFT  11/07/2013   At [place]     Bilateral  . CHOLECYSTECTOMY    . CORONARY ARTERY BYPASS GRAFT  2012   in [place], [place] N/A 04/07/2017   Procedure: CORONARY STENT INTERVENTION;  Surgeon: Troy Sine, MD;  Location:  [place] INVASIVE CV LAB;  Service: Cardiovascular;  Laterality: N/A;  . CYSTOSCOPY W/ RETROGRADES Left 03/01/2017   Procedure: CYSTOSCOPY WITH RETROGRADE PYELOGRAM;  Surgeon: Irine Seal, MD;  [facility];  Service: Urology;  Laterality: Left;  . FOOT AMPUTATION Bilateral   . HARDWARE REMOVAL Right 07/19/2014   Procedure: Removal Deep Hardware Right Femur;  Surgeon: Newt Minion, MD;  Location: [place];  Service: Orthopedics;  Laterality: Right;  Six screws and one condylar plate removed   . hemodialysis catheter    . INSERTION OF DIALYSIS CATHETER Right 02/07/2017    Procedure: INSERTION OF DIALYSIS CATHETER;  Surgeon: Rosetta Posner, MD;  Location: [place];  Service: Vascular;  Laterality: Right;  . LEFT HEART CATH AND CORONARY ANGIOGRAPHY N/A 04/06/2017   Procedure: LEFT HEART CATH AND CORONARY ANGIOGRAPHY;  Surgeon: Troy Sine, MD;  Location: [place] CV LAB;  Service: Cardiovascular;  Laterality: N/A;  . LEFT HEART CATH AND CORS/GRAFTS ANGIOGRAPHY N/A 02/11/2017   Procedure: LEFT HEART CATH AND CORS/GRAFTS ANGIOGRAPHY;  Surgeon: Nelva Bush, MD;  Location: [place] CV LAB;  Service: Cardiovascular;  Laterality: N/A;  . LOWER EXTREMITY ANGIOGRAM N/A 12/26/2013   Procedure: LOWER EXTREMITY ANGIOGRAM;  Surgeon: Serafina Venti, MD;  Location: [facility] CATH LAB;  Service: Cardiovascular;  Laterality: N/A;  . TRANSURETHRAL RESECTION OF BLADDER TUMOR N/A 03/01/2017   Procedure: TRANSURETHRAL RESECTION OF LARGE BLADDER TUMOR (TURBT)FROM BLADDER NECK;  Surgeon: Irine Seal, MD;  [facility];  Service: Urology;  Laterality: N/A;  . UPPER EXTREMITY ANGIOGRAM Right 12/26/2013   Procedure: UPPER EXTREMITY ANGIOGRAM;  Surgeon: Serafina Friar, MD;  Location: [facility] CATH LAB;  Service: Cardiovascular;  Laterality: Right;    Family History  Adopted: Yes  Problem Relation Age of Onset  . Hypertension Father   . Heart disease Father        Coronary Artery Disease    Social History   Tobacco Use  . Smoking status: Former Smoker    Years: 30.00    Types: Cigarettes  . Smokeless tobacco: Never Used  . Tobacco comment: quit in 2002  Substance Use Topics  . Alcohol use: No  . Drug use: No    Prior to Admission medications   Medication Sig Start Date End Date Taking? Authorizing Provider  aspirin EC 81 MG tablet Take 81 mg by mouth daily with breakfast.    Yes [provider]  atorvastatin (LIPITOR) 40 MG tablet Take 40 mg by mouth at bedtime.    Yes [provider]  clopidogrel (PLAVIX) 75 MG tablet Take 1 tablet (75 mg total) by mouth  daily with breakfast. 04/12/17  Yes Hecla Bing, DO  lanthanum (FOSRENOL) 1000 MG chewable tablet Chew 1,000 mg by mouth See admin instructions. Crush and sprinkle over food one tablet (1000 mg) three times daily with meals   Yes [provider]  levothyroxine (SYNTHROID, LEVOTHROID) 25 MCG tablet Take 25 mcg by mouth daily with breakfast.    Yes [provider]  metoprolol succinate (TOPROL-XL) 50 MG 24 hr tablet Take 1 tablet (50 mg total) by mouth daily. Take with or immediately following a meal. 05/05/17  Yes Winfrey, Alcario Drought, MD  oxyCODONE (OXY IR/ROXICODONE) 5 MG immediate release tablet Take 1 tablet (5 mg total) by mouth every 4 (four) hours as needed for moderate pain. 05/05/17  Yes Winfrey, Alcario Drought, MD  senna-docusate (SENOKOT-S) 8.6-50 MG tablet Take 1 tablet by mouth at bedtime as needed for mild  constipation. 03/15/17  Yes Enid Derry, [place], DO  thiamine 100 MG tablet Take 1 tablet (100 mg total) by mouth daily. Patient taking differently: Take 100 mg by mouth daily with breakfast.  03/16/17  Yes Enid Derry, [place], DO  oxyCODONE (ROXICODONE) 5 MG immediate release tablet Take 1 tablet (5 mg total) by mouth every 4 (four) hours as needed for severe pain. Patient not taking: Reported on 05/13/2017 05/06/17   Guadalupe Dawn, MD  polyethylene glycol Boyd / Floria Raveling) packet Take 17 g by mouth daily as needed for mild constipation. 05/05/17   Kathrene Alu, MD    Allergies Diphenhydramine; Tramadol hcl; and Morphine and related   REVIEW OF SYSTEMS  Negative except as noted here or in the History of Present Illness.   PHYSICAL EXAMINATION  Initial Vital Signs Blood pressure 116/60, pulse 66, temperature 98.1 F (36.7 C), temperature source Oral, resp. rate 16, height 5\' 6"  (1.676 m), weight 51.3 kg (113 lb), SpO2 96 %.  Examination General: Well-developed, well-nourished female in no acute distress; appearance consistent with age of record HENT: normocephalic;  atraumatic Eyes: Right pupil round and reactive to light; left pupil irregular and nonreactive, lens implant present; extraocular muscles intact Neck: supple Heart: regular rate and rhythm Lungs: clear to auscultation bilaterally Chest: Vas-Cath right upper chest Abdomen: soft; nondistended; nontender; bowel sounds present GU: Indwelling Foley with gross blood in Foley bag Extremities: Right AKA Neurologic: Awake, alert and oriented; motor function intact in all extremities and symmetric; no facial droop Skin: Warm and dry Psychiatric: Normal mood and affect   RESULTS  Summary of this visit's results, reviewed by myself:   EKG Interpretation  Date/Time:    Ventricular Rate:    PR Interval:    QRS Duration:   QT Interval:    QTC Calculation:   R Axis:     Text Interpretation:        Laboratory Studies: Results for orders placed or performed during the hospital encounter of 05/13/17 (from the past 24 hour(s))  Urinalysis, Routine w reflex microscopic     Status: Abnormal   Collection Time: 05/13/17  5:50 AM  Result Value Ref Range   Color, Urine BROWN (A) YELLOW   APPearance CLOUDY (A) CLEAR   Specific Gravity, Urine 1.016 1.005 - 1.030   pH 7.0 5.0 - 8.0   Glucose, UA 50 (A) NEGATIVE mg/dL   Hgb urine dipstick LARGE (A) NEGATIVE   Bilirubin Urine NEGATIVE NEGATIVE   Ketones, ur 5 (A) NEGATIVE mg/dL   Protein, ur 100 (A) NEGATIVE mg/dL   Nitrite NEGATIVE NEGATIVE   Leukocytes, UA SMALL (A) NEGATIVE   RBC / HPF TOO NUMEROUS TO COUNT 0 - 5 RBC/hpf   WBC, UA 6-30 0 - 5 WBC/hpf   Bacteria, UA MANY (A) NONE SEEN   Squamous Epithelial / LPF 6-30 (A) NONE SEEN   Imaging Studies: No results found.  ED COURSE  Nursing notes and initial vitals signs, including pulse oximetry, reviewed.  Vitals:   05/13/17 0459 05/13/17 0715  BP: 116/60 125/63  Pulse: 66 (!) 58  Resp: 16 16  Temp: 98.1 F (36.7 C)   TempSrc: Oral   SpO2: 96% 96%  Weight: 51.3 kg (113 lb)     Height: 5\' 6"  (1.676 m)    7:22 AM Differential diagnosis includes bleeding due to her known bladder tumor, bleeding due to irritation by her Foley catheter and/or acute urinary tract infection.  Urinalysis has been held up by lab backup.  Urine  culture is pending.  We will go ahead and treat for catheter related infection and have her follow-up with urology.  She is due for dialysis today and will need to advise them that antibiotics may need to be repeated after dialysis.  PROCEDURES    ED DIAGNOSES     ICD-10-CM   1. Gross hematuria R31.0        Nahum Sherrer, Jenny Reichmann, MD 05/13/17 [phone]

## 2017-05-13 NOTE — ED Notes (Signed)
Pt was dx with bladder cancer in November Pt not receiving any treatment for cancer yet, she's states that she's never had blood in catheter before and woke up this am with blood in bag

## 2017-05-13 NOTE — ED Notes (Signed)
Pt had a recent MI and released from the hospital last week

## 2017-05-13 NOTE — ED Notes (Signed)
Pts foley was easily irrigated. Some clots drained from the foley after irrigation. There is a foul odor while irrigating the foley. Color is a deep red.

## 2017-05-16 LAB — URINE CULTURE

## 2017-05-29 ENCOUNTER — Other Ambulatory Visit: Payer: Self-pay | Admitting: Family Medicine

## 2017-06-01 ENCOUNTER — Other Ambulatory Visit: Payer: Self-pay | Admitting: Family Medicine

## 2017-06-04 ENCOUNTER — Other Ambulatory Visit: Payer: Self-pay | Admitting: Family Medicine

## 2017-06-16 ENCOUNTER — Ambulatory Visit (INDEPENDENT_AMBULATORY_CARE_PROVIDER_SITE_OTHER): Payer: Medicaid Other | Admitting: Cardiology

## 2017-06-16 ENCOUNTER — Encounter: Payer: Self-pay | Admitting: Cardiology

## 2017-06-16 ENCOUNTER — Other Ambulatory Visit: Payer: Self-pay | Admitting: Family Medicine

## 2017-06-16 VITALS — BP 110/60 | HR 71 | Ht 66.0 in

## 2017-06-16 DIAGNOSIS — C679 Malignant neoplasm of bladder, unspecified: Secondary | ICD-10-CM | POA: Diagnosis not present

## 2017-06-16 DIAGNOSIS — I10 Essential (primary) hypertension: Secondary | ICD-10-CM

## 2017-06-16 DIAGNOSIS — I2510 Atherosclerotic heart disease of native coronary artery without angina pectoris: Secondary | ICD-10-CM

## 2017-06-16 DIAGNOSIS — E785 Hyperlipidemia, unspecified: Secondary | ICD-10-CM

## 2017-06-16 NOTE — Patient Instructions (Signed)
Medication Instructions:  Your physician recommends that you continue on your current medications as directed. Please refer to the Current Medication list given to you today.  If you need a refill on your cardiac medications, please contact your pharmacy first.  Labwork: Your physician recommends that you return for lab work in: 1 week for liver function and fasting lipids.   Testing/Procedures: None ordered   Follow-Up: Your physician wants you to follow-up in: 6 month with PA. You will receive a reminder letter in the mail two months in advance. If you don't receive a letter, please call our office to schedule the follow-up appointment.  Your physician wants you to follow-up in: 1 year with Dr. Radford Pax. You will receive a reminder letter in the mail two months in advance. If you don't receive a letter, please call our office to schedule the follow-up appointment.  Any Other Special Instructions Will Be Listed Below (If Applicable).   Thank you for choosing [facility], RN  [phone]  If you need a refill on your cardiac medications before your next appointment, please call your pharmacy.

## 2017-06-16 NOTE — Progress Notes (Signed)
Cardiology Office Note:    Date:  06/19/2017   ID:  Zafiro Routson, DOB [birth year], MRN [id number]  PCP:  Kerin Perna, NP  Cardiologist:  Fransico Him, MD    Referring MD: Kerin Perna, NP   Chief Complaint  Patient presents with  . Coronary Artery Disease  . Hypertension  . Congestive Heart Failure  . Hyperlipidemia    History of Present Illness:    Brandi Arellano is a 61 y.o. female with a hx of ASCAD s/p remote CABG and subsequent NTSTEMI 04/07/2017 with cath 12./2018 with PCI of an 80-85% ostial OMIA graft stenosis.  It was recommended that if she have further sx then could consider atherectomy of the LAD.  She also has a history of chronic diastolic CHF, DM, ESRD on HD, HTN and hyperlipidemia and was recently admitted to  [facility] in January after admission for N/V and was found to have a troponin elevated to 6.27.  It was felt that she was not a good candidate for cath due to her chronic medical problems and medical therapy was recommended.   2D echo at that time showed mildly reduced LVF with EF 40-45% with diffuse HK and mild to moderate AS.  According to notes, she underwent TURBT in late fall 2018 and it was suggested to have a cystectomy and bilateral nephrectomy if felt to be a surgical candidate. From cardiac standpoint it was felt that her overall prognosis was poor. Palliative care was consulted by primary to address her goals of care but patient and family were adamant that they wanted to proceed with aggressive rx of her CA and did not want Palliative Care.  She is seeing Dr. Jeffie Pollock back in the near future.   She is here today for followup and is doing well from a cardiac standpoint.  She denies any chest pain or pressure, SOB, DOE, PND, orthopnea, LE edema, dizziness, palpitations or syncope. She is compliant with her meds and is tolerating meds with no SE.    Past Medical History:  Diagnosis Date  . Anemia   . Bladder mass 02/09/2017  . Bladder tumor  03/01/2017  . Buerger's disease ([place]) 02/08/2014  . CAD (coronary artery disease)    CABG  . CHF (congestive heart failure) (HCC)    Acute on chronic diastolic  . Complication of anesthesia   . Conjunctivitis 08/04/2015  . Diabetes mellitus without complication ([place])    Type II  . DM type 2, uncontrolled, with renal complications ([place]) 8/84/1660  . Dupuytren's contracture of left hand 05/07/2016  . DVT (deep venous thrombosis) ([place])   . ESRD (end stage renal disease) ([place])    HD M-W-F  . ESRD needing dialysis ([place]) 12/14/2013  . Gangrene associated with diabetes mellitus ([place]) 03/20/2014  . Gangrene of lower extremity (HCC)    RLE  . GERD (gastroesophageal reflux disease)   . Hx of AKA (above knee amputation), right ([place])   . Hyperlipidemia   . Hypertension   . Hypothyroidism   . Insomnia 07/09/2015  . 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 PONV (postoperative nausea and vomiting)   . S/P dialysis catheter insertion ([place])   . Stable angina pectoris ([place])   . Status post above knee amputation, right ([place]) 02/08/2014  . Stroke [facility])    "light stroke" no deficits  . Thrombocytopenia ([place])   . Thyroid disease    Abnormal Thyroid function Test  . UTI (lower urinary tract infection)  Past Surgical History:  Procedure Laterality Date  . ABDOMINAL ANGIOGRAM  12/26/2013   Procedure: ABDOMINAL ANGIOGRAM;  Surgeon: Serafina Scheff, MD;  Location: [facility] CATH LAB;  Service: Cardiovascular;;  . AMPUTATION Right 02/01/2014   Procedure: Right Below Knee Amputation;  Surgeon: Newt Minion, MD;  Location: [place];  Service: Orthopedics;  Laterality: Right;  . AMPUTATION Right 03/24/2014   Procedure: AMPUTATION ABOVE KNEE;  Surgeon: Newt Minion, MD;  Location: [place];  Service: Orthopedics;  Laterality: Right;  . ARCH AORTOGRAM  12/26/2013   Procedure: ARCH AORTOGRAM;  Surgeon: Serafina Nowling, MD;  Location: [facility] CATH LAB;  Service: Cardiovascular;;  . AXILLARY  ARTERY - BRACHIAL ARTERY BYPASS GRAFT  11/07/2013   At [place]     Bilateral  . CHOLECYSTECTOMY    . CORONARY ARTERY BYPASS GRAFT  2012   in [place], [place] N/A 04/07/2017   Procedure: CORONARY STENT INTERVENTION;  Surgeon: Troy Sine, MD;  Location: [place] CV LAB;  Service: Cardiovascular;  Laterality: N/A;  . CYSTOSCOPY W/ RETROGRADES Left 03/01/2017   Procedure: CYSTOSCOPY WITH RETROGRADE PYELOGRAM;  Surgeon: Irine Seal, MD;  [facility];  Service: Urology;  Laterality: Left;  . FOOT AMPUTATION Bilateral   . HARDWARE REMOVAL Right 07/19/2014   Procedure: Removal Deep Hardware Right Femur;  Surgeon: Newt Minion, MD;  Location: [place];  Service: Orthopedics;  Laterality: Right;  Six screws and one condylar plate removed   . hemodialysis catheter    . INSERTION OF DIALYSIS CATHETER Right 02/07/2017   Procedure: INSERTION OF DIALYSIS CATHETER;  Surgeon: Rosetta Posner, MD;  Location: [place];  Service: Vascular;  Laterality: Right;  . LEFT HEART CATH AND CORONARY ANGIOGRAPHY N/A 04/06/2017   Procedure: LEFT HEART CATH AND CORONARY ANGIOGRAPHY;  Surgeon: Troy Sine, MD;  Location: [place] CV LAB;  Service: Cardiovascular;  Laterality: N/A;  . LEFT HEART CATH AND CORS/GRAFTS ANGIOGRAPHY N/A 02/11/2017   Procedure: LEFT HEART CATH AND CORS/GRAFTS ANGIOGRAPHY;  Surgeon: Nelva Bush, MD;  Location: [place] CV LAB;  Service: Cardiovascular;  Laterality: N/A;  . LOWER EXTREMITY ANGIOGRAM N/A 12/26/2013   Procedure: LOWER EXTREMITY ANGIOGRAM;  Surgeon: Serafina Thurman, MD;  Location: [facility] CATH LAB;  Service: Cardiovascular;  Laterality: N/A;  . TRANSURETHRAL RESECTION OF BLADDER TUMOR N/A 03/01/2017   Procedure: TRANSURETHRAL RESECTION OF LARGE BLADDER TUMOR (TURBT)FROM BLADDER NECK;  Surgeon: Irine Seal, MD;  [facility];  Service: Urology;  Laterality: N/A;  . UPPER EXTREMITY ANGIOGRAM Right 12/26/2013    Procedure: UPPER EXTREMITY ANGIOGRAM;  Surgeon: Serafina Locy, MD;  Location: [facility] CATH LAB;  Service: Cardiovascular;  Laterality: Right;    Current Medications: Current Meds  Medication Sig  . amLODipine (NORVASC) 10 MG tablet Take 10 mg by mouth daily.  Marland Kitchen aspirin EC 81 MG tablet Take 81 mg by mouth daily with breakfast.   . atorvastatin (LIPITOR) 40 MG tablet Take 40 mg by mouth at bedtime.   . clopidogrel (PLAVIX) 75 MG tablet Take 1 tablet (75 mg total) by mouth daily with breakfast.  . lanthanum (FOSRENOL) 1000 MG chewable tablet Chew 1,000 mg by mouth See admin instructions. Crush and sprinkle over food one tablet (1000 mg) three times daily with meals  . levothyroxine (SYNTHROID, LEVOTHROID) 25 MCG tablet Take 25 mcg by mouth daily with breakfast.   . metoprolol succinate (TOPROL-XL) 50 MG 24 hr tablet Take 1 tablet (  50 mg total) by mouth daily. Take with or immediately following a meal.  . oxyCODONE (OXY IR/ROXICODONE) 5 MG immediate release tablet Take 1 tablet (5 mg total) by mouth every 4 (four) hours as needed for moderate pain.  . polyethylene glycol (MIRALAX / GLYCOLAX) packet Take 17 g by mouth daily as needed for mild constipation.  . senna-docusate (SENOKOT-S) 8.6-50 MG tablet Take 1 tablet by mouth at bedtime as needed for mild constipation.  . thiamine 100 MG tablet Take 1 tablet (100 mg total) by mouth daily.     Allergies:   Diphenhydramine; Tramadol hcl; and Morphine and related   Social History   Socioeconomic History  . Marital status: Divorced    Spouse name: None  . Number of children: None  . Years of education: None  . Highest education level: None  Social Needs  . Financial resource strain: None  . Food insecurity - worry: None  . Food insecurity - inability: None  . Transportation needs - medical: None  . Transportation needs - non-medical: None  Occupational History  . None  Tobacco Use  . Smoking status: Former Smoker    Years: 30.00    Types:  Cigarettes  . Smokeless tobacco: Never Used  . Tobacco comment: quit in 2002  Substance and Sexual Activity  . Alcohol use: No  . Drug use: No  . Sexual activity: None  Other Topics Concern  . None  Social History Narrative   Pt currently living with her brother.   Independent w/ transfers bed>wheelchair     Family History: The patient's family history includes Heart disease in her father; Hypertension in her father. She was adopted.  ROS:   Please see the history of present illness.    ROS  All other systems reviewed and negative.   EKGs/Labs/Other Studies Reviewed:    The following studies were reviewed today: Cath 03/2017  EKG:  EKG is not ordered today.    Recent Labs: 05/01/2017: TSH 9.707 05/02/2017: B Natriuretic Peptide >4,500.0 05/03/2017: ALT 11 05/04/2017: Hemoglobin 9.0; Platelets 149 05/06/2017: BUN 38; Creatinine, Ser 5.81; Potassium 4.4; Sodium 135   Recent Lipid Panel No results found for: CHOL, TRIG, HDL, CHOLHDL, VLDL, LDLCALC, LDLDIRECT  Physical Exam:    VS:  BP 110/60 (BP Location: Left Arm, Patient Position: Sitting, Cuff Size: Normal)   Pulse 71   Ht 5\' 6"  (1.676 m)   SpO2 92%   BMI 18.24 kg/m     Wt Readings from Last 3 Encounters:  05/13/17 113 lb (51.3 kg)  05/06/17 115 lb 11.9 oz (52.5 kg)  04/18/17 117 lb (53.1 kg)     GEN:  Well nourished, well developed in no acute distress HEENT: Normal NECK: No JVD; No carotid bruits LYMPHATICS: No lymphadenopathy CARDIAC: RRR, no murmurs, rubs, gallops RESPIRATORY:  Clear to auscultation without rales, wheezing or rhonchi  ABDOMEN: Soft, non-tender, non-distended MUSCULOSKELETAL:  No edema; No deformity  SKIN: Warm and dry NEUROLOGIC:  Alert and oriented x 3 PSYCHIATRIC:  Normal affect   ASSESSMENT:    1. Coronary artery disease involving native coronary artery of native heart without angina pectoris   2. Hyperlipidemia, unspecified hyperlipidemia type   3. Essential hypertension   4.  Bladder carcinoma ([place])    PLAN:    In order of problems listed above:  1.  ASCAD -  s/p remote CABG and subsequent NTSTEMI 04/07/2017 with cath 03/2017 with PCI of an 80-85% ostial OMIA graft stenosis.  It was recommended  that if she have further sx then could consider atherectomy of the LAD.  She has not had an anginal sx since her MI.  She will continue on ASA 81mg  daily, Plavix 75mg  daily, Toprol and statin.   2.  Hyperlipidemia with LDL goal < 70.  She will continue on Atorvastatin 40mg  daily.  I will get an FLP and ALT.    3.  HTN - BP is well controlled on exam today.  She will continue on amlodipine 10mg  daily and Toprol 50mg  daily.    4.  Bladder CA - at last hospitalization Palliative Care was consulted but patient was adamant that she wanted to be treated aggressively for her CA.  Given her comorbidities of ESRD, DM, CAD and CHF, her revised cardiac risk score is high at 15% for cardiac arrest, cardiac arrhythmias, MI or CHF.  Also, she is on Plavix for recent NSTEMI and had PCI and therefore increased risk of acute stent thrombosis if Plavix is held in the first 6-9 months.     Medication Adjustments/Labs and Tests Ordered: Current medicines are reviewed at length with the patient today.  Concerns regarding medicines are outlined above.  Orders Placed This Encounter  Procedures  . Hepatic function panel  . Lipid panel   No orders of the defined types were placed in this encounter.   Signed, Fransico Him, MD  06/19/2017 10:12 AM    [place]

## 2017-06-22 ENCOUNTER — Other Ambulatory Visit: Payer: Self-pay | Admitting: Family Medicine

## 2017-06-23 ENCOUNTER — Other Ambulatory Visit: Payer: Medicaid Other

## 2017-06-23 ENCOUNTER — Other Ambulatory Visit: Payer: Self-pay | Admitting: Family Medicine

## 2017-06-23 DIAGNOSIS — E785 Hyperlipidemia, unspecified: Secondary | ICD-10-CM

## 2017-06-23 LAB — LIPID PANEL
CHOLESTEROL TOTAL: 156 mg/dL (ref 100–199)
Chol/HDL Ratio: 2.9 ratio (ref 0.0–4.4)
HDL: 54 mg/dL (ref >39)
LDL Calculated: 72 mg/dL (ref 0–99)
TRIGLYCERIDES: 149 mg/dL (ref 0–149)
VLDL Cholesterol Cal: 30 mg/dL (ref 5–40)

## 2017-06-23 LAB — HEPATIC FUNCTION PANEL
ALK PHOS: 95 IU/L (ref 39–117)
ALT: 7 IU/L (ref 0–32)
AST: 10 IU/L (ref 0–40)
Albumin: 3.7 g/dL (ref 3.6–4.8)
Bilirubin Total: 0.5 mg/dL (ref 0.0–1.2)
Bilirubin, Direct: 0.17 mg/dL (ref 0.00–0.40)
Total Protein: 7.5 g/dL (ref 6.0–8.5)

## 2017-06-25 ENCOUNTER — Other Ambulatory Visit: Payer: Self-pay | Admitting: Family Medicine

## 2017-07-21 ENCOUNTER — Ambulatory Visit (HOSPITAL_COMMUNITY)
Admission: RE | Admit: 2017-07-21 | Discharge: 2017-07-21 | Disposition: A | Discharge status: Home / Self Care | Payer: Medicaid Other | Source: Ambulatory Visit | Attending: Urology | Admitting: Urology

## 2017-07-21 ENCOUNTER — Other Ambulatory Visit: Payer: Self-pay | Admitting: Urology

## 2017-07-21 DIAGNOSIS — J90 Pleural effusion, not elsewhere classified: Secondary | ICD-10-CM | POA: Insufficient documentation

## 2017-07-21 DIAGNOSIS — I517 Cardiomegaly: Secondary | ICD-10-CM | POA: Diagnosis not present

## 2017-07-21 DIAGNOSIS — C678 Malignant neoplasm of overlapping sites of bladder: Secondary | ICD-10-CM | POA: Insufficient documentation

## 2017-07-27 ENCOUNTER — Emergency Department (HOSPITAL_COMMUNITY)
Admission: EM | Admit: 2017-07-27 | Discharge: 2017-07-27 | Disposition: A | Discharge status: Home / Self Care | Payer: Medicaid Other | Attending: Emergency Medicine | Admitting: Emergency Medicine

## 2017-07-27 ENCOUNTER — Other Ambulatory Visit: Payer: Self-pay

## 2017-07-27 DIAGNOSIS — I509 Heart failure, unspecified: Secondary | ICD-10-CM | POA: Insufficient documentation

## 2017-07-27 DIAGNOSIS — R319 Hematuria, unspecified: Secondary | ICD-10-CM | POA: Insufficient documentation

## 2017-07-27 DIAGNOSIS — Z7902 Long term (current) use of antithrombotics/antiplatelets: Secondary | ICD-10-CM | POA: Diagnosis not present

## 2017-07-27 DIAGNOSIS — Z7982 Long term (current) use of aspirin: Secondary | ICD-10-CM | POA: Diagnosis not present

## 2017-07-27 DIAGNOSIS — Z87891 Personal history of nicotine dependence: Secondary | ICD-10-CM | POA: Insufficient documentation

## 2017-07-27 DIAGNOSIS — I132 Hypertensive heart and chronic kidney disease with heart failure and with stage 5 chronic kidney disease, or end stage renal disease: Secondary | ICD-10-CM | POA: Insufficient documentation

## 2017-07-27 DIAGNOSIS — E039 Hypothyroidism, unspecified: Secondary | ICD-10-CM | POA: Insufficient documentation

## 2017-07-27 DIAGNOSIS — N186 End stage renal disease: Secondary | ICD-10-CM | POA: Insufficient documentation

## 2017-07-27 DIAGNOSIS — I2510 Atherosclerotic heart disease of native coronary artery without angina pectoris: Secondary | ICD-10-CM | POA: Insufficient documentation

## 2017-07-27 DIAGNOSIS — E1122 Type 2 diabetes mellitus with diabetic chronic kidney disease: Secondary | ICD-10-CM | POA: Insufficient documentation

## 2017-07-27 DIAGNOSIS — Z951 Presence of aortocoronary bypass graft: Secondary | ICD-10-CM | POA: Diagnosis not present

## 2017-07-27 DIAGNOSIS — Z79899 Other long term (current) drug therapy: Secondary | ICD-10-CM | POA: Diagnosis not present

## 2017-07-27 LAB — BASIC METABOLIC PANEL
ANION GAP: 11 (ref 5–15)
BUN: 5 mg/dL — ABNORMAL LOW (ref 6–20)
CALCIUM: 8.4 mg/dL — AB (ref 8.9–10.3)
CHLORIDE: 97 mmol/L — AB (ref 101–111)
CO2: 29 mmol/L (ref 22–32)
Creatinine, Ser: 1.97 mg/dL — ABNORMAL HIGH (ref 0.44–1.00)
GFR calc non Af Amer: 26 mL/min — ABNORMAL LOW (ref >60)
GFR, EST AFRICAN AMERICAN: 31 mL/min — AB (ref >60)
Glucose, Bld: 173 mg/dL — ABNORMAL HIGH (ref 65–99)
POTASSIUM: 3.9 mmol/L (ref 3.5–5.1)
Sodium: 137 mmol/L (ref 135–145)

## 2017-07-27 LAB — CBC WITH DIFFERENTIAL/PLATELET
BASOS ABS: 0 10*3/uL (ref 0.0–0.1)
BASOS PCT: 0 %
Eosinophils Absolute: 0.1 10*3/uL (ref 0.0–0.7)
Eosinophils Relative: 2 %
HEMATOCRIT: 30.6 % — AB (ref 36.0–46.0)
HEMOGLOBIN: 9.2 g/dL — AB (ref 12.0–15.0)
LYMPHS PCT: 6 %
Lymphs Abs: 0.6 10*3/uL — ABNORMAL LOW (ref 0.7–4.0)
MCH: 27.4 pg (ref 26.0–34.0)
MCHC: 30.1 g/dL (ref 30.0–36.0)
MCV: 91.1 fL (ref 78.0–100.0)
Monocytes Absolute: 0.5 10*3/uL (ref 0.1–1.0)
Monocytes Relative: 5 %
NEUTROS ABS: 7.7 10*3/uL (ref 1.7–7.7)
NEUTROS PCT: 87 %
Platelets: 229 10*3/uL (ref 150–400)
RBC: 3.36 MIL/uL — AB (ref 3.87–5.11)
RDW: 20.3 % — ABNORMAL HIGH (ref 11.5–15.5)
WBC: 8.9 10*3/uL (ref 4.0–10.5)

## 2017-07-27 LAB — URINALYSIS, MICROSCOPIC (REFLEX)

## 2017-07-27 LAB — TYPE AND SCREEN
ABO/RH(D): O POS
ANTIBODY SCREEN: POSITIVE
DAT, IgG: NEGATIVE
PT AG TYPE: NEGATIVE

## 2017-07-27 LAB — I-STAT BETA HCG BLOOD, ED (MC, WL, AP ONLY): HCG, QUANTITATIVE: 20.7 m[IU]/mL — AB (ref <5)

## 2017-07-27 LAB — URINALYSIS, ROUTINE W REFLEX MICROSCOPIC

## 2017-07-27 MED ORDER — LIDOCAINE HCL (PF) 1 % IJ SOLN
INTRAMUSCULAR | Status: AC
  Administered 2017-07-27: 2.1 mL
  Filled 2017-07-27: qty 5

## 2017-07-27 MED ORDER — CEPHALEXIN 500 MG PO CAPS: ORAL_CAPSULE | ORAL | 0 refills | Status: DC

## 2017-07-27 MED ORDER — CEFTRIAXONE SODIUM 1 G IJ SOLR: 1.0000 g | Freq: Once | INTRAMUSCULAR | Status: DC

## 2017-07-27 MED ORDER — SODIUM CHLORIDE 0.9 % IV SOLN
1.0000 g | Freq: Once | INTRAVENOUS | Status: DC
  Filled 2017-07-27: qty 10

## 2017-07-27 MED ORDER — CEFTRIAXONE SODIUM 1 G IJ SOLR
1.0000 g | Freq: Once | INTRAMUSCULAR | Status: AC
  Administered 2017-07-27: 1 g via INTRAMUSCULAR
  Filled 2017-07-27: qty 10

## 2017-07-27 MED ORDER — CEPHALEXIN 500 MG PO CAPS: ORAL_CAPSULE | ORAL | 0 refills | Status: AC

## 2017-07-27 NOTE — ED Notes (Signed)
PT states understanding of care given, follow up care, and medication prescribed. PT ambulated from ED to car with a steady gait. 

## 2017-07-27 NOTE — ED Provider Notes (Signed)
8:15 PM Handoff from [place] PA-C at shift change.   Delay in Rocephin administration 2/2 poor access and patient's nurse needed to attend to another patient in the department.   Unable to obtain IV access. Rocephin changed to IM. Plan per Francee Piccolo, home with keflex 500mg  once daily.  BP (!) 113/58   Pulse 78   Resp 11   Ht 5\' 7"  (1.702 m)   Wt 54.4 kg (120 lb)   SpO2 100%   BMI 18.79 kg/m     Carlisle Cater, PA-C 07/27/17 2017    Sherwood Gambler, MD 07/27/17 2330

## 2017-07-27 NOTE — ED Triage Notes (Signed)
Patient was finishing her dialysis treatment today when she noticed bright red blood coming from her urinary catheter. Patient states that she always has this in because she cannot urinate without it.

## 2017-07-27 NOTE — ED Provider Notes (Signed)
[facility] EMERGENCY DEPARTMENT Provider Note   CSN: [id number] Arrival date & time: 07/27/17  1114     History   Chief Complaint Chief Complaint  Patient presents with  . Vaginal Bleeding    HPI Brandi Arellano is an unfortunat 62 y.o. female who has a past medical history of end-stage renal disease and bladder cancer.  She has a chronic indwelling Foley catheter for bladder obstruction.  Patient has chosen not to pursue chemotherapy or radiation.  She has a CODE STATUS of DNR/DNI.  The patient presents from her dialysis center for acute hematuria.  Patient states that she was getting ready to finish up her dialysis treatment when she lifted the blanket over her legs and noticed that she was bleeding and had soaked through her pants, the pillow sitting underneath her and there was blood on the floor.  She has had her catheter in place for at least 2 months.  Notes in the EMR state that she had it changed about a week before February 2 when she had a cardiology visit.  However patient states she does not remember having it changed since December 2018.  She has had acute hematuria previously.  She denies excessive pain, injury to her bladder or catheter, nausea, vomiting, fevers.   HPI  Past Medical History:  Diagnosis Date  . Anemia   . Bladder mass 02/09/2017  . Bladder tumor 03/01/2017  . Buerger's disease ([place]) 02/08/2014  . CAD (coronary artery disease)    CABG  . CHF (congestive heart failure) (HCC)    Acute on chronic diastolic  . Complication of anesthesia   . Conjunctivitis 08/04/2015  . Diabetes mellitus without complication ([employer])    Type II  . DM type 2, uncontrolled, with renal complications ([place]) 6/73/4193  . Dupuytren's contracture of left hand 05/07/2016  . DVT (deep venous thrombosis) ([place])   . ESRD (end stage renal disease) ([place])    HD M-W-F  . ESRD needing dialysis ([place]) 12/14/2013  . Gangrene associated with diabetes mellitus ([place]) 03/20/2014    . Gangrene of lower extremity (HCC)    RLE  . GERD (gastroesophageal reflux disease)   . Hx of AKA (above knee amputation), right ([place])   . Hyperlipidemia   . Hypertension   . Hypothyroidism   . Insomnia 07/09/2015  . 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 PONV (postoperative nausea and vomiting)   . S/P dialysis catheter insertion ([employer])   . Stable angina pectoris ([place])   . Status post above knee amputation, right ([place]) 02/08/2014  . Stroke [facility])    "light stroke" no deficits  . Thrombocytopenia ([place])   . Thyroid disease    Abnormal Thyroid function Test  . UTI (lower urinary tract infection)     Patient Active Problem List   Diagnosis Date Noted  . Advance care planning   . Hypertensive emergency   . Acute pulmonary edema (HCC)   . NSTEMI (non-ST elevated myocardial infarction) ([place]) 04/05/2017  . Hypoxia 04/04/2017  . Palliative care by specialist   . Acute blood loss anemia   . Bladder carcinoma ([place]) 03/05/2017  . Type 2 diabetes mellitus with chronic kidney disease on chronic dialysis, with long-term current use of insulin ([place])   . Urinary tract infection associated with indwelling urethral catheter ([place])   . Nausea and vomiting   . Abdominal pain   . Abnormal stress test   . Goals of care, counseling/discussion   .  ESRD on hemodialysis ([place])   . Altered mental status 02/07/2017  . Axillary mass, left 07/01/2016  . HLD (hyperlipidemia) 08/04/2015  . Gastroparesis due to DM ([place]) 02/12/2014  . Hyperparathyroidism, secondary renal ([place]) 02/06/2014  . Hyperphosphatemia 02/06/2014  . Anemia due to chronic kidney disease, on chronic dialysis ([place]) 02/06/2014  . Hypertension 12/26/2013  . CKD (chronic kidney disease) 12/26/2013  . Hypothyroidism 12/26/2013  . CAD (coronary artery disease) 12/26/2013    Past Surgical History:  Procedure Laterality Date  . ABDOMINAL ANGIOGRAM  12/26/2013   Procedure: ABDOMINAL ANGIOGRAM;  Surgeon:  Serafina Herard, MD;  Location: [facility] CATH LAB;  Service: Cardiovascular;;  . AMPUTATION Right 02/01/2014   Procedure: Right Below Knee Amputation;  Surgeon: Newt Minion, MD;  Location: [place];  Service: Orthopedics;  Laterality: Right;  . AMPUTATION Right 03/24/2014   Procedure: AMPUTATION ABOVE KNEE;  Surgeon: Newt Minion, MD;  Location: [place];  Service: Orthopedics;  Laterality: Right;  . ARCH AORTOGRAM  12/26/2013   Procedure: ARCH AORTOGRAM;  Surgeon: Serafina Caperton, MD;  Location: [facility] CATH LAB;  Service: Cardiovascular;;  . AXILLARY ARTERY - BRACHIAL ARTERY BYPASS GRAFT  11/07/2013   At [place]     Bilateral  . CHOLECYSTECTOMY    . CORONARY ARTERY BYPASS GRAFT  2012   in [place], [place] N/A 04/07/2017   Procedure: CORONARY STENT INTERVENTION;  Surgeon: Troy Sine, MD;  Location: [place] CV LAB;  Service: Cardiovascular;  Laterality: N/A;  . CYSTOSCOPY W/ RETROGRADES Left 03/01/2017   Procedure: CYSTOSCOPY WITH RETROGRADE PYELOGRAM;  Surgeon: Irine Seal, MD;  [facility];  Service: Urology;  Laterality: Left;  . FOOT AMPUTATION Bilateral   . HARDWARE REMOVAL Right 07/19/2014   Procedure: Removal Deep Hardware Right Femur;  Surgeon: Newt Minion, MD;  Location: [place];  Service: Orthopedics;  Laterality: Right;  Six screws and one condylar plate removed   . hemodialysis catheter    . INSERTION OF DIALYSIS CATHETER Right 02/07/2017   Procedure: INSERTION OF DIALYSIS CATHETER;  Surgeon: Rosetta Posner, MD;  Location: [place];  Service: Vascular;  Laterality: Right;  . LEFT HEART CATH AND CORONARY ANGIOGRAPHY N/A 04/06/2017   Procedure: LEFT HEART CATH AND CORONARY ANGIOGRAPHY;  Surgeon: Troy Sine, MD;  Location: [place] CV LAB;  Service: Cardiovascular;  Laterality: N/A;  . LEFT HEART CATH AND CORS/GRAFTS ANGIOGRAPHY N/A 02/11/2017   Procedure: LEFT HEART CATH AND CORS/GRAFTS ANGIOGRAPHY;  Surgeon: Nelva Bush, MD;  Location: [place] CV LAB;  Service: Cardiovascular;  Laterality: N/A;  . LOWER EXTREMITY ANGIOGRAM N/A 12/26/2013   Procedure: LOWER EXTREMITY ANGIOGRAM;  Surgeon: Serafina Steward, MD;  Location[facility] CATH LAB;  Service: Cardiovascular;  Laterality: N/A;  . TRANSURETHRAL RESECTION OF BLADDER TUMOR N/A 03/01/2017   Procedure: TRANSURETHRAL RESECTION OF LARGE BLADDER TUMOR (TURBT)FROM BLADDER NECK;  Surgeon: Irine Seal, MD;  Location: [facility];  Service: Urology;  Laterality: N/A;  . UPPER EXTREMITY ANGIOGRAM Right 12/26/2013   Procedure: UPPER EXTREMITY ANGIOGRAM;  Surgeon: Serafina Kirby, MD;  Location: [facility] CATH LAB;  Service: Cardiovascular;  Laterality: Right;     OB History    Gravida  3   Para  3   Term      Preterm      AB      Living        SAB      TAB  Ectopic      Multiple      Live Births               Home Medications    Prior to Admission medications   Medication Sig Start Date End Date Taking? Authorizing Provider  amLODipine (NORVASC) 10 MG tablet Take 10 mg by mouth daily. 06/12/17   [provider]  aspirin EC 81 MG tablet Take 81 mg by mouth daily with breakfast.     [provider]  atorvastatin (LIPITOR) 40 MG tablet Take 40 mg by mouth at bedtime.     [provider]  clopidogrel (PLAVIX) 75 MG tablet Take 1 tablet (75 mg total) by mouth daily with breakfast. 04/12/17   [place] Bing, DO  lanthanum (FOSRENOL) 1000 MG chewable tablet Chew 1,000 mg by mouth See admin instructions. Crush and sprinkle over food one tablet (1000 mg) three times daily with meals    [provider]  levothyroxine (SYNTHROID, LEVOTHROID) 25 MCG tablet Take 25 mcg by mouth daily with breakfast.     [provider]  metoprolol succinate (TOPROL-XL) 50 MG 24 hr tablet Take 1 tablet (50 mg total) by mouth daily. Take with or immediately following a meal. 05/05/17   Winfrey, Alcario Drought, MD  oxyCODONE (OXY  IR/ROXICODONE) 5 MG immediate release tablet Take 1 tablet (5 mg total) by mouth every 4 (four) hours as needed for moderate pain. 05/05/17   Kathrene Alu, MD  polyethylene glycol (MIRALAX / GLYCOLAX) packet Take 17 g by mouth daily as needed for mild constipation. 05/05/17   Kathrene Alu, MD  senna-docusate (SENOKOT-S) 8.6-50 MG tablet Take 1 tablet by mouth at bedtime as needed for mild constipation. 03/15/17   Shirley, [place], DO  thiamine 100 MG tablet Take 1 tablet (100 mg total) by mouth daily. 03/16/17   Shirley, [place], DO    Family History Family History  Adopted: Yes  Problem Relation Age of Onset  . Hypertension Father   . Heart disease Father        Coronary Artery Disease    Social History Social History   Tobacco Use  . Smoking status: Former Smoker    Years: 30.00    Types: Cigarettes  . Smokeless tobacco: Never Used  . Tobacco comment: quit in 2002  Substance Use Topics  . Alcohol use: No  . Drug use: No     Allergies   Diphenhydramine; Tramadol hcl; and Morphine and related   Review of Systems Review of Systems Ten systems reviewed and are negative for acute change, except as noted in the HPI.    Physical Exam Updated Vital Signs BP 93/61 (BP Location: Left Arm)   Pulse 80   Resp 17   Ht 5\' 7"  (1.702 m)   Wt 54.4 kg (120 lb)   SpO2 96%   BMI 18.79 kg/m   Physical Exam  Constitutional: She is oriented to person, place, and time. She appears well-developed and well-nourished. No distress.  Patient appears very pale.  Overwhelming gangrenous smell, suspect necrotic tumor.   HENT:  Head: Normocephalic and atraumatic.  Eyes: Conjunctivae are normal. No scleral icterus.  Neck: Normal range of motion.  Cardiovascular: Normal rate, regular rhythm and normal heart sounds. Exam reveals no gallop and no friction rub.  No murmur heard. Pulmonary/Chest: Effort normal and breath sounds normal. No respiratory distress.  Abdominal: Soft. Bowel sounds  are normal. She exhibits no distension and no mass. There is no  tenderness. There is no guarding.  Genitourinary:  Genitourinary Comments: Blood around the vulva and medial thighs. Blood on the catheter  Neurological: She is alert and oriented to person, place, and time.  Skin: Skin is warm and dry. She is not diaphoretic.  Psychiatric: Her behavior is normal.  Nursing note and vitals reviewed.    ED Treatments / Results  Labs (all labs ordered are listed, but only abnormal results are displayed) Labs Reviewed  I-STAT BETA HCG BLOOD, ED ([facility], [facility], [facility] ONLY)    EKG None  Radiology No results found.  Procedures Procedures (including critical care time)  Medications Ordered in ED Medications - No data to display   Initial Impression / Assessment and Plan / ED Course  I have reviewed the triage vital signs and the nursing notes.  Pertinent labs & imaging results that were available during my care of the patient were reviewed by me and considered in my medical decision making (see chart for details).  Clinical Course as of Jul 27 1417  Wed Jul 27, 2017  1235 I STAT hcg ordered prior to my evaluation. This test is unnecessary and I would not have ordered it. I do not think this positive point of care test is valid.  I-stat hCG, quantitative(!): 20.7 [AH]    Clinical Course User Index [AH] Margarita Mail, PA-C    Patient here with gross hematuria.  She has a known bladder cancer and is on Plavix.  There is a pungent odor of necrotic tissue and I suspect this is coming from her bladder mass.  This is likely the source of her bleeding.  Her urine is grossly contaminated with blood and difficult to assess however we will treat for suspected infection with Rocephin and she will be discharged with Keflex.  Her IV antibiotics are still running.  Her catheter has been changed and replaced with another indwelling catheter.  She is advised to follow very closely with her urologist.  She  appears appropriate for discharge when her any body aches are finished.  I have given sign out to PA [place] who will disposition the patient.  Final Clinical Impressions(s) / ED Diagnoses   Final diagnoses:  None    ED Discharge Orders    None       Margarita Mail, PA-C 07/27/17 1648    Julianne Rice, MD 07/28/17 1544

## 2017-07-27 NOTE — Discharge Instructions (Signed)
Contact a health care provider if: °You develop back pain. °You have a fever. °You have a feeling of sickness in your stomach (nausea) or vomiting. °Your symptoms are not better in 3 days. Return sooner if you are getting worse. °Get help right away if: °You develop severe vomiting and are unable to keep the medicine down. °You develop severe back or abdominal pain despite taking your medicines. °You begin passing a large amount of blood or clots in your urine. °You feel extremely weak or faint, or you pass out. °

## 2017-07-29 LAB — URINE CULTURE: Culture: >=100000 — AB

## 2017-07-31 ENCOUNTER — Telehealth: Payer: Self-pay

## 2017-07-31 NOTE — Telephone Encounter (Signed)
calle dfor symptom check from ED 07/27/17  NO further problems. See Urologist and stop Keflex

## 2017-07-31 NOTE — Progress Notes (Signed)
ED Antimicrobial Stewardship Positive Culture Follow Up   Brandi Arellano is an 62 y.o. female who presented to [facility] on 07/27/2017 with a chief complaint of  Chief Complaint  Patient presents with  . Vaginal Bleeding    Recent Results (from the past 720 hour(s))  Urine Culture     Status: Abnormal   Collection Time: 07/27/17  3:30 PM  Result Value Ref Range Status   Specimen Description URINE, CATHETERIZED  Final   Special Requests   Final    NONE Performed at [facility], [address], [place], [place] [address]    Culture >=100,000 COLONIES/mL ENTEROCOCCUS FAECALIS (A)  Final   Report Status 07/29/2017 FINAL  Final   Organism ID, Bacteria ENTEROCOCCUS FAECALIS (A)  Final      Susceptibility   Enterococcus faecalis - MIC*    AMPICILLIN <=2 SENSITIVE Sensitive     LEVOFLOXACIN 2 SENSITIVE Sensitive     NITROFURANTOIN <=16 SENSITIVE Sensitive     VANCOMYCIN 1 SENSITIVE Sensitive     * >=100,000 COLONIES/mL ENTEROCOCCUS FAECALIS    [x]  Treated with Keflex, organism resistant to prescribed antimicrobial []  Patient discharged originally without antimicrobial agent and treatment is now indicated  New antibiotic prescription: Follow-up with patient for symptom resolution, if no better patient should follow-up with urologist and therapy changed to Amoxicillin 500mg  QD  ED Provider: Arlean Hopping PA-C   [place] 07/31/2017, 12:54 PM

## 2017-11-10 DEATH — deceased
